# Patient Record
Sex: Female | Born: 1955
Health system: Southern US, Community
[De-identification: ages and names within clinical notes are randomized; demographics above are authoritative.]

## PROBLEM LIST (undated history)

## (undated) DIAGNOSIS — K760 Fatty (change of) liver, not elsewhere classified: Secondary | ICD-10-CM

## (undated) DIAGNOSIS — Z9889 Other specified postprocedural states: Secondary | ICD-10-CM

## (undated) DIAGNOSIS — E669 Obesity, unspecified: Secondary | ICD-10-CM

## (undated) DIAGNOSIS — K2970 Gastritis, unspecified, without bleeding: Secondary | ICD-10-CM

## (undated) DIAGNOSIS — E119 Type 2 diabetes mellitus without complications: Secondary | ICD-10-CM

## (undated) DIAGNOSIS — K859 Acute pancreatitis without necrosis or infection, unspecified: Secondary | ICD-10-CM

## (undated) DIAGNOSIS — F32A Depression, unspecified: Secondary | ICD-10-CM

## (undated) DIAGNOSIS — M069 Rheumatoid arthritis, unspecified: Secondary | ICD-10-CM

## (undated) DIAGNOSIS — I10 Essential (primary) hypertension: Secondary | ICD-10-CM

## (undated) DIAGNOSIS — G629 Polyneuropathy, unspecified: Secondary | ICD-10-CM

## (undated) DIAGNOSIS — K3184 Gastroparesis: Secondary | ICD-10-CM

## (undated) DIAGNOSIS — K219 Gastro-esophageal reflux disease without esophagitis: Secondary | ICD-10-CM

## (undated) DIAGNOSIS — F329 Major depressive disorder, single episode, unspecified: Secondary | ICD-10-CM

## (undated) DIAGNOSIS — I639 Cerebral infarction, unspecified: Secondary | ICD-10-CM

## (undated) DIAGNOSIS — Z8673 Personal history of transient ischemic attack (TIA), and cerebral infarction without residual deficits: Secondary | ICD-10-CM

## (undated) DIAGNOSIS — F419 Anxiety disorder, unspecified: Secondary | ICD-10-CM

## (undated) DIAGNOSIS — K449 Diaphragmatic hernia without obstruction or gangrene: Secondary | ICD-10-CM

## (undated) DIAGNOSIS — E785 Hyperlipidemia, unspecified: Secondary | ICD-10-CM

## (undated) DIAGNOSIS — M76829 Posterior tibial tendinitis, unspecified leg: Secondary | ICD-10-CM

## (undated) DIAGNOSIS — Z87442 Personal history of urinary calculi: Secondary | ICD-10-CM

## (undated) HISTORY — DX: Diaphragmatic hernia without obstruction or gangrene: K44.9

## (undated) HISTORY — DX: Obesity, unspecified: E66.9

## (undated) HISTORY — DX: Hyperlipidemia, unspecified: E78.5

## (undated) HISTORY — PX: LITHOTRIPSY: SUR834

## (undated) HISTORY — DX: Gastritis, unspecified, without bleeding: K29.70

## (undated) HISTORY — PX: CHOLECYSTECTOMY: SHX55

## (undated) HISTORY — DX: Type 2 diabetes mellitus without complications: E11.9

## (undated) HISTORY — DX: Cerebral infarction, unspecified: I63.9

## (undated) HISTORY — PX: TUBAL LIGATION: SHX77

## (undated) HISTORY — DX: Personal history of transient ischemic attack (TIA), and cerebral infarction without residual deficits: Z86.73

## (undated) HISTORY — DX: Essential (primary) hypertension: I10

## (undated) HISTORY — DX: Acute pancreatitis without necrosis or infection, unspecified: K85.90

## (undated) HISTORY — DX: Major depressive disorder, single episode, unspecified: F32.9

## (undated) HISTORY — DX: Posterior tibial tendinitis, unspecified leg: M76.829

## (undated) HISTORY — DX: Gastroparesis: K31.84

## (undated) HISTORY — DX: Depression, unspecified: F32.A

## (undated) HISTORY — DX: Gastro-esophageal reflux disease without esophagitis: K21.9

## (undated) HISTORY — PX: CARPAL TUNNEL RELEASE: SHX101

## (undated) HISTORY — DX: Fatty (change of) liver, not elsewhere classified: K76.0

## (undated) HISTORY — PX: ANKLE SURGERY: SHX546

## (undated) HISTORY — PX: DILATION AND CURETTAGE OF UTERUS: SHX78

## (undated) HISTORY — PX: LASER ABLATION OF THE CERVIX: SHX1949

## (undated) HISTORY — DX: Anxiety disorder, unspecified: F41.9

## (undated) HISTORY — DX: Rheumatoid arthritis, unspecified: M06.9

## (undated) HISTORY — PX: FINGER SURGERY: SHX640

## (undated) HISTORY — DX: Other specified postprocedural states: Z98.890

---

## 2001-02-18 ENCOUNTER — Ambulatory Visit (HOSPITAL_BASED_OUTPATIENT_CLINIC_OR_DEPARTMENT_OTHER): Admission: RE | Admit: 2001-02-18 | Discharge: 2001-02-18 | Payer: Self-pay | Admitting: Orthopedic Surgery

## 2001-07-15 ENCOUNTER — Encounter: Payer: Self-pay | Admitting: Family Medicine

## 2001-07-15 ENCOUNTER — Ambulatory Visit (HOSPITAL_COMMUNITY): Admission: RE | Admit: 2001-07-15 | Discharge: 2001-07-15 | Payer: Self-pay | Admitting: Family Medicine

## 2001-08-15 ENCOUNTER — Ambulatory Visit (HOSPITAL_COMMUNITY): Admission: RE | Admit: 2001-08-15 | Discharge: 2001-08-15 | Payer: Self-pay | Admitting: *Deleted

## 2002-02-26 ENCOUNTER — Other Ambulatory Visit: Admission: RE | Admit: 2002-02-26 | Discharge: 2002-02-26 | Payer: Self-pay | Admitting: *Deleted

## 2002-09-04 ENCOUNTER — Ambulatory Visit (HOSPITAL_COMMUNITY): Admission: RE | Admit: 2002-09-04 | Discharge: 2002-09-04 | Payer: Self-pay | Admitting: Family Medicine

## 2002-09-04 ENCOUNTER — Encounter: Payer: Self-pay | Admitting: Family Medicine

## 2002-10-05 ENCOUNTER — Observation Stay (HOSPITAL_COMMUNITY): Admission: EM | Admit: 2002-10-05 | Discharge: 2002-10-06 | Payer: Self-pay | Admitting: Cardiology

## 2002-10-05 ENCOUNTER — Encounter: Payer: Self-pay | Admitting: Emergency Medicine

## 2003-05-18 LAB — HM DIABETES EYE EXAM

## 2003-06-18 LAB — HM DIABETES EYE EXAM

## 2003-09-06 ENCOUNTER — Encounter: Payer: Self-pay | Admitting: Family Medicine

## 2003-09-06 ENCOUNTER — Ambulatory Visit (HOSPITAL_COMMUNITY): Admission: RE | Admit: 2003-09-06 | Discharge: 2003-09-06 | Payer: Self-pay | Admitting: Family Medicine

## 2003-09-16 ENCOUNTER — Ambulatory Visit (HOSPITAL_COMMUNITY): Admission: RE | Admit: 2003-09-16 | Discharge: 2003-09-16 | Payer: Self-pay | Admitting: Family Medicine

## 2004-07-24 ENCOUNTER — Encounter (INDEPENDENT_AMBULATORY_CARE_PROVIDER_SITE_OTHER): Payer: Self-pay | Admitting: *Deleted

## 2004-07-24 ENCOUNTER — Emergency Department (HOSPITAL_COMMUNITY): Admission: EM | Admit: 2004-07-24 | Discharge: 2004-07-25 | Payer: Self-pay | Admitting: Emergency Medicine

## 2005-08-27 ENCOUNTER — Ambulatory Visit (HOSPITAL_COMMUNITY): Admission: RE | Admit: 2005-08-27 | Discharge: 2005-08-27 | Payer: Self-pay | Admitting: Family Medicine

## 2005-12-20 ENCOUNTER — Ambulatory Visit (HOSPITAL_COMMUNITY): Admission: RE | Admit: 2005-12-20 | Discharge: 2005-12-20 | Payer: Self-pay | Admitting: Family Medicine

## 2005-12-20 ENCOUNTER — Encounter (INDEPENDENT_AMBULATORY_CARE_PROVIDER_SITE_OTHER): Payer: Self-pay | Admitting: *Deleted

## 2006-09-16 ENCOUNTER — Ambulatory Visit (HOSPITAL_COMMUNITY): Admission: RE | Admit: 2006-09-16 | Discharge: 2006-09-16 | Payer: Self-pay | Admitting: Family Medicine

## 2006-11-19 DIAGNOSIS — Z8673 Personal history of transient ischemic attack (TIA), and cerebral infarction without residual deficits: Secondary | ICD-10-CM

## 2006-11-19 DIAGNOSIS — I639 Cerebral infarction, unspecified: Secondary | ICD-10-CM

## 2006-11-19 HISTORY — DX: Personal history of transient ischemic attack (TIA), and cerebral infarction without residual deficits: Z86.73

## 2006-11-19 HISTORY — DX: Cerebral infarction, unspecified: I63.9

## 2006-12-27 ENCOUNTER — Ambulatory Visit (HOSPITAL_COMMUNITY): Admission: RE | Admit: 2006-12-27 | Discharge: 2006-12-27 | Payer: Self-pay | Admitting: Obstetrics & Gynecology

## 2007-04-15 ENCOUNTER — Ambulatory Visit (HOSPITAL_COMMUNITY): Admission: RE | Admit: 2007-04-15 | Discharge: 2007-04-15 | Payer: Self-pay | Admitting: Family Medicine

## 2007-04-21 ENCOUNTER — Ambulatory Visit (HOSPITAL_COMMUNITY): Admission: RE | Admit: 2007-04-21 | Discharge: 2007-04-21 | Payer: Self-pay | Admitting: Family Medicine

## 2008-06-17 ENCOUNTER — Ambulatory Visit (HOSPITAL_COMMUNITY): Admission: RE | Admit: 2008-06-17 | Discharge: 2008-06-17 | Payer: Self-pay | Admitting: Family Medicine

## 2008-06-17 ENCOUNTER — Encounter: Payer: Self-pay | Admitting: Orthopedic Surgery

## 2008-07-12 ENCOUNTER — Ambulatory Visit: Payer: Self-pay | Admitting: Orthopedic Surgery

## 2008-07-12 DIAGNOSIS — M76829 Posterior tibial tendinitis, unspecified leg: Secondary | ICD-10-CM | POA: Insufficient documentation

## 2008-07-15 ENCOUNTER — Encounter: Payer: Self-pay | Admitting: Orthopedic Surgery

## 2008-07-15 ENCOUNTER — Encounter (HOSPITAL_COMMUNITY): Admission: RE | Admit: 2008-07-15 | Discharge: 2008-08-14 | Payer: Self-pay | Admitting: Orthopedic Surgery

## 2008-08-20 ENCOUNTER — Ambulatory Visit (HOSPITAL_COMMUNITY): Admission: RE | Admit: 2008-08-20 | Discharge: 2008-08-20 | Payer: Self-pay | Admitting: Family Medicine

## 2008-12-01 ENCOUNTER — Encounter: Payer: Self-pay | Admitting: Orthopedic Surgery

## 2009-03-16 ENCOUNTER — Ambulatory Visit (HOSPITAL_COMMUNITY): Admission: RE | Admit: 2009-03-16 | Discharge: 2009-03-16 | Payer: Self-pay | Admitting: Family Medicine

## 2009-04-16 ENCOUNTER — Emergency Department (HOSPITAL_COMMUNITY): Admission: EM | Admit: 2009-04-16 | Discharge: 2009-04-16 | Payer: Self-pay | Admitting: Family Medicine

## 2009-04-27 ENCOUNTER — Encounter: Payer: Self-pay | Admitting: Internal Medicine

## 2009-04-27 ENCOUNTER — Ambulatory Visit (HOSPITAL_COMMUNITY): Admission: RE | Admit: 2009-04-27 | Discharge: 2009-04-27 | Payer: Self-pay | Admitting: Family Medicine

## 2009-06-10 ENCOUNTER — Encounter: Payer: Self-pay | Admitting: Internal Medicine

## 2009-07-15 DIAGNOSIS — F411 Generalized anxiety disorder: Secondary | ICD-10-CM | POA: Insufficient documentation

## 2009-07-15 DIAGNOSIS — K760 Fatty (change of) liver, not elsewhere classified: Secondary | ICD-10-CM | POA: Insufficient documentation

## 2009-07-15 DIAGNOSIS — J45909 Unspecified asthma, uncomplicated: Secondary | ICD-10-CM

## 2009-07-15 DIAGNOSIS — K449 Diaphragmatic hernia without obstruction or gangrene: Secondary | ICD-10-CM | POA: Insufficient documentation

## 2009-07-15 DIAGNOSIS — Z8719 Personal history of other diseases of the digestive system: Secondary | ICD-10-CM | POA: Insufficient documentation

## 2009-07-15 DIAGNOSIS — E1159 Type 2 diabetes mellitus with other circulatory complications: Secondary | ICD-10-CM | POA: Insufficient documentation

## 2009-07-15 DIAGNOSIS — F419 Anxiety disorder, unspecified: Secondary | ICD-10-CM | POA: Insufficient documentation

## 2009-07-15 DIAGNOSIS — E785 Hyperlipidemia, unspecified: Secondary | ICD-10-CM | POA: Insufficient documentation

## 2009-07-15 DIAGNOSIS — R1310 Dysphagia, unspecified: Secondary | ICD-10-CM | POA: Insufficient documentation

## 2009-07-15 DIAGNOSIS — I10 Essential (primary) hypertension: Secondary | ICD-10-CM | POA: Insufficient documentation

## 2009-07-15 DIAGNOSIS — F329 Major depressive disorder, single episode, unspecified: Secondary | ICD-10-CM

## 2009-07-19 ENCOUNTER — Ambulatory Visit: Payer: Self-pay | Admitting: Internal Medicine

## 2009-07-19 DIAGNOSIS — E1143 Type 2 diabetes mellitus with diabetic autonomic (poly)neuropathy: Secondary | ICD-10-CM | POA: Insufficient documentation

## 2009-07-19 DIAGNOSIS — K3184 Gastroparesis: Secondary | ICD-10-CM | POA: Insufficient documentation

## 2009-07-27 ENCOUNTER — Ambulatory Visit (HOSPITAL_COMMUNITY): Admission: RE | Admit: 2009-07-27 | Discharge: 2009-07-27 | Payer: Self-pay | Admitting: Internal Medicine

## 2009-07-27 HISTORY — PX: OTHER SURGICAL HISTORY: SHX169

## 2009-07-29 ENCOUNTER — Ambulatory Visit: Payer: Self-pay | Admitting: Internal Medicine

## 2009-07-29 ENCOUNTER — Encounter: Payer: Self-pay | Admitting: Internal Medicine

## 2009-07-29 HISTORY — PX: ESOPHAGOGASTRODUODENOSCOPY: SHX1529

## 2009-08-01 ENCOUNTER — Encounter: Payer: Self-pay | Admitting: Internal Medicine

## 2009-08-24 ENCOUNTER — Ambulatory Visit (HOSPITAL_COMMUNITY): Admission: RE | Admit: 2009-08-24 | Discharge: 2009-08-24 | Payer: Self-pay | Admitting: Family Medicine

## 2009-09-09 ENCOUNTER — Emergency Department (HOSPITAL_COMMUNITY): Admission: EM | Admit: 2009-09-09 | Discharge: 2009-09-09 | Payer: Self-pay | Admitting: Emergency Medicine

## 2009-09-28 ENCOUNTER — Ambulatory Visit: Payer: Self-pay | Admitting: Orthopedic Surgery

## 2009-09-28 DIAGNOSIS — M7150 Other bursitis, not elsewhere classified, unspecified site: Secondary | ICD-10-CM

## 2009-09-29 ENCOUNTER — Encounter: Payer: Self-pay | Admitting: Orthopedic Surgery

## 2009-12-20 ENCOUNTER — Ambulatory Visit: Admission: RE | Admit: 2009-12-20 | Discharge: 2009-12-20 | Payer: Self-pay | Admitting: Family Medicine

## 2010-01-18 ENCOUNTER — Encounter: Admission: RE | Admit: 2010-01-18 | Discharge: 2010-04-18 | Payer: Self-pay | Admitting: Family Medicine

## 2010-01-26 ENCOUNTER — Encounter: Admission: RE | Admit: 2010-01-26 | Discharge: 2010-01-26 | Payer: Self-pay | Admitting: Endocrinology

## 2010-07-19 ENCOUNTER — Ambulatory Visit: Payer: Self-pay | Admitting: Orthopedic Surgery

## 2010-07-19 DIAGNOSIS — M6530 Trigger finger, unspecified finger: Secondary | ICD-10-CM | POA: Insufficient documentation

## 2010-07-20 ENCOUNTER — Encounter: Payer: Self-pay | Admitting: Orthopedic Surgery

## 2010-08-17 ENCOUNTER — Emergency Department (HOSPITAL_COMMUNITY): Admission: EM | Admit: 2010-08-17 | Discharge: 2010-08-17 | Payer: Self-pay | Admitting: Family Medicine

## 2010-08-28 ENCOUNTER — Ambulatory Visit (HOSPITAL_COMMUNITY): Admission: RE | Admit: 2010-08-28 | Discharge: 2010-08-28 | Payer: Self-pay | Admitting: Family Medicine

## 2010-11-04 ENCOUNTER — Emergency Department (HOSPITAL_COMMUNITY)
Admission: EM | Admit: 2010-11-04 | Discharge: 2010-11-04 | Payer: Self-pay | Source: Home / Self Care | Admitting: Emergency Medicine

## 2010-12-09 ENCOUNTER — Encounter: Payer: Self-pay | Admitting: Family Medicine

## 2010-12-10 ENCOUNTER — Encounter: Payer: Self-pay | Admitting: Family Medicine

## 2010-12-18 ENCOUNTER — Encounter: Payer: Self-pay | Admitting: Cardiology

## 2010-12-18 ENCOUNTER — Ambulatory Visit (HOSPITAL_COMMUNITY)
Admission: RE | Admit: 2010-12-18 | Discharge: 2010-12-18 | Payer: Self-pay | Source: Home / Self Care | Attending: Family Medicine | Admitting: Family Medicine

## 2010-12-18 ENCOUNTER — Ambulatory Visit: Admit: 2010-12-18 | Payer: Self-pay | Admitting: Endocrinology

## 2010-12-19 ENCOUNTER — Encounter: Payer: Self-pay | Admitting: Cardiology

## 2010-12-19 ENCOUNTER — Ambulatory Visit
Admission: RE | Admit: 2010-12-19 | Discharge: 2010-12-19 | Payer: Self-pay | Source: Home / Self Care | Attending: Cardiology | Admitting: Cardiology

## 2010-12-21 NOTE — Letter (Signed)
Summary: Medication list  Medication list   Imported By: Jacklynn Ganong 07/20/2010 14:20:20  _____________________________________________________________________  External Attachment:    Type:   Image     Comment:   External Document

## 2010-12-21 NOTE — Assessment & Plan Note (Signed)
Summary: req injection left middle trigger finger/umr/bsf   Visit Type:  Follow-up Referring Provider:  [name], MD Primary Provider:  [name], MD  CC:  trigger finger left middle finger.  History of Present Illness: I saw Jamie Harding in the office today for a followup visit.  She is a 55 years old woman with the complaint of:  left middle finger, trigger.treated last year for triggering with injection.  Presents back with pain in triggering of the LEFT long finger.  Tenderness is noted over the A1 pulley with painful range of motion.  We injected the area of the A1 pulley.  You have received an injection of cortisone today. You may experience increased pain at the injection site. Apply ice pack to the area for 20 minutes every 2 hours and take 2 xtra strength tylenol every 8 hours. This increased pain will usually resolve in 24 hours. The injection will take effect in 3-10 days.    Allergies: 1)  ! Morphine 2)  ! Sulfa 3)  ! Zithromax 4)  ! Naprosyn   Impression & Recommendations:  Problem # 1:  TRIGGER FINGER (ICD-727.3) Assessment Deteriorated  Other Orders: Est. Patient Level III (60454) Injection, Tendon / Ligament (09811) Depo- Medrol 40mg  (J1030)  Patient Instructions: 1)  You have received an injection of cortisone today. You may experience increased pain at the injection site. Apply ice pack to the area for 20 minutes every 2 hours and take 2 xtra strength tylenol every 8 hours. This increased pain will usually resolve in 24 hours. The injection will take effect in 3-10 days. ' 2)  Please schedule a follow-up appointment as needed.

## 2010-12-26 ENCOUNTER — Telehealth (INDEPENDENT_AMBULATORY_CARE_PROVIDER_SITE_OTHER): Payer: Self-pay | Admitting: *Deleted

## 2010-12-27 ENCOUNTER — Ambulatory Visit (HOSPITAL_BASED_OUTPATIENT_CLINIC_OR_DEPARTMENT_OTHER): Payer: 59

## 2010-12-27 ENCOUNTER — Ambulatory Visit (HOSPITAL_COMMUNITY): Payer: 59 | Attending: Internal Medicine

## 2010-12-27 ENCOUNTER — Encounter: Payer: Self-pay | Admitting: Internal Medicine

## 2010-12-27 DIAGNOSIS — R072 Precordial pain: Secondary | ICD-10-CM

## 2010-12-27 DIAGNOSIS — R0602 Shortness of breath: Secondary | ICD-10-CM

## 2010-12-27 DIAGNOSIS — R079 Chest pain, unspecified: Secondary | ICD-10-CM | POA: Insufficient documentation

## 2010-12-27 NOTE — Assessment & Plan Note (Signed)
Summary: np6   Visit Type:  Initial Consult Referring Provider:  [name], MD Primary Provider:  [name], MD  CC:  chest pain.  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  When she is working she feels fine.  She is overweight.  She has been working at [employer] and increasing her exercise.  She has some shortness of breath with this, but no chest pain.  The patient has had some GI symptoms in the past.  Dr. Lina Sar for GI evaluation.  Her emptying study did not show significant gastroparesis.  Endoscopy only showed minimal gastritis.  The etiology of her symptoms in the past were not clear. This workup was done in September, 2010.  Over time she felt somewhat better.  More recently she has had more difficulties.  She now has symptoms that occur at nighttime.  She describes an episode of awakening with nausea and sweating.  She may then feel some increased heart rate and some chest pain.  She uses Phenergan.  She frequently has vomited.  She had a fairly significant episode in the past week.  She is now referred for further cardiac evaluation to be sure that there is not a cardiac component to these symptoms.  There is a history of hypertension and diabetes. She is significantly overweight.  She says that she had some type of stroke in 2008.  There is a family history with a sibling having a coronary stent at a young age.  Current Medications (verified): 1)  Nexium 40 Mg Cpdr (Esomeprazole Magnesium) .... Take 1 Tablet By Mouth Once A Day 2)  Alprazolam 0.5 Mg Tabs (Alprazolam) .... One Tablet By Mouth Two Times A Day As Needed 3)  Lisinopril 20 Mg Tabs (Lisinopril) .... One Tablet By Mouth Once Daily 4)  Pravastatin Sodium 80 Mg Tabs (Pravastatin Sodium) .... Take One Tablet By Mouth Daily At Bedtime 5)  Glyburide Micronized 6 Mg Tabs (Glyburide Micronized) .... One Tablet By Mouth Two Times A Day 6)   Metformin Hcl 1000 Mg Tabs (Metformin Hcl) .... One Tablet By Mouth Two Times A Day 7)  Furosemide 20 Mg Tabs (Furosemide) .... One Tablet By Mouth Once Daily 8)  Aspirin Ec 325 Mg Tbec (Aspirin) .... Take One Tablet By Mouth Daily 9)  Multivitamins   Tabs (Multiple Vitamin) .... One Tablet By Mouth Daily 10)  Promethazine Hcl 25 Mg Tabs (Promethazine Hcl) .... One Tablet By Mouth Two Times A Day As Needed 11)  Lantus 100 Unit/ml Soln (Insulin Glargine) .... 25 Units At At Bedtime 12)  Methocarbamol 500 Mg Tabs (Methocarbamol) .... Take 1 Tablet By Mouth Once A Day 13)  Ondansetron Hcl 8 Mg Tabs (Ondansetron Hcl) .... Take 1 Tablet By Mouth Once A Day 14)  Amlodipine Besylate 5 Mg Tabs (Amlodipine Besylate) .... Take One Tablet By Mouth Daily 15)  Novolog 100 Unit/ml Soln (Insulin Aspart) .... Ssi, Three Times A Day With Meals 16)  Venlafaxine Hcl 75 Mg Tabs (Venlafaxine Hcl) .... Take 1 Tablet By Mouth Once A Day  Allergies (verified): 1)  ! Morphine 2)  ! Sulfa 3)  ! Zithromax 4)  ! Naprosyn  Past History:  Past Medical History: diabetes stroke htn obese depression anxiety HYPERLIPIDEMIA HIATAL HERNIA  ASTHMA, UNSPECIFIED DYSPHAGIA UNSPECIFIED FATTY LIVER DISEASE GASTRITIS, HX OF PANCREATITIS, HX OF TIBIALIS TENDINITIS  HX, FAMILY, ASTHMA FAMILY HISTORY OF ARTHRITIS  FAMILY HISTORY CORONARY HEART  DISEASE FEMALE < 65  FAMILY HISTORY OF DIABETES Chest pain, nausea, vomiting, sweating,    January, 2012... episodes occur in the middle of the night  Family History: Reviewed history from 07/19/2009 and no changes required. FH of Cancer: Breast Cancer: Mother  Family History of Diabetes: Father Family History Coronary Heart Disease female < 10 Family History of Arthritis Hx, family, asthma No FH of Colon Cancer: Family History of Diabetes: Sister   Social History: Reviewed history from 07/19/2009 and no changes required. Patient is married.  nurse tech Illicit Drug  Use - no Patient is a former smoker.  Alcohol Use - no Daily Caffeine Use: 2 daily  Patient does not get regular exercise.   Review of Systems       Patient denies fever, chills, headache, change in vision, change in hearing, cough, urinary symptoms.  All other systems are reviewed and are negative.  Vital Signs:  Patient profile:   55 year old female Weight:      235 pounds BMI:     43.14 Pulse rate:   68 / minute BP sitting:   138 / 84  (left arm)  Vitals Entered By: Hardin Negus, RMA (December 19, 2010 2:06 PM)  Physical Exam  General:  patient is overweight. She is stable today and not having any symptoms at this time. Head:  head is atraumatic. Eyes:  no xanthelasma. Neck:  no jugular venous distention. Chest Wall:  no chest wall tenderness. Lungs:  lungs are clear.  Respiratory effort is nonlabored. Heart:  cardiac exam reveals S1-S2.  No clicks or significant murmurs. Abdomen:  abdomen is soft. Msk:  no musculoskeletal deformities. Extremities:  no peripheral edema. Skin:  no skin rash. Psych:  patient is oriented to person time and place.  Affect is normal.   Impression & Recommendations:  Problem # 1:  * CHEST PAIN, NAUSEA, VOMITING, SWEATING  The etiology of these episodes is not clear.  The symptoms certainly are concerning.  They occur in the middle of night.  She does not have exertional symptoms.  The possibility of rest ischemia will be kept in mind, but there is no definite proof.  She has many risk factors.  EKG is done today and reviewed by me.  It is normal.  The patient is on aspirin.  I decided not to change her other medications.  We will proceed with an echo to fully assess LV function and valvular function and a pharmacologic stress nuclear scan.  In the meantime she will continue full activities.  I will then see her back for followup.  We will give her sublingual nitroglycerin to try with one of these episodes this evening has any  effect.  Orders: Nuclear Stress Test (Nuc Stress Test) Echocardiogram (Echo)  Problem # 2:  HYPERLIPIDEMIA (ICD-272.4) Lipids are being treated.  Problem # 3:  HYPERTENSION (ICD-401.9) Patient's blood pressure is being treated.  No change in therapy.  Other Orders: EKG w/ Interpretation (93000)  Patient Instructions: 1)  Your physician has requested that you have an echocardiogram.  Echocardiography is a painless test that uses sound waves to create images of your heart. It provides your doctor with information about the size and shape of your heart and how well your heart's chambers and valves are working.  This procedure takes approximately one hour. There are no restrictions for this procedure. 2)  Your physician has requested that you have a Lexiscan myoview.  For further information please visit [email or web address]  Please  follow instruction sheet, as given. 3)  Follow up in 1 week

## 2011-01-01 ENCOUNTER — Encounter: Payer: Self-pay | Admitting: Cardiology

## 2011-01-01 ENCOUNTER — Ambulatory Visit (INDEPENDENT_AMBULATORY_CARE_PROVIDER_SITE_OTHER): Payer: 59 | Admitting: Cardiology

## 2011-01-01 ENCOUNTER — Ambulatory Visit (HOSPITAL_COMMUNITY): Payer: 59 | Attending: Cardiology

## 2011-01-01 DIAGNOSIS — R0989 Other specified symptoms and signs involving the circulatory and respiratory systems: Secondary | ICD-10-CM

## 2011-01-01 DIAGNOSIS — R072 Precordial pain: Secondary | ICD-10-CM

## 2011-01-02 ENCOUNTER — Ambulatory Visit: Payer: Self-pay | Admitting: Endocrinology

## 2011-01-04 NOTE — Progress Notes (Signed)
Summary: nuc pre procedure  Phone Note Outgoing Call Call back at cell [phone]   Call placed by: Cathlyn Parsons RN,  December 26, 2010 2:36 PM Call placed to: Patient Reason for Call: Confirm/change Appt Summary of Call: Left message with information on Myoview Information Sheet (see scanned document for details).      Nuclear Med Background Indications for Stress Test: Evaluation for Ischemia   History: Asthma  History Comments: ? Chemo  Symptoms: Chest Pain, Diaphoresis, DOE, Nausea, Rapid HR, Vomiting  Symptoms Comments: CP during the night.   Nuclear Pre-Procedure Cardiac Risk Factors: Carotid Disease, CVA, Family History - CAD, Hypertension, IDDM Type 2, Lipids, Obesity Height (in): 62

## 2011-01-10 NOTE — Assessment & Plan Note (Addendum)
Summary: Cardiology Nuclear Testing  Nuclear Med Background Indications for Stress Test: Evaluation for Ischemia   History: Asthma  History Comments: ? Chemo  Symptoms: Chest Pain, Diaphoresis, DOE, Nausea, Palpitations, Rapid HR, Vomiting  Symptoms Comments: CP during the night.   Nuclear Pre-Procedure Cardiac Risk Factors: Carotid Disease, CVA, Family History - CAD, Hypertension, IDDM Type 2, Lipids, Obesity Caffeine/Decaff Intake: none NPO After: 10:30 PM Lungs: clear IV 0.9% NS with Angio Cath: 22g     IV Site: L Wrist IV Started by: Milana Na, EMT-P Chest Size (in) 44     Cup Size DD     Height (in): 62 Weight (lb): 235 BMI: 43.14 Tech Comments: CBG 125mg /dl  Nuclear Med Study 1 or 2 day study:  2 day     Stress Test Type:  Eugenie Birks Reading MD:  Arvilla Meres, MD     Referring MD:  J.Katz Resting Radionuclide:  Technetium 30m Tetrofosmin     Resting Radionuclide Dose:  33.0 mCi  Stress Radionuclide:  Technetium 53m Tetrofosmin     Stress Radionuclide Dose:  33.0 mCi   Stress Protocol  Max Systolic BP: 151 mm Hg Lexiscan: 0.4 mg   Stress Test Technologist:  Milana Na, EMT-P     Nuclear Technologist:  Doyne Keel, CNMT  Rest Procedure  Myocardial perfusion imaging was performed at rest 45 minutes following the intravenous administration of Technetium 13m Tetrofosmin.  Stress Procedure  The patient received IV Lexiscan 0.4 mg over 15-seconds.  Technetium 19m Tetrofosmin injected at 30-seconds.  There were non specific changes and occ pvcs with infusion.  Quantitative spect images were obtained after a 45 minute delay.  QPS Raw Data Images:  There is a breast shadow that accounts for the anterior attenuation. Stress Images:  There is decreased uptake in the anterior wall. Rest Images:  There is decreased uptake in the anterior wall. Subtraction (SDS):  There is a primarily fixed anterior defect that is most consistent with breast  attenuation. Transient Ischemic Dilatation:  0.91  (Normal <1.22)  Lung/Heart Ratio:  0.34  (Normal <0.45)  Quantitative Gated Spect Images QGS EDV:  72 ml QGS ESV:  18 ml QGS EF:  74 % QGS cine images:  Normal  Findings Normal nuclear study      Overall Impression  Exercise Capacity: Lexiscan with no exercise. ECG Impression: No significant ST segment change with Lexiscan. Overall Impression: Normal stress nuclear study. Overall Impression Comments: There is a primarily fixed anterior defect that is most consistent with breast attenuation which is very prominent on raw images.  Appended Document: Cardiology Nuclear Testing pt aware at [facility] 2/13

## 2011-01-10 NOTE — Assessment & Plan Note (Signed)
Summary: f1wk ok per heather/ f/u stress/echo/saf   Visit Type:  Follow-up Referring Provider:  [name], MD Primary Provider:  [name], MD  CC:  chest pain.  History of Present Illness: The patient returns for followup of her symptoms that are complex.  He continues to have spells that occur during the night with nausea.  Sometimes there is sweating and vomiting.  She then has chest discomfort.  This can then persist for several hours and the day. Patient has had an attack of unclear study since her last visit.  The echo reveals normal left ventricular function.  There are no significant valvular abnormalities.  Her nuclear scan reveals significant breast attenuation but there is no definite scar or ischemia.  Current Medications (verified): 1)  Nexium 40 Mg Cpdr (Esomeprazole Magnesium) .... Take 1 Tablet By Mouth Once A Day 2)  Alprazolam 0.5 Mg Tabs (Alprazolam) .... One Tablet By Mouth Two Times A Day As Needed 3)  Lisinopril 20 Mg Tabs (Lisinopril) .... One Tablet By Mouth Once Daily 4)  Pravastatin Sodium 80 Mg Tabs (Pravastatin Sodium) .... Take One Tablet By Mouth Daily At Bedtime 5)  Glyburide Micronized 6 Mg Tabs (Glyburide Micronized) .... One Tablet By Mouth Two Times A Day 6)  Metformin Hcl 1000 Mg Tabs (Metformin Hcl) .... One Tablet By Mouth Two Times A Day 7)  Furosemide 20 Mg Tabs (Furosemide) .... One Tablet By Mouth Once Daily 8)  Aspirin Ec 325 Mg Tbec (Aspirin) .... Take One Tablet By Mouth Daily 9)  Multivitamins   Tabs (Multiple Vitamin) .... One Tablet By Mouth Daily 10)  Promethazine Hcl 25 Mg Tabs (Promethazine Hcl) .... One Tablet By Mouth Two Times A Day As Needed 11)  Lantus 100 Unit/ml Soln (Insulin Glargine) .... 25 Units At At Bedtime 12)  Methocarbamol 500 Mg Tabs (Methocarbamol) .... Take 1 Tablet By Mouth Once A Day 13)  Ondansetron Hcl 8 Mg Tabs (Ondansetron Hcl) .... Take 1 Tablet By Mouth Once A Day 14)  Amlodipine Besylate 5 Mg Tabs  (Amlodipine Besylate) .... Take One Tablet By Mouth Daily 15)  Novolog 100 Unit/ml Soln (Insulin Aspart) .... Ssi, Three Times A Day With Meals 16)  Venlafaxine Hcl 75 Mg Tabs (Venlafaxine Hcl) .... Take 1 Tablet By Mouth Once A Day  Allergies (verified): 1)  ! Morphine 2)  ! Sulfa 3)  ! Zithromax 4)  ! Naprosyn  Past History:  Past Medical History: diabetes stroke htn obese depression anxiety HYPERLIPIDEMIA HIATAL HERNIA  ASTHMA, UNSPECIFIED DYSPHAGIA UNSPECIFIED FATTY LIVER DISEASE GASTRITIS, HX OF PANCREATITIS, HX OF TIBIALIS TENDINITIS  HX, FAMILY, ASTHMA FAMILY HISTORY OF ARTHRITIS  FAMILY HISTORY CORONARY HEART DISEASE FEMALE < 65  FAMILY HISTORY OF DIABETES Chest pain, nausea, vomiting, sweating,    January, 2012... episodes occur in the middle of the night  /  nuclear February 13,2012...Marland KitchenMarland KitchenMarland Kitchenbreast attenuation.... no definite scar or ischemia. EF 60-65%.... echo... February, 2012 [facility]    mild.... echo... February, 2012 are  Review of Systems       Patient denies fever, chills, headache, sweats, rash, change in vision, change in hearing, urinary symptoms.  All other systems are reviewed and are negative.  Vital Signs:  Patient profile:   55 year old female Height:      62 inches Weight:      239 pounds BMI:     43.87 Pulse rate:   85 / minute BP sitting:   132 / 76  (left arm) Cuff size:  regular  Vitals Entered By: Hardin Negus, RMA (January 01, 2011 2:06 PM)  Physical Exam  General:  patient is stable today but has some mild residual chest discomfort. Eyes:  no xanthelasma. Neck:  no jugular venous distention. Chest Wall:  there is no chest wall tenderness. Lungs:  lungs are clear.  Respiratory effort is not labored. Heart:  cardiac exam reveals S1-S2.  No clicks or significant murmurs. Abdomen:  abdomen is soft. Extremities:  no peripheral edema. Psych:  patient is oriented to person time and place.  Affect is normal.   Impression &  Recommendations:  Problem # 1:  * CHEST PAIN, NAUSEA, VOMITING, SWEATING The patient continues to have episodes with nausea in the middle of the night.  At this point there is no proof of cardiac ischemia.  I am concerned about her symptoms.   I have set heronsidered  proceeding with cardiac catheterization. However I'm not convinced that this is the best first approach.  She has seen Dr. Juanda Chance for GI evaluation in 2010.  I will be arranging for fairly early followup in GI to see if Dr.                  Juanda Chance feels that any other GI study should be done at this time..  Problem # 2:  HYPERTENSION (ICD-401.9)  Her updated medication list for this problem includes:    Lisinopril 20 Mg Tabs (Lisinopril) ..... One tablet by mouth once daily    Furosemide 20 Mg Tabs (Furosemide) ..... One tablet by mouth once daily    Aspirin Ec 325 Mg Tbec (Aspirin) .Marland Kitchen... Take one tablet by mouth daily    Amlodipine Besylate 5 Mg Tabs (Amlodipine besylate) .Marland Kitchen... Take one tablet by mouth daily Blood pressure is controlled.  No change in therapy.  Other Orders: Gastroenterology Referral (GI)  Patient Instructions: 1)  You have been referred to follow up with Dr Lina Sar 2)  Follow up with Dr Myrtis Ser in 4 weeks

## 2011-01-27 ENCOUNTER — Encounter: Payer: Self-pay | Admitting: Cardiology

## 2011-01-29 LAB — URINE MICROSCOPIC-ADD ON

## 2011-01-29 LAB — URINALYSIS, ROUTINE W REFLEX MICROSCOPIC
Bilirubin Urine: NEGATIVE
Glucose, UA: >1000 mg/dL — AB
Ketones, ur: NEGATIVE mg/dL
Nitrite: NEGATIVE
Protein, ur: NEGATIVE mg/dL
pH: 5.5 (ref 5.0–8.0)

## 2011-01-29 LAB — URINE CULTURE

## 2011-02-01 LAB — POCT URINALYSIS DIPSTICK
Protein, ur: NEGATIVE mg/dL
Specific Gravity, Urine: 1.015 (ref 1.005–1.030)
pH: 5 (ref 5.0–8.0)

## 2011-02-05 ENCOUNTER — Ambulatory Visit: Payer: 59 | Admitting: Internal Medicine

## 2011-02-09 ENCOUNTER — Ambulatory Visit: Payer: 59 | Admitting: Cardiology

## 2011-02-23 LAB — GLUCOSE, CAPILLARY
Glucose-Capillary: 144 mg/dL — ABNORMAL HIGH (ref 70–99)
Glucose-Capillary: 150 mg/dL — ABNORMAL HIGH (ref 70–99)

## 2011-02-27 LAB — COMPREHENSIVE METABOLIC PANEL
Albumin: 3.6 g/dL (ref 3.5–5.2)
Alkaline Phosphatase: 83 U/L (ref 39–117)
BUN: 10 mg/dL (ref 6–23)
Potassium: 4 mEq/L (ref 3.5–5.1)
Sodium: 137 mEq/L (ref 135–145)
Total Protein: 7.4 g/dL (ref 6.0–8.3)

## 2011-02-27 LAB — POCT URINALYSIS DIP (DEVICE)
Bilirubin Urine: NEGATIVE
Glucose, UA: 500 mg/dL — AB
Hgb urine dipstick: NEGATIVE
Nitrite: NEGATIVE

## 2011-02-27 LAB — DIFFERENTIAL
Basophils Relative: 1 % (ref 0–1)
Lymphs Abs: 3 10*3/uL (ref 0.7–4.0)
Monocytes Absolute: 0.4 10*3/uL (ref 0.1–1.0)
Monocytes Relative: 5 % (ref 3–12)
Neutro Abs: 5.8 10*3/uL (ref 1.7–7.7)

## 2011-02-27 LAB — CBC
HCT: 39.2 % (ref 36.0–46.0)
Platelets: 284 10*3/uL (ref 150–400)
RDW: 12.6 % (ref 11.5–15.5)

## 2011-03-05 ENCOUNTER — Encounter: Payer: Self-pay | Admitting: Orthopedic Surgery

## 2011-03-09 ENCOUNTER — Ambulatory Visit: Payer: 59 | Admitting: Cardiology

## 2011-03-21 ENCOUNTER — Ambulatory Visit (HOSPITAL_COMMUNITY)
Admission: RE | Admit: 2011-03-21 | Discharge: 2011-03-21 | Disposition: A | Discharge status: Home / Self Care | Payer: 59 | Source: Ambulatory Visit | Attending: Orthopedic Surgery | Admitting: Orthopedic Surgery

## 2011-03-21 ENCOUNTER — Ambulatory Visit (INDEPENDENT_AMBULATORY_CARE_PROVIDER_SITE_OTHER): Payer: 59 | Admitting: Orthopedic Surgery

## 2011-03-21 ENCOUNTER — Other Ambulatory Visit: Payer: Self-pay | Admitting: Orthopedic Surgery

## 2011-03-21 DIAGNOSIS — M189 Osteoarthritis of first carpometacarpal joint, unspecified: Secondary | ICD-10-CM

## 2011-03-21 DIAGNOSIS — M79609 Pain in unspecified limb: Secondary | ICD-10-CM | POA: Insufficient documentation

## 2011-03-21 DIAGNOSIS — M6530 Trigger finger, unspecified finger: Secondary | ICD-10-CM

## 2011-03-21 DIAGNOSIS — M1810 Unilateral primary osteoarthritis of first carpometacarpal joint, unspecified hand: Secondary | ICD-10-CM

## 2011-03-21 DIAGNOSIS — M79642 Pain in left hand: Secondary | ICD-10-CM

## 2011-03-21 DIAGNOSIS — M19049 Primary osteoarthritis, unspecified hand: Secondary | ICD-10-CM

## 2011-03-21 MED ORDER — METHYLPREDNISOLONE ACETATE 40 MG/ML IJ SUSP: 40.0000 mg | Freq: Once | INTRAMUSCULAR | Status: DC

## 2011-03-21 MED ORDER — NABUMETONE 500 MG PO TABS: 500.0000 mg | ORAL_TABLET | Freq: Two times a day (BID) | ORAL | Status: AC

## 2011-03-21 NOTE — Progress Notes (Signed)
2 complaints. Established patient new problem pain LEFT thumb recurrent problem pain LEFT long finger. Previous trigger finger injection.  The patient has noted increasing pain in the LEFT thumb with associated trouble opening cans jars. Pain is over the base of the thumb. She also notices that the LEFT long finger is having difficulty closing. No triggering or locking.  Past history as recorded and reviewed.  Exam shows a well-developed, well-nourished, female, slightly overweight. She is oriented x3. Her mood and affect are normal. She has no angulatory problems.  She has tenderness over the A1 pulley of the LEFT long finger with decreased range of motion in flexion, as well as extension. No triggering or locking. Skin is intact.  There is tenderness over the base of the thumb with grinding with compression. There is decreased pinch strength. There is decreased range of motion skin is intact.  Pulse and capillary refill of the hand is normal. There is no lymphadenopathy or lymphangitis. Sensation remains intact and normal. Coordination is normal as well.  Impression tenosynovitis of the LEFT long finger and basal joint arthritis of the carpometacarpal joint of the LEFT thumb.  Recommend injection, LEFT long finger for triggering.  Brace and nabumetone for LEFT carpometacarpal joint arthritis.  Follow up 6 weeks.  X-ray will be done at [facility] and patient will be called with findings.

## 2011-03-21 NOTE — Discharge Summary (Signed)
Injection for triggering: LEFT long finger.  Verbal consent.  Time out.  Alcohol prep with ethyl chloride for anesthesia.  Depo-Medrol 40 mg and lidocaine 1% plain 3 cc injected over the A1 pulley.  No complications

## 2011-03-21 NOTE — Patient Instructions (Signed)
You have received a steroid shot. 15% of patients experience increased pain at the injection site with in the next 24 hours. This is best treated with ice and tylenol extra strength 2 tabs every 8 hours. If you are still having pain please call the office.    

## 2011-03-23 ENCOUNTER — Telehealth: Payer: Self-pay | Admitting: Radiology

## 2011-03-23 NOTE — Telephone Encounter (Signed)
Patient was inquiring about her Xray results of her hand. She can be reached Tuesday at work after 3:00 at [phone].

## 2011-04-06 NOTE — Op Note (Signed)
[facility]  Patient:    Jamie Harding, Jamie Harding                      MRN: [id number] Adm. Date:  [id number] Attending:  Ronne Binning                           Operative Report  PREOPERATIVE DIAGNOSIS:  Carpal tunnel syndrome, right hand.  POSTOPERATIVE DIAGNOSIS:  Carpal tunnel syndrome, right hand.  OPERATION:  Decompression of right median nerve.  SURGEON:  Nicki Reaper, M.D.  ASSISTANT:  Joaquin Courts, R.N.  ANESTHESIA:  Axillary block with supplementation.  ANESTHESIOLOGIST:  Maren Beach, M.D.  HISTORY:  The patient is a 55 year old female with a history of carpal tunnel syndrome -- EMG and nerve conductions positive -- which has not responded to conservative treatment.  PROCEDURE:  The patient was brought to the operating room where an axillary block was carried out without difficulty.  She was prepped and draped using Betadine scrubbing solution with the right arm free.  The limb was exsanguinated with an Esmarch bandage.  Tourniquet placed high on the arm was inflated to 250 mmHg.  A longitudinal incision was made in the palm and carried down through subcutaneous tissue.  Bleeders were electrocauterized. Palmar fascia was split, superficial palmar arch identified, flexor tendon to the ring and little finger identified.  To the ulnar side of the median nerve, carpal retinaculum was incised with sharp dissection.  A right-angle and Sewall retractor were placed between skin and forearm fascia.  The fascia was released for approximately 3 cm proximal to the wrist crease under direct vision.  Canal was explored.  No further lesions were identified. Tenosynovial tissue was moderately thickened.  The wound was irrigated.  Skin was closed with interrupted 5-0 nylon suture.  Sterile compressive dressing and split were applied.  The patient tolerated the procedure well and was taken to the recovery room for observation in satisfactory  condition.  She is discharged home to return to the [facility] in one week, on Vicodin and Keflex. DD:  02/18/01 TD:  02/18/01 Job: [id number]

## 2011-04-06 NOTE — H&P (Signed)
NAME:  Jamie Harding, Jamie Harding                         ACCOUNT NO.:  [id number]   MEDICAL RECORD NO.:  [id number]                   PATIENT TYPE:  INP   LOCATION:  NA                                   FACILITY:  [facility]   PHYSICIAN:  Olga Millers, M.D. [facility]            DATE OF BIRTH:  04-10-1956   DATE OF ADMISSION:  10/05/2002  DATE OF DISCHARGE:                                HISTORY & PHYSICAL   HISTORY OF PRESENT ILLNESS:  The patient is a 55 year old female with past  medical history of hypertension, diabetes mellitus, asthma, and  gastroesophageal reflux disease who presents with chest pain.  The patient  has no prior cardiac history.  This past Friday she developed substernal  chest pain that was described initially as a sharp sensation, then  subsequently has been described as a pressure.  It does not radiate.  There  is occasional nausea with the pain but there is no shortness of breath or  diaphoresis.  The pain is not pleuritic, not related to food.  It does  increase with certain movements including bending over and also certain arm  movements.  Because her symptoms persisted she went to the emergency room at  [facility] and was transferred for further evaluation.   ALLERGIES:  MORPHINE and SULFA.   MEDICATIONS:  1. Zantac 150 mg p.o. q.d.  2. Glucophage 500 mg p.o. q.d.  3. Allegra.  4. Hydrocodone.  5. Acetaminophen p.r.n.  6. Xanax 0.5 mg p.r.n.  7. Provera 10 days of the month.  8. Albuterol.  9. Advair.   SOCIAL HISTORY:  She does not smoke nor does she consume alcohol.   PAST MEDICAL HISTORY:  Significant for diabetes mellitus as well as  hypertension but there is no hyperlipidemia.  She does have a history of  asthma and gastroesophageal reflux disease.  She has had a prior tubal  ligation as well as C sections.  She has had a prior cholecystectomy.   FAMILY HISTORY:  Positive for coronary artery disease.   REVIEW OF SYSTEMS:  She denies any headaches or fevers  or chills.  There is  no productive cough or hemoptysis.  There is no dysphagia, odynophagia,  melena, or hematochezia.  There is dysuria or hematuria.  There is no rash  or seizure activity.  There is no orthopnea, PND, pedal edema.  The  remaining systems are negative.   PHYSICAL EXAMINATION:  VITAL SIGNS:  Blood pressure 138/88, pulse 77.  She  is afebrile at 99.4.  GENERAL:  She is well-developed and somewhat obese.  She is in no acute  distress.  SKIN:  Warm and dry.  HEENT:  Unremarkable with normal eyelids.  NECK:  Supple with a normal upstroke bilaterally and there are no bruits  noted.  There is no jugular venous distention and no thyromegaly noted.  CHEST:  Clear to auscultation with normal expansion.  CARDIOVASCULAR:  Reveals a respiratory rate with normal S1 and S2.  There  are no murmurs, rubs, or gallops noted.  She is tender in the chest area.  ABDOMEN:  Not tender or distended with positive bowel sounds.  No  hepatosplenomegaly and no masses appreciated.  There is no abdominal bruit.  She has 2+ femoral pulses bilaterally and no bruits.  EXTREMITIES:  Show no edema and I can palpate no cords.  She has 2+ dorsalis  pedis pulses bilaterally.  NEUROLOGIC:  Grossly intact.   LABORATORY DATA:  Her electrocardiogram shows a normal sinus rhythm and  there are no acute ST changes.  Her chest x-ray shows no acute disease.   Her white blood cell count is 9.2 with a hemoglobin of 13.4 and hematocrit  39.5, platelet count is 313.  Liver functions are normal.  Sodium 134 with  potassium 4.3.  BUN and creatinine are 11 and 0.8 respectively.  Enzymes:  CK 69, MB 1.3, troponin I normal at 0.03.   DIAGNOSES:  1. Atypical chest pain.  2. Diabetes mellitus.  3. Hypertension.  4. Strong family history of coronary disease.  5. Asthma.  6. History of gastroesophageal reflux disease.   PLAN:  The patient presents with chest pain that is atypical for coronary  disease.  It is most  consistent with musculoskeletal etiology.  Her  electrocardiogram is normal but she does have multiple risk factors.  We  will treat with aspirin and cycle enzymes.  If they are negative then we  will plan to proceed with Cardiolite in the morning for risk stratification.  If they are abnormal then she will require cardiac catheterization.  I will  add Motrin for probable musculoskeletal etiology.  We will also check a D-  dimer although I think pulmonary embolus is unlikely.  She will need to  continue with risk factor modification including weight loss, exercise, and  diet if her nuclear study is normal.                                               Olga Millers, M.D. [facility]    BC/MEDQ  D:  10/05/2002  T:  10/05/2002  Job:  [id number]

## 2011-04-06 NOTE — Discharge Summary (Signed)
NAME:  Jamie Harding, KURODA NO.:  [id number]   MEDICAL RECORD NO.:  [id number]                   PATIENT TYPE:  CINP   LOCATION:                                       FACILITY:  [facility]   PHYSICIAN:  Delton See, P.A. [facility]            DATE OF BIRTH:  1956-09-10   DATE OF ADMISSION:  10/05/2002  DATE OF DISCHARGE:  10/06/2002                           DISCHARGE SUMMARY - REFERRING   HISTORY OF PRESENT ILLNESS:  This is a 55 year old female with a history of  hypertension, diabetes mellitus, asthma and gastroesophageal reflux disease,  who presented for evaluation of chest pain.  The patient has no previous  cardiac history.  She was seen by Dr. Olga Millers after she was  transferred from [facility] Emergency Room for further evaluation.   PAST MEDICAL/SURGICAL HISTORY:  Please see the history as noted above.  As  noted, she does have diabetes mellitus, hypertension, obesity, asthma,  gastroesophageal reflux disease, and she is status post prior tubal ligation  as well as a C-section.  She is status post a cholecystectomy.   ALLERGIES:  MORPHINE AND SULFA.   SOCIAL HISTORY:  The patient does not smoke.  She does not use alcohol.   FAMILY HISTORY:  Positive for coronary artery disease.   HOSPITAL COURSE:  As noted, this patient was admitted to [facility] by Dr. Olga Millers for further evaluation of chest  pain.  Her cardiac enzymes were found to be negative.  A D-dimer was  negative as well.  The patient was scheduled for an adenosine Cardiolite  that was performed on October 06, 2002.  The Cardiolite was read as  negative for ischemia, with an ejection fraction of 68%.  Arrangements were  made to discharge the patient home later that evening in an improved and  stable condition.   LABORATORY DATA:  As noted, cardiac enzymes were negative.  D-dimer was  negative.  A CBC on October 05, 2002, revealed a hemoglobin of  13.4,  hematocrit 39.5, WBC 9200, platelets 313,000.  Chemistries on October 05, 2002, revealed BUN 11, creatinine 0.8, potassium 4.3, sodium 134, glucose  175.  A chest x-ray showed no active disease.  An electrocardiogram showed a normal sinus rhythm, rate 58 beats per minute,  and was essentially a normal electrocardiogram.   DISCHARGE MEDICATIONS:  1. The patient was told to continue the same medications he was on prior to     admission, including Zantac 150 mg q.d.  2. Glucophage 500 mg q.d.  3. Allegra as perviously taken.  4. Hydrocodone as previously taken.  5. Xanax 0.5 mg p.r.n.  6. Provera as previously taken.  7. Albuterol as previously taken.  8. Advair as previously taken.  9. It was also recommended that she take a coated aspirin 81 mg q.d.   DIET:  The patient was told to continue  on her low-salt, low-fat, diabetic  diet.   ACTIVITY:  She was to continue her activity as tolerated.   FOLLOW UP:  She was to call Dr. [name] for an appointment.  She is to  follow up with cardiology as needed.   DISCHARGE DIAGNOSES:  1. Atypical chest pain.  2. Negative adenosine Cardiolite performed on October 06, 2002.  3. Diabetes mellitus.  4. Hypertension.  5. Strong family history of coronary artery disease.  6. History of asthma.  7. History of gastroesophageal reflux disease.                                                 Delton See, P.A. [facility]    DR/MEDQ  D:  10/06/2002  T:  10/06/2002  Job:  [id number]   cc:   Lorin Picket A. Gerda Diss, M.D.  [address]  [place]  [place] [address]  Fax: [phone]

## 2011-04-17 ENCOUNTER — Other Ambulatory Visit: Payer: Self-pay | Admitting: Internal Medicine

## 2011-04-25 NOTE — Telephone Encounter (Signed)
Taken care of

## 2011-05-02 ENCOUNTER — Ambulatory Visit: Payer: 59 | Admitting: Orthopedic Surgery

## 2011-05-02 ENCOUNTER — Encounter: Payer: Self-pay | Admitting: Orthopedic Surgery

## 2011-06-27 ENCOUNTER — Ambulatory Visit: Payer: 59 | Admitting: Internal Medicine

## 2011-08-07 ENCOUNTER — Other Ambulatory Visit: Payer: Self-pay | Admitting: Family Medicine

## 2011-08-07 DIAGNOSIS — Z139 Encounter for screening, unspecified: Secondary | ICD-10-CM

## 2011-09-03 ENCOUNTER — Ambulatory Visit (HOSPITAL_COMMUNITY)
Admission: RE | Admit: 2011-09-03 | Discharge: 2011-09-03 | Disposition: A | Discharge status: Home / Self Care | Payer: 59 | Source: Ambulatory Visit | Attending: Family Medicine | Admitting: Family Medicine

## 2011-09-03 DIAGNOSIS — Z139 Encounter for screening, unspecified: Secondary | ICD-10-CM

## 2011-09-03 DIAGNOSIS — Z1231 Encounter for screening mammogram for malignant neoplasm of breast: Secondary | ICD-10-CM | POA: Insufficient documentation

## 2011-09-04 ENCOUNTER — Other Ambulatory Visit: Payer: Self-pay | Admitting: Internal Medicine

## 2011-09-07 ENCOUNTER — Other Ambulatory Visit: Payer: Self-pay | Admitting: Internal Medicine

## 2011-09-17 ENCOUNTER — Other Ambulatory Visit: Payer: Self-pay | Admitting: Internal Medicine

## 2011-10-26 ENCOUNTER — Ambulatory Visit: Payer: 59 | Admitting: Internal Medicine

## 2011-12-20 ENCOUNTER — Encounter: Payer: Self-pay | Admitting: Orthopedic Surgery

## 2011-12-20 ENCOUNTER — Ambulatory Visit (INDEPENDENT_AMBULATORY_CARE_PROVIDER_SITE_OTHER): Payer: 59 | Admitting: Orthopedic Surgery

## 2011-12-20 VITALS — Ht 62.0 in | Wt 240.0 lb

## 2011-12-20 DIAGNOSIS — M722 Plantar fascial fibromatosis: Secondary | ICD-10-CM

## 2011-12-20 DIAGNOSIS — M6530 Trigger finger, unspecified finger: Secondary | ICD-10-CM

## 2011-12-20 NOTE — Progress Notes (Signed)
Patient ID: Jamie Harding, female   DOB: 14-Sep-1956, 56 y.o.   MRN: [id number]  Chief Complaint  Patient presents with  . Hand Pain    Bilateral hand, trigger fingers.    2nd complaint is RIGHT foot, plantar pain with spur.  Previous history of trigger finger comes in today with bilateral long finger triggering. Pain over the A1 pulley, which is sharp, stabbing 9/10, constant, also has some numbness and tingling with a history of previous RIGHT carpal tunnel release. The locking catching centimeter most prominent symptoms.  There is also pain in the heel. Document x-ray shows a heel spur, although we don't have that today.  Pain is noted in the early morning and at the end of the day partially relieved by changing shoe wear.  Review of systems as follows. The teeth, snoring, heartburn, nausea, vomiting, urgency, skin, poor healing, numbness and tingling as stated. Anxiety, depression, and seasonal allergies, remaining systems were reviewed and were negative  Physical Exam(12) GENERAL: normal development, endomorphic    CDV: pulses are normal in right foot and both wrists, color and temperature normal   Skin: normal both hands and right foot   Lymph: nodes were not palpable/normal  Psychiatric: awake, alert and oriented  Neuro: normal sensation  In right foot  MSK gait is normal  1 There is tenderness over the A1 pulley of the long finger of both upper extremities. There is clicking and catching. Flexion power is normal. No instability. Range of motion otherwise, normal both upper extremities. 2 RIGHT foot is tender over the plantar and lateral aspect. No Achilles pain. Appears to be involved pump bump there. No swelling around the Achilles. Strength normal in the Achilles. Range of motion normal and ankle. Ankle is stable.   Assessment: bilateral long finger triggering Assessment: Plantar fasciitis, RIGHT foot    Plan: injected both trigger fingers. Plan: Plantar fascia  exercise program and patient education.  Procedure note trigger finger injection RLF AND LLF   Diagnosis trigger finger Postop diagnosis trigger finger Procedure injection of trigger finger Finger injected RIGHT long finger and LEFT long finger  Details of procedure: After verbal consent and timeout to confirm site the RIGHT long finger was injected with 1 cc of 40 mg of Depo-Medrol and 1 cc of 1% lidocaine this was then repeated on the LEFT finger  The procedure was tolerated well without complication

## 2011-12-20 NOTE — Patient Instructions (Signed)
You have received a steroid shot. 15% of patients experience increased pain at the injection site with in the next 24 hours. This is best treated with ice and tylenol extra strength 2 tabs every 8 hours. If you are still having pain please call the office.   Plantar Fasciitis (Heel Spur Syndrome) with Rehab The plantar fascia is a fibrous, ligament-like, soft-tissue structure that spans the bottom of the foot. Plantar fasciitis is a condition that causes pain in the foot due to inflammation of the tissue. SYMPTOMS    Pain and tenderness on the underneath side of the foot.     Pain that worsens with standing or walking.  CAUSES   Plantar fasciitis is caused by irritation and injury to the plantar fascia on the underneath side of the foot. Common mechanisms of injury include:  Direct trauma to bottom of the foot.     Damage to a small nerve that runs under the foot where the main fascia attaches to the heel bone.     Stress placed on the plantar fascia due to bone spurs.  RISK INCREASES WITH:    Activities that place stress on the plantar fascia (running, jumping, pivoting, or cutting).     Poor strength and flexibility.     Improperly fitted shoes.     Tight calf muscles.     Flat feet.     Failure to warm-up properly before activity.     Obesity.  PREVENTION  Warm up and stretch properly before activity.     Allow for adequate recovery between workouts.     Maintain physical fitness:     Strength, flexibility, and endurance.     Cardiovascular fitness.     Maintain a health body weight.     Avoid stress on the plantar fascia.     Wear properly fitted shoes, including arch supports for individuals who have flat feet.  PROGNOSIS   If treated properly, then the symptoms of plantar fasciitis usually resolve without surgery. However, occasionally surgery is necessary. RELATED COMPLICATIONS    Recurrent symptoms that may result in a chronic condition.     Problems of  the lower back that are caused by compensating for the injury, such as limping.     Pain or weakness of the foot during push-off following surgery.     Chronic inflammation, scarring, and partial or complete fascia tear, occurring more often from repeated injections.  TREATMENT   Treatment initially involves the use of ice and medication to help reduce pain and inflammation. The use of strengthening and stretching exercises may help reduce pain with activity, especially stretches of the Achilles tendon. These exercises may be performed at home or with a therapist. Your caregiver may recommend that you use heel cups of arch supports to help reduce stress on the plantar fascia. Occasionally, corticosteroid injections are given to reduce inflammation. If symptoms persist for greater than 6 months despite non-surgical (conservative), then surgery may be recommended.   MEDICATION    If pain medication is necessary, then nonsteroidal anti-inflammatory medications, such as aspirin and ibuprofen, or other minor pain relievers, such as acetaminophen, are often recommended.     Do not take pain medication within 7 days before surgery.     Prescription pain relievers may be given if deemed necessary by your caregiver. Use only as directed and only as much as you need.     Corticosteroid injections may be given by your caregiver. These injections should be reserved for  the most serious cases, because they may only be given a certain number of times.  HEAT AND COLD  Cold treatment (icing) relieves pain and reduces inflammation. Cold treatment should be applied for 10 to 15 minutes every 2 to 3 hours for inflammation and pain and immediately after any activity that aggravates your symptoms. Use ice packs or massage the area with a piece of ice (ice massage).     Heat treatment may be used prior to performing the stretching and strengthening activities prescribed by your caregiver, physical therapist, or  athletic trainer. Use a heat pack or soak the injury in warm water.   EXERCISES RANGE OF MOTION (ROM) AND STRETCHING EXERCISES - Plantar Fasciitis (Heel Spur Syndrome) These exercises may help you when beginning to rehabilitate your injury. Your symptoms may resolve with or without further involvement from your physician, physical therapist or athletic trainer. While completing these exercises, remember:    Restoring tissue flexibility helps normal motion to return to the joints. This allows healthier, less painful movement and activity.     An effective stretch should be held for at least 30 seconds.     A stretch should never be painful. You should only feel a gentle lengthening or release in the stretched tissue.  RANGE OF MOTION - Toe Extension, Flexion  Sit with your right / left leg crossed over your opposite knee.     Grasp your toes and gently pull them back toward the top of your foot. You should feel a stretch on the bottom of your toes and/or foot.     Hold this stretch for _30_________ seconds.     Now, gently pull your toes toward the bottom of your foot. You should feel a stretch on the top of your toes and or foot.     Hold this stretch for ____30______ seconds.  Repeat ____5______ times. Complete this stretch _____2_____ times per day.    STRETCH - Gastroc, Standing  Place hands on wall.     Extend right / left leg, keeping the front knee somewhat bent.     Slightly point your toes inward on your back foot.     Keeping your right / left heel on the floor and your knee straight, shift your weight toward the wall, not allowing your back to arch.     You should feel a gentle stretch in the right / left calf. Hold this position for __30________ seconds.  Repeat _____15_____ times. Complete this stretch ___2_______ times per day. STRETCH - Soleus, Standing  Place hands on wall.     Extend right / left leg, keeping the other knee somewhat bent.     Slightly point  your toes inward on your back foot.     Keep your right / left heel on the floor, bend your back knee, and slightly shift your weight over the back leg so that you feel a gentle stretch deep in your back calf.     Hold this position for ___30_______ seconds.  Repeat ____15______ times. Complete this stretch _____2_____ times per day.  STRENGTHENING EXERCISES - Plantar Fasciitis (Heel Spur Syndrome)  These exercises may help you when beginning to rehabilitate your injury. They may resolve your symptoms with or without further involvement from your physician, physical therapist or athletic trainer. While completing these exercises, remember:    Muscles can gain both the endurance and the strength needed for everyday activities through controlled exercises.     Complete these exercises as instructed  by your physician, physical therapist or athletic trainer. Progress the resistance and repetitions only as guided.  STRENGTH - Towel Curls  Sit in a chair positioned on a non-carpeted surface.     Place your foot on a towel, keeping your heel on the floor.     Pull the towel toward your heel by only curling your toes. Keep your heel on the floor.      Repeat ___15_______ times. Complete this exercise ___2_______ times per day.

## 2012-01-31 ENCOUNTER — Ambulatory Visit: Payer: 59 | Admitting: Orthopedic Surgery

## 2012-04-16 ENCOUNTER — Other Ambulatory Visit: Payer: Self-pay | Admitting: Family Medicine

## 2012-04-16 ENCOUNTER — Ambulatory Visit (HOSPITAL_COMMUNITY)
Admission: RE | Admit: 2012-04-16 | Discharge: 2012-04-16 | Disposition: A | Discharge status: Home / Self Care | Payer: 59 | Source: Ambulatory Visit | Attending: Family Medicine | Admitting: Family Medicine

## 2012-04-16 DIAGNOSIS — M545 Low back pain, unspecified: Secondary | ICD-10-CM | POA: Insufficient documentation

## 2012-04-16 DIAGNOSIS — W19XXXA Unspecified fall, initial encounter: Secondary | ICD-10-CM | POA: Insufficient documentation

## 2012-04-16 DIAGNOSIS — M549 Dorsalgia, unspecified: Secondary | ICD-10-CM

## 2012-04-16 DIAGNOSIS — M51379 Other intervertebral disc degeneration, lumbosacral region without mention of lumbar back pain or lower extremity pain: Secondary | ICD-10-CM | POA: Insufficient documentation

## 2012-04-16 DIAGNOSIS — M5137 Other intervertebral disc degeneration, lumbosacral region: Secondary | ICD-10-CM | POA: Insufficient documentation

## 2012-04-16 DIAGNOSIS — IMO0002 Reserved for concepts with insufficient information to code with codable children: Secondary | ICD-10-CM | POA: Insufficient documentation

## 2012-08-08 ENCOUNTER — Telehealth: Payer: Self-pay | Admitting: Urgent Care

## 2012-08-08 ENCOUNTER — Ambulatory Visit: Payer: 59 | Admitting: Urgent Care

## 2012-08-08 NOTE — Telephone Encounter (Signed)
noted 

## 2012-08-08 NOTE — Telephone Encounter (Signed)
Pt was a no show

## 2012-08-14 ENCOUNTER — Other Ambulatory Visit: Payer: Self-pay | Admitting: Family Medicine

## 2012-08-14 DIAGNOSIS — Z139 Encounter for screening, unspecified: Secondary | ICD-10-CM

## 2012-08-27 ENCOUNTER — Encounter: Payer: Self-pay | Admitting: Internal Medicine

## 2012-08-28 ENCOUNTER — Ambulatory Visit: Payer: 59 | Admitting: Gastroenterology

## 2012-08-28 ENCOUNTER — Encounter: Payer: Self-pay | Admitting: Gastroenterology

## 2012-08-28 ENCOUNTER — Ambulatory Visit (INDEPENDENT_AMBULATORY_CARE_PROVIDER_SITE_OTHER): Payer: 59 | Admitting: Gastroenterology

## 2012-08-28 VITALS — BP 127/81 | HR 63 | Temp 98.2°F | Ht 62.0 in | Wt 240.2 lb

## 2012-08-28 DIAGNOSIS — Z1211 Encounter for screening for malignant neoplasm of colon: Secondary | ICD-10-CM | POA: Insufficient documentation

## 2012-08-28 DIAGNOSIS — K3189 Other diseases of stomach and duodenum: Secondary | ICD-10-CM

## 2012-08-28 DIAGNOSIS — R1013 Epigastric pain: Secondary | ICD-10-CM | POA: Insufficient documentation

## 2012-08-28 MED ORDER — PEG-KCL-NACL-NASULF-NA ASC-C 100 G PO SOLR: 1.0000 | ORAL | Status: DC

## 2012-08-28 NOTE — Patient Instructions (Addendum)
Start taking supplemental fiber daily. We have provided samples (Metamucil).  Continue Nexium.  We have set you up for an upper endoscopy and colonoscopy with Dr. Darrick Penna.   INSTRUCTIONS FOR DIABETIC MEDS: Take 1/2 dose of Lantus evening prior Take 1/2 dose of oral meds the evening prior, none the day of. Bring your diabetic medications with you to the procedure

## 2012-08-28 NOTE — Assessment & Plan Note (Signed)
No prior TCS. No rectal bleeding noted. Add fiber to diet. Further recommendations once completed.

## 2012-08-28 NOTE — Assessment & Plan Note (Signed)
56 year old female with chronic abdominal pain, notes epigastric pain, nausea, occasional vomiting. Last EGD in 2010 with Dr. Juanda Chance, mild gastritis. PMH notes gastroparesis, but GES in 2010 was normal. No melena, NSAIDs, aspirin powders. +early satiety. During these episodes, notes diarrhea. On Nexium BID currently. Question underlying constipation aggravating symptoms, ?non-ulcer dyspepsia, unable to r/o PUD, underlying gastroparesis. Will pursue upper endoscopy in near future WITH PROPOFOL due to polypharmacy.   Proceed with upper endoscopy in the near future with Dr. Darrick Penna. Will be performed with PROPOFOL due to polypharmacy. The risks, benefits, and alternatives have been discussed in detail with patient. They have stated understanding and desire to proceed.  Continue Nexium BID

## 2012-08-28 NOTE — Progress Notes (Signed)
Faxed to PCP

## 2012-08-28 NOTE — Progress Notes (Signed)
Referring Provider: Babs Sciara, MD Primary Care Physician:  [name], MD Primary Gastroenterologist:  Dr. Darrick Penna   Chief Complaint  Patient presents with  . Abdominal Pain  . Gastrophageal Reflux    HPI:   Jamie Harding is a 56 year old female who presents today at the request of Dr. Gerda Diss secondary to abdominal pain. States she has had stomach problems her whole life. Remembers as a child she used to have her mom rub her stomach at night. Notes chronic GERD symptoms. Cholecystectomy in the past due to dyskinesia. Did fairly well for awhile. Notes as she gets older, worsening symptoms. Notes epigastric pain, "hard knot", +nausea, sometimes vomiting, sometimes diarrhea, sometimes both. Intermittent. Unable to link with po intake. Works 12 hours a day, leaves at 6 am, gets back at 8:30. Doesn't really eat a full meal at night because she goes straight to bed.  Always cook out on grill on Saturday nights. Notes that usually she gets sick after this, but sometimes she doesn't. No melena. No hematochezia. No prior colonoscopy. Last EGD by Dr. Juanda Chance in 2010. Symptoms feel worse than 2010. Negative GES in 2010.   She is now taking Nexium BID. No significant reflux. No NSAIDs or aspirin powders routinely. Takes hydrocodone 2-3 days a day for arthritis pain. Appetite waxes and wanes. Wt between 230-240. Feels full off of small amounts of food.   Will have loose stools after these episodes sometimes. Normal for her is BM every 2-3 days. Sometimes issues with constipation, sometimes not. Doesn't feel like she has to take anything. Doesn't feel bloated or uncomfortable.   Outside labs Sept 2013:  Negative H.pylori serologies Normal CBC Normal LFTs Lipase normal, 35  Past Medical History  Diagnosis Date  . DM (diabetes mellitus)   . Stroke 2008  . HTN (hypertension)   . Obese   . Depression   . Anxiety   . HLD (hyperlipidemia)   . Hiatal hernia   . Asthma   . GERD (gastroesophageal  reflux disease)   . Fatty liver   . Gastritis   . Pancreatitis   . Tibialis tendinitis   . FH: CAD (coronary artery disease)   . Chest pain   . Gastroparesis     NEGATIVE GES 2010  . Rheumatoid arthritis     Past Surgical History  Procedure Date  . Cesarean section   . Cholecystectomy   . Ankle surgery     remove extra bone  . Finger surgery     left little finger  . Lithotripsy   . Dilation and curettage of uterus     multiple  . Laser ablation of the cervix   . Carpal tunnel release     right side  . Tubal ligation   . Gastric emptying 07/27/2009    The amount of activity in the stomach at 120 minutes was 13% which is in the normal range  . Esophagogastroduodenoscopy 07/29/2009    Mild gastritis, benign path    Current Outpatient Prescriptions  Medication Sig Dispense Refill  . ALPRAZolam (XANAX) 0.5 MG tablet Take 0.5 mg by mouth 2 (two) times daily as needed.       . Alum & Mag Hydroxide-Simeth (GI COCKTAIL) SUSP suspension Take 30 mLs by mouth 2 (two) times daily. Shake well.      Marland Kitchen AMLODIPINE BESYLATE PO Take 5 mg by mouth daily.       Marland Kitchen aspirin 325 MG tablet Take 325 mg by mouth daily.        Marland Kitchen  Exenatide (BYETTA 10 MCG PEN [place]) Inject 10 mcg into the skin 2 (two) times daily.      . fluticasone (FLONASE) 50 MCG/ACT nasal spray Place 2 sprays into the nose daily.      . furosemide (LASIX) 20 MG tablet Take 20 mg by mouth daily.        . Gabapentin (NEURONTIN PO) Take 300 mg by mouth 2 (two) times daily.       Marland Kitchen HYDROcodone-acetaminophen (NORCO/VICODIN) 5-325 MG per tablet Take 1 tablet by mouth every 6 (six) hours as needed.      . insulin aspart (NOVOLOG) 100 UNIT/ML injection Inject as directed 3 (three) times daily before meals.        . insulin glargine (LANTUS) 100 UNIT/ML injection Inject 80 Units into the skin at bedtime.       Marland Kitchen lisinopril (PRINIVIL,ZESTRIL) 20 MG tablet Take 20 mg by mouth daily.        . metFORMIN (GLUCOPHAGE) 1000 MG tablet Take 1,000 mg  by mouth 2 (two) times daily with a meal.       . methocarbamol (ROBAXIN) 500 MG tablet Take 500 mg by mouth daily.       . Multiple Vitamin (MULTIVITAMIN) tablet Take 1 tablet by mouth daily.        Marland Kitchen NEXIUM 40 MG capsule TAKE 1 CAPSULE BY MOUTH ONCE DAILY 30 MINUTES BEFORE A MEAL  90 capsule  0  . ondansetron (ZOFRAN) 8 MG tablet Take by mouth daily.        . pravastatin (PRAVACHOL) 80 MG tablet Take 80 mg by mouth daily.       . promethazine (PHENERGAN) 25 MG tablet Take 25 mg by mouth 2 (two) times daily as needed.       . sucralfate (CARAFATE) 1 GM/10ML suspension Take 1 g by mouth 4 (four) times daily.      Marland Kitchen venlafaxine (EFFEXOR) 75 MG tablet Take 75 mg by mouth daily.       Marland Kitchen glyBURIDE micronized (GLYNASE) 6 MG tablet Take 6 mg by mouth 2 (two) times daily before a meal.        Current Facility-Administered Medications  Medication Dose Route Frequency Provider Last Rate Last Dose  . methylPREDNISolone acetate (DEPO-MEDROL) injection 40 mg  40 mg Intra-articular Once Vickki Hearing, MD        Allergies as of 08/28/2012 - Review Complete 08/28/2012  Allergen Reaction Noted  . Azithromycin  07/15/2009  . Morphine    . Naproxen  07/15/2009  . Sulfonamide derivatives      Family History  Problem Relation Age of Onset  . Breast cancer Mother   . Diabetes Father   . Coronary artery disease    . Arthritis    . Asthma    . Colon cancer Neg Hx   . Diabetes Sister     History   Social History  . Marital Status: Married    Spouse Name: N/A    Number of Children: N/A  . Years of Education: N/A   Occupational History  . 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     Social History Main Topics  . Smoking status: Former Games developer  . Smokeless tobacco: Not on file  . Alcohol Use: No  . Drug Use: No  . Sexually Active: Not on file   Other Topics Concern  . Not on file   Social History Narrative  . No narrative  on file    Review of Systems: Gen: SEE  HPI CV: Denies chest pain, heart palpitations, syncope, peripheral edema. Resp: Denies shortness of breath with rest, cough, wheezing +DOE GI: Denies dysphagia or odynophagia. Denies hematemesis, fecal incontinence, or jaundice.  GU : Denies urinary burning, urinary frequency, urinary incontinence.  MS: + joint pain Derm: Denies rash, itching, dry skin Psych: + depression Heme: Denies bruising, bleeding, and enlarged lymph nodes.  Physical Exam: BP 127/81  Pulse 63  Temp 98.2 F (36.8 C) (Temporal)  Ht 5\' 2"  (1.575 m)  Wt 240 lb 3.2 oz (108.954 kg)  BMI 43.93 kg/m2 General:   Alert and oriented. Well-developed, well-nourished, pleasant and cooperative. Head:  Normocephalic and atraumatic. Eyes:  Conjunctiva pink, sclera clear, no icterus.   Conjunctiva pink. Ears:  Normal auditory acuity. Nose:  No deformity, discharge,  or lesions. Mouth:  No deformity or lesions, mucosa pink and moist.  Neck:  Supple, without mass or thyromegaly. Lungs:  Clear to auscultation bilaterally, without wheezing, rales, or rhonchi.  Heart:  S1, S2 present without murmurs noted.  Abdomen:  +BS, soft, mild TTP epigastric region and non-distended. Without mass or HSM. No rebound or guarding. No hernias noted. Rectal:  Deferred  Msk:  Symmetrical without gross deformities. Normal posture. Pulses:  Normal pulses noted. Extremities:  Without clubbing or edema. Neurologic:  Alert and  oriented x4;  grossly normal neurologically. Skin:  Intact, warm and dry without significant lesions or rashes Cervical Nodes:  No significant cervical adenopathy. Psych:  Alert and cooperative. Normal mood and affect.

## 2012-09-01 ENCOUNTER — Ambulatory Visit (HOSPITAL_COMMUNITY): Payer: 59

## 2012-09-04 ENCOUNTER — Ambulatory Visit (HOSPITAL_COMMUNITY)
Admission: RE | Admit: 2012-09-04 | Discharge: 2012-09-04 | Disposition: A | Discharge status: Home / Self Care | Payer: 59 | Source: Ambulatory Visit | Attending: Family Medicine | Admitting: Family Medicine

## 2012-09-04 ENCOUNTER — Ambulatory Visit (INDEPENDENT_AMBULATORY_CARE_PROVIDER_SITE_OTHER): Payer: 59 | Admitting: Orthopedic Surgery

## 2012-09-04 ENCOUNTER — Encounter: Payer: Self-pay | Admitting: Orthopedic Surgery

## 2012-09-04 VITALS — BP 100/60 | Ht 62.0 in | Wt 235.0 lb

## 2012-09-04 DIAGNOSIS — M6530 Trigger finger, unspecified finger: Secondary | ICD-10-CM

## 2012-09-04 DIAGNOSIS — Z1231 Encounter for screening mammogram for malignant neoplasm of breast: Secondary | ICD-10-CM | POA: Insufficient documentation

## 2012-09-04 DIAGNOSIS — Z139 Encounter for screening, unspecified: Secondary | ICD-10-CM

## 2012-09-04 NOTE — Progress Notes (Signed)
Patient ID: Jamie Harding, female   DOB: 05/24/1956, 57 y.o.   MRN: [id number] Chief Complaint  Patient presents with  . Follow-up    Bilateral hand trigger finger injections.    Procedure note trigger finger injection  Diagnosis trigger finger Postop diagnosis trigger finger Procedure injection of trigger finger Finger injected left long finger and right  Details of procedure: After verbal consent and timeout to confirm site the leftlong finger was injected with 1 cc of 40 mg of Depo-Medrol and 1 cc of 1% lidocaine/repeated x 1 The procedure was tolerated well without complication

## 2012-09-04 NOTE — Patient Instructions (Signed)
You have received a steroid shot. 15% of patients experience increased pain at the injection site with in the next 24 hours. This is best treated with ice and tylenol extra strength 2 tabs every 8 hours. If you are still having pain please call the office.    

## 2012-09-05 ENCOUNTER — Encounter (HOSPITAL_COMMUNITY): Payer: Self-pay

## 2012-09-10 ENCOUNTER — Encounter (HOSPITAL_COMMUNITY)
Admission: RE | Admit: 2012-09-10 | Discharge: 2012-09-10 | Disposition: A | Discharge status: Home / Self Care | Payer: 59 | Source: Ambulatory Visit | Attending: Gastroenterology | Admitting: Gastroenterology

## 2012-09-10 ENCOUNTER — Encounter (HOSPITAL_COMMUNITY): Payer: Self-pay

## 2012-09-10 ENCOUNTER — Telehealth: Payer: Self-pay | Admitting: Gastroenterology

## 2012-09-10 HISTORY — DX: Polyneuropathy, unspecified: G62.9

## 2012-09-10 LAB — BASIC METABOLIC PANEL
BUN: 12 mg/dL (ref 6–23)
Chloride: 104 mEq/L (ref 96–112)
GFR calc Af Amer: >90 mL/min (ref >90)
Potassium: 4 mEq/L (ref 3.5–5.1)
Sodium: 140 mEq/L (ref 135–145)

## 2012-09-10 LAB — HEMOGLOBIN AND HEMATOCRIT, BLOOD
HCT: 40.1 % (ref 36.0–46.0)
Hemoglobin: 13.6 g/dL (ref 12.0–15.0)

## 2012-09-10 NOTE — Patient Instructions (Addendum)
20 Jamie Harding  09/10/2012   Your procedure is scheduled on:  09/16/2012  Report to [facility] at  615  AM.  Call this number if you have problems the morning of surgery: [phone]   Remember:   Do not eat food:After Midnight.  May have clear liquids:until Midnight .   Take these medicines the morning of surgery with A SIP OF WATER:  Xanax,norvasc,zyrtec,lisinopril,phenergan,nexium,neurontin,zofran,effexor. Take flonase before you come. Take lantus 40 units night before procedure and no insulin or diabetic meds morning of surgery.   Do not wear jewelry, make-up or nail polish.  Do not wear lotions, powders, or perfumes. You may wear deodorant.  Do not shave 48 hours prior to surgery. Men may shave face and neck.  Do not bring valuables to the hospital.  Contacts, dentures or bridgework may not be worn into surgery.  Leave suitcase in the car. After surgery it may be brought to your room.  For patients admitted to the hospital, checkout time is 11:00 AM the day of discharge.   Patients discharged the day of surgery will not be allowed to drive home.  Name and phone number of your driver: family  Special Instructions: N/A   Please read over the following fact sheets that you were given: Pain Booklet, MRSA Information, Surgical Site Infection Prevention, Anesthesia Post-op Instructions and Care and Recovery After Surgery Colonoscopy A colonoscopy is an exam to evaluate your entire colon. In this exam, your colon is cleansed. A long fiberoptic tube is inserted through your rectum and into your colon. The fiberoptic scope (endoscope) is a long bundle of enclosed and very flexible fibers. These fibers transmit light to the area examined and send images from that area to your caregiver. Discomfort is usually minimal. You may be given a drug to help you sleep (sedative) during or prior to the procedure. This exam helps to detect lumps (tumors), polyps, inflammation, and areas of  bleeding. Your caregiver may also take a small piece of tissue (biopsy) that will be examined under a microscope. LET YOUR CAREGIVER KNOW ABOUT:   Allergies to food or medicine.  Medicines taken, including vitamins, herbs, eyedrops, over-the-counter medicines, and creams.  Use of steroids (by mouth or creams).  Previous problems with anesthetics or numbing medicines.  History of bleeding problems or blood clots.  Previous surgery.  Other health problems, including diabetes and kidney problems.  Possibility of pregnancy, if this applies. BEFORE THE PROCEDURE   A clear liquid diet may be required for 2 days before the exam.  Ask your caregiver about changing or stopping your regular medications.  Liquid injections (enemas) or laxatives may be required.  A large amount of electrolyte solution may be given to you to drink over a short period of time. This solution is used to clean out your colon.  You should be present 60 minutes prior to your procedure or as directed by your caregiver. AFTER THE PROCEDURE   If you received a sedative or pain relieving medication, you will need to arrange for someone to drive you home.  Occasionally, there is a little blood passed with the first bowel movement. Do not be concerned. FINDING OUT THE RESULTS OF YOUR TEST Not all test results are available during your visit. If your test results are not back during the visit, make an appointment with your caregiver to find out the results. Do not assume everything is normal if you have not heard from your caregiver  or the medical facility. It is important for you to follow up on all of your test results. HOME CARE INSTRUCTIONS   It is not unusual to pass moderate amounts of gas and experience mild abdominal cramping following the procedure. This is due to air being used to inflate your colon during the exam. Walking or a warm pack on your belly (abdomen) may help.  You may resume all normal meals and  activities after sedatives and medicines have worn off.  Only take over-the-counter or prescription medicines for pain, discomfort, or fever as directed by your caregiver. Do not use aspirin or blood thinners if a biopsy was taken. Consult your caregiver for medicine usage if biopsies were taken. SEEK IMMEDIATE MEDICAL CARE IF:   You have a fever.  You pass large blood clots or fill a toilet with blood following the procedure. This may also occur 10 to 14 days following the procedure. This is more likely if a biopsy was taken.  You develop abdominal pain that keeps getting worse and cannot be relieved with medicine. Document Released: 11/02/2000 Document Revised: 01/28/2012 Document Reviewed: 06/17/2008 [facility] Patient Information 2013 [place], [place]. Esophagogastroduodenoscopy This is an endoscopic procedure (a procedure that uses a device like a flexible telescope) that allows your caregiver to view the upper stomach and small bowel. This test allows your caregiver to look at the esophagus. The esophagus carries food from your mouth to your stomach. They can also look at your duodenum. This is the first part of the small intestine that attaches to the stomach. This test is used to detect problems in the bowel such as ulcers and inflammation. PREPARATION FOR TEST Nothing to eat after midnight the day before the test. NORMAL FINDINGS Normal esophagus, stomach, and duodenum. Ranges for normal findings may vary among different laboratories and hospitals. You should always check with your doctor after having lab work or other tests done to discuss the meaning of your test results and whether your values are considered within normal limits. MEANING OF TEST  Your caregiver will go over the test results with you and discuss the importance and meaning of your results, as well as treatment options and the need for additional tests if necessary. OBTAINING THE TEST RESULTS It is your responsibility to  obtain your test results. Ask the lab or department performing the test when and how you will get your results. Document Released: 03/08/2005 Document Revised: 01/28/2012 Document Reviewed: 10/15/2008 [facility] Patient Information 2013 [place], [place]. PATIENT INSTRUCTIONS POST-ANESTHESIA  IMMEDIATELY FOLLOWING SURGERY:  Do not drive or operate machinery for the first twenty four hours after surgery.  Do not make any important decisions for twenty four hours after surgery or while taking narcotic pain medications or sedatives.  If you develop intractable nausea and vomiting or a severe headache please notify your doctor immediately.  FOLLOW-UP:  Please make an appointment with your surgeon as instructed. You do not need to follow up with anesthesia unless specifically instructed to do so.  WOUND CARE INSTRUCTIONS (if applicable):  Keep a dry clean dressing on the anesthesia/puncture wound site if there is drainage.  Once the wound has quit draining you may leave it open to air.  Generally you should leave the bandage intact for twenty four hours unless there is drainage.  If the epidural site drains for more than 36-48 hours please call the anesthesia department.  QUESTIONS?:  Please feel free to call your physician or the hospital operator if you have any questions, and  they will be happy to assist you.

## 2012-09-10 NOTE — Telephone Encounter (Signed)
PLEASE CALL PT.  HER BLOOD SUGAR IS ELEVATED. SHE NEEDS TO FOLLOW A DIABETIC DIET AND TAKE HER DIABETES MEDS.

## 2012-09-16 ENCOUNTER — Encounter (HOSPITAL_COMMUNITY): Payer: Self-pay | Admitting: *Deleted

## 2012-09-16 ENCOUNTER — Encounter (HOSPITAL_COMMUNITY): Payer: Self-pay | Admitting: Anesthesiology

## 2012-09-16 ENCOUNTER — Encounter (HOSPITAL_COMMUNITY)
Admission: RE | Disposition: A | Discharge status: Home / Self Care | Payer: Self-pay | Source: Ambulatory Visit | Attending: Gastroenterology

## 2012-09-16 ENCOUNTER — Ambulatory Visit (HOSPITAL_COMMUNITY): Payer: 59 | Admitting: Anesthesiology

## 2012-09-16 ENCOUNTER — Ambulatory Visit (HOSPITAL_COMMUNITY)
Admission: RE | Admit: 2012-09-16 | Discharge: 2012-09-16 | Disposition: A | Discharge status: Home / Self Care | Payer: 59 | Source: Ambulatory Visit | Attending: Gastroenterology | Admitting: Gastroenterology

## 2012-09-16 DIAGNOSIS — K2990 Gastroduodenitis, unspecified, without bleeding: Secondary | ICD-10-CM

## 2012-09-16 DIAGNOSIS — Z01810 Encounter for preprocedural cardiovascular examination: Secondary | ICD-10-CM | POA: Insufficient documentation

## 2012-09-16 DIAGNOSIS — Z1211 Encounter for screening for malignant neoplasm of colon: Secondary | ICD-10-CM | POA: Insufficient documentation

## 2012-09-16 DIAGNOSIS — Z01812 Encounter for preprocedural laboratory examination: Secondary | ICD-10-CM | POA: Insufficient documentation

## 2012-09-16 DIAGNOSIS — D129 Benign neoplasm of anus and anal canal: Secondary | ICD-10-CM | POA: Insufficient documentation

## 2012-09-16 DIAGNOSIS — R1310 Dysphagia, unspecified: Secondary | ICD-10-CM | POA: Insufficient documentation

## 2012-09-16 DIAGNOSIS — D126 Benign neoplasm of colon, unspecified: Secondary | ICD-10-CM

## 2012-09-16 DIAGNOSIS — K2970 Gastritis, unspecified, without bleeding: Secondary | ICD-10-CM

## 2012-09-16 DIAGNOSIS — E119 Type 2 diabetes mellitus without complications: Secondary | ICD-10-CM | POA: Insufficient documentation

## 2012-09-16 DIAGNOSIS — I10 Essential (primary) hypertension: Secondary | ICD-10-CM | POA: Insufficient documentation

## 2012-09-16 DIAGNOSIS — K648 Other hemorrhoids: Secondary | ICD-10-CM | POA: Insufficient documentation

## 2012-09-16 DIAGNOSIS — K219 Gastro-esophageal reflux disease without esophagitis: Secondary | ICD-10-CM

## 2012-09-16 DIAGNOSIS — D128 Benign neoplasm of rectum: Secondary | ICD-10-CM | POA: Insufficient documentation

## 2012-09-16 DIAGNOSIS — R1110 Vomiting, unspecified: Secondary | ICD-10-CM | POA: Insufficient documentation

## 2012-09-16 DIAGNOSIS — K5730 Diverticulosis of large intestine without perforation or abscess without bleeding: Secondary | ICD-10-CM | POA: Insufficient documentation

## 2012-09-16 DIAGNOSIS — K2940 Chronic atrophic gastritis without bleeding: Secondary | ICD-10-CM | POA: Insufficient documentation

## 2012-09-16 HISTORY — PX: POLYPECTOMY: SHX5525

## 2012-09-16 HISTORY — PX: COLONOSCOPY WITH PROPOFOL: SHX5780

## 2012-09-16 HISTORY — PX: SAVORY DILATION: SHX5439

## 2012-09-16 HISTORY — PX: ESOPHAGOGASTRODUODENOSCOPY (EGD) WITH PROPOFOL: SHX5813

## 2012-09-16 LAB — GLUCOSE, CAPILLARY
Glucose-Capillary: 87 mg/dL (ref 70–99)
Glucose-Capillary: 87 mg/dL (ref 70–99)

## 2012-09-16 SURGERY — COLONOSCOPY WITH PROPOFOL
Anesthesia: Monitor Anesthesia Care | Site: Esophagus

## 2012-09-16 MED ORDER — DEXTROSE 50 % IV SOLN: 12.5000 mL | Freq: Once | INTRAVENOUS | Status: DC

## 2012-09-16 MED ORDER — STERILE WATER FOR IRRIGATION IR SOLN
Status: DC | PRN
  Administered 2012-09-16: 07:00:00

## 2012-09-16 MED ORDER — PROPOFOL INFUSION 10 MG/ML OPTIME
INTRAVENOUS | Status: DC | PRN
  Administered 2012-09-16: 75 ug/kg/min via INTRAVENOUS

## 2012-09-16 MED ORDER — GLYCOPYRROLATE 0.2 MG/ML IJ SOLN
INTRAMUSCULAR | Status: AC
  Filled 2012-09-16: qty 1

## 2012-09-16 MED ORDER — MIDAZOLAM HCL 2 MG/2ML IJ SOLN
1.0000 mg | INTRAMUSCULAR | Status: DC | PRN
  Administered 2012-09-16: 2 mg via INTRAVENOUS

## 2012-09-16 MED ORDER — LACTATED RINGERS IV SOLN
INTRAVENOUS | Status: DC
  Administered 2012-09-16: 1000 mL via INTRAVENOUS

## 2012-09-16 MED ORDER — MIDAZOLAM HCL 5 MG/5ML IJ SOLN
INTRAMUSCULAR | Status: DC | PRN
  Administered 2012-09-16: 2 mg via INTRAVENOUS

## 2012-09-16 MED ORDER — FENTANYL CITRATE 0.05 MG/ML IJ SOLN
INTRAMUSCULAR | Status: DC | PRN
  Administered 2012-09-16 (×4): 25 ug via INTRAVENOUS

## 2012-09-16 MED ORDER — DEXTROSE 50 % IV SOLN
12.5000 g | Freq: Once | INTRAVENOUS | Status: AC
  Administered 2012-09-16: 12.5 g via INTRAVENOUS

## 2012-09-16 MED ORDER — ONDANSETRON HCL 4 MG/2ML IJ SOLN
4.0000 mg | Freq: Once | INTRAMUSCULAR | Status: AC
  Administered 2012-09-16: 4 mg via INTRAVENOUS

## 2012-09-16 MED ORDER — DEXTROSE 50 % IV SOLN
INTRAVENOUS | Status: AC
  Filled 2012-09-16: qty 50

## 2012-09-16 MED ORDER — FENTANYL CITRATE 0.05 MG/ML IJ SOLN
INTRAMUSCULAR | Status: AC
  Filled 2012-09-16: qty 2

## 2012-09-16 MED ORDER — PROPOFOL 10 MG/ML IV EMUL
INTRAVENOUS | Status: AC
  Filled 2012-09-16: qty 20

## 2012-09-16 MED ORDER — DEXTROSE 50 % IV SOLN
25.0000 mL | Freq: Once | INTRAVENOUS | Status: AC
  Administered 2012-09-16: 25 mL via INTRAVENOUS

## 2012-09-16 MED ORDER — MIDAZOLAM HCL 2 MG/2ML IJ SOLN
INTRAMUSCULAR | Status: AC
  Filled 2012-09-16: qty 2

## 2012-09-16 MED ORDER — MINERAL OIL PO OIL
TOPICAL_OIL | ORAL | Status: AC
  Filled 2012-09-16: qty 30

## 2012-09-16 MED ORDER — LIDOCAINE HCL (PF) 1 % IJ SOLN
INTRAMUSCULAR | Status: AC
  Filled 2012-09-16: qty 5

## 2012-09-16 MED ORDER — FENTANYL CITRATE 0.05 MG/ML IJ SOLN: 25.0000 ug | INTRAMUSCULAR | Status: DC | PRN

## 2012-09-16 MED ORDER — LACTATED RINGERS IV SOLN
INTRAVENOUS | Status: DC | PRN
  Administered 2012-09-16: 08:00:00 via INTRAVENOUS

## 2012-09-16 MED ORDER — ARTIFICIAL TEARS OP OINT
TOPICAL_OINTMENT | OPHTHALMIC | Status: AC
  Filled 2012-09-16: qty 3.5

## 2012-09-16 MED ORDER — BUTAMBEN-TETRACAINE-BENZOCAINE 2-2-14 % EX AERO
2.0000 | INHALATION_SPRAY | Freq: Once | CUTANEOUS | Status: AC
  Administered 2012-09-16: 2 via TOPICAL
  Filled 2012-09-16: qty 56

## 2012-09-16 MED ORDER — ONDANSETRON HCL 4 MG/2ML IJ SOLN: 4.0000 mg | Freq: Once | INTRAMUSCULAR | Status: DC | PRN

## 2012-09-16 MED ORDER — LIDOCAINE HCL (CARDIAC) 10 MG/ML IV SOLN
INTRAVENOUS | Status: DC | PRN
  Administered 2012-09-16: 50 mg via INTRAVENOUS

## 2012-09-16 MED ORDER — ONDANSETRON HCL 4 MG/2ML IJ SOLN
INTRAMUSCULAR | Status: AC
  Filled 2012-09-16: qty 2

## 2012-09-16 MED ORDER — WATER FOR IRRIGATION, STERILE IR SOLN
Status: DC | PRN
  Administered 2012-09-16: 1000 mL

## 2012-09-16 MED ORDER — GLYCOPYRROLATE 0.2 MG/ML IJ SOLN
0.2000 mg | Freq: Once | INTRAMUSCULAR | Status: AC
  Administered 2012-09-16: 0.2 mg via INTRAVENOUS

## 2012-09-16 SURGICAL SUPPLY — 23 items
BLOCK BITE 60FR ADLT L/F BLUE (MISCELLANEOUS) ×1 IMPLANT
ELECT REM PT RETURN 9FT ADLT (ELECTROSURGICAL)
ELECTRODE REM PT RTRN 9FT ADLT (ELECTROSURGICAL) IMPLANT
FCP BXJMBJMB 240X2.8X (CUTTING FORCEPS)
FLOOR PAD 36X40 (MISCELLANEOUS) ×3
FORCEPS BIOP RAD 4 LRG CAP 4 (CUTTING FORCEPS) ×2 IMPLANT
FORCEPS BIOP RJ4 240 W/NDL (CUTTING FORCEPS)
FORCEPS BXJMBJMB 240X2.8X (CUTTING FORCEPS) IMPLANT
INJECTOR/SNARE I SNARE (MISCELLANEOUS) IMPLANT
LUBRICANT JELLY 4.5OZ STERILE (MISCELLANEOUS) ×1 IMPLANT
MANIFOLD NEPTUNE II (INSTRUMENTS) ×3 IMPLANT
NDL SCLEROTHERAPY 25GX240 (NEEDLE) IMPLANT
NEEDLE SCLEROTHERAPY 25GX240 (NEEDLE) IMPLANT
PAD FLOOR 36X40 (MISCELLANEOUS) ×2 IMPLANT
PROBE APC STR FIRE (PROBE) IMPLANT
PROBE INJECTION GOLD (MISCELLANEOUS)
PROBE INJECTION GOLD 7FR (MISCELLANEOUS) IMPLANT
SNARE SHORT THROW 13M SML OVAL (MISCELLANEOUS) ×3 IMPLANT
SYR 50ML LL SCALE MARK (SYRINGE) ×1 IMPLANT
TRAP SPECIMEN MUCOUS 40CC (MISCELLANEOUS) IMPLANT
TUBING ENDO SMARTCAP PENTAX (MISCELLANEOUS) ×1 IMPLANT
TUBING IRRIGATION ENDOGATOR (MISCELLANEOUS) ×1 IMPLANT
WATER STERILE IRR 1000ML POUR (IV SOLUTION) ×2 IMPLANT

## 2012-09-16 NOTE — Op Note (Signed)
[facility] [address] [place], [address]   COLONOSCOPY PROCEDURE REPORT  PATIENT: Jamie Harding, Jamie Harding  MR#: [id number] BIRTHDATE: 1956-01-04 , 56  yrs. old GENDER: Female ENDOSCOPIST: Jonette Eva, MD REFERRED KG:[id number] Gerda Diss, M.D. PROCEDURE DATE:  09/16/2012 PROCEDURE:   Colonoscopy with biopsy INDICATIONS:average risk patient for colon cancer. MEDICATIONS: MAC sedation, administered by CRNA  DESCRIPTION OF PROCEDURE:    Physical exam was performed.  Informed consent was obtained from the patient after explaining the benefits, risks, and alternatives to procedure.  The patient was connected to monitor and placed in left lateral position. Continuous oxygen was provided by nasal cannula and IV medicine administered through an indwelling cannula.  After administration of sedation and rectal exam, the patients rectum was intubated and the     colonoscope was advanced under direct visualization to the cecum.  The scope was removed slowly by carefully examining the color, texture, anatomy, and integrity mucosa on the way out.  The patient was recovered in endoscopy and discharged home in satisfactory condition.       COLON FINDINGS: Five sessile polyps ranging between 3-37mm in size were found in the sigmoid colon and rectum.  A polypectomy was performed with cold forceps.  , Mild diverticulosis was noted throughout the entire examined colon.  , The colon mucosa was otherwise normal.  , and Small internal hemorrhoids were found.  PREP QUALITY: good. CECAL W/D TIME: 18 minutes  COMPLICATIONS: None  ENDOSCOPIC IMPRESSION: 1.   Five sessile polyps ranging between 3-63mm in size were found in the sigmoid colon and rectum; polypectomy was performed with cold forceps 2.   Mild diverticulosis was noted throughout the entire examined colon 3.   The colon mucosa was otherwise normal 4.   Small internal hemorrhoids   RECOMMENDATIONS: Await biopsy HIGH FIBER  DIET LOSE 20 LBS TCS IN 10 YEARS       _______________________________ eSignedJonette Eva, MD 09/16/2012 12:47 PM     PATIENT NAME:  Jamie Harding MR#: [id number]

## 2012-09-16 NOTE — H&P (Addendum)
Primary Care Physician:  [name], MD Primary Gastroenterologist:  Dr. Darrick Penna  Pre-Procedure History & Physical: HPI:  Jamie Harding is a 56 y.o. female here for dysphagia & screening.  Past Medical History  Diagnosis Date  . DM (diabetes mellitus)   . HTN (hypertension)   . Obese   . Depression   . Anxiety   . HLD (hyperlipidemia)   . Hiatal hernia   . Asthma   . GERD (gastroesophageal reflux disease)   . Fatty liver   . Gastritis   . Pancreatitis   . Tibialis tendinitis   . FH: CAD (coronary artery disease)   . Chest pain   . Gastroparesis     NEGATIVE GES 2010  . Rheumatoid arthritis   . Stroke 2008    no deficits  . Neuropathy     Past Surgical History  Procedure Date  . Cesarean section   . Cholecystectomy   . Ankle surgery     remove extra bone-left  . Finger surgery     left little finger  . Lithotripsy   . Dilation and curettage of uterus     multiple  . Laser ablation of the cervix   . Carpal tunnel release     right side  . Tubal ligation   . Gastric emptying 07/27/2009    The amount of activity in the stomach at 120 minutes was 13% which is in the normal range  . Esophagogastroduodenoscopy 07/29/2009    Mild gastritis, benign path    Prior to Admission medications   Medication Sig Start Date End Date Taking? Authorizing Provider  ALPRAZolam Prudy Feeler) 0.5 MG tablet Take 0.5 mg by mouth 2 (two) times daily as needed. For anxiety   Yes Historical Provider, MD  amLODipine (NORVASC) 5 MG tablet Take 5 mg by mouth daily.   Yes Historical Provider, MD  aspirin EC 81 MG tablet Take 81 mg by mouth daily.   Yes Historical Provider, MD  cetirizine (ZYRTEC) 10 MG tablet Take 10 mg by mouth daily.   Yes Historical Provider, MD  esomeprazole (NEXIUM) 40 MG capsule Take 40 mg by mouth 2 (two) times daily.   Yes Historical Provider, MD  Exenatide (BYETTA 10 MCG PEN [place]) Inject 10 mcg into the skin 2 (two) times daily.   Yes Historical Provider, MD    fluticasone (FLONASE) 50 MCG/ACT nasal spray Place 2 sprays into the nose daily.   Yes Historical Provider, MD  furosemide (LASIX) 20 MG tablet Take 20 mg by mouth daily.     Yes Historical Provider, MD  gabapentin (NEURONTIN) 300 MG capsule Take 300 mg by mouth 2 (two) times daily.   Yes Historical Provider, MD  HYDROcodone-acetaminophen (NORCO/VICODIN) 5-325 MG per tablet Take 1 tablet by mouth every 6 (six) hours as needed. For pain   Yes Historical Provider, MD  insulin aspart (NOVOLOG) 100 UNIT/ML injection Inject 5-15 Units into the skin 3 (three) times daily before meals.    Yes Historical Provider, MD  insulin glargine (LANTUS) 100 UNIT/ML injection Inject 80 Units into the skin at bedtime.    Yes Historical Provider, MD  lisinopril (PRINIVIL,ZESTRIL) 20 MG tablet Take 20 mg by mouth daily.     Yes Historical Provider, MD  metFORMIN (GLUCOPHAGE) 1000 MG tablet Take 1,000 mg by mouth 2 (two) times daily with a meal.    Yes Historical Provider, MD  methocarbamol (ROBAXIN) 500 MG tablet Take 1,000 mg by mouth 3 (three) times daily as needed. For muscle  spasms   Yes Historical Provider, MD  Multiple Vitamin (MULTIVITAMIN WITH MINERALS) TABS Take 1 tablet by mouth daily.   Yes Historical Provider, MD  ondansetron (ZOFRAN) 8 MG tablet Take 8 mg by mouth every 12 (twelve) hours as needed. For nausea   Yes Historical Provider, MD  pravastatin (PRAVACHOL) 80 MG tablet Take 80 mg by mouth daily.    Yes Historical Provider, MD  promethazine (PHENERGAN) 25 MG tablet Take 25 mg by mouth 2 (two) times daily as needed. For nausea   Yes Historical Provider, MD  sucralfate (CARAFATE) 1 G tablet Take 1 g by mouth 4 (four) times daily as needed. For upset stomach with meals   Yes Historical Provider, MD  venlafaxine (EFFEXOR) 75 MG tablet Take 75 mg by mouth 2 (two) times daily.    Yes Historical Provider, MD    Allergies as of 08/28/2012 - Review Complete 08/28/2012  Allergen Reaction Noted  .  Azithromycin  07/15/2009  . Morphine    . Naproxen  07/15/2009  . Sulfonamide derivatives      Family History  Problem Relation Age of Onset  . Breast cancer Mother   . Diabetes Father   . Coronary artery disease    . Arthritis    . Asthma    . Colon cancer Neg Hx   . Diabetes Sister     History   Social History  . Marital Status: Married    Spouse Name: N/A    Number of Children: N/A  . Years of Education: N/A   Occupational History  . 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     Social History Main Topics  . Smoking status: Former Smoker -- 0.5 packs/day for 3 years    Types: Cigarettes    Quit date: 09/11/1987  . Smokeless tobacco: Not on file  . Alcohol Use: No  . Drug Use: No  . Sexually Active: Yes    Birth Control/ Protection: Surgical   Other Topics Concern  . Not on file   Social History Narrative  . No narrative on file    Review of Systems: See HPI, otherwise negative ROS   Physical Exam: BP 156/88  Pulse 73  Temp 97.9 F (36.6 C) (Oral)  Resp 23  SpO2 98% General:   Alert,  pleasant and cooperative in NAD Head:  Normocephalic and atraumatic. Neck:  Supple; Lungs:  Clear throughout to auscultation.    Heart:  Regular rate and rhythm. Abdomen:  Soft, nontender and nondistended. Normal bowel sounds, without guarding, and without rebound.   Neurologic:  Alert and  oriented x4;  grossly normal neurologically.  Impression/Plan:    dysphagia & screening.  PLAN: 1. EGD/TCS TODAY

## 2012-09-16 NOTE — Transfer of Care (Signed)
  Anesthesia Post-op Note  Patient: Jamie Harding  Procedure(s) Performed: Procedure(s) (LRB) with comments: COLONOSCOPY WITH PROPOFOL (N/A) - in cecum at 0812, end at 0831 ; total withdrawal time 19 minutes ESOPHAGOGASTRODUODENOSCOPY (EGD) WITH PROPOFOL (N/A) - start at 0838 SAVORY DILATION (N/A) - 16 fr dilation POLYPECTOMY (N/A)  Patient Location: PACU  Anesthesia Type: MAC  Level of Consciousness: awake, alert , oriented and patient cooperative  Airway and Oxygen Therapy: Patient Spontanous Breathing on room air  Post-op Pain: mild  Post-op Assessment: Post-op Vital signs reviewed, Patient's Cardiovascular Status Stable, Respiratory Function Stable, Patent Airway and No signs of Nausea or vomiting  Post-op Vital Signs: Reviewed and stable  Complications: No apparent anesthesia complications

## 2012-09-16 NOTE — Preoperative (Signed)
Beta Blockers   Reason not to administer Beta Blockers:Not Applicable 

## 2012-09-16 NOTE — Op Note (Signed)
[facility] [address] [place], [address]   ENDOSCOPY PROCEDURE REPORT  PATIENT: Jamie Harding, Jamie Harding  MR#: [id number] BIRTHDATE: Aug 26, 1956 , 56  yrs. old GENDER: Female  ENDOSCOPIST: Jonette Eva, MD REFFERED XB:JYNWG Gerda Diss, M.D.  PROCEDURE DATE:  09/16/2012 PROCEDURE:   EGD with biopsy and EGD with dilatation over guidewire   INDICATIONS:1.  dysphagia.   2.  dyspepsia. 3. VOMITING-DM x > 10 YEARS, NOT IDEALLY CONTROLLED MEDICATIONS: MAC sedation, administered by CRNA TOPICAL ANESTHETIC: Cetacaine Spray  DESCRIPTION OF PROCEDURE:   After the risks benefits and alternatives of the procedure were thoroughly explained, informed consent was obtained.  The     endoscope was introduced through the mouth and advanced to the second portion of the duodenum.  The instrument was slowly withdrawn as the mucosa was carefully examined.  Prior to withdrawal of the scope, the guidwire was placed.  The esophagus was dilated successfully.  The patient was recovered in endoscopy and discharged home in satisfactory condition.      ESOPHAGUS: The mucosa of the esophagus appeared normal.  STOMACH: Moderate non-erosive gastritis (inflammation) was found. Multiple biopsies were performed using cold forceps.  DUODENUM: The duodenal mucosa showed no abnormalities in the ampulla and bulb and second portion of the duodenum.   Dilation was then performed at the gastroesphageal junction  Dilator: Savary over guidewire Size(s): 16 mm Resistance: minimal Heme: none  COMPLICATIONS: There were no complications.   ENDOSCOPIC IMPRESSION: 1.   The mucosa of the esophagus appeared normal 2.   Non-erosive gastritis (inflammation) was found; multiple biopsies 3.   The duodenal mucosa showed no abnormalities in the ampulla and bulb and second portion of the duodenum 4.  NAUSEA/VOMITING MOST LIKELY DUE TO GERD/GASTRITIS. DIFFERENTIAL DIAGNOSIS INCLUDES  GASTROPARESIS  RECOMMENDATIONS: Await biopsy.  IF NO H PYLORI, NEEDS GES. CONTINUE NEXIUM BID 30 MINUTES PRIOR TO MEALS LOW FAT/HIGH FIBER DIET OPV IN 4 MOS.  IF DYSPHAGIA PERSISTS, BPE.      _______________________________ Rosalie DoctorJonette Eva, MD 09/16/2012 12:51 PM      PATIENT NAME:  Aquarius, Stokley MR#: [id number]

## 2012-09-16 NOTE — Anesthesia Preprocedure Evaluation (Signed)
Anesthesia Evaluation  Patient identified by MRN, date of birth, ID band Patient awake    Reviewed: Allergy & Precautions, H&P , NPO status , Patient's Chart, lab work & pertinent test results  Airway Mallampati: II      Dental  (+) Partial Lower and Teeth Intact   Pulmonary asthma ,  breath sounds clear to auscultation        Cardiovascular hypertension, Pt. on medications Rhythm:Regular     Neuro/Psych PSYCHIATRIC DISORDERS Anxiety Depression  Neuromuscular disease CVA, No Residual Symptoms    GI/Hepatic hiatal hernia, GERD-  ,  Endo/Other  diabetes, Well Controlled, Type 2  Renal/GU      Musculoskeletal  (+) Arthritis -,   Abdominal   Peds  Hematology   Anesthesia Other Findings   Reproductive/Obstetrics                           Anesthesia Physical Anesthesia Plan  ASA: III  Anesthesia Plan: MAC   Post-op Pain Management:    Induction: Intravenous  Airway Management Planned: Simple Face Mask  Additional Equipment:   Intra-op Plan:   Post-operative Plan:   Informed Consent: I have reviewed the patients History and Physical, chart, labs and discussed the procedure including the risks, benefits and alternatives for the proposed anesthesia with the patient or authorized representative who has indicated his/her understanding and acceptance.     Plan Discussed with:   Anesthesia Plan Comments:         Anesthesia Quick Evaluation

## 2012-09-16 NOTE — Progress Notes (Signed)
Dr Jayme Cloud notified of 79 FSBS. Order given for 50% dextrose 25 ml IV now.

## 2012-09-16 NOTE — Anesthesia Procedure Notes (Signed)
Procedure Name: MAC Performed by: Carolyne Littles, Jalina Blowers L Pre-anesthesia Checklist: Patient identified, Patient being monitored, Emergency Drugs available, Timeout performed and Suction available Oxygen Delivery Method: Simple face mask

## 2012-09-16 NOTE — Anesthesia Postprocedure Evaluation (Signed)
  Anesthesia Post-op Note  Patient: Jamie Harding  Procedure(s) Performed: Procedure(s) (LRB) with comments: COLONOSCOPY WITH PROPOFOL (N/A) - in cecum at 0812, end at 0831 ; total withdrawal time 19 minutes ESOPHAGOGASTRODUODENOSCOPY (EGD) WITH PROPOFOL (N/A) - start at 0838 SAVORY DILATION (N/A) - 16 fr dilation POLYPECTOMY (N/A)  Patient Location: PACU  Anesthesia Type: MAC  Level of Consciousness: awake, alert , oriented and patient cooperative  Airway and Oxygen Therapy: Patient Spontanous Breathing on room air  Post-op Pain: mild  Post-op Assessment: Post-op Vital signs reviewed, Patient's Cardiovascular Status Stable, Respiratory Function Stable, Patent Airway and No signs of Nausea or vomiting  Post-op Vital Signs: Reviewed and stable  Complications: No apparent anesthesia complications  

## 2012-09-17 ENCOUNTER — Other Ambulatory Visit: Payer: Self-pay | Admitting: Gastroenterology

## 2012-09-17 ENCOUNTER — Telehealth: Payer: Self-pay | Admitting: Gastroenterology

## 2012-09-17 NOTE — Telephone Encounter (Signed)
Pt is aware of OV on 2/27 at 10 with SF in E30 and reminder in epic to have tcs in 10 years

## 2012-09-17 NOTE — Telephone Encounter (Addendum)
Please call pt. HER stomach Bx shows mild gastritis. She had HYPERPLASTIC POLYPS removed from her colon.    SHE NEEDS A GASTRIC EMPTYING STUDY TO COMPLETE THE EVALUATION FOR HER VOMITING.  CONTINUE NEXIUM 30  MINUTES PRIOR TO YOUR MEALS TWICE DAILY.   FOLLOW A HIGH FIBER/LOW FAT DIET. AVOID ITEMS THAT CAUSE BLOATING.   LOSE 20 LBS.   Follow up in 4 mos E30 VOMITING, DYSPHAGIA.  Next colonoscopy in 10 years.

## 2012-09-17 NOTE — Telephone Encounter (Signed)
Path faxed to PCP 

## 2012-09-18 ENCOUNTER — Other Ambulatory Visit: Payer: Self-pay | Admitting: Gastroenterology

## 2012-09-18 ENCOUNTER — Encounter (HOSPITAL_COMMUNITY): Payer: Self-pay | Admitting: Gastroenterology

## 2012-09-18 DIAGNOSIS — R1110 Vomiting, unspecified: Secondary | ICD-10-CM

## 2012-09-18 NOTE — Telephone Encounter (Signed)
Patient is scheduled for GES on Tuesday Nov 5th at 8:00 and I have called Mrs Fairclough and left her a message on her machine to return my call to confirm

## 2012-09-23 ENCOUNTER — Encounter: Payer: Self-pay | Admitting: Gastroenterology

## 2012-09-23 ENCOUNTER — Encounter (HOSPITAL_COMMUNITY): Payer: Self-pay

## 2012-09-23 ENCOUNTER — Ambulatory Visit (HOSPITAL_COMMUNITY)
Admission: RE | Admit: 2012-09-23 | Discharge: 2012-09-23 | Disposition: A | Discharge status: Home / Self Care | Payer: 59 | Source: Ambulatory Visit | Attending: Gastroenterology | Admitting: Gastroenterology

## 2012-09-23 DIAGNOSIS — E119 Type 2 diabetes mellitus without complications: Secondary | ICD-10-CM | POA: Insufficient documentation

## 2012-09-23 DIAGNOSIS — R112 Nausea with vomiting, unspecified: Secondary | ICD-10-CM | POA: Insufficient documentation

## 2012-09-23 DIAGNOSIS — R6881 Early satiety: Secondary | ICD-10-CM | POA: Insufficient documentation

## 2012-09-23 DIAGNOSIS — R1110 Vomiting, unspecified: Secondary | ICD-10-CM

## 2012-09-23 DIAGNOSIS — R109 Unspecified abdominal pain: Secondary | ICD-10-CM | POA: Insufficient documentation

## 2012-09-23 MED ORDER — TECHNETIUM TC 99M SULFUR COLLOID
2.0000 | Freq: Once | INTRAVENOUS | Status: AC | PRN
  Administered 2012-09-23: 2 via INTRAVENOUS

## 2012-09-23 NOTE — Telephone Encounter (Signed)
ERROR

## 2012-09-23 NOTE — Telephone Encounter (Signed)
LMOM to call.

## 2012-09-23 NOTE — Telephone Encounter (Signed)
PLEASE CALL PT.  HER GASTRIC EMPTYING TEST IS NORMAL. HER VOMITING IS MOST LIKELY DUE TO UNCONTROLLED REFLUX. SHE NEEDS TO LOSE WEIGHT. SHE SHOULD LOSE 10 LBS BY THE TIME SHE SEE ME NEXT FEB. SHE IS CURRENTLY 100 LBS OVER HER IDEAL BODY WEIGHT. SHE SHOULD FOLLOW A LOW FAT DIET.

## 2012-09-24 NOTE — Telephone Encounter (Signed)
LMOM to call. ( Dexilant samples and low fat diet at front for pt to pick up).

## 2012-09-24 NOTE — Telephone Encounter (Signed)
REVIEWED.  

## 2012-09-24 NOTE — Telephone Encounter (Signed)
Pt returned call and was informed of all results. She wants to know if she should try something other than Nexium. She has been taking that twice a day since September. She is still having nausea/vomiting and would like advise!

## 2012-09-24 NOTE — Telephone Encounter (Addendum)
PLEASE CALL PT. SHE MAY PICK UP SAMPLES OF DEXILANT #14 AND TRY THEM. CONTINUE A LOW FAT DIET.

## 2012-09-24 NOTE — Telephone Encounter (Signed)
Pt returned call and was informed.  

## 2012-09-24 NOTE — Telephone Encounter (Signed)
LMOM to call.

## 2012-09-25 ENCOUNTER — Other Ambulatory Visit: Payer: Self-pay | Admitting: Gastroenterology

## 2012-09-25 ENCOUNTER — Other Ambulatory Visit: Payer: Self-pay

## 2012-09-25 ENCOUNTER — Telehealth: Payer: Self-pay

## 2012-09-25 ENCOUNTER — Ambulatory Visit (HOSPITAL_COMMUNITY)
Admission: RE | Admit: 2012-09-25 | Discharge: 2012-09-25 | Disposition: A | Discharge status: Home / Self Care | Payer: 59 | Source: Ambulatory Visit | Attending: Gastroenterology | Admitting: Gastroenterology

## 2012-09-25 DIAGNOSIS — R112 Nausea with vomiting, unspecified: Secondary | ICD-10-CM

## 2012-09-25 DIAGNOSIS — E7889 Other lipoprotein metabolism disorders: Secondary | ICD-10-CM | POA: Insufficient documentation

## 2012-09-25 DIAGNOSIS — N200 Calculus of kidney: Secondary | ICD-10-CM | POA: Insufficient documentation

## 2012-09-25 MED ORDER — PROMETHAZINE HCL 25 MG PO TABS: ORAL_TABLET | ORAL | Status: DC

## 2012-09-25 MED ORDER — IOHEXOL 300 MG/ML  SOLN
100.0000 mL | Freq: Once | INTRAMUSCULAR | Status: AC | PRN
  Administered 2012-09-25: 100 mL via INTRAVENOUS

## 2012-09-25 NOTE — Telephone Encounter (Signed)
PLEASE CALL PT.  Her ct scan shows kidney stones, and mild diverticulosis. HER URINE RESULTS ARE NOT BACK YET.

## 2012-09-25 NOTE — Telephone Encounter (Signed)
I have called the patient and informed her that she needed to go ahead to [facility] now and they are expecting her

## 2012-09-25 NOTE — Telephone Encounter (Signed)
REVIEWED.  

## 2012-09-25 NOTE — Telephone Encounter (Signed)
Called and informed pt.  

## 2012-09-25 NOTE — Telephone Encounter (Signed)
Called and informed pt. Lab order for UA faxed to [facility]. Appt made for follow-up on Tues, per pt request, so she would not have to miss work. ( OV with AS on 09/30/2012 at 9:00 AM). Pt aware she needs CT and that Soledad Gerlach will be calling her in reference to that shortly.

## 2012-09-25 NOTE — Telephone Encounter (Signed)
PLEASE CALL PT.  SHE NEEDS TO FOLLOW A FULL LIQUID DIET FOR THE NEXT 3 DAYS.   SHE NEEDS TO TAKE PHENERGAN AS NEEDED FOR NAUSEA OR VOMITING. I SENT HER PRESCRIPTION TO [facility]. CONTINUE DEXILANT.   SHE SHOULD HAVE A UA/MICRO & CT ABD/PELVIS TODAY FOR VOMITING/DIARRHEA/DECREASED APPETITE. SCHEDULE OPV WITH AN EXTENDER NEXT WED, THUR, OR FRI.  IF SHE IS UNABLE TO KEEP ANYTHING DOWN, SHE NEEDS TO GO TO THE ED.

## 2012-09-25 NOTE — Telephone Encounter (Signed)
Pt called and said she had a very bad episode yesterday, worst she has ever had. Abdominal pain/cramping, diarrhea x 8-10 times, nausea and vomiting bile. She only had a glass of water and a serving of oatmeal yesterday. She is feeling some better this AM, but very weak and still nauseated and has not eaten anything yet. ( She picked up the Dexilant yesterday and is starting that today. Please advise!

## 2012-09-26 LAB — URINALYSIS, MICROSCOPIC ONLY
Casts: NONE SEEN
Crystals: NONE SEEN
Squamous Epithelial / LPF: NONE SEEN

## 2012-09-26 LAB — URINALYSIS, ROUTINE W REFLEX MICROSCOPIC
Glucose, UA: NEGATIVE mg/dL
Protein, ur: NEGATIVE mg/dL
Urobilinogen, UA: 0.2 mg/dL (ref 0.0–1.0)

## 2012-09-26 LAB — BASIC METABOLIC PANEL
CO2: 24 mEq/L (ref 19–32)
Calcium: 9.5 mg/dL (ref 8.4–10.5)
Sodium: 138 mEq/L (ref 135–145)

## 2012-09-26 NOTE — Telephone Encounter (Signed)
PLEASE CALL PT. HER URINE TEST IS NORMAL. 

## 2012-09-26 NOTE — Telephone Encounter (Signed)
Pt aware of results of her urine test

## 2012-09-27 NOTE — Progress Notes (Signed)
REVIEWED.  

## 2012-09-30 ENCOUNTER — Ambulatory Visit: Payer: 59 | Admitting: Gastroenterology

## 2012-10-01 ENCOUNTER — Other Ambulatory Visit: Payer: Self-pay | Admitting: Family Medicine

## 2012-10-01 ENCOUNTER — Ambulatory Visit (HOSPITAL_COMMUNITY)
Admission: RE | Admit: 2012-10-01 | Discharge: 2012-10-01 | Disposition: A | Discharge status: Home / Self Care | Payer: 59 | Source: Ambulatory Visit | Attending: Family Medicine | Admitting: Family Medicine

## 2012-10-01 DIAGNOSIS — M25559 Pain in unspecified hip: Secondary | ICD-10-CM | POA: Insufficient documentation

## 2012-10-01 DIAGNOSIS — M25551 Pain in right hip: Secondary | ICD-10-CM

## 2012-10-01 DIAGNOSIS — M25579 Pain in unspecified ankle and joints of unspecified foot: Secondary | ICD-10-CM | POA: Insufficient documentation

## 2012-10-01 DIAGNOSIS — M25572 Pain in left ankle and joints of left foot: Secondary | ICD-10-CM

## 2012-10-09 ENCOUNTER — Telehealth: Payer: Self-pay | Admitting: *Deleted

## 2012-10-09 NOTE — Telephone Encounter (Signed)
Ms Waren called today. She cancelled her appt with Dr Darrick Penna in February and is transferring her care to Dr Juanda Chance at [facility].

## 2012-10-10 NOTE — Telephone Encounter (Signed)
REVIEWED.  

## 2012-10-10 NOTE — Telephone Encounter (Signed)
I am sorry, I will not be able to see her. DB

## 2012-10-10 NOTE — Telephone Encounter (Signed)
Dr. Juanda Chance has declined patient.Do not schedule with [facility].

## 2012-12-15 ENCOUNTER — Encounter: Payer: Self-pay | Admitting: Cardiology

## 2012-12-16 ENCOUNTER — Encounter: Payer: Self-pay | Admitting: Cardiology

## 2012-12-16 DIAGNOSIS — R0789 Other chest pain: Secondary | ICD-10-CM | POA: Insufficient documentation

## 2012-12-16 DIAGNOSIS — R9430 Abnormal result of cardiovascular function study, unspecified: Secondary | ICD-10-CM | POA: Insufficient documentation

## 2012-12-17 ENCOUNTER — Ambulatory Visit (INDEPENDENT_AMBULATORY_CARE_PROVIDER_SITE_OTHER): Payer: 59 | Admitting: Cardiology

## 2012-12-17 ENCOUNTER — Encounter: Payer: Self-pay | Admitting: Cardiology

## 2012-12-17 VITALS — BP 118/82 | HR 71 | Ht 62.0 in | Wt 241.0 lb

## 2012-12-17 DIAGNOSIS — I10 Essential (primary) hypertension: Secondary | ICD-10-CM

## 2012-12-17 DIAGNOSIS — E785 Hyperlipidemia, unspecified: Secondary | ICD-10-CM

## 2012-12-17 DIAGNOSIS — R079 Chest pain, unspecified: Secondary | ICD-10-CM

## 2012-12-17 MED ORDER — AMLODIPINE BESYLATE 10 MG PO TABS: 10.0000 mg | ORAL_TABLET | Freq: Every day | ORAL | Status: DC

## 2012-12-17 NOTE — Assessment & Plan Note (Addendum)
I have had a long and careful discussion with the patient. I am not convinced that she is having any significant ischemic pain at this time. She had exercise studies in 2012 that showed no ischemia. She has a history of normal left jugular function. I've chosen not to do any further aggressive workup at this time. I will review the EKG as soon as we get it from her primary care office. I will also schedule her to come back to the office for further followup so that we can reassess the question of chest discomfort again. If over time it seems to be more consistent with ischemia, a more aggressive workup will be done.  As part of today's evaluation I spent greater than 25 minutes reviewing all the data in seeing the patient. More than half of the 25 minutes was spent with direct contact with the patient and her husband. She is in agreement with me at the approach that I have outlined.  3:19PM I outlined in the first lines at the top of this page and I have now received excellent information about the patient. The blood tests showed no evidence of ischemia. EKG was normal.

## 2012-12-17 NOTE — Assessment & Plan Note (Signed)
The patient had increasing blood pressure that has responded nicely to a higher dose of amlodipine. I am changing her dose to 10 mg daily as of today.

## 2012-12-17 NOTE — Assessment & Plan Note (Signed)
Patient is receiving a statin.

## 2012-12-17 NOTE — Progress Notes (Signed)
HPI  The patient is seen today for the reassessment of her cardiac status. I saw her last February, 2012. Over time there has been no proven coronary disease. Most recently the patient had increased blood pressure and had some headaches. She was seen her primary care office. She received extra amlodipine and she has had a good blood pressure response and her headaches have decreased. She mentioned when in the primary care office that she was having some chest tightness. She was not overly concerned about it. However she has significant risk factors. An EKG was done and the patient tells me it was normal. I have not yet seen it but we will look for it by fax and I will be sure to review it. When I saw her in 2012 the patient had an echo showing normal left ventricular function. She had a nuclear scan revealing some breast attenuation but no definite scar or ischemia.  The patient's chest discomfort is not typical of angina. She feels some discomfort across the front of her chest and around to the back of her chest. There is no radiation to the arm or the jaw. There is no exertional component. It comes and goes over the day.  3:16PM the patient is gone from the office. I have now received excellent information from Dr.Luking's office. It appears that they tried to send to [name] yesterday but her fax machines were busy. I appreciate the information. There is an EKG that is normal. Hemoglobin is normal. D-dimer is normal. Renal function is normal. CPK and troponin are normal. BNP is normal    Allergies  Allergen Reactions  . Naproxen Nausea And Vomiting  . Azithromycin Nausea And Vomiting and Rash  . Morphine Hives and Rash  . Sulfonamide Derivatives Nausea And Vomiting and Rash    Current Outpatient Prescriptions  Medication Sig Dispense Refill  . ALPRAZolam (XANAX) 0.5 MG tablet Take 0.5 mg by mouth 2 (two) times daily as needed. For anxiety      . amLODipine (NORVASC) 5 MG tablet Take 5 mg by  mouth daily.      Marland Kitchen aspirin EC 81 MG tablet Take 81 mg by mouth daily.      . cetirizine (ZYRTEC) 10 MG tablet Take 10 mg by mouth daily.      . Cholecalciferol (VITAMIN D PO) Take 1,000 Units by mouth 2 (two) times daily.      . cyanocobalamin 1000 MCG tablet Take 100 mcg by mouth daily.      Marland Kitchen esomeprazole (NEXIUM) 40 MG capsule Take 40 mg by mouth daily before breakfast.       . fluticasone (FLONASE) 50 MCG/ACT nasal spray Place 2 sprays into the nose daily.      . furosemide (LASIX) 20 MG tablet Take 20 mg by mouth daily.        Marland Kitchen gabapentin (NEURONTIN) 300 MG capsule Take 300 mg by mouth 3 (three) times daily.       Marland Kitchen HYDROcodone-acetaminophen (NORCO/VICODIN) 5-325 MG per tablet Take 1 tablet by mouth every 6 (six) hours as needed. For pain      . insulin aspart (NOVOLOG) 100 UNIT/ML injection Inject into the skin. SLIDING SCALE      . insulin glargine (LANTUS) 100 UNIT/ML injection Inject 85 Units into the skin at bedtime.       Marland Kitchen lisinopril (PRINIVIL,ZESTRIL) 20 MG tablet Take 20 mg by mouth daily.        . metFORMIN (GLUCOPHAGE) 1000 MG tablet  Take 1,000 mg by mouth 2 (two) times daily with a meal.       . methocarbamol (ROBAXIN) 500 MG tablet Take 1,000 mg by mouth 3 (three) times daily as needed. For muscle spasms      . Multiple Vitamin (MULTIVITAMIN WITH MINERALS) TABS Take 1 tablet by mouth daily.      . ondansetron (ZOFRAN) 8 MG tablet Take 8 mg by mouth every 12 (twelve) hours as needed. For nausea      . pravastatin (PRAVACHOL) 80 MG tablet Take 80 mg by mouth daily.       . promethazine (PHENERGAN) 25 MG tablet 1/2 TO 1 EVERY 6 HOURS AS NEEDED For nausea/VOMITING  30 tablet  1  . pyridOXINE (VITAMIN B-6) 100 MG tablet Take 100 mg by mouth daily.      Marland Kitchen venlafaxine (EFFEXOR) 75 MG tablet Take 75 mg by mouth 2 (two) times daily.        Current Facility-Administered Medications  Medication Dose Route Frequency Provider Last Rate Last Dose  . methylPREDNISolone acetate  (DEPO-MEDROL) injection 40 mg  40 mg Intra-articular Once Vickki Hearing, MD        History   Social History  . Marital Status: Married    Spouse Name: N/A    Number of Children: N/A  . Years of Education: N/A   Occupational History  . 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)  .     Social History Main Topics  . Smoking status: Former Smoker -- 0.5 packs/day for 3 years    Types: Cigarettes    Quit date: 09/11/1987  . Smokeless tobacco: Not on file  . Alcohol Use: No  . Drug Use: No  . Sexually Active: Yes    Birth Control/ Protection: Surgical   Other Topics Concern  . Not on file   Social History Narrative  . No narrative on file    Family History  Problem Relation Age of Onset  . Breast cancer Mother   . Diabetes Father   . Coronary artery disease    . Arthritis    . Asthma    . Colon cancer Neg Hx   . Diabetes Sister     Past Medical History  Diagnosis Date  . DM (diabetes mellitus)   . HTN (hypertension)   . Obese   . Depression   . Anxiety   . HLD (hyperlipidemia)   . Hiatal hernia   . Asthma   . GERD (gastroesophageal reflux disease)   . Fatty liver   . Gastritis   . Pancreatitis   . Tibialis tendinitis   . Chest pain     Nuclear  February, 2012,,  breast attenuation, no definite scar or ischemia  . Gastroparesis     NEGATIVE GES 2010  . Rheumatoid arthritis   . Stroke 2008    no deficits  . Neuropathy   . Ejection fraction     EF 60%, echo, February, 2012, mild LVH    Past Surgical History  Procedure Date  . Cesarean section   . Cholecystectomy   . Ankle surgery     remove extra bone-left  . Finger surgery     left little finger  . Lithotripsy   . Dilation and curettage of uterus     multiple  . Laser ablation of the cervix   . Carpal tunnel release     right side  . Tubal ligation   .  Gastric emptying 07/27/2009    The amount of activity in the stomach at 120 minutes was 13% which is in the normal range  .  Esophagogastroduodenoscopy 07/29/2009    Mild gastritis, benign path  . Colonoscopy with propofol 09/16/2012    Procedure: COLONOSCOPY WITH PROPOFOL;  Surgeon: West Bali, MD;  Location: [facility];  Service: Endoscopy;  Laterality: N/A;  in cecum at 0812, end at 0831 ; total withdrawal time 19 minutes  . Esophagogastroduodenoscopy (egd) with propofol 09/16/2012    Procedure: ESOPHAGOGASTRODUODENOSCOPY (EGD) WITH PROPOFOL;  Surgeon: West Bali, MD;  Location: [facility];  Service: Endoscopy;  Laterality: N/A;  start at [phone]  . Savory dilation 09/16/2012    Procedure: SAVORY DILATION;  Surgeon: West Bali, MD;  Location: [facility];  Service: Endoscopy;  Laterality: N/A;  16 fr dilation  . Polypectomy 09/16/2012    Procedure: POLYPECTOMY;  Surgeon: West Bali, MD;  Location: [facility];  Service: Endoscopy;  Laterality: N/A;    Patient Active Problem List  Diagnosis  . DM  . HYPERLIPIDEMIA  . ANXIETY  . DEPRESSION  . HYPERTENSION  . ASTHMA, UNSPECIFIED  . GASTROPARESIS  . HIATAL HERNIA  . FATTY LIVER DISEASE  . TIBIALIS TENDINITIS  . TRIGGER FINGER, LEFT MIDDLE  . TRIGGER FINGER  . DYSPHAGIA UNSPECIFIED  . PANCREATITIS, HX OF  . GASTRITIS, HX OF  . Acquired trigger finger  . Plantar fasciitis  . Dyspepsia  . Encounter for screening colonoscopy  . Chest pain  . Ejection fraction    ROS   Patient denies fever, chills, headache, sweats, rash, change in vision, change in hearing, cough, nausea vomiting, urinary symptoms. All other systems are reviewed and are negative.  PHYSICAL EXAM  Patient is oriented to person time and place. Affect is normal. She is here with her husband. She is overweight. She stable. There is no jugular venous distention. Lungs are clear. Respiratory effort is nonlabored. Cardiac exam reveals S1 and S2. There no clicks or significant murmurs. The abdomen is soft. There is no peripheral edema. There are no musculoskeletal deformities. There are no skin  rashes.  Filed Vitals:   12/17/12 1357  BP: 118/82  Pulse: 71  Height: 5\' 2"  (1.575 m)  Weight: 241 lb (109.317 kg)  SpO2: 97%   EKG was not done today because it was done in the primary care office and reported to me as normal by the patient. We are currently contacting her primary care office to obtain copies of her recent records. I will review these when they arrived.  ASSESSMENT & PLAN

## 2012-12-17 NOTE — Patient Instructions (Addendum)
Your physician recommends that you schedule a follow-up appointment in: 6 weeks  Your physician has recommended you make the following change in your medication: Increase your amlodipine to 10 mg daily (Take two tabs once daily until you run out and then take one tab daily of the new prescription).

## 2012-12-24 ENCOUNTER — Ambulatory Visit: Payer: 59 | Admitting: Orthopedic Surgery

## 2012-12-26 ENCOUNTER — Encounter: Payer: Self-pay | Admitting: Orthopedic Surgery

## 2012-12-26 ENCOUNTER — Encounter: Payer: Self-pay | Admitting: Cardiology

## 2013-01-03 ENCOUNTER — Other Ambulatory Visit: Payer: Self-pay

## 2013-01-15 ENCOUNTER — Ambulatory Visit: Payer: 59 | Admitting: Gastroenterology

## 2013-01-29 ENCOUNTER — Ambulatory Visit (INDEPENDENT_AMBULATORY_CARE_PROVIDER_SITE_OTHER): Payer: 59 | Admitting: Orthopedic Surgery

## 2013-01-29 ENCOUNTER — Encounter: Payer: Self-pay | Admitting: Orthopedic Surgery

## 2013-01-29 VITALS — HR 80 | Ht 62.0 in | Wt 235.0 lb

## 2013-01-29 DIAGNOSIS — G5602 Carpal tunnel syndrome, left upper limb: Secondary | ICD-10-CM

## 2013-01-29 DIAGNOSIS — M7061 Trochanteric bursitis, right hip: Secondary | ICD-10-CM

## 2013-01-29 DIAGNOSIS — M7060 Trochanteric bursitis, unspecified hip: Secondary | ICD-10-CM | POA: Insufficient documentation

## 2013-01-29 DIAGNOSIS — G5600 Carpal tunnel syndrome, unspecified upper limb: Secondary | ICD-10-CM

## 2013-01-29 DIAGNOSIS — M7750 Other enthesopathy of unspecified foot: Secondary | ICD-10-CM

## 2013-01-29 DIAGNOSIS — M25572 Pain in left ankle and joints of left foot: Secondary | ICD-10-CM

## 2013-01-29 DIAGNOSIS — M25579 Pain in unspecified ankle and joints of unspecified foot: Secondary | ICD-10-CM | POA: Insufficient documentation

## 2013-01-29 DIAGNOSIS — M6530 Trigger finger, unspecified finger: Secondary | ICD-10-CM

## 2013-01-29 NOTE — Patient Instructions (Signed)
1. Carpal tunnel release   2. Left long finger trigger finger release   Date to be determined

## 2013-01-29 NOTE — Progress Notes (Signed)
Patient ID: Jamie Harding, female   DOB: 1956/07/09, 57 y.o.   MRN: [id number] Chief Complaint  Patient presents with  . Hip Pain    Right hip pain, no injury.  . Ankle Pain    Left ankle pain, no injury.    History the patient is 57 years old she is currently under treatment for lower back pain which appears to be mechanical and she is to start therapy soon. She comes in today for consultation regarding her left hand right hip and left ankle. I've been seeing her and following her for trigger finger in the past. She continues to complain of that in the left long finger.  She also complains of pain in her left ankle after left ankle surgery by Dr. Pricilla Holm for which she says a bone was removed. She has lateral pain and swelling. The ankle seems to hurt more when she is ambulating and she reports swelling along the lateral inferior malleolar area laterally.  The right hip is also bothering her over the greater trochanter she has some back pain but it appears to be unrelated. The pain is sharp throbbing stabbing and burning she rates it at 10 just some numbness and tingling as well as locking and catching related to the area as well. She does not have groin or anterior thigh pain.  She also knows she can't put her socks on on the right leg she can on the left  Medications her Norco and gabapentin  She's worn a carpal tunnel splint in the past.  Her review of systems is positive for fatigue and blurred vision, chest pain and shortness of breath, heartburn and nausea, denies genitourinary complaints  Complains of joint pain and swelling stiffness and muscle pain poor healing numbness tingling in the left hand anxiety and depression and seasonal allergies denies easy bleeding or bruising or excessive thirst.  Her medical history is reviewed and recorded  Past Medical History  Diagnosis Date  . DM (diabetes mellitus)   . HTN (hypertension)   . Obese   . Depression   . Anxiety   . HLD  (hyperlipidemia)   . Hiatal hernia   . Asthma   . GERD (gastroesophageal reflux disease)   . Fatty liver   . Gastritis   . Pancreatitis   . Tibialis tendinitis   . Chest pain     Nuclear  February, 2012,,  breast attenuation, no definite scar or ischemia  . Gastroparesis     NEGATIVE GES 2010  . Rheumatoid arthritis   . Stroke 2008    no deficits  . Neuropathy   . Ejection fraction     EF 60%, echo, February, 2012, mild LVH   Pulse 80  Ht 5\' 2"  (1.575 m)  Wt 235 lb (106.595 kg)  BMI 42.97 kg/m2  She does have a large BMI is obesity as a factor. The diabetes is a factor as well.  She is oriented x3 her mood and affect are normal she seems to have a reasonable ambulation pattern.  The left hip no tenderness full range of motion no swelling joint is stable strength is normal skin is intact normal distal pulses and temperature no edema or swelling no lymphadenopathy in the groin sensations intact straight leg and pathologic reflexes are normal balance is normal as well  The right lower extremity is tender over the greater trochanter painful external rotation no pain on internal rotation. Stability normal. Muscle tone normal. Skin normal. Sensation normal. Straight leg  raise is negative. Sensation coordination and balance normal pulse and temperature normal  Right upper extremity no contracture subluxation atrophy tremor or tenderness  Left upper extremity tenderness over the A1 pulley of the left long finger with tenderness over the carpal tunnel decreased sensation in the small ring and long finger. Wrist range of motion and stability are normal grip strength is slightly diminished skin no rash. Tender to palpation over the carpal tunnel.  X-rays were done at the hospital they included a hip and ankle and they are essentially normal except for some really nonspecific changes which do not warrant any further intervention.  She does have sinus tarsi syndrome  Addendum left ankle  exam she has bilateral flat feet left worse than right with a scar posterior to the malleolus and in line with the Achilles tendon a curved and then comes distally. The scars nontender negative Tinel's  There is pain in the sinus Tarsi and swelling which may represent ganglion cyst and fat pad. Ankle range of motion is otherwise normal and stability is normal  Sinus tarsi syndrome, left  Greater trochanteric bursitis, right  Acquired trigger finger  Carpal tunnel syndrome of left wrist    She would like to proceed with trigger release left long finger and carpal tunnel release left wrist

## 2013-02-02 ENCOUNTER — Encounter (HOSPITAL_COMMUNITY): Payer: Self-pay | Admitting: Pharmacy Technician

## 2013-02-02 ENCOUNTER — Other Ambulatory Visit: Payer: Self-pay | Admitting: *Deleted

## 2013-02-03 ENCOUNTER — Encounter (HOSPITAL_COMMUNITY)
Admission: RE | Admit: 2013-02-03 | Discharge: 2013-02-03 | Disposition: A | Discharge status: Home / Self Care | Payer: 59 | Source: Ambulatory Visit | Attending: Orthopedic Surgery | Admitting: Orthopedic Surgery

## 2013-02-03 ENCOUNTER — Encounter (HOSPITAL_COMMUNITY): Payer: Self-pay

## 2013-02-03 LAB — BASIC METABOLIC PANEL
BUN: 12 mg/dL (ref 6–23)
Chloride: 101 mEq/L (ref 96–112)
Glucose, Bld: 258 mg/dL — ABNORMAL HIGH (ref 70–99)
Potassium: 4.6 mEq/L (ref 3.5–5.1)

## 2013-02-03 NOTE — Progress Notes (Signed)
02/03/13 1324  OBSTRUCTIVE SLEEP APNEA  Have you ever been diagnosed with sleep apnea through a sleep study? No  Do you snore loudly (loud enough to be heard through closed doors)?  1  Do you often feel tired, fatigued, or sleepy during the daytime? 1  Has anyone observed you stop breathing during your sleep? 0  Do you have, or are you being treated for high blood pressure? 1  Age over 55 years old? 1  Neck circumference greater than 40 cm/18 inches? 0  Gender: 0  Obstructive Sleep Apnea Score 4

## 2013-02-03 NOTE — Patient Instructions (Addendum)
Jamie Harding  02/03/2013   Your procedure is scheduled on:  February 06, 2013  Report to [facility] at 9:00 AM.  Call this number if you have problems the morning of surgery: [phone]   Remember:  TAKE ONLY 1/2 DOSE OF LANTUS ON NIGHT BEFORE SURGERY (March 20)   Do not eat food or drink liquids after midnight.   Take these medicines the morning of surgery with A SIP OF WATER: xanax, parafon, nexium, lisinopril, robaxin, zofran, effexor, oxycodone, norvasc, gabapentin   Do not wear jewelry, make-up or nail polish.  Do not wear lotions, powders, or perfumes.   Do not shave 48 hours prior to surgery.  Do not bring valuables to the hospital.  Contacts, dentures or bridgework may not be worn into surgery.  Leave suitcase in the car. After surgery it may be brought to your room.  For patients admitted to the hospital, checkout time is 11:00 AM the day of  discharge.   Patients discharged the day of surgery will not be allowed to drive  home.  Name and phone number of your driver: Family or Friend  Special Instructions: Shower using CHG 2 nights before surgery and the night before surgery.  If you shower the day of surgery use CHG.  Use special wash - you have one bottle of CHG for all showers.  You should use approximately 1/3 of the bottle for each shower.   Please read over the following fact sheets that you were given: Pain Booklet, Coughing and Deep Breathing, MRSA Information, Surgical Site Infection Prevention, Anesthesia Post-op Instructions and Care and Recovery After Surgery   Carpal Tunnel Release Carpal tunnel release is done to relieve the pressure on the nerves and tendons on the bottom side of your wrist.  LET YOUR CAREGIVER KNOW ABOUT:   Allergies to food or medicine.  Medicines taken, including vitamins, herbs, eyedrops, over-the-counter medicines, and creams.  Use of steroids (by mouth or creams).  Previous problems with anesthetics or numbing medicines.  History of  bleeding problems or blood clots.  Previous surgery.  Other health problems, including diabetes and kidney problems.  Possibility of pregnancy, if this applies. RISKS AND COMPLICATIONS  Some problems that may happen after this procedure include:  Infection.  Damage to the nerves, arteries or tendons could occur. This would be very uncommon.  Bleeding. BEFORE THE PROCEDURE   This surgery may be done while you are asleep (general anesthetic) or may be done under a block where only your forearm and the surgical area is numb.  If the surgery is done under a block, the numbness will gradually wear off within several hours after surgery. HOME CARE INSTRUCTIONS   Have a responsible person with you for 24 hours.  Do not drive a car or use public transportation for 24 hours.  Only take over-the-counter or prescription medicines for pain, discomfort, or fever as directed by your caregiver. Take them as directed.  You may put ice on the palm side of the affected wrist.  Put ice in a plastic bag.  Place a towel between your skin and the bag.  Leave the ice on for 20 to 30 minutes, 4 times per day.  If you were given a splint to keep your wrist from bending, use it as directed. It is important to wear the splint at night or as directed. Use the splint for as long as you have pain or numbness in your hand, arm, or wrist. This may take  1 to 2 months.  Keep your hand raised (elevated) above the level of your heart as much as possible. This keeps swelling down and helps with discomfort.  Change bandages (dressings) as directed.  Keep the wound clean and dry. SEEK MEDICAL CARE IF:   You develop pain not relieved with medications.  You develop numbness of your hand.  You develop bleeding from your surgical site.  You have an oral temperature above 102 F (38.9 C).  You develop redness or swelling of the surgical site.  You develop new, unexplained problems. SEEK IMMEDIATE  MEDICAL CARE IF:   You develop a rash.  You have difficulty breathing.  You develop any reaction or side effects to medications given.  Trigger Finger Trigger finger (digital tendinitis and stenosing tenosynovitis) is a common disorder that causes an often painful catching of the fingers or thumb. It occurs as a clicking, snapping or locking of a finger in the palm of the hand. The reason for this is that there is a problem with the tendons which flex the fingers sliding smoothly through their sheaths. The cause of this may be inflammation of the tendon and sheath, or from a thickening or nodule in the tendon. The condition may occur in any finger or a couple fingers at the same time. The cause may be overuse while doing the same activity over and over again with your hands.  Tendons are the tough cords that connect the muscles to bones. Muscles and tendons are part of the system which allows your body to move. When muscles contract in the forearm on the palm side, they pull the tendons toward the elbow and cause the fingers and thumb to bend (flex) toward the palm. These are the flexor tendons. The tendons slide through a slippery smooth membrane (synovium) which is called the tendon sheath. The sheaths have areas of tough fibrous tissues surrounding them which hold the tendons close to the bone. These are called pulleys because they work like a pulley. The first pulley is in the palm of the hand near the crease which runs across your palm. If the area of the tendon thickening is near the pulley, the tendon cannot slide smoothly through the pulley and this causes the trigger finger. The finger may lock with the finger curled or suddenly straighten out with a snap. This is more common in patients with rheumatoid arthritis and diabetes. Left untreated, the condition may get worse to the point where the finger becomes locked in flexion, like making a fist, or less commonly locked with the finger  straightened out. DIAGNOSIS  Your caregiver will easily make this diagnosis on examination. TREATMENT   Splinting for 6 to 8 weeks of time may be helpful. Use the splints as your caregiver suggests.  Heat used for twenty minutes at least four times a day followed by ice packs for twenty minutes unless directed otherwise by your caregiver may be helpful. If you find either heat or cold seems to be making the problem worse, quit using them and ask your caregiver for directions.  Cortisone injections along with splinting may speed up recovery. Several injections may be required. Cortisone may give relief after one injection.  Only take over-the-counter or prescription medicines for pain, discomfort, or fever as directed by your caregiver.  Surgery is another treatment that may be used if conservative treatments using injection and splinting does not work. Surgery can be minor without incisions (a cut does not have to be made) and  can be done with a needle through the skin. No stitches are needed and most patients may return to work the same day.  Other surgical choices involve an open procedure where the surgeon opens the hand through a small incision (cut) and cuts the pulley so the tendon can again slide smoothly. Your hand will still work fine. This small operation requires stitches and the recovery will be a little longer and the incisions will need to be protected until completely healed. You may have to limit your activities for up to 6 months.  Occupational or hand therapy may be required if there is stiffness remaining in the finger. RISKS AND COMPLICATIONS Complications are uncommon but some problems that may occur are:  Recurrence of the trigger finger. This does not mean that the surgery was not well done. It simply means that you may have formed scar tissue following surgery that causes the problem to reoccur.  Infection which could ruin the results of the surgery and can result in a  finger which is frozen and can not move normally.  Nerve injury is possible which could result in permanent numbness of one or more fingers. CARE AFTER SURGERY  Elevate your hand above your heart and use ice as instructed.  Follow instructions regarding finger motion/exercise.  Keep the surgical wound dry for at least 48 hrs or longer if instructed.  Keep your follow-up appointments.  Return to work and normal activities as instructed. SEEK IMMEDIATE MEDICAL CARE IF:  Your problems are getting worse or you do not obtain relief from the treatment.

## 2013-02-04 ENCOUNTER — Encounter: Payer: Self-pay | Admitting: Orthopedic Surgery

## 2013-02-04 ENCOUNTER — Telehealth: Payer: Self-pay | Admitting: Orthopedic Surgery

## 2013-02-04 ENCOUNTER — Ambulatory Visit (HOSPITAL_COMMUNITY)
Admission: RE | Admit: 2013-02-04 | Discharge: 2013-02-04 | Disposition: A | Discharge status: Home / Self Care | Payer: 59 | Source: Ambulatory Visit | Attending: Family Medicine | Admitting: Family Medicine

## 2013-02-04 DIAGNOSIS — M6281 Muscle weakness (generalized): Secondary | ICD-10-CM | POA: Insufficient documentation

## 2013-02-04 DIAGNOSIS — IMO0001 Reserved for inherently not codable concepts without codable children: Secondary | ICD-10-CM | POA: Insufficient documentation

## 2013-02-04 DIAGNOSIS — M545 Low back pain, unspecified: Secondary | ICD-10-CM | POA: Insufficient documentation

## 2013-02-04 DIAGNOSIS — I10 Essential (primary) hypertension: Secondary | ICD-10-CM | POA: Insufficient documentation

## 2013-02-04 NOTE — Telephone Encounter (Signed)
Corrected note entered 02/04/13,regarding Date of out-patient surgery:  Surgery is scheduled for 02/06/13, [facility] as otherwise noted.

## 2013-02-04 NOTE — Evaluation (Signed)
Physical Therapy Evaluation  Patient Details  Name: Jamie Harding MRN: [id number] Date of Birth: [birth year] eval and self care Today's Date: 02/04/2013 Time: 1515-1600 PT Time Calculation (min): 45 min              Visit#: 1 of 8  Re-eval: 03/06/13 Assessment Diagnosis: lumbar pain Prior Therapy: none  Authorization: [employer]    Past Medical History:  Past Medical History  Diagnosis Date  . DM (diabetes mellitus)   . HTN (hypertension)   . Obese   . Depression   . Anxiety   . HLD (hyperlipidemia)   . Hiatal hernia   . Asthma   . GERD (gastroesophageal reflux disease)   . Fatty liver   . Gastritis   . Pancreatitis   . Tibialis tendinitis   . Chest pain     Nuclear  February, 2012,,  breast attenuation, no definite scar or ischemia  . Gastroparesis     NEGATIVE GES 2010  . Rheumatoid arthritis   . Stroke 2008    no deficits  . Neuropathy   . Ejection fraction     EF 60%, echo, February, 2012, mild LVH  . Family history of anesthesia complication     post anesthesia N/V   Past Surgical History:  Past Surgical History  Procedure Laterality Date  . Cesarean section    . Cholecystectomy    . Ankle surgery      remove extra bone-left  . Finger surgery      left little finger  . Lithotripsy    . Dilation and curettage of uterus      multiple  . Laser ablation of the cervix    . Carpal tunnel release      right side  . Tubal ligation    . Gastric emptying  07/27/2009    The amount of activity in the stomach at 120 minutes was 13% which is in the normal range  . Esophagogastroduodenoscopy  07/29/2009    Mild gastritis, benign path  . Colonoscopy with propofol  09/16/2012    Procedure: COLONOSCOPY WITH PROPOFOL;  Surgeon: West Bali, MD;  Location: [facility];  Service: Endoscopy;  Laterality: N/A;  in cecum at 0812, end at 0831 ; total withdrawal time 19 minutes  . Esophagogastroduodenoscopy (egd) with propofol  09/16/2012    Procedure: ESOPHAGOGASTRODUODENOSCOPY  (EGD) WITH PROPOFOL;  Surgeon: West Bali, MD;  Location: [facility];  Service: Endoscopy;  Laterality: N/A;  start at [phone]  . Savory dilation  09/16/2012    Procedure: SAVORY DILATION;  Surgeon: West Bali, MD;  Location: [facility];  Service: Endoscopy;  Laterality: N/A;  16 fr dilation  . Polypectomy  09/16/2012    Procedure: POLYPECTOMY;  Surgeon: West Bali, MD;  Location: [facility];  Service: Endoscopy;  Laterality: N/A;    Subjective Symptoms/Limitations Symptoms: Ms. Gassen states that she has been having back pain since Feb 28th.  She states that there was no injury or trauma. the pain became progressive; she went to the MD who gave her pain meds and mm relaxors.  When she takes them she gets about 50% relief.  She states that the pain is more on her left side; she denies radicular sx.  She is currently doing back extension and pelvic tilts as well as knee to chest exercises which she has been doing three times a day.  She is now being refered to therapy to reduce her pain How long can you sit  comfortably?: Sitting increases pain after a while especially when she is driving in a car,.  Pain after 15-20 minutes. How long can you stand comfortably?: Able to stand for 5-10 minutes and then her pain begins to increases. How long can you walk comfortably?: Walking is better than standing.  Pt states she is walking for about 20 minutes. Special Tests: Pt states that mopping, sweeping and laundry increases her pain Patient Stated Goals: painfree. Pain Assessment Currently in Pain?: No/denies (pt has taken pain meds.  Worst pain has been a 10/10.) Pain Location: Back Pain Orientation: Left;Lower Pain Type: Acute pain Pain Onset: 1 to 4 weeks ago Pain Relieving Factors: meds.    Balance Screening Balance Screen Has the patient fallen in the past 6 months: No Has the patient had a decrease in activity level because of a fear of falling? : No Is the patient reluctant to leave their home  because of a fear of falling? : No   Assessment RLE Strength Right Hip Flexion: 5/5 Right Hip Extension: 5/5 Right Hip ABduction: 5/5 Right Hip ADduction: 5/5 Right Knee Flexion: 5/5 Right Knee Extension: 5/5 Right Ankle Dorsiflexion: 5/5 LLE Strength Left Hip Flexion: 4/5 Left Hip Extension: 3+/5 Left Hip ABduction: 4/5 Left Hip ADduction: 3+/5 Left Knee Flexion: 3+/5 Left Knee Extension: 4/5 Left Ankle Dorsiflexion: 3/5 Lumbar AROM Lumbar Flexion: wfl pain upon return. Lumbar Extension: wnl no change to pain Lumbar - Right Side Bend: wnl reps increased pain. Lumbar - Left Side Bend: wnl reps increase pain. Lumbar - Right Rotation: wnl Lumbar - Left Rotation: wnl  Exercise/Treatments     Stretches Active Hamstring Stretch: 3 reps;30 seconds Prone on Elbows Stretch: 1 rep;60 seconds Press Ups: 5 reps   Supine Ab Set: 5 reps  Physical Therapy Assessment and Plan PT Assessment and Plan Clinical Impression Statement: Pt with acute increased back pain and decreased leg strength who has signs and symptoms of posterior derangement who will benefit from skilled PT to decrease symptom. Pt will benefit from skilled therapeutic intervention in order to improve on the following deficits: Pain;Decreased strength;Decreased activity tolerance Rehab Potential: Good PT Frequency: Min 2X/week PT Duration: 4 weeks PT Treatment/Interventions: Modalities;Manual techniques;Therapeutic exercise PT Plan: begin traction next treatment as well as bridging, bent knee lift, isometric hip flextion and clam;  3rd treatment begin functional squat relating to functional activity such as getting in crisper, SLR, and Tband for posture; progress to prone and lunging activities    Goals Home Exercise Program Pt will Perform Home Exercise Program: Independently PT Short Term Goals Time to Complete Short Term Goals: 2 weeks PT Short Term Goal 1: Pt pain to be no greater than a 5/10 PT Short Term  Goal 2: Pt to be able to verbalize and demonstrate the importance of proper body mechanics while doing funcitonal tasks. PT Short Term Goal 3: Pt to be able to ride in a car for an hour without increase pain PT Short Term Goal 4: Pt to be able to stand for 15 min. to make a small meal without increased pain. PT Long Term Goals Time to Complete Long Term Goals: 4 weeks PT Long Term Goal 1: I in advance HEP PT Long Term Goal 2: Pain no greater than a 2/10  Long Term Goal 3: Pt to be able to sit for two hours without pain to be able to enjoy a movie Long Term Goal 4: Pt to be able to stand for 30 minutes to make a meal  without increased Pain. PT Long Term Goal 5: Pt strength to be wnl to allow the above to occur.  Problem List Patient Active Problem List  Diagnosis  . DM  . HYPERLIPIDEMIA  . ANXIETY  . DEPRESSION  . HYPERTENSION  . ASTHMA, UNSPECIFIED  . GASTROPARESIS  . HIATAL HERNIA  . FATTY LIVER DISEASE  . TIBIALIS TENDINITIS  . TRIGGER FINGER, LEFT MIDDLE  . TRIGGER FINGER  . DYSPHAGIA UNSPECIFIED  . PANCREATITIS, HX OF  . GASTRITIS, HX OF  . Acquired trigger finger  . Plantar fasciitis  . Dyspepsia  . Encounter for screening colonoscopy  . Chest pain  . Ejection fraction  . Greater trochanteric bursitis  . Sinus tarsi syndrome    General Behavior During Session: [facility] for tasks performed Cognition: [facility] for tasks performed PT Plan of Care PT Home Exercise Plan: given PT Patient Instructions: Pt instructed to watch body mechanics when doing actrivities such as brushing teeth, getting groceries out of the trunk, picking up trash cans...  GP    Geonna Lockyer,CINDY 02/04/2013, 4:21 PM  Physician Documentation Your signature is required to indicate approval of the treatment plan as stated above.  Please sign and either send electronically or make a copy of this report for your files and return this physician signed original.   Please mark one 1.__approve of plan  2.  ___approve of plan with the following conditions.   ______________________________                                                          _____________________ Physician Signature                                                                                                             Date

## 2013-02-04 NOTE — Telephone Encounter (Signed)
Contacted insurer, [employer] ph [phone], regarding out-patient surgery scheduled 02/20/13 at [facility]. CPT code and ICD-9 codes as follows:  1) 64721, 354.0                    2) 09811, 727.03  Per Levonne Lapping, no pre-authorzation is required for these procedures, Reference # [id number].

## 2013-02-05 NOTE — H&P (Signed)
Patient ID: Jamie Harding, female DOB: 21-Feb-1956, 57 y.o. MRN: [id number]    Chief Complaint    Patient presents with    .  Hip Pain      Right hip pain, no injury.    .  Ankle Pain      Left ankle pain, no injury.  Pain and paresthesias in the left hand with triggering of the long finger      History the patient is 57 years old she is currently under treatment for lower back pain which appears to be mechanical and she is to start therapy soon. She comes in today for consultation regarding her left hand right hip and left ankle. I've been seeing her and following her for trigger finger in the past. She continues to complain of that in the left long finger.   She also complains of pain in her left ankle after left ankle surgery by Dr. Pricilla Holm for which she says a bone was removed. She has lateral pain and swelling. The ankle seems to hurt more when she is ambulating and she reports swelling along the lateral inferior malleolar area laterally.   The right hip is also bothering her over the greater trochanter she has some back pain but it appears to be unrelated. The pain is sharp throbbing stabbing and burning she rates it at 10 just some numbness and tingling as well as locking and catching related to the area as well. She does not have groin or anterior thigh pain.   She also knows she can't put her socks on on the right leg she can on the left   Medications her Norco and gabapentin   She's worn a carpal tunnel splint in the past.   Her review of systems is positive for fatigue and blurred vision, chest pain and shortness of breath, heartburn and nausea, denies genitourinary complaints   Complains of joint pain and swelling stiffness and muscle pain poor healing numbness tingling in the left hand anxiety and depression and seasonal allergies denies easy bleeding or bruising or excessive thirst.   Past Surgical History  Procedure Laterality Date  . Cesarean section    . Cholecystectomy     . Ankle surgery      remove extra bone-left  . Finger surgery      left little finger  . Lithotripsy    . Dilation and curettage of uterus      multiple  . Laser ablation of the cervix    . Carpal tunnel release      right side  . Tubal ligation    . Gastric emptying  07/27/2009    The amount of activity in the stomach at 120 minutes was 13% which is in the normal range  . Esophagogastroduodenoscopy  07/29/2009    Mild gastritis, benign path  . Colonoscopy with propofol  09/16/2012    Procedure: COLONOSCOPY WITH PROPOFOL;  Surgeon: West Bali, MD;  Location: [facility];  Service: Endoscopy;  Laterality: N/A;  in cecum at 0812, end at 0831 ; total withdrawal time 19 minutes  . Esophagogastroduodenoscopy (egd) with propofol  09/16/2012    Procedure: ESOPHAGOGASTRODUODENOSCOPY (EGD) WITH PROPOFOL;  Surgeon: West Bali, MD;  Location: [facility];  Service: Endoscopy;  Laterality: N/A;  start at [id number]  . Savory dilation  09/16/2012    Procedure: SAVORY DILATION;  Surgeon: West Bali, MD;  Location: [facility];  Service: Endoscopy;  Laterality: N/A;  16 fr dilation  . Polypectomy  09/16/2012    Procedure: POLYPECTOMY;  Surgeon: West Bali, MD;  Location: [facility];  Service: Endoscopy;  Laterality: N/A;   Family History  Problem Relation Age of Onset  . Breast cancer Mother   . Diabetes Father   . Coronary artery disease    . Arthritis    . Asthma    . Colon cancer Neg Hx   . Diabetes Sister    History  Substance Use Topics  . Smoking status: Former Smoker -- 0.50 packs/day for 3 years    Types: Cigarettes    Quit date: 09/11/1987  . Smokeless tobacco: Not on file  . Alcohol Use: No      Past Medical History    Diagnosis  Date    .  DM (diabetes mellitus)     .  HTN (hypertension)     .  Obese     .  Depression     .  Anxiety     .  HLD (hyperlipidemia)     .  Hiatal hernia     .  Asthma     .  GERD (gastroesophageal reflux disease)     .  Fatty liver     .   Gastritis     .  Pancreatitis     .  Tibialis tendinitis     .  Chest pain       Nuclear February, 2012,, breast attenuation, no definite scar or ischemia    .  Gastroparesis       NEGATIVE GES 2010    .  Rheumatoid arthritis     .  Stroke  2008      no deficits    .  Neuropathy     .  Ejection fraction       EF 60%, echo, February, 2012, mild LVH     Pulse 80  Ht 5\' 2"  (1.575 m)  Wt 235 lb (106.595 kg)  BMI 42.97 kg/m2   She does have a large BMI is obesity as a factor. The diabetes is a factor as well.  She is oriented x3 her mood and affect are normal she seems to have a reasonable ambulation pattern.   The left hip no tenderness full range of motion no swelling joint is stable strength is normal skin is intact normal distal pulses and temperature no edema or swelling no lymphadenopathy in the groin sensations intact straight leg and pathologic reflexes are normal balance is normal as well   The right lower extremity is tender over the greater trochanter painful external rotation no pain on internal rotation. Stability normal. Muscle tone normal. Skin normal. Sensation normal. Straight leg raise is negative. Sensation coordination and balance normal pulse and temperature normal   Right upper extremity no contracture subluxation atrophy tremor or tenderness  Left upper extremity tenderness over the A1 pulley of the left long finger with tenderness over the carpal tunnel decreased sensation in the small ring and long finger. Wrist range of motion and stability are normal grip strength is slightly diminished skin no rash. Tender to palpation over the carpal tunnel.  X-rays were done at the hospital they included a hip and ankle and they are essentially normal except for some really nonspecific changes which do not warrant any further intervention.   She does have sinus tarsi syndrome   Addendum left ankle exam she has bilateral flat feet left worse than right with a scar posterior to  the malleolus and in  line with the Achilles tendon a curved and then comes distally. The scars nontender negative Tinel's   There is pain in the sinus Tarsi and swelling which may represent ganglion cyst and fat pad. Ankle range of motion is otherwise normal and stability is normal   Sinus tarsi syndrome, left   Greater trochanteric bursitis, right   Acquired trigger finger   Carpal tunnel syndrome of left wrist   She would like to proceed with trigger release left long finger and carpal tunnel release left wrist

## 2013-02-06 ENCOUNTER — Encounter (HOSPITAL_COMMUNITY)
Admission: RE | Disposition: A | Discharge status: Home / Self Care | Payer: Self-pay | Source: Ambulatory Visit | Attending: Orthopedic Surgery

## 2013-02-06 ENCOUNTER — Encounter (HOSPITAL_COMMUNITY): Payer: Self-pay | Admitting: Anesthesiology

## 2013-02-06 ENCOUNTER — Ambulatory Visit (HOSPITAL_COMMUNITY)
Admission: RE | Admit: 2013-02-06 | Discharge: 2013-02-06 | Disposition: A | Discharge status: Home / Self Care | Payer: 59 | Source: Ambulatory Visit | Attending: Orthopedic Surgery | Admitting: Orthopedic Surgery

## 2013-02-06 ENCOUNTER — Ambulatory Visit (HOSPITAL_COMMUNITY): Payer: 59 | Admitting: Anesthesiology

## 2013-02-06 DIAGNOSIS — G5600 Carpal tunnel syndrome, unspecified upper limb: Secondary | ICD-10-CM | POA: Insufficient documentation

## 2013-02-06 DIAGNOSIS — M6530 Trigger finger, unspecified finger: Secondary | ICD-10-CM

## 2013-02-06 DIAGNOSIS — E119 Type 2 diabetes mellitus without complications: Secondary | ICD-10-CM | POA: Insufficient documentation

## 2013-02-06 DIAGNOSIS — G5602 Carpal tunnel syndrome, left upper limb: Secondary | ICD-10-CM

## 2013-02-06 DIAGNOSIS — Z01812 Encounter for preprocedural laboratory examination: Secondary | ICD-10-CM | POA: Insufficient documentation

## 2013-02-06 HISTORY — PX: CARPAL TUNNEL RELEASE: SHX101

## 2013-02-06 HISTORY — PX: TRIGGER FINGER RELEASE: SHX641

## 2013-02-06 LAB — GLUCOSE, CAPILLARY: Glucose-Capillary: 90 mg/dL (ref 70–99)

## 2013-02-06 LAB — POCT I-STAT 4, (NA,K, GLUC, HGB,HCT)
HCT: 43 % (ref 36.0–46.0)
Hemoglobin: 14.6 g/dL (ref 12.0–15.0)
Potassium: 4.3 mEq/L (ref 3.5–5.1)
Sodium: 142 mEq/L (ref 135–145)

## 2013-02-06 SURGERY — CARPAL TUNNEL RELEASE
Anesthesia: Regional | Site: Wrist | Laterality: Left | Wound class: Clean

## 2013-02-06 MED ORDER — ONDANSETRON HCL 4 MG/2ML IJ SOLN
INTRAMUSCULAR | Status: AC
  Filled 2013-02-06: qty 2

## 2013-02-06 MED ORDER — FENTANYL CITRATE 0.05 MG/ML IJ SOLN
INTRAMUSCULAR | Status: AC
  Filled 2013-02-06: qty 2

## 2013-02-06 MED ORDER — CEFAZOLIN SODIUM-DEXTROSE 2-3 GM-% IV SOLR
2.0000 g | INTRAVENOUS | Status: AC
  Administered 2013-02-06: 2 g via INTRAVENOUS

## 2013-02-06 MED ORDER — MIDAZOLAM HCL 2 MG/2ML IJ SOLN
INTRAMUSCULAR | Status: AC
  Filled 2013-02-06: qty 2

## 2013-02-06 MED ORDER — BUPIVACAINE HCL (PF) 0.5 % IJ SOLN
INTRAMUSCULAR | Status: DC | PRN
  Administered 2013-02-06: 10 mL

## 2013-02-06 MED ORDER — LACTATED RINGERS IV SOLN
INTRAVENOUS | Status: DC
  Administered 2013-02-06: 11:00:00 via INTRAVENOUS

## 2013-02-06 MED ORDER — FENTANYL CITRATE 0.05 MG/ML IJ SOLN
25.0000 ug | INTRAMUSCULAR | Status: DC | PRN
  Administered 2013-02-06: 25 ug via INTRAVENOUS

## 2013-02-06 MED ORDER — ONDANSETRON HCL 4 MG/2ML IJ SOLN
4.0000 mg | Freq: Once | INTRAMUSCULAR | Status: AC
  Administered 2013-02-06: 4 mg via INTRAVENOUS

## 2013-02-06 MED ORDER — MIDAZOLAM HCL 2 MG/2ML IJ SOLN
1.0000 mg | INTRAMUSCULAR | Status: DC | PRN
  Administered 2013-02-06: 2 mg via INTRAVENOUS

## 2013-02-06 MED ORDER — PROPOFOL INFUSION 10 MG/ML OPTIME
INTRAVENOUS | Status: DC | PRN
  Administered 2013-02-06: 35 ug/kg/min via INTRAVENOUS

## 2013-02-06 MED ORDER — FENTANYL CITRATE 0.05 MG/ML IJ SOLN
INTRAMUSCULAR | Status: DC | PRN
  Administered 2013-02-06 (×2): 25 ug via INTRAVENOUS

## 2013-02-06 MED ORDER — SODIUM CHLORIDE 0.9 % IJ SOLN
INTRAMUSCULAR | Status: AC
  Filled 2013-02-06: qty 10

## 2013-02-06 MED ORDER — LIDOCAINE HCL (PF) 0.5 % IJ SOLN
INTRAMUSCULAR | Status: DC | PRN
  Administered 2013-02-06: 250 mg via INTRAVENOUS

## 2013-02-06 MED ORDER — HYDROCODONE-ACETAMINOPHEN 10-325 MG PO TABS: 1.0000 | ORAL_TABLET | ORAL | Status: DC | PRN

## 2013-02-06 MED ORDER — FENTANYL CITRATE 0.05 MG/ML IJ SOLN: 25.0000 ug | INTRAMUSCULAR | Status: DC | PRN

## 2013-02-06 MED ORDER — LIDOCAINE HCL (PF) 0.5 % IJ SOLN
INTRAMUSCULAR | Status: AC
  Filled 2013-02-06: qty 50

## 2013-02-06 MED ORDER — SODIUM CHLORIDE 0.9 % IR SOLN
Status: DC | PRN
  Administered 2013-02-06: 1000 mL

## 2013-02-06 MED ORDER — CEFAZOLIN SODIUM-DEXTROSE 2-3 GM-% IV SOLR
INTRAVENOUS | Status: AC
  Filled 2013-02-06: qty 50

## 2013-02-06 MED ORDER — BUPIVACAINE HCL (PF) 0.5 % IJ SOLN
INTRAMUSCULAR | Status: AC
  Filled 2013-02-06: qty 30

## 2013-02-06 MED ORDER — CHLORHEXIDINE GLUCONATE 4 % EX LIQD: 60.0000 mL | Freq: Once | CUTANEOUS | Status: DC

## 2013-02-06 MED ORDER — LIDOCAINE HCL (PF) 1 % IJ SOLN
INTRAMUSCULAR | Status: AC
  Filled 2013-02-06: qty 5

## 2013-02-06 MED ORDER — ONDANSETRON HCL 4 MG/2ML IJ SOLN: 4.0000 mg | Freq: Once | INTRAMUSCULAR | Status: DC | PRN

## 2013-02-06 MED ORDER — PROPOFOL 10 MG/ML IV EMUL
INTRAVENOUS | Status: AC
  Filled 2013-02-06: qty 20

## 2013-02-06 SURGICAL SUPPLY — 37 items
BAG HAMPER (MISCELLANEOUS) ×3 IMPLANT
BANDAGE ELASTIC 3 VELCRO NS (GAUZE/BANDAGES/DRESSINGS) ×3 IMPLANT
BANDAGE ESMARK 4X12 BL STRL LF (DISPOSABLE) ×2 IMPLANT
BANDAGE GAUZE ELAST BULKY 4 IN (GAUZE/BANDAGES/DRESSINGS) ×1 IMPLANT
BLADE MINI RND TIP GREEN BEAV (BLADE) ×3 IMPLANT
BNDG CMPR 12X4 ELC STRL LF (DISPOSABLE) ×2
BNDG COHESIVE 4X5 TAN NS LF (GAUZE/BANDAGES/DRESSINGS) ×3 IMPLANT
BNDG ESMARK 4X12 BLUE STRL LF (DISPOSABLE) ×3
CHLORAPREP W/TINT 26ML (MISCELLANEOUS) ×3 IMPLANT
CLOTH BEACON ORANGE TIMEOUT ST (SAFETY) ×3 IMPLANT
COVER LIGHT HANDLE STERIS (MISCELLANEOUS) ×6 IMPLANT
CUFF TOURNIQUET SINGLE 18IN (TOURNIQUET CUFF) ×3 IMPLANT
DECANTER SPIKE VIAL GLASS SM (MISCELLANEOUS) ×3 IMPLANT
DRSG XEROFORM 1X8 (GAUZE/BANDAGES/DRESSINGS) ×1 IMPLANT
ELECT NDL TIP 2.8 STRL (NEEDLE) ×2 IMPLANT
ELECT NEEDLE TIP 2.8 STRL (NEEDLE) ×3 IMPLANT
ELECT REM PT RETURN 9FT ADLT (ELECTROSURGICAL) ×3
ELECTRODE REM PT RTRN 9FT ADLT (ELECTROSURGICAL) ×2 IMPLANT
GLOVE ECLIPSE 7.0 STRL STRAW (GLOVE) ×2 IMPLANT
GLOVE INDICATOR 7.0 STRL GRN (GLOVE) ×2 IMPLANT
GLOVE INDICATOR 7.5 STRL GRN (GLOVE) ×1 IMPLANT
GLOVE SKINSENSE NS SZ8.0 LF (GLOVE) ×1
GLOVE SKINSENSE STRL SZ8.0 LF (GLOVE) ×2 IMPLANT
GLOVE SS N UNI LF 8.5 STRL (GLOVE) ×3 IMPLANT
GOWN STRL REIN XL XLG (GOWN DISPOSABLE) ×9 IMPLANT
HAND ALUMI XLG (SOFTGOODS) ×3 IMPLANT
KIT ROOM TURNOVER APOR (KITS) ×3 IMPLANT
MANIFOLD NEPTUNE II (INSTRUMENTS) ×3 IMPLANT
NDL HYPO 21X1.5 SAFETY (NEEDLE) ×2 IMPLANT
NEEDLE HYPO 21X1.5 SAFETY (NEEDLE) ×3 IMPLANT
NS IRRIG 1000ML POUR BTL (IV SOLUTION) ×3 IMPLANT
PACK BASIC LIMB (CUSTOM PROCEDURE TRAY) ×3 IMPLANT
PAD ARMBOARD 7.5X6 YLW CONV (MISCELLANEOUS) ×3 IMPLANT
SET BASIN LINEN APH (SET/KITS/TRAYS/PACK) ×3 IMPLANT
SPONGE GAUZE 4X4 12PLY (GAUZE/BANDAGES/DRESSINGS) ×1 IMPLANT
SUT ETHILON 3 0 FSL (SUTURE) ×3 IMPLANT
SYR CONTROL 10ML LL (SYRINGE) ×3 IMPLANT

## 2013-02-06 NOTE — Anesthesia Postprocedure Evaluation (Signed)
  Anesthesia Post-op Note  Patient: Jamie Harding  Procedure(s) Performed: Procedure(s) with comments: CARPAL TUNNEL RELEASE  (Left) - procedure end 1135 RELEASE TRIGGER FINGER LEFT LONG FINGER/A-1 PULLEY (Left) - procedure began 1136  Patient Location: PACU  Anesthesia Type:MAC and Bier block  Level of Consciousness: awake, alert  and oriented  Airway and Oxygen Therapy: Patient Spontanous Breathing  Post-op Pain: none  Post-op Assessment: Post-op Vital signs reviewed, Patient's Cardiovascular Status Stable, Respiratory Function Stable, Patent Airway and No signs of Nausea or vomiting  Post-op Vital Signs: Reviewed temp 97.8  Complications: No apparent anesthesia complications

## 2013-02-06 NOTE — Anesthesia Preprocedure Evaluation (Signed)
Anesthesia Evaluation  Patient identified by MRN, date of birth, ID band Patient awake    Reviewed: Allergy & Precautions, H&P , NPO status , Patient's Chart, lab work & pertinent test results  Airway Mallampati: II      Dental  (+) Partial Lower and Teeth Intact   Pulmonary asthma ,  breath sounds clear to auscultation        Cardiovascular hypertension, Pt. on medications Rhythm:Regular     Neuro/Psych PSYCHIATRIC DISORDERS Anxiety Depression  Neuromuscular disease CVA, No Residual Symptoms    GI/Hepatic hiatal hernia, GERD-  Medicated and Controlled,  Endo/Other  diabetes, Well Controlled, Type 2, Insulin Dependent  Renal/GU      Musculoskeletal  (+) Arthritis -,   Abdominal   Peds  Hematology   Anesthesia Other Findings   Reproductive/Obstetrics                           Anesthesia Physical Anesthesia Plan  ASA: III  Anesthesia Plan: Bier Block   Post-op Pain Management:    Induction: Intravenous  Airway Management Planned: Nasal Cannula  Additional Equipment:   Intra-op Plan:   Post-operative Plan:   Informed Consent: I have reviewed the patients History and Physical, chart, labs and discussed the procedure including the risks, benefits and alternatives for the proposed anesthesia with the patient or authorized representative who has indicated his/her understanding and acceptance.     Plan Discussed with:   Anesthesia Plan Comments:         Anesthesia Quick Evaluation

## 2013-02-06 NOTE — Anesthesia Procedure Notes (Signed)
Anesthesia Regional Block:  Bier block ([facility])  Pre-Anesthetic Checklist: ,, timeout performed, Correct Patient, Correct Site, Correct Laterality, Correct Procedure,, site marked, surgical consent,, at surgeon's request  Laterality: Left     Needles:  Injection technique: Single-shot  Needle Type: Other      Needle Gauge: 22 and 22 G    Additional Needles: Bier block ([facility])  Nerve Stimulator or Paresthesia:   Additional Responses:  Pulse checked post tourniquet inflation. IV NSL discontinued post injection. Narrative:  Start time: 02/06/2013 11:15 AM  Performed by: With CRNAs  Resident/CRNA: Mabeline Caras, CRNA  Bier block ([facility])

## 2013-02-06 NOTE — Op Note (Signed)
Procedure #1  Operative note RIGHT carpal tunnel release date of surgery  21st 2014 Preop diagnosis carpal tunnel syndrome RIGHT wrist Postop diagnosis carpal tunnel syndrome RIGHT wrist Procedure open RIGHT carpal tunnel release Surgeon Romeo Apple Anesthetic regional Bier block Operative findings stenotic carpal tunnel Details of procedure: The patient was identified in the preop holding area and the surgical site was marked encounter signed.  The history and physical update was completed.  Antibiotics were ordered and the patient was given antibiotics and taken to surgery.  In the surgical suite a Bier block was done and then the arm was prepped and draped in sterile technique.  At this point the timeout procedure was initiated and completed.  The incision was made over the carpal tunnel and extended to the palmar fascia.  The palmar fascia was divided bluntly and dissection was carried out to the distal aspect of the transverse carpal ligament.  Blunt dissection was carried out beneath the ligament and sharp dissection was used to release the ligament.  The contents of the carpal tunnel were inspected and found to be free of any other pathology.  Irrigation was performed.  Closure was with interrupted 3-0 nylon suture.  10 cc of plain Sensorcaine was injected around the incision edges.  A sterile bandage was applied.    Procedure #2   Preoperative diagnosis trigger finger left long finger postop diagnosis same procedure release A1 pulley left long finger  A transverse incision was made over the proximal aspect of the A1 pulley the subcutaneous tissue was divided blunt dissection was carried around around the tendon to protect the neurovascular structures ablation was passed beneath the A1 pulley the A1 pulley was released and visualized in its distal age. The finger was flexed and extended to look for any defects or abnormalities of the tendon other than some mild degeneration there were no  ganglions or tears in the tendon.  Hemostasis was obtained with electrocautery  Preoperative sutures in interrupted fashion were used to close the skin  The tourniquet was released the color of the fingers was normal and the capillary refill was normal.  The patient is transferred to Recovery area and will be discharged home.

## 2013-02-06 NOTE — Progress Notes (Signed)
Needs to void. Refuses bedpan. Wants to go home. To postop. To BR. Tolerated well.

## 2013-02-06 NOTE — Interval H&P Note (Signed)
History and Physical Interval Note:  02/06/2013 10:51 AM  Jamie Harding  has presented today for surgery, with the diagnosis of Left carpal tunnel syndrome and left long trigger finger  The various methods of treatment have been discussed with the patient and family. After consideration of risks, benefits and other options for treatment, the patient has consented to  Procedure(s): CARPAL TUNNEL RELEASE (Left) RELEASE TRIGGER FINGER/A-1 PULLEY (Left) LONG FINGER as a surgical intervention .  The patient's history has been reviewed, patient examined, no change in status, stable for surgery.  I have reviewed the patient's chart and labs.  Questions were answered to the patient's satisfaction.     Fuller Canada

## 2013-02-06 NOTE — Brief Op Note (Signed)
02/06/2013  11:59 AM  PATIENT:  Jamie Harding  57 y.o. female  PRE-OPERATIVE DIAGNOSIS:  Left carpal tunnel syndrome and left long trigger finger  POST-OPERATIVE DIAGNOSIS:  * No post-op diagnosis entered *  PROCEDURE:  Procedure(s) with comments: CARPAL TUNNEL RELEASE  (Left) - procedure end 1135 RELEASE TRIGGER FINGER LEFT LONG FINGER/A-1 PULLEY (Left) - procedure began 1136  SURGEON:  Surgeon(s) and Role:    * Vickki Hearing, MD - Primary  PHYSICIAN ASSISTANT:   ASSISTANTS: catherine page    ANESTHESIA:   regional  EBL:  Total I/O In: 600 [I.V.:600] Out: 0   BLOOD ADMINISTERED:none  DRAINS: none   LOCAL MEDICATIONS USED:  MARCAINE 0.5% plain    and Amount: 10 ml  SPECIMEN:  No Specimen  DISPOSITION OF SPECIMEN:  N/A  COUNTS:  YES  TOURNIQUET:   Total Tourniquet Time Documented: Upper Arm (Left) - 45 minutes Total: Upper Arm (Left) - 45 minutes   DICTATION: .Reubin Milan Dictation  PLAN OF CARE: Discharge to home after PACU  PATIENT DISPOSITION:  PACU - hemodynamically stable.   Delay start of Pharmacological VTE agent (>24hrs) due to surgical blood loss or risk of bleeding: not applicable

## 2013-02-06 NOTE — Transfer of Care (Signed)
Immediate Anesthesia Transfer of Care Note  Patient: Jamie Harding  Procedure(s) Performed: Procedure(s) with comments: CARPAL TUNNEL RELEASE  (Left) - procedure end 1135 RELEASE TRIGGER FINGER LEFT LONG FINGER/A-1 PULLEY (Left) - procedure began 1136  Patient Location: PACU  Anesthesia Type:MAC and Bier block  Level of Consciousness: awake, alert  and oriented  Airway & Oxygen Therapy: Patient Spontanous Breathing  Post-op Assessment: Report given to PACU RN  Post vital signs: Reviewed and stable  Complications: No apparent anesthesia complications

## 2013-02-09 ENCOUNTER — Ambulatory Visit (INDEPENDENT_AMBULATORY_CARE_PROVIDER_SITE_OTHER): Payer: 59 | Admitting: Orthopedic Surgery

## 2013-02-09 ENCOUNTER — Encounter (HOSPITAL_COMMUNITY): Payer: Self-pay | Admitting: Orthopedic Surgery

## 2013-02-09 ENCOUNTER — Encounter: Payer: Self-pay | Admitting: Orthopedic Surgery

## 2013-02-09 VITALS — BP 140/78 | Ht 62.0 in | Wt 235.0 lb

## 2013-02-09 DIAGNOSIS — M6530 Trigger finger, unspecified finger: Secondary | ICD-10-CM

## 2013-02-09 NOTE — Patient Instructions (Addendum)
Keep incision clean and dry.

## 2013-02-09 NOTE — Progress Notes (Signed)
Patient ID: Jamie Harding, female   DOB: Apr 11, 1956, 57 y.o.   MRN: [id number] Chief Complaint  Patient presents with  . Follow-up    Post op #1, left trigger finger and left CTR. DOS 02-06-13.    History: doing well  BP 140/78  Ht 5\' 2"  (1.575 m)  Wt 235 lb (106.595 kg)  BMI 42.97 kg/m2  Incision s are clean   No numbness of the finger

## 2013-02-10 ENCOUNTER — Ambulatory Visit (HOSPITAL_COMMUNITY)
Admission: RE | Admit: 2013-02-10 | Discharge: 2013-02-10 | Disposition: A | Discharge status: Home / Self Care | Payer: 59 | Source: Ambulatory Visit | Attending: Orthopedic Surgery | Admitting: Orthopedic Surgery

## 2013-02-10 NOTE — Progress Notes (Signed)
Physical Therapy Treatment Patient Details  Name: Jamie Harding MRN: [id number] Date of Birth: April 20, 1956  Today's Date: 02/10/2013 Time: 1432-1530 (Pt. set up on traction and removed by CC, PTA) PT Time Calculation (min): 58 min Visit#: 2 of 8  Re-eval: 03/06/13 Charges:  therex 30',traction X 1  Subjective: Symptoms/Limitations Symptoms: Pt. had carpal tunnel and trigger finger surgery on Friday.  Pt reports her lumbar pain is 5/10 centrally. Pain Assessment Currently in Pain?: Yes Pain Score:   5 Pain Location: Back Pain Orientation: Mid;Lower   Exercise/Treatments Stretches Prone on Elbows Stretch: Limitations Prone on Elbows Stretch Limitations: held due to pain into Lt hand Press Ups: Limitations Press Ups Limitations: held due to Lt hand surgery Supine Ab Set: 10 reps;5 seconds Clam: 10 reps Bridge: 10 reps Isometric Hip Flexion: 10 reps;5 seconds    Modalities Modalities: Traction Traction Type of Traction: Lumbar Min (lbs): n/a Max (lbs): 100 (pt. weighs 238#) Hold Time: Static Rest Time: n/a Time: 9'  Physical Therapy Assessment and Plan PT Assessment and Plan Clinical Impression Statement: No immediate relief after traction today; reported pain remains at 5/10.  Reviewed HEP in good form but unable to perform POE or press ups due to recent CTR surgery on L hand.  Pt. able to complete new stab exercises without difficulty, however unable to use L UE for assistance with any exercises.   Pt will benefit from skilled therapeutic intervention in order to improve on the following deficits: Pain;Decreased strength;Decreased activity tolerance Rehab Potential: Good PT Frequency: Min 2X/week PT Duration: 4 weeks PT Treatment/Interventions: Modalities;Manual techniques;Therapeutic exercise PT Plan: Next treatment begin functional squat relating to functional activity such as getting in crisper, SLR, and Tband for posture (may need to modify for L UE); progress to  prone and lunging activities.     Problem List Patient Active Problem List  Diagnosis  . DM  . HYPERLIPIDEMIA  . ANXIETY  . DEPRESSION  . HYPERTENSION  . ASTHMA, UNSPECIFIED  . GASTROPARESIS  . HIATAL HERNIA  . FATTY LIVER DISEASE  . TIBIALIS TENDINITIS  . TRIGGER FINGER, LEFT MIDDLE  . TRIGGER FINGER  . DYSPHAGIA UNSPECIFIED  . PANCREATITIS, HX OF  . GASTRITIS, HX OF  . Acquired trigger finger  . Plantar fasciitis  . Dyspepsia  . Encounter for screening colonoscopy  . Chest pain  . Ejection fraction  . Greater trochanteric bursitis  . Sinus tarsi syndrome    General Behavior During Session: [facility] for tasks performed Cognition: [facility] for tasks performed   Jamie Harding, PTA/CLT 02/10/2013, 4:30 PM

## 2013-02-12 ENCOUNTER — Ambulatory Visit (HOSPITAL_COMMUNITY)
Admission: RE | Admit: 2013-02-12 | Discharge: 2013-02-12 | Disposition: A | Discharge status: Home / Self Care | Payer: 59 | Source: Ambulatory Visit | Attending: Orthopedic Surgery | Admitting: Orthopedic Surgery

## 2013-02-12 NOTE — Progress Notes (Signed)
Physical Therapy Treatment Patient Details  Name: Jamie Harding MRN: [id number] Date of Birth: 04/12/1956  Today's Date: 02/12/2013 Time: 4098-1191 PT Time Calculation (min): 46 min Visit#: 3 of 8  Re-eval: 03/06/13 Charges:  therex 24', traction 15'  Subjective: Symptoms/Limitations Symptoms: Pt. states her back was feeling much better, under 4/10 and really had no pain the day after until the wind blew the car door into her back and increased her pain.  Currently 6/10 pain. Pain Assessment Currently in Pain?: Yes Pain Score:   6 Pain Location: Back Pain Orientation: Mid;Lower   Exercise/Treatments Supine Ab Set: 10 reps Clam: 10 reps Bent Knee Raise: 10 reps Bridge: 15 reps Straight Leg Raise: 10 reps Isometric Hip Flexion: 10 reps    Modalities Modalities: Traction Traction Type of Traction: Lumbar Min (lbs): n/a Max (lbs): 110 (pt. weighs 238#) Hold Time: Static Rest Time: n/a Time: 3'  Physical Therapy Assessment and Plan PT Assessment and Plan Clinical Impression Statement: Pt. reported immediate relief from traction upon standing, stating only minimal discomfort and mostly where the door had hit her in the back.  Pain of less than 2/10.  Added SLR and BNR to increase core stability.  Pt. encouraged to continue HEP. PT Treatment/Interventions: Modalities;Manual techniques;Therapeutic exercise PT Plan: Next treatment begin functional squat relating to functional activity such as getting in crisper and Tband for posture (may need to modify for L UE); progress to prone and lunging activities.     Problem List Patient Active Problem List  Diagnosis  . DM  . HYPERLIPIDEMIA  . ANXIETY  . DEPRESSION  . HYPERTENSION  . ASTHMA, UNSPECIFIED  . GASTROPARESIS  . HIATAL HERNIA  . FATTY LIVER DISEASE  . TIBIALIS TENDINITIS  . TRIGGER FINGER, LEFT MIDDLE  . TRIGGER FINGER  . DYSPHAGIA UNSPECIFIED  . PANCREATITIS, HX OF  . GASTRITIS, HX OF  . Acquired trigger  finger  . Plantar fasciitis  . Dyspepsia  . Encounter for screening colonoscopy  . Chest pain  . Ejection fraction  . Greater trochanteric bursitis  . Sinus tarsi syndrome    General Behavior During Session: [facility] for tasks performed Cognition: [facility] for tasks performed   Lurena Nida, PTA/CLT 02/12/2013, 3:30 PM

## 2013-02-16 ENCOUNTER — Encounter: Payer: Self-pay | Admitting: Family Medicine

## 2013-02-16 ENCOUNTER — Ambulatory Visit: Payer: 59 | Admitting: Cardiology

## 2013-02-16 ENCOUNTER — Ambulatory Visit (INDEPENDENT_AMBULATORY_CARE_PROVIDER_SITE_OTHER): Payer: 59 | Admitting: Family Medicine

## 2013-02-16 VITALS — BP 138/80 | HR 80 | Ht 63.0 in | Wt 237.2 lb

## 2013-02-16 DIAGNOSIS — M79605 Pain in left leg: Secondary | ICD-10-CM

## 2013-02-16 DIAGNOSIS — M545 Low back pain, unspecified: Secondary | ICD-10-CM

## 2013-02-16 NOTE — Patient Instructions (Signed)
Get mri, we should have results within 3 to 4 days

## 2013-02-16 NOTE — Progress Notes (Signed)
MRI lumbar spine without contrast scheduled on 02/20/2013 at 9 am. Patient notified.

## 2013-02-16 NOTE — Progress Notes (Signed)
  Subjective:    Patient ID: Jamie Harding, female    DOB: Jul 03, 1956, 57 y.o.   MRN: [id number]  Back Pain This is a new problem. The current episode started more than 1 month ago. The problem occurs intermittently. The problem has been gradually improving since onset. The pain is present in the lumbar spine. The quality of the pain is described as stabbing. The pain does not radiate. The pain is at a severity of 6/10. The pain is moderate. The pain is worse during the night. The symptoms are aggravated by lying down. Stiffness is present in the morning. Associated symptoms include weakness. She has tried home exercises for the symptoms. The treatment provided significant relief.   It should be noted that there was a slight tear in the above documentation that the system will not allow me to a race. Above it states the pain does not radiate but the patient does state that the pain is in her lower back but she does have intermittent pains on the left leg at first she did not think this was radiation but the distribution of the pain on the but I am behind the left thigh is consistent with radiation. She also relates she is experience weakness in the left leg. She denies any tingling. She states she tried anti-inflammatories are really did not seem to help the physical therapy although has helped some it has not helped a lot.  Past medical history social history family history all reviewed. Patient is not capable of doing her current job which requires bending twisting lifting pushing and pulling.   Review of Systems  Musculoskeletal: Positive for back pain.  Neurological: Positive for weakness.       Objective:   Physical Exam Vital signs noted Lungs are clear no crackles heart regular no murmurs flanks nontender to percussion Abdomen soft no guarding or rebound increased back discomfort with straight leg raise on the left she also has 5 over 5 strength on the right side 3+/4 strength in the  quadriceps dorsiflexion and hamstring muscles. Reflexes are diminished. Sensory grossly normal. No edema or tenderness on exam in the calf.       Assessment & Plan:  Lumbar pain with radiation down left leg - Plan: MR Lumbar Spine Wo Contrast this patient has failed conservative therapy including rest and physical therapy the next step is progress forward with MRI may need neurosurgical referral. Await the results. She was given a work statement through the and of April and she will followup with [name] again in approximately 3-4 weeks she will continue physical therapy. In addition to this recently had surgery on her hand hopefully her surgeon we'll release her to normal duty by later in April and regards to that problem.

## 2013-02-17 ENCOUNTER — Encounter: Payer: Self-pay | Admitting: Orthopedic Surgery

## 2013-02-17 ENCOUNTER — Ambulatory Visit (HOSPITAL_COMMUNITY)
Admission: RE | Admit: 2013-02-17 | Discharge: 2013-02-17 | Disposition: A | Discharge status: Home / Self Care | Payer: 59 | Source: Ambulatory Visit | Attending: Orthopedic Surgery | Admitting: Orthopedic Surgery

## 2013-02-17 ENCOUNTER — Ambulatory Visit (INDEPENDENT_AMBULATORY_CARE_PROVIDER_SITE_OTHER): Payer: 59 | Admitting: Orthopedic Surgery

## 2013-02-17 VITALS — BP 130/72 | Ht 62.0 in | Wt 235.0 lb

## 2013-02-17 DIAGNOSIS — M545 Low back pain, unspecified: Secondary | ICD-10-CM | POA: Insufficient documentation

## 2013-02-17 DIAGNOSIS — M6530 Trigger finger, unspecified finger: Secondary | ICD-10-CM | POA: Insufficient documentation

## 2013-02-17 DIAGNOSIS — I10 Essential (primary) hypertension: Secondary | ICD-10-CM | POA: Insufficient documentation

## 2013-02-17 DIAGNOSIS — Z9889 Other specified postprocedural states: Secondary | ICD-10-CM

## 2013-02-17 DIAGNOSIS — IMO0001 Reserved for inherently not codable concepts without codable children: Secondary | ICD-10-CM | POA: Insufficient documentation

## 2013-02-17 DIAGNOSIS — M6281 Muscle weakness (generalized): Secondary | ICD-10-CM | POA: Insufficient documentation

## 2013-02-17 NOTE — Progress Notes (Signed)
Physical Therapy Treatment Patient Details  Name: Jamie Harding MRN: [id number] Date of Birth: 05-13-1956  Today's Date: 02/17/2013 Time: 9147-8295 PT Time Calculation (min): 45 min Visit#: 4 of 8  Re-eval: 03/06/13 Authorization: [facility]  Charges:  therex 23', mechanical traction X 1 unit  Subjective: Symptoms/Limitations Symptoms: Pt. states she returned to ortho today re: hand surgery and a stitch busted.  Pt. also returned to Dr. Gerda Diss (back) who is sending her for an MRI.  Pt. states he wants her to continue her therapy. Pain Assessment Currently in Pain?: Yes Pain Score:   5 Pain Orientation: Mid;Lower   Exercise/Treatments Standing Functional Squats: 10 reps Scapular Retraction: 10 reps;Theraband Theraband Level (Scapular Retraction): Level 3 (Green) Row: 10 reps;Theraband Theraband Level (Row): Level 3 (Green) Shoulder Extension: 10 reps;Theraband Theraband Level (Shoulder Extension): Level 3 (Green) Other Standing Lumbar Exercises: Bilateral UE flexion against wall 10 reps Other Standing Lumbar Exercises: Corner stretch for chest 3X30"    Modalities Modalities: Traction Traction Type of Traction: Lumbar Min (lbs): n/a Max (lbs): 115 (pt. weighs 238#) Hold Time: Static Rest Time: n/a Time: 60'  Physical Therapy Assessment and Plan PT Assessment and Plan Clinical Impression Statement: Increased traction 5# today with noted relief at end of session.  Progressed stability exercises with theraband, squats and UE flexion.  Able to perform in good form.  Will give theraband next session,. PT Treatment/Interventions: Modalities;Manual techniques;Therapeutic exercise PT Plan: Next treatment give theraband and written instructions.  Progress to prone and lunging activities.     Problem List Patient Active Problem List  Diagnosis  . DM  . HYPERLIPIDEMIA  . ANXIETY  . DEPRESSION  . HYPERTENSION  . ASTHMA, UNSPECIFIED  . GASTROPARESIS  . HIATAL HERNIA  . FATTY  LIVER DISEASE  . TIBIALIS TENDINITIS  . TRIGGER FINGER, LEFT MIDDLE  . TRIGGER FINGER  . DYSPHAGIA UNSPECIFIED  . PANCREATITIS, HX OF  . GASTRITIS, HX OF  . Acquired trigger finger  . Plantar fasciitis  . Dyspepsia  . Encounter for screening colonoscopy  . Chest pain  . Ejection fraction  . Greater trochanteric bursitis  . Sinus tarsi syndrome  . Lumbar pain with radiation down left leg  . Trigger finger, acquired  . S/P carpal tunnel release    General Behavior During Session: [facility] for tasks performed Cognition: [facility] for tasks performed   Lurena Nida, PTA/CLT 02/17/2013, 3:27 PM

## 2013-02-17 NOTE — Patient Instructions (Signed)
Hand exercises    

## 2013-02-17 NOTE — Progress Notes (Signed)
Patient ID: Jamie Harding, female   DOB: [birth year], 57 y.o.   MRN: [id number] Chief Complaint  Patient presents with  . Follow-up    Post op 1 Carpal tunnel release DOS 02/06/13    BP 130/72  Ht 5\' 2"  (1.575 m)  Wt 235 lb (106.595 kg)  BMI 42.97 kg/m2   All sutures remove   Wounds are clean   Skin seal applied to trigger release site   steristrips applied to wounds   Start hand exercises   4 wk f/u

## 2013-02-19 ENCOUNTER — Ambulatory Visit (HOSPITAL_COMMUNITY)
Admission: RE | Admit: 2013-02-19 | Discharge: 2013-02-19 | Disposition: A | Discharge status: Home / Self Care | Payer: 59 | Source: Ambulatory Visit | Attending: Orthopedic Surgery | Admitting: Orthopedic Surgery

## 2013-02-19 NOTE — Progress Notes (Signed)
Physical Therapy Treatment Patient Details  Name: Jamie Harding MRN: [id number] Date of Birth: [birth year]  Today's Date: 02/19/2013 Time: 1333-1500 (Pt. had to wait on traction) PT Time Calculation (min): 87 min  Visit#: 5 of 8  Re-eval: 03/06/13 Authorization: [facility]MR  Charges:  therex 40', traction X 1, MHP (no charge)  Subjective: Symptoms/Limitations Symptoms: Pt. states she gets her MRI tomorrow.  States overall pain reduction to 4/10 today. Pain Assessment Currently in Pain?: Yes Pain Score:   4 Pain Location: Back Pain Orientation: Mid;Lower   Exercise/Treatments Standing Functional Squats: 10 reps Scapular Retraction: 15 reps;Theraband Theraband Level (Scapular Retraction): Level 3 (Green) Row: Theraband;15 reps Theraband Level (Row): Level 3 (Green) Shoulder Extension: 15 reps;Theraband Theraband Level (Shoulder Extension): Level 3 (Green) Other Standing Lumbar Exercises: Bilateral UE flexion against wall 10 reps Other Standing Lumbar Exercises: Corner stretch for chest 3X30" Supine Ab Set: 10 reps Clam: 10 reps Bent Knee Raise: 10 reps Bridge: 15 reps Straight Leg Raise: 10 reps Isometric Hip Flexion: 10 reps Prone  Single Arm Raise: 10 reps Straight Leg Raise: 10 reps Other Prone Lumbar Exercises: rows, shoulder extension 10 reps each   Modalities MHP in prone while waiting for traction to low back X 10'  Traction Type of Traction: Lumbar Min (lbs): n/a Max (lbs): 115 (pt. weighs 238#) Hold Time: Static Rest Time: n/a Time: 84'  Physical Therapy Assessment and Plan PT Assessment and Plan Clinical Impression Statement: Added prone stab and postural exercises today without difficulty. Gave pt. written HEP and theraband for HEP and able to demonstrate all exercises correctly.  Pt went fishing yesterday and able to do all activities without increased pain or difficulty.  States she sat on a cushion on the ground and had to adjust self occasionally.  Pt.  also reports complaince with 30 minute daily walk.  States walking increases her pain more than any other activity. PT Treatment/Interventions: Modalities;Manual techniques;Therapeutic exercise PT Plan: Progress stability exercises and add lunges next visit.     Problem List Patient Active Problem List  Diagnosis  . DM  . HYPERLIPIDEMIA  . ANXIETY  . DEPRESSION  . HYPERTENSION  . ASTHMA, UNSPECIFIED  . GASTROPARESIS  . HIATAL HERNIA  . FATTY LIVER DISEASE  . TIBIALIS TENDINITIS  . TRIGGER FINGER, LEFT MIDDLE  . TRIGGER FINGER  . DYSPHAGIA UNSPECIFIED  . PANCREATITIS, HX OF  . GASTRITIS, HX OF  . Acquired trigger finger  . Plantar fasciitis  . Dyspepsia  . Encounter for screening colonoscopy  . Chest pain  . Ejection fraction  . Greater trochanteric bursitis  . Sinus tarsi syndrome  . Lumbar pain with radiation down left leg  . Trigger finger, acquired  . S/P carpal tunnel release    General Behavior During Session: [facility] for tasks performed Cognition: [facility] for tasks performed   Lurena Nida, PTA/CLT 02/19/2013, 2:50 PM

## 2013-02-20 ENCOUNTER — Ambulatory Visit (HOSPITAL_COMMUNITY)
Admission: RE | Admit: 2013-02-20 | Discharge: 2013-02-20 | Disposition: A | Discharge status: Home / Self Care | Payer: 59 | Source: Ambulatory Visit | Attending: Family Medicine | Admitting: Family Medicine

## 2013-02-20 DIAGNOSIS — M51379 Other intervertebral disc degeneration, lumbosacral region without mention of lumbar back pain or lower extremity pain: Secondary | ICD-10-CM | POA: Insufficient documentation

## 2013-02-20 DIAGNOSIS — R29898 Other symptoms and signs involving the musculoskeletal system: Secondary | ICD-10-CM | POA: Insufficient documentation

## 2013-02-20 DIAGNOSIS — M5126 Other intervertebral disc displacement, lumbar region: Secondary | ICD-10-CM | POA: Insufficient documentation

## 2013-02-20 DIAGNOSIS — M5137 Other intervertebral disc degeneration, lumbosacral region: Secondary | ICD-10-CM | POA: Insufficient documentation

## 2013-02-20 DIAGNOSIS — M79609 Pain in unspecified limb: Secondary | ICD-10-CM | POA: Insufficient documentation

## 2013-02-23 ENCOUNTER — Other Ambulatory Visit: Payer: Self-pay | Admitting: Family Medicine

## 2013-02-23 DIAGNOSIS — M5126 Other intervertebral disc displacement, lumbar region: Secondary | ICD-10-CM

## 2013-02-24 ENCOUNTER — Ambulatory Visit (HOSPITAL_COMMUNITY)
Admission: RE | Admit: 2013-02-24 | Discharge: 2013-02-24 | Disposition: A | Discharge status: Home / Self Care | Payer: 59 | Source: Ambulatory Visit | Attending: Orthopedic Surgery | Admitting: Orthopedic Surgery

## 2013-02-24 NOTE — Progress Notes (Signed)
Physical Therapy Treatment Patient Details  Name: Jamie Harding MRN: [id number] Date of Birth: 1956/03/14  Today's Date: 02/24/2013 Time: 1520-1610 PT Time Calculation (min): 50 min  Visit#: 6 of 8  Re-eval: 03/06/13 Charges: Therex x 28' Mechanical traction x 1  Authorization: [employer]MR    Subjective: Symptoms/Limitations Symptoms: Pt states that MRI showed bulging disc. Pt sx of centralization of pain since beginning therapy. Pain Assessment Currently in Pain?: Yes Pain Score:   4 Pain Location: Back Pain Orientation: Mid;Lower   Exercise/Treatments Standing Scapular Retraction: 15 reps;Theraband Theraband Level (Scapular Retraction): Level 3 (Green) Row: Theraband;15 reps Theraband Level (Row): Level 3 (Green) Shoulder Extension: 15 reps;Theraband Theraband Level (Shoulder Extension): Level 3 (Green) Other Standing Lumbar Exercises: Bilateral UE flexion against wall 10 reps Other Standing Lumbar Exercises: Corner stretch for chest 3X30" Supine Ab Set: 10 reps Clam:  (HEP) Bent Knee Raise:  (time) Bridge: 15 reps Straight Leg Raise: 10 reps Isometric Hip Flexion:  (HEP)  Modalities Modalities: Traction Traction Type of Traction: Lumbar Min (lbs): n/a Max (lbs): 115 (pt. weighs 238#) Hold Time: Static Rest Time: n/a Time: 31'  Physical Therapy Assessment and Plan PT Assessment and Plan Clinical Impression Statement: Pt completes therex well with minimal need for cueing. Pt educated on the benefits of therex and mechanical traction for treating a buldging disc. Continue mechanical traction secondary to positive results with previous tx. Pt describes signs of centralization of pain since begining therapy. PT Plan: Progress stability exercises and add lunges next visit.     Problem List Patient Active Problem List  Diagnosis  . DM  . HYPERLIPIDEMIA  . ANXIETY  . DEPRESSION  . HYPERTENSION  . ASTHMA, UNSPECIFIED  . GASTROPARESIS  . HIATAL HERNIA  . FATTY  LIVER DISEASE  . TIBIALIS TENDINITIS  . TRIGGER FINGER, LEFT MIDDLE  . TRIGGER FINGER  . DYSPHAGIA UNSPECIFIED  . PANCREATITIS, HX OF  . GASTRITIS, HX OF  . Acquired trigger finger  . Plantar fasciitis  . Dyspepsia  . Encounter for screening colonoscopy  . Chest pain  . Ejection fraction  . Greater trochanteric bursitis  . Sinus tarsi syndrome  . Lumbar pain with radiation down left leg  . Trigger finger, acquired  . S/P carpal tunnel release    PT - End of Session Activity Tolerance: Patient tolerated treatment well General Behavior During Session: [facility] for tasks performed Cognition: [facility] for tasks performed  Seth Bake, PTA  02/24/2013, 4:41 PM

## 2013-02-26 ENCOUNTER — Ambulatory Visit (HOSPITAL_COMMUNITY)
Admission: RE | Admit: 2013-02-26 | Discharge: 2013-02-26 | Disposition: A | Discharge status: Home / Self Care | Payer: 59 | Source: Ambulatory Visit | Attending: Family Medicine | Admitting: Family Medicine

## 2013-02-26 NOTE — Progress Notes (Signed)
Physical Therapy Treatment Patient Details  Name: Jamie Harding MRN: [id number] Date of Birth: [birth year]  Today's Date: 02/26/2013 Time: 4098-1191 PT Time Calculation (min): 55 min  Visit#: 7 of 8  Re-eval: 03/06/13 Charges: Therex x 30' Mechanical traction x 15'  Authorization: UMR    Subjective: Symptoms/Limitations Symptoms: Pt states that her radicular sx are elevated today.  Pain Assessment Currently in Pain?: Yes Pain Score:   7 Pain Location: Back Pain Orientation: Mid;Right;Lower Pain Radiating Towards: R posterior   Exercise/Treatments Standing Scapular Retraction: 15 reps;Theraband Theraband Level (Scapular Retraction): Level 3 (Green) Row: Theraband;15 reps Theraband Level (Row): Level 3 (Green) Shoulder Extension: 15 reps;Theraband Theraband Level (Shoulder Extension): Level 3 (Green) Other Standing Lumbar Exercises: Bilateral UE flexion against wall 10 reps Other Standing Lumbar Exercises: Corner stretch for chest 3X30" Supine Bridge: 15 reps Straight Leg Raise: 10 reps Prone Opposite arm/leg raise x 10  Modalities Modalities: Traction Traction Type of Traction: Lumbar Min (lbs): n/a Max (lbs): 115 (pt. weighs 238#) Hold Time: Static Rest Time: n/a Time: 12'  Physical Therapy Assessment and Plan PT Assessment and Plan Clinical Impression Statement: Pt completes therex well with improved form and minimal need for cueing. Continued mechanical lumbar traction secondary to beneficial results with previous treatments. Pt reports pain decrease to 4/10 at end of session. PT Plan: Progress stability exercises and add lunges next visit.     Problem List Patient Active Problem List  Diagnosis  . DM  . HYPERLIPIDEMIA  . ANXIETY  . DEPRESSION  . HYPERTENSION  . ASTHMA, UNSPECIFIED  . GASTROPARESIS  . HIATAL HERNIA  . FATTY LIVER DISEASE  . TIBIALIS TENDINITIS  . TRIGGER FINGER, LEFT MIDDLE  . TRIGGER FINGER  . DYSPHAGIA UNSPECIFIED  .  PANCREATITIS, HX OF  . GASTRITIS, HX OF  . Acquired trigger finger  . Plantar fasciitis  . Dyspepsia  . Encounter for screening colonoscopy  . Chest pain  . Ejection fraction  . Greater trochanteric bursitis  . Sinus tarsi syndrome  . Lumbar pain with radiation down left leg  . Trigger finger, acquired  . S/P carpal tunnel release    PT - End of Session Activity Tolerance: Patient tolerated treatment well General Behavior During Session: [facility] for tasks performed Cognition: [facility] for tasks performed  Seth Bake, PTA 02/26/2013, 4:21 PM

## 2013-03-03 ENCOUNTER — Ambulatory Visit (HOSPITAL_COMMUNITY): Payer: 59 | Admitting: *Deleted

## 2013-03-05 ENCOUNTER — Ambulatory Visit (HOSPITAL_COMMUNITY)
Admission: RE | Admit: 2013-03-05 | Discharge: 2013-03-05 | Disposition: A | Discharge status: Home / Self Care | Payer: 59 | Source: Ambulatory Visit | Attending: Orthopedic Surgery | Admitting: Orthopedic Surgery

## 2013-03-05 NOTE — Evaluation (Signed)
Physical Therapy Re-Evaluation  Patient Details  Name: Jamie Harding MRN: [id number] Date of Birth: 01/29/1956  Today's Date: 03/05/2013 Time: 4098-1191 PT Time Calculation (min): 47 min             Visit#: 8 of 16  Re-eval: 04/02/13 Charges: MMT x 1 ROMM x1 Traction x 1  Subjective Symptoms/Limitations Symptoms: Pt states that pain has centralized. Pt has no pain yesterday but pain returned after completing house work.  Pain Assessment Currently in Pain?: Yes Pain Score:   5 Pain Location: Back Pain Orientation: Mid;Lower  RLE Strength Right Hip Flexion: 5/5 Right Hip Extension: 5/5 Right Hip ABduction: 5/5 Right Hip ADduction: 5/5 Right Knee Flexion: 5/5 Right Knee Extension: 5/5 Right Ankle Dorsiflexion: 5/5 LLE Strength Left Hip Flexion:  (4+/5 was 4/5) Left Hip Extension: 4/5 (was 3+/5) Left Hip ABduction: 5/5 Left Hip ADduction: 4/5 (was 3+/5) Left Knee Flexion: 4/5 (was 3+/5) Left Knee Extension:  (4+/5 was 4/5) Left Ankle Dorsiflexion: 4/5 (was 3/5) Lumbar AROM Lumbar Flexion: wnl Lumbar Extension: wnl Lumbar - Right Side Bend: wnl Lumbar - Left Side Bend: wnl Lumbar - Right Rotation: wnl with discomfort Lumbar - Left Rotation: wnl  Exercise/Treatments  Modalities Modalities: Traction Traction Type of Traction: Lumbar Min (lbs): n/a Max (lbs): 115 (pt. weighs 238#) Hold Time: Static Rest Time: n/a Time: 6'  Physical Therapy Assessment and Plan PT Assessment and Plan Clinical Impression Statement: Pt has progressed well therapy. Pt displays improved strength and decreased pain. Pt also describes signs of centralization of pain. Pt would benefit from continuing therapy to improve strength and decrease pain further to improve QOL. Frequency: 2x/week Duration: 4 weeks PT Plan: Recommend to continue to progress x 4 weeks. Progress stability exercises and lower extremity strength.    Goals Home Exercise Program Pt will Perform Home Exercise  Program: Independently PT Short Term Goals 2 weeks PT Short Term Goal 1: Pt pain to be no greater than a 5/10: Met PT Short Term Goal 2: Pt to be able to verbalize and demonstrate the importance of proper body mechanics while doing funcitonal tasks.: Met PT Short Term Goal 3: Pt to be able to ride in a car for an hour without increase pain: Progressing toward goal (Pt can ride in a car for 30 minutes.) PT Short Term Goal 4: Pt to be able to stand for 15 min. to make a small meal without increased pain.: Met PT Long Term Goals: 4 weeks PT Long Term Goal 1: I in advance HEP: Met PT Long Term Goal 2: Pain no greater than a 2/10 : Progressing toward goal Long Term Goal 3: Pt to be able to sit for two hours without pain to be able to enjoy a movie: Progressing toward goal Long Term Goal 4: Pt to be able to stand for 30 minutes to make a meal without increased Pain.: Progressing toward goal PT Long Term Goal 5: Pt strength to be wnl to allow the above to occur.: Progressing towards goal  Problem List Patient Active Problem List  Diagnosis  . DM  . HYPERLIPIDEMIA  . ANXIETY  . DEPRESSION  . HYPERTENSION  . ASTHMA, UNSPECIFIED  . GASTROPARESIS  . HIATAL HERNIA  . FATTY LIVER DISEASE  . TIBIALIS TENDINITIS  . TRIGGER FINGER, LEFT MIDDLE  . TRIGGER FINGER  . DYSPHAGIA UNSPECIFIED  . PANCREATITIS, HX OF  . GASTRITIS, HX OF  . Acquired trigger finger  . Plantar fasciitis  . Dyspepsia  . Encounter for  screening colonoscopy  . Chest pain  . Ejection fraction  . Greater trochanteric bursitis  . Sinus tarsi syndrome  . Lumbar pain with radiation down left leg  . Trigger finger, acquired  . S/P carpal tunnel release    PT - End of Session Activity Tolerance: Patient tolerated treatment well General Behavior During Therapy: WFL for tasks assessed/performed Cognition: WFL for tasks performed  Seth Bake, PTA/ Annett Fabian, MPT/ATC 03/05/2013, 3:59 PM  Physician  Documentation Your signature is required to indicate approval of the treatment plan as stated above.  Please sign and either send electronically or make a copy of this report for your files and return this physician signed original.   Please mark one 1.__approve of plan  2. ___approve of plan with the following conditions.   ______________________________                                                          _____________________ Physician Signature                                                                                                             Date

## 2013-03-09 ENCOUNTER — Ambulatory Visit: Payer: 59 | Admitting: Cardiology

## 2013-03-10 ENCOUNTER — Ambulatory Visit (HOSPITAL_COMMUNITY)
Admission: RE | Admit: 2013-03-10 | Discharge: 2013-03-10 | Disposition: A | Discharge status: Home / Self Care | Payer: 59 | Source: Ambulatory Visit | Attending: Orthopedic Surgery | Admitting: Orthopedic Surgery

## 2013-03-10 NOTE — Progress Notes (Signed)
Physical Therapy Treatment Patient Details  Name: Jamie Harding MRN: [id number] Date of Birth: 08-Oct-1956  Today's Date: 03/10/2013 Time: 7846-9629 PT Time Calculation (min): 50 min  Visit#: 9 of 16  Re-eval: 04/02/13 Charges: Therex x 22' Manual x 8' Traction x 1  Authorization: UMR    Subjective: Symptoms/Limitations Symptoms: Pt reports pain in both legs but no pain in her back. Pain Assessment Currently in Pain?: Yes Pain Score:   6 Pain Location: Hip Pain Orientation: Right;Left Pain Radiating Towards: To bilateral knees  Exercise/Treatments Standing Functional Squats: 10 reps Supine Straight Leg Raise: 10 reps Large Ball Abdominal Isometric: 10 reps Large Ball Oblique Isometric: 10 reps Prone  Opposite Arm/Leg Raise: 10 reps  Modalities Modalities: Traction Manual Therapy Manual Therapy: Other (comment) Other Manual Therapy: Sciatic nerve glide BLE Traction Type of Traction: Lumbar Min (lbs): n/a Max (lbs): 115 (pt. weighs 238#) Hold Time: Static Rest Time: n/a Time: 80'  Physical Therapy Assessment and Plan PT Assessment and Plan Clinical Impression Statement: Progressed stabilization exercises with good technique after initial cueing. Completed nerve glides to bilateral lower extremities to decrease radicular sx. Continued mechanical lumbar traction secondary to beneficial results with previous sessions. PT Plan: Continue to progress stability exercises and lower extremity strength per PT POC.     Problem List Patient Active Problem List  Diagnosis  . DM  . HYPERLIPIDEMIA  . ANXIETY  . DEPRESSION  . HYPERTENSION  . ASTHMA, UNSPECIFIED  . GASTROPARESIS  . HIATAL HERNIA  . FATTY LIVER DISEASE  . TIBIALIS TENDINITIS  . TRIGGER FINGER, LEFT MIDDLE  . TRIGGER FINGER  . DYSPHAGIA UNSPECIFIED  . PANCREATITIS, HX OF  . GASTRITIS, HX OF  . Acquired trigger finger  . Plantar fasciitis  . Dyspepsia  . Encounter for screening colonoscopy  .  Chest pain  . Ejection fraction  . Greater trochanteric bursitis  . Sinus tarsi syndrome  . Lumbar pain with radiation down left leg  . Trigger finger, acquired  . S/P carpal tunnel release    PT - End of Session Activity Tolerance: Patient tolerated treatment well General Behavior During Therapy: WFL for tasks assessed/performed Cognition: WFL for tasks performed  Seth Bake, PTA  03/10/2013, 4:23 PM

## 2013-03-12 ENCOUNTER — Inpatient Hospital Stay (HOSPITAL_COMMUNITY): Admission: RE | Admit: 2013-03-12 | Payer: 59 | Source: Ambulatory Visit | Admitting: Physical Therapy

## 2013-03-16 ENCOUNTER — Encounter: Payer: Self-pay | Admitting: Family Medicine

## 2013-03-16 ENCOUNTER — Ambulatory Visit (INDEPENDENT_AMBULATORY_CARE_PROVIDER_SITE_OTHER): Payer: 59 | Admitting: Family Medicine

## 2013-03-16 VITALS — BP 130/76 | HR 70 | Ht 62.0 in | Wt 237.5 lb

## 2013-03-16 DIAGNOSIS — M545 Low back pain, unspecified: Secondary | ICD-10-CM

## 2013-03-16 MED ORDER — FLUCONAZOLE 150 MG PO TABS: ORAL_TABLET | ORAL | Status: DC

## 2013-03-16 NOTE — Progress Notes (Signed)
  Subjective:    Patient ID: Jamie Harding, female    DOB: [birth year], 57 y.o.   MRN: [id number]  Back Pain This is a chronic problem. The problem occurs constantly. The problem has been gradually improving since onset. The pain is present in the lumbar spine. The quality of the pain is described as shooting. The pain is at a severity of 5/10. The pain is moderate. The pain is the same all the time. The treatment provided mild relief.  this patient is going through some physical therapy it seems to be helping a little bit. She still has severe pain in her lower back radiates down the legs. She is unable to do her job because of the pain and discomfort. Physical therapy hopefully will improve her and she has an appointment in May to see the neurosurgeon. She states that she either has severe pain in her lower back or radiation down the legs on a daily basis.  Abnormal MRI please see report    Review of Systems  Musculoskeletal: Positive for back pain.       Objective:   Physical Exam Low back moderate tenderness negative straight leg raise lungs clear heart regular patient has difficulty getting onto and off the exam table because of low back pain and discomfort.       Assessment & Plan:  Lumbar pain with abnormal MRI-see the neurosurgeon in may. She has a scheduled followup in approximately one month for her diabetes. Check hemoglobin A1c at that visit. She may use hydrocodone for severe pain. She was instructed to followup if progressive symptoms before then otherwise keep the next appointment. Patient is unable to work currently because of her back issues and is yet to be seen if she is going to be able to return to work. Patient also has significant problems with blood pressure and diabetes.

## 2013-03-17 ENCOUNTER — Encounter: Payer: Self-pay | Admitting: Orthopedic Surgery

## 2013-03-17 ENCOUNTER — Ambulatory Visit (INDEPENDENT_AMBULATORY_CARE_PROVIDER_SITE_OTHER): Payer: 59 | Admitting: Orthopedic Surgery

## 2013-03-17 ENCOUNTER — Ambulatory Visit (HOSPITAL_COMMUNITY)
Admission: RE | Admit: 2013-03-17 | Discharge: 2013-03-17 | Disposition: A | Discharge status: Home / Self Care | Payer: 59 | Source: Ambulatory Visit | Attending: Orthopedic Surgery | Admitting: Orthopedic Surgery

## 2013-03-17 VITALS — BP 150/80 | Ht 62.0 in | Wt 235.0 lb

## 2013-03-17 DIAGNOSIS — Z9889 Other specified postprocedural states: Secondary | ICD-10-CM

## 2013-03-17 DIAGNOSIS — M6530 Trigger finger, unspecified finger: Secondary | ICD-10-CM

## 2013-03-17 NOTE — Patient Instructions (Signed)
Activities as tolerated. 

## 2013-03-17 NOTE — Progress Notes (Signed)
Patient ID: Jamie Harding, female   DOB: 01/24/1956, 57 y.o.   MRN: [id number] Chief Complaint  Patient presents with  . Follow-up    4 week recheck on left CTR. DOS 02-06-13.    Postop visit 6 weeks left carpal tunnel release and trigger finger release same hand  Doing well she did have a little bit of drainage which she lanced herself and since that time other than some sensitivity around the incision (normal) no problems no clicking no locking with the trigger finger release  Followup as needed

## 2013-03-17 NOTE — Progress Notes (Signed)
Physical Therapy Treatment Patient Details  Name: Jamie Harding MRN: [id number] Date of Birth: 08-02-1956 Charge:  There ex x 24; traction Today's Date: 03/17/2013 Time: 4098-1191 PT Time Calculation (min): 48 min  Visit#: 10 of 16  Re-eval: 04/02/13    Authorization: UMR  Subjective: Symptoms/Limitations Symptoms: Pt states that she has been watching her body mechanics; she is able to do a little more around the house. Pain Assessment Pain Score:   5 Pain Location: Leg Pain Orientation: Right;Left (mid thigh levle)    Exercise/Treatments  Stretches Active Hamstring Stretch: 3 reps;30 seconds   Supine Bridge: 10 reps Straight Leg Raise: 10 reps Sidelying Hip Abduction: 10 reps Prone  Straight Leg Raise: 10 reps Opposite Arm/Leg Raise: 10 reps Other Prone Lumbar Exercises: shoulder extension x 10    Modalities Modalities: Traction Traction Type of Traction: Lumbar Max (lbs): 115 (pt. weighs 238#) Hold Time: Static Rest Time: n/a Time: 56'  Physical Therapy Assessment and Plan PT Assessment and Plan Clinical Impression Statement: Pt does well with stabilization program with only occasional verbal cuing needed  added LE abduction   Pt will benefit from skilled therapeutic intervention in order to improve on the following deficits: Pain;Decreased strength;Decreased activity tolerance Rehab Potential: Good PT Frequency: Min 2X/week PT Duration: 4 weeks PT Treatment/Interventions: Modalities;Manual techniques;Therapeutic exercise PT Plan: begin wall stabilization as well as all four SAR,SLR    Goals Home Exercise Program Pt will Perform Home Exercise Program: Independently PT Goal: Perform Home Exercise Program - Progress: Met PT Short Term Goals PT Short Term Goal 1: Pt pain to be no greater than a 5/10 PT Short Term Goal 1 - Progress: Progressing toward goal PT Short Term Goal 2: Pt to be able to verbalize and demonstrate the importance of proper body  mechanics while doing funcitonal tasks. PT Short Term Goal 2 - Progress: Progressing toward goal PT Short Term Goal 3: Pt to be able to ride in a car for an hour without increase pain PT Short Term Goal 3 - Progress: Progressing toward goal PT Short Term Goal 4: Pt to be able to stand for 15 min. to make a small meal without increased pain. PT Short Term Goal 4 - Progress: Progressing toward goal PT Long Term Goals PT Long Term Goal 1: I in advance HEP PT Long Term Goal 1 - Progress: Met PT Long Term Goal 2: Pain no greater than a 2/10  PT Long Term Goal 2 - Progress: Not met Long Term Goal 3: Pt to be able to sit for two hours without pain to be able to enjoy a movie Long Term Goal 3 Progress: Progressing toward goal Long Term Goal 4: Pt to be able to stand for 30 minutes to make a meal without increased Pain. Long Term Goal 4 Progress: Progressing toward goal PT Long Term Goal 5: Pt strength to be wnl to allow the above to occur. Long Term Goal 5 Progress: Progressing toward goal  Problem List Patient Active Problem List   Diagnosis Date Noted  . Trigger finger, acquired 02/17/2013  . S/P carpal tunnel release 02/17/2013  . Lumbar pain with radiation down left leg 02/16/2013  . Greater trochanteric bursitis 01/29/2013  . Sinus tarsi syndrome 01/29/2013  . Chest pain   . Ejection fraction   . Dyspepsia 08/28/2012  . Encounter for screening colonoscopy 08/28/2012  . Plantar fasciitis 12/20/2011  . TRIGGER FINGER, LEFT MIDDLE 07/19/2010  . GASTROPARESIS 07/19/2009  . DM 07/15/2009  . HYPERLIPIDEMIA  07/15/2009  . ANXIETY 07/15/2009  . DEPRESSION 07/15/2009  . HYPERTENSION 07/15/2009  . ASTHMA, UNSPECIFIED 07/15/2009  . HIATAL HERNIA 07/15/2009  . FATTY LIVER DISEASE 07/15/2009  . DYSPHAGIA UNSPECIFIED 07/15/2009  . PANCREATITIS, HX OF 07/15/2009  . GASTRITIS, HX OF 07/15/2009  . TIBIALIS TENDINITIS 07/12/2008    PT - End of Session Activity Tolerance: Patient tolerated  treatment well General Behavior During Therapy: WFL for tasks assessed/performed Cognition: WFL for tasks performed PT Plan of Care PT Home Exercise Plan: given  GP    RUSSELL,CINDY 03/17/2013, 3:12 PM

## 2013-03-18 ENCOUNTER — Encounter: Payer: Self-pay | Admitting: Family Medicine

## 2013-03-18 ENCOUNTER — Encounter: Payer: Self-pay | Admitting: Cardiology

## 2013-03-19 ENCOUNTER — Inpatient Hospital Stay (HOSPITAL_COMMUNITY): Admission: RE | Admit: 2013-03-19 | Payer: 59 | Source: Ambulatory Visit | Admitting: Physical Therapy

## 2013-03-24 ENCOUNTER — Ambulatory Visit (HOSPITAL_COMMUNITY)
Admission: RE | Admit: 2013-03-24 | Discharge: 2013-03-24 | Disposition: A | Discharge status: Home / Self Care | Payer: 59 | Source: Ambulatory Visit | Attending: Family Medicine | Admitting: Family Medicine

## 2013-03-24 DIAGNOSIS — M545 Low back pain, unspecified: Secondary | ICD-10-CM | POA: Insufficient documentation

## 2013-03-24 DIAGNOSIS — IMO0001 Reserved for inherently not codable concepts without codable children: Secondary | ICD-10-CM | POA: Insufficient documentation

## 2013-03-24 DIAGNOSIS — M6281 Muscle weakness (generalized): Secondary | ICD-10-CM | POA: Insufficient documentation

## 2013-03-24 DIAGNOSIS — I10 Essential (primary) hypertension: Secondary | ICD-10-CM | POA: Insufficient documentation

## 2013-03-24 NOTE — Progress Notes (Signed)
Physical Therapy Treatment Patient Details  Name: Jamie Harding MRN: [id number] Date of Birth: [birth year]  Today's Date: 03/24/2013 Time: 1522-1600 PT Time Calculation (min): 38 min Charge: therex 41'  Visit#: 11 of 16  Re-eval: 04/02/13 Assessment Diagnosis: lumbar pain Prior Therapy: none  Authorization: UMR   Subjective: Symptoms/Limitations Symptoms: Pt reported pain free today. Pain Assessment Currently in Pain?: No/denies  Objective:   Exercise/Treatments Stretches Active Hamstring Stretch: 3 reps;30 seconds Standing Other Standing Lumbar Exercises: wall push up with chin tucks Other Standing Lumbar Exercises: stability exercise against wall with shoulder flexion and cueing for ab set Seated Other Seated Lumbar Exercises: add sitting on theraball core strengthening exercises next session Supine Bridge: 10 reps;Limitations Bridge Limitations: on green ball, 5 hamstring curls with bridge Large Ball Abdominal Isometric: 10 reps Large Ball Oblique Isometric: 10 reps Prone  Other Prone Lumbar Exercises: wback, rows, shoulder extension 10 reps each Quadruped Single Arm Raise: 5 reps Straight Leg Raise: 5 reps Opposite Arm/Leg Raise: Left arm/Right leg;Limitations Opposite Arm/Leg Raise Limitations: 1 rep unable to bear weight on Lt arm     Physical Therapy Assessment and Plan PT Assessment and Plan Clinical Impression Statement: Progressed lumbar strengthening and core stabilty exercises with min cueing required for core activation.  Instructed theraball activities for pt to add to her HEP theraball exercises at home.  Progressed to quadruped exercises, pt did c/o slight irritation to Lt hand pain with increased pressure. PT Plan: Continue with current POC,  add seated theraball exercises to encourage more use of theraball HEP at home (hip anterior/posterior rotation, LAQ or dead bug with core activation, etc...    Goals    Problem List Patient Active Problem  List   Diagnosis Date Noted  . Trigger finger, acquired 02/17/2013  . S/P carpal tunnel release 02/17/2013  . Lumbar pain with radiation down left leg 02/16/2013  . Greater trochanteric bursitis 01/29/2013  . Sinus tarsi syndrome 01/29/2013  . Chest pain   . Ejection fraction   . Dyspepsia 08/28/2012  . Encounter for screening colonoscopy 08/28/2012  . Plantar fasciitis 12/20/2011  . TRIGGER FINGER, LEFT MIDDLE 07/19/2010  . GASTROPARESIS 07/19/2009  . DM 07/15/2009  . HYPERLIPIDEMIA 07/15/2009  . ANXIETY 07/15/2009  . DEPRESSION 07/15/2009  . HYPERTENSION 07/15/2009  . ASTHMA, UNSPECIFIED 07/15/2009  . HIATAL HERNIA 07/15/2009  . FATTY LIVER DISEASE 07/15/2009  . DYSPHAGIA UNSPECIFIED 07/15/2009  . PANCREATITIS, HX OF 07/15/2009  . GASTRITIS, HX OF 07/15/2009  . TIBIALIS TENDINITIS 07/12/2008    PT - End of Session Activity Tolerance: Patient tolerated treatment well General Behavior During Therapy: [facility] for tasks assessed/performed Cognition: WFL for tasks performed  GP    Juel Burrow 03/24/2013, 4:14 PM

## 2013-03-25 ENCOUNTER — Encounter: Payer: Self-pay | Admitting: Family Medicine

## 2013-03-25 ENCOUNTER — Telehealth: Payer: Self-pay | Admitting: Family Medicine

## 2013-03-25 NOTE — Telephone Encounter (Signed)
[employer], is requesting a formal letter signed by her Doctor stating she is released from work until May 20th. I sent one from our computer system for her but it only had my name attached to it, an they will not accept that particular letter. Thank You

## 2013-03-26 ENCOUNTER — Inpatient Hospital Stay (HOSPITAL_COMMUNITY): Admission: RE | Admit: 2013-03-26 | Payer: 59 | Source: Ambulatory Visit | Admitting: *Deleted

## 2013-03-31 ENCOUNTER — Inpatient Hospital Stay (HOSPITAL_COMMUNITY): Admission: RE | Admit: 2013-03-31 | Payer: 59 | Source: Ambulatory Visit

## 2013-04-02 ENCOUNTER — Inpatient Hospital Stay (HOSPITAL_COMMUNITY): Admission: RE | Admit: 2013-04-02 | Payer: 59 | Source: Ambulatory Visit | Admitting: Physical Therapy

## 2013-04-08 ENCOUNTER — Encounter: Payer: Self-pay | Admitting: Family Medicine

## 2013-05-01 ENCOUNTER — Other Ambulatory Visit: Payer: Self-pay | Admitting: Neurosurgery

## 2013-05-01 DIAGNOSIS — M549 Dorsalgia, unspecified: Secondary | ICD-10-CM

## 2013-05-04 ENCOUNTER — Ambulatory Visit
Admission: RE | Admit: 2013-05-04 | Discharge: 2013-05-04 | Disposition: A | Discharge status: Home / Self Care | Payer: 59 | Source: Ambulatory Visit | Attending: Neurosurgery | Admitting: Neurosurgery

## 2013-05-04 DIAGNOSIS — M549 Dorsalgia, unspecified: Secondary | ICD-10-CM

## 2013-05-04 MED ORDER — IOHEXOL 180 MG/ML  SOLN
1.0000 mL | Freq: Once | INTRAMUSCULAR | Status: AC | PRN
  Administered 2013-05-04: 1 mL via EPIDURAL

## 2013-05-04 MED ORDER — METHYLPREDNISOLONE ACETATE 40 MG/ML INJ SUSP (RADIOLOG
120.0000 mg | Freq: Once | INTRAMUSCULAR | Status: AC
  Administered 2013-05-04: 120 mg via EPIDURAL

## 2013-05-12 ENCOUNTER — Ambulatory Visit (INDEPENDENT_AMBULATORY_CARE_PROVIDER_SITE_OTHER): Payer: 59 | Admitting: Family Medicine

## 2013-05-12 ENCOUNTER — Encounter: Payer: Self-pay | Admitting: Family Medicine

## 2013-05-12 VITALS — BP 126/80 | HR 80 | Ht 62.25 in | Wt 227.2 lb

## 2013-05-12 DIAGNOSIS — E785 Hyperlipidemia, unspecified: Secondary | ICD-10-CM

## 2013-05-12 DIAGNOSIS — E119 Type 2 diabetes mellitus without complications: Secondary | ICD-10-CM

## 2013-05-12 DIAGNOSIS — Z79899 Other long term (current) drug therapy: Secondary | ICD-10-CM

## 2013-05-12 DIAGNOSIS — I10 Essential (primary) hypertension: Secondary | ICD-10-CM

## 2013-05-12 MED ORDER — CANAGLIFLOZIN 300 MG PO TABS: 300.0000 mg | ORAL_TABLET | Freq: Every day | ORAL | Status: DC

## 2013-05-12 MED ORDER — PANTOPRAZOLE SODIUM 40 MG PO TBEC: 40.0000 mg | DELAYED_RELEASE_TABLET | Freq: Every day | ORAL | Status: DC

## 2013-05-12 NOTE — Progress Notes (Signed)
  Subjective:    Patient ID: Jamie Harding, female    DOB: 09-19-1956, 57 y.o.   MRN: [id number]  Diabetes She presents for her follow-up diabetic visit. She has type 2 diabetes mellitus. Her disease course has been stable. There are no hypoglycemic associated symptoms. There are no diabetic associated symptoms. There are no hypoglycemic complications. Symptoms are stable. There are no diabetic complications. There are no known risk factors for coronary artery disease. Current diabetic treatment includes insulin injections. She is compliant with treatment all of the time. She is following a generally healthy diet.   this patient relates that she could be doing better with her diet. She does state she is compliant with her insulin she denies any particular problems. She has not been as active lately because of her back. She is now in work and she is more active than what she was area Patient states that she can no longer afford Nexium she would like to use protonic 6 she hopes that this will help keep her reflux under control she does not take her Nexium she does have significant troubles Patient states pain under fair control with medication not having he uses as much since have an injection in her neck Platelets patient relates cholesterol doing well with medication relates good compliance Patient states moods are doing well with taking her medication denies being depressed Past medical history family history social history all reviewed patient does not smoke    Review of Systems Patient denies excessive thirst no chest pressure tightness PND denies vomiting or diarrhea denies swelling in the legs.    Objective:   Physical Exam Foot exam normal pulses are normal calf nontender lungs are clear heart is regular abdomen soft obese skin warm dry neurologic grossly normal       Assessment & Plan:  #1 diabetes subpar control I encouraged her to adjust her long acting insulin and short acting  insulin avoid low sugar spells. Also Invokana 300 mg is the new dose. I would like to see her back in 3 months to check a hemoglobin A1c. She will report to [name] if any problems. She will work hard on diet and exercise #2 reflux try protonic to see if that does well stay off Nexium for now #3 neuropathy and back pain doing well currently continue current meds #4 patient was seen for 25-30 minutes the bulk of this was spent on her diabetes [id number].

## 2013-05-22 LAB — LIPID PANEL
Cholesterol: 189 mg/dL (ref 0–200)
LDL Cholesterol: 119 mg/dL — ABNORMAL HIGH (ref 0–99)
Triglycerides: 68 mg/dL (ref <150)

## 2013-05-22 LAB — HEPATIC FUNCTION PANEL
AST: 13 U/L (ref 0–37)
Albumin: 3.9 g/dL (ref 3.5–5.2)
Alkaline Phosphatase: 79 U/L (ref 39–117)
Total Bilirubin: 0.3 mg/dL (ref 0.3–1.2)
Total Protein: 6.7 g/dL (ref 6.0–8.3)

## 2013-05-22 LAB — BASIC METABOLIC PANEL
CO2: 26 mEq/L (ref 19–32)
Calcium: 9.6 mg/dL (ref 8.4–10.5)
Creat: 0.82 mg/dL (ref 0.50–1.10)
Sodium: 142 mEq/L (ref 135–145)

## 2013-05-24 ENCOUNTER — Encounter: Payer: Self-pay | Admitting: Family Medicine

## 2013-08-01 ENCOUNTER — Other Ambulatory Visit: Payer: Self-pay | Admitting: Gastroenterology

## 2013-08-01 ENCOUNTER — Other Ambulatory Visit: Payer: Self-pay | Admitting: Orthopedic Surgery

## 2013-08-03 ENCOUNTER — Other Ambulatory Visit: Payer: Self-pay | Admitting: *Deleted

## 2013-08-03 ENCOUNTER — Other Ambulatory Visit: Payer: Self-pay | Admitting: Family Medicine

## 2013-08-03 DIAGNOSIS — Z139 Encounter for screening, unspecified: Secondary | ICD-10-CM

## 2013-08-03 MED ORDER — PROMETHAZINE HCL 25 MG PO TABS: 25.0000 mg | ORAL_TABLET | Freq: Three times a day (TID) | ORAL | Status: DC | PRN

## 2013-08-03 MED ORDER — HYDROCODONE-ACETAMINOPHEN 5-325 MG PO TABS: 1.0000 | ORAL_TABLET | ORAL | Status: DC | PRN

## 2013-08-03 MED ORDER — ALPRAZOLAM 0.5 MG PO TABS: 0.5000 mg | ORAL_TABLET | Freq: Two times a day (BID) | ORAL | Status: DC | PRN

## 2013-08-04 ENCOUNTER — Other Ambulatory Visit: Payer: Self-pay

## 2013-08-04 MED ORDER — PROMETHAZINE HCL 25 MG PO TABS: 25.0000 mg | ORAL_TABLET | Freq: Three times a day (TID) | ORAL | Status: DC | PRN

## 2013-08-06 ENCOUNTER — Inpatient Hospital Stay (HOSPITAL_COMMUNITY): Admission: RE | Admit: 2013-08-06 | Payer: 59 | Source: Ambulatory Visit

## 2013-08-12 ENCOUNTER — Ambulatory Visit (INDEPENDENT_AMBULATORY_CARE_PROVIDER_SITE_OTHER): Payer: 59 | Admitting: Family Medicine

## 2013-08-12 ENCOUNTER — Encounter: Payer: Self-pay | Admitting: Family Medicine

## 2013-08-12 VITALS — BP 128/82 | Temp 98.2°F | Ht 62.0 in | Wt 229.6 lb

## 2013-08-12 DIAGNOSIS — E785 Hyperlipidemia, unspecified: Secondary | ICD-10-CM

## 2013-08-12 DIAGNOSIS — I10 Essential (primary) hypertension: Secondary | ICD-10-CM

## 2013-08-12 DIAGNOSIS — E119 Type 2 diabetes mellitus without complications: Secondary | ICD-10-CM

## 2013-08-12 DIAGNOSIS — K7689 Other specified diseases of liver: Secondary | ICD-10-CM

## 2013-08-12 DIAGNOSIS — R300 Dysuria: Secondary | ICD-10-CM

## 2013-08-12 DIAGNOSIS — M545 Low back pain: Secondary | ICD-10-CM

## 2013-08-12 DIAGNOSIS — R109 Unspecified abdominal pain: Secondary | ICD-10-CM

## 2013-08-12 LAB — POCT URINALYSIS DIPSTICK: pH, UA: 7

## 2013-08-12 LAB — POCT GLYCOSYLATED HEMOGLOBIN (HGB A1C): Hemoglobin A1C: 6.1

## 2013-08-12 MED ORDER — HYDROCODONE-ACETAMINOPHEN 5-325 MG PO TABS: 1.0000 | ORAL_TABLET | ORAL | Status: DC | PRN

## 2013-08-12 NOTE — Progress Notes (Signed)
  Subjective:    Patient ID: Jamie Harding, female    DOB: 11/01/1956, 57 y.o.   MRN: [id number]  HPI Patient is here today for 3 month diabetic check up. She states blood sugars have been WNL.  The patient was seen today as part of a comprehensive diabetic check up. The patient had the following elements completed: -Review of medication compliance -Review of glucose monitoring results -Review of any complications do to high or low sugars -Diabetic foot exam was completed as part of today's visit. The following was also discussed: -Importance of yearly eye exams -Importance of following diabetic/low sugar-starch diet -Importance of exercise and regular activity -Importance of regular followup visits. -Most recent hemoglobin A1c were reviewed with the patient along with goals regarding diabetes. Patient also relates moderate low back pain and knee pain she uses hydrocodone for her. She denies abusing it takes 3 or 4 per day does not cause her to feel drowsy. This patient was seen today for chronic pain  The medication list was reviewed and updated.  Discussion was held with the patient regarding compliance with pain medication. The patient was advised the importance of maintaining medication and not using illegal substances with these. The patient was educated that we can provide 3 monthly scripts for their medication, it is their responsibility to follow the instructions. Discussion was held with the patient to make sure they're not having significant side effects. Patient is aware that pain medications are meant to minimize the severity of the pain to allow their pain levels to improve to allow for better function. They are aware of that pain medications cannot totally remove their pain.   She has a history diabetes she is watching her diet she is taking her medicine as directed she also has history of fatty liver she is trying to do a good job taking her medicines in regards to that she  also has history of obesity she is trying to watch her weight and exercise She is also complaining of pain when voiding since Sunday.  She has a history of kidney stones. Family history reviewed She was encouraged to get yearly eye exam Review of Systems    see above. No chest pain shortness of breath she does relate some burning in the feet Objective:   Physical Exam Diabetic foot exam was completed Lungs are clear hearts regular pulse normal blood pressure good extremities no edema abdomen soft obese      Assessment & Plan:  #1 obesity-watch diet exercise try to bring down #2 hypertension good control Hyperlipidemia lab work ordered #3 diabetes decent control continue current measures. #4 low back and knee pain hydrocodone as directed Followup 3 months 25-30 minutes spent with patient

## 2013-08-14 ENCOUNTER — Telehealth: Payer: Self-pay | Admitting: Family Medicine

## 2013-08-14 ENCOUNTER — Other Ambulatory Visit: Payer: Self-pay | Admitting: Nurse Practitioner

## 2013-08-14 MED ORDER — ALBUTEROL SULFATE HFA 108 (90 BASE) MCG/ACT IN AERS: 2.0000 | INHALATION_SPRAY | RESPIRATORY_TRACT | Status: DC | PRN

## 2013-08-14 NOTE — Telephone Encounter (Signed)
Pt states she is on ventolin and would like a refill. Wants it sent to [facility].  Tried to locate in chart but only thing I could find was Proventil Inhaler hand written on the pt history list on left side of chart. No listed in Epic or On med list in chart.

## 2013-08-18 NOTE — Progress Notes (Signed)
Spoke with patient regarding: their test/xrays are normal, urine culture was negative. I do recommend for the patient to submit a urine specimen on her next followup visit so we can make sure there are no blood cells remaining in the urine. Pt verbalized understanding.

## 2013-08-19 ENCOUNTER — Other Ambulatory Visit: Payer: Self-pay | Admitting: Family Medicine

## 2013-08-19 ENCOUNTER — Encounter: Payer: Self-pay | Admitting: Family Medicine

## 2013-08-19 DIAGNOSIS — R300 Dysuria: Secondary | ICD-10-CM

## 2013-08-19 MED ORDER — DOXYCYCLINE HYCLATE 100 MG PO CAPS: 100.0000 mg | ORAL_CAPSULE | Freq: Two times a day (BID) | ORAL | Status: AC

## 2013-08-30 ENCOUNTER — Encounter: Payer: Self-pay | Admitting: Family Medicine

## 2013-09-01 DIAGNOSIS — Z0289 Encounter for other administrative examinations: Secondary | ICD-10-CM

## 2013-09-02 LAB — LIPID PANEL
Cholesterol: 195 mg/dL (ref 0–200)
LDL Cholesterol: 125 mg/dL — ABNORMAL HIGH (ref 0–99)
Triglycerides: 101 mg/dL (ref <150)

## 2013-09-10 ENCOUNTER — Encounter: Payer: Self-pay | Admitting: Family Medicine

## 2013-09-10 ENCOUNTER — Ambulatory Visit (INDEPENDENT_AMBULATORY_CARE_PROVIDER_SITE_OTHER): Payer: 59 | Admitting: Family Medicine

## 2013-09-10 ENCOUNTER — Ambulatory Visit (HOSPITAL_COMMUNITY)
Admission: RE | Admit: 2013-09-10 | Discharge: 2013-09-10 | Disposition: A | Discharge status: Home / Self Care | Payer: 59 | Source: Ambulatory Visit | Attending: Family Medicine | Admitting: Family Medicine

## 2013-09-10 DIAGNOSIS — Z1231 Encounter for screening mammogram for malignant neoplasm of breast: Secondary | ICD-10-CM | POA: Insufficient documentation

## 2013-09-10 DIAGNOSIS — Z139 Encounter for screening, unspecified: Secondary | ICD-10-CM

## 2013-09-10 DIAGNOSIS — K3184 Gastroparesis: Secondary | ICD-10-CM

## 2013-09-10 NOTE — Progress Notes (Signed)
Patient ID: Jamie Harding, female   DOB: 1956-03-09, 57 y.o.   MRN: [id number] 15-20 minutes was spent with patient discussing her back pain wrist pain sciatica kidney stones abdominal discomforts diabetes and gastroparesis. Time was spent discussing the medicine she is using that technique she is using and try to keep this under control and how it's impacting her at work often causing her to miss work. Greater than half the time was spent in discussion. [id number]

## 2013-09-12 ENCOUNTER — Other Ambulatory Visit: Payer: Self-pay | Admitting: Neurosurgery

## 2013-09-12 DIAGNOSIS — M5416 Radiculopathy, lumbar region: Secondary | ICD-10-CM

## 2013-09-14 ENCOUNTER — Encounter: Payer: Self-pay | Admitting: Nurse Practitioner

## 2013-09-14 ENCOUNTER — Ambulatory Visit (INDEPENDENT_AMBULATORY_CARE_PROVIDER_SITE_OTHER): Payer: 59 | Admitting: Nurse Practitioner

## 2013-09-14 VITALS — BP 124/80 | Ht 62.0 in | Wt 226.6 lb

## 2013-09-14 DIAGNOSIS — IMO0002 Reserved for concepts with insufficient information to code with codable children: Secondary | ICD-10-CM

## 2013-09-14 DIAGNOSIS — Z124 Encounter for screening for malignant neoplasm of cervix: Secondary | ICD-10-CM

## 2013-09-14 DIAGNOSIS — Z0000 Encounter for general adult medical examination without abnormal findings: Secondary | ICD-10-CM

## 2013-09-14 DIAGNOSIS — Z01419 Encounter for gynecological examination (general) (routine) without abnormal findings: Secondary | ICD-10-CM

## 2013-09-14 MED ORDER — GLUCOSE BLOOD VI STRP: ORAL_STRIP | Status: DC

## 2013-09-14 NOTE — Patient Instructions (Signed)
Progesterone cream Luvena 2-3 x per week as needed

## 2013-09-15 LAB — PAP IG W/ RFLX HPV ASCU

## 2013-09-16 ENCOUNTER — Encounter: Payer: Self-pay | Admitting: Nurse Practitioner

## 2013-09-16 DIAGNOSIS — IMO0002 Reserved for concepts with insufficient information to code with codable children: Secondary | ICD-10-CM | POA: Insufficient documentation

## 2013-09-16 NOTE — Progress Notes (Signed)
  Subjective:    Patient ID: Jamie Harding, female    DOB: Nov 07, 1956, 57 y.o.   MRN: [id number]  HPI presents for her wellness checkup. Has been losing weight. Regular walking program. No pelvic pain. No vaginal bleeding except after intercourse which causes significant pain in the vaginal area. Using lubrication. Otherwise no vaginal bleeding or menstrual cycles since age 66. Same sexual partner. No vaginal discharge. Regular eye exams. Regular dental exams.    Review of Systems  Constitutional: Positive for activity change. Negative for fever, appetite change and fatigue.  HENT: Negative for dental problem, ear pain, hearing loss and sore throat.   Eyes: Negative for visual disturbance.  Respiratory: Negative for cough, chest tightness, shortness of breath and wheezing.   Cardiovascular: Negative for chest pain and leg swelling.  Gastrointestinal: Negative for nausea, vomiting, abdominal pain, diarrhea, constipation and blood in stool.  Genitourinary: Positive for vaginal bleeding and dyspareunia. Negative for dysuria, urgency, frequency, vaginal discharge, enuresis, difficulty urinating and pelvic pain.  Psychiatric/Behavioral: Negative for sleep disturbance and dysphoric mood. The patient is not nervous/anxious.        Objective:   Physical Exam  Vitals reviewed. Constitutional: She is oriented to person, place, and time. She appears well-developed. No distress.  HENT:  Right Ear: External ear normal.  Left Ear: External ear normal.  Mouth/Throat: Oropharynx is clear and moist.  Neck: Normal range of motion. Neck supple. No tracheal deviation present. No thyromegaly present.  Cardiovascular: Normal rate, regular rhythm and normal heart sounds.  Exam reveals no gallop.   No murmur heard. Pulmonary/Chest: Effort normal and breath sounds normal.  Abdominal: Soft. She exhibits no distension. There is no tenderness.  Genitourinary: Vagina normal and uterus normal. No vaginal discharge  found.  Musculoskeletal: She exhibits no edema.  Lymphadenopathy:    She has no cervical adenopathy.  Neurological: She is alert and oriented to person, place, and time.  Skin: Skin is warm and dry. No rash noted.  Psychiatric: She has a normal mood and affect. Her behavior is normal.   Breast exam: No obvious masses, axilla no adenopathy. External GU normal. Vagina slightly pale but moist. No areas of irritation noted. Cervix normal limit in appearance, no CMT. Bimanual exam normal, no obvious masses but exam limited due to abdominal girth. Rectal exam: Normal, no stool for Hemoccult.        Assessment & Plan:  Well woman exam  Screening for cervical cancer - Plan: Pap IG w/ reflex to HPV when ASC-U, POC Hemoccult Bld/Stl (3-Cd Home Screen)  dypareunia most likely secondary to menopause  Meds ordered this encounter  Medications  . glucose blood test strip    Sig: Use as instructed    Dispense:  100 each    Refill:  12   encouraged to continue weight loss and activity. Daily vitamin D and calcium. Try Leatrice Jewels as directed, if no improvement call back. Consider using progesterone cream vaginally. Recheck in 3 months, next physical in one year.

## 2013-09-16 NOTE — Assessment & Plan Note (Signed)
Try Jamie Harding as directed, if no improvement call back. Consider using progesterone cream vaginally. Recheck in 3 months, next physical in one year.

## 2013-10-07 ENCOUNTER — Ambulatory Visit
Admission: RE | Admit: 2013-10-07 | Discharge: 2013-10-07 | Disposition: A | Discharge status: Home / Self Care | Payer: 59 | Source: Ambulatory Visit | Attending: Neurosurgery | Admitting: Neurosurgery

## 2013-10-07 VITALS — BP 120/77 | HR 68

## 2013-10-07 DIAGNOSIS — M5416 Radiculopathy, lumbar region: Secondary | ICD-10-CM

## 2013-10-07 MED ORDER — METHYLPREDNISOLONE ACETATE 40 MG/ML INJ SUSP (RADIOLOG
120.0000 mg | Freq: Once | INTRAMUSCULAR | Status: AC
  Administered 2013-10-07: 120 mg via EPIDURAL

## 2013-10-07 MED ORDER — IOHEXOL 180 MG/ML  SOLN
1.0000 mL | Freq: Once | INTRAMUSCULAR | Status: AC | PRN
  Administered 2013-10-07: 1 mL via EPIDURAL

## 2013-11-05 ENCOUNTER — Emergency Department (HOSPITAL_COMMUNITY)
Admission: EM | Admit: 2013-11-05 | Discharge: 2013-11-05 | Disposition: A | Discharge status: Home / Self Care | Payer: 59 | Attending: Emergency Medicine | Admitting: Emergency Medicine

## 2013-11-05 ENCOUNTER — Telehealth: Payer: Self-pay | Admitting: Family Medicine

## 2013-11-05 ENCOUNTER — Encounter (HOSPITAL_COMMUNITY): Payer: Self-pay | Admitting: Emergency Medicine

## 2013-11-05 DIAGNOSIS — Z79899 Other long term (current) drug therapy: Secondary | ICD-10-CM | POA: Insufficient documentation

## 2013-11-05 DIAGNOSIS — F3289 Other specified depressive episodes: Secondary | ICD-10-CM | POA: Insufficient documentation

## 2013-11-05 DIAGNOSIS — Z8669 Personal history of other diseases of the nervous system and sense organs: Secondary | ICD-10-CM | POA: Insufficient documentation

## 2013-11-05 DIAGNOSIS — Z794 Long term (current) use of insulin: Secondary | ICD-10-CM | POA: Insufficient documentation

## 2013-11-05 DIAGNOSIS — Z9089 Acquired absence of other organs: Secondary | ICD-10-CM | POA: Insufficient documentation

## 2013-11-05 DIAGNOSIS — K3184 Gastroparesis: Secondary | ICD-10-CM | POA: Insufficient documentation

## 2013-11-05 DIAGNOSIS — F411 Generalized anxiety disorder: Secondary | ICD-10-CM | POA: Insufficient documentation

## 2013-11-05 DIAGNOSIS — Z87891 Personal history of nicotine dependence: Secondary | ICD-10-CM | POA: Insufficient documentation

## 2013-11-05 DIAGNOSIS — K219 Gastro-esophageal reflux disease without esophagitis: Secondary | ICD-10-CM | POA: Insufficient documentation

## 2013-11-05 DIAGNOSIS — R197 Diarrhea, unspecified: Secondary | ICD-10-CM | POA: Insufficient documentation

## 2013-11-05 DIAGNOSIS — M069 Rheumatoid arthritis, unspecified: Secondary | ICD-10-CM | POA: Insufficient documentation

## 2013-11-05 DIAGNOSIS — E119 Type 2 diabetes mellitus without complications: Secondary | ICD-10-CM | POA: Insufficient documentation

## 2013-11-05 DIAGNOSIS — I10 Essential (primary) hypertension: Secondary | ICD-10-CM | POA: Insufficient documentation

## 2013-11-05 DIAGNOSIS — J45909 Unspecified asthma, uncomplicated: Secondary | ICD-10-CM | POA: Insufficient documentation

## 2013-11-05 DIAGNOSIS — E669 Obesity, unspecified: Secondary | ICD-10-CM | POA: Insufficient documentation

## 2013-11-05 DIAGNOSIS — Z8673 Personal history of transient ischemic attack (TIA), and cerebral infarction without residual deficits: Secondary | ICD-10-CM | POA: Insufficient documentation

## 2013-11-05 DIAGNOSIS — Z7982 Long term (current) use of aspirin: Secondary | ICD-10-CM | POA: Insufficient documentation

## 2013-11-05 DIAGNOSIS — Z9889 Other specified postprocedural states: Secondary | ICD-10-CM | POA: Insufficient documentation

## 2013-11-05 DIAGNOSIS — Z9851 Tubal ligation status: Secondary | ICD-10-CM | POA: Insufficient documentation

## 2013-11-05 DIAGNOSIS — IMO0002 Reserved for concepts with insufficient information to code with codable children: Secondary | ICD-10-CM | POA: Insufficient documentation

## 2013-11-05 DIAGNOSIS — F329 Major depressive disorder, single episode, unspecified: Secondary | ICD-10-CM | POA: Insufficient documentation

## 2013-11-05 DIAGNOSIS — E785 Hyperlipidemia, unspecified: Secondary | ICD-10-CM | POA: Insufficient documentation

## 2013-11-05 DIAGNOSIS — R109 Unspecified abdominal pain: Secondary | ICD-10-CM

## 2013-11-05 LAB — COMPREHENSIVE METABOLIC PANEL
ALT: 11 U/L (ref 0–35)
AST: 12 U/L (ref 0–37)
Alkaline Phosphatase: 104 U/L (ref 39–117)
BUN: 20 mg/dL (ref 6–23)
CO2: 23 mEq/L (ref 19–32)
GFR calc Af Amer: >90 mL/min (ref >90)
GFR calc non Af Amer: >90 mL/min (ref >90)
Glucose, Bld: 143 mg/dL — ABNORMAL HIGH (ref 70–99)
Potassium: 4 mEq/L (ref 3.5–5.1)
Sodium: 138 mEq/L (ref 135–145)

## 2013-11-05 LAB — CBC WITH DIFFERENTIAL/PLATELET
Basophils Absolute: 0 10*3/uL (ref 0.0–0.1)
Eosinophils Relative: 1 % (ref 0–5)
Lymphocytes Relative: 5 % — ABNORMAL LOW (ref 12–46)
Lymphs Abs: 0.9 10*3/uL (ref 0.7–4.0)
MCV: 86.4 fL (ref 78.0–100.0)
Monocytes Relative: 4 % (ref 3–12)
Neutro Abs: 15.9 10*3/uL — ABNORMAL HIGH (ref 1.7–7.7)
Neutrophils Relative %: 90 % — ABNORMAL HIGH (ref 43–77)
Platelets: 242 10*3/uL (ref 150–400)
RBC: 5.45 MIL/uL — ABNORMAL HIGH (ref 3.87–5.11)
WBC: 17.7 10*3/uL — ABNORMAL HIGH (ref 4.0–10.5)

## 2013-11-05 LAB — URINALYSIS, ROUTINE W REFLEX MICROSCOPIC
Bilirubin Urine: NEGATIVE
Glucose, UA: >1000 mg/dL — AB
Hgb urine dipstick: NEGATIVE
Protein, ur: NEGATIVE mg/dL
Specific Gravity, Urine: 1.02 (ref 1.005–1.030)
Urobilinogen, UA: 0.2 mg/dL (ref 0.0–1.0)

## 2013-11-05 LAB — URINE MICROSCOPIC-ADD ON

## 2013-11-05 MED ORDER — ONDANSETRON HCL 8 MG PO TABS: 8.0000 mg | ORAL_TABLET | Freq: Two times a day (BID) | ORAL | Status: DC | PRN

## 2013-11-05 MED ORDER — HYDROCODONE-ACETAMINOPHEN 5-325 MG PO TABS: 1.0000 | ORAL_TABLET | Freq: Four times a day (QID) | ORAL | Status: DC | PRN

## 2013-11-05 MED ORDER — ONDANSETRON HCL 4 MG/2ML IJ SOLN
4.0000 mg | Freq: Once | INTRAMUSCULAR | Status: AC
  Administered 2013-11-05: 4 mg via INTRAVENOUS
  Filled 2013-11-05: qty 2

## 2013-11-05 MED ORDER — HYDROMORPHONE HCL PF 1 MG/ML IJ SOLN
1.0000 mg | Freq: Once | INTRAMUSCULAR | Status: AC
  Administered 2013-11-05: 1 mg via INTRAVENOUS
  Filled 2013-11-05: qty 1

## 2013-11-05 MED ORDER — SODIUM CHLORIDE 0.9 % IV BOLUS (SEPSIS)
1000.0000 mL | Freq: Once | INTRAVENOUS | Status: AC
  Administered 2013-11-05: 1000 mL via INTRAVENOUS

## 2013-11-05 NOTE — ED Notes (Signed)
Patient arrive via EMS from home with c/o n/v/d that started at 0500 today. Last emesis at 0900 per patient. Alert/oriented x 4.

## 2013-11-05 NOTE — Telephone Encounter (Signed)
Jamie Harding in the am, Jamie Harding next Tues am or Weds am

## 2013-11-05 NOTE — Telephone Encounter (Signed)
Pt went to ED today via ambulance for abd pain, n/v/d... She states the doc said she needs to follow up either Friday  Or Monday,  No appts available. They also want to bring [facility] in for a visit for his back pain at the same time. We can just put them in the same room she says. If we have to wait on Remigio Eisenmenger then so be it, but please can she be seen. Please advise

## 2013-11-05 NOTE — ED Provider Notes (Signed)
CSN: [id number]     Arrival date & time 11/05/13  1004 History   First MD Initiated Contact with Patient 11/05/13 1011     Chief Complaint  Patient presents with  . Emesis   (Consider location/radiation/quality/duration/timing/severity/associated sxs/prior Treatment) Patient is a 57 y.o. female presenting with vomiting.  Emesis  Pt with history of multiple medical problems including DM and gastroparesis reports she woke up this AM with moderate aching epigastric pain, associated with multiple episodes of nonbloody emesis and then watery diarrhea. No fever. No particular provoking or relieving factors.  Past Medical History  Diagnosis Date  . DM (diabetes mellitus)   . HTN (hypertension)   . Obese   . Depression   . Anxiety   . HLD (hyperlipidemia)   . Hiatal hernia   . Asthma   . GERD (gastroesophageal reflux disease)   . Fatty liver   . Gastritis   . Pancreatitis   . Tibialis tendinitis   . Chest pain     Nuclear  February, 2012,,  breast attenuation, no definite scar or ischemia  . Gastroparesis     NEGATIVE GES 2010  . Rheumatoid arthritis(714.0)   . Stroke 2008    no deficits  . Neuropathy   . Ejection fraction     EF 60%, echo, February, 2012, mild LVH  . Family history of anesthesia complication     post anesthesia N/V   Past Surgical History  Procedure Laterality Date  . Cesarean section    . Cholecystectomy    . Ankle surgery      remove extra bone-left  . Finger surgery      left little finger  . Lithotripsy    . Dilation and curettage of uterus      multiple  . Laser ablation of the cervix    . Carpal tunnel release      right side  . Tubal ligation    . Gastric emptying  07/27/2009    The amount of activity in the stomach at 120 minutes was 13% which is in the normal range  . Esophagogastroduodenoscopy  07/29/2009    Mild gastritis, benign path  . Colonoscopy with propofol  09/16/2012    Procedure: COLONOSCOPY WITH PROPOFOL;  Surgeon: West Bali, MD;  Location: [facility];  Service: Endoscopy;  Laterality: N/A;  in cecum at 0812, end at 0831 ; total withdrawal time 19 minutes  . Esophagogastroduodenoscopy (egd) with propofol  09/16/2012    Procedure: ESOPHAGOGASTRODUODENOSCOPY (EGD) WITH PROPOFOL;  Surgeon: West Bali, MD;  Location: [facility];  Service: Endoscopy;  Laterality: N/A;  start at [phone]  . Savory dilation  09/16/2012    Procedure: SAVORY DILATION;  Surgeon: West Bali, MD;  Location: [facility];  Service: Endoscopy;  Laterality: N/A;  16 fr dilation  . Polypectomy  09/16/2012    Procedure: POLYPECTOMY;  Surgeon: West Bali, MD;  Location: [facility];  Service: Endoscopy;  Laterality: N/A;  . Carpal tunnel release Left 02/06/2013    Procedure: CARPAL TUNNEL RELEASE ;  Surgeon: Vickki Hearing, MD;  Location: [facility];  Service: Orthopedics;  Laterality: Left;  procedure end 1135  . Trigger finger release Left 02/06/2013    Procedure: RELEASE TRIGGER FINGER LEFT LONG FINGER/A-1 PULLEY;  Surgeon: Vickki Hearing, MD;  Location: [facility];  Service: Orthopedics;  Laterality: Left;  procedure began 1136   Family History  Problem Relation Age of Onset  . Breast cancer Mother   .  Diabetes Father   . Coronary artery disease    . Arthritis    . Asthma    . Colon cancer Neg Hx   . Diabetes Sister    History  Substance Use Topics  . Smoking status: Former Smoker -- 0.50 packs/day for 3 years    Types: Cigarettes    Quit date: 09/11/1987  . Smokeless tobacco: Not on file  . Alcohol Use: No   OB History   Grav Para Term Preterm Abortions TAB SAB Ect Mult Living                 Review of Systems  Gastrointestinal: Positive for vomiting.   All other systems reviewed and are negative except as noted in HPI.   Allergies  Byetta 10 mcg pen; Naproxen; Azithromycin; Erythromycin; Morphine; and Sulfonamide derivatives  Home Medications   Current Outpatient Rx  Name  Route  Sig  Dispense  Refill  . albuterol (PROVENTIL  HFA;VENTOLIN HFA) 108 (90 BASE) MCG/ACT inhaler   Inhalation   Inhale 2 puffs into the lungs every 4 (four) hours as needed for wheezing.   3 Inhaler   0   . ALPRAZolam (XANAX) 0.5 MG tablet   Oral   Take 1 tablet (0.5 mg total) by mouth 2 (two) times daily as needed. For anxiety   60 tablet   0     Needs office visit   . amLODipine (NORVASC) 10 MG tablet   Oral   Take 1 tablet (10 mg total) by mouth daily.   30 tablet   11   . aspirin EC 81 MG tablet   Oral   Take 81 mg by mouth daily.         . Canagliflozin (INVOKANA) 300 MG TABS   Oral   Take 300 mg by mouth daily.   90 tablet   3     New dose   . cetirizine (ZYRTEC) 10 MG tablet   Oral   Take 10 mg by mouth daily.         . cyanocobalamin 1000 MCG tablet   Oral   Take 1,000 mcg by mouth daily.          . fluticasone (FLONASE) 50 MCG/ACT nasal spray   Nasal   Place 2 sprays into the nose daily.         . furosemide (LASIX) 20 MG tablet   Oral   Take 20 mg by mouth daily.           Marland Kitchen gabapentin (NEURONTIN) 300 MG capsule   Oral   Take 300 mg by mouth 3 (three) times daily.          Marland Kitchen glucose blood test strip      Use as instructed   100 each   12   . HYDROcodone-acetaminophen (NORCO/VICODIN) 5-325 MG per tablet   Oral   Take 1 tablet by mouth every 4 (four) hours as needed for pain.   120 tablet   0   . insulin aspart (NOVOLOG) 100 UNIT/ML injection   Subcutaneous   Inject 0-15 Units into the skin. SLIDING SCALE         . insulin glargine (LANTUS) 100 UNIT/ML injection   Subcutaneous   Inject 90 Units into the skin at bedtime.          Marland Kitchen lisinopril (PRINIVIL,ZESTRIL) 20 MG tablet   Oral   Take 20 mg by mouth daily.           Marland Kitchen  metFORMIN (GLUCOPHAGE) 1000 MG tablet   Oral   Take 1,000 mg by mouth 2 (two) times daily with a meal.          . methocarbamol (ROBAXIN) 500 MG tablet   Oral   Take 500 mg by mouth 2 (two) times daily. For muscle spasms         .  Multiple Vitamin (MULTIVITAMIN WITH MINERALS) TABS   Oral   Take 1 tablet by mouth daily.         . ondansetron (ZOFRAN) 8 MG tablet   Oral   Take 8 mg by mouth every 12 (twelve) hours as needed. For nausea         . oxyCODONE (OXY IR/ROXICODONE) 5 MG immediate release tablet   Oral   Take 5 mg by mouth every 4 (four) hours as needed for pain.         . pantoprazole (PROTONIX) 40 MG tablet   Oral   Take 1 tablet (40 mg total) by mouth daily.   90 tablet   2     In place of nexium   . pravastatin (PRAVACHOL) 80 MG tablet   Oral   Take 80 mg by mouth daily.          . promethazine (PHENERGAN) 25 MG tablet   Oral   Take 1 tablet (25 mg total) by mouth every 8 (eight) hours as needed for nausea.   30 tablet   3   . pyridOXINE (VITAMIN B-6) 100 MG tablet   Oral   Take 100 mg by mouth daily.         Marland Kitchen venlafaxine (EFFEXOR) 75 MG tablet   Oral   Take 75 mg by mouth 2 (two) times daily.           BP 128/79  Pulse 88  Resp 19  Ht 5\' 2"  (1.575 m)  Wt 220 lb (99.791 kg)  BMI 40.23 kg/m2  SpO2 99% Physical Exam  Nursing note and vitals reviewed. Constitutional: She is oriented to person, place, and time. She appears well-developed and well-nourished.  HENT:  Head: Normocephalic and atraumatic.  Eyes: EOM are normal. Pupils are equal, round, and reactive to light.  Neck: Normal range of motion. Neck supple.  Cardiovascular: Normal rate, normal heart sounds and intact distal pulses.   Pulmonary/Chest: Effort normal and breath sounds normal.  Abdominal: Bowel sounds are normal. She exhibits no distension. There is tenderness (epigastric mild). There is no rebound and no guarding.  Musculoskeletal: Normal range of motion. She exhibits no edema and no tenderness.  Neurological: She is alert and oriented to person, place, and time. She has normal strength. No cranial nerve deficit or sensory deficit.  Skin: Skin is warm and dry. No rash noted.  Psychiatric: She  has a normal mood and affect.    ED Course  Procedures (including critical care time) Labs Review Labs Reviewed  CBC WITH DIFFERENTIAL - Abnormal; Notable for the following:    WBC 17.7 (*)    RBC 5.45 (*)    Hemoglobin 15.8 (*)    HCT 47.1 (*)    Neutrophils Relative % 90 (*)    Neutro Abs 15.9 (*)    Lymphocytes Relative 5 (*)    All other components within normal limits  COMPREHENSIVE METABOLIC PANEL - Abnormal; Notable for the following:    Glucose, Bld 143 (*)    All other components within normal limits  URINALYSIS, ROUTINE W REFLEX MICROSCOPIC - Abnormal;  Notable for the following:    Glucose, UA >1000 (*)    Ketones, ur 15 (*)    All other components within normal limits  LIPASE, BLOOD  URINE MICROSCOPIC-ADD ON   Imaging Review No results found.  EKG Interpretation   None       MDM   1. Gastroparesis   2. Abdominal pain      Labs reviewed, leukocytosis but otherwise normal. Pt feeling better, tolerating PO ready to go home.     Charles B. Bernette Mayers, MD 11/05/13 1407

## 2013-11-05 NOTE — ED Notes (Signed)
Attempted phlebotomy draw. Unsuccessful.

## 2013-11-05 NOTE — Telephone Encounter (Signed)
Discussed with Anacristina. She made an appt for Friday and appt for [facility] next week.

## 2013-11-06 ENCOUNTER — Ambulatory Visit (INDEPENDENT_AMBULATORY_CARE_PROVIDER_SITE_OTHER): Payer: 59 | Admitting: Family Medicine

## 2013-11-06 ENCOUNTER — Encounter: Payer: Self-pay | Admitting: Family Medicine

## 2013-11-06 VITALS — BP 142/80 | Ht 62.0 in | Wt 220.6 lb

## 2013-11-06 DIAGNOSIS — D72829 Elevated white blood cell count, unspecified: Secondary | ICD-10-CM

## 2013-11-06 DIAGNOSIS — K3184 Gastroparesis: Secondary | ICD-10-CM

## 2013-11-06 LAB — CBC
HCT: 41.9 % (ref 36.0–46.0)
Hemoglobin: 14.5 g/dL (ref 12.0–15.0)
MCV: 83 fL (ref 78.0–100.0)
Platelets: 303 10*3/uL (ref 150–400)
RBC: 5.05 MIL/uL (ref 3.87–5.11)
RDW: 13.6 % (ref 11.5–15.5)
WBC: 9.2 10*3/uL (ref 4.0–10.5)

## 2013-11-06 NOTE — Progress Notes (Signed)
   Subjective:    Patient ID: Jamie Harding, female    DOB: 1956-08-21, 57 y.o.   MRN: [id number]  HPI Patient is here today for a f/u visit from the ER yesterday due to gastroparesis. ER notes were reviewed in detail. Previous labs current labs plus also previous testing was reviewed in detail plus also previous notes from gastroenterology was reviewed in detail. This patient has chronic gastroparesis. It causes intermittent flareups. She describes no having a combination of vomiting and diarrhea over the past couple days that makes one think there is a possibility there could have been a viral illness on top of the gastroparesis. Unfortunately she missed work because of this.  She went to the ER due to N/V and abdominal pain. They gave her fluids b/c she was dehydrated.  She wants to know what else she can do. She is very frustrated about what's going on she would like to try other measures. We talked at length about gastroparesis and how this is a chronic condition often linked to diabetes and it's not going to be cured. We talked about small meals we also talked about watching portions plus also taking medications. She does use Zofran and Phenergan for this.  Past medical history family history social history review   Her white blood count was elevated in the ER we will need to repeat this  Review of Systems See above denies fevers denies joint pain sweats no weight loss    Objective:   Physical Exam Lungs are clear no crackles heart is regular pulse normal abdomen is soft with moderate epigastric tenderness no guarding or rebound extremities no edema skin warm dry       Assessment & Plan:  Elevated white blood count-we will repeat CBC and see what that shows  Gastroparesis the best approach would be trying Reglan but it interferes with her Effexor with increased risk of serotonin syndrome so therefore we will hold off on Reglan currently I recommend we refer her. I have full  confidence in Dr. Karilyn Cota, he has used domperidone in the past for this condition.  I do believe the patient will still face intermittent spells of this could just as a small meals frequently. Zofran Phenergan when necessary. 25 minutes spent with patient.

## 2013-11-16 ENCOUNTER — Encounter: Payer: Self-pay | Admitting: Gastroenterology

## 2013-11-20 ENCOUNTER — Other Ambulatory Visit: Payer: Self-pay | Admitting: Urology

## 2013-11-20 ENCOUNTER — Ambulatory Visit (HOSPITAL_COMMUNITY)
Admission: RE | Admit: 2013-11-20 | Discharge: 2013-11-20 | Disposition: A | Discharge status: Home / Self Care | Payer: 59 | Source: Ambulatory Visit | Attending: Urology | Admitting: Urology

## 2013-11-20 ENCOUNTER — Ambulatory Visit (INDEPENDENT_AMBULATORY_CARE_PROVIDER_SITE_OTHER): Payer: 59 | Admitting: Urology

## 2013-11-20 DIAGNOSIS — N200 Calculus of kidney: Secondary | ICD-10-CM

## 2013-11-20 DIAGNOSIS — R310 Gross hematuria: Secondary | ICD-10-CM

## 2013-11-24 ENCOUNTER — Encounter: Payer: Self-pay | Admitting: Family Medicine

## 2013-11-25 ENCOUNTER — Encounter: Payer: Self-pay | Admitting: Family Medicine

## 2013-11-25 ENCOUNTER — Ambulatory Visit (INDEPENDENT_AMBULATORY_CARE_PROVIDER_SITE_OTHER): Payer: 59 | Admitting: Family Medicine

## 2013-11-25 VITALS — BP 144/84 | Temp 98.5°F | Ht 62.0 in | Wt 226.0 lb

## 2013-11-25 DIAGNOSIS — L03119 Cellulitis of unspecified part of limb: Principal | ICD-10-CM

## 2013-11-25 DIAGNOSIS — L02419 Cutaneous abscess of limb, unspecified: Secondary | ICD-10-CM

## 2013-11-25 MED ORDER — CLINDAMYCIN HCL 300 MG PO CAPS: 300.0000 mg | ORAL_CAPSULE | Freq: Three times a day (TID) | ORAL | Status: DC

## 2013-11-25 MED ORDER — MUPIROCIN 2 % EX OINT: TOPICAL_OINTMENT | CUTANEOUS | Status: AC

## 2013-11-25 MED ORDER — DOXYCYCLINE HYCLATE 100 MG PO CAPS: 100.0000 mg | ORAL_CAPSULE | Freq: Two times a day (BID) | ORAL | Status: DC

## 2013-11-25 NOTE — Progress Notes (Signed)
   Subjective:    Patient ID: Jamie Harding, female    DOB: 07/10/1956, 58 y.o.   MRN: [id number]  HPI Martin Majestic to hospital 3 weeks ago and the straps on the stretcher cut right leg. Sore on leg, painful. Using iodine, triple antibiotic ointment, cream for staff infection, alcholol, and peroxide.  Patient is a diabetic which makes this more tricky. She states her sugars been doing good recently. Denies high fever chills sweats. No drainage from this spot.   Review of Systems See above.    Objective:   Physical Exam This area has a central region where the skin is gone it approximately 4 mm in diameter. Circular aspect. Has area of cellulitis around it no abscess. Very tender to the touch       Assessment & Plan:  Infection of the leg with some skin loss cellulitis-culture taken-antibiotics prescribed. Warnings discussed. Should gradually get better but being a diabetic she needs to keep close eye on it. She is on doxycycline already I have added clindamycin 3 times a day for the next 7-10 days along with warning signs which were discussed. In addition to this she was told to use a probiotic if she has any problems with her diabetes she is to let us know

## 2013-11-28 LAB — WOUND CULTURE
GRAM STAIN: NONE SEEN
Gram Stain: NONE SEEN
Gram Stain: NONE SEEN
ORGANISM ID, BACTERIA: NO GROWTH

## 2013-11-29 ENCOUNTER — Encounter: Payer: Self-pay | Admitting: Family Medicine

## 2013-11-30 ENCOUNTER — Telehealth: Payer: Self-pay | Admitting: *Deleted

## 2013-11-30 NOTE — Telephone Encounter (Signed)
Discussed with patient. appt made for tomorrow for recheck on leg

## 2013-11-30 NOTE — Telephone Encounter (Signed)
Recheck is reasonable, may need referal to wound center being  Diabetic,should be using bactroban on the wound also

## 2013-11-30 NOTE — Telephone Encounter (Signed)
Called pt with results of culture. She stated her leg is not worse but not any better either. She stated it was less red but it is swollen and it is draining. She is taking doxy  BID and bactroban TID. She took clindamycin for wed, thur, fri and sat but stopped because it was causing nausea. Does she need to come back in for a recheck or can something different be called into [facility].

## 2013-12-01 ENCOUNTER — Encounter: Payer: Self-pay | Admitting: Family Medicine

## 2013-12-01 ENCOUNTER — Ambulatory Visit (INDEPENDENT_AMBULATORY_CARE_PROVIDER_SITE_OTHER): Payer: 59 | Admitting: Family Medicine

## 2013-12-01 VITALS — BP 122/74 | Ht 62.0 in | Wt 226.0 lb

## 2013-12-01 DIAGNOSIS — S81809A Unspecified open wound, unspecified lower leg, initial encounter: Principal | ICD-10-CM

## 2013-12-01 DIAGNOSIS — S81009A Unspecified open wound, unspecified knee, initial encounter: Secondary | ICD-10-CM

## 2013-12-01 DIAGNOSIS — S91009A Unspecified open wound, unspecified ankle, initial encounter: Principal | ICD-10-CM

## 2013-12-01 NOTE — Progress Notes (Signed)
   Subjective:    Patient ID: Jamie Harding, female    DOB: [birth year], 58 y.o.   MRN: [id number]  HPI Patient is here today for a recheck on an abscess on her right leg. She was seen here on 1/7.   She states it is not getting worst. The swelling has gone down. She quit taking the clindamycin on Sat b/c it upset her stomach.  Patient has had dysuria for several weeks since he got injured when going to the hospital in EMS. She's tried antibiotics but it did not help it heal   Review of Systems Patient without fever she does relate some soreness around the area on the legs she denies any new injury. She also denies any pus drainage from it.    Objective:   Physical Exam Calf nontender she has a reddened area approximately an inch in diameter with an appearing area of central skin loss with scab approximately three-eighths of an inch in diameter. No pus drainage on pressing it       Assessment & Plan:  Has wound it that needs wound center treatment. Patient diabetic not healing well with this I do not feel further antibiotics necessary currently I doubt osteomyelitis.

## 2013-12-04 ENCOUNTER — Telehealth: Payer: Self-pay | Admitting: Family Medicine

## 2013-12-04 NOTE — Telephone Encounter (Signed)
Patient calling to find out if Dr Nicki Reaper ever made appointment for her for the wound care center.

## 2013-12-04 NOTE — Telephone Encounter (Signed)
This is in the process. The referral was turned over to [facility]. Is not instantaneous.

## 2013-12-07 ENCOUNTER — Encounter: Payer: Self-pay | Admitting: Family Medicine

## 2013-12-07 NOTE — Telephone Encounter (Signed)
Patient notified that we will call her with appt.

## 2013-12-10 ENCOUNTER — Ambulatory Visit: Payer: 59 | Admitting: Gastroenterology

## 2013-12-10 ENCOUNTER — Telehealth: Payer: Self-pay | Admitting: Gastroenterology

## 2013-12-10 ENCOUNTER — Encounter: Payer: Self-pay | Admitting: Gastroenterology

## 2013-12-10 NOTE — Telephone Encounter (Signed)
Pt was a no show

## 2013-12-10 NOTE — Telephone Encounter (Signed)
Mailed letter and PCP is aware °

## 2013-12-15 ENCOUNTER — Encounter: Payer: Self-pay | Admitting: Family Medicine

## 2013-12-15 ENCOUNTER — Ambulatory Visit (INDEPENDENT_AMBULATORY_CARE_PROVIDER_SITE_OTHER): Payer: 59 | Admitting: Family Medicine

## 2013-12-15 VITALS — BP 146/86 | Ht 62.0 in | Wt 228.4 lb

## 2013-12-15 DIAGNOSIS — F411 Generalized anxiety disorder: Secondary | ICD-10-CM

## 2013-12-15 DIAGNOSIS — M79605 Pain in left leg: Secondary | ICD-10-CM

## 2013-12-15 DIAGNOSIS — M545 Low back pain, unspecified: Secondary | ICD-10-CM

## 2013-12-15 DIAGNOSIS — E119 Type 2 diabetes mellitus without complications: Secondary | ICD-10-CM

## 2013-12-15 DIAGNOSIS — K3184 Gastroparesis: Secondary | ICD-10-CM

## 2013-12-15 DIAGNOSIS — E785 Hyperlipidemia, unspecified: Secondary | ICD-10-CM

## 2013-12-15 DIAGNOSIS — I10 Essential (primary) hypertension: Secondary | ICD-10-CM

## 2013-12-15 LAB — POCT GLYCOSYLATED HEMOGLOBIN (HGB A1C): Hemoglobin A1C: 6.5

## 2013-12-15 MED ORDER — METHOCARBAMOL 500 MG PO TABS: 500.0000 mg | ORAL_TABLET | Freq: Two times a day (BID) | ORAL | Status: DC

## 2013-12-15 MED ORDER — CETIRIZINE HCL 10 MG PO TABS: 10.0000 mg | ORAL_TABLET | Freq: Every day | ORAL | Status: DC

## 2013-12-15 MED ORDER — HYDROCODONE-ACETAMINOPHEN 5-325 MG PO TABS: 1.0000 | ORAL_TABLET | Freq: Four times a day (QID) | ORAL | Status: DC | PRN

## 2013-12-15 MED ORDER — PROMETHAZINE HCL 25 MG PO TABS: 25.0000 mg | ORAL_TABLET | Freq: Three times a day (TID) | ORAL | Status: DC | PRN

## 2013-12-15 MED ORDER — VENLAFAXINE HCL 75 MG PO TABS: 75.0000 mg | ORAL_TABLET | Freq: Two times a day (BID) | ORAL | Status: DC

## 2013-12-15 MED ORDER — PANTOPRAZOLE SODIUM 40 MG PO TBEC: 40.0000 mg | DELAYED_RELEASE_TABLET | Freq: Two times a day (BID) | ORAL | Status: DC

## 2013-12-15 MED ORDER — METFORMIN HCL 1000 MG PO TABS: 1000.0000 mg | ORAL_TABLET | Freq: Two times a day (BID) | ORAL | Status: DC

## 2013-12-15 MED ORDER — ONDANSETRON HCL 8 MG PO TABS: 8.0000 mg | ORAL_TABLET | Freq: Two times a day (BID) | ORAL | Status: DC | PRN

## 2013-12-15 MED ORDER — FUROSEMIDE 20 MG PO TABS: 20.0000 mg | ORAL_TABLET | Freq: Every day | ORAL | Status: DC

## 2013-12-15 MED ORDER — INSULIN GLARGINE 100 UNIT/ML ~~LOC~~ SOLN: 90.0000 [IU] | Freq: Every day | SUBCUTANEOUS | Status: DC

## 2013-12-15 MED ORDER — PRAVASTATIN SODIUM 80 MG PO TABS: 80.0000 mg | ORAL_TABLET | Freq: Every day | ORAL | Status: DC

## 2013-12-15 MED ORDER — ALPRAZOLAM 0.5 MG PO TABS: 0.5000 mg | ORAL_TABLET | Freq: Two times a day (BID) | ORAL | Status: DC | PRN

## 2013-12-15 MED ORDER — FLUTICASONE PROPIONATE 50 MCG/ACT NA SUSP: 2.0000 | Freq: Every day | NASAL | Status: DC

## 2013-12-15 MED ORDER — ALBUTEROL SULFATE HFA 108 (90 BASE) MCG/ACT IN AERS: 2.0000 | INHALATION_SPRAY | RESPIRATORY_TRACT | Status: DC | PRN

## 2013-12-15 MED ORDER — LISINOPRIL 20 MG PO TABS: 20.0000 mg | ORAL_TABLET | Freq: Every day | ORAL | Status: DC

## 2013-12-15 NOTE — Progress Notes (Signed)
Subjective:    Patient ID: Jamie Harding, female    DOB: 05/31/1956, 58 y.o.   MRN: [id number]  Diabetes She presents for her follow-up diabetic visit. She has type 2 diabetes mellitus. Her disease course has been improving. There are no hypoglycemic associated symptoms. Pertinent negatives for hypoglycemia include no confusion. Pertinent negatives for diabetes include no blurred vision, no chest pain, no fatigue, no polydipsia, no polyphagia and no weakness. Symptoms are stable. Risk factors for coronary artery disease include diabetes mellitus, dyslipidemia, family history, obesity and hypertension. Current diabetic treatment includes insulin injections and oral agent (dual therapy). She is compliant with treatment all of the time. Her weight is stable. She is following a diabetic diet. There is no change in her home blood glucose trend.   Patient arrives for a follow up on diabetes The patient was seen today as part of a comprehensive diabetic check up. The patient had the following elements completed: -Review of medication compliance -Review of glucose monitoring results -Review of any complications do to high or low sugars -Diabetic foot exam was completed as part of today's visit. The following was also discussed: -Importance of yearly eye exams -Importance of following diabetic/low sugar-starch diet -Importance of exercise and regular activity -Importance of regular followup visits. -Most recent hemoglobin A1c were reviewed with the patient along with goals regarding diabetes.  This patient was seen today for chronic pain  The medication list was reviewed and updated.  Discussion was held with the patient regarding compliance with pain medication. The patient was advised the importance of maintaining medication and not using illegal substances with these. The patient was educated that we can provide 3 monthly scripts for their medication, it is their responsibility to follow the  instructions. Discussion was held with the patient to make sure they're not having significant side effects. Patient is aware that pain medications are meant to minimize the severity of the pain to allow their pain levels to improve to allow for better function. They are aware of that pain medications cannot totally remove their pain.  She relates that she is no longer having the regurgitation vomiting issues that she was having a few weeks ago she now tends to eat small meals and cuts it up into small pieces before eating and that has helped greatly.     Review of Systems  Constitutional: Negative for activity change, appetite change and fatigue.  Eyes: Negative for blurred vision.  Cardiovascular: Negative for chest pain.  Endocrine: Negative for polydipsia and polyphagia.  Genitourinary: Negative for frequency.  Neurological: Negative for weakness.  Psychiatric/Behavioral: Negative for confusion.       Objective:   Physical Exam  Constitutional: She is oriented to person, place, and time. She appears well-developed and well-nourished.  HENT:  Head: Normocephalic.  Right Ear: External ear normal.  Left Ear: External ear normal.  Eyes: Pupils are equal, round, and reactive to light.  Neck: Normal range of motion. No thyromegaly present.  Cardiovascular: Normal rate, regular rhythm, normal heart sounds and intact distal pulses.   No murmur heard. Pulmonary/Chest: Effort normal and breath sounds normal. No respiratory distress. She has no wheezes.  Abdominal: Soft. Bowel sounds are normal. She exhibits no distension and no mass. There is no tenderness.  Musculoskeletal: Normal range of motion. She exhibits no edema and no tenderness.  Lymphadenopathy:    She has no cervical adenopathy.  Neurological: She is alert and oriented to person, place, and time. She exhibits normal muscle  tone.  Skin: Skin is warm and dry.  Psychiatric: She has a normal mood and affect. Her behavior is  normal.          Assessment & Plan:  Viral URI-this is a viral process given another few days should gradually get better and go away if not getting better then notify us and we will call in an antibiotic  DM-am very impressed with how she is doing she needs to continue her dietary measures refills were given. A1c looks very good. Continue current measures.  Hyperlip-fairly good control could be better but she did not tolerate other medications are therefore stick with current medication watch diet closely and stay physically active  Chrionic pain-she has chronic pain discomfort in her lower back and in her knees 3 refills given on her hydrocodone she needs followup in 3 months time she knows not to abuse his medication. It does help her function better she will continue as written.  R arm sore-she has a sore on the right arm but no apparent type of secondary infection she is in topical antibiotic ointment this should help get doing better if it doesn't get better over the next couple weeks notify us

## 2013-12-17 ENCOUNTER — Other Ambulatory Visit: Payer: Self-pay | Admitting: Family Medicine

## 2013-12-21 ENCOUNTER — Encounter (HOSPITAL_BASED_OUTPATIENT_CLINIC_OR_DEPARTMENT_OTHER): Payer: 59 | Attending: Plastic Surgery

## 2013-12-21 DIAGNOSIS — I10 Essential (primary) hypertension: Secondary | ICD-10-CM | POA: Insufficient documentation

## 2013-12-21 DIAGNOSIS — Z7982 Long term (current) use of aspirin: Secondary | ICD-10-CM | POA: Insufficient documentation

## 2013-12-21 DIAGNOSIS — E119 Type 2 diabetes mellitus without complications: Secondary | ICD-10-CM | POA: Insufficient documentation

## 2013-12-21 DIAGNOSIS — Z79899 Other long term (current) drug therapy: Secondary | ICD-10-CM | POA: Insufficient documentation

## 2013-12-21 DIAGNOSIS — I872 Venous insufficiency (chronic) (peripheral): Secondary | ICD-10-CM | POA: Insufficient documentation

## 2013-12-21 DIAGNOSIS — Z794 Long term (current) use of insulin: Secondary | ICD-10-CM | POA: Insufficient documentation

## 2013-12-21 DIAGNOSIS — E669 Obesity, unspecified: Secondary | ICD-10-CM | POA: Insufficient documentation

## 2013-12-21 DIAGNOSIS — L97809 Non-pressure chronic ulcer of other part of unspecified lower leg with unspecified severity: Secondary | ICD-10-CM | POA: Insufficient documentation

## 2013-12-21 DIAGNOSIS — Z87442 Personal history of urinary calculi: Secondary | ICD-10-CM | POA: Insufficient documentation

## 2013-12-21 LAB — GLUCOSE, CAPILLARY: Glucose-Capillary: 141 mg/dL — ABNORMAL HIGH (ref 70–99)

## 2013-12-22 NOTE — Progress Notes (Signed)
[facility]  NAME:  Jamie Harding, Jamie Harding NO.:  [id number]  MEDICAL RECORD NO.:  [id number]      DATE OF BIRTH:  Apr 28, 1956  PHYSICIAN:  Judene Companion, M.D.           VISIT DATE:                                  OFFICE VISIT   This is a 58 year old type 2 diabetic who comes to [name] with a nonhealing ulcer on the anterior aspect of her right leg.  It is about an inch in diameter, and it probably was brought on by a traumatic bruise to the anterior aspect of her leg.  She does have venous hypertension and I would say this is more in line with a venous ulcer with inflammation and I am going to treat it with Unna boot, and silver collagen.  She has got very good pulses.  Her diabetes is in excellent shape.  She had a blood pressure of 130/80, pulse 63, temperature 98.  She is obese at 221 pounds.  Her blood sugar was 140 today.  The patient takes many medicines including Norvasc, Proventil, aspirin, Zyrtec, vitamin B12, Flonase, Lasix, Neurontin, Norco, NovoLog insulin, Lantus insulin, Prinivil, Glucophage, and vitamins.  She has had many surgeries including C-sections, kidney stones, ankle surgery, and her gallbladder removed.  I examined this it was very clean wound and I am going to treat it with collagen and Unna boot, and we will see her back in a week and I trust it will be healed up.  So, her diagnosis is venous ulcer, right leg are related to trauma and other diagnoses are diabetes, hypertension, history of kidney stones, and obesity.  I will see her back here in a week.     Judene Companion, M.D.     PP/MEDQ  D:  12/21/2013  T:  12/22/2013  Job:  [id number]

## 2013-12-27 ENCOUNTER — Encounter: Payer: Self-pay | Admitting: Family Medicine

## 2014-01-23 ENCOUNTER — Ambulatory Visit: Payer: 59

## 2014-02-04 ENCOUNTER — Other Ambulatory Visit: Payer: Self-pay | Admitting: Nurse Practitioner

## 2014-02-09 ENCOUNTER — Encounter: Payer: Self-pay | Admitting: Family Medicine

## 2014-02-11 DIAGNOSIS — Z0289 Encounter for other administrative examinations: Secondary | ICD-10-CM

## 2014-02-16 ENCOUNTER — Ambulatory Visit: Payer: 59 | Admitting: Dietician

## 2014-03-09 ENCOUNTER — Encounter: Payer: Self-pay | Admitting: Family Medicine

## 2014-03-09 ENCOUNTER — Ambulatory Visit (INDEPENDENT_AMBULATORY_CARE_PROVIDER_SITE_OTHER): Payer: 59 | Admitting: Family Medicine

## 2014-03-09 VITALS — BP 142/82 | Temp 98.5°F | Ht 62.0 in | Wt 222.6 lb

## 2014-03-09 DIAGNOSIS — J019 Acute sinusitis, unspecified: Secondary | ICD-10-CM

## 2014-03-09 DIAGNOSIS — R509 Fever, unspecified: Secondary | ICD-10-CM

## 2014-03-09 MED ORDER — HYDROCODONE-HOMATROPINE 5-1.5 MG/5ML PO SYRP: 5.0000 mL | ORAL_SOLUTION | Freq: Three times a day (TID) | ORAL | Status: DC | PRN

## 2014-03-09 MED ORDER — CEFTRIAXONE SODIUM 1 G IJ SOLR
500.0000 mg | Freq: Once | INTRAMUSCULAR | Status: AC
  Administered 2014-03-09: 500 mg via INTRAMUSCULAR

## 2014-03-09 MED ORDER — AMOXICILLIN-POT CLAVULANATE 875-125 MG PO TABS: 1.0000 | ORAL_TABLET | Freq: Two times a day (BID) | ORAL | Status: DC

## 2014-03-09 NOTE — Progress Notes (Signed)
   Subjective:    Patient ID: Jamie Harding, female    DOB: 04-08-1956, 58 y.o.   MRN: [id number]  Cough This is a new problem. The current episode started in the past 7 days. Associated symptoms include a fever, headaches, myalgias and nasal congestion. Treatments tried: benadryl, flonase, zyrtec.   PMH diabetes   Review of Systems  Constitutional: Positive for fever.  Respiratory: Positive for cough.   Musculoskeletal: Positive for myalgias.  Neurological: Positive for headaches.       Objective:   Physical Exam Mild sinus tenderness neck supple eardrums normal lungs clear heart regular skin warm dry       Assessment & Plan:  Acute sinusitis antibiotics prescribed cough medication for nighttime don't take with hydrocodone also plenty of rest recommended

## 2014-03-16 ENCOUNTER — Ambulatory Visit (INDEPENDENT_AMBULATORY_CARE_PROVIDER_SITE_OTHER): Payer: 59 | Admitting: Family Medicine

## 2014-03-16 ENCOUNTER — Encounter: Payer: Self-pay | Admitting: Family Medicine

## 2014-03-16 VITALS — BP 120/72 | Ht 62.0 in | Wt 229.0 lb

## 2014-03-16 DIAGNOSIS — Z79899 Other long term (current) drug therapy: Secondary | ICD-10-CM

## 2014-03-16 DIAGNOSIS — M79605 Pain in left leg: Secondary | ICD-10-CM

## 2014-03-16 DIAGNOSIS — M545 Low back pain, unspecified: Secondary | ICD-10-CM

## 2014-03-16 DIAGNOSIS — E119 Type 2 diabetes mellitus without complications: Secondary | ICD-10-CM

## 2014-03-16 DIAGNOSIS — I10 Essential (primary) hypertension: Secondary | ICD-10-CM

## 2014-03-16 DIAGNOSIS — E785 Hyperlipidemia, unspecified: Secondary | ICD-10-CM

## 2014-03-16 LAB — POCT GLYCOSYLATED HEMOGLOBIN (HGB A1C): Hemoglobin A1C: 7.2

## 2014-03-16 MED ORDER — HYDROCODONE-ACETAMINOPHEN 5-325 MG PO TABS: 1.0000 | ORAL_TABLET | Freq: Four times a day (QID) | ORAL | Status: DC | PRN

## 2014-03-16 MED ORDER — PANTOPRAZOLE SODIUM 40 MG PO TBEC: 40.0000 mg | DELAYED_RELEASE_TABLET | Freq: Two times a day (BID) | ORAL | Status: DC

## 2014-03-16 MED ORDER — VENLAFAXINE HCL 75 MG PO TABS: 75.0000 mg | ORAL_TABLET | Freq: Two times a day (BID) | ORAL | Status: DC

## 2014-03-16 MED ORDER — FLUCONAZOLE 150 MG PO TABS: ORAL_TABLET | ORAL | Status: DC

## 2014-03-16 NOTE — Progress Notes (Signed)
   Subjective:    Patient ID: Jamie Harding, female    DOB: 1956-08-07, 58 y.o.   MRN: [id number]  Diabetes She presents for her follow-up diabetic visit. She has type 2 diabetes mellitus. Pertinent negatives for hypoglycemia include no confusion. Pertinent negatives for diabetes include no chest pain, no fatigue, no polydipsia, no polyphagia and no weakness. She is compliant with treatment all of the time. She never participates in exercise. Her lunch blood glucose range is generally 110-130 mg/dl. She does not see a podiatrist.Eye exam is current.   Patient does have chronic pain and discomfort. She does take medicine on regular basis. Hydrocodone is used for pain no greater than 4 per day  She states her allergies been under good control She states her moods overall been good as well.  Patient states no other concerns.   Review of Systems  Constitutional: Negative for activity change, appetite change and fatigue.  HENT: Negative for congestion.   Respiratory: Negative for cough and shortness of breath.   Cardiovascular: Negative for chest pain.  Gastrointestinal: Positive for nausea. Negative for abdominal pain.  Endocrine: Negative for polydipsia and polyphagia.  Genitourinary: Negative for frequency.  Neurological: Negative for weakness.  Psychiatric/Behavioral: Negative for confusion.       Objective:   Physical Exam Diabetic foot exam diminished pulses some calluses Lungs clear no crackles Heart is regular Patient obese Extremities trace edema Blood pressure good.  A1c is 7.2.       Assessment & Plan:  1. Diabetes Patient's diabetes under fair control she needs to do a better job with diet she is going to exercise more often. Watch diet as well. - POCT glycosylated hemoglobin (Hb A1C) - Microalbumin, urine  2. HYPERTENSION Blood pressure under decent control she is due for liver and been met. - Hepatic function panel - Basic metabolic panel  3. DM She will  try to lose weight. She will exercise on a regular basis  4. HYPERLIPIDEMIA Watching the fats in the diet we'll check cholesterol profile await results - Lipid panel  5. Lumbar pain with radiation down left leg She does use hydrocodone intermittently. We will see how this comes out see her back in 3 months No is some ice a month or in the summer and also for a discectomy date

## 2014-03-17 LAB — BASIC METABOLIC PANEL
BUN: 11 mg/dL (ref 6–23)
CHLORIDE: 104 meq/L (ref 96–112)
CO2: 28 mEq/L (ref 19–32)
Calcium: 9.8 mg/dL (ref 8.4–10.5)
Creat: 0.89 mg/dL (ref 0.50–1.10)
Glucose, Bld: 109 mg/dL — ABNORMAL HIGH (ref 70–99)
POTASSIUM: 4.2 meq/L (ref 3.5–5.3)
Sodium: 139 mEq/L (ref 135–145)

## 2014-03-17 LAB — LIPID PANEL
CHOL/HDL RATIO: 4 ratio
Cholesterol: 207 mg/dL — ABNORMAL HIGH (ref 0–200)
HDL: 52 mg/dL (ref >39)
LDL CALC: 133 mg/dL — AB (ref 0–99)
TRIGLYCERIDES: 109 mg/dL (ref <150)
VLDL: 22 mg/dL (ref 0–40)

## 2014-03-17 LAB — HEPATIC FUNCTION PANEL
ALT: 14 U/L (ref 0–35)
AST: 14 U/L (ref 0–37)
Albumin: 4.1 g/dL (ref 3.5–5.2)
Alkaline Phosphatase: 81 U/L (ref 39–117)
Bilirubin, Direct: 0.1 mg/dL (ref 0.0–0.3)
Indirect Bilirubin: 0.4 mg/dL (ref 0.2–1.2)
TOTAL PROTEIN: 6.9 g/dL (ref 6.0–8.3)
Total Bilirubin: 0.5 mg/dL (ref 0.2–1.2)

## 2014-03-17 LAB — MICROALBUMIN, URINE: Microalb, Ur: 3.67 mg/dL — ABNORMAL HIGH (ref 0.00–1.89)

## 2014-03-22 MED ORDER — ROSUVASTATIN CALCIUM 10 MG PO TABS: 10.0000 mg | ORAL_TABLET | Freq: Every day | ORAL | Status: DC

## 2014-03-22 NOTE — Addendum Note (Signed)
Addended by: Carmelina Noun on: 03/22/2014 02:19 PM   Modules accepted: Orders, Medications

## 2014-03-29 ENCOUNTER — Encounter: Payer: Self-pay | Admitting: Family Medicine

## 2014-03-29 NOTE — Telephone Encounter (Signed)
Dr Nicki Reaper said that he is sorry but he can not write the rx in your name for Jamie Harding because that is insurance fraud and he could lose his license. Dr. Kavin Leech recc finding out what is preffered or cheaper with his insurance (ex Sybicort, Qvar, Or albuterol/Atrovent combo nebs) and he could switch him to that med

## 2014-03-31 ENCOUNTER — Encounter: Payer: Self-pay | Admitting: Family Medicine

## 2014-03-31 NOTE — Telephone Encounter (Signed)
Dose for Advair please?

## 2014-04-01 ENCOUNTER — Encounter: Payer: Self-pay | Admitting: Family Medicine

## 2014-04-14 MED ORDER — FLUTICASONE-SALMETEROL 250-50 MCG/DOSE IN AEPB: 1.0000 | INHALATION_SPRAY | Freq: Two times a day (BID) | RESPIRATORY_TRACT | Status: DC

## 2014-05-14 ENCOUNTER — Encounter: Payer: Self-pay | Admitting: Cardiology

## 2014-05-26 ENCOUNTER — Ambulatory Visit (HOSPITAL_COMMUNITY)
Admission: RE | Admit: 2014-05-26 | Discharge: 2014-05-26 | Disposition: A | Discharge status: Home / Self Care | Payer: 59 | Source: Ambulatory Visit | Attending: Urology | Admitting: Urology

## 2014-05-26 ENCOUNTER — Other Ambulatory Visit: Payer: Self-pay | Admitting: Urology

## 2014-05-26 DIAGNOSIS — N200 Calculus of kidney: Secondary | ICD-10-CM

## 2014-05-26 LAB — LIPID PANEL
CHOL/HDL RATIO: 3 ratio
CHOLESTEROL: 157 mg/dL (ref 0–200)
HDL: 53 mg/dL (ref >39)
LDL Cholesterol: 87 mg/dL (ref 0–99)
Triglycerides: 84 mg/dL (ref <150)
VLDL: 17 mg/dL (ref 0–40)

## 2014-05-26 LAB — HEPATIC FUNCTION PANEL
ALT: 13 U/L (ref 0–35)
AST: 12 U/L (ref 0–37)
Albumin: 4.3 g/dL (ref 3.5–5.2)
Alkaline Phosphatase: 81 U/L (ref 39–117)
BILIRUBIN DIRECT: 0.1 mg/dL (ref 0.0–0.3)
Indirect Bilirubin: 0.3 mg/dL (ref 0.2–1.2)
Total Bilirubin: 0.4 mg/dL (ref 0.2–1.2)
Total Protein: 7.2 g/dL (ref 6.0–8.3)

## 2014-05-28 ENCOUNTER — Ambulatory Visit (INDEPENDENT_AMBULATORY_CARE_PROVIDER_SITE_OTHER): Payer: 59 | Admitting: Urology

## 2014-05-28 DIAGNOSIS — N3000 Acute cystitis without hematuria: Secondary | ICD-10-CM

## 2014-05-28 DIAGNOSIS — Z87442 Personal history of urinary calculi: Secondary | ICD-10-CM

## 2014-06-04 ENCOUNTER — Encounter: Payer: Self-pay | Admitting: Family Medicine

## 2014-06-11 ENCOUNTER — Ambulatory Visit (INDEPENDENT_AMBULATORY_CARE_PROVIDER_SITE_OTHER): Payer: 59 | Admitting: Family Medicine

## 2014-06-11 ENCOUNTER — Encounter: Payer: Self-pay | Admitting: Family Medicine

## 2014-06-11 VITALS — BP 130/80 | Resp 18 | Ht 62.0 in | Wt 230.0 lb

## 2014-06-11 DIAGNOSIS — K3184 Gastroparesis: Secondary | ICD-10-CM

## 2014-06-11 DIAGNOSIS — M545 Low back pain, unspecified: Secondary | ICD-10-CM

## 2014-06-11 DIAGNOSIS — E785 Hyperlipidemia, unspecified: Secondary | ICD-10-CM

## 2014-06-11 DIAGNOSIS — E119 Type 2 diabetes mellitus without complications: Secondary | ICD-10-CM

## 2014-06-11 DIAGNOSIS — M79605 Pain in left leg: Secondary | ICD-10-CM

## 2014-06-11 DIAGNOSIS — I10 Essential (primary) hypertension: Secondary | ICD-10-CM

## 2014-06-11 LAB — POCT GLYCOSYLATED HEMOGLOBIN (HGB A1C): Hemoglobin A1C: 6.7

## 2014-06-11 MED ORDER — HYDROCODONE-ACETAMINOPHEN 5-325 MG PO TABS: 1.0000 | ORAL_TABLET | Freq: Four times a day (QID) | ORAL | Status: DC | PRN

## 2014-06-11 MED ORDER — ALPRAZOLAM 0.5 MG PO TABS: 0.5000 mg | ORAL_TABLET | Freq: Two times a day (BID) | ORAL | Status: DC | PRN

## 2014-06-11 MED ORDER — GABAPENTIN 300 MG PO CAPS: ORAL_CAPSULE | ORAL | Status: DC

## 2014-06-11 NOTE — Progress Notes (Signed)
   Subjective:    Patient ID: Jamie Harding, female    DOB: 1955/12/18, 58 y.o.   MRN: [id number]  HPI  Patient is here for 3 month follow up. And patient states she hade real bad leg pain. Pain is like a ache & throbing like a tooth ache. Long discussion held with patient regarding sciatica currently right now seems like medication management would be the best option given her good exam. If she has ongoing troubles referral for possible injections may need another MRI  Long discussion held regarding diabetes diet regular activity. Review of Systems Denies chest tightness pressure pain shortness breath does have intermittent nausea. No vomiting no diarrhea. She does get some vomiting and nausea associated with gastroparesis. She denies any other specific troubles.    Objective:   Physical Exam Lungs are clear hearts regular pulse normal abdomen soft obese extremities no edema skin warm dry negative straight leg raise strength in the leg to       Assessment & Plan:  Leg pain sciatica left side increase Neurontin 1 in the morning 1 in the afternoon 2 in the evening  #2 chronic pain hydrocodone prescriptions given  Significant obesity patient to do her best to try to watch diet try to help bring this down  Diabetes good control continue current measures  Hyperlipidemia good control continue current measures  HTN good control continue current measures  Gastroparesis stable but does have frequent flareups

## 2014-07-02 ENCOUNTER — Ambulatory Visit (INDEPENDENT_AMBULATORY_CARE_PROVIDER_SITE_OTHER): Payer: 59 | Admitting: Urology

## 2014-07-02 DIAGNOSIS — N3946 Mixed incontinence: Secondary | ICD-10-CM

## 2014-07-02 DIAGNOSIS — N3000 Acute cystitis without hematuria: Secondary | ICD-10-CM

## 2014-07-08 ENCOUNTER — Other Ambulatory Visit: Payer: Self-pay | Admitting: Gastroenterology

## 2014-07-08 ENCOUNTER — Telehealth: Payer: Self-pay | Admitting: Family Medicine

## 2014-07-08 ENCOUNTER — Other Ambulatory Visit: Payer: Self-pay | Admitting: Family Medicine

## 2014-07-08 ENCOUNTER — Other Ambulatory Visit: Payer: Self-pay | Admitting: *Deleted

## 2014-07-08 MED ORDER — PROMETHAZINE HCL 25 MG PO TABS: 25.0000 mg | ORAL_TABLET | Freq: Three times a day (TID) | ORAL | Status: DC | PRN

## 2014-07-08 NOTE — Telephone Encounter (Signed)
promethazine (PHENERGAN) 25 MG tablet ondansetron (ZOFRAN) 8 MG tablet  Pt needs a refill on these meds today (she is currently out)   if possible, Please call her when they have been sent in so she  Can go pick them up at [facility] in [place] seen 06/11/14 Last filled 12/15/13

## 2014-07-08 NOTE — Telephone Encounter (Signed)
Last seen 06/11/14

## 2014-07-08 NOTE — Telephone Encounter (Signed)
Ok plus two ref 

## 2014-07-08 NOTE — Telephone Encounter (Signed)
meds sent to pharm. Pt notified.  

## 2014-07-09 ENCOUNTER — Other Ambulatory Visit: Payer: Self-pay

## 2014-07-09 MED ORDER — PROMETHAZINE HCL 25 MG PO TABS: 25.0000 mg | ORAL_TABLET | Freq: Three times a day (TID) | ORAL | Status: DC | PRN

## 2014-07-13 ENCOUNTER — Ambulatory Visit (INDEPENDENT_AMBULATORY_CARE_PROVIDER_SITE_OTHER): Payer: 59

## 2014-07-13 ENCOUNTER — Ambulatory Visit (INDEPENDENT_AMBULATORY_CARE_PROVIDER_SITE_OTHER): Payer: 59 | Admitting: Orthopedic Surgery

## 2014-07-13 DIAGNOSIS — M79609 Pain in unspecified limb: Secondary | ICD-10-CM

## 2014-07-13 DIAGNOSIS — M79641 Pain in right hand: Secondary | ICD-10-CM

## 2014-07-13 DIAGNOSIS — M79642 Pain in left hand: Secondary | ICD-10-CM

## 2014-07-14 NOTE — Progress Notes (Signed)
Chief Complaint  Patient presents with  . Follow-up    recheck Left CTR, DOS 02/06/13 AND recheck trigger finger left ring, right middle    S/p bilateral CTR also c/o trigger fingers   ROS stiffness and weakness but no numbness or tingling   Past Medical History  Diagnosis Date  . DM (diabetes mellitus)   . HTN (hypertension)   . Obese   . Depression   . Anxiety   . HLD (hyperlipidemia)   . Hiatal hernia   . Asthma   . GERD (gastroesophageal reflux disease)   . Fatty liver   . Gastritis   . Pancreatitis   . Tibialis tendinitis   . Chest pain     Nuclear  February, 2012,,  breast attenuation, no definite scar or ischemia  . Gastroparesis     NEGATIVE GES 2010  . Rheumatoid arthritis(714.0)   . Stroke 2008    no deficits  . Neuropathy   . Ejection fraction     EF 60%, echo, February, 2012, mild LVH  . Family history of anesthesia complication     post anesthesia N/V   Past Surgical History  Procedure Laterality Date  . Cesarean section    . Cholecystectomy    . Ankle surgery      remove extra bone-left  . Finger surgery      left little finger  . Lithotripsy    . Dilation and curettage of uterus      multiple  . Laser ablation of the cervix    . Carpal tunnel release      right side  . Tubal ligation    . Gastric emptying  07/27/2009    The amount of activity in the stomach at 120 minutes was 13% which is in the normal range  . Esophagogastroduodenoscopy  07/29/2009    Mild gastritis, benign path  . Colonoscopy with propofol  09/16/2012    WUJ:WJXB sessile polyps ranging between 3-95mm in size were found in the sigmoid colon and rectum; polypectomy was performed/Mild diverticulosis was noted throughout the entire examined colon/Small internal hemorrhoids  . Esophagogastroduodenoscopy (egd) with propofol  09/16/2012    SLF:Non-erosive gastritis (inflammation) was found; multiple bx/The duodenal mucosa showed no abnormalities in the ampulla and bulb and second  portion of the duodenum/NAUSEA/VOMITING MOST LIKELY DUE TO GERD/GASTRITIS  . Savory dilation  09/16/2012    Procedure: SAVORY DILATION;  Surgeon: Danie Binder, MD;  Location: [facility];  Service: Endoscopy;  Laterality: N/A;  16 fr dilation  . Polypectomy  09/16/2012    Procedure: POLYPECTOMY;  Surgeon: Danie Binder, MD;  Location: [facility];  Service: Endoscopy;  Laterality: N/A;  . Carpal tunnel release Left 02/06/2013    Procedure: CARPAL TUNNEL RELEASE ;  Surgeon: Carole Civil, MD;  Location: [facility];  Service: Orthopedics;  Laterality: Left;  procedure end 1135  . Trigger finger release Left 02/06/2013    Procedure: RELEASE TRIGGER FINGER LEFT LONG FINGER/A-1 PULLEY;  Surgeon: Carole Civil, MD;  Location: [facility];  Service: Orthopedics;  Laterality: Left;  procedure began 1136    General appearance is normal, the patient is alert and oriented x3 with normal mood and affect.  Gait is normal   Upper extremities:   2 incisions are noted over the carpal tunnel one on each side. She has triggering of the left ring finger and right middle finger with tenderness over the A1 pulley and normal flexion power. Neurovascular exam still intact. She has  no wrist pain or swelling no instability of the wrist joint no loss of motion. Her hands all digits have normal motion as well  X-rays of her hands show no arthritis  She does all the signs and symptoms of carpal tunnel syndrome except she has not noticed numbness or tingling. She does drop things frequently.  At this point we recommend injections for trigger fingers and reestablish wearing of the wrist splints and for 6 weeks we will try this for treatment and then reassess the situation. Bilateral trigger finger injections were performed   Procedure note trigger finger injection  Diagnosis trigger finger Postop diagnosis trigger finger Procedure injection of trigger finger Finger injected left left ring and right middle or right long  finger  Details of procedure: After verbal consent and timeout to confirm site the left ring finger and right long finger was injected with 1 cc of 40 mg of Depo-Medrol and 1 cc of 1% lidocaine  The procedure was tolerated well without complication

## 2014-07-15 ENCOUNTER — Ambulatory Visit: Payer: 59 | Admitting: Orthopedic Surgery

## 2014-07-28 LAB — HEMOGLOBIN A1C: HEMOGLOBIN A1C: 8.2 % — AB (ref 4.0–6.0)

## 2014-08-16 ENCOUNTER — Other Ambulatory Visit: Payer: Self-pay | Admitting: Family Medicine

## 2014-08-16 DIAGNOSIS — Z139 Encounter for screening, unspecified: Secondary | ICD-10-CM

## 2014-08-20 ENCOUNTER — Other Ambulatory Visit: Payer: Self-pay | Admitting: Nurse Practitioner

## 2014-08-20 ENCOUNTER — Other Ambulatory Visit: Payer: Self-pay | Admitting: Family Medicine

## 2014-08-23 ENCOUNTER — Other Ambulatory Visit: Payer: Self-pay | Admitting: Family Medicine

## 2014-08-24 ENCOUNTER — Encounter: Payer: Self-pay | Admitting: Orthopedic Surgery

## 2014-08-24 ENCOUNTER — Ambulatory Visit: Payer: 59 | Admitting: Orthopedic Surgery

## 2014-09-10 ENCOUNTER — Other Ambulatory Visit: Payer: Self-pay | Admitting: Gastroenterology

## 2014-09-10 ENCOUNTER — Other Ambulatory Visit: Payer: Self-pay | Admitting: Family Medicine

## 2014-09-10 MED ORDER — PROMETHAZINE HCL 25 MG PO TABS: 25.0000 mg | ORAL_TABLET | Freq: Three times a day (TID) | ORAL | Status: DC | PRN

## 2014-09-10 NOTE — Telephone Encounter (Signed)
Original authorizing provider: [name], MD Jamie Harding would like a refill of the following medications:    HYDROcodone-acetaminophen (NORCO/VICODIN) 5-325 MG per tablet [name], MD]  Preferred pharmacy: [facility], [place]: Medication renewals requested in this message routed to other providers: promethazine (PHENERGAN) 25 MG tablet Laban Emperor, NP]  Last seen: 7/24

## 2014-09-10 NOTE — Telephone Encounter (Signed)
Completed refill

## 2014-09-12 NOTE — Telephone Encounter (Signed)
Pt has apt 10/26 , will do then

## 2014-09-13 ENCOUNTER — Ambulatory Visit (INDEPENDENT_AMBULATORY_CARE_PROVIDER_SITE_OTHER): Payer: 59 | Admitting: Family Medicine

## 2014-09-13 ENCOUNTER — Encounter: Payer: Self-pay | Admitting: Family Medicine

## 2014-09-13 ENCOUNTER — Ambulatory Visit (HOSPITAL_COMMUNITY)
Admission: RE | Admit: 2014-09-13 | Discharge: 2014-09-13 | Disposition: A | Discharge status: Home / Self Care | Payer: 59 | Source: Ambulatory Visit | Attending: Family Medicine | Admitting: Family Medicine

## 2014-09-13 VITALS — BP 130/82 | Ht 62.0 in | Wt 232.4 lb

## 2014-09-13 DIAGNOSIS — R5382 Chronic fatigue, unspecified: Secondary | ICD-10-CM

## 2014-09-13 DIAGNOSIS — E538 Deficiency of other specified B group vitamins: Secondary | ICD-10-CM

## 2014-09-13 DIAGNOSIS — E559 Vitamin D deficiency, unspecified: Secondary | ICD-10-CM

## 2014-09-13 DIAGNOSIS — Z139 Encounter for screening, unspecified: Secondary | ICD-10-CM

## 2014-09-13 DIAGNOSIS — Z1231 Encounter for screening mammogram for malignant neoplasm of breast: Secondary | ICD-10-CM | POA: Diagnosis not present

## 2014-09-13 DIAGNOSIS — E119 Type 2 diabetes mellitus without complications: Secondary | ICD-10-CM

## 2014-09-13 DIAGNOSIS — M545 Low back pain: Secondary | ICD-10-CM

## 2014-09-13 DIAGNOSIS — M79605 Pain in left leg: Secondary | ICD-10-CM

## 2014-09-13 LAB — POCT GLYCOSYLATED HEMOGLOBIN (HGB A1C): HEMOGLOBIN A1C: 7.3

## 2014-09-13 MED ORDER — HYDROCODONE-ACETAMINOPHEN 5-325 MG PO TABS: 1.0000 | ORAL_TABLET | Freq: Four times a day (QID) | ORAL | Status: DC | PRN

## 2014-09-13 MED ORDER — FLUTICASONE-SALMETEROL 250-50 MCG/DOSE IN AEPB: 1.0000 | INHALATION_SPRAY | Freq: Two times a day (BID) | RESPIRATORY_TRACT | Status: DC

## 2014-09-13 NOTE — Progress Notes (Signed)
   Subjective:    Patient ID: Jamie Harding, female    DOB: 1956-06-08, 58 y.o.   MRN: [id number]  HPI This patient was seen today for chronic pain  The medication list was reviewed and updated.   -Compliance with pain medication: yes  The patient was advised the importance of maintaining medication and not using illegal substances with these.  Refills needed: yes  The patient was educated that we can provide 3 monthly scripts for their medication, it is their responsibility to follow the instructions.  Side effects or complications from medications: no   Patient is aware that pain medications are meant to minimize the severity of the pain to allow their pain levels to improve to allow for better function. They are aware of that pain medications cannot totally remove their pain.  Due for UDT ( at least once per year) : Next visit  Also follow up on diabetes.    Review of Systems  Constitutional: Negative for activity change, appetite change and fatigue.  Endocrine: Negative for polydipsia and polyphagia.  Genitourinary: Negative for frequency.  Neurological: Negative for weakness.  Psychiatric/Behavioral: Negative for confusion.       Objective:   Physical Exam  Vitals reviewed. Constitutional: She appears well-nourished. No distress.  Cardiovascular: Normal rate, regular rhythm and normal heart sounds.   No murmur heard. Pulmonary/Chest: Effort normal and breath sounds normal. No respiratory distress.  Musculoskeletal: She exhibits no edema.  Lymphadenopathy:    She has no cervical adenopathy.  Neurological: She is alert. She exhibits normal muscle tone.  Psychiatric: Her behavior is normal.          Assessment & Plan:  Chronic lumbar pain causes radiation into the legs this is causing great concern to the patient she also has neuropathy in her feet. It is hard to discern how much of this is diabetes and how much is nerve damage in her back. She is to continue  Neurontin. Continue pain medication as prescribed does help her function. We will set her up with a specialist for further evaluation. Patient maintained up needing nerve conduction studies as well.  #2 diabetes subpar control we discussed the importance of getting glucose under better control A1c was higher so we are moving in the right direction she had it checked through the wellness nurse with the hospital. It was above 8 now it's under 8.  #3 history vitamin D deficiency check vitamin D #4 history of B12 deficiency check B12 #5 anxiety issues may continue Xanax on a when necessary basis cautioned drowsiness #6 HTN good control continue current measures Comprehensive lab work early next year

## 2014-09-14 ENCOUNTER — Ambulatory Visit: Payer: 59 | Admitting: Family Medicine

## 2014-09-14 LAB — VITAMIN D 25 HYDROXY (VIT D DEFICIENCY, FRACTURES): Vit D, 25-Hydroxy: 20 ng/mL — ABNORMAL LOW (ref 30–89)

## 2014-09-14 LAB — VITAMIN B12: Vitamin B-12: 939 pg/mL — ABNORMAL HIGH (ref 211–911)

## 2014-09-17 ENCOUNTER — Encounter: Payer: 59 | Admitting: Nurse Practitioner

## 2014-10-01 ENCOUNTER — Other Ambulatory Visit: Payer: Self-pay | Admitting: Gastroenterology

## 2014-10-01 ENCOUNTER — Other Ambulatory Visit: Payer: Self-pay | Admitting: Family Medicine

## 2014-10-01 MED ORDER — INSULIN GLARGINE 100 UNIT/ML SOLOSTAR PEN: PEN_INJECTOR | SUBCUTANEOUS | Status: DC

## 2014-10-01 MED ORDER — ALBUTEROL SULFATE HFA 108 (90 BASE) MCG/ACT IN AERS: 2.0000 | INHALATION_SPRAY | RESPIRATORY_TRACT | Status: DC | PRN

## 2014-10-05 ENCOUNTER — Encounter: Payer: Self-pay | Admitting: Gastroenterology

## 2014-10-05 NOTE — Telephone Encounter (Signed)
It has been more than 2 years since this patient was seen. By policy, I cannot write her anymore phenergan without OV.

## 2014-10-05 NOTE — Telephone Encounter (Signed)
APPT MADE AND LETTER SENT  °

## 2014-10-05 NOTE — Telephone Encounter (Signed)
Jamie Harding please make her an appointment

## 2014-10-12 ENCOUNTER — Other Ambulatory Visit: Payer: Self-pay | Admitting: Family Medicine

## 2014-10-12 NOTE — Telephone Encounter (Signed)
She may have refills as requested numb these medications for six-month worth with of refills

## 2014-10-13 MED ORDER — GABAPENTIN 300 MG PO CAPS: ORAL_CAPSULE | ORAL | Status: DC

## 2014-10-13 MED ORDER — PROMETHAZINE HCL 25 MG PO TABS: 25.0000 mg | ORAL_TABLET | Freq: Three times a day (TID) | ORAL | Status: DC | PRN

## 2014-10-31 ENCOUNTER — Other Ambulatory Visit: Payer: Self-pay | Admitting: Family Medicine

## 2014-11-09 ENCOUNTER — Ambulatory Visit: Payer: 59 | Admitting: Gastroenterology

## 2014-11-09 ENCOUNTER — Encounter: Payer: Self-pay | Admitting: Gastroenterology

## 2014-11-17 ENCOUNTER — Ambulatory Visit (INDEPENDENT_AMBULATORY_CARE_PROVIDER_SITE_OTHER): Payer: Self-pay | Admitting: Family Medicine

## 2014-11-17 VITALS — Wt 225.0 lb

## 2014-11-17 DIAGNOSIS — E1169 Type 2 diabetes mellitus with other specified complication: Secondary | ICD-10-CM

## 2014-11-17 NOTE — Progress Notes (Signed)
Patient presents today for annual pharmacy consult as part of the employer-sponsored [employer] program. Current diabetes regimen includes Lantus, Novolog, and Metformin. Patient also continues on daily ASA, ACEi, and statin. Most recent MD follow-up was October 2015. Patient has a pending appt for routine 3 month follow-up in Jan 2015. We have reviewed and updated med list and patient demonstrates an understanding of medication regimen. Of note, patient discontinued Invokana approximately 3 months ago after repeated UTIs and yeast infections. These became more prominent after dose increase to 300 mg daily. Patient is undecided if she is willing to try the lower 100 mg dose again, as she reports great control of glucose on this med. Patient reports intolerance to Byetta which was discontinued approximately 3 years ago due to gastroparesis and pancreatitis. A1c in September at MD office was 8.2. Patient denies any major health changes recently.   Diabetes Assessment: Type of Diabetes: Type 2; Year of diagnosis 1993; MD managing Diabetes [name]; takes an aspirin a day; Sees Diabetes provider 4 or more times per year; checks blood glucose once daily; Other Diabetes History: Current med regimen includes Lantus 100 units daily (we have discussed dividing the dose), Novolog 15 units prior to meals, and Metformin 1000 twice daily. Patient does maintain good medication compliance, and misses doses only on occasion. Patient reports that sliding scale meal coverage insulin is not effective in controlling post prandial glucose, so I have asked her to discuss this with MD at January appt. Patient did not bring meter today but is currently testing once daily and more if symptomatic. Patient was using One Touch Verio, but has switched today to TrueResult meter. I have provided a new meter, supplies, and have provided instruction for use. Hypoglycemia frequency is rare but patient is familiar with how to recognize  symptoms and demonstrates appropriate treatment. Patient reports signs and symptoms of neuropathy including numbness in great toes, along with intermittent tingling, burning, and stabbing in toes and fingers. Patient does not have wounds on feet or lower legs, but has several slow healing ulcers on both arms and face. These ulcers range in size and last several months. At this time she has 5-6 of these sores. MD is aware and has cultured these for MRSA, they resulted as negative. Patient has tried several ointments and creams with no results. Patient is due for yearly eye exam and has one scheduled for early Feb 2016  Lifestyle Factors: Diet - Patient is not currently working on any dietary goals; however, she is aware of the need to make improvements in this area. In the past patient has done well on Weight Watchers and is considering this again. She has several dietary intolerances due to gastroparesis, including raw vegetables and any corn products. She can tolerate cooked and steamed vegetables. I have encouraged her to join Weight Watchers. Exercise - Patient is not currently working toward any exercise goals, but plans to make changes in this area starting January 1. She is aware of the need to lose weight and has enjoyed exercise in the past. She also reports that regular exercise helps improve her back, hip, and leg pain. Patient has set a personal goal to begin exercising 4 days per week starting with 15 minute intervals. She will exercise at home as she owns several exercise machines. She is also considering joining [employer] DM prevention program.  Plan: 1) Finish One Touch Verio strips and then switch to True Result meter 2) Speak with Dr. Wolfgang Phoenix about  adjusting meal time insulin and sliding scale 3) Start exercise regimen New Year's Day (goal: 15 minute intervals 4 days per week) 4) Consider joining Weight Watchers 5) Follow-up with Marcie Bal in January

## 2014-11-22 ENCOUNTER — Other Ambulatory Visit: Payer: Self-pay | Admitting: Family Medicine

## 2014-11-24 ENCOUNTER — Other Ambulatory Visit: Payer: Self-pay

## 2014-11-24 ENCOUNTER — Other Ambulatory Visit: Payer: Self-pay | Admitting: *Deleted

## 2014-11-24 MED ORDER — FUROSEMIDE 20 MG PO TABS: 20.0000 mg | ORAL_TABLET | Freq: Every day | ORAL | Status: DC

## 2014-11-24 MED ORDER — AMLODIPINE BESYLATE 10 MG PO TABS: 10.0000 mg | ORAL_TABLET | Freq: Every day | ORAL | Status: DC

## 2014-11-26 ENCOUNTER — Ambulatory Visit: Payer: 59 | Admitting: Family Medicine

## 2014-11-29 ENCOUNTER — Ambulatory Visit (INDEPENDENT_AMBULATORY_CARE_PROVIDER_SITE_OTHER): Payer: 59 | Admitting: Cardiology

## 2014-11-29 ENCOUNTER — Encounter: Payer: Self-pay | Admitting: Cardiology

## 2014-11-29 VITALS — BP 140/76 | HR 63 | Ht 62.0 in | Wt 236.2 lb

## 2014-11-29 DIAGNOSIS — R079 Chest pain, unspecified: Secondary | ICD-10-CM

## 2014-11-29 DIAGNOSIS — I10 Essential (primary) hypertension: Secondary | ICD-10-CM

## 2014-11-29 DIAGNOSIS — R0602 Shortness of breath: Secondary | ICD-10-CM

## 2014-11-29 DIAGNOSIS — R0609 Other forms of dyspnea: Secondary | ICD-10-CM | POA: Insufficient documentation

## 2014-11-29 NOTE — Assessment & Plan Note (Signed)
The patient had a nuclear scan in 2012 showing no ischemia. She has some chest discomfort under her left breast. I'm not convinced that this represents ischemia. However her short breath with exercise is more concerning. A stress nuclear scan will be done. I'm carefully arranging this so that we will have her walk fully on the treadmill. Exercise information be obtained including O2 sats.

## 2014-11-29 NOTE — Patient Instructions (Signed)
**Note De-Identified  Obfuscation** Your physician recommends that you continue on your current medications as directed. Please refer to the Current Medication list given to you today.  Your physician has requested that you have an exercise stress myoview. For further information please visit [email or web address]. Please follow instruction sheet, as given.  Your physician recommends that you schedule a follow-up appointment on December 23, 2014 per Dr Ron Parker

## 2014-11-29 NOTE — Assessment & Plan Note (Signed)
The patient has increasing shortness of breath after climbing a flight of stairs. She also feels increased heart rate with this. We need to be sure that this does not represent ischemia. She will have a stress test walking on the treadmill with nuclear imaging. We will assess her Foley on the treadmill including O2 sats.

## 2014-11-29 NOTE — Progress Notes (Signed)
Patient ID: Jamie Harding, female   DOB: 08-03-1956, 59 y.o.   MRN: [id number]    HPI Patient is seen today to help reassess her shortness of breath and some chest discomfort. I saw her last January, 2014. Prior to that I had seen her in February, 2012. At that time she had an echo showing good LV function. She had a nuclear scan showing some breast attenuation but no definite scar or ischemia. When I saw her in 2014 she was stable. I felt that no further workup was needed. The patient is now here giving me an update about her symptoms. She has been concerned about some discomfort in her chest below her left breast. This does not sound ischemic in origin. However she says that when she climbs stairs she gets significant shortness of breath at the top of one flight of stairs. She knows that her weight affects her overall exercise capacity. However she feels that it has decreased in that the shortness of breath with one flight of stairs is a change for her. Also at that time she feels a rapid heartbeat.  Allergies  Allergen Reactions  . Byetta 10 Mcg Pen [Exenatide] Diarrhea and Nausea And Vomiting      touch of pancreatis  . Naproxen Nausea And Vomiting  . Augmentin [Amoxicillin-Pot Clavulanate]     diarrhea  . Azithromycin Nausea And Vomiting and Rash  . Erythromycin Hives       . Invokana [Canagliflozin]     Urinary issues  . Morphine Hives and Rash  . Sulfonamide Derivatives Nausea And Vomiting and Rash    Current Outpatient Prescriptions  Medication Sig Dispense Refill  . ADVAIR DISKUS 250-50 MCG/DOSE AEPB INHALE 1 PUFF INTO THE LUNGS 2 (TWO) TIMES DAILY. 60 each 5  . albuterol (PROVENTIL HFA;VENTOLIN HFA) 108 (90 BASE) MCG/ACT inhaler Inhale 2 puffs into the lungs every 4 (four) hours as needed for wheezing. 3 Inhaler 0  . ALPRAZolam (XANAX) 0.5 MG tablet Take 1 tablet (0.5 mg total) by mouth 2 (two) times daily as needed. For anxiety 60 tablet 4  . amLODipine (NORVASC) 10 MG tablet  Take 1 tablet (10 mg total) by mouth daily. 30 tablet 0  . aspirin EC 81 MG tablet Take 81 mg by mouth daily.    . cetirizine (ZYRTEC) 10 MG tablet Take 1 tablet (10 mg total) by mouth daily. 30 tablet 6  . cyanocobalamin 1000 MCG tablet Take 1,000 mcg by mouth daily.     . fluconazole (DIFLUCAN) 150 MG tablet TAKE ONE WEEKLY 6 tablet 5  . fluticasone (FLONASE) 50 MCG/ACT nasal spray USE 2 SPRAYS IN EACH NOSTRIL DAILY 16 g 5  . furosemide (LASIX) 20 MG tablet Take 1 tablet (20 mg total) by mouth daily. 90 tablet 0  . gabapentin (NEURONTIN) 300 MG capsule one tablet q am, one tablet q afternoon and 2 tabs q evening 360 capsule 1  . glucose blood test strip Use as instructed 100 each 12  . HYDROcodone-acetaminophen (NORCO/VICODIN) 5-325 MG per tablet Take 1 tablet by mouth every 6 (six) hours as needed. 120 tablet 0  . Insulin Glargine (LANTUS SOLOSTAR) 100 UNIT/ML Solostar Pen INJECT 100 UNITS EVERY NIGHT AT BEDTIME 15 mL 0  . lisinopril (PRINIVIL,ZESTRIL) 20 MG tablet TAKE 1 TABLET BY MOUTH DAILY 90 tablet 0  . metFORMIN (GLUCOPHAGE) 1000 MG tablet TAKE 1 TABLET BY MOUTH TWICE DAILY 180 tablet 1  . methocarbamol (ROBAXIN) 500 MG tablet TAKE 1 TABLET BY MOUTH  TWICE DAILY 180 tablet 3  . Multiple Vitamin (MULTIVITAMIN WITH MINERALS) TABS Take 1 tablet by mouth daily.    Marland Kitchen NOVOLOG FLEXPEN 100 UNIT/ML FlexPen USE THREE TIMES A DAY UNDER THE SKIN PER SLIDING SCALE (UP TO 15 UNITS AT A TIME 15 mL 3  . ondansetron (ZOFRAN) 8 MG tablet TAKE 1 TABLET BY MOUTH EVERY 12 HOURS AS NEEDED FOR NAUSEA 20 tablet 2  . pantoprazole (PROTONIX) 40 MG tablet Take 1 tablet (40 mg total) by mouth 2 (two) times daily. 180 tablet 3  . promethazine (PHENERGAN) 25 MG tablet Take 1 tablet (25 mg total) by mouth every 8 (eight) hours as needed for nausea. 30 tablet 3  . pyridOXINE (VITAMIN B-6) 100 MG tablet Take 100 mg by mouth daily.    . rosuvastatin (CRESTOR) 10 MG tablet Take 1 tablet (10 mg total) by mouth daily. 90  tablet 1  . UNIFINE PENTIPS 31G X 8 MM MISC USE AS DIRECTED THREE TIMES A DAY AS NEEDED 300 each 5  . venlafaxine (EFFEXOR) 75 MG tablet Take 1 tablet (75 mg total) by mouth 2 (two) times daily with a meal. 180 tablet 3  . insulin glargine (LANTUS) 100 UNIT/ML injection Inject 100 Units into the skin at bedtime.     No current facility-administered medications for this visit.    History   Social History  . Marital Status: Married    Spouse Name: N/A    Number of Children: N/A  . Years of Education: N/A   Occupational History  . 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)  .     Social History Main Topics  . Smoking status: Former Smoker -- 0.50 packs/day for 3 years    Types: Cigarettes    Quit date: 09/11/1987  . Smokeless tobacco: Not on file  . Alcohol Use: No  . Drug Use: No  . Sexual Activity: Yes    Birth Control/ Protection: Surgical   Other Topics Concern  . Not on file   Social History Narrative    Family History  Problem Relation Age of Onset  . Breast cancer Mother   . Diabetes Father   . Coronary artery disease    . Arthritis    . Asthma    . Colon cancer Neg Hx   . Diabetes Sister     Past Medical History  Diagnosis Date  . DM (diabetes mellitus)   . HTN (hypertension)   . Obese   . Depression   . Anxiety   . HLD (hyperlipidemia)   . Hiatal hernia   . Asthma   . GERD (gastroesophageal reflux disease)   . Fatty liver   . Gastritis   . Pancreatitis   . Tibialis tendinitis   . Chest pain     Nuclear  February, 2012,,  breast attenuation, no definite scar or ischemia  . Gastroparesis     NEGATIVE GES 2010  . Rheumatoid arthritis(714.0)   . Stroke 2008    no deficits  . Neuropathy   . Ejection fraction     EF 60%, echo, February, 2012, mild LVH  . Family history of anesthesia complication     post anesthesia N/V    Past Surgical History  Procedure Laterality Date  . Cesarean section    . Cholecystectomy    . Ankle  surgery      remove extra bone-left  . Finger surgery      left little finger  .  Lithotripsy    . Dilation and curettage of uterus      multiple  . Laser ablation of the cervix    . Carpal tunnel release      right side  . Tubal ligation    . Gastric emptying  07/27/2009    The amount of activity in the stomach at 120 minutes was 13% which is in the normal range  . Esophagogastroduodenoscopy  07/29/2009    Mild gastritis, benign path  . Colonoscopy with propofol  09/16/2012    BDZ:HGDJ sessile polyps ranging between 3-75mm in size were found in the sigmoid colon and rectum; polypectomy was performed/Mild diverticulosis was noted throughout the entire examined colon/Small internal hemorrhoids  . Esophagogastroduodenoscopy (egd) with propofol  09/16/2012    SLF:Non-erosive gastritis (inflammation) was found; multiple bx/The duodenal mucosa showed no abnormalities in the ampulla and bulb and second portion of the duodenum/NAUSEA/VOMITING MOST LIKELY DUE TO GERD/GASTRITIS  . Savory dilation  09/16/2012    Procedure: SAVORY DILATION;  Surgeon: Danie Binder, MD;  Location: [facility];  Service: Endoscopy;  Laterality: N/A;  16 fr dilation  . Polypectomy  09/16/2012    Procedure: POLYPECTOMY;  Surgeon: Danie Binder, MD;  Location: [facility];  Service: Endoscopy;  Laterality: N/A;  . Carpal tunnel release Left 02/06/2013    Procedure: CARPAL TUNNEL RELEASE ;  Surgeon: Carole Civil, MD;  Location: [facility];  Service: Orthopedics;  Laterality: Left;  procedure end 1135  . Trigger finger release Left 02/06/2013    Procedure: RELEASE TRIGGER FINGER LEFT LONG FINGER/A-1 PULLEY;  Surgeon: Carole Civil, MD;  Location: [facility];  Service: Orthopedics;  Laterality: Left;  procedure began 1136    Patient Active Problem List   Diagnosis Date Noted  . Dyspareunia 09/16/2013  . Trigger finger, acquired 02/17/2013  . S/P carpal tunnel release 02/17/2013  . Lumbar pain with radiation down left leg  02/16/2013  . Greater trochanteric bursitis 01/29/2013  . Sinus tarsi syndrome 01/29/2013  . Chest pain   . Ejection fraction   . Dyspepsia 08/28/2012  . Encounter for screening colonoscopy 08/28/2012  . Plantar fasciitis 12/20/2011  . TRIGGER FINGER, LEFT MIDDLE 07/19/2010  . GASTROPARESIS 07/19/2009  . DM 07/15/2009  . HYPERLIPIDEMIA 07/15/2009  . ANXIETY 07/15/2009  . DEPRESSION 07/15/2009  . HYPERTENSION 07/15/2009  . ASTHMA, UNSPECIFIED 07/15/2009  . HIATAL HERNIA 07/15/2009  . FATTY LIVER DISEASE 07/15/2009  . DYSPHAGIA UNSPECIFIED 07/15/2009  . PANCREATITIS, HX OF 07/15/2009  . GASTRITIS, HX OF 07/15/2009  . TIBIALIS TENDINITIS 07/12/2008    ROS  Patient denies fever, chills, headache, sweats, rash, change in vision, change in hearing, chest pain, cough, nausea or vomiting, urinary symptoms. All other systems are reviewed and are negative.  PHYSICAL EXAM Patient is significantly overweight. She is oriented to person time and place. Affect is normal. Head is atraumatic. Sclera and conjunctiva are normal. There is no jugular venous distention. Lungs are clear respiratory effort is not labored. Cardiac exam reveals an S1 and S2. The abdomen is soft. There is no peripheral edema. There are no musculoskeletal deformities. There are no skin rashes. Neurologic is grossly intact.  Filed Vitals:   11/29/14 1619  BP: 140/76  Pulse: 63  Height: 5\' 2"  (1.575 m)  Weight: 236 lb 3.2 oz (107.14 kg)  SpO2: 98%   EKG is done today and reviewed by me. The EKG is normal.  ASSESSMENT & PLAN

## 2014-11-29 NOTE — Assessment & Plan Note (Addendum)
Blood pressures controlled. No change in therapy.  As part of today's evaluation I spent greater than 25 minutes with the patient. More than half of this time as been with direct counseling with her. Also I reviewed old records. I have not seen her since January, 2014.

## 2014-12-02 ENCOUNTER — Encounter: Payer: Self-pay | Admitting: Family Medicine

## 2014-12-02 ENCOUNTER — Ambulatory Visit (INDEPENDENT_AMBULATORY_CARE_PROVIDER_SITE_OTHER): Payer: 59 | Admitting: Family Medicine

## 2014-12-02 VITALS — BP 130/82 | Ht 62.0 in | Wt 234.0 lb

## 2014-12-02 DIAGNOSIS — E559 Vitamin D deficiency, unspecified: Secondary | ICD-10-CM

## 2014-12-02 DIAGNOSIS — I10 Essential (primary) hypertension: Secondary | ICD-10-CM

## 2014-12-02 DIAGNOSIS — E785 Hyperlipidemia, unspecified: Secondary | ICD-10-CM

## 2014-12-02 DIAGNOSIS — K760 Fatty (change of) liver, not elsewhere classified: Secondary | ICD-10-CM

## 2014-12-02 DIAGNOSIS — E1165 Type 2 diabetes mellitus with hyperglycemia: Secondary | ICD-10-CM

## 2014-12-02 MED ORDER — LINAGLIPTIN 5 MG PO TABS: 5.0000 mg | ORAL_TABLET | Freq: Every day | ORAL | Status: DC

## 2014-12-02 NOTE — Progress Notes (Signed)
   Subjective:    Patient ID: Jamie Harding, female    DOB: 1956/02/25, 59 y.o.   MRN: [id number]  Diabetes She presents for her follow-up diabetic visit. She has type 2 diabetes mellitus. There are no hypoglycemic associated symptoms. Associated symptoms include polydipsia. Current diabetic treatment includes insulin injections and oral agent (monotherapy). She is compliant with treatment all of the time. She is currently taking insulin pre-breakfast, pre-lunch, pre-dinner and at bedtime. She monitors blood glucose at home 3-4 x per day. Blood glucose monitoring compliance is good. Her highest blood glucose is >200 mg/dl. Her overall blood glucose range is 180-200 mg/dl. She does not see a podiatrist.Eye exam is not current (Feb 5th 2016).      Review of Systems  Endocrine: Positive for polydipsia.       Objective:   Physical Exam        Assessment & Plan:  1 diabetes subpar control we talked about increasing the insulin I wrote her out that she will useamount 15 units with each meal plus a sliding scale added on depending on what her readings were she will continue her long-acting insulin she is also to start Weight Watchers, she admits over the past couple months her diet has not been good she will send [name] regular readings and update. Add Tradjenta  #2 lab work is ordered.  #3 blood pressure under decent control taken her medicine as directed  #4 intermittent dizziness she feels unsteady intermittently only last a few minutes at a time only have a couple times per week she has not fallen because of it no unilateral numbness or weakness. I feel that this is more nonspecific currently I don't recommend any workup that she will keep with trying to get her sugar under better control keep blood pressure under good control cholesterol under good control  #5 we talked about her weight she is coming doing 8 watchers we also talked about the possibility of gastric sleeve  Follow-up in mid  April to late May

## 2014-12-02 NOTE — Progress Notes (Signed)
   Subjective:    Patient ID: Jamie Harding, female    DOB: [birth year], 59 y.o.   MRN: [id number]  HPI Reviewed: Agree with the documentation and management of our [place] Pharmacologist.    Review of Systems     Objective:   Physical Exam        Assessment & Plan:

## 2014-12-03 ENCOUNTER — Ambulatory Visit (HOSPITAL_COMMUNITY): Payer: 59 | Attending: Internal Medicine | Admitting: Radiology

## 2014-12-03 ENCOUNTER — Encounter (HOSPITAL_COMMUNITY): Payer: 59

## 2014-12-03 DIAGNOSIS — Z794 Long term (current) use of insulin: Secondary | ICD-10-CM | POA: Diagnosis not present

## 2014-12-03 DIAGNOSIS — R079 Chest pain, unspecified: Secondary | ICD-10-CM | POA: Insufficient documentation

## 2014-12-03 DIAGNOSIS — I10 Essential (primary) hypertension: Secondary | ICD-10-CM | POA: Insufficient documentation

## 2014-12-03 DIAGNOSIS — R0609 Other forms of dyspnea: Secondary | ICD-10-CM | POA: Insufficient documentation

## 2014-12-03 DIAGNOSIS — E119 Type 2 diabetes mellitus without complications: Secondary | ICD-10-CM | POA: Insufficient documentation

## 2014-12-03 DIAGNOSIS — R0602 Shortness of breath: Secondary | ICD-10-CM

## 2014-12-03 MED ORDER — TECHNETIUM TC 99M SESTAMIBI GENERIC - CARDIOLITE
33.0000 | Freq: Once | INTRAVENOUS | Status: AC | PRN
  Administered 2014-12-03: 33 via INTRAVENOUS

## 2014-12-03 NOTE — Progress Notes (Signed)
[facility] [address] [place], [place] [address] [phone]    Cardiology Nuclear Med Study  Jamie Harding is a 59 y.o. female     MRN : [id number]     DOB: [birth year]  Procedure Date: 12/03/2014  Nuclear Med Background Indication for Stress Test:  Evaluation for Ischemia History:  MPI 2012 (normal) Cardiac Risk Factors: Hypertension and IDDM Type 2  Symptoms:  Chest Pain (last date of chest discomfort was this morning) and DOE   Nuclear Pre-Procedure Caffeine/Decaff Intake:  None NPO After: 8:00am   Lungs:  clear O2 Sat: 96% on room air. IV 0.9% NS with Angio Cath:  22g  IV Site: R Antecubital  IV Started by:  Crissie Figures, RN  Chest Size (in):  46 Cup Size: DD  Height: 5\' 2"  (1.575 m)  Weight:  230 lb (104.327 kg)  BMI:  Body mass index is 42.06 kg/(m^2). Tech Comments:  N/A    Nuclear Med Study 1 or 2 day study: 2 day  Stress Test Type:  Stress  Reading MD: N/A  Order Authorizing Provider:  [name], MD  Resting Radionuclide: Technetium 59m Sestamibi  Resting Radionuclide Dose: 33.0 mCi on 12/09/2014   Stress Radionuclide:  Technetium 19m Sestamibi  Stress Radionuclide Dose: 33.0 mCi on 12/03/2014            Stress Protocol Rest HR: 64 Stress HR: 166  Rest BP: 131/64 Stress BP: 192/78  Exercise Time (min): 5:30 METS: 7.0   Predicted Max HR: 164 bpm % Max HR: 101.22 bpm Rate Pressure Product: 29528   Dose of Adenosine (mg):  n/a Dose of Lexiscan: n/a mg  Dose of Atropine (mg): n/a Dose of Dobutamine: n/a mcg/kg/min (at max HR)  Stress Test Technologist: Glade Lloyd, BS-ES  Nuclear Technologist:  Earl Many, CNMT     Rest Procedure:  Myocardial perfusion imaging was performed at rest 45 minutes following the intravenous administration of Technetium 53m Sestamibi. Rest ECG: NSR  64 bpm   Stress Procedure:  The patient exercised on the treadmill utilizing the Bruce Protocol for 5:30 minutes. The patient stopped due to  4/10 chest tightness and ST changes.  After 5-6 minutes in recovery the patient stated her tightness had resolved.  Technetium 84m Sestamibi was injected at peak exercise and myocardial perfusion imaging was performed after a brief delay.  Stress ECG: 1-2 mm ST depression in inferior and lateral leads in Stage 2, near normalized by 2 min recovery butwith biphasic T waves   Note extensive motion during exercise    QPS Raw Data Images: Extensive soft tissue (breast, diaphragm, bowel activity) surrounds heart.   Stress Images:  Small defect in the distal anteroseptal and apical walls  Otherwise normal perfusion.   Rest Images:  Normal perfusion Subtraction (SDS):  Small region of ischemia   Transient Ischemic Dilatation (Normal <1.22):  1.20 Lung/Heart Ratio (Normal <0.45):  0.48  Quantitative Gated Spect Images QGS EDV:  85 ml QGS ESV:  29 ml  Impression Exercise Capacity:  Fair exercise capacity. BP Response:  Question hypotensive response   Clinical Symptoms:  Mild chest pain/dyspnea. ECG Impression:  Significant ST abnormalities consistent with ischemia. Comparison with Prior Nuclear Study: Previous scan in 2012 normal    Overall Impression: Clinically and electrically positive for ischemia.  Myoview scan with small region of mild ischemia in the distal anteroseptal and apical walls   Cannot completely exclude some shifting soft tissue (breast) Overall ntermediate  risk test.      LV Ejection Fraction: 65%.  LV Wall Motion:  NL LV Function; NL Wall Motion  Dorris Carnes

## 2014-12-04 ENCOUNTER — Encounter: Payer: Self-pay | Admitting: Family Medicine

## 2014-12-06 ENCOUNTER — Other Ambulatory Visit: Payer: Self-pay | Admitting: *Deleted

## 2014-12-06 MED ORDER — HYDROCODONE-ACETAMINOPHEN 5-325 MG PO TABS: 1.0000 | ORAL_TABLET | Freq: Four times a day (QID) | ORAL | Status: DC | PRN

## 2014-12-06 MED ORDER — HYDROCODONE-ACETAMINOPHEN 5-325 MG PO TABS
1.0000 | ORAL_TABLET | Freq: Four times a day (QID) | ORAL | Status: DC | PRN
Start: 2014-12-06 — End: 2014-12-06

## 2014-12-09 ENCOUNTER — Ambulatory Visit (HOSPITAL_COMMUNITY): Payer: 59 | Attending: Cardiology

## 2014-12-09 ENCOUNTER — Other Ambulatory Visit: Payer: Self-pay | Admitting: Family Medicine

## 2014-12-09 DIAGNOSIS — R0989 Other specified symptoms and signs involving the circulatory and respiratory systems: Secondary | ICD-10-CM

## 2014-12-09 LAB — HEPATIC FUNCTION PANEL
ALK PHOS: 79 U/L (ref 39–117)
ALT: 18 U/L (ref 0–35)
AST: 16 U/L (ref 0–37)
Albumin: 4 g/dL (ref 3.5–5.2)
BILIRUBIN INDIRECT: 0.4 mg/dL (ref 0.2–1.2)
BILIRUBIN TOTAL: 0.5 mg/dL (ref 0.2–1.2)
Bilirubin, Direct: 0.1 mg/dL (ref 0.0–0.3)
TOTAL PROTEIN: 6.8 g/dL (ref 6.0–8.3)

## 2014-12-09 LAB — BASIC METABOLIC PANEL
BUN: 12 mg/dL (ref 6–23)
CALCIUM: 9.4 mg/dL (ref 8.4–10.5)
CHLORIDE: 104 meq/L (ref 96–112)
CO2: 27 meq/L (ref 19–32)
CREATININE: 0.88 mg/dL (ref 0.50–1.10)
GLUCOSE: 173 mg/dL — AB (ref 70–99)
Potassium: 4.4 mEq/L (ref 3.5–5.3)
SODIUM: 141 meq/L (ref 135–145)

## 2014-12-09 LAB — LIPID PANEL
CHOL/HDL RATIO: 3.6 ratio
CHOLESTEROL: 156 mg/dL (ref 0–200)
HDL: 43 mg/dL (ref >39)
LDL CALC: 94 mg/dL (ref 0–99)
Triglycerides: 97 mg/dL (ref <150)
VLDL: 19 mg/dL (ref 0–40)

## 2014-12-09 LAB — HEMOGLOBIN A1C
HEMOGLOBIN A1C: 9.3 % — AB (ref <5.7)
Mean Plasma Glucose: 220 mg/dL — ABNORMAL HIGH (ref <117)

## 2014-12-09 MED ORDER — TECHNETIUM TC 99M SESTAMIBI GENERIC - CARDIOLITE
30.0000 | Freq: Once | INTRAVENOUS | Status: AC | PRN
  Administered 2014-12-09: 30 via INTRAVENOUS

## 2014-12-10 LAB — VITAMIN D 25 HYDROXY (VIT D DEFICIENCY, FRACTURES): Vit D, 25-Hydroxy: 30 ng/mL (ref 30–100)

## 2014-12-13 ENCOUNTER — Encounter: Payer: Self-pay | Admitting: Cardiology

## 2014-12-13 ENCOUNTER — Encounter: Payer: Self-pay | Admitting: Family Medicine

## 2014-12-13 NOTE — Telephone Encounter (Signed)
#  1, please talk with Jamie Harding. We certainly want to be of assistance but Because of [name]'s age he needs to call us so that we can send in his prescription. Also he will need office visit in the near future but this would be best handled by [facility] [place] speaking directly to our nursing staff.

## 2014-12-13 NOTE — Telephone Encounter (Signed)
**Note De-Identified  Obfuscation** Jamie Harding, I attempted to reach you this morning with your results at your home number but was unable to reach you. Dr Ron Parker was out of town all last week but he did review your test results yesterday and his remarks are as follows:  Please notify the patient that I have reviewed carefully the result of her nuclear stress test. There may be a mild change from the past. She should continue her usual activities. I will discuss the results with her completely when she returns to the office.  You are scheduled to see Dr Ron Parker on 12/23/14 @ 8:30 am.  If you have anymore questions or concerns please call me at [phone] or through [employer].  Thanks, Jeani Hawking (Dr Kae Heller nurse)

## 2014-12-14 ENCOUNTER — Telehealth: Payer: Self-pay | Admitting: Family Medicine

## 2014-12-14 NOTE — Telephone Encounter (Signed)
It is not unusual for medications to be denied. Nurses, please send in Januvia 50 mg 1 twice daily. #60, 6 refills. Explained to the patient it works the same way as the other medicine that I prescribed. Please delete Trajenta from her list

## 2014-12-14 NOTE — Telephone Encounter (Signed)
Pt's linagliptin (TRADJENTA) 5 MG TABS tablet was DENIED, please see denial in red folder, please advise on appeal or change medication

## 2014-12-15 ENCOUNTER — Telehealth: Payer: Self-pay | Admitting: *Deleted

## 2014-12-15 ENCOUNTER — Other Ambulatory Visit: Payer: Self-pay | Admitting: *Deleted

## 2014-12-15 MED ORDER — SITAGLIPTIN PHOSPHATE 100 MG PO TABS: 100.0000 mg | ORAL_TABLET | Freq: Every day | ORAL | Status: DC

## 2014-12-15 MED ORDER — SITAGLIPTIN PHOSPHATE 50 MG PO TABS: 50.0000 mg | ORAL_TABLET | Freq: Two times a day (BID) | ORAL | Status: DC

## 2014-12-15 NOTE — Telephone Encounter (Signed)
Discussed with patient

## 2014-12-15 NOTE — Telephone Encounter (Signed)
Medication sent to pharmacy. Called patient on home # and it keeps saying enter mailbox #.

## 2014-12-15 NOTE — Telephone Encounter (Signed)
[facility] called and states insurance will not pay for januvia 50mg  one bid but will pay for 100mg  once daily. Pill cannot be split. Consult with dr Nicki Reaper and ok to change to 100mg  once daily. New script sent to pharm. [facility] to notify pt.

## 2014-12-23 ENCOUNTER — Encounter: Payer: Self-pay | Admitting: Cardiology

## 2014-12-23 ENCOUNTER — Ambulatory Visit (INDEPENDENT_AMBULATORY_CARE_PROVIDER_SITE_OTHER): Payer: 59 | Admitting: Cardiology

## 2014-12-23 VITALS — BP 136/72 | HR 81 | Ht 62.0 in | Wt 235.6 lb

## 2014-12-23 DIAGNOSIS — R0609 Other forms of dyspnea: Secondary | ICD-10-CM

## 2014-12-23 DIAGNOSIS — R0789 Other chest pain: Secondary | ICD-10-CM

## 2014-12-23 DIAGNOSIS — Z01812 Encounter for preprocedural laboratory examination: Secondary | ICD-10-CM

## 2014-12-23 DIAGNOSIS — E785 Hyperlipidemia, unspecified: Secondary | ICD-10-CM

## 2014-12-23 DIAGNOSIS — R0602 Shortness of breath: Secondary | ICD-10-CM

## 2014-12-23 DIAGNOSIS — I10 Essential (primary) hypertension: Secondary | ICD-10-CM

## 2014-12-23 DIAGNOSIS — R9439 Abnormal result of other cardiovascular function study: Secondary | ICD-10-CM

## 2014-12-23 DIAGNOSIS — Z8673 Personal history of transient ischemic attack (TIA), and cerebral infarction without residual deficits: Secondary | ICD-10-CM | POA: Insufficient documentation

## 2014-12-23 LAB — BASIC METABOLIC PANEL
BUN: 12 mg/dL (ref 6–23)
CHLORIDE: 104 meq/L (ref 96–112)
CO2: 28 mEq/L (ref 19–32)
CREATININE: 0.9 mg/dL (ref 0.40–1.20)
Calcium: 9.4 mg/dL (ref 8.4–10.5)
GFR: 68.73 mL/min (ref >60.00)
GLUCOSE: 302 mg/dL — AB (ref 70–99)
Potassium: 4.4 mEq/L (ref 3.5–5.1)
Sodium: 137 mEq/L (ref 135–145)

## 2014-12-23 LAB — PROTIME-INR
INR: 0.9 ratio (ref 0.8–1.0)
Prothrombin Time: 10.5 s (ref 9.6–13.1)

## 2014-12-23 LAB — CBC WITH DIFFERENTIAL/PLATELET
Basophils Absolute: 0 10*3/uL (ref 0.0–0.1)
Basophils Relative: 0.6 % (ref 0.0–3.0)
EOS ABS: 0.3 10*3/uL (ref 0.0–0.7)
Eosinophils Relative: 3.8 % (ref 0.0–5.0)
HEMATOCRIT: 40.8 % (ref 36.0–46.0)
Hemoglobin: 14.1 g/dL (ref 12.0–15.0)
LYMPHS ABS: 2.4 10*3/uL (ref 0.7–4.0)
LYMPHS PCT: 28.9 % (ref 12.0–46.0)
MCHC: 34.5 g/dL (ref 30.0–36.0)
MCV: 83.2 fl (ref 78.0–100.0)
Monocytes Absolute: 0.3 10*3/uL (ref 0.1–1.0)
Monocytes Relative: 4 % (ref 3.0–12.0)
NEUTROS PCT: 62.7 % (ref 43.0–77.0)
Neutro Abs: 5.1 10*3/uL (ref 1.4–7.7)
Platelets: 274 10*3/uL (ref 150.0–400.0)
RBC: 4.9 Mil/uL (ref 3.87–5.11)
RDW: 13 % (ref 11.5–15.5)
WBC: 8.1 10*3/uL (ref 4.0–10.5)

## 2014-12-23 NOTE — Progress Notes (Signed)
Cardiology Office Note   Date:  03/23/7321   ID:  Jamie Harding, DOB [birth year], MRN [id number]  PCP:  Jamie Lange, MD  Cardiologist:  [name], MD   Chief Complaint  Patient presents with  . Appointment    Follow-up shortness of breath      History of Present Illness: Jamie Harding is a 59 y.o. female who presents to follow-up shortness of breath and chest tightness. I saw her last November 29, 2014. She underwent nuclear stress test on December 03, 2014. There was a small area of apical ischemia. It is possible that this could be due to shifting soft tissue attenuation. However EKGs were abnormal also. Overall I feel that the nuclear study does raise the question that her exertional shortness of breath and tightness is related to ischemia. She continues to have this symptom. She has not had any rest symptoms.  The patient also mentioned to me today that she had a CVA in 2008. I have now researched the prior information. She had some limited right-sided weakness in 2008. Carotid Dopplers were normal. MRI of the head did show a small pontine infarct. The description in the report suggests that the exact etiology was not clear. It was nonhemorrhagic. It is mentioned that it could've been due to small vessel disease or vasculitis or migraines. She has been kept on aspirin since then. She has not had any recurrent symptoms since then.  As part of today's evaluation I reviewed extensively her older records.    Past Medical History  Diagnosis Date  . DM (diabetes mellitus)   . HTN (hypertension)   . Obese   . Depression   . Anxiety   . HLD (hyperlipidemia)   . Hiatal hernia   . Asthma   . GERD (gastroesophageal reflux disease)   . Fatty liver   . Gastritis   . Pancreatitis   . Tibialis tendinitis   . Chest pain     Nuclear  February, 2012,,  breast attenuation, no definite scar or ischemia  . Gastroparesis     NEGATIVE GES 2010  . Rheumatoid arthritis(714.0)   . Stroke  2008    no deficits  . Neuropathy   . Ejection fraction     EF 60%, echo, February, 2012, mild LVH  . Family history of anesthesia complication     post anesthesia N/V    Past Surgical History  Procedure Laterality Date  . Cesarean section    . Cholecystectomy    . Ankle surgery      remove extra bone-left  . Finger surgery      left little finger  . Lithotripsy    . Dilation and curettage of uterus      multiple  . Laser ablation of the cervix    . Carpal tunnel release      right side  . Tubal ligation    . Gastric emptying  07/27/2009    The amount of activity in the stomach at 120 minutes was 13% which is in the normal range  . Esophagogastroduodenoscopy  07/29/2009    Mild gastritis, benign path  . Colonoscopy with propofol  09/16/2012    TDV:VOHY sessile polyps ranging between 3-8mm in size were found in the sigmoid colon and rectum; polypectomy was performed/Mild diverticulosis was noted throughout the entire examined colon/Small internal hemorrhoids  . Esophagogastroduodenoscopy (egd) with propofol  09/16/2012    SLF:Non-erosive gastritis (inflammation) was found; multiple bx/The duodenal mucosa showed no abnormalities in  the ampulla and bulb and second portion of the duodenum/NAUSEA/VOMITING MOST LIKELY DUE TO GERD/GASTRITIS  . Savory dilation  09/16/2012    Procedure: SAVORY DILATION;  Surgeon: Danie Binder, MD;  Location: [facility];  Service: Endoscopy;  Laterality: N/A;  16 fr dilation  . Polypectomy  09/16/2012    Procedure: POLYPECTOMY;  Surgeon: Danie Binder, MD;  Location: [facility];  Service: Endoscopy;  Laterality: N/A;  . Carpal tunnel release Left 02/06/2013    Procedure: CARPAL TUNNEL RELEASE ;  Surgeon: Carole Civil, MD;  Location: [facility];  Service: Orthopedics;  Laterality: Left;  procedure end 1135  . Trigger finger release Left 02/06/2013    Procedure: RELEASE TRIGGER FINGER LEFT LONG FINGER/A-1 PULLEY;  Surgeon: Carole Civil, MD;  Location: [facility];  Service: Orthopedics;  Laterality: Left;  procedure began 1136    Patient Active Problem List   Diagnosis Date Noted  . History of CVA (cerebrovascular accident) without residual deficits 12/23/2014  . Dyspnea on exertion 11/29/2014  . Dyspareunia 09/16/2013  . Trigger finger, acquired 02/17/2013  . S/P carpal tunnel release 02/17/2013  . Lumbar pain with radiation down left leg 02/16/2013  . Greater trochanteric bursitis 01/29/2013  . Sinus tarsi syndrome 01/29/2013  . Chest pain   . Ejection fraction   . Dyspepsia 08/28/2012  . Encounter for screening colonoscopy 08/28/2012  . Plantar fasciitis 12/20/2011  . TRIGGER FINGER, LEFT MIDDLE 07/19/2010  . GASTROPARESIS 07/19/2009  . Type 2 diabetes mellitus 07/15/2009  . Hyperlipidemia 07/15/2009  . ANXIETY 07/15/2009  . DEPRESSION 07/15/2009  . Essential hypertension 07/15/2009  . ASTHMA, UNSPECIFIED 07/15/2009  . HIATAL HERNIA 07/15/2009  . Fatty liver 07/15/2009  . DYSPHAGIA UNSPECIFIED 07/15/2009  . PANCREATITIS, HX OF 07/15/2009  . GASTRITIS, HX OF 07/15/2009  . TIBIALIS TENDINITIS 07/12/2008      Current Outpatient Prescriptions  Medication Sig Dispense Refill  . ADVAIR DISKUS 250-50 MCG/DOSE AEPB INHALE 1 PUFF INTO THE LUNGS 2 (TWO) TIMES DAILY. 60 each 5  . albuterol (PROVENTIL HFA;VENTOLIN HFA) 108 (90 BASE) MCG/ACT inhaler Inhale 2 puffs into the lungs every 4 (four) hours as needed for wheezing. 3 Inhaler 0  . ALPRAZolam (XANAX) 0.5 MG tablet Take 1 tablet (0.5 mg total) by mouth 2 (two) times daily as needed. For anxiety 60 tablet 4  . amLODipine (NORVASC) 10 MG tablet Take 1 tablet (10 mg total) by mouth daily. 30 tablet 0  . aspirin EC 81 MG tablet Take 81 mg by mouth daily.    . cetirizine (ZYRTEC) 10 MG tablet Take 1 tablet (10 mg total) by mouth daily. 30 tablet 6  . cyanocobalamin 1000 MCG tablet Take 1,000 mcg by mouth daily.     . fluconazole (DIFLUCAN) 150 MG tablet TAKE ONE WEEKLY 6 tablet 5  .  fluticasone (FLONASE) 50 MCG/ACT nasal spray USE 2 SPRAYS IN EACH NOSTRIL DAILY 16 g 5  . furosemide (LASIX) 20 MG tablet Take 1 tablet (20 mg total) by mouth daily. 90 tablet 0  . gabapentin (NEURONTIN) 300 MG capsule one tablet q am, one tablet q afternoon and 2 tabs q evening 360 capsule 1  . glucose blood test strip Use as instructed 100 each 12  . HYDROcodone-acetaminophen (NORCO/VICODIN) 5-325 MG per tablet Take 1 tablet by mouth every 6 (six) hours as needed. 120 tablet 0  . Insulin Glargine (LANTUS SOLOSTAR) 100 UNIT/ML Solostar Pen INJECT 100 UNITS EVERY NIGHT AT BEDTIME 15 mL 0  . insulin glargine (  LANTUS) 100 UNIT/ML injection Inject 100 Units into the skin at bedtime.    Marland Kitchen lisinopril (PRINIVIL,ZESTRIL) 20 MG tablet TAKE 1 TABLET BY MOUTH DAILY 90 tablet 0  . metFORMIN (GLUCOPHAGE) 1000 MG tablet TAKE 1 TABLET BY MOUTH TWICE DAILY 180 tablet 1  . methocarbamol (ROBAXIN) 500 MG tablet TAKE 1 TABLET BY MOUTH TWICE DAILY (Patient taking differently: TAKE 1 TABLET BY MOUTH AS NEEDED) 180 tablet 3  . Multiple Vitamin (MULTIVITAMIN WITH MINERALS) TABS Take 1 tablet by mouth daily.    Marland Kitchen NOVOLOG FLEXPEN 100 UNIT/ML FlexPen USE THREE TIMES A DAY UNDER THE SKIN PER SLIDING SCALE (UP TO 15 UNITS AT A TIME 15 mL 3  . ondansetron (ZOFRAN) 8 MG tablet TAKE 1 TABLET BY MOUTH EVERY 12 HOURS AS NEEDED FOR NAUSEA 20 tablet 2  . pantoprazole (PROTONIX) 40 MG tablet Take 1 tablet (40 mg total) by mouth 2 (two) times daily. 180 tablet 3  . promethazine (PHENERGAN) 25 MG tablet Take 1 tablet (25 mg total) by mouth every 8 (eight) hours as needed for nausea. 30 tablet 3  . pyridOXINE (VITAMIN B-6) 100 MG tablet Take 100 mg by mouth daily.    . rosuvastatin (CRESTOR) 10 MG tablet Take 1 tablet (10 mg total) by mouth daily. 90 tablet 1  . sitaGLIPtin (JANUVIA) 100 MG tablet Take 1 tablet (100 mg total) by mouth daily. 30 tablet 5  . UNIFINE PENTIPS 31G X 8 MM MISC USE AS DIRECTED THREE TIMES A DAY AS NEEDED 300  each 5  . venlafaxine (EFFEXOR) 75 MG tablet Take 1 tablet (75 mg total) by mouth 2 (two) times daily with a meal. 180 tablet 3   No current facility-administered medications for this visit.    Allergies:   Byetta 10 mcg pen; Naproxen; Augmentin; Azithromycin; Erythromycin; Invokana; Morphine; and Sulfonamide derivatives    Social History:  The patient  reports that she quit smoking about 27 years ago. Her smoking use included Cigarettes. She has a 1.5 pack-year smoking history. She does not have any smokeless tobacco history on file. She reports that she does not drink alcohol or use illicit drugs.   Family History:  The patient's family history includes Arthritis in an other family member; Asthma in an other family member; Breast cancer in her mother; Coronary artery disease in an other family member; Diabetes in her father and sister. There is no history of Colon cancer.    ROS:  Please see the history of present illness.   Patient denies fever, chills, headache, sweats, rash, change in vision, change in hearing, cough, nausea or vomiting, urinary symptoms. All other systems are reviewed and are negative.    PHYSICAL EXAM: VS:  BP 136/72 mmHg  Pulse 81  Ht 5\' 2"  (1.575 m)  Wt 235 lb 9.6 oz (106.867 kg)  BMI 43.08 kg/m2 , Patient is stable. She is overweight. She is oriented to person time and place. Affect is normal. Head is atraumatic. Sclera and conjunctiva are normal. There is no jugular venous distention. Lungs are clear. Respiratory effort is nonlabored. Cardiac exam reveals S1 and S2. The abdomen is soft. There is no peripheral edema. There are no musculoskeletal deformities. There are no skin rashes. Neurologic is grossly intact.  EKG:   EKG is not done today.   Recent Labs: 12/09/2014: ALT 18; BUN 12; Creatinine 0.88; Potassium 4.4; Sodium 141    Lipid Panel    Component Value Date/Time   CHOL 156 12/09/2014 0940  TRIG 97 12/09/2014 0940   HDL 43 12/09/2014 0940    CHOLHDL 3.6 12/09/2014 0940   VLDL 19 12/09/2014 0940   LDLCALC 94 12/09/2014 0940      Wt Readings from Last 3 Encounters:  12/23/14 235 lb 9.6 oz (106.867 kg)  12/03/14 230 lb (104.327 kg)  12/02/14 234 lb (106.142 kg)      Current medicines are reviewed. Today we did not change her medicines. She has no questions about her medicines.     ASSESSMENT AND PLAN:

## 2014-12-23 NOTE — Assessment & Plan Note (Signed)
At this time I'm concerned that her exertional shortness of breath and chest tightness represents ischemia. EKGs are abnormal with her stress test. There is mild apical ischemia. I feel that it is appropriate to proceed now with cardiac catheterization. Left ventricular function is normal by nuclear study. Two-dimensional echo will be done to be sure that there is no occult valvular disease. In addition this will help assess right ventricular function. Catheterization is being arranged. I am setting up as right and left heart cath at this point. I had a long and careful discussion with the patient about the indication for catheterization. We reviewed the risks and benefits completely. She is very interested in proceeding. Arrangements are being made.  As part of today's evaluation I've spent greater than 45 minutes on her total care. More than half of this time has been with direct contact counseling her and talking with her about her prior stroke and about catheterization. I also made arrangements for her catheterization and have written cardiac cath orders.

## 2014-12-23 NOTE — H&P (Signed)
12/23/2014 8:30 AM  Office Visit  MRN:  [id number]   Description: Female DOB: 27-Jul-1956  Provider: Carlena Bjornstad, MD  Department: [facility]       Vital Signs  Most recent update: 12/23/2014 8:35 AM by Claude Manges, CMA    BP Pulse Ht Wt BMI    136/72 mmHg 81 5\' 2"  (1.575 m) 235 lb 9.6 oz (106.867 kg) 43.08 kg/m2    Vitals History     Progress Notes      Carlena Bjornstad, MD at 12/23/2014 8:54 AM     Status: Sign at close encounter       Expand All Collapse All      Cardiology Office Note   Date: 04/25/2093   ID: RHIANNA RAULERSON, DOB [birth year], MRN [id number]  PCP: [name], MD Cardiologist: [name], MD   Chief Complaint  Patient presents with  . Appointment    Follow-up shortness of breath     History of Present Illness: JESSELLE LAFLAMME is a 59 y.o. female who presents to follow-up shortness of breath and chest tightness. I saw her last November 29, 2014. She underwent nuclear stress test on December 03, 2014. There was a small area of apical ischemia. It is possible that this could be due to shifting soft tissue attenuation. However EKGs were abnormal also. Overall I feel that the nuclear study does raise the question that her exertional shortness of breath and tightness is related to ischemia. She continues to have this symptom. She has not had any rest symptoms.  The patient also mentioned to me today that she had a CVA in 2008. I have now researched the prior information. She had some limited right-sided weakness in 2008. Carotid Dopplers were normal. MRI of the head did show a small pontine infarct. The description in the report suggests that the exact etiology was not clear. It was nonhemorrhagic. It is mentioned that it could've been due to small vessel disease or vasculitis or migraines. She has been kept on aspirin since then. She has not had any recurrent symptoms since then.  As part of today's evaluation I reviewed  extensively her older records.    Past Medical History  Diagnosis Date  . DM (diabetes mellitus)   . HTN (hypertension)   . Obese   . Depression   . Anxiety   . HLD (hyperlipidemia)   . Hiatal hernia   . Asthma   . GERD (gastroesophageal reflux disease)   . Fatty liver   . Gastritis   . Pancreatitis   . Tibialis tendinitis   . Chest pain     Nuclear February, 2012,, breast attenuation, no definite scar or ischemia  . Gastroparesis     NEGATIVE GES 2010  . Rheumatoid arthritis(714.0)   . Stroke 2008    no deficits  . Neuropathy   . Ejection fraction     EF 60%, echo, February, 2012, mild LVH  . Family history of anesthesia complication     post anesthesia N/V    Past Surgical History  Procedure Laterality Date  . Cesarean section    . Cholecystectomy    . Ankle surgery      remove extra bone-left  . Finger surgery      left little finger  . Lithotripsy    . Dilation and curettage of uterus      multiple  . Laser ablation of the cervix    . Carpal tunnel release  right side  . Tubal ligation    . Gastric emptying  07/27/2009    The amount of activity in the stomach at 120 minutes was 13% which is in the normal range  . Esophagogastroduodenoscopy  07/29/2009    Mild gastritis, benign path  . Colonoscopy with propofol  09/16/2012    ZJI:RCVE sessile polyps ranging between 3-30mm in size were found in the sigmoid colon and rectum; polypectomy was performed/Mild diverticulosis was noted throughout the entire examined colon/Small internal hemorrhoids  . Esophagogastroduodenoscopy (egd) with propofol  09/16/2012    SLF:Non-erosive gastritis (inflammation) was found; multiple bx/The duodenal mucosa showed no abnormalities in the ampulla and bulb and second portion of the duodenum/NAUSEA/VOMITING MOST LIKELY  DUE TO GERD/GASTRITIS  . Savory dilation  09/16/2012    Procedure: SAVORY DILATION; Surgeon: Danie Binder, MD; Location: [facility]; Service: Endoscopy; Laterality: N/A; 16 fr dilation  . Polypectomy  09/16/2012    Procedure: POLYPECTOMY; Surgeon: Danie Binder, MD; Location: [facility]; Service: Endoscopy; Laterality: N/A;  . Carpal tunnel release Left 02/06/2013    Procedure: CARPAL TUNNEL RELEASE ; Surgeon: Carole Civil, MD; Location: [facility]; Service: Orthopedics; Laterality: Left; procedure end 1135  . Trigger finger release Left 02/06/2013    Procedure: RELEASE TRIGGER FINGER LEFT LONG FINGER/A-1 PULLEY; Surgeon: Carole Civil, MD; Location: [facility]; Service: Orthopedics; Laterality: Left; procedure began 1136    Patient Active Problem List   Diagnosis Date Noted  . History of CVA (cerebrovascular accident) without residual deficits 12/23/2014  . Dyspnea on exertion 11/29/2014  . Dyspareunia 09/16/2013  . Trigger finger, acquired 02/17/2013  . S/P carpal tunnel release 02/17/2013  . Lumbar pain with radiation down left leg 02/16/2013  . Greater trochanteric bursitis 01/29/2013  . Sinus tarsi syndrome 01/29/2013  . Chest pain   . Ejection fraction   . Dyspepsia 08/28/2012  . Encounter for screening colonoscopy 08/28/2012  . Plantar fasciitis 12/20/2011  . TRIGGER FINGER, LEFT MIDDLE 07/19/2010  . GASTROPARESIS 07/19/2009  . Type 2 diabetes mellitus 07/15/2009  . Hyperlipidemia 07/15/2009  . ANXIETY 07/15/2009  . DEPRESSION 07/15/2009  . Essential hypertension 07/15/2009  . ASTHMA, UNSPECIFIED 07/15/2009  . HIATAL HERNIA 07/15/2009  . Fatty liver 07/15/2009  . DYSPHAGIA UNSPECIFIED 07/15/2009  . PANCREATITIS, HX OF 07/15/2009  . GASTRITIS, HX OF 07/15/2009  . TIBIALIS TENDINITIS 07/12/2008      Current Outpatient Prescriptions    Medication Sig Dispense Refill  . ADVAIR DISKUS 250-50 MCG/DOSE AEPB INHALE 1 PUFF INTO THE LUNGS 2 (TWO) TIMES DAILY. 60 each 5  . albuterol (PROVENTIL HFA;VENTOLIN HFA) 108 (90 BASE) MCG/ACT inhaler Inhale 2 puffs into the lungs every 4 (four) hours as needed for wheezing. 3 Inhaler 0  . ALPRAZolam (XANAX) 0.5 MG tablet Take 1 tablet (0.5 mg total) by mouth 2 (two) times daily as needed. For anxiety 60 tablet 4  . amLODipine (NORVASC) 10 MG tablet Take 1 tablet (10 mg total) by mouth daily. 30 tablet 0  . aspirin EC 81 MG tablet Take 81 mg by mouth daily.    . cetirizine (ZYRTEC) 10 MG tablet Take 1 tablet (10 mg total) by mouth daily. 30 tablet 6  . cyanocobalamin 1000 MCG tablet Take 1,000 mcg by mouth daily.     . fluconazole (DIFLUCAN) 150 MG tablet TAKE ONE WEEKLY 6 tablet 5  . fluticasone (FLONASE) 50 MCG/ACT nasal spray USE 2 SPRAYS IN EACH NOSTRIL DAILY 16 g 5  . furosemide (LASIX) 20 MG tablet Take  1 tablet (20 mg total) by mouth daily. 90 tablet 0  . gabapentin (NEURONTIN) 300 MG capsule one tablet q am, one tablet q afternoon and 2 tabs q evening 360 capsule 1  . glucose blood test strip Use as instructed 100 each 12  . HYDROcodone-acetaminophen (NORCO/VICODIN) 5-325 MG per tablet Take 1 tablet by mouth every 6 (six) hours as needed. 120 tablet 0  . Insulin Glargine (LANTUS SOLOSTAR) 100 UNIT/ML Solostar Pen INJECT 100 UNITS EVERY NIGHT AT BEDTIME 15 mL 0  . insulin glargine (LANTUS) 100 UNIT/ML injection Inject 100 Units into the skin at bedtime.    Marland Kitchen lisinopril (PRINIVIL,ZESTRIL) 20 MG tablet TAKE 1 TABLET BY MOUTH DAILY 90 tablet 0  . metFORMIN (GLUCOPHAGE) 1000 MG tablet TAKE 1 TABLET BY MOUTH TWICE DAILY 180 tablet 1  . methocarbamol (ROBAXIN) 500 MG tablet TAKE 1 TABLET BY MOUTH TWICE DAILY (Patient taking differently: TAKE 1 TABLET BY MOUTH AS NEEDED) 180 tablet 3  . Multiple  Vitamin (MULTIVITAMIN WITH MINERALS) TABS Take 1 tablet by mouth daily.    Marland Kitchen NOVOLOG FLEXPEN 100 UNIT/ML FlexPen USE THREE TIMES A DAY UNDER THE SKIN PER SLIDING SCALE (UP TO 15 UNITS AT A TIME 15 mL 3  . ondansetron (ZOFRAN) 8 MG tablet TAKE 1 TABLET BY MOUTH EVERY 12 HOURS AS NEEDED FOR NAUSEA 20 tablet 2  . pantoprazole (PROTONIX) 40 MG tablet Take 1 tablet (40 mg total) by mouth 2 (two) times daily. 180 tablet 3  . promethazine (PHENERGAN) 25 MG tablet Take 1 tablet (25 mg total) by mouth every 8 (eight) hours as needed for nausea. 30 tablet 3  . pyridOXINE (VITAMIN B-6) 100 MG tablet Take 100 mg by mouth daily.    . rosuvastatin (CRESTOR) 10 MG tablet Take 1 tablet (10 mg total) by mouth daily. 90 tablet 1  . sitaGLIPtin (JANUVIA) 100 MG tablet Take 1 tablet (100 mg total) by mouth daily. 30 tablet 5  . UNIFINE PENTIPS 31G X 8 MM MISC USE AS DIRECTED THREE TIMES A DAY AS NEEDED 300 each 5  . venlafaxine (EFFEXOR) 75 MG tablet Take 1 tablet (75 mg total) by mouth 2 (two) times daily with a meal. 180 tablet 3   No current facility-administered medications for this visit.    Allergies: Byetta 10 mcg pen; Naproxen; Augmentin; Azithromycin; Erythromycin; Invokana; Morphine; and Sulfonamide derivatives    Social History: The patient  reports that she quit smoking about 27 years ago. Her smoking use included Cigarettes. She has a 1.5 pack-year smoking history. She does not have any smokeless tobacco history on file. She reports that she does not drink alcohol or use illicit drugs.   Family History: The patient's family history includes Arthritis in an other family member; Asthma in an other family member; Breast cancer in her mother; Coronary artery disease in an other family member; Diabetes in her father and sister. There is no history of Colon cancer.    ROS: Please see the history of present illness. Patient denies fever, chills,  headache, sweats, rash, change in vision, change in hearing, cough, nausea or vomiting, urinary symptoms. All other systems are reviewed and are negative.    PHYSICAL EXAM: VS: BP 136/72 mmHg  Pulse 81  Ht 5\' 2"  (1.575 m)  Wt 235 lb 9.6 oz (106.867 kg)  BMI 43.08 kg/m2 , Patient is stable. She is overweight. She is oriented to person time and place. Affect is normal. Head is atraumatic. Sclera and conjunctiva are normal.  There is no jugular venous distention. Lungs are clear. Respiratory effort is nonlabored. Cardiac exam reveals S1 and S2. The abdomen is soft. There is no peripheral edema. There are no musculoskeletal deformities. There are no skin rashes. Neurologic is grossly intact.  EKG: EKG is not done today.   Recent Labs: 12/09/2014: ALT 18; BUN 12; Creatinine 0.88; Potassium 4.4; Sodium 141    Lipid Panel  Labs (Brief)       Component Value Date/Time   CHOL 156 12/09/2014 0940   TRIG 97 12/09/2014 0940   HDL 43 12/09/2014 0940   CHOLHDL 3.6 12/09/2014 0940   VLDL 19 12/09/2014 0940   LDLCALC 94 12/09/2014 0940       Wt Readings from Last 3 Encounters:  12/23/14 235 lb 9.6 oz (106.867 kg)  12/03/14 230 lb (104.327 kg)  12/02/14 234 lb (106.142 kg)      Current medicines are reviewed. Today we did not change her medicines. She has no questions about her medicines.     ASSESSMENT AND PLAN:              Dyspnea on exertion - Carlena Bjornstad, MD at 12/23/2014 9:25 AM     Status: Written Related Problem: Dyspnea on exertion   Expand All Collapse All   At this time I'm concerned that her exertional shortness of breath and chest tightness represents ischemia. EKGs are abnormal with her stress test. There is mild apical ischemia. I feel that it is appropriate to proceed now with cardiac catheterization. Left ventricular function is normal by nuclear study. Two-dimensional echo will be done to be sure that there is  no occult valvular disease. In addition this will help assess right ventricular function. Catheterization is being arranged. I am setting up as right and left heart cath at this point. I had a long and careful discussion with the patient about the indication for catheterization. We reviewed the risks and benefits completely. She is very interested in proceeding. Arrangements are being made.  As part of today's evaluation I've spent greater than 45 minutes on her total care. More than half of this time has been with direct contact counseling her and talking with her about her prior stroke and about catheterization. I also made arrangements for her catheterization and have written cardiac cath orders.            Chest tightness - Carlena Bjornstad, MD at 12/23/2014 9:26 AM     Status: Written Related Problem: Chest tightness   Expand All Collapse All   She has chest tightness when she has shortness of breath with exertion. It is now time to proceed with cardiac catheterization for further assessment.            Hyperlipidemia - Carlena Bjornstad, MD at 12/23/2014 9:27 AM     Status: Written Related Problem: Hyperlipidemia   Expand All Collapse All   She is receiving a statin for her lipids.            History of CVA (cerebrovascular accident) without residual deficits - Carlena Bjornstad, MD at 12/23/2014 9:29 AM     Status: Written Related Problem: History of CVA (cerebrovascular accident) without residual deficits   Expand All Collapse All   I learned about the CVA that she had in 2008 when talking with her today. The exact etiology is not clear. She has not had documented atrial fibrillation over time. Carotid Dopplers at that time showed no marked abnormalities. The  MRI at that time showed an abnormality in the pontine area. The exact etiology was not clear. She's been treated with aspirin since then and has had no recurring symptoms.         Daryel November, MD

## 2014-12-23 NOTE — Patient Instructions (Addendum)
Your physician recommends that you continue on your current medications as directed. Please refer to the Current Medication list given to you today.  Your physician recommends that you return for lab work in: today (BMET, CBCD and INR)  Your physician has requested that you have an echocardiogram. Echocardiography is a painless test that uses sound waves to create images of your heart. It provides your doctor with information about the size and shape of your heart and how well your heart's chambers and valves are working. This procedure takes approximately one hour. There are no restrictions for this procedure.   Your physician has requested that you have a cardiac catheterization. Cardiac catheterization is used to diagnose and/or treat various heart conditions. Doctors may recommend this procedure for a number of different reasons. The most common reason is to evaluate chest pain. Chest pain can be a symptom of coronary artery disease (CAD), and cardiac catheterization can show whether plaque is narrowing or blocking your heart's arteries. This procedure is also used to evaluate the valves, as well as measure the blood flow and oxygen levels in different parts of your heart. For further information please visit [email or web address]. Please follow instruction sheet, as given.  Your physician recommends that you schedule a follow-up appointment in: 4 weeks

## 2014-12-23 NOTE — Assessment & Plan Note (Signed)
She has chest tightness when she has shortness of breath with exertion. It is now time to proceed with cardiac catheterization for further assessment.

## 2014-12-23 NOTE — Assessment & Plan Note (Signed)
I learned about the CVA that she had in 2008 when talking with her today. The exact etiology is not clear. She has not had documented atrial fibrillation over time. Carotid Dopplers at that time showed no marked abnormalities. The MRI at that time showed an abnormality in the pontine area. The exact etiology was not clear. She's been treated with aspirin since then and has had no recurring symptoms.

## 2014-12-23 NOTE — Assessment & Plan Note (Signed)
She is receiving a statin for her lipids.

## 2014-12-24 LAB — HM DIABETES EYE EXAM

## 2014-12-29 ENCOUNTER — Other Ambulatory Visit (HOSPITAL_COMMUNITY): Payer: 59

## 2014-12-31 ENCOUNTER — Ambulatory Visit (HOSPITAL_COMMUNITY): Payer: 59 | Attending: Cardiovascular Disease

## 2014-12-31 DIAGNOSIS — R9439 Abnormal result of other cardiovascular function study: Secondary | ICD-10-CM | POA: Insufficient documentation

## 2014-12-31 DIAGNOSIS — R0602 Shortness of breath: Secondary | ICD-10-CM | POA: Diagnosis present

## 2014-12-31 DIAGNOSIS — R0789 Other chest pain: Secondary | ICD-10-CM | POA: Diagnosis not present

## 2014-12-31 NOTE — Progress Notes (Signed)
2D Echo completed. 12/31/2014

## 2015-01-03 ENCOUNTER — Encounter (HOSPITAL_COMMUNITY): Payer: Self-pay | Admitting: Cardiovascular Disease

## 2015-01-03 ENCOUNTER — Ambulatory Visit (HOSPITAL_COMMUNITY)
Admission: RE | Admit: 2015-01-03 | Discharge: 2015-01-03 | Disposition: A | Discharge status: Home / Self Care | Payer: 59 | Source: Ambulatory Visit | Attending: Cardiovascular Disease | Admitting: Cardiovascular Disease

## 2015-01-03 ENCOUNTER — Encounter (HOSPITAL_COMMUNITY): Payer: Self-pay

## 2015-01-03 ENCOUNTER — Other Ambulatory Visit: Payer: Self-pay

## 2015-01-03 ENCOUNTER — Encounter (HOSPITAL_COMMUNITY)
Admission: RE | Disposition: A | Discharge status: Home / Self Care | Payer: Self-pay | Source: Ambulatory Visit | Attending: Cardiovascular Disease

## 2015-01-03 DIAGNOSIS — Z7982 Long term (current) use of aspirin: Secondary | ICD-10-CM | POA: Diagnosis not present

## 2015-01-03 DIAGNOSIS — Z8673 Personal history of transient ischemic attack (TIA), and cerebral infarction without residual deficits: Secondary | ICD-10-CM | POA: Insufficient documentation

## 2015-01-03 DIAGNOSIS — Z87891 Personal history of nicotine dependence: Secondary | ICD-10-CM | POA: Insufficient documentation

## 2015-01-03 DIAGNOSIS — E119 Type 2 diabetes mellitus without complications: Secondary | ICD-10-CM | POA: Diagnosis not present

## 2015-01-03 DIAGNOSIS — E785 Hyperlipidemia, unspecified: Secondary | ICD-10-CM | POA: Insufficient documentation

## 2015-01-03 DIAGNOSIS — M069 Rheumatoid arthritis, unspecified: Secondary | ICD-10-CM | POA: Diagnosis not present

## 2015-01-03 DIAGNOSIS — J45909 Unspecified asthma, uncomplicated: Secondary | ICD-10-CM | POA: Diagnosis not present

## 2015-01-03 DIAGNOSIS — F329 Major depressive disorder, single episode, unspecified: Secondary | ICD-10-CM | POA: Diagnosis not present

## 2015-01-03 DIAGNOSIS — F419 Anxiety disorder, unspecified: Secondary | ICD-10-CM | POA: Insufficient documentation

## 2015-01-03 DIAGNOSIS — K219 Gastro-esophageal reflux disease without esophagitis: Secondary | ICD-10-CM | POA: Insufficient documentation

## 2015-01-03 DIAGNOSIS — Z01812 Encounter for preprocedural laboratory examination: Secondary | ICD-10-CM

## 2015-01-03 DIAGNOSIS — I10 Essential (primary) hypertension: Secondary | ICD-10-CM | POA: Diagnosis not present

## 2015-01-03 DIAGNOSIS — R0602 Shortness of breath: Secondary | ICD-10-CM | POA: Diagnosis present

## 2015-01-03 DIAGNOSIS — I2510 Atherosclerotic heart disease of native coronary artery without angina pectoris: Secondary | ICD-10-CM | POA: Diagnosis not present

## 2015-01-03 HISTORY — PX: LEFT AND RIGHT HEART CATHETERIZATION WITH CORONARY ANGIOGRAM: SHX5449

## 2015-01-03 LAB — POCT I-STAT 3, VENOUS BLOOD GAS (G3P V)
ACID-BASE DEFICIT: 4 mmol/L — AB (ref 0.0–2.0)
ACID-BASE DEFICIT: 4 mmol/L — AB (ref 0.0–2.0)
BICARBONATE: 21.9 meq/L (ref 20.0–24.0)
BICARBONATE: 22 meq/L (ref 20.0–24.0)
O2 SAT: 77 %
O2 Saturation: 78 %
PH VEN: 7.316 — AB (ref 7.250–7.300)
PO2 VEN: 47 mmHg — AB (ref 30.0–45.0)
TCO2: 23 mmol/L (ref 0–100)
TCO2: 23 mmol/L (ref 0–100)
pCO2, Ven: 43.2 mmHg — ABNORMAL LOW (ref 45.0–50.0)
pCO2, Ven: 44.6 mmHg — ABNORMAL LOW (ref 45.0–50.0)
pH, Ven: 7.3 (ref 7.250–7.300)
pO2, Ven: 45 mmHg (ref 30.0–45.0)

## 2015-01-03 LAB — POCT I-STAT 3, ART BLOOD GAS (G3+)
Acid-base deficit: 1 mmol/L (ref 0.0–2.0)
Bicarbonate: 24.7 mEq/L — ABNORMAL HIGH (ref 20.0–24.0)
O2 Saturation: 99 %
PH ART: 7.352 (ref 7.350–7.450)
TCO2: 26 mmol/L (ref 0–100)
pCO2 arterial: 44.5 mmHg (ref 35.0–45.0)
pO2, Arterial: 145 mmHg — ABNORMAL HIGH (ref 80.0–100.0)

## 2015-01-03 LAB — GLUCOSE, CAPILLARY: Glucose-Capillary: 318 mg/dL — ABNORMAL HIGH (ref 70–99)

## 2015-01-03 SURGERY — LEFT AND RIGHT HEART CATHETERIZATION WITH CORONARY ANGIOGRAM

## 2015-01-03 MED ORDER — VERAPAMIL HCL 2.5 MG/ML IV SOLN
INTRAVENOUS | Status: AC
  Filled 2015-01-03: qty 2

## 2015-01-03 MED ORDER — SODIUM CHLORIDE 0.9 % IJ SOLN: 3.0000 mL | INTRAMUSCULAR | Status: DC | PRN

## 2015-01-03 MED ORDER — ONDANSETRON HCL 4 MG/2ML IJ SOLN: 4.0000 mg | Freq: Four times a day (QID) | INTRAMUSCULAR | Status: DC | PRN

## 2015-01-03 MED ORDER — HEPARIN SODIUM (PORCINE) 1000 UNIT/ML IJ SOLN
INTRAMUSCULAR | Status: AC
  Filled 2015-01-03: qty 1

## 2015-01-03 MED ORDER — FENTANYL CITRATE 0.05 MG/ML IJ SOLN
INTRAMUSCULAR | Status: AC
  Filled 2015-01-03: qty 2

## 2015-01-03 MED ORDER — MIDAZOLAM HCL 2 MG/2ML IJ SOLN
INTRAMUSCULAR | Status: AC
  Filled 2015-01-03: qty 2

## 2015-01-03 MED ORDER — INSULIN ASPART 100 UNIT/ML ~~LOC~~ SOLN
8.0000 [IU] | Freq: Once | SUBCUTANEOUS | Status: AC
  Administered 2015-01-03: 8 [IU] via SUBCUTANEOUS

## 2015-01-03 MED ORDER — SODIUM CHLORIDE 0.9 % IV SOLN: 250.0000 mL | INTRAVENOUS | Status: DC | PRN

## 2015-01-03 MED ORDER — SODIUM CHLORIDE 0.9 % IV SOLN
INTRAVENOUS | Status: DC
  Administered 2015-01-03: 07:00:00 via INTRAVENOUS

## 2015-01-03 MED ORDER — SODIUM CHLORIDE 0.9 % IV SOLN: 1.0000 mL/kg/h | INTRAVENOUS | Status: DC

## 2015-01-03 MED ORDER — SODIUM CHLORIDE 0.9 % IJ SOLN: 3.0000 mL | Freq: Two times a day (BID) | INTRAMUSCULAR | Status: DC

## 2015-01-03 MED ORDER — ACETAMINOPHEN 325 MG PO TABS: 650.0000 mg | ORAL_TABLET | ORAL | Status: DC | PRN

## 2015-01-03 MED ORDER — ASPIRIN 81 MG PO CHEW: 81.0000 mg | CHEWABLE_TABLET | ORAL | Status: DC

## 2015-01-03 MED ORDER — INSULIN ASPART 100 UNIT/ML ~~LOC~~ SOLN
SUBCUTANEOUS | Status: AC
  Filled 2015-01-03: qty 1

## 2015-01-03 MED ORDER — HEPARIN (PORCINE) IN NACL 2-0.9 UNIT/ML-% IJ SOLN
INTRAMUSCULAR | Status: AC
  Filled 2015-01-03: qty 1500

## 2015-01-03 MED ORDER — LIDOCAINE HCL (PF) 1 % IJ SOLN
INTRAMUSCULAR | Status: AC
  Filled 2015-01-03: qty 30

## 2015-01-03 NOTE — Interval H&P Note (Signed)
History and Physical Interval Note:  02/09/4009 2:72 AM  Jamie Harding  has presented today for surgery, with the diagnosis of shortness of breath/abnormal stress test  The various methods of treatment have been discussed with the patient and family. After consideration of risks, benefits and other options for treatment, the patient has consented to  Procedure(s): LEFT AND RIGHT HEART CATHETERIZATION WITH CORONARY ANGIOGRAM (N/A) as a surgical intervention .  The patient's history has been reviewed, patient examined, no change in status, stable for surgery.  I have reviewed the patient's chart and labs.  Questions were answered to the patient's satisfaction.    Cath Lab Visit (complete for each Cath Lab visit)  Clinical Evaluation Leading to the Procedure:   ACS: No.  Non-ACS:    Anginal Classification: CCS II  Anti-ischemic medical therapy: Minimal Therapy (1 class of medications)  Non-Invasive Test Results: Low-risk stress test findings: cardiac mortality <1%/year  Prior CABG: No previous CABG       Jamie Harding

## 2015-01-03 NOTE — Discharge Instructions (Signed)
Radial Site Care °Refer to this sheet in the next few weeks. These instructions provide you with information on caring for yourself after your procedure. Your caregiver may also give you more specific instructions. Your treatment has been planned according to current medical practices, but problems sometimes occur. Call your caregiver if you have any problems or questions after your procedure. °HOME CARE INSTRUCTIONS °· You may shower the day after the procedure. Remove the bandage (dressing) and gently wash the site with plain soap and water. Gently pat the site dry. °· Do not apply powder or lotion to the site. °· Do not submerge the affected site in water for 3 to 5 days. °· Inspect the site at least twice daily. °· Do not flex or bend the affected arm for 24 hours. °· No lifting over 5 pounds (2.3 kg) for 5 days after your procedure. °· Do not drive home if you are discharged the same day of the procedure. Have someone else drive you. °· You may drive 24 hours after the procedure unless otherwise instructed by your caregiver. °· Do not operate machinery or power tools for 24 hours. °· A responsible adult should be with you for the first 24 hours after you arrive home. °What to expect: °· Any bruising will usually fade within 1 to 2 weeks. °· Blood that collects in the tissue (hematoma) may be painful to the touch. It should usually decrease in size and tenderness within 1 to 2 weeks. °SEEK IMMEDIATE MEDICAL CARE IF: °· You have unusual pain at the radial site. °· You have redness, warmth, swelling, or pain at the radial site. °· You have drainage (other than a small amount of blood on the dressing). °· You have chills. °· You have a fever or persistent symptoms for more than 72 hours. °· You have a fever and your symptoms suddenly get worse. °· Your arm becomes pale, cool, tingly, or numb. °· You have heavy bleeding from the site. Hold pressure on the site and call 911. °Document Released: 12/08/2010 Document  Revised: 01/28/2012 Document Reviewed: 12/08/2010 °ExitCare® Patient Information ©2015 [employer]. This information is not intended to replace advice given to you by your health care provider. Make sure you discuss any questions you have with your health care provider. ° °

## 2015-01-03 NOTE — H&P (View-Only) (Signed)
12/23/2014 8:30 AM  Office Visit  MRN:  [id number]   Description: Female DOB: April 30, 1956  Provider: Carlena Bjornstad, MD  Department: [facility] [address] [facility]       Vital Signs  Most recent update: 12/23/2014 8:35 AM by Claude Manges, CMA    BP Pulse Ht Wt BMI    136/72 mmHg 81 5\' 2"  (1.575 m) 235 lb 9.6 oz (106.867 kg) 43.08 kg/m2    Vitals History     Progress Notes      Carlena Bjornstad, MD at 12/23/2014 8:54 AM     Status: Sign at close encounter       Expand All Collapse All      Cardiology Office Note   Date: 03/21/9766   ID: Jamie Harding, DOB 01/21/1934, MRN [id number]  PCP: [name], MD Cardiologist: [name], MD   Chief Complaint  Patient presents with  . Appointment    Follow-up shortness of breath     History of Present Illness: Jamie Harding is a 59 y.o. female who presents to follow-up shortness of breath and chest tightness. I saw her last November 29, 2014. She underwent nuclear stress test on December 03, 2014. There was a small area of apical ischemia. It is possible that this could be due to shifting soft tissue attenuation. However EKGs were abnormal also. Overall I feel that the nuclear study does raise the question that her exertional shortness of breath and tightness is related to ischemia. She continues to have this symptom. She has not had any rest symptoms.  The patient also mentioned to me today that she had a CVA in 2008. I have now researched the prior information. She had some limited right-sided weakness in 2008. Carotid Dopplers were normal. MRI of the head did show a small pontine infarct. The description in the report suggests that the exact etiology was not clear. It was nonhemorrhagic. It is mentioned that it could've been due to small vessel disease or vasculitis or migraines. She has been kept on aspirin since then. She has not had any recurrent symptoms since then.  As part of today's evaluation I reviewed  extensively her older records.    Past Medical History  Diagnosis Date  . DM (diabetes mellitus)   . HTN (hypertension)   . Obese   . Depression   . Anxiety   . HLD (hyperlipidemia)   . Hiatal hernia   . Asthma   . GERD (gastroesophageal reflux disease)   . Fatty liver   . Gastritis   . Pancreatitis   . Tibialis tendinitis   . Chest pain     Nuclear February, 2012,, breast attenuation, no definite scar or ischemia  . Gastroparesis     NEGATIVE GES 2010  . Rheumatoid arthritis(714.0)   . Stroke 2008    no deficits  . Neuropathy   . Ejection fraction     EF 60%, echo, February, 2012, mild LVH  . Family history of anesthesia complication     post anesthesia N/V    Past Surgical History  Procedure Laterality Date  . Cesarean section    . Cholecystectomy    . Ankle surgery      remove extra bone-left  . Finger surgery      left little finger  . Lithotripsy    . Dilation and curettage of uterus      multiple  . Laser ablation of the cervix    . Carpal tunnel release  right side  . Tubal ligation    . Gastric emptying  07/27/2009    The amount of activity in the stomach at 120 minutes was 13% which is in the normal range  . Esophagogastroduodenoscopy  07/29/2009    Mild gastritis, benign path  . Colonoscopy with propofol  09/16/2012    ZOX:WRUE sessile polyps ranging between 3-44mm in size were found in the sigmoid colon and rectum; polypectomy was performed/Mild diverticulosis was noted throughout the entire examined colon/Small internal hemorrhoids  . Esophagogastroduodenoscopy (egd) with propofol  09/16/2012    SLF:Non-erosive gastritis (inflammation) was found; multiple bx/The duodenal mucosa showed no abnormalities in the ampulla and bulb and second portion of the duodenum/NAUSEA/VOMITING MOST LIKELY  DUE TO GERD/GASTRITIS  . Savory dilation  09/16/2012    Procedure: SAVORY DILATION; Surgeon: Danie Binder, MD; Location: [facility]; Service: Endoscopy; Laterality: N/A; 16 fr dilation  . Polypectomy  09/16/2012    Procedure: POLYPECTOMY; Surgeon: Danie Binder, MD; Location: [facility]; Service: Endoscopy; Laterality: N/A;  . Carpal tunnel release Left 02/06/2013    Procedure: CARPAL TUNNEL RELEASE ; Surgeon: Carole Civil, MD; Location: [facility]; Service: Orthopedics; Laterality: Left; procedure end 1135  . Trigger finger release Left 02/06/2013    Procedure: RELEASE TRIGGER FINGER LEFT LONG FINGER/A-1 PULLEY; Surgeon: Carole Civil, MD; Location: [facility]; Service: Orthopedics; Laterality: Left; procedure began 1136    Patient Active Problem List   Diagnosis Date Noted  . History of CVA (cerebrovascular accident) without residual deficits 12/23/2014  . Dyspnea on exertion 11/29/2014  . Dyspareunia 09/16/2013  . Trigger finger, acquired 02/17/2013  . S/P carpal tunnel release 02/17/2013  . Lumbar pain with radiation down left leg 02/16/2013  . Greater trochanteric bursitis 01/29/2013  . Sinus tarsi syndrome 01/29/2013  . Chest pain   . Ejection fraction   . Dyspepsia 08/28/2012  . Encounter for screening colonoscopy 08/28/2012  . Plantar fasciitis 12/20/2011  . TRIGGER FINGER, LEFT MIDDLE 07/19/2010  . GASTROPARESIS 07/19/2009  . Type 2 diabetes mellitus 07/15/2009  . Hyperlipidemia 07/15/2009  . ANXIETY 07/15/2009  . DEPRESSION 07/15/2009  . Essential hypertension 07/15/2009  . ASTHMA, UNSPECIFIED 07/15/2009  . HIATAL HERNIA 07/15/2009  . Fatty liver 07/15/2009  . DYSPHAGIA UNSPECIFIED 07/15/2009  . PANCREATITIS, HX OF 07/15/2009  . GASTRITIS, HX OF 07/15/2009  . TIBIALIS TENDINITIS 07/12/2008      Current Outpatient Prescriptions    Medication Sig Dispense Refill  . ADVAIR DISKUS 250-50 MCG/DOSE AEPB INHALE 1 PUFF INTO THE LUNGS 2 (TWO) TIMES DAILY. 60 each 5  . albuterol (PROVENTIL HFA;VENTOLIN HFA) 108 (90 BASE) MCG/ACT inhaler Inhale 2 puffs into the lungs every 4 (four) hours as needed for wheezing. 3 Inhaler 0  . ALPRAZolam (XANAX) 0.5 MG tablet Take 1 tablet (0.5 mg total) by mouth 2 (two) times daily as needed. For anxiety 60 tablet 4  . amLODipine (NORVASC) 10 MG tablet Take 1 tablet (10 mg total) by mouth daily. 30 tablet 0  . aspirin EC 81 MG tablet Take 81 mg by mouth daily.    . cetirizine (ZYRTEC) 10 MG tablet Take 1 tablet (10 mg total) by mouth daily. 30 tablet 6  . cyanocobalamin 1000 MCG tablet Take 1,000 mcg by mouth daily.     . fluconazole (DIFLUCAN) 150 MG tablet TAKE ONE WEEKLY 6 tablet 5  . fluticasone (FLONASE) 50 MCG/ACT nasal spray USE 2 SPRAYS IN EACH NOSTRIL DAILY 16 g 5  . furosemide (LASIX) 20 MG tablet Take  1 tablet (20 mg total) by mouth daily. 90 tablet 0  . gabapentin (NEURONTIN) 300 MG capsule one tablet q am, one tablet q afternoon and 2 tabs q evening 360 capsule 1  . glucose blood test strip Use as instructed 100 each 12  . HYDROcodone-acetaminophen (NORCO/VICODIN) 5-325 MG per tablet Take 1 tablet by mouth every 6 (six) hours as needed. 120 tablet 0  . Insulin Glargine (LANTUS SOLOSTAR) 100 UNIT/ML Solostar Pen INJECT 100 UNITS EVERY NIGHT AT BEDTIME 15 mL 0  . insulin glargine (LANTUS) 100 UNIT/ML injection Inject 100 Units into the skin at bedtime.    Marland Kitchen lisinopril (PRINIVIL,ZESTRIL) 20 MG tablet TAKE 1 TABLET BY MOUTH DAILY 90 tablet 0  . metFORMIN (GLUCOPHAGE) 1000 MG tablet TAKE 1 TABLET BY MOUTH TWICE DAILY 180 tablet 1  . methocarbamol (ROBAXIN) 500 MG tablet TAKE 1 TABLET BY MOUTH TWICE DAILY (Patient taking differently: TAKE 1 TABLET BY MOUTH AS NEEDED) 180 tablet 3  . Multiple  Vitamin (MULTIVITAMIN WITH MINERALS) TABS Take 1 tablet by mouth daily.    Marland Kitchen NOVOLOG FLEXPEN 100 UNIT/ML FlexPen USE THREE TIMES A DAY UNDER THE SKIN PER SLIDING SCALE (UP TO 15 UNITS AT A TIME 15 mL 3  . ondansetron (ZOFRAN) 8 MG tablet TAKE 1 TABLET BY MOUTH EVERY 12 HOURS AS NEEDED FOR NAUSEA 20 tablet 2  . pantoprazole (PROTONIX) 40 MG tablet Take 1 tablet (40 mg total) by mouth 2 (two) times daily. 180 tablet 3  . promethazine (PHENERGAN) 25 MG tablet Take 1 tablet (25 mg total) by mouth every 8 (eight) hours as needed for nausea. 30 tablet 3  . pyridOXINE (VITAMIN B-6) 100 MG tablet Take 100 mg by mouth daily.    . rosuvastatin (CRESTOR) 10 MG tablet Take 1 tablet (10 mg total) by mouth daily. 90 tablet 1  . sitaGLIPtin (JANUVIA) 100 MG tablet Take 1 tablet (100 mg total) by mouth daily. 30 tablet 5  . UNIFINE PENTIPS 31G X 8 MM MISC USE AS DIRECTED THREE TIMES A DAY AS NEEDED 300 each 5  . venlafaxine (EFFEXOR) 75 MG tablet Take 1 tablet (75 mg total) by mouth 2 (two) times daily with a meal. 180 tablet 3   No current facility-administered medications for this visit.    Allergies: Byetta 10 mcg pen; Naproxen; Augmentin; Azithromycin; Erythromycin; Invokana; Morphine; and Sulfonamide derivatives    Social History: The patient  reports that she quit smoking about 27 years ago. Her smoking use included Cigarettes. She has a 1.5 pack-year smoking history. She does not have any smokeless tobacco history on file. She reports that she does not drink alcohol or use illicit drugs.   Family History: The patient's family history includes Arthritis in an other family member; Asthma in an other family member; Breast cancer in her mother; Coronary artery disease in an other family member; Diabetes in her father and sister. There is no history of Colon cancer.    ROS: Please see the history of present illness. Patient denies fever, chills,  headache, sweats, rash, change in vision, change in hearing, cough, nausea or vomiting, urinary symptoms. All other systems are reviewed and are negative.    PHYSICAL EXAM: VS: BP 136/72 mmHg  Pulse 81  Ht 5\' 2"  (1.575 m)  Wt 235 lb 9.6 oz (106.867 kg)  BMI 43.08 kg/m2 , Patient is stable. She is overweight. She is oriented to person time and place. Affect is normal. Head is atraumatic. Sclera and conjunctiva are normal.  There is no jugular venous distention. Lungs are clear. Respiratory effort is nonlabored. Cardiac exam reveals S1 and S2. The abdomen is soft. There is no peripheral edema. There are no musculoskeletal deformities. There are no skin rashes. Neurologic is grossly intact.  EKG: EKG is not done today.   Recent Labs: 12/09/2014: ALT 18; BUN 12; Creatinine 0.88; Potassium 4.4; Sodium 141    Lipid Panel  Labs (Brief)       Component Value Date/Time   CHOL 156 12/09/2014 0940   TRIG 97 12/09/2014 0940   HDL 43 12/09/2014 0940   CHOLHDL 3.6 12/09/2014 0940   VLDL 19 12/09/2014 0940   LDLCALC 94 12/09/2014 0940       Wt Readings from Last 3 Encounters:  12/23/14 235 lb 9.6 oz (106.867 kg)  12/03/14 230 lb (104.327 kg)  12/02/14 234 lb (106.142 kg)      Current medicines are reviewed. Today we did not change her medicines. She has no questions about her medicines.     ASSESSMENT AND PLAN:              Dyspnea on exertion - Carlena Bjornstad, MD at 12/23/2014 9:25 AM     Status: Written Related Problem: Dyspnea on exertion   Expand All Collapse All   At this time I'm concerned that her exertional shortness of breath and chest tightness represents ischemia. EKGs are abnormal with her stress test. There is mild apical ischemia. I feel that it is appropriate to proceed now with cardiac catheterization. Left ventricular function is normal by nuclear study. Two-dimensional echo will be done to be sure that there is  no occult valvular disease. In addition this will help assess right ventricular function. Catheterization is being arranged. I am setting up as right and left heart cath at this point. I had a long and careful discussion with the patient about the indication for catheterization. We reviewed the risks and benefits completely. She is very interested in proceeding. Arrangements are being made.  As part of today's evaluation I've spent greater than 45 minutes on her total care. More than half of this time has been with direct contact counseling her and talking with her about her prior stroke and about catheterization. I also made arrangements for her catheterization and have written cardiac cath orders.            Chest tightness - Carlena Bjornstad, MD at 12/23/2014 9:26 AM     Status: Written Related Problem: Chest tightness   Expand All Collapse All   She has chest tightness when she has shortness of breath with exertion. It is now time to proceed with cardiac catheterization for further assessment.            Hyperlipidemia - Carlena Bjornstad, MD at 12/23/2014 9:27 AM     Status: Written Related Problem: Hyperlipidemia   Expand All Collapse All   She is receiving a statin for her lipids.            History of CVA (cerebrovascular accident) without residual deficits - Carlena Bjornstad, MD at 12/23/2014 9:29 AM     Status: Written Related Problem: History of CVA (cerebrovascular accident) without residual deficits   Expand All Collapse All   I learned about the CVA that she had in 2008 when talking with her today. The exact etiology is not clear. She has not had documented atrial fibrillation over time. Carotid Dopplers at that time showed no marked abnormalities. The  MRI at that time showed an abnormality in the pontine area. The exact etiology was not clear. She's been treated with aspirin since then and has had no recurring symptoms.         Daryel November, MD

## 2015-01-03 NOTE — Progress Notes (Signed)
Site area: left venous brachial  Site Prior to Removal:  Level 0  Pressure Applied For 10 MINUTES    Minutes Beginning at 0900  Manual:   Yes.    Patient Status During Pull:  stable  Post Pull Brachial Site:  Level 0  Post Pull Instructions Given:  Yes.    Post Pull Pulses Present:  Yes.    Dressing Applied:  Yes.     Bedrest Begins: [id number]  Comments:  Pt tolerated venous sheath pull well. VSS

## 2015-01-03 NOTE — CV Procedure (Signed)
    Cardiac Catheterization Procedure Note  Name: Jamie Harding MRN: [id number] DOB: 1956/04/21  Procedure: Right Heart Cath, Left Heart Cath, Selective Coronary Angiography, LV angiography  Indication: Shortness of breath, abnormal Myoview  Procedural Details: There was an indwelling IV in a left antecubital vein. Using normal sterile technique, the IV was changed out for a 5 Fr brachial sheath over a 0.018 inch wire. The left wrist was then prepped, draped, and anesthetized with 1% lidocaine. Using the modified Seldinger technique a 5/6 French Slender sheath was placed in the left radial artery. Intra-arterial verapamil was administered through the radial artery sheath. IV heparin was administered after a JR4 catheter was advanced into the central aorta. A Swan-Ganz catheter was used for the right heart catheterization. Standard protocol was followed for recording of right heart pressures and sampling of oxygen saturations. Fick cardiac output was calculated. Standard Judkins catheters were used for selective coronary angiography and left ventriculography. There were no immediate procedural complications. The patient was transferred to the post catheterization recovery area for further monitoring.  Procedural Findings: Hemodynamics RA 8 RV 34/8 PA 25/8 mean 14 PCWP 10 (a-wave 14, v-wave 12) LV 146/11 AO 142/69 mean 99  Oxygen saturations: PA 77 AO 99 SVC 78  Cardiac Output (Fick) 6.7 L/M  Cardiac Index (Fick) 3.3 L/M/sqare meter   Coronary angiography: Coronary dominance: right  Left mainstem: The left main is mildly calcified. The distal left main has mild irregularity with 20% stenosis. The left main divides into the LAD and left circumflex.  Left anterior descending (LAD): The LAD is mildly calcified. The vessel is patent throughout. After the first diagonal branch, the LAD is small in caliber. There are mild irregularities but no significant stenoses identified.  Left  circumflex (LCx): The left circumflex covers a relatively small area of myocardium. The first obtuse marginal is medium in caliber without significant stenosis. There is some irregularity in that vessel. The AV circumflex beyond the OM has 2 very small subbranch is without significant stenoses.  Right coronary artery (RCA): The right coronary artery is dominant. The vessel is large in caliber. There are some mild irregularities in the mid vessel. The PDA and PLA branches are patent without significant stenoses.  Left ventriculography: Left ventricular systolic function is vigorous, LVEF is estimated at 65-70%, there is no significant mitral regurgitation   Estimated Blood Loss: Minimal  Final Conclusions:   1. Normal intracardiac hemodynamics with normal pulmonary artery pressure and normal cardiac output 2. Patent coronary arteries with evidence of nonobstructive atherosclerosis, including luminal irregularities and vessel calcification. There is no obstructive disease identified. 3. Vigorous left ventricular systolic function with normal LVEDP  Recommendations: Risk reduction measures. Suspect shortness of breath is unrelated to cardiac disease.  Sherren Mocha MD, [facility] 01/03/2015, 8:40 AM

## 2015-01-12 ENCOUNTER — Encounter: Payer: Self-pay | Admitting: *Deleted

## 2015-01-21 ENCOUNTER — Encounter: Payer: Self-pay | Admitting: Family Medicine

## 2015-02-02 ENCOUNTER — Encounter: Payer: Self-pay | Admitting: Family Medicine

## 2015-02-02 ENCOUNTER — Ambulatory Visit (INDEPENDENT_AMBULATORY_CARE_PROVIDER_SITE_OTHER): Payer: 59 | Admitting: Family Medicine

## 2015-02-02 VITALS — BP 122/82 | Temp 98.0°F | Ht 63.0 in | Wt 234.6 lb

## 2015-02-02 DIAGNOSIS — N3000 Acute cystitis without hematuria: Secondary | ICD-10-CM | POA: Diagnosis not present

## 2015-02-02 DIAGNOSIS — E1165 Type 2 diabetes mellitus with hyperglycemia: Secondary | ICD-10-CM | POA: Diagnosis not present

## 2015-02-02 MED ORDER — CEFPROZIL 500 MG PO TABS: 500.0000 mg | ORAL_TABLET | Freq: Two times a day (BID) | ORAL | Status: DC

## 2015-02-02 MED ORDER — HYDROCODONE-HOMATROPINE 5-1.5 MG/5ML PO SYRP: 5.0000 mL | ORAL_SOLUTION | Freq: Four times a day (QID) | ORAL | Status: DC | PRN

## 2015-02-02 NOTE — Progress Notes (Signed)
   Subjective:    Patient ID: Jamie Harding, female    DOB: 01/04/1956, 59 y.o.   MRN: [id number]  Dysuria  This is a new problem. The current episode started 1 to 4 weeks ago. Associated symptoms include frequency and urgency. Associated symptoms comments: burning. Treatments tried: AZO/cranberry.    Patient has history of diabetes sugars have been variable although elevated at times  Review of Systems  Constitutional: Negative for fever and fatigue.  Gastrointestinal: Negative for abdominal pain.  Genitourinary: Positive for dysuria, urgency and frequency.       Objective:   Physical Exam  Constitutional: She appears well-nourished. No distress.  Cardiovascular: Normal rate, regular rhythm and normal heart sounds.   No murmur heard. Pulmonary/Chest: Effort normal and breath sounds normal. No respiratory distress.  Musculoskeletal: She exhibits no edema.  Lymphadenopathy:    She has no cervical adenopathy.  Neurological: She is alert. She exhibits normal muscle tone.  Psychiatric: Her behavior is normal.  Vitals reviewed.         Assessment & Plan:  UTI antibiotics prescribed warning signs discuss Diabetes improvement of diabetes is present but we talked about better ways of adjusting insulin get this under better control Follow-up in May for A1c

## 2015-02-04 ENCOUNTER — Encounter: Payer: Self-pay | Admitting: Cardiology

## 2015-02-04 ENCOUNTER — Ambulatory Visit (INDEPENDENT_AMBULATORY_CARE_PROVIDER_SITE_OTHER): Payer: 59 | Admitting: Cardiology

## 2015-02-04 VITALS — BP 124/68 | HR 73 | Ht 63.0 in | Wt 233.4 lb

## 2015-02-04 DIAGNOSIS — R0609 Other forms of dyspnea: Secondary | ICD-10-CM

## 2015-02-04 DIAGNOSIS — R0789 Other chest pain: Secondary | ICD-10-CM

## 2015-02-04 LAB — URINE CULTURE

## 2015-02-04 NOTE — Assessment & Plan Note (Signed)
Catheterization reveals no significant obstructive abnormalities at this time. No further cardiac workup at this time.

## 2015-02-04 NOTE — Assessment & Plan Note (Signed)
At this point there is no definite cardiac etiology for her symptoms. Right heart pressures and LVEDP were normal. She is encouraged to increase her activity level.

## 2015-02-04 NOTE — Patient Instructions (Signed)
Your physician recommends that you continue on your current medications as directed. Please refer to the Current Medication list given to you today.  Your physician wants you to follow-up in: 1 year in our [place] office. You will receive a reminder letter in the mail two months in advance. If you don't receive a letter, please call our office to schedule the follow-up appointment.

## 2015-02-04 NOTE — Progress Notes (Signed)
Cardiology Office Note   Date:  02/25/8118   ID:  Jamie Harding, DOB [birth year], MRN [id number]  PCP:  [name], MD  Cardiologist:  [name], MD   Chief Complaint  Patient presents with  . Appointment    Follow-up shortness of breath      History of Present Illness: Jamie Harding is a 59 y.o. female who presents today to follow-up for evaluation for shortness of breath and chest tightness. I've been concerned because of known coronary disease. Her nuclear scan was abnormal and we decided to proceed with follow-up echo and heart catheterization. The echo showed normal ejection fraction of 60%. Right heart cath showed normal right heart pressures. Left heart cath revealed no significant change in her coronaries. Also LVEDP was normal. Therefore at this point there is no obvious cardiac basis for her exertional shortness of breath and chest tightness. She does not appear to be volume overloaded by right heart cath. I have reassured her. I have recommended no further cardiac workup.    Past Medical History  Diagnosis Date  . DM (diabetes mellitus)   . HTN (hypertension)   . Obese   . Depression   . Anxiety   . HLD (hyperlipidemia)   . Hiatal hernia   . Asthma   . GERD (gastroesophageal reflux disease)   . Fatty liver   . Gastritis   . Pancreatitis   . Tibialis tendinitis   . Chest pain     Nuclear  February, 2012,,  breast attenuation, no definite scar or ischemia  . Gastroparesis     NEGATIVE GES 2010  . Rheumatoid arthritis(714.0)   . Stroke 2008    no deficits  . Neuropathy   . Ejection fraction     EF 60%, echo, February, 2012, mild LVH  . Family history of anesthesia complication     post anesthesia N/V    Past Surgical History  Procedure Laterality Date  . Cesarean section    . Cholecystectomy    . Ankle surgery      remove extra bone-left  . Finger surgery      left little finger  . Lithotripsy    . Dilation and curettage of uterus     multiple  . Laser ablation of the cervix    . Carpal tunnel release      right side  . Tubal ligation    . Gastric emptying  07/27/2009    The amount of activity in the stomach at 120 minutes was 13% which is in the normal range  . Esophagogastroduodenoscopy  07/29/2009    Mild gastritis, benign path  . Colonoscopy with propofol  09/16/2012    HQI:ONGE sessile polyps ranging between 3-66mm in size were found in the sigmoid colon and rectum; polypectomy was performed/Mild diverticulosis was noted throughout the entire examined colon/Small internal hemorrhoids  . Esophagogastroduodenoscopy (egd) with propofol  09/16/2012    SLF:Non-erosive gastritis (inflammation) was found; multiple bx/The duodenal mucosa showed no abnormalities in the ampulla and bulb and second portion of the duodenum/NAUSEA/VOMITING MOST LIKELY DUE TO GERD/GASTRITIS  . Savory dilation  09/16/2012    Procedure: SAVORY DILATION;  Surgeon: Danie Binder, MD;  Location: [facility];  Service: Endoscopy;  Laterality: N/A;  16 fr dilation  . Polypectomy  09/16/2012    Procedure: POLYPECTOMY;  Surgeon: Danie Binder, MD;  Location: [facility];  Service: Endoscopy;  Laterality: N/A;  . Carpal tunnel release Left 02/06/2013    Procedure: CARPAL  TUNNEL RELEASE ;  Surgeon: Carole Civil, MD;  Location: [facility];  Service: Orthopedics;  Laterality: Left;  procedure end 1135  . Trigger finger release Left 02/06/2013    Procedure: RELEASE TRIGGER FINGER LEFT LONG FINGER/A-1 PULLEY;  Surgeon: Carole Civil, MD;  Location: [facility];  Service: Orthopedics;  Laterality: Left;  procedure began 1136  . Left and right heart catheterization with coronary angiogram N/A 01/03/2015    Procedure: LEFT AND RIGHT HEART CATHETERIZATION WITH CORONARY ANGIOGRAM;  Surgeon: Blane Ohara, MD;  Location: [facility] C[facility];  Service: Cardiovascular;  Laterality: N/A;    Patient Active Problem List   Diagnosis Date Noted  . Shortness of breath   . History of CVA  (cerebrovascular accident) without residual deficits 12/23/2014  . Dyspnea on exertion 11/29/2014  . Dyspareunia 09/16/2013  . Trigger finger, acquired 02/17/2013  . S/P carpal tunnel release 02/17/2013  . Lumbar pain with radiation down left leg 02/16/2013  . Greater trochanteric bursitis 01/29/2013  . Sinus tarsi syndrome 01/29/2013  . Chest tightness   . Ejection fraction   . Dyspepsia 08/28/2012  . Encounter for screening colonoscopy 08/28/2012  . Plantar fasciitis 12/20/2011  . TRIGGER FINGER, LEFT MIDDLE 07/19/2010  . GASTROPARESIS 07/19/2009  . Type 2 diabetes mellitus 07/15/2009  . Hyperlipidemia 07/15/2009  . ANXIETY 07/15/2009  . DEPRESSION 07/15/2009  . Essential hypertension 07/15/2009  . ASTHMA, UNSPECIFIED 07/15/2009  . HIATAL HERNIA 07/15/2009  . Fatty liver 07/15/2009  . DYSPHAGIA UNSPECIFIED 07/15/2009  . PANCREATITIS, HX OF 07/15/2009  . GASTRITIS, HX OF 07/15/2009  . TIBIALIS TENDINITIS 07/12/2008      Current Outpatient Prescriptions  Medication Sig Dispense Refill  . ADVAIR DISKUS 250-50 MCG/DOSE AEPB INHALE 1 PUFF INTO THE LUNGS 2 (TWO) TIMES DAILY. 60 each 5  . albuterol (PROVENTIL HFA;VENTOLIN HFA) 108 (90 BASE) MCG/ACT inhaler Inhale 2 puffs into the lungs every 4 (four) hours as needed for wheezing. 3 Inhaler 0  . ALPRAZolam (XANAX) 0.5 MG tablet Take 1 tablet (0.5 mg total) by mouth 2 (two) times daily as needed. For anxiety 60 tablet 4  . amLODipine (NORVASC) 10 MG tablet Take 1 tablet (10 mg total) by mouth daily. 30 tablet 0  . aspirin EC 81 MG tablet Take 81 mg by mouth daily.    . cefPROZIL (CEFZIL) 500 MG tablet Take 1 tablet (500 mg total) by mouth 2 (two) times daily. 14 tablet 0  . cetirizine (ZYRTEC) 10 MG tablet Take 1 tablet (10 mg total) by mouth daily. 30 tablet 6  . cyanocobalamin 1000 MCG tablet Take 1,000 mcg by mouth daily.     . fluconazole (DIFLUCAN) 150 MG tablet TAKE ONE WEEKLY 6 tablet 5  . fluticasone (FLONASE) 50 MCG/ACT  nasal spray USE 2 SPRAYS IN EACH NOSTRIL DAILY 16 g 5  . furosemide (LASIX) 20 MG tablet Take 1 tablet (20 mg total) by mouth daily. 90 tablet 0  . gabapentin (NEURONTIN) 300 MG capsule one tablet q am, one tablet q afternoon and 2 tabs q evening 360 capsule 1  . glucose blood test strip Use as instructed 100 each 12  . HYDROcodone-acetaminophen (NORCO/VICODIN) 5-325 MG per tablet Take 1 tablet by mouth every 6 (six) hours as needed. 120 tablet 0  . HYDROcodone-homatropine (HYCODAN) 5-1.5 MG/5ML syrup Take 5 mLs by mouth every 6 (six) hours as needed for cough. 120 mL 0  . Insulin Glargine (LANTUS SOLOSTAR) 100 UNIT/ML Solostar Pen INJECT 100 UNITS EVERY NIGHT AT BEDTIME  15 mL 0  . lisinopril (PRINIVIL,ZESTRIL) 20 MG tablet TAKE 1 TABLET BY MOUTH DAILY 90 tablet 0  . metFORMIN (GLUCOPHAGE) 1000 MG tablet TAKE 1 TABLET BY MOUTH TWICE DAILY 180 tablet 1  . methocarbamol (ROBAXIN) 500 MG tablet TAKE 1 TABLET BY MOUTH TWICE DAILY (Patient taking differently: TAKE 1 TABLET BY MOUTH AS NEEDED) 180 tablet 3  . Multiple Vitamin (MULTIVITAMIN WITH MINERALS) TABS Take 1 tablet by mouth daily.    Marland Kitchen NOVOLOG FLEXPEN 100 UNIT/ML FlexPen USE THREE TIMES A DAY UNDER THE SKIN PER SLIDING SCALE (UP TO 15 UNITS AT A TIME 15 mL 3  . ondansetron (ZOFRAN) 8 MG tablet TAKE 1 TABLET BY MOUTH EVERY 12 HOURS AS NEEDED FOR NAUSEA 20 tablet 2  . pantoprazole (PROTONIX) 40 MG tablet Take 1 tablet (40 mg total) by mouth 2 (two) times daily. 180 tablet 3  . promethazine (PHENERGAN) 25 MG tablet Take 1 tablet (25 mg total) by mouth every 8 (eight) hours as needed for nausea. 30 tablet 3  . pyridOXINE (VITAMIN B-6) 100 MG tablet Take 100 mg by mouth daily.    . rosuvastatin (CRESTOR) 10 MG tablet Take 1 tablet (10 mg total) by mouth daily. 90 tablet 1  . sitaGLIPtin (JANUVIA) 100 MG tablet Take 1 tablet (100 mg total) by mouth daily. 30 tablet 5  . UNIFINE PENTIPS 31G X 8 MM MISC USE AS DIRECTED THREE TIMES A DAY AS NEEDED 300  each 5  . venlafaxine (EFFEXOR) 75 MG tablet Take 1 tablet (75 mg total) by mouth 2 (two) times daily with a meal. 180 tablet 3   No current facility-administered medications for this visit.    Allergies:   Byetta 10 mcg pen; Naproxen; Augmentin; Azithromycin; Erythromycin; Invokana; Morphine; and Sulfonamide derivatives    Social History:  The patient  reports that she quit smoking about 27 years ago. Her smoking use included Cigarettes. She has a 1.5 pack-year smoking history. She does not have any smokeless tobacco history on file. She reports that she does not drink alcohol or use illicit drugs.   Family History:  The patient's family history includes Arthritis in an other family member; Asthma in an other family member; Breast cancer in her mother; Coronary artery disease in an other family member; Diabetes in her father and sister. There is no history of Colon cancer.    ROS:  Please see the history of present illness.     Patient denies fever, chills, headache, sweats, rash, change in vision, change in hearing, cough, nausea or vomiting, urinary symptoms. All other systems are reviewed and are negative.   PHYSICAL EXAM: VS:  BP 124/68 mmHg  Pulse 73  Ht 5\' 3"  (1.6 m)  Wt 233 lb 6.4 oz (105.87 kg)  BMI 41.36 kg/m2 , Patient is overweight. She is stable. Her left radial cath site is stable and the area and her left arm where the right heart cath was done is also stable. Head is atraumatic. Sclera and conjunctiva are normal. There is no jugulovenous distention. Lungs are clear. Respiratory effort is nonlabored. Cardiac exam reveals an S1 and S2. Abdomen is soft. There is no peripheral edema.  EKG:   EKG is not done today. Recent Labs: 12/09/2014: ALT 18 12/23/2014: BUN 12; Creatinine 0.90; Hemoglobin 14.1; Platelets 274.0; Potassium 4.4; Sodium 137    Lipid Panel    Component Value Date/Time   CHOL 156 12/09/2014 0940   TRIG 97 12/09/2014 0940   HDL 43 12/09/2014  0940   CHOLHDL  3.6 12/09/2014 0940   VLDL 19 12/09/2014 0940   LDLCALC 94 12/09/2014 0940      Wt Readings from Last 3 Encounters:  02/04/15 233 lb 6.4 oz (105.87 kg)  02/02/15 234 lb 9.6 oz (106.414 kg)  01/03/15 226 lb (102.513 kg)      Current medicines are reviewed       ASSESSMENT AND PLAN:

## 2015-02-15 ENCOUNTER — Encounter: Payer: Self-pay | Admitting: Family Medicine

## 2015-02-16 DIAGNOSIS — Z0289 Encounter for other administrative examinations: Secondary | ICD-10-CM

## 2015-02-21 ENCOUNTER — Telehealth: Payer: Self-pay | Admitting: Family Medicine

## 2015-02-21 MED ORDER — LEVOFLOXACIN 500 MG PO TABS: 500.0000 mg | ORAL_TABLET | Freq: Every day | ORAL | Status: DC

## 2015-02-21 NOTE — Telephone Encounter (Signed)
Patient,via son, requested additional antibiotics for UTI. Leavquin sent in patient to follow up if ongoing issues

## 2015-02-22 ENCOUNTER — Encounter: Payer: Self-pay | Admitting: Family Medicine

## 2015-02-28 ENCOUNTER — Encounter: Payer: Self-pay | Admitting: Family Medicine

## 2015-03-03 ENCOUNTER — Ambulatory Visit (INDEPENDENT_AMBULATORY_CARE_PROVIDER_SITE_OTHER): Payer: 59 | Admitting: Orthopedic Surgery

## 2015-03-03 ENCOUNTER — Encounter: Payer: Self-pay | Admitting: Orthopedic Surgery

## 2015-03-03 VITALS — BP 163/88 | Ht 63.0 in | Wt 233.4 lb

## 2015-03-03 DIAGNOSIS — M5137 Other intervertebral disc degeneration, lumbosacral region: Secondary | ICD-10-CM | POA: Diagnosis not present

## 2015-03-03 DIAGNOSIS — M5410 Radiculopathy, site unspecified: Secondary | ICD-10-CM | POA: Diagnosis not present

## 2015-03-03 DIAGNOSIS — M65342 Trigger finger, left ring finger: Secondary | ICD-10-CM | POA: Diagnosis not present

## 2015-03-03 NOTE — Patient Instructions (Signed)
ESI series for L5-S1 for Lumbar.

## 2015-03-03 NOTE — Progress Notes (Signed)
Patient ID: Jamie Harding, female   DOB: [birth year], 59 y.o.   MRN: [id number] 2 new problems today. Problem #1 left hip pain with pain radiating to left leg  Problem #2 triggering left ring finger  The patient has a history of L5-S1 disc degeneration at previous workup in 2014 including x-ray and MRI eventually having epidural steroid injections and saw Dr. Sherwood Gambler who thought she was not in need of surgery at that time presents back with pain which she describes in her hip but the pain is actually in her lower back and radiates down to her left lateral leg. No bowel bladder dysfunction at this time. No recent trauma. Pain is a dull aching sensation worse with standing at work. She denies any weakness bowel or bladder dysfunction.  As far as the left hand goes the ring finger is tender over the A1 pulley with catching locking and triggering and painful range of motion  Past Medical History  Diagnosis Date  . DM (diabetes mellitus)   . HTN (hypertension)   . Obese   . Depression   . Anxiety   . HLD (hyperlipidemia)   . Hiatal hernia   . Asthma   . GERD (gastroesophageal reflux disease)   . Fatty liver   . Gastritis   . Pancreatitis   . Tibialis tendinitis   . Chest pain     Nuclear  February, 2012,,  breast attenuation, no definite scar or ischemia  . Gastroparesis     NEGATIVE GES 2010  . Rheumatoid arthritis(714.0)   . Stroke 2008    no deficits  . Neuropathy   . Ejection fraction     EF 60%, echo, February, 2012, mild LVH  . Family history of anesthesia complication     post anesthesia N/V   She has stable vital signs as recorded appearance well-groomed she is oriented 3 mood is normal she ambulates with a slight favoring of the left side.  She has tenderness in the lower back tenderness over the left greater trochanter painless range of motion of the left hip no weakness in the left leg hip and knee are stable skin over the back does not show rash or pigmented lesion.  Distal pulses intact sensation normal no pathologic reflexes week sciatic nerve stretch test  Left hand tenderness over the A1 pulley catching locking demonstrated no swelling and normal strength the flexor tendon   Data reviewed includes the following MRI from 2014 an x-ray from 2013  Findings: Normal lumbar segmentation.  Stable vertebral height and alignment.  Relatively preserved disc spaces except at L5-S1 where there is chronic advanced disc space loss with endplate spurring. No pars fracture.  Mild L5-S1 facet hypertrophy.  SI joints within normal limits.  Right upper quadrant surgical clips.   IMPRESSION: No acute osseous abnormality in the lumbar spine.  Chronic advanced L5-S1 disc degeneration radiographically stable since 2011. IMPRESSION:   1.  Shallow foraminal/extraforaminal disc protrusion on the left at L3-4 L4-5 without direct neural compression. 2.  Moderate-to-large central disc protrusion at L5-S1. 3.  Diffuse bulging degenerated annulus at L5-S1 along with osteophytic spurring contributing to extraforaminal encroachment on both L5 nerve roots, left greater than right.   I set her up for repeat epidural injections as this is not a part of my practice any longer but she can always see Dr. Lucia Gaskins again if necessary  I injected her left ring finger trigger finger  The patient consented verbally to a left ring finger trigger  release this was confirmed by timeout  The skin was fair with alcohol and ethyl chloride and then injected with a cc of Depo-Medrol and a cc of lidocaine 1% no complications noted.

## 2015-03-08 ENCOUNTER — Other Ambulatory Visit: Payer: Self-pay | Admitting: *Deleted

## 2015-03-08 DIAGNOSIS — M48061 Spinal stenosis, lumbar region without neurogenic claudication: Secondary | ICD-10-CM

## 2015-03-21 ENCOUNTER — Encounter: Payer: Self-pay | Admitting: *Deleted

## 2015-03-22 ENCOUNTER — Other Ambulatory Visit: Payer: Self-pay | Admitting: Family Medicine

## 2015-03-22 NOTE — Telephone Encounter (Signed)
Dr. Nicki Reaper, my prescription has run out and I don't come into the office til next week on may 11. can you please write me one prescription for my pain meds and I will get teddy to pick it up tomorrow. Thank you, Keosha

## 2015-03-22 NOTE — Telephone Encounter (Signed)
May give patient prescription as requested. Keep appointment next week.

## 2015-03-24 ENCOUNTER — Other Ambulatory Visit: Payer: Self-pay | Admitting: *Deleted

## 2015-03-24 MED ORDER — HYDROCODONE-ACETAMINOPHEN 5-325 MG PO TABS: 1.0000 | ORAL_TABLET | Freq: Four times a day (QID) | ORAL | Status: DC | PRN

## 2015-03-24 NOTE — Telephone Encounter (Signed)
I sort of thought this was already handled? May have one refill but keep office visit

## 2015-03-30 ENCOUNTER — Encounter: Payer: Self-pay | Admitting: Family Medicine

## 2015-03-30 ENCOUNTER — Ambulatory Visit (INDEPENDENT_AMBULATORY_CARE_PROVIDER_SITE_OTHER): Payer: 59 | Admitting: Family Medicine

## 2015-03-30 VITALS — BP 128/70 | Ht 63.0 in | Wt 234.0 lb

## 2015-03-30 DIAGNOSIS — G894 Chronic pain syndrome: Secondary | ICD-10-CM | POA: Insufficient documentation

## 2015-03-30 DIAGNOSIS — Z79899 Other long term (current) drug therapy: Secondary | ICD-10-CM

## 2015-03-30 DIAGNOSIS — M79605 Pain in left leg: Secondary | ICD-10-CM

## 2015-03-30 DIAGNOSIS — M545 Low back pain: Secondary | ICD-10-CM | POA: Diagnosis not present

## 2015-03-30 DIAGNOSIS — E119 Type 2 diabetes mellitus without complications: Secondary | ICD-10-CM | POA: Diagnosis not present

## 2015-03-30 LAB — POCT GLYCOSYLATED HEMOGLOBIN (HGB A1C): HEMOGLOBIN A1C: 10.5

## 2015-03-30 MED ORDER — PANTOPRAZOLE SODIUM 40 MG PO TBEC: 40.0000 mg | DELAYED_RELEASE_TABLET | Freq: Two times a day (BID) | ORAL | Status: DC

## 2015-03-30 MED ORDER — FLUTICASONE PROPIONATE 50 MCG/ACT NA SUSP: 2.0000 | Freq: Every day | NASAL | Status: DC

## 2015-03-30 MED ORDER — SITAGLIPTIN PHOSPHATE 100 MG PO TABS: 100.0000 mg | ORAL_TABLET | Freq: Every day | ORAL | Status: DC

## 2015-03-30 MED ORDER — AMLODIPINE BESYLATE 10 MG PO TABS: 10.0000 mg | ORAL_TABLET | Freq: Every day | ORAL | Status: DC

## 2015-03-30 MED ORDER — HYDROCODONE-ACETAMINOPHEN 5-325 MG PO TABS: 1.0000 | ORAL_TABLET | Freq: Four times a day (QID) | ORAL | Status: DC | PRN

## 2015-03-30 MED ORDER — GABAPENTIN 300 MG PO CAPS: ORAL_CAPSULE | ORAL | Status: DC

## 2015-03-30 MED ORDER — METHOCARBAMOL 500 MG PO TABS: 500.0000 mg | ORAL_TABLET | Freq: Two times a day (BID) | ORAL | Status: DC | PRN

## 2015-03-30 MED ORDER — ROSUVASTATIN CALCIUM 10 MG PO TABS: 10.0000 mg | ORAL_TABLET | Freq: Every day | ORAL | Status: DC

## 2015-03-30 MED ORDER — FUROSEMIDE 20 MG PO TABS: 20.0000 mg | ORAL_TABLET | Freq: Every day | ORAL | Status: DC

## 2015-03-30 MED ORDER — METFORMIN HCL 1000 MG PO TABS: 1000.0000 mg | ORAL_TABLET | Freq: Two times a day (BID) | ORAL | Status: DC

## 2015-03-30 MED ORDER — LISINOPRIL 20 MG PO TABS: 20.0000 mg | ORAL_TABLET | Freq: Every day | ORAL | Status: DC

## 2015-03-30 NOTE — Progress Notes (Signed)
Subjective:    Patient ID: Jamie Harding, female    DOB: 04/10/1956, 59 y.o.   MRN: [id number]  Diabetes She presents for her follow-up diabetic visit. She has type 2 diabetes mellitus. Pertinent negatives for hypoglycemia include no confusion. Pertinent negatives for diabetes include no chest pain, no fatigue, no polydipsia, no polyphagia and no weakness. Her breakfast blood glucose range is generally >200 mg/dl. Her lunch blood glucose range is generally >200 mg/dl. Her dinner blood glucose range is generally >200 mg/dl. Her highest blood glucose is >200 mg/dl. (Blood sugars as high as 500) She does not see a podiatrist.Eye exam is current (jan 2016).   Bilateral leg pain. Started years ago but lately has been waking her at night. Feels like cramping pain in the back of leg.  Bilateral hand and finger pain. Ongoing for awhile. Has had injections in hands.  This patient was seen today for chronic pain  The medication list was reviewed and updated.   -Compliance with pain medication: takes hydrocodone 5/325 one q 6 hours prn. Takes as prescribed.   The patient was advised the importance of maintaining medication and not using illegal substances with these.  Refills needed: yes  The patient was educated that we can provide 3 monthly scripts for their medication, it is their responsibility to follow the instructions.  Side effects or complications from medications: some constipation. Taking fiber gummies.   Patient is aware that pain medications are meant to minimize the severity of the pain to allow their pain levels to improve to allow for better function. They are aware of that pain medications cannot totally remove their pain.  Due for UDT ( at least once per year) : urine drug screen today.  Needs refills on amlodipine and robaxin.     Review of Systems  Constitutional: Negative for activity change, appetite change and fatigue.  HENT: Negative for congestion.   Respiratory:  Negative for cough.   Cardiovascular: Negative for chest pain.  Gastrointestinal: Negative for abdominal pain.  Endocrine: Negative for polydipsia and polyphagia.  Neurological: Negative for weakness.  Psychiatric/Behavioral: Negative for confusion.       Objective:   Physical Exam  Constitutional: She appears well-nourished. No distress.  Cardiovascular: Normal rate, regular rhythm and normal heart sounds.   No murmur heard. Pulmonary/Chest: Effort normal and breath sounds normal. No respiratory distress.  Musculoskeletal: She exhibits no edema.  Lymphadenopathy:    She has no cervical adenopathy.  Neurological: She is alert. She exhibits normal muscle tone.  Psychiatric: Her behavior is normal.  Vitals reviewed.   This patient misses work several days per month because of sometimes gastroparesis sometimes diabetes sometimes chronic low back pain she takes her pain medicine responsibly and takes it on a regular basis. She is had numerous injections of steroids over the past several months she feels that this is the reason her A1c is off      Assessment & Plan:  Diabetes subpar control she needs to do much better job and try to increase the amount of insulin to try to get her morning sugars around 100 120 in during the day less than 150. She will do her best to try to get this she will also send [name] readings periodically  Chronic pain pain contract was discussed pros and cons of taking medication discussed patient agreed to the pain management contract. Her prescriptions were given today urine drug screen collected  Greater than half the time spent discussing all of  these various issues regarding pain management as well as her diabetes.

## 2015-03-31 ENCOUNTER — Ambulatory Visit
Admission: RE | Admit: 2015-03-31 | Discharge: 2015-03-31 | Disposition: A | Discharge status: Home / Self Care | Payer: 59 | Source: Ambulatory Visit | Attending: Orthopedic Surgery | Admitting: Orthopedic Surgery

## 2015-03-31 DIAGNOSIS — M48061 Spinal stenosis, lumbar region without neurogenic claudication: Secondary | ICD-10-CM

## 2015-03-31 LAB — MICROALBUMIN, URINE: Microalbumin, Urine: 58.2 ug/mL

## 2015-03-31 MED ORDER — METHYLPREDNISOLONE ACETATE 40 MG/ML INJ SUSP (RADIOLOG
120.0000 mg | Freq: Once | INTRAMUSCULAR | Status: AC
  Administered 2015-03-31: 120 mg via EPIDURAL

## 2015-03-31 MED ORDER — IOHEXOL 180 MG/ML  SOLN
1.0000 mL | Freq: Once | INTRAMUSCULAR | Status: AC | PRN
  Administered 2015-03-31: 1 mL via EPIDURAL

## 2015-03-31 NOTE — Discharge Instructions (Signed)

## 2015-04-01 ENCOUNTER — Other Ambulatory Visit: Payer: Self-pay | Admitting: *Deleted

## 2015-04-01 NOTE — Patient Outreach (Signed)
Jamie Harding called in response to the letter she received in the mail advising her she was overdue for [facility] office visit. States she saw Dr. Wolfgang Phoenix this week and her A1C was 10.5%. She attributes the significant increase to steroid injections she has been receiving for trigger finger, hip, leg and back pain. She says Dr. Wolfgang Phoenix advised her to increase her insulin to help with glycemic control until her blood sugars improve. Follow up [facility] visit scheduled for mid June as she will see Dr. Wolfgang Phoenix again in August. Janet S. Hauser RN,CCM,CDE [place] [facility] Coordinator [facility] Office Phone [phone] Office Fax [phone]

## 2015-04-05 ENCOUNTER — Encounter: Payer: Self-pay | Admitting: Family Medicine

## 2015-04-05 LAB — TOXASSURE SELECT 13 (MW), URINE: PDF: 0

## 2015-04-27 ENCOUNTER — Other Ambulatory Visit: Payer: Self-pay | Admitting: Family Medicine

## 2015-04-27 NOTE — Telephone Encounter (Signed)
Refill all for 6 months. 

## 2015-05-09 ENCOUNTER — Other Ambulatory Visit: Payer: Self-pay | Admitting: *Deleted

## 2015-05-10 ENCOUNTER — Encounter: Payer: Self-pay | Admitting: *Deleted

## 2015-05-10 NOTE — Patient Outreach (Signed)
[facility]) Care Management   [birth year]  Jamie Harding [birth year] [id number]  Jamie Harding is an 59 y.o. female who presents for routine Link To Wellness follow up for self management assistance of Type II DM.   Subjective:  Jamie Harding says she has experienced several changes in her health history since her  last Link To Wellness visit. She had a cardiac cath on 2/15 after a cardiac stress test showed abnormalities. She says the cath showed only mild nonobstructive disease. Says she is still grieving over the loss of her step granddaughter who was stillborn at full term and the death of two pets.  Says she went to [place] with her sister to see her niece's new baby and had a good time and that helped with her grief. She says her A1C was significantly elevated when she saw Dr Wolfgang Phoenix in May and she attributes this to the stress of her other health issues and steroid injections in her hands and back.  Objective:   Review of Systems  Constitutional: Negative.    Filed Vitals:   05/09/15 1330  BP: 110/82   Filed Weights   05/09/15 0930 05/09/15 1330  Weight: 235 lb (106.595 kg) 235 lb (106.595 kg)    Physical Exam  Constitutional: She is oriented to person, place, and time. She appears well-developed and well-nourished.  Neurological: She is alert and oriented to person, place, and time.  Skin: Skin is warm and dry.     Psychiatric: She has a normal mood and affect. Her behavior is normal. Thought content normal.    Current Medications:   Current Outpatient Prescriptions  Medication Sig Dispense Refill  . ADVAIR DISKUS 250-50 MCG/DOSE AEPB INHALE 1 PUFF INTO THE LUNGS 2 (TWO) TIMES DAILY. 60 each 5  . ALPRAZolam (XANAX) 0.5 MG tablet TAKE 1 TABLET BY MOUTH TWICE DAILY AS NEEDED FOR ANXIETY 60 tablet 5  . amLODipine (NORVASC) 10 MG tablet Take 1 tablet (10 mg total) by mouth daily. 90 tablet 3  . aspirin EC 81 MG tablet Take 81 mg by mouth daily.    .  cetirizine (ZYRTEC) 10 MG tablet Take 1 tablet (10 mg total) by mouth daily. 30 tablet 6  . cyanocobalamin 1000 MCG tablet Take 1,000 mcg by mouth daily.     . Ergocalciferol (VITAMIN D2) 2000 UNITS TABS Take by mouth daily.    . fluticasone (FLONASE) 50 MCG/ACT nasal spray Place 2 sprays into both nostrils daily. 48 g 3  . furosemide (LASIX) 20 MG tablet Take 1 tablet (20 mg total) by mouth daily. 90 tablet 3  . gabapentin (NEURONTIN) 300 MG capsule one tablet q am, one tablet q afternoon and 2 tabs q evening 360 capsule 3  . glucose blood test strip Use as instructed 100 each 12  . HYDROcodone-acetaminophen (NORCO/VICODIN) 5-325 MG per tablet Take 1 tablet by mouth every 6 (six) hours as needed. 120 tablet 0  . Insulin Glargine (LANTUS SOLOSTAR) 100 UNIT/ML Solostar Pen INJECT 100 UNITS EVERY NIGHT AT BEDTIME 15 mL 0  . lisinopril (PRINIVIL,ZESTRIL) 20 MG tablet Take 1 tablet (20 mg total) by mouth daily. 90 tablet 3  . metFORMIN (GLUCOPHAGE) 1000 MG tablet Take 1 tablet (1,000 mg total) by mouth 2 (two) times daily. 180 tablet 3  . Multiple Vitamin (MULTIVITAMIN WITH MINERALS) TABS Take 1 tablet by mouth daily.    Marland Kitchen NOVOLOG FLEXPEN 100 UNIT/ML FlexPen USE THREE TIMES A DAY UNDER THE SKIN PER SLIDING SCALE (UP  TO 15 UNITS AT A TIME 15 mL 5  . ondansetron (ZOFRAN) 8 MG tablet TAKE 1 TABLET BY MOUTH EVERY 12 HOURS AS NEEDED FOR NAUSEA 20 tablet 2  . pantoprazole (PROTONIX) 40 MG tablet Take 1 tablet (40 mg total) by mouth 2 (two) times daily. 180 tablet 3  . promethazine (PHENERGAN) 25 MG tablet Take 1 tablet (25 mg total) by mouth every 8 (eight) hours as needed for nausea. 30 tablet 3  . rosuvastatin (CRESTOR) 10 MG tablet Take 1 tablet (10 mg total) by mouth daily. 90 tablet 3  . sitaGLIPtin (JANUVIA) 100 MG tablet Take 1 tablet (100 mg total) by mouth daily. 90 tablet 3  . UNIFINE PENTIPS 31G X 8 MM MISC USE AS DIRECTED THREE TIMES A DAY AS NEEDED 300 each 5  . venlafaxine (EFFEXOR) 75 MG  tablet Take 1 tablet (75 mg total) by mouth 2 (two) times daily with a meal. 180 tablet 3  . fluconazole (DIFLUCAN) 150 MG tablet TAKE ONE WEEKLY (Patient not taking: Reported on 05/09/2015) 6 tablet 5  . methocarbamol (ROBAXIN) 500 MG tablet Take 1 tablet (500 mg total) by mouth 2 (two) times daily as needed for muscle spasms. 60 tablet 5  . pyridOXINE (VITAMIN B-6) 100 MG tablet Take 100 mg by mouth daily.    . VENTOLIN HFA 108 (90 BASE) MCG/ACT inhaler INHALE 2 PUFFS INTO THE LUNGS EVERY 4 (FOUR) HOURS AS NEEDED FOR WHEEZING. 54 g 5   No current facility-administered medications for this visit.    Functional Status:   In your present state of health, do you have any difficulty performing the following activities: 05/09/2015 01/03/2015  Hearing? N N  Vision? N N  Difficulty concentrating or making decisions? N N  Walking or climbing stairs? - N  Dressing or bathing? N N  Doing errands, shopping? N -    Fall/Depression Screening:    PHQ 2/9 Scores 05/09/2015 12/02/2014  PHQ - 2 Score 4 0  PHQ- 9 Score 7 -   THN CM Care Plan Problem One        Patient Outreach from 05/09/2015 in [place] Problem One  Type II DM not meeting target A1C of <7.0% as evidenced by A1C= 10.5% on 03/30/15 at MD office   Care Plan for Problem One  Active   THN Long Term Goal (31-90 days)  Improved glycemic control as evidenced by pre and/or post prandial blood sugars meeting target at least 50% of the time and improved A1C  (<10.5%) at next assessment   [facility] Long Term Goal Start Date  05/09/15   Interventions for Problem One Long Term Goal  reviewed the 8 core pathophysiologic deficits in Type II diabetes. discussed physiology of diabetes as a chronic progressive disease , discussed role of obesity, especially central obesity, on insulin resistance, reviewed patient medications, discussed DM medications of Metformin, Lantus and Novolog, including the mechanism of action, common side effects,  dosages and dosing schedule, reinforced the importance of taking all medications as prescribed and being especially mindful of following the sliding scale provided by MD to improve glycemic control post steroid therapy, reviewed patient's CBG history, discussed blood glucose monitoring and interpretation, discussed recommended target ranges for pre-meal and post-meal,  advised patient that the True Result meter will be replaced by the True Metrix meter and can be picked up at no charge at the OP pharmacy, reviewed the effect of stress and steroids on blood sugar control  discussed strategies to manage stress such as exercise. hobbies, relaxation techniques, scheduling time away from work at regular intervals, getting adequate sleep,  reviewed the potential long term complications of prolonged elevated glucose levels on body systems,reviewed upcoming appointment with patient's primary care MD and reinforced the importance of keeping the appointment, will arrange for Link To Wellness fllow up after Jamie Harding sees  Dr. Wolfgang Phoenix on 06/29/15     Assessment:   [employer] employee and Link To Wellness member with Type II DM, currently not meeting A1C target of <7.0%.  Plan:  RNCM to fax today's office visit note to Dr. [name]. RNCM will meet quarterly and as needed with patient per Link To Wellness program guidelines to assist with Type II DM self-management and assess patient's progress toward mutually set goals.   Barrington Ellison RN,CCM,CDE [place] Management Coordinator Link To Wellness Office Phone [phone] Office Fax [phone]

## 2015-05-22 ENCOUNTER — Telehealth: Payer: 59 | Admitting: Physician Assistant

## 2015-05-22 DIAGNOSIS — G8929 Other chronic pain: Secondary | ICD-10-CM

## 2015-05-22 DIAGNOSIS — M549 Dorsalgia, unspecified: Principal | ICD-10-CM

## 2015-05-22 NOTE — Progress Notes (Signed)
Based on what you shared with me it looks like you have a serious condition that should be evaluated in a face to face office visit.  Your symptoms have been present for greater than 4 weeks.  E-visits are not equipped to treat subacute or chronic back pain.  You need a physical examination by your primary medical provider and imaging or referral to specialists giving severity and chronicity of pain.  You have prescriptions on your medication list for hydrocodone and gabapentin. Please take these as directed until you can see your primary care doctor. Limit any heavy lifting. If pain is severe and not controlled by your current pain medication regimen, please go to an Urgent Care or ER facility.  If you are having a true medical emergency please call 911.  If you need an urgent face to face visit, [facility] has four urgent care centers for your convenience.  . [facility] a Provider at this Location  [address] [place], [place] [address] . 8 am to 8 pm Monday-Friday . 9 am to 7 pm Saturday-Sunday  . [facility] at [place] a Provider at this Location  [address], [place], [place] [address] . 8 am to 8 pm Monday-Friday . 9 am to 6 pm Saturday . 11 am to 6 pm Sunday   . [facility] at [place] Get Driving Directions  [address] [place], [place] [address] . 8 am to 8 pm Monday-Friday . 9 am to 4 pm Saturday-Sunday   . [facility] (a walk in primary care provider)  [place] a Provider at this Location  [place],  [address] . 8 am to 8:30 pm Monday-Thursday . 8 am to 6 pm Friday . 8 am to 4 pm Saturday-Sunday   Your e-visit answers were reviewed by a board certified advanced clinical practitioner to complete your  personal care plan.  Depending on the condition, your plan could have included both over the counter or prescription medications.  You will get an e-mail in the next two days asking about your experience.  I hope that your e-visit has been valuable and will speed your recovery . Thank you for choosing an e-visit.

## 2015-05-30 ENCOUNTER — Encounter: Payer: Self-pay | Admitting: Family Medicine

## 2015-05-31 NOTE — Telephone Encounter (Signed)
Dear Jamie Harding, I'm sorry that you're hurting so bad, I will let the front staff know that you will be calling. I would like to see you in the near future to further evaluate your back hip and ankle and I can have a open discussion with you regarding your options regarding specialist and potential for other medications. We are more than happy to help, please give [name] a call, Dr. Nicki Reaper

## 2015-06-03 ENCOUNTER — Ambulatory Visit (INDEPENDENT_AMBULATORY_CARE_PROVIDER_SITE_OTHER): Payer: 59 | Admitting: Nurse Practitioner

## 2015-06-03 ENCOUNTER — Ambulatory Visit (HOSPITAL_COMMUNITY)
Admission: RE | Admit: 2015-06-03 | Discharge: 2015-06-03 | Disposition: A | Discharge status: Home / Self Care | Payer: 59 | Source: Ambulatory Visit | Attending: Nurse Practitioner | Admitting: Nurse Practitioner

## 2015-06-03 ENCOUNTER — Encounter: Payer: Self-pay | Admitting: Nurse Practitioner

## 2015-06-03 VITALS — BP 124/76 | Ht 62.0 in | Wt 234.0 lb

## 2015-06-03 DIAGNOSIS — M158 Other polyosteoarthritis: Secondary | ICD-10-CM | POA: Diagnosis not present

## 2015-06-03 DIAGNOSIS — M25552 Pain in left hip: Secondary | ICD-10-CM | POA: Insufficient documentation

## 2015-06-03 MED ORDER — MELOXICAM 15 MG PO TABS: 15.0000 mg | ORAL_TABLET | Freq: Every day | ORAL | Status: DC

## 2015-06-03 NOTE — Patient Instructions (Signed)
[facility] 

## 2015-06-06 ENCOUNTER — Encounter: Payer: Self-pay | Admitting: Nurse Practitioner

## 2015-06-06 NOTE — Progress Notes (Signed)
Subjective:   Presents as an urgent care visit for complaints of left hip and  Thigh pain that began yesterday. Has chronic neuropathic symptoms with the bottom of both feet feeling numb and painful. Currently on gabapentin and Robaxin. No history of injury.  Pain in the left low back area radiating along the lateral thigh to above the knee. Some numbness in the left lateral thigh area. Also some stiffness and continued trigger finger symptoms in the hands , has had surgery for this before. Mild shaking at times which she relates to her blood sugar. Lumbar pain with radiation down left leg is on her problem list from 2 years ago. Has had injections in the back in the past.  Objective:   BP 124/76 mmHg  Ht 5\' 2"  (1.575 m)  Wt 234 lb (106.142 kg)  BMI 42.79 kg/m2  NAD. Alert, oriented. Lungs clear. Heart regular rate rhythm. Positive left lumbar tenderness radiating into the left buttock and along the left hip into the thigh area. SLR negative on the right, positive on the left. Reflexes normal limit lower extremities. Tenderness with palpation and rotation of the left hip. Gait normal limit. Nodularity noted at the joints of her fingers with some stiffness. No tremor noted. See MRI of the lumbar spine dated  02/20/13.  Assessment: Left hip pain - Plan: DG HIP UNILAT WITH PELVIS 2-3 VIEWS LEFT, CANCELED: DG HIP UNILAT W OR W/O PELVIS MIN 4 VIEWS LEFT  Other osteoarthritis involving multiple joints  Morbid obesity   Plan:  Meds ordered this encounter  Medications  . DISCONTD: meloxicam (MOBIC) 15 MG tablet    Sig: Take 1 tablet (15 mg total) by mouth daily.    Dispense:  30 tablet    Refill:  0    Order Specific Question:  Supervising Provider    Answer:  Mikey Kirschner [[id number]]  . meloxicam (MOBIC) 15 MG tablet    Sig: Take 1 tablet (15 mg total) by mouth daily.    Dispense:  30 tablet    Refill:  0    Order Specific Question:  Supervising Provider    Answer:  Mikey Kirschner [[id number]]    Start mobic as directed. Take with food. DC if any acid reflux or heartburn. X-ray of left hip pending. Explained that most of her symptoms are coming from the low back area. Continue gabapentin as directed. Lengthy discussion regarding importance of significant weight loss, encouraged patient to consider bariatric surgery due to significant health issues. Either way, strongly recommend weight loss of 23 pounds or 10% of her body weight. Call back next week if no improvement in symptoms, sooner if worse.

## 2015-06-17 ENCOUNTER — Encounter: Payer: Self-pay | Admitting: Nurse Practitioner

## 2015-06-17 ENCOUNTER — Ambulatory Visit (INDEPENDENT_AMBULATORY_CARE_PROVIDER_SITE_OTHER): Payer: 59 | Admitting: Nurse Practitioner

## 2015-06-17 VITALS — BP 138/76 | Temp 98.3°F | Ht 62.0 in | Wt 234.0 lb

## 2015-06-17 DIAGNOSIS — R300 Dysuria: Secondary | ICD-10-CM | POA: Diagnosis not present

## 2015-06-17 DIAGNOSIS — IMO0002 Reserved for concepts with insufficient information to code with codable children: Secondary | ICD-10-CM

## 2015-06-17 DIAGNOSIS — E1165 Type 2 diabetes mellitus with hyperglycemia: Secondary | ICD-10-CM

## 2015-06-17 LAB — POCT GLUCOSE (DEVICE FOR HOME USE): Glucose Fasting, POC: 337 mg/dL — AB (ref 70–99)

## 2015-06-17 LAB — POCT URINALYSIS DIPSTICK
GLUCOSE UA: 50
Spec Grav, UA: >=1.03
pH, UA: 5

## 2015-06-17 MED ORDER — CIPROFLOXACIN HCL 500 MG PO TABS: 500.0000 mg | ORAL_TABLET | Freq: Two times a day (BID) | ORAL | Status: DC

## 2015-06-17 NOTE — Patient Instructions (Signed)
Take 2 Effexor 75 mg in the morning And one at night

## 2015-06-19 LAB — URINE CULTURE

## 2015-06-20 ENCOUNTER — Encounter: Payer: Self-pay | Admitting: Nurse Practitioner

## 2015-06-20 ENCOUNTER — Other Ambulatory Visit: Payer: Self-pay | Admitting: Nurse Practitioner

## 2015-06-20 LAB — POCT UA - MICROSCOPIC ONLY
Bacteria, U Microscopic: NEGATIVE
Epithelial cells, urine per micros: NEGATIVE
RBC, urine, microscopic: NEGATIVE

## 2015-06-20 MED ORDER — DOXYCYCLINE HYCLATE 100 MG PO TABS: 100.0000 mg | ORAL_TABLET | Freq: Two times a day (BID) | ORAL | Status: DC

## 2015-06-20 NOTE — Progress Notes (Signed)
Subjective:  Presents for complaints of urinary symptoms including dysuria, urgency, frequency and some incontinence. No fever. Headache at times, fatigue. Has had symptoms for about 4 days. Nausea, no vomiting. Blood sugars running 200-300 at home. Blood sugar here 337, has eaten a pop tart today. Has not done well with her diet. Not doing her sliding scale on a regular basis. Last UTI 02/02/2015. Minimal back or flank pain.  Objective:   BP 138/76 mmHg  Temp(Src) 98.3 F (36.8 C) (Oral)  Ht 5\' 2"  (1.575 m)  Wt 234 lb (106.142 kg)  BMI 42.79 kg/m2 NAD. Alert, oriented. Lungs clear. Heart regular rate rhythm. No CVA or flank tenderness. Abdomen soft nondistended with minimal suprapubic area tenderness. Urine micro-occasional to DC otherwise clear.   Assessment: Type 2 diabetes mellitus, uncontrolled - Plan: POCT Glucose (Device for Home Use)  Dysuria - Plan: POCT UA - Microscopic Only, POCT urinalysis dipstick, Urine culture  Plan:  Meds ordered this encounter  Medications  . ciprofloxacin (CIPRO) 500 MG tablet    Sig: Take 1 tablet (500 mg total) by mouth 2 (two) times daily.    Dispense:  14 tablet    Refill:  0    Order Specific Question:  Supervising Provider    Answer:  Mikey Kirschner [[id number]]   Start on Cipro for the weekend. Urine culture pending. Lengthy discussion of about 40 minutes reviewing diagnosis of diabetes and the importance of getting her sugars under control. Also if she has a UTI, this will make her sugars worse. Warning signs reviewed. Call back in 72 hours if no improvement, sooner if worse. Request patient send our office her blood sugars to make sure that they are improving.

## 2015-06-21 ENCOUNTER — Encounter: Payer: Self-pay | Admitting: Nurse Practitioner

## 2015-06-27 ENCOUNTER — Encounter: Payer: Self-pay | Admitting: Nurse Practitioner

## 2015-06-29 ENCOUNTER — Encounter: Payer: Self-pay | Admitting: Family Medicine

## 2015-06-29 ENCOUNTER — Ambulatory Visit (INDEPENDENT_AMBULATORY_CARE_PROVIDER_SITE_OTHER): Payer: 59 | Admitting: Family Medicine

## 2015-06-29 VITALS — BP 122/84 | Ht 62.0 in | Wt 234.0 lb

## 2015-06-29 DIAGNOSIS — E1165 Type 2 diabetes mellitus with hyperglycemia: Secondary | ICD-10-CM | POA: Diagnosis not present

## 2015-06-29 DIAGNOSIS — E785 Hyperlipidemia, unspecified: Secondary | ICD-10-CM

## 2015-06-29 DIAGNOSIS — M25572 Pain in left ankle and joints of left foot: Secondary | ICD-10-CM | POA: Diagnosis not present

## 2015-06-29 DIAGNOSIS — M545 Low back pain: Secondary | ICD-10-CM | POA: Diagnosis not present

## 2015-06-29 DIAGNOSIS — IMO0002 Reserved for concepts with insufficient information to code with codable children: Secondary | ICD-10-CM

## 2015-06-29 DIAGNOSIS — M79604 Pain in right leg: Secondary | ICD-10-CM

## 2015-06-29 MED ORDER — HYDROCODONE-ACETAMINOPHEN 5-325 MG PO TABS: 1.0000 | ORAL_TABLET | Freq: Four times a day (QID) | ORAL | Status: DC | PRN

## 2015-06-29 MED ORDER — MELOXICAM 15 MG PO TABS: 15.0000 mg | ORAL_TABLET | Freq: Every day | ORAL | Status: DC

## 2015-06-29 NOTE — Progress Notes (Signed)
   Subjective:    Patient ID: Jamie Harding, female    DOB: January 17, 1956, 59 y.o.   MRN: [id number]  HPI This patient was seen today for chronic pain  The medication list was reviewed and updated.   -Compliance with pain medication: yes  The patient was advised the importance of maintaining medication and not using illegal substances with these.  Refills needed: yes  The patient was educated that we can provide 3 monthly scripts for their medication, it is their responsibility to follow the instructions.  Side effects or complications from medications: yes  Patient is aware that pain medications are meant to minimize the severity of the pain to allow their pain levels to improve to allow for better function. They are aware of that pain medications cannot totally remove their pain.  Due for UDT ( at least once per year) : 03/2015  the patient comes in today for pain management but she also comes in for so much more she also has ongoing troubles with her diabetes. She states her sugar readings overall been fairly good. At times in the low 100s other time in 1:30 region. She denies any chest tightness pressure pain or shortness of breath she relates her gastroparesis is doing good minimal regurgitation issues no vomiting issues recently she states her feet are doing well. In addition to this she states at times adding some spells or tremors in her hands Patient having shaking spells at times there is nothing that seems to trigger this. It just comes out of the blue last for a few minutes then goes away she states that her sugar readings during these times are often fairly good.      Review of Systems  Constitutional: Negative for activity change, appetite change and fatigue.  HENT: Negative for congestion.   Respiratory: Negative for cough.   Cardiovascular: Negative for chest pain.  Gastrointestinal: Negative for abdominal pain.  Endocrine: Negative for polydipsia and polyphagia.    Neurological: Negative for weakness.  Psychiatric/Behavioral: Negative for confusion.    please see above.     Objective:   Physical Exam  Constitutional: She appears well-nourished. No distress.  Cardiovascular: Normal rate, regular rhythm and normal heart sounds.   No murmur heard. Pulmonary/Chest: Effort normal and breath sounds normal. No respiratory distress.  Musculoskeletal: She exhibits no edema.  Lymphadenopathy:    She has no cervical adenopathy.  Neurological: She is alert. She exhibits normal muscle tone.  Psychiatric: Her behavior is normal.  Vitals reviewed.         Assessment & Plan:  1. Type 2 diabetes mellitus, uncontrolled  hopefully her A1c looks good it sounds like her readings are doing good she is trying to watch how she area she is taking her medicines. - Hepatic function panel - Hemoglobin S9F - Basic metabolic panel  2. Lumbar pain with radiation down both legs  she continues have lumbar pain pain medicine helps keep it under reasonable control she denies overusing it  3. Hyperlipidemia  patient will get her lab work ordered. Watch diet continue medications - Lipid panel - Hepatic function panel  4. Left ankle pain  frequent left ankle pain she had surgery several years ago due to broken bone that they removed she could be developing arthritis there await the results of the x-ray - DG Ankle Complete Left

## 2015-06-30 ENCOUNTER — Ambulatory Visit (HOSPITAL_COMMUNITY)
Admission: RE | Admit: 2015-06-30 | Discharge: 2015-06-30 | Disposition: A | Discharge status: Home / Self Care | Payer: 59 | Source: Ambulatory Visit | Attending: Family Medicine | Admitting: Family Medicine

## 2015-06-30 ENCOUNTER — Encounter: Payer: Self-pay | Admitting: Orthopedic Surgery

## 2015-06-30 DIAGNOSIS — M25572 Pain in left ankle and joints of left foot: Secondary | ICD-10-CM | POA: Insufficient documentation

## 2015-07-01 LAB — HEPATIC FUNCTION PANEL
ALT: 17 IU/L (ref 0–32)
AST: 18 IU/L (ref 0–40)
Albumin: 4.3 g/dL (ref 3.5–5.5)
Alkaline Phosphatase: 82 IU/L (ref 39–117)
Bilirubin Total: 0.3 mg/dL (ref 0.0–1.2)
Bilirubin, Direct: 0.1 mg/dL (ref 0.00–0.40)
TOTAL PROTEIN: 7.3 g/dL (ref 6.0–8.5)

## 2015-07-01 LAB — BASIC METABOLIC PANEL
BUN/Creatinine Ratio: 12 (ref 9–23)
BUN: 10 mg/dL (ref 6–24)
CALCIUM: 9.6 mg/dL (ref 8.7–10.2)
CHLORIDE: 103 mmol/L (ref 97–108)
CO2: 23 mmol/L (ref 18–29)
CREATININE: 0.85 mg/dL (ref 0.57–1.00)
GFR calc Af Amer: 89 mL/min/{1.73_m2} (ref >59)
GFR calc non Af Amer: 77 mL/min/{1.73_m2} (ref >59)
GLUCOSE: 168 mg/dL — AB (ref 65–99)
Potassium: 4.5 mmol/L (ref 3.5–5.2)
Sodium: 143 mmol/L (ref 134–144)

## 2015-07-01 LAB — LIPID PANEL
CHOLESTEROL TOTAL: 175 mg/dL (ref 100–199)
Chol/HDL Ratio: 3.3 ratio units (ref 0.0–4.4)
HDL: 53 mg/dL (ref >39)
LDL Calculated: 99 mg/dL (ref 0–99)
TRIGLYCERIDES: 115 mg/dL (ref 0–149)
VLDL Cholesterol Cal: 23 mg/dL (ref 5–40)

## 2015-07-01 LAB — HEMOGLOBIN A1C
ESTIMATED AVERAGE GLUCOSE: 223 mg/dL
Hgb A1c MFr Bld: 9.4 % — ABNORMAL HIGH (ref 4.8–5.6)

## 2015-07-01 NOTE — Addendum Note (Signed)
Addended by: Carmelina Noun on: 07/01/2015 11:02 AM   Modules accepted: Orders

## 2015-08-02 ENCOUNTER — Other Ambulatory Visit: Payer: Self-pay | Admitting: Family Medicine

## 2015-09-02 ENCOUNTER — Other Ambulatory Visit: Payer: Self-pay | Admitting: Family Medicine

## 2015-09-05 ENCOUNTER — Ambulatory Visit (INDEPENDENT_AMBULATORY_CARE_PROVIDER_SITE_OTHER): Payer: 59 | Admitting: "Endocrinology

## 2015-09-05 ENCOUNTER — Encounter: Payer: Self-pay | Admitting: "Endocrinology

## 2015-09-05 VITALS — BP 147/87 | HR 81 | Ht 62.0 in | Wt 235.0 lb

## 2015-09-05 DIAGNOSIS — E1159 Type 2 diabetes mellitus with other circulatory complications: Secondary | ICD-10-CM | POA: Diagnosis not present

## 2015-09-05 DIAGNOSIS — I10 Essential (primary) hypertension: Secondary | ICD-10-CM

## 2015-09-05 DIAGNOSIS — E785 Hyperlipidemia, unspecified: Secondary | ICD-10-CM | POA: Diagnosis not present

## 2015-09-05 NOTE — Progress Notes (Signed)
Subjective:    Patient ID: Jamie Harding, female    DOB: 10/13/1956,    Past Medical History  Diagnosis Date  . DM (diabetes mellitus) ([place])   . HTN (hypertension)   . Obese   . Depression   . Anxiety   . HLD (hyperlipidemia)   . Hiatal hernia   . Asthma   . GERD (gastroesophageal reflux disease)   . Fatty liver   . Gastritis   . Pancreatitis   . Tibialis tendinitis   . Chest pain     Nuclear  February, 2012,,  breast attenuation, no definite scar or ischemia  . Gastroparesis     NEGATIVE GES 2010  . Rheumatoid arthritis(714.0)   . Stroke [facility]) 2008    no deficits  . Neuropathy ([place])   . Ejection fraction     EF 60%, echo, February, 2012, mild LVH  . Family history of anesthesia complication     post anesthesia N/V   Past Surgical History  Procedure Laterality Date  . Cesarean section    . Cholecystectomy    . Ankle surgery      remove extra bone-left  . Finger surgery      left little finger  . Lithotripsy    . Dilation and curettage of uterus      multiple  . Laser ablation of the cervix    . Carpal tunnel release      right side  . Tubal ligation    . Gastric emptying  07/27/2009    The amount of activity in the stomach at 120 minutes was 13% which is in the normal range  . Esophagogastroduodenoscopy  07/29/2009    Mild gastritis, benign path  . Colonoscopy with propofol  09/16/2012    EUM:PNTI sessile polyps ranging between 3-37mm in size were found in the sigmoid colon and rectum; polypectomy was performed/Mild diverticulosis was noted throughout the entire examined colon/Small internal hemorrhoids  . Esophagogastroduodenoscopy (egd) with propofol  09/16/2012    SLF:Non-erosive gastritis (inflammation) was found; multiple bx/The duodenal mucosa showed no abnormalities in the ampulla and bulb and second portion of the duodenum/NAUSEA/VOMITING MOST LIKELY DUE TO GERD/GASTRITIS  . Savory dilation  09/16/2012    Procedure: SAVORY DILATION;  Surgeon: Danie Binder, MD;  Location: [facility];  Service: Endoscopy;  Laterality: N/A;  16 fr dilation  . Polypectomy  09/16/2012    Procedure: POLYPECTOMY;  Surgeon: Danie Binder, MD;  Location: [facility];  Service: Endoscopy;  Laterality: N/A;  . Carpal tunnel release Left 02/06/2013    Procedure: CARPAL TUNNEL RELEASE ;  Surgeon: Carole Civil, MD;  Location: [facility];  Service: Orthopedics;  Laterality: Left;  procedure end 1135  . Trigger finger release Left 02/06/2013    Procedure: RELEASE TRIGGER FINGER LEFT LONG FINGER/A-1 PULLEY;  Surgeon: Carole Civil, MD;  Location: [facility];  Service: Orthopedics;  Laterality: Left;  procedure began 1136  . Left and right heart catheterization with coronary angiogram N/A 01/03/2015    Procedure: LEFT AND RIGHT HEART CATHETERIZATION WITH CORONARY ANGIOGRAM;  Surgeon: Blane Ohara, MD;  Location: [facility] CATH LAB;  Service: Cardiovascular;  Laterality: N/A;   Social History   Social History  . Marital Status: Married    Spouse Name: N/A  . Number of Children: N/A  . Years of Education: N/A   Occupational History  . 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  Social History Main Topics  . Smoking status: Former Smoker -- 0.50 packs/day for 3 years    Types: Cigarettes    Quit date: 09/11/1987  . Smokeless tobacco: None  . Alcohol Use: No  . Drug Use: No  . Sexual Activity: Yes    Birth Control/ Protection: Surgical   Other Topics Concern  . None   Social History Narrative   Outpatient Encounter Prescriptions as of 09/05/2015  Medication Sig  . ADVAIR DISKUS 250-50 MCG/DOSE AEPB INHALE 1 PUFF BY MOUTH 2 TIMES DAILY.  Marland Kitchen ALPRAZolam (XANAX) 0.5 MG tablet TAKE 1 TABLET BY MOUTH TWICE DAILY AS NEEDED FOR ANXIETY  . amLODipine (NORVASC) 10 MG tablet Take 1 tablet (10 mg total) by mouth daily.  Marland Kitchen aspirin EC 81 MG tablet Take 81 mg by mouth daily.  . cetirizine (ZYRTEC) 10 MG tablet Take 1 tablet (10 mg total) by mouth daily.  .  cyanocobalamin 1000 MCG tablet Take 1,000 mcg by mouth daily.   Marland Kitchen doxycycline (VIBRA-TABS) 100 MG tablet Take 1 tablet (100 mg total) by mouth 2 (two) times daily.  . Ergocalciferol (VITAMIN D2) 2000 UNITS TABS Take by mouth daily.  . fluticasone (FLONASE) 50 MCG/ACT nasal spray Place 2 sprays into both nostrils daily.  . furosemide (LASIX) 20 MG tablet Take 1 tablet (20 mg total) by mouth daily.  Marland Kitchen gabapentin (NEURONTIN) 300 MG capsule one tablet q am, one tablet q afternoon and 2 tabs q evening  . glucose blood test strip Use as instructed  . HYDROcodone-acetaminophen (NORCO/VICODIN) 5-325 MG per tablet Take 1 tablet by mouth every 6 (six) hours as needed.  . Insulin Glargine (LANTUS SOLOSTAR) 100 UNIT/ML Solostar Pen INJECT 100 UNITS EVERY NIGHT AT BEDTIME  . lisinopril (PRINIVIL,ZESTRIL) 20 MG tablet Take 1 tablet (20 mg total) by mouth daily.  . meloxicam (MOBIC) 15 MG tablet Take 1 tablet (15 mg total) by mouth daily.  . metFORMIN (GLUCOPHAGE) 1000 MG tablet Take 1 tablet (1,000 mg total) by mouth 2 (two) times daily.  . methocarbamol (ROBAXIN) 500 MG tablet Take 1 tablet (500 mg total) by mouth 2 (two) times daily as needed for muscle spasms.  . Multiple Vitamin (MULTIVITAMIN WITH MINERALS) TABS Take 1 tablet by mouth daily.  Marland Kitchen NOVOLOG FLEXPEN 100 UNIT/ML FlexPen USE THREE TIMES A DAY UNDER THE SKIN PER SLIDING SCALE (UP TO 15 UNITS AT A TIME  . ondansetron (ZOFRAN) 8 MG tablet TAKE 1 TABLET BY MOUTH EVERY 12 HOURS AS NEEDED FOR NAUSEA  . pantoprazole (PROTONIX) 40 MG tablet Take 1 tablet (40 mg total) by mouth 2 (two) times daily.  . promethazine (PHENERGAN) 25 MG tablet Take 1 tablet (25 mg total) by mouth every 8 (eight) hours as needed for nausea.  . promethazine (PHENERGAN) 25 MG tablet TAKE 1 TABLET BY MOUTH EVERY 8 HOURS AS NEEDED FOR NAUSEA.  Marland Kitchen pyridOXINE (VITAMIN B-6) 100 MG tablet Take 100 mg by mouth daily.  . rosuvastatin (CRESTOR) 10 MG tablet Take 1 tablet (10 mg total) by  mouth daily.  . sitaGLIPtin (JANUVIA) 100 MG tablet Take 1 tablet (100 mg total) by mouth daily.  Marland Kitchen UNIFINE PENTIPS 31G X 8 MM MISC USE AS DIRECTED THREE TIMES A DAY AS NEEDED  . venlafaxine (EFFEXOR) 75 MG tablet Take 1 tablet (75 mg total) by mouth 2 (two) times daily with a meal.  . VENTOLIN HFA 108 (90 BASE) MCG/ACT inhaler INHALE 2 PUFFS INTO THE LUNGS EVERY 4 (FOUR) HOURS AS NEEDED FOR  WHEEZING.   No facility-administered encounter medications on file as of 09/05/2015.   ALLERGIES: Allergies  Allergen Reactions  . Byetta 10 Mcg Pen [Exenatide] Diarrhea and Nausea And Vomiting      touch of pancreatis  . Naproxen Nausea And Vomiting  . Augmentin [Amoxicillin-Pot Clavulanate]     diarrhea  . Azithromycin Nausea And Vomiting and Rash  . Erythromycin Hives       . Invokana [Canagliflozin]     Urinary issues  . Morphine Hives and Rash  . Sulfonamide Derivatives Nausea And Vomiting and Rash   VACCINATION STATUS: Immunization History  Administered Date(s) Administered  . Influenza-Unspecified 07/20/2014  . Pneumococcal-Unspecified 08/26/2007  . Td 03/31/2007    Diabetes She presents for her follow-up diabetic visit. She has type 2 diabetes mellitus. Onset time: She was diagnosed at approximate age of 54 years. Her disease course has been worsening. There are no hypoglycemic associated symptoms. Pertinent negatives for hypoglycemia include no confusion, headaches, pallor or seizures. Associated symptoms include polydipsia, polyuria and visual change. Pertinent negatives for diabetes include no chest pain, no fatigue and no polyphagia. There are no hypoglycemic complications. Symptoms are worsening. Diabetic complications include a CVA and retinopathy. Risk factors for coronary artery disease include family history, dyslipidemia, diabetes mellitus, obesity, tobacco exposure and sedentary lifestyle. Current diabetic treatment includes intensive insulin program and oral agent (dual  therapy). Her weight is fluctuating minimally. She has not had a previous visit with a dietitian. She rarely participates in exercise. Home blood sugar record trend: She did not bring any logs nor meter. An ACE inhibitor/angiotensin II receptor blocker is being taken. Eye exam is current (She has reported history of retinopathy.).  Hyperlipidemia This is a chronic problem. The current episode started more than 1 year ago. Exacerbating diseases include diabetes and obesity. Pertinent negatives include no chest pain, myalgias or shortness of breath. Current antihyperlipidemic treatment includes statins. Risk factors for coronary artery disease include dyslipidemia, diabetes mellitus, hypertension and obesity.  Hypertension This is a chronic problem. The current episode started more than 1 year ago. Pertinent negatives include no chest pain, headaches, palpitations or shortness of breath. Risk factors for coronary artery disease include dyslipidemia, diabetes mellitus, obesity, smoking/tobacco exposure and sedentary lifestyle. Past treatments include ACE inhibitors. Hypertensive end-organ damage includes CVA and retinopathy.    The patient presents with a medical history as above. The patient is being seen in consultation for currently uncontrolled type 2 DM requested by Dr. Wolfgang Phoenix.  Patient was diagnosed with type  2 DM approximately at age of 45 years years. Patient's recent A1c was found to be 9.4 % consistent with uncontrolled status.  Patient reports moderate degree of polydipsia, polyuria, and nocturia. Patient is currently on Lantus 60 units 3 times a day, NovoLog 15 units 4 times a day plus correction, metformin 1000 mg twice a day, and Januvia 100 mg by mouth daily  The patient did not bring their meter, log book, and is not monitoring BG regularly. The patient is also being treated for hypertension, hyperlipidemia and has been overweight to obese most of their adult life. Patient  has history of  CVA without residual weakness, neuropathy and retinopathy; denies history of CAD, CKD. patient has a family history of type 2 DM in her father, siblings, and grandparents. Patient is a former smoker, does not  participate in a regular exercise program. Patient is willing to engage in intensive monitoring and therapy along with change in life style.   Review of  Systems  Constitutional: Negative for fatigue and unexpected weight change.  HENT: Negative for trouble swallowing and voice change.   Eyes: Negative for visual disturbance.  Respiratory: Negative for cough, shortness of breath and wheezing.   Cardiovascular: Negative for chest pain, palpitations and leg swelling.  Gastrointestinal: Negative for nausea, vomiting and diarrhea.  Endocrine: Positive for polydipsia and polyuria. Negative for cold intolerance, heat intolerance and polyphagia.  Musculoskeletal: Negative for myalgias and arthralgias.  Skin: Negative for color change, pallor, rash and wound.  Neurological: Negative for seizures and headaches.  Psychiatric/Behavioral: Negative for suicidal ideas and confusion.    Objective:    BP 147/87 mmHg  Pulse 81  Ht 5\' 2"  (1.575 m)  Wt 235 lb (106.595 kg)  BMI 42.97 kg/m2  SpO2 97%  Wt Readings from Last 3 Encounters:  09/05/15 235 lb (106.595 kg)  06/29/15 234 lb (106.142 kg)  06/17/15 234 lb (106.142 kg)    Physical Exam  Constitutional: She is oriented to person, place, and time. She appears well-developed.  HENT:  Head: Normocephalic and atraumatic.  Eyes: EOM are normal.  Neck: Normal range of motion. Neck supple. No tracheal deviation present. No thyromegaly present.  Cardiovascular: Normal rate and regular rhythm.   Pulmonary/Chest: Effort normal and breath sounds normal.  Abdominal: Soft. Bowel sounds are normal. There is no tenderness. There is no guarding.  Musculoskeletal: Normal range of motion. She exhibits no edema.  Neurological: She is alert and oriented  to person, place, and time. She has normal reflexes. No cranial nerve deficit. Coordination normal.  Skin: Skin is warm and dry. No rash noted. No erythema. No pallor.  Psychiatric: She has a normal mood and affect. Judgment normal.    Results for orders placed or performed in visit on 06/29/15  Lipid panel  Result Value Ref Range   Cholesterol, Total 175 100 - 199 mg/dL   Triglycerides 115 0 - 149 mg/dL   HDL 53 >39 mg/dL   VLDL Cholesterol Cal 23 5 - 40 mg/dL   LDL Calculated 99 0 - 99 mg/dL   Chol/HDL Ratio 3.3 0.0 - 4.4 ratio units  Hepatic function panel  Result Value Ref Range   Total Protein 7.3 6.0 - 8.5 g/dL   Albumin 4.3 3.5 - 5.5 g/dL   Bilirubin Total 0.3 0.0 - 1.2 mg/dL   Bilirubin, Direct 0.10 0.00 - 0.40 mg/dL   Alkaline Phosphatase 82 39 - 117 IU/L   AST 18 0 - 40 IU/L   ALT 17 0 - 32 IU/L  Hemoglobin A1c  Result Value Ref Range   Hgb A1c MFr Bld 9.4 (H) 4.8 - 5.6 %   Est. average glucose Bld gHb Est-mCnc 223 mg/dL  Basic metabolic panel  Result Value Ref Range   Glucose 168 (H) 65 - 99 mg/dL   BUN 10 6 - 24 mg/dL   Creatinine, Ser 0.85 0.57 - 1.00 mg/dL   GFR calc non Af Amer 77 >59 mL/min/1.73   GFR calc Af Amer 89 >59 mL/min/1.73   BUN/Creatinine Ratio 12 9 - 23   Sodium 143 134 - 144 mmol/L   Potassium 4.5 3.5 - 5.2 mmol/L   Chloride 103 97 - 108 mmol/L   CO2 23 18 - 29 mmol/L   Calcium 9.6 8.7 - 10.2 mg/dL   Complete Blood Count (Most recent): Lab Results  Component Value Date   WBC 8.1 12/23/2014   HGB 14.1 12/23/2014   HCT 40.8 12/23/2014   MCV 83.2 12/23/2014  PLT 274.0 12/23/2014   Chemistry (most recent): Lab Results  Component Value Date   NA 143 06/30/2015   K 4.5 06/30/2015   CL 103 06/30/2015   CO2 23 06/30/2015   BUN 10 06/30/2015   CREATININE 0.85 06/30/2015   Diabetic Labs (most recent): Lab Results  Component Value Date   HGBA1C 9.4* 06/30/2015   HGBA1C 10.5 03/30/2015   HGBA1C 9.3* 12/09/2014   Lipid profile (most  recent): Lab Results  Component Value Date   TRIG 115 06/30/2015   CHOL 175 06/30/2015         Assessment & Plan:   1. Type 2 diabetes mellitus with vascular disease ([place]) Her diabetes is complicated by CVA. Marland Kitchen Recent labs are reviewed. -Patient remains at a high risk for more acute and chronic complications of diabetes which include CAD, CVA, CKD, retinopathy, and neuropathy. These are all discussed in detail with the patient.  - I have counseled the patient on diet management and weight loss, by adopting a carbohydrate restricted/protein rich diet.  - Patient is advised to stick to a routine mealtimes to eat 3 meals  a day and avoid unnecessary snacks ( to snack only to correct hypoglycemia).   - Suggestion is made for patient to avoid simple carbohydrates   from their diet including Cakes , Desserts, Ice Cream,  Soda (  diet and regular) , Sweet Tea , Candies,  Chips, Cookies, Artificial Sweeteners,   and "Sugar-free" Products . This will help patient to have stable blood glucose profile and potentially avoid unintended weight gain.  - I encouraged the patient to switch to  unprocessed or minimally processed complex starch and increased protein intake (animal or plant source), fruits, and vegetables.  - The patient will be scheduled with Jearld Fenton, RDN, CDE for individualized DM education.  - I have approached patient with the following individualized plan to manage diabetes and patient agrees:   -She may require the high-dose insulin she is currently on however since she did not bring any meter nor log book 80s unsafe to continue the high-dose insulin without her commitment for adequate monitoring.  - I  will proceed to readjust her basal insulin Lantus to 60 units  QHS, and prandial insulin NovoLog 15 units  TIDAC for pre-meal BG readings of 90-150mg /dl, plus patient specific correction dose for unexpected hyperglycemia above 150mg /dl, associated with strict monitoring of  glucose  AC and HS. - Patient is warned not to take insulin without proper monitoring per orders. -Adjustment parameters are given for hypo and hyperglycemia in writing. -Patient is encouraged to call clinic for blood glucose levels less than 70 or above 300 mg /dl. - I will continue metformin 1000 mg by mouth twice a day and Januvia 100 mg by mouth every a.m., therapeutically suitable for patient.. -He reports severe side effects from Invokana and Byetta.   - Patient specific target  A1c;  LDL, HDL, Triglycerides, and  Waist Circumference were discussed in detail.  2) BP/HTN: Controlled. Continue current medications including ACEI. 3) Lipids/HPL: continue statins. 4)  Weight/Diet: CDE consult in progress, exercise, and carbohydrates information provided.  5) Chronic Care/Health Maintenance:  -Patient is on ACEI and Statin medications and encouraged to continue to follow up with Ophthalmology, Podiatrist at least yearly or according to recommendations, and advised to   stay away from smoking. I have recommended yearly flu vaccine and pneumonia vaccination at least every 5 years; moderate intensity exercise for up to 150 minutes weekly; and  sleep for at least 7 hours a day.   Patient to bring meter and  blood glucose logs during their next visit.   I advised patient to maintain close follow up with their PCP for primary care needs. Follow up plan: Return in about 1 week (around 09/12/2015) for diabetes, high blood pressure, high cholesterol, follow up with pre-visit labs, meter, and logs.  Glade Lloyd, MD Phone: [phone]  Fax: [phone]   09/05/2015, 8:44 PM

## 2015-09-05 NOTE — Patient Instructions (Signed)

## 2015-09-06 ENCOUNTER — Ambulatory Visit (INDEPENDENT_AMBULATORY_CARE_PROVIDER_SITE_OTHER): Payer: 59 | Admitting: Orthopedic Surgery

## 2015-09-06 ENCOUNTER — Encounter: Payer: Self-pay | Admitting: Orthopedic Surgery

## 2015-09-06 VITALS — Ht 62.0 in | Wt 235.0 lb

## 2015-09-06 DIAGNOSIS — G5603 Carpal tunnel syndrome, bilateral upper limbs: Secondary | ICD-10-CM | POA: Diagnosis not present

## 2015-09-06 DIAGNOSIS — M6530 Trigger finger, unspecified finger: Secondary | ICD-10-CM | POA: Diagnosis not present

## 2015-09-06 NOTE — Patient Instructions (Signed)
SURGERY 09/14/15 BILATERAL CARPAL TUNNEL RELEASE RIGHT LONG TRIGGER FINGER RELEASE

## 2015-09-06 NOTE — Progress Notes (Signed)
Patient ID: Jamie Harding, female   DOB: Jan 13, 1956, 59 y.o.   MRN: [id number]  ESTABLISHED PATIENT NEW PROBLEM   Chief Complaint  Patient presents with  . Follow-up    follow up bilateral carpal tunnel syndrome RT>LT      Jamie Harding is a 59 y.o. female.   HPI 59 year old female presents with a history of bilateral carpal tunnel release ongoing problems with trigger finger status post trigger release on the left and current right long finger triggering. She is a [employer] certified accountant she complains of the following: Bilateral cramping numbness and tingling both hands radiating up towards the elbow but starting in the wrist and hand associated with nocturnal symptoms and dropping things. Catching locking right long finger as well.  Review of systems currently no fever chills nausea vomiting diarrhea. Other systems are quiescent   Review of Systems See hpi  Past Medical History  Diagnosis Date  . DM (diabetes mellitus) ([place])   . HTN (hypertension)   . Obese   . Depression   . Anxiety   . HLD (hyperlipidemia)   . Hiatal hernia   . Asthma   . GERD (gastroesophageal reflux disease)   . Fatty liver   . Gastritis   . Pancreatitis   . Tibialis tendinitis   . Chest pain     Nuclear  February, 2012,,  breast attenuation, no definite scar or ischemia  . Gastroparesis     NEGATIVE GES 2010  . Rheumatoid arthritis(714.0)   . Stroke [facility]) 2008    no deficits  . Neuropathy ([place])   . Ejection fraction     EF 60%, echo, February, 2012, mild LVH  . Family history of anesthesia complication     post anesthesia N/V    Past Surgical History  Procedure Laterality Date  . Cesarean section    . Cholecystectomy    . Ankle surgery      remove extra bone-left  . Finger surgery      left little finger  . Lithotripsy    . Dilation and curettage of uterus      multiple  . Laser ablation of the cervix    . Carpal tunnel release      right side  . Tubal ligation    . Gastric emptying   07/27/2009    The amount of activity in the stomach at 120 minutes was 13% which is in the normal range  . Esophagogastroduodenoscopy  07/29/2009    Mild gastritis, benign path  . Colonoscopy with propofol  09/16/2012    KCL:EXNT sessile polyps ranging between 3-80mm in size were found in the sigmoid colon and rectum; polypectomy was performed/Mild diverticulosis was noted throughout the entire examined colon/Small internal hemorrhoids  . Esophagogastroduodenoscopy (egd) with propofol  09/16/2012    SLF:Non-erosive gastritis (inflammation) was found; multiple bx/The duodenal mucosa showed no abnormalities in the ampulla and bulb and second portion of the duodenum/NAUSEA/VOMITING MOST LIKELY DUE TO GERD/GASTRITIS  . Savory dilation  09/16/2012    Procedure: SAVORY DILATION;  Surgeon: Danie Binder, MD;  Location: [facility];  Service: Endoscopy;  Laterality: N/A;  16 fr dilation  . Polypectomy  09/16/2012    Procedure: POLYPECTOMY;  Surgeon: Danie Binder, MD;  Location: [facility];  Service: Endoscopy;  Laterality: N/A;  . Carpal tunnel release Left 02/06/2013    Procedure: CARPAL TUNNEL RELEASE ;  Surgeon: Carole Civil, MD;  Location: [facility];  Service: Orthopedics;  Laterality: Left;  procedure  end 1135  . Trigger finger release Left 02/06/2013    Procedure: RELEASE TRIGGER FINGER LEFT LONG FINGER/A-1 PULLEY;  Surgeon: Carole Civil, MD;  Location: [facility];  Service: Orthopedics;  Laterality: Left;  procedure began 1136  . Left and right heart catheterization with coronary angiogram N/A 01/03/2015    Procedure: LEFT AND RIGHT HEART CATHETERIZATION WITH CORONARY ANGIOGRAM;  Surgeon: Blane Ohara, MD;  Location: [facility] CATH LAB;  Service: Cardiovascular;  Laterality: N/A;    Family History  Problem Relation Age of Onset  . Breast cancer Mother   . Diabetes Father   . Coronary artery disease    . Arthritis    . Asthma    . Colon cancer Neg Hx   . Diabetes Sister     Social History Social  History  Substance Use Topics  . Smoking status: Former Smoker -- 0.50 packs/day for 3 years    Types: Cigarettes    Quit date: 09/11/1987  . Smokeless tobacco: Not on file  . Alcohol Use: No    Allergies  Allergen Reactions  . Byetta 10 Mcg Pen [Exenatide] Diarrhea and Nausea And Vomiting      touch of pancreatis  . Naproxen Nausea And Vomiting  . Augmentin [Amoxicillin-Pot Clavulanate]     diarrhea  . Azithromycin Nausea And Vomiting and Rash  . Erythromycin Hives       . Invokana [Canagliflozin]     Urinary issues  . Morphine Hives and Rash  . Sulfonamide Derivatives Nausea And Vomiting and Rash    Current Outpatient Prescriptions  Medication Sig Dispense Refill  . ADVAIR DISKUS 250-50 MCG/DOSE AEPB INHALE 1 PUFF BY MOUTH 2 TIMES DAILY. 60 each 5  . ALPRAZolam (XANAX) 0.5 MG tablet TAKE 1 TABLET BY MOUTH TWICE DAILY AS NEEDED FOR ANXIETY 60 tablet 5  . amLODipine (NORVASC) 10 MG tablet Take 1 tablet (10 mg total) by mouth daily. 90 tablet 3  . aspirin EC 81 MG tablet Take 81 mg by mouth daily.    . cetirizine (ZYRTEC) 10 MG tablet Take 1 tablet (10 mg total) by mouth daily. 30 tablet 6  . cyanocobalamin 1000 MCG tablet Take 1,000 mcg by mouth daily.     . Ergocalciferol (VITAMIN D2) 2000 UNITS TABS Take by mouth daily.    . fluticasone (FLONASE) 50 MCG/ACT nasal spray Place 2 sprays into both nostrils daily. 48 g 3  . furosemide (LASIX) 20 MG tablet Take 1 tablet (20 mg total) by mouth daily. 90 tablet 3  . gabapentin (NEURONTIN) 300 MG capsule one tablet q am, one tablet q afternoon and 2 tabs q evening 360 capsule 3  . glucose blood test strip Use as instructed 100 each 12  . HYDROcodone-acetaminophen (NORCO/VICODIN) 5-325 MG per tablet Take 1 tablet by mouth every 6 (six) hours as needed. 120 tablet 0  . Insulin Glargine (LANTUS SOLOSTAR) 100 UNIT/ML Solostar Pen INJECT 100 UNITS EVERY NIGHT AT BEDTIME 15 mL 0  . lisinopril (PRINIVIL,ZESTRIL) 20 MG tablet Take 1 tablet  (20 mg total) by mouth daily. 90 tablet 3  . meloxicam (MOBIC) 15 MG tablet Take 1 tablet (15 mg total) by mouth daily. 90 tablet 0  . metFORMIN (GLUCOPHAGE) 1000 MG tablet Take 1 tablet (1,000 mg total) by mouth 2 (two) times daily. 180 tablet 3  . methocarbamol (ROBAXIN) 500 MG tablet Take 1 tablet (500 mg total) by mouth 2 (two) times daily as needed for muscle spasms. 60 tablet 5  .  Multiple Vitamin (MULTIVITAMIN WITH MINERALS) TABS Take 1 tablet by mouth daily.    Marland Kitchen NOVOLOG FLEXPEN 100 UNIT/ML FlexPen USE THREE TIMES A DAY UNDER THE SKIN PER SLIDING SCALE (UP TO 15 UNITS AT A TIME 15 mL 5  . ondansetron (ZOFRAN) 8 MG tablet TAKE 1 TABLET BY MOUTH EVERY 12 HOURS AS NEEDED FOR NAUSEA 20 tablet 2  . pantoprazole (PROTONIX) 40 MG tablet Take 1 tablet (40 mg total) by mouth 2 (two) times daily. 180 tablet 3  . promethazine (PHENERGAN) 25 MG tablet TAKE 1 TABLET BY MOUTH EVERY 8 HOURS AS NEEDED FOR NAUSEA. 30 tablet 2  . pyridOXINE (VITAMIN B-6) 100 MG tablet Take 100 mg by mouth daily.    . rosuvastatin (CRESTOR) 10 MG tablet Take 1 tablet (10 mg total) by mouth daily. 90 tablet 3  . sitaGLIPtin (JANUVIA) 100 MG tablet Take 1 tablet (100 mg total) by mouth daily. 90 tablet 3  . UNIFINE PENTIPS 31G X 8 MM MISC USE AS DIRECTED THREE TIMES A DAY AS NEEDED 300 each 5  . venlafaxine (EFFEXOR) 75 MG tablet Take 1 tablet (75 mg total) by mouth 2 (two) times daily with a meal. 180 tablet 3  . VENTOLIN HFA 108 (90 BASE) MCG/ACT inhaler INHALE 2 PUFFS INTO THE LUNGS EVERY 4 (FOUR) HOURS AS NEEDED FOR WHEEZING. 54 g 5   No current facility-administered medications for this visit.       Physical Exam Height 5\' 2"  (1.575 m), weight 235 lb (106.595 kg). Physical Exam The patient is well developed well nourished and well groomed. Orientation to person place and time is normal  Mood is pleasant. Ambulatory status is normal without a limp  Upper extremity examination Skin remains intact without  laceration ulceration or erythema. Skin incisions over the right and left carpal tunnel healed well no neuropathy or neuromas. No tenderness or tingling with Tinel's sign. Left hand trigger release long finger.   Gross motor exam is intact without atrophy. Muscle tone normal grade 5 motor strength Neurovascular exam remains intact Inspection mouth swelling both hands all digits. Tenderness over the carpal tunnel positive compression test bilaterally. Slight decreased range of motion small joints of the hand and inability make tight fist. Joints are stable.  Right long finger tenderness over the A1 pulley  Epitrochlear lymph nodes normal bilaterally.  Lower extremity is not involved.   Data Reviewed  I have independently reviewed the radiographs and my interpretation is:  Previous notes and carpal tunnel surgery notes reviewed. Nothing to add.   Assessment   Bilateral carpal tunnel syndrome and right long finger triggering.   Plan   Bilateral carpal tunnel release and right long finger trigger release

## 2015-09-07 ENCOUNTER — Telehealth: Payer: Self-pay | Admitting: Orthopedic Surgery

## 2015-09-07 DIAGNOSIS — Z01818 Encounter for other preprocedural examination: Secondary | ICD-10-CM | POA: Diagnosis present

## 2015-09-07 DIAGNOSIS — M65331 Trigger finger, right middle finger: Secondary | ICD-10-CM | POA: Diagnosis not present

## 2015-09-07 DIAGNOSIS — G5601 Carpal tunnel syndrome, right upper limb: Secondary | ICD-10-CM | POA: Diagnosis not present

## 2015-09-07 NOTE — Patient Instructions (Signed)
Jamie Harding  [id number]     @PREFPERIOPPHARMACY @   Your procedure is scheduled on  09/14/2015   Report to [facility] at  51  A.M.  Call this number if you have problems the morning of surgery:  [phone]   Remember:  Do not eat food or drink liquids after midnight.  Take these medicines the morning of surgery with A SIP OF WATER  Xanax, amlodipine, zyrtec, neurontin, hydrocodone, lisinopril, mobic, robaxin, zofran, protonix, phenergan, effexor. Take your inhalers before you come and bring them with you. Take 1/2 of your Lantus the night before your procedure. DO NOT take any medicines for your diabetes the morning of surgery.    Do not wear jewelry, make-up or nail polish.  Do not wear lotions, powders, or perfumes.  You may wear deodorant.  Do not shave 48 hours prior to surgery.  Men may shave face and neck.  Do not bring valuables to the hospital.  [facility] is not responsible for any belongings or valuables.  Contacts, dentures or bridgework may not be worn into surgery.  Leave your suitcase in the car.  After surgery it may be brought to your room.  For patients admitted to the hospital, discharge time will be determined by your treatment team.  Patients discharged the day of surgery will not be allowed to drive home.   Name and phone number of your driver:   family Special instructions:  None  Please read over the following fact sheets that you were given. Pain Booklet, Coughing and Deep Breathing, Surgical Site Infection Prevention, Anesthesia Post-op Instructions and Care and Recovery After Surgery      Trigger Finger Trigger finger (digital tendinitis and stenosing tenosynovitis) is a common disorder that causes an often painful catching of the fingers or thumb. It occurs as a clicking, snapping, or locking of a finger in the palm of the hand. This is caused by a problem with the tendons that flex or bend the fingers sliding smoothly through  their sheaths. The condition may occur in any finger or a couple fingers at the same time.  The finger may lock with the finger curled or suddenly straighten out with a snap. This is more common in patients with rheumatoid arthritis and diabetes. Left untreated, the condition may get worse to the point where the finger becomes locked in flexion, like making a fist, or less commonly locked with the finger straightened out. CAUSES   Inflammation and scarring that lead to swelling around the tendon sheath.  Repeated or forceful movements.  Rheumatoid arthritis, an autoimmune disease that affects joints.  Gout.  Diabetes mellitus. SIGNS AND SYMPTOMS  Soreness and swelling of your finger.  A painful clicking or snapping as you bend and straighten your finger. DIAGNOSIS  Your health care provider will do a physical exam of your finger to diagnose trigger finger. TREATMENT   Splinting for 6-8 weeks may be helpful.  Nonsteroidal anti-inflammatory medicines (NSAIDs) can help to relieve the pain and inflammation.  Cortisone injections, along with splinting, may speed up recovery. Several injections may be required. Cortisone may give relief after one injection.  Surgery is another treatment that may be used if conservative treatments do not work. Surgery can be minor, without incisions (a cut does not have to be made), and can be done with a needle through the skin.  Other surgical choices involve an open procedure in which the surgeon opens the  hand through a small incision and cuts the pulley so the tendon can again slide smoothly. Your hand will still work fine. HOME CARE INSTRUCTIONS  Apply ice to the injured area, twice per day:  Put ice in a plastic bag.  Place a towel between your skin and the bag.  Leave the ice on for 20 minutes, 3-4 times a day.  Rest your hand often. MAKE SURE YOU:   Understand these instructions.  Will watch your condition.  Will get help right away  if you are not doing well or get worse.   This information is not intended to replace advice given to you by your health care provider. Make sure you discuss any questions you have with your health care provider.   Document Released: 08/25/2004 Document Revised: 07/08/2013 Document Reviewed: 04/07/2013 Elsevier Interactive Patient Education 2016 [place] Tunnel Release Carpal tunnel release is a surgical procedure to relieve numbness and pain in your hand that are caused by carpal tunnel syndrome. Your carpal tunnel is a narrow, hollow space in your wrist. It passes between your wrist bones and a band of connective tissue (transverse carpal ligament). The nerve that supplies most of your hand (median nerve) passes through this space, and so do the connections between your fingers and the muscles of your arm (tendons). Carpal tunnel syndrome makes this space swell and become narrow, and this causes pain and numbness. In carpal tunnel release surgery, a surgeon cuts through the transverse carpal ligament to make more room in the carpal tunnel space. You may have this surgery if other types of treatment have not worked. LET [facility] CARE PROVIDER KNOW ABOUT:  Any allergies you have.  All medicines you are taking, including vitamins, herbs, eye drops, creams, and over-the-counter medicines.  Previous problems you or members of your family have had with the use of anesthetics.  Any blood disorders you have.  Previous surgeries you have had.  Medical conditions you have. RISKS AND COMPLICATIONS Generally, this is a safe procedure. However, problems may occur, including:  Bleeding.  Infection.  Injury to the median nerve.  Need for additional surgery. BEFORE THE PROCEDURE  Ask your health care provider about:  Changing or stopping your regular medicines. This is especially important if you are taking diabetes medicines or blood thinners.  Taking medicines such as  aspirin and ibuprofen. These medicines can thin your blood. Do not take these medicines before your procedure if your health care provider instructs you not to.  Do not eat or drink anything after midnight on the night before the procedure or as directed by your health care provider.  Plan to have someone take you home after the procedure. PROCEDURE  An IV tube may be inserted into a vein.  You will be given one of the following:  A medicine that numbs the wrist area (local anesthetic). You may also be given a medicine to make you relax (sedative).  A medicine that makes you go to sleep (general anesthetic).  Your arm, hand, and wrist will be cleaned with a germ-killing solution (antiseptic).  Your surgeon will make a surgical cut (incision) over the palm side of your wrist. The surgeon will pull aside the skin of your wrist to expose the carpal tunnel space.  The surgeon will cut the transverse carpal ligament.  The edges of the incision will be closed with stitches (sutures) or staples.  A bandage (dressing) will be placed over your wrist and wrapped around your hand  and wrist. AFTER THE PROCEDURE  You may spend some time in a recovery area.  Your blood pressure, heart rate, breathing rate, and blood oxygen level will be monitored often until the medicines you were given have worn off.  You will likely have some pain. You will be given pain medicine.  You may need to wear a splint or a wrist brace over your dressing.   This information is not intended to replace advice given to you by your health care provider. Make sure you discuss any questions you have with your health care provider.   Document Released: 01/26/2004 Document Revised: 11/26/2014 Document Reviewed: 06/23/2014 Elsevier Interactive Patient Education 2016 [employer] PATIENT INSTRUCTIONS POST-ANESTHESIA  IMMEDIATELY FOLLOWING SURGERY:  Do not drive or operate machinery for the first twenty four hours after  surgery.  Do not make any important decisions for twenty four hours after surgery or while taking narcotic pain medications or sedatives.  If you develop intractable nausea and vomiting or a severe headache please notify your doctor immediately.  FOLLOW-UP:  Please make an appointment with your surgeon as instructed. You do not need to follow up with anesthesia unless specifically instructed to do so.  WOUND CARE INSTRUCTIONS (if applicable):  Keep a dry clean dressing on the anesthesia/puncture wound site if there is drainage.  Once the wound has quit draining you may leave it open to air.  Generally you should leave the bandage intact for twenty four hours unless there is drainage.  If the epidural site drains for more than 36-48 hours please call the anesthesia department.  QUESTIONS?:  Please feel free to call your physician or the hospital operator if you have any questions, and they will be happy to assist you.

## 2015-09-07 NOTE — Telephone Encounter (Signed)
Regarding out-patient surgery scheduled at [facility] October 03, 2015, CPT codes [phone] and [id number], contacted insurer [employer]; per Chauncey Reading, no pre-authorization required.  Reference # [id number].

## 2015-09-08 ENCOUNTER — Encounter (HOSPITAL_COMMUNITY)
Admission: RE | Admit: 2015-09-08 | Discharge: 2015-09-08 | Disposition: A | Discharge status: Home / Self Care | Payer: 59 | Source: Ambulatory Visit | Attending: Orthopedic Surgery | Admitting: Orthopedic Surgery

## 2015-09-08 ENCOUNTER — Encounter (HOSPITAL_COMMUNITY): Payer: Self-pay

## 2015-09-08 ENCOUNTER — Telehealth: Payer: Self-pay | Admitting: Orthopedic Surgery

## 2015-09-08 DIAGNOSIS — M65331 Trigger finger, right middle finger: Secondary | ICD-10-CM | POA: Insufficient documentation

## 2015-09-08 DIAGNOSIS — G5601 Carpal tunnel syndrome, right upper limb: Secondary | ICD-10-CM | POA: Insufficient documentation

## 2015-09-08 DIAGNOSIS — Z01818 Encounter for other preprocedural examination: Secondary | ICD-10-CM | POA: Diagnosis not present

## 2015-09-08 HISTORY — DX: Personal history of urinary calculi: Z87.442

## 2015-09-08 LAB — CBC WITH DIFFERENTIAL/PLATELET
BASOS ABS: 0.1 10*3/uL (ref 0.0–0.1)
Basophils Relative: 1 %
Eosinophils Absolute: 0.3 10*3/uL (ref 0.0–0.7)
Eosinophils Relative: 3 %
HCT: 43.9 % (ref 36.0–46.0)
Hemoglobin: 14.7 g/dL (ref 12.0–15.0)
LYMPHS ABS: 3.5 10*3/uL (ref 0.7–4.0)
Lymphocytes Relative: 33 %
MCH: 29.2 pg (ref 26.0–34.0)
MCHC: 33.5 g/dL (ref 30.0–36.0)
MCV: 87.1 fL (ref 78.0–100.0)
Monocytes Absolute: 0.6 10*3/uL (ref 0.1–1.0)
Monocytes Relative: 6 %
NEUTROS ABS: 6.3 10*3/uL (ref 1.7–7.7)
Neutrophils Relative %: 57 %
Platelets: 262 10*3/uL (ref 150–400)
RBC: 5.04 MIL/uL (ref 3.87–5.11)
RDW: 12.9 % (ref 11.5–15.5)
WBC: 10.7 10*3/uL — ABNORMAL HIGH (ref 4.0–10.5)

## 2015-09-08 LAB — BASIC METABOLIC PANEL
ANION GAP: 9 (ref 5–15)
BUN: 19 mg/dL (ref 6–20)
CHLORIDE: 104 mmol/L (ref 101–111)
CO2: 23 mmol/L (ref 22–32)
Calcium: 9.6 mg/dL (ref 8.9–10.3)
Creatinine, Ser: 0.85 mg/dL (ref 0.44–1.00)
GFR calc Af Amer: >60 mL/min (ref >60)
GLUCOSE: 238 mg/dL — AB (ref 65–99)
Potassium: 4.9 mmol/L (ref 3.5–5.1)
Sodium: 136 mmol/L (ref 135–145)

## 2015-09-08 NOTE — Telephone Encounter (Signed)
Call received from Jamie Harding at [facility] day surgery, ph# [phone], indicating that pre-op was done as scheduled today, 09/08/15, and that patient relayed to her that she wants to have "the right carpal tunnel and the right long trigger finger release only" on 09/14/15, rather than having bilateral carpal tunnel. *  * Patient stopped by our office shortly after receiving this call, and confirmed that this is what she would like to do.  Please advise patient.  Ph# [phone]

## 2015-09-08 NOTE — Pre-Procedure Instructions (Signed)
Patient in for PAT. Was scheduled for bilateral carpal tunnel release with right long trigger finger release but patient has decided that she just wants to have the right carpal tunnel and the right long trigger finger release and do the left carpal tunnel at a later date. Called Dr Ruthe Mannan office to talk to [facility] or Dr Aline Brochure but both were unavailable and Arbie Cookey left them a message concerning the above. She will have one of them call us back.

## 2015-09-08 NOTE — Telephone Encounter (Signed)
Nothing to advise, ok to only do right side, short stay aware, Dr Aline Brochure aware

## 2015-09-09 NOTE — Telephone Encounter (Signed)
Spoke with patient; assured that Dr Aline Brochure and [facility] hospital are aware.

## 2015-09-12 ENCOUNTER — Encounter: Payer: Self-pay | Admitting: "Endocrinology

## 2015-09-12 ENCOUNTER — Telehealth: Payer: Self-pay

## 2015-09-12 ENCOUNTER — Ambulatory Visit (INDEPENDENT_AMBULATORY_CARE_PROVIDER_SITE_OTHER): Payer: 59 | Admitting: "Endocrinology

## 2015-09-12 VITALS — BP 124/83 | HR 70 | Ht 62.0 in | Wt 232.0 lb

## 2015-09-12 DIAGNOSIS — E1159 Type 2 diabetes mellitus with other circulatory complications: Secondary | ICD-10-CM | POA: Diagnosis not present

## 2015-09-12 DIAGNOSIS — E785 Hyperlipidemia, unspecified: Secondary | ICD-10-CM | POA: Diagnosis not present

## 2015-09-12 DIAGNOSIS — I10 Essential (primary) hypertension: Secondary | ICD-10-CM | POA: Diagnosis not present

## 2015-09-12 MED ORDER — INSULIN GLARGINE 100 UNIT/ML SOLOSTAR PEN: 50.0000 [IU] | PEN_INJECTOR | Freq: Every day | SUBCUTANEOUS | Status: DC

## 2015-09-12 MED ORDER — NOVOLOG FLEXPEN 100 UNIT/ML ~~LOC~~ SOPN: 8.0000 [IU] | PEN_INJECTOR | Freq: Three times a day (TID) | SUBCUTANEOUS | Status: DC

## 2015-09-12 NOTE — Progress Notes (Addendum)
Subjective:    Patient ID: Jamie Harding, female    DOB: [birth year],    Past Medical History  Diagnosis Date  . DM (diabetes mellitus) ([place])   . HTN (hypertension)   . Obese   . Depression   . Anxiety   . HLD (hyperlipidemia)   . Hiatal hernia   . Asthma   . GERD (gastroesophageal reflux disease)   . Fatty liver   . Gastritis   . Pancreatitis   . Tibialis tendinitis   . Chest pain     Nuclear  February, 2012,,  breast attenuation, no definite scar or ischemia  . Gastroparesis     NEGATIVE GES 2010  . Rheumatoid arthritis(714.0)   . Stroke [facility]) 2008    no deficits  . Neuropathy ([place])   . Ejection fraction     EF 60%, echo, February, 2012, mild LVH  . Family history of anesthesia complication     post anesthesia N/V  . History of kidney stones    Past Surgical History  Procedure Laterality Date  . Cesarean section    . Cholecystectomy    . Ankle surgery      remove extra bone-left  . Finger surgery Left     left little finger-otif of finger  . Lithotripsy    . Dilation and curettage of uterus      multiple  . Laser ablation of the cervix    . Carpal tunnel release      right side  . Tubal ligation    . Gastric emptying  07/27/2009    The amount of activity in the stomach at 120 minutes was 13% which is in the normal range  . Esophagogastroduodenoscopy  07/29/2009    Mild gastritis, benign path  . Colonoscopy with propofol  09/16/2012    TWS:FKCL sessile polyps ranging between 3-23mm in size were found in the sigmoid colon and rectum; polypectomy was performed/Mild diverticulosis was noted throughout the entire examined colon/Small internal hemorrhoids  . Esophagogastroduodenoscopy (egd) with propofol  09/16/2012    SLF:Non-erosive gastritis (inflammation) was found; multiple bx/The duodenal mucosa showed no abnormalities in the ampulla and bulb and second portion of the duodenum/NAUSEA/VOMITING MOST LIKELY DUE TO GERD/GASTRITIS  . Savory dilation   09/16/2012    Procedure: SAVORY DILATION;  Surgeon: Danie Binder, MD;  Location: [facility];  Service: Endoscopy;  Laterality: N/A;  16 fr dilation  . Polypectomy  09/16/2012    Procedure: POLYPECTOMY;  Surgeon: Danie Binder, MD;  Location: [facility];  Service: Endoscopy;  Laterality: N/A;  . Carpal tunnel release Left 02/06/2013    Procedure: CARPAL TUNNEL RELEASE ;  Surgeon: Carole Civil, MD;  Location: [facility];  Service: Orthopedics;  Laterality: Left;  procedure end 1135  . Trigger finger release Left 02/06/2013    Procedure: RELEASE TRIGGER FINGER LEFT LONG FINGER/A-1 PULLEY;  Surgeon: Carole Civil, MD;  Location: [facility];  Service: Orthopedics;  Laterality: Left;  procedure began 1136  . Left and right heart catheterization with coronary angiogram N/A 01/03/2015    Procedure: LEFT AND RIGHT HEART CATHETERIZATION WITH CORONARY ANGIOGRAM;  Surgeon: Blane Ohara, MD;  Location: [facility] CATH LAB;  Service: Cardiovascular;  Laterality: N/A;   Social History   Social History  . Marital Status: Married    Spouse Name: N/A  . Number of Children: N/A  . Years of Education: N/A   Occupational History  . 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)  .     Social History Main Topics  . Smoking status: Former Smoker -- 0.50 packs/day for 3 years    Types: Cigarettes    Quit date: 09/11/1987  . Smokeless tobacco: None  . Alcohol Use: No  . Drug Use: No  . Sexual Activity: Yes    Birth Control/ Protection: Surgical   Other Topics Concern  . None   Social History Narrative   Outpatient Encounter Prescriptions as of 09/12/2015  Medication Sig  . ADVAIR DISKUS 250-50 MCG/DOSE AEPB INHALE 1 PUFF BY MOUTH 2 TIMES DAILY.  Marland Kitchen ALPRAZolam (XANAX) 0.5 MG tablet TAKE 1 TABLET BY MOUTH TWICE DAILY AS NEEDED FOR ANXIETY  . amLODipine (NORVASC) 10 MG tablet Take 1 tablet (10 mg total) by mouth daily.  Marland Kitchen aspirin EC 81 MG tablet Take 81 mg by mouth daily.  . cetirizine (ZYRTEC) 10 MG  tablet Take 1 tablet (10 mg total) by mouth daily.  . cyanocobalamin 1000 MCG tablet Take 1,000 mcg by mouth daily.   . Ergocalciferol (VITAMIN D2) 2000 UNITS TABS Take by mouth daily.  . fluticasone (FLONASE) 50 MCG/ACT nasal spray Place 2 sprays into both nostrils daily.  . furosemide (LASIX) 20 MG tablet Take 1 tablet (20 mg total) by mouth daily.  Marland Kitchen gabapentin (NEURONTIN) 300 MG capsule one tablet q am, one tablet q afternoon and 2 tabs q evening  . glucose blood test strip Use as instructed  . HYDROcodone-acetaminophen (NORCO/VICODIN) 5-325 MG per tablet Take 1 tablet by mouth every 6 (six) hours as needed. (Patient taking differently: Take 1 tablet by mouth every 6 (six) hours as needed for moderate pain. )  . Insulin Glargine (LANTUS SOLOSTAR) 100 UNIT/ML Solostar Pen Inject 50 Units into the skin daily at 10 pm. INJECT 100 UNITS EVERY NIGHT AT BEDTIME  . lisinopril (PRINIVIL,ZESTRIL) 20 MG tablet Take 1 tablet (20 mg total) by mouth daily.  . meloxicam (MOBIC) 15 MG tablet Take 1 tablet (15 mg total) by mouth daily.  . metFORMIN (GLUCOPHAGE) 1000 MG tablet Take 1 tablet (1,000 mg total) by mouth 2 (two) times daily.  . methocarbamol (ROBAXIN) 500 MG tablet Take 1 tablet (500 mg total) by mouth 2 (two) times daily as needed for muscle spasms.  . Multiple Vitamin (MULTIVITAMIN WITH MINERALS) TABS Take 1 tablet by mouth daily.  Marland Kitchen NOVOLOG FLEXPEN 100 UNIT/ML FlexPen Inject 8 Units into the skin 3 (three) times daily with meals.  . ondansetron (ZOFRAN) 8 MG tablet TAKE 1 TABLET BY MOUTH EVERY 12 HOURS AS NEEDED FOR NAUSEA  . pantoprazole (PROTONIX) 40 MG tablet Take 1 tablet (40 mg total) by mouth 2 (two) times daily.  . promethazine (PHENERGAN) 25 MG tablet TAKE 1 TABLET BY MOUTH EVERY 8 HOURS AS NEEDED FOR NAUSEA.  Marland Kitchen pyridOXINE (VITAMIN B-6) 100 MG tablet Take 100 mg by mouth daily.  . rosuvastatin (CRESTOR) 10 MG tablet Take 1 tablet (10 mg total) by mouth daily.  Marland Kitchen UNIFINE PENTIPS 31G X 8  MM MISC USE AS DIRECTED THREE TIMES A DAY AS NEEDED  . venlafaxine (EFFEXOR) 75 MG tablet Take 1 tablet (75 mg total) by mouth 2 (two) times daily with a meal. (Patient taking differently: Take 75-150 mg by mouth 2 (two) times daily with a meal. 2 tablets in the Am and 1 tablet in the PM)  . VENTOLIN HFA 108 (90 BASE) MCG/ACT inhaler INHALE 2 PUFFS INTO THE LUNGS EVERY 4 (FOUR) HOURS AS NEEDED  FOR WHEEZING.  . [DISCONTINUED] Insulin Glargine (LANTUS SOLOSTAR) 100 UNIT/ML Solostar Pen INJECT 100 UNITS EVERY NIGHT AT BEDTIME (Patient taking differently: Inject 60 Units into the skin at bedtime. INJECT 100 UNITS EVERY NIGHT AT BEDTIME)  . [DISCONTINUED] NOVOLOG FLEXPEN 100 UNIT/ML FlexPen USE THREE TIMES A DAY UNDER THE SKIN PER SLIDING SCALE (UP TO 15 UNITS AT A TIME  . [DISCONTINUED] sitaGLIPtin (JANUVIA) 100 MG tablet Take 1 tablet (100 mg total) by mouth daily.   No facility-administered encounter medications on file as of 09/12/2015.   ALLERGIES: Allergies  Allergen Reactions  . Byetta 10 Mcg Pen [Exenatide] Diarrhea and Nausea And Vomiting      touch of pancreatis  . Naproxen Nausea And Vomiting  . Augmentin [Amoxicillin-Pot Clavulanate]     diarrhea  . Azithromycin Nausea And Vomiting and Rash  . Erythromycin Hives       . Invokana [Canagliflozin]     Urinary issues  . Morphine Hives and Rash  . Sulfonamide Derivatives Nausea And Vomiting and Rash   VACCINATION STATUS: Immunization History  Administered Date(s) Administered  . Influenza-Unspecified 07/20/2014  . Pneumococcal-Unspecified 08/26/2007  . Td 03/31/2007    Diabetes She presents for her follow-up diabetic visit. She has type 2 diabetes mellitus. Onset time: She was diagnosed at approximate age of 77 years. Her disease course has been improving. There are no hypoglycemic associated symptoms. Pertinent negatives for hypoglycemia include no confusion, headaches, pallor or seizures. Pertinent negatives for diabetes include  no chest pain, no fatigue, no polydipsia, no polyphagia, no polyuria and no visual change. There are no hypoglycemic complications. Symptoms are improving. Diabetic complications include a CVA and retinopathy. Risk factors for coronary artery disease include family history, dyslipidemia, diabetes mellitus, obesity, tobacco exposure and sedentary lifestyle. Current diabetic treatment includes intensive insulin program and oral agent (dual therapy). Her weight is fluctuating minimally (She has lost 4 pounds since last visit.). She has not had a previous visit with a dietitian. She rarely participates in exercise. Her home blood glucose trend is decreasing steadily (She did not bring any logs nor meter.). Her overall blood glucose range is 140-180 mg/dl. An ACE inhibitor/angiotensin II receptor blocker is being taken. Eye exam is current (She has reported history of retinopathy.).  Hyperlipidemia This is a chronic problem. The current episode started more than 1 year ago. Exacerbating diseases include diabetes and obesity. Pertinent negatives include no chest pain, myalgias or shortness of breath. Current antihyperlipidemic treatment includes statins. Risk factors for coronary artery disease include dyslipidemia, diabetes mellitus, hypertension and obesity.  Hypertension This is a chronic problem. The current episode started more than 1 year ago. Pertinent negatives include no chest pain, headaches, palpitations or shortness of breath. Risk factors for coronary artery disease include dyslipidemia, diabetes mellitus, obesity, smoking/tobacco exposure and sedentary lifestyle. Past treatments include ACE inhibitors. Hypertensive end-organ damage includes CVA and retinopathy.      Review of Systems  Constitutional: Negative for fatigue and unexpected weight change.  HENT: Negative for trouble swallowing and voice change.   Eyes: Negative for visual disturbance.  Respiratory: Negative for cough, shortness of  breath and wheezing.   Cardiovascular: Negative for chest pain, palpitations and leg swelling.  Gastrointestinal: Negative for nausea, vomiting and diarrhea.  Endocrine: Negative for cold intolerance, heat intolerance, polydipsia, polyphagia and polyuria.  Musculoskeletal: Negative for myalgias and arthralgias.  Skin: Negative for color change, pallor, rash and wound.  Neurological: Negative for seizures and headaches.  Psychiatric/Behavioral: Negative for suicidal ideas and  confusion.    Objective:    BP 124/83 mmHg  Pulse 70  Ht 5\' 2"  (1.575 m)  Wt 232 lb (105.235 kg)  BMI 42.42 kg/m2  SpO2 98%  Wt Readings from Last 3 Encounters:  09/12/15 232 lb (105.235 kg)  09/08/15 235 lb (106.595 kg)  09/06/15 235 lb (106.595 kg)    Physical Exam  Constitutional: She is oriented to person, place, and time. She appears well-developed.  HENT:  Head: Normocephalic and atraumatic.  Eyes: EOM are normal.  Neck: Normal range of motion. Neck supple. No tracheal deviation present. No thyromegaly present.  Cardiovascular: Normal rate and regular rhythm.   Pulmonary/Chest: Effort normal and breath sounds normal.  Abdominal: Soft. Bowel sounds are normal. There is no tenderness. There is no guarding.  Musculoskeletal: Normal range of motion. She exhibits no edema.  Neurological: She is alert and oriented to person, place, and time. She has normal reflexes. No cranial nerve deficit. Coordination normal.  Skin: Skin is warm and dry. No rash noted. No erythema. No pallor.  Psychiatric: She has a normal mood and affect. Judgment normal.    Results for orders placed or performed during the hospital encounter of 56/81/27  Basic metabolic panel  Result Value Ref Range   Sodium 136 135 - 145 mmol/L   Potassium 4.9 3.5 - 5.1 mmol/L   Chloride 104 101 - 111 mmol/L   CO2 23 22 - 32 mmol/L   Glucose, Bld 238 (H) 65 - 99 mg/dL   BUN 19 6 - 20 mg/dL   Creatinine, Ser 0.85 0.44 - 1.00 mg/dL   Calcium  9.6 8.9 - 10.3 mg/dL   GFR calc non Af Amer >60 >60 mL/min   GFR calc Af Amer >60 >60 mL/min   Anion gap 9 5 - 15  CBC with Differential/Platelet  Result Value Ref Range   WBC 10.7 (H) 4.0 - 10.5 K/uL   RBC 5.04 3.87 - 5.11 MIL/uL   Hemoglobin 14.7 12.0 - 15.0 g/dL   HCT 43.9 36.0 - 46.0 %   MCV 87.1 78.0 - 100.0 fL   MCH 29.2 26.0 - 34.0 pg   MCHC 33.5 30.0 - 36.0 g/dL   RDW 12.9 11.5 - 15.5 %   Platelets 262 150 - 400 K/uL   Neutrophils Relative % 57 %   Neutro Abs 6.3 1.7 - 7.7 K/uL   Lymphocytes Relative 33 %   Lymphs Abs 3.5 0.7 - 4.0 K/uL   Monocytes Relative 6 %   Monocytes Absolute 0.6 0.1 - 1.0 K/uL   Eosinophils Relative 3 %   Eosinophils Absolute 0.3 0.0 - 0.7 K/uL   Basophils Relative 1 %   Basophils Absolute 0.1 0.0 - 0.1 K/uL   Complete Blood Count (Most recent): Lab Results  Component Value Date   WBC 10.7* 09/08/2015   HGB 14.7 09/08/2015   HCT 43.9 09/08/2015   MCV 87.1 09/08/2015   PLT 262 09/08/2015   Chemistry (most recent): Lab Results  Component Value Date   NA 136 09/08/2015   K 4.9 09/08/2015   CL 104 09/08/2015   CO2 23 09/08/2015   BUN 19 09/08/2015   CREATININE 0.85 09/08/2015   Diabetic Labs (most recent): Lab Results  Component Value Date   HGBA1C 9.4* 06/30/2015   HGBA1C 10.5 03/30/2015   HGBA1C 9.3* 12/09/2014   Lipid profile (most recent): Lab Results  Component Value Date   TRIG 115 06/30/2015   CHOL 175 06/30/2015  Assessment & Plan:   1. Type 2 diabetes mellitus with vascular disease ([place]) Her diabetes is complicated by CVA. -She came with much better glucose profile. Her average blood glucose is between 140 and 180 mg/dL . Marland Kitchen Recent labs are reviewed. -Patient remains at a high risk for more acute and chronic complications of diabetes which include CAD, CVA, CKD, retinopathy, and neuropathy. These are all discussed in detail with the patient.  - I have counseled the patient on diet management and weight  loss, by adopting a carbohydrate restricted/protein rich diet.  - Patient is advised to stick to a routine mealtimes to eat 3 meals  a day and avoid unnecessary snacks ( to snack only to correct hypoglycemia).   - Suggestion is made for patient to avoid simple carbohydrates   from their diet including Cakes , Desserts, Ice Cream,  Soda (  diet and regular) , Sweet Tea , Candies,  Chips, Cookies, Artificial Sweeteners,   and "Sugar-free" Products . This will help patient to have stable blood glucose profile and potentially avoid unintended weight gain.  - I encouraged the patient to switch to  unprocessed or minimally processed complex starch and increased protein intake (animal or plant source), fruits, and vegetables.  -Her visit with Jearld Fenton, CDE is pending.   - I have approached patient with the following individualized plan to manage diabetes and patient agrees:   -She may require the high-dose insulin she is currently on however since she did not bring any meter nor log book 80s unsafe to continue the high-dose insulin without her commitment for adequate monitoring.  - I  will proceed to readjust her basal insulin Lantus to 50 units  QHS, and prandial insulin NovoLog 8 units  TIDAC for pre-meal BG readings of 90-150mg /dl, plus patient specific correction dose for unexpected hyperglycemia above 150mg /dl, associated with strict monitoring of glucose  AC and HS. - Patient is warned not to take insulin without proper monitoring per orders. -Adjustment parameters are given for hypo and hyperglycemia in writing. -Patient is encouraged to call clinic for blood glucose levels less than 70 or above 300 mg /dl. - I will continue metformin 1000 mg by mouth twice a day and Januvia 100 mg by mouth every a.m., therapeutically suitable for patient.. -He reports severe side effects from Invokana and Byetta.   - Patient specific target  A1c;  LDL, HDL, Triglycerides, and  Waist Circumference were  discussed in detail.  2) BP/HTN: Controlled. Continue current medications including ACEI. 3) Lipids/HPL: continue statins. 4)  Weight/Diet: CDE consult in progress, exercise, and carbohydrates information provided.  5) Chronic Care/Health Maintenance:  -Patient is on ACEI and Statin medications and encouraged to continue to follow up with Ophthalmology, Podiatrist at least yearly or according to recommendations, and advised to   stay away from smoking. I have recommended yearly flu vaccine and pneumonia vaccination at least every 5 years; moderate intensity exercise for up to 150 minutes weekly; and  sleep for at least 7 hours a day.   Patient to bring meter and  blood glucose logs during their next visit.    I advised patient to maintain close follow up with their PCP for primary care needs. Follow up plan: Return in about 10 weeks (around 11/21/2015) for diabetes, high blood pressure, high cholesterol, follow up with pre-visit labs, meter, and logs.  Glade Lloyd, MD Phone: [phone]  Fax: [phone]   09/12/2015, 10:28 AM   Addendum: I sent a new prescription for  Lantus 50 units daily at bedtime to [facility] outpatient pharmacy.

## 2015-09-12 NOTE — Telephone Encounter (Signed)
A new prescription needs to be sent to the [facility]. They voided the prescription sent today. It read both take 50 units qhs & 100 units qhs?

## 2015-09-12 NOTE — Addendum Note (Signed)
Addended by: Cassandria Anger on: 09/12/2015 04:24 PM   Modules accepted: Orders, Medications

## 2015-09-12 NOTE — Patient Instructions (Signed)

## 2015-09-13 NOTE — H&P (Signed)
Chief Complaint   Patient presents with   .  Follow-up        numbness and tingling of the right hand with triggering of the right long finger     Outpatient history and physical for surgery  Jamie Harding is a 59 y.o. female.   HPI 59 year old female presents with a history of bilateral carpal tunnel release ongoing problems with trigger finger status post trigger release on the left and current right long finger triggering. She is a [employer] certified accountant she complains of the following: Bilateral cramping numbness and tingling both hands radiating up towards the elbow but starting in the wrist and hand associated with nocturnal symptoms and dropping things. Catching locking right long finger as well.  Review of systems currently no fever chills nausea vomiting diarrhea. Other systems are quiescent   Review of Systems See hpi    Past Medical History   Diagnosis  Date   .  DM (diabetes mellitus) ([place])     .  HTN (hypertension)     .  Obese     .  Depression     .  Anxiety     .  HLD (hyperlipidemia)     .  Hiatal hernia     .  Asthma     .  GERD (gastroesophageal reflux disease)     .  Fatty liver     .  Gastritis     .  Pancreatitis     .  Tibialis tendinitis     .  Chest pain         Nuclear  February, 2012,,  breast attenuation, no definite scar or ischemia   .  Gastroparesis         NEGATIVE GES 2010   .  Rheumatoid arthritis(714.0)     .  Stroke [facility])  2008       no deficits   .  Neuropathy ([place])     .  Ejection fraction         EF 60%, echo, February, 2012, mild LVH   .  Family history of anesthesia complication         post anesthesia N/V       Past Surgical History   Procedure  Laterality  Date   .  Cesarean section       .  Cholecystectomy       .  Ankle surgery           remove extra bone-left   .  Finger surgery           left little finger   .  Lithotripsy       .  Dilation and curettage of uterus           multiple   .  Laser ablation of the cervix       .   Carpal tunnel release           right side   .  Tubal ligation       .  Gastric emptying    07/27/2009       The amount of activity in the stomach at 120 minutes was 13% which is in the normal range   .  Esophagogastroduodenoscopy    07/29/2009       Mild gastritis, benign path   .  Colonoscopy with propofol    09/16/2012       PPJ:KDTO sessile polyps ranging between 3-39mm in size  were found in the sigmoid colon and rectum; polypectomy was performed/Mild diverticulosis was noted throughout the entire examined colon/Small internal hemorrhoids   .  Esophagogastroduodenoscopy (egd) with propofol    09/16/2012       SLF:Non-erosive gastritis (inflammation) was found; multiple bx/The duodenal mucosa showed no abnormalities in the ampulla and bulb and second portion of the duodenum/NAUSEA/VOMITING MOST LIKELY DUE TO GERD/GASTRITIS   .  Savory dilation    09/16/2012       Procedure: SAVORY DILATION;  Surgeon: Danie Binder, MD;  Location: [facility];  Service: Endoscopy;  Laterality: N/A;  16 fr dilation   .  Polypectomy    09/16/2012       Procedure: POLYPECTOMY;  Surgeon: Danie Binder, MD;  Location: [facility];  Service: Endoscopy;  Laterality: N/A;   .  Carpal tunnel release  Left  02/06/2013       Procedure: CARPAL TUNNEL RELEASE ;  Surgeon: Carole Civil, MD;  Location: [facility];  Service: Orthopedics;  Laterality: Left;  procedure end 1135   .  Trigger finger release  Left  02/06/2013       Procedure: RELEASE TRIGGER FINGER LEFT LONG FINGER/A-1 PULLEY;  Surgeon: Carole Civil, MD;  Location: [facility];  Service: Orthopedics;  Laterality: Left;  procedure began 1136   .  Left and right heart catheterization with coronary angiogram  N/A  01/03/2015       Procedure: LEFT AND RIGHT HEART CATHETERIZATION WITH CORONARY ANGIOGRAM;  Surgeon: Blane Ohara, MD;  Location: [facility] CATH LAB;  Service: Cardiovascular;  Laterality: N/A;       Family History   Problem  Relation  Age of Onset   .  Breast cancer   Mother     .  Diabetes  Father     .  Coronary artery disease       .  Arthritis       .  Asthma       .  Colon cancer  Neg Hx     .  Diabetes  Sister       Social History Social History   Substance Use Topics   .  Smoking status:  Former Smoker -- 0.50 packs/day for 3 years       Types:  Cigarettes       Quit date:  09/11/1987   .  Smokeless tobacco:  Not on file   .  Alcohol Use:  No       Allergies   Allergen  Reactions   .  Byetta 10 Mcg Pen [Exenatide]  Diarrhea and Nausea And Vomiting         touch of pancreatis   .  Naproxen  Nausea And Vomiting   .  Augmentin [Amoxicillin-Pot Clavulanate]         diarrhea   .  Azithromycin  Nausea And Vomiting and Rash   .  Erythromycin  Hives           .  Invokana [Canagliflozin]         Urinary issues   .  Morphine  Hives and Rash   .  Sulfonamide Derivatives  Nausea And Vomiting and Rash       Current Outpatient Prescriptions   Medication  Sig  Dispense  Refill   .  ADVAIR DISKUS 250-50 MCG/DOSE AEPB  INHALE 1 PUFF BY MOUTH 2 TIMES DAILY.  60 each  5   .  ALPRAZolam (XANAX) 0.5 MG  tablet  TAKE 1 TABLET BY MOUTH TWICE DAILY AS NEEDED FOR ANXIETY  60 tablet  5   .  amLODipine (NORVASC) 10 MG tablet  Take 1 tablet (10 mg total) by mouth daily.  90 tablet  3   .  aspirin EC 81 MG tablet  Take 81 mg by mouth daily.       .  cetirizine (ZYRTEC) 10 MG tablet  Take 1 tablet (10 mg total) by mouth daily.  30 tablet  6   .  cyanocobalamin 1000 MCG tablet  Take 1,000 mcg by mouth daily.        .  Ergocalciferol (VITAMIN D2) 2000 UNITS TABS  Take by mouth daily.       .  fluticasone (FLONASE) 50 MCG/ACT nasal spray  Place 2 sprays into both nostrils daily.  48 g  3   .  furosemide (LASIX) 20 MG tablet  Take 1 tablet (20 mg total) by mouth daily.  90 tablet  3   .  gabapentin (NEURONTIN) 300 MG capsule  one tablet q am, one tablet q afternoon and 2 tabs q evening  360 capsule  3   .  glucose blood test strip  Use as instructed  100 each   12   .  HYDROcodone-acetaminophen (NORCO/VICODIN) 5-325 MG per tablet  Take 1 tablet by mouth every 6 (six) hours as needed.  120 tablet  0   .  Insulin Glargine (LANTUS SOLOSTAR) 100 UNIT/ML Solostar Pen  INJECT 100 UNITS EVERY NIGHT AT BEDTIME  15 mL  0   .  lisinopril (PRINIVIL,ZESTRIL) 20 MG tablet  Take 1 tablet (20 mg total) by mouth daily.  90 tablet  3   .  meloxicam (MOBIC) 15 MG tablet  Take 1 tablet (15 mg total) by mouth daily.  90 tablet  0   .  metFORMIN (GLUCOPHAGE) 1000 MG tablet  Take 1 tablet (1,000 mg total) by mouth 2 (two) times daily.  180 tablet  3   .  methocarbamol (ROBAXIN) 500 MG tablet  Take 1 tablet (500 mg total) by mouth 2 (two) times daily as needed for muscle spasms.  60 tablet  5   .  Multiple Vitamin (MULTIVITAMIN WITH MINERALS) TABS  Take 1 tablet by mouth daily.       Marland Kitchen  NOVOLOG FLEXPEN 100 UNIT/ML FlexPen  USE THREE TIMES A DAY UNDER THE SKIN PER SLIDING SCALE (UP TO 15 UNITS AT A TIME  15 mL  5   .  ondansetron (ZOFRAN) 8 MG tablet  TAKE 1 TABLET BY MOUTH EVERY 12 HOURS AS NEEDED FOR NAUSEA  20 tablet  2   .  pantoprazole (PROTONIX) 40 MG tablet  Take 1 tablet (40 mg total) by mouth 2 (two) times daily.  180 tablet  3   .  promethazine (PHENERGAN) 25 MG tablet  TAKE 1 TABLET BY MOUTH EVERY 8 HOURS AS NEEDED FOR NAUSEA.  30 tablet  2   .  pyridOXINE (VITAMIN B-6) 100 MG tablet  Take 100 mg by mouth daily.       .  rosuvastatin (CRESTOR) 10 MG tablet  Take 1 tablet (10 mg total) by mouth daily.  90 tablet  3   .  sitaGLIPtin (JANUVIA) 100 MG tablet  Take 1 tablet (100 mg total) by mouth daily.  90 tablet  3   .  UNIFINE PENTIPS 31G X 8 MM MISC  USE AS DIRECTED THREE TIMES A DAY  AS NEEDED  300 each  5   .  venlafaxine (EFFEXOR) 75 MG tablet  Take 1 tablet (75 mg total) by mouth 2 (two) times daily with a meal.  180 tablet  3   .  VENTOLIN HFA 108 (90 BASE) MCG/ACT inhaler  INHALE 2 PUFFS INTO THE LUNGS EVERY 4 (FOUR) HOURS AS NEEDED FOR WHEEZING.  54 g  5       No current facility-administered medications for this visit.        Physical Exam Height 5\' 2"  (1.575 m), weight 235 lb (106.595 kg). Physical Exam The patient is well developed well nourished and well groomed. Orientation to person place and time is normal   Mood is pleasant. Ambulatory status is normal without a limp  Upper extremity examination Skin remains intact without laceration ulceration or erythema. Skin incisions over the right and left carpal tunnel healed well no neuropathy or neuromas. No tenderness or tingling with Tinel's sign. Left hand trigger release long finger.   Gross motor exam is intact without atrophy. Muscle tone normal grade 5 motor strength Neurovascular exam remains intact Inspection mouth swelling both hands all digits. Tenderness over the carpal tunnel positive compression test bilaterally. Slight decreased range of motion small joints of the hand and inability make tight fist. Joints are stable.  Right long finger tenderness over the A1 pulley  Epitrochlear lymph nodes normal bilaterally.  Lower extremity is not involved.   Data Reviewed  I have independently reviewed the radiographs and my interpretation is:  Previous notes and carpal tunnel surgery notes reviewed. Nothing to add.   Assessment  Right carpal tunnel syndrome and triggering of the right long finger  Plan open right carpal tunnel release and release of right long finger

## 2015-09-14 ENCOUNTER — Encounter (HOSPITAL_COMMUNITY)
Admission: RE | Disposition: A | Discharge status: Home / Self Care | Payer: Self-pay | Source: Ambulatory Visit | Attending: Orthopedic Surgery

## 2015-09-14 ENCOUNTER — Encounter (HOSPITAL_COMMUNITY): Payer: Self-pay | Admitting: *Deleted

## 2015-09-14 ENCOUNTER — Ambulatory Visit (HOSPITAL_COMMUNITY)
Admission: RE | Admit: 2015-09-14 | Discharge: 2015-09-14 | Disposition: A | Discharge status: Home / Self Care | Payer: 59 | Source: Ambulatory Visit | Attending: Orthopedic Surgery | Admitting: Orthopedic Surgery

## 2015-09-14 ENCOUNTER — Ambulatory Visit (HOSPITAL_COMMUNITY): Payer: 59 | Admitting: Anesthesiology

## 2015-09-14 DIAGNOSIS — J449 Chronic obstructive pulmonary disease, unspecified: Secondary | ICD-10-CM | POA: Diagnosis not present

## 2015-09-14 DIAGNOSIS — K760 Fatty (change of) liver, not elsewhere classified: Secondary | ICD-10-CM | POA: Diagnosis not present

## 2015-09-14 DIAGNOSIS — Z794 Long term (current) use of insulin: Secondary | ICD-10-CM | POA: Diagnosis not present

## 2015-09-14 DIAGNOSIS — E1140 Type 2 diabetes mellitus with diabetic neuropathy, unspecified: Secondary | ICD-10-CM | POA: Diagnosis not present

## 2015-09-14 DIAGNOSIS — Z79899 Other long term (current) drug therapy: Secondary | ICD-10-CM | POA: Insufficient documentation

## 2015-09-14 DIAGNOSIS — E1143 Type 2 diabetes mellitus with diabetic autonomic (poly)neuropathy: Secondary | ICD-10-CM | POA: Diagnosis not present

## 2015-09-14 DIAGNOSIS — F329 Major depressive disorder, single episode, unspecified: Secondary | ICD-10-CM | POA: Insufficient documentation

## 2015-09-14 DIAGNOSIS — M65331 Trigger finger, right middle finger: Secondary | ICD-10-CM | POA: Insufficient documentation

## 2015-09-14 DIAGNOSIS — K449 Diaphragmatic hernia without obstruction or gangrene: Secondary | ICD-10-CM | POA: Diagnosis not present

## 2015-09-14 DIAGNOSIS — G5601 Carpal tunnel syndrome, right upper limb: Secondary | ICD-10-CM | POA: Diagnosis not present

## 2015-09-14 DIAGNOSIS — E785 Hyperlipidemia, unspecified: Secondary | ICD-10-CM | POA: Insufficient documentation

## 2015-09-14 DIAGNOSIS — G709 Myoneural disorder, unspecified: Secondary | ICD-10-CM | POA: Insufficient documentation

## 2015-09-14 DIAGNOSIS — Z79891 Long term (current) use of opiate analgesic: Secondary | ICD-10-CM | POA: Insufficient documentation

## 2015-09-14 DIAGNOSIS — K219 Gastro-esophageal reflux disease without esophagitis: Secondary | ICD-10-CM | POA: Diagnosis not present

## 2015-09-14 DIAGNOSIS — Z7982 Long term (current) use of aspirin: Secondary | ICD-10-CM | POA: Insufficient documentation

## 2015-09-14 DIAGNOSIS — Z8673 Personal history of transient ischemic attack (TIA), and cerebral infarction without residual deficits: Secondary | ICD-10-CM | POA: Insufficient documentation

## 2015-09-14 DIAGNOSIS — M069 Rheumatoid arthritis, unspecified: Secondary | ICD-10-CM | POA: Insufficient documentation

## 2015-09-14 DIAGNOSIS — J45909 Unspecified asthma, uncomplicated: Secondary | ICD-10-CM | POA: Diagnosis not present

## 2015-09-14 DIAGNOSIS — Z7951 Long term (current) use of inhaled steroids: Secondary | ICD-10-CM | POA: Insufficient documentation

## 2015-09-14 DIAGNOSIS — F419 Anxiety disorder, unspecified: Secondary | ICD-10-CM | POA: Diagnosis not present

## 2015-09-14 DIAGNOSIS — K3184 Gastroparesis: Secondary | ICD-10-CM | POA: Diagnosis not present

## 2015-09-14 DIAGNOSIS — E669 Obesity, unspecified: Secondary | ICD-10-CM | POA: Insufficient documentation

## 2015-09-14 DIAGNOSIS — I10 Essential (primary) hypertension: Secondary | ICD-10-CM | POA: Diagnosis not present

## 2015-09-14 DIAGNOSIS — M65339 Trigger finger, unspecified middle finger: Secondary | ICD-10-CM | POA: Insufficient documentation

## 2015-09-14 DIAGNOSIS — Z791 Long term (current) use of non-steroidal anti-inflammatories (NSAID): Secondary | ICD-10-CM | POA: Insufficient documentation

## 2015-09-14 DIAGNOSIS — M1990 Unspecified osteoarthritis, unspecified site: Secondary | ICD-10-CM | POA: Diagnosis not present

## 2015-09-14 DIAGNOSIS — Z87891 Personal history of nicotine dependence: Secondary | ICD-10-CM | POA: Insufficient documentation

## 2015-09-14 DIAGNOSIS — R200 Anesthesia of skin: Secondary | ICD-10-CM | POA: Diagnosis present

## 2015-09-14 HISTORY — PX: CARPAL TUNNEL RELEASE: SHX101

## 2015-09-14 HISTORY — PX: TRIGGER FINGER RELEASE: SHX641

## 2015-09-14 LAB — GLUCOSE, CAPILLARY
GLUCOSE-CAPILLARY: 190 mg/dL — AB (ref 65–99)
GLUCOSE-CAPILLARY: 144 mg/dL — AB (ref 65–99)

## 2015-09-14 SURGERY — CARPAL TUNNEL RELEASE
Anesthesia: Regional | Site: Wrist | Laterality: Right

## 2015-09-14 MED ORDER — PROPOFOL 10 MG/ML IV BOLUS
INTRAVENOUS | Status: AC
  Filled 2015-09-14: qty 20

## 2015-09-14 MED ORDER — VANCOMYCIN HCL 10 G IV SOLR
1500.0000 mg | INTRAVENOUS | Status: AC
  Administered 2015-09-14 (×2): 1500 mg via INTRAVENOUS
  Filled 2015-09-14: qty 1500

## 2015-09-14 MED ORDER — LIDOCAINE HCL (PF) 0.5 % IJ SOLN
INTRAMUSCULAR | Status: DC | PRN
  Administered 2015-09-14: 250 mg via INTRAVENOUS

## 2015-09-14 MED ORDER — MIDAZOLAM HCL 2 MG/2ML IJ SOLN
INTRAMUSCULAR | Status: AC
  Filled 2015-09-14: qty 2

## 2015-09-14 MED ORDER — BUPIVACAINE HCL (PF) 0.5 % IJ SOLN
INTRAMUSCULAR | Status: AC
  Filled 2015-09-14: qty 30

## 2015-09-14 MED ORDER — SODIUM CHLORIDE 0.9 % IV SOLN
INTRAVENOUS | Status: DC | PRN
  Administered 2015-09-14: 10:00:00 via INTRAVENOUS

## 2015-09-14 MED ORDER — HYDROCODONE-ACETAMINOPHEN 5-325 MG PO TABS: 1.0000 | ORAL_TABLET | Freq: Four times a day (QID) | ORAL | Status: DC | PRN

## 2015-09-14 MED ORDER — FENTANYL CITRATE (PF) 100 MCG/2ML IJ SOLN
INTRAMUSCULAR | Status: DC | PRN
  Administered 2015-09-14: 50 ug via INTRAVENOUS

## 2015-09-14 MED ORDER — LIDOCAINE HCL (PF) 0.5 % IJ SOLN
INTRAMUSCULAR | Status: AC
  Filled 2015-09-14: qty 50

## 2015-09-14 MED ORDER — FENTANYL CITRATE (PF) 100 MCG/2ML IJ SOLN: 25.0000 ug | INTRAMUSCULAR | Status: DC | PRN

## 2015-09-14 MED ORDER — BUPIVACAINE HCL (PF) 0.5 % IJ SOLN
INTRAMUSCULAR | Status: DC | PRN
  Administered 2015-09-14: 10 mL

## 2015-09-14 MED ORDER — LACTATED RINGERS IV SOLN
INTRAVENOUS | Status: DC
  Administered 2015-09-14: 10:00:00 via INTRAVENOUS

## 2015-09-14 MED ORDER — 0.9 % SODIUM CHLORIDE (POUR BTL) OPTIME
TOPICAL | Status: DC | PRN
  Administered 2015-09-14: 1000 mL

## 2015-09-14 MED ORDER — CHLORHEXIDINE GLUCONATE 4 % EX LIQD: 60.0000 mL | Freq: Once | CUTANEOUS | Status: DC

## 2015-09-14 MED ORDER — FENTANYL CITRATE (PF) 100 MCG/2ML IJ SOLN
INTRAMUSCULAR | Status: AC
  Filled 2015-09-14: qty 4

## 2015-09-14 MED ORDER — MIDAZOLAM HCL 2 MG/2ML IJ SOLN
1.0000 mg | INTRAMUSCULAR | Status: DC | PRN
  Administered 2015-09-14: 2 mg via INTRAVENOUS

## 2015-09-14 MED ORDER — PROPOFOL 500 MG/50ML IV EMUL
INTRAVENOUS | Status: DC | PRN
  Administered 2015-09-14: 35 ug/kg/min via INTRAVENOUS

## 2015-09-14 MED ORDER — GLYCOPYRROLATE 0.2 MG/ML IJ SOLN
INTRAMUSCULAR | Status: AC
  Filled 2015-09-14: qty 1

## 2015-09-14 MED ORDER — ONDANSETRON HCL 4 MG/2ML IJ SOLN: 4.0000 mg | Freq: Once | INTRAMUSCULAR | Status: DC | PRN

## 2015-09-14 MED ORDER — SODIUM CHLORIDE 0.9 % IJ SOLN
INTRAMUSCULAR | Status: AC
  Filled 2015-09-14: qty 3

## 2015-09-14 MED ORDER — MIDAZOLAM HCL 5 MG/5ML IJ SOLN
INTRAMUSCULAR | Status: DC | PRN
  Administered 2015-09-14: 1 mg via INTRAVENOUS

## 2015-09-14 SURGICAL SUPPLY — 50 items
BAG HAMPER (MISCELLANEOUS) ×4 IMPLANT
BANDAGE ELASTIC 3 VELCRO NS (GAUZE/BANDAGES/DRESSINGS) ×4 IMPLANT
BANDAGE ESMARK 4X12 BL STRL LF (DISPOSABLE) ×2 IMPLANT
BLADE SURG 15 STRL LF DISP TIS (BLADE) ×2 IMPLANT
BLADE SURG 15 STRL SS (BLADE) ×4
BNDG CMPR 12X4 ELC STRL LF (DISPOSABLE) ×2
BNDG CMPR MD 5X2 ELC HKLP STRL (GAUZE/BANDAGES/DRESSINGS) ×2
BNDG COHESIVE 4X5 TAN STRL (GAUZE/BANDAGES/DRESSINGS) ×4 IMPLANT
BNDG CONFORM 2 STRL LF (GAUZE/BANDAGES/DRESSINGS) ×2 IMPLANT
BNDG ELASTIC 2 VLCR STRL LF (GAUZE/BANDAGES/DRESSINGS) ×4 IMPLANT
BNDG ESMARK 4X12 BLUE STRL LF (DISPOSABLE) ×4
BNDG GAUZE ELAST 4 BULKY (GAUZE/BANDAGES/DRESSINGS) ×2 IMPLANT
CHLORAPREP W/TINT 26ML (MISCELLANEOUS) ×4 IMPLANT
CLOTH BEACON ORANGE TIMEOUT ST (SAFETY) ×4 IMPLANT
COVER LIGHT HANDLE STERIS (MISCELLANEOUS) ×12 IMPLANT
CUFF TOURNIQUET SINGLE 18IN (TOURNIQUET CUFF) ×4 IMPLANT
CUFF TOURNIQUET SINGLE 24IN (TOURNIQUET CUFF) IMPLANT
DECANTER SPIKE VIAL GLASS SM (MISCELLANEOUS) ×4 IMPLANT
DRAPE PROXIMA HALF (DRAPES) ×4 IMPLANT
DRSG XEROFORM 1X8 (GAUZE/BANDAGES/DRESSINGS) ×2 IMPLANT
ELECT NDL TIP 2.8 STRL (NEEDLE) ×2 IMPLANT
ELECT NEEDLE TIP 2.8 STRL (NEEDLE) ×4 IMPLANT
ELECT REM PT RETURN 9FT ADLT (ELECTROSURGICAL) ×4
ELECTRODE REM PT RTRN 9FT ADLT (ELECTROSURGICAL) ×2 IMPLANT
GAUZE SPONGE 4X4 12PLY STRL (GAUZE/BANDAGES/DRESSINGS) IMPLANT
GLOVE BIOGEL M 7.0 STRL (GLOVE) ×4 IMPLANT
GLOVE BIOGEL PI IND STRL 7.0 (GLOVE) IMPLANT
GLOVE BIOGEL PI INDICATOR 7.0 (GLOVE) ×4
GLOVE EXAM NITRILE MD LF STRL (GLOVE) ×2 IMPLANT
GLOVE SKINSENSE NS SZ8.0 LF (GLOVE) ×2
GLOVE SKINSENSE STRL SZ8.0 LF (GLOVE) ×2 IMPLANT
GLOVE SS N UNI LF 8.5 STRL (GLOVE) ×4 IMPLANT
GOWN STRL REUS W/ TWL LRG LVL3 (GOWN DISPOSABLE) ×2 IMPLANT
GOWN STRL REUS W/TWL LRG LVL3 (GOWN DISPOSABLE) ×16 IMPLANT
GOWN STRL REUS W/TWL XL LVL3 (GOWN DISPOSABLE) ×4 IMPLANT
HAND ALUMI XLG (SOFTGOODS) ×4 IMPLANT
KIT ROOM TURNOVER APOR (KITS) ×4 IMPLANT
MANIFOLD NEPTUNE II (INSTRUMENTS) ×4 IMPLANT
NDL HYPO 21X1.5 SAFETY (NEEDLE) ×2 IMPLANT
NEEDLE HYPO 21X1.5 SAFETY (NEEDLE) ×4 IMPLANT
NS IRRIG 1000ML POUR BTL (IV SOLUTION) ×4 IMPLANT
PACK BASIC LIMB (CUSTOM PROCEDURE TRAY) ×4 IMPLANT
PAD ARMBOARD 7.5X6 YLW CONV (MISCELLANEOUS) ×4 IMPLANT
SET BASIN LINEN APH (SET/KITS/TRAYS/PACK) ×4 IMPLANT
SPONGE GAUZE 2X2 8PLY STER LF (GAUZE/BANDAGES/DRESSINGS)
SPONGE GAUZE 2X2 8PLY STRL LF (GAUZE/BANDAGES/DRESSINGS) IMPLANT
SPONGE GAUZE 4X4 12PLY (GAUZE/BANDAGES/DRESSINGS) ×2 IMPLANT
SUT ETHILON 3 0 FSL (SUTURE) ×4 IMPLANT
SYR CONTROL 10ML LL (SYRINGE) ×4 IMPLANT
SYRINGE 10CC LL (SYRINGE) IMPLANT

## 2015-09-14 NOTE — Op Note (Signed)
09/14/2015  76:80 AM  PATIENT:  Jamie Harding  60 y.o. female  PRE-OPERATIVE DIAGNOSIS:  right carpal tunnel syndrome, right long trigger finger  POST-OPERATIVE DIAGNOSIS:  right carpal tunnel syndrome, right long trigger finger  PROCEDURE:  Procedure(s): RIGHT CARPAL TUNNEL RELEASE (Right) RIGHT LONG FINGER TRIGGER RELEASE (Right)   Surgical findings. The median nerve was encased in the undersurface of the transverse carpal ligament with severe scarring and as far as the right long finger goes there was a bulbous contour of the flexor tendon with stenosis at the A1 pulley  Assisted by Simonne Maffucci  Anesthesia technique was a Bier block  Tourniquet time was approximately 30 minutes  Details of the procedure  The patient was identified in the preop area. The site was confirmed and the chart was reviewed. The patient was taken to the operating room for general anesthesia and she was given preoperative Ancef for surgical prophylaxis  After sterile prep and drape timeout was executed.  The incision was made over the previous carpal tunnel incision and that was used to open the wrist. Blunt dissection was used due to the prior surgery. We extended the dissection to the distal aspect of the transverse carpal ligament and used blunt dissection underneath it and then carefully divided the transverse carpal ligament. We had to release the nerve from the undersurface of the transverse carpal ligament. After irrigation the wound was closed with 3-0 nylon suture in interrupted fashion  We then turned our attention to the right long finger. He made a longitudinal incision over the A1 pulley divided the skin. We protected the neurovascular bundles on each side and then divided the A1 pulley. After flexion-extension revealed no further stenosis we irrigated the wound and closed with 3-0 nylon suture  Sterile dressings were applied and the tourniquet was released all fingers have good  color.  SURGEON:  Surgeon(s) and Role:    * Carole Civil, MD - Primary  PHYSICIAN ASSISTANT:    EBL:  Total I/O In: 200 [I.V.:200] Out: -   BLOOD ADMINISTERED:none  DRAINS: none   LOCAL MEDICATIONS USED:  MARCAINE     SPECIMEN:  No Specimen  DISPOSITION OF SPECIMEN:  N/A  COUNTS:  YES  TOURNIQUET:   Total Tourniquet Time Documented: Upper Arm (Right) - 36 minutes Total: Upper Arm (Right) - 36 minutes   DICTATION: .Viviann Spare Dictation  PLAN OF CARE: Discharge to home after PACU  PATIENT DISPOSITION:  PACU - hemodynamically stable.   Delay start of Pharmacological VTE agent (>24hrs) due to surgical blood loss or risk of bleeding: yes

## 2015-09-14 NOTE — Anesthesia Preprocedure Evaluation (Addendum)
Anesthesia Evaluation  Patient identified by MRN, date of birth, ID band Patient awake    Reviewed: Allergy & Precautions, Patient's Chart, lab work & pertinent test results, Unable to perform ROS - Chart review only  History of Anesthesia Complications Negative for: history of anesthetic complications  Airway Mallampati: III  TM Distance: >3 FB Neck ROM: Full  Mouth opening: Limited Mouth Opening  Dental  (+) Missing,    Pulmonary shortness of breath and with exertion, asthma , COPD,  COPD inhaler, former smoker,    Pulmonary exam normal        Cardiovascular Exercise Tolerance: Poor hypertension, Pt. on medications Normal cardiovascular exam     Neuro/Psych Anxiety Depression  Neuromuscular disease    GI/Hepatic hiatal hernia, GERD  Medicated and Controlled,  Endo/Other  diabetes, Poorly Controlled, Type 2, Insulin Dependent, Oral Hypoglycemic Agents  Renal/GU      Musculoskeletal  (+) Arthritis , Osteoarthritis,    Abdominal (+) + obese,   Peds  Hematology   Anesthesia Other Findings   Reproductive/Obstetrics                            Anesthesia Physical Anesthesia Plan  ASA: III  Anesthesia Plan: Bier Block   Post-op Pain Management:    Induction:   Airway Management Planned: Nasal Cannula  Additional Equipment:   Intra-op Plan:   Post-operative Plan:   Informed Consent: I have reviewed the patients History and Physical, chart, labs and discussed the procedure including the risks, benefits and alternatives for the proposed anesthesia with the patient or authorized representative who has indicated his/her understanding and acceptance.   Dental advisory given  Plan Discussed with: CRNA  Anesthesia Plan Comments:         Anesthesia Quick Evaluation

## 2015-09-14 NOTE — Interval H&P Note (Signed)
History and Physical Interval Note:  23/95/3202 33:43 AM  Jamie Harding  has presented today for surgery, with the diagnosis of right carpal tunnel syndrome, right long trigger finger  The various methods of treatment have been discussed with the patient and family. After consideration of risks, benefits and other options for treatment, the patient has consented to  Procedure(s) with comments: CARPAL TUNNEL RELEASE (Right) RELEASE TRIGGER FINGER/A-1 PULLEY (Right) - right long finger as a surgical intervention .  The patient's history has been reviewed, patient examined, no change in status, stable for surgery.  I have reviewed the patient's chart and labs.  Questions were answered to the patient's satisfaction.     Arther Abbott

## 2015-09-14 NOTE — Discharge Instructions (Signed)
Move the fingers and it  is encouraged. Use ice as needed for swelling. Elevate hand as much as possible. Dr. Crissie Reese dressing on office visit.  PATIENT INSTRUCTIONS POST-ANESTHESIA  IMMEDIATELY FOLLOWING SURGERY:  Do not drive or operate machinery for the first twenty four hours after surgery.  Do not make any important decisions for twenty four hours after surgery or while taking narcotic pain medications or sedatives.  If you develop intractable nausea and vomiting or a severe headache please notify your doctor immediately.  FOLLOW-UP:  Please make an appointment with your surgeon as instructed. You do not need to follow up with anesthesia unless specifically instructed to do so.  WOUND CARE INSTRUCTIONS (if applicable):  Keep a dry clean dressing on the anesthesia/puncture wound site if there is drainage.  Once the wound has quit draining you may leave it open to air.  Generally you should leave the bandage intact for twenty four hours unless there is drainage.  If the epidural site drains for more than 36-48 hours please call the anesthesia department.  QUESTIONS?:  Please feel free to call your physician or the hospital operator if you have any questions, and they will be happy to assist you.

## 2015-09-14 NOTE — Brief Op Note (Signed)
09/14/2015  35:46 AM  PATIENT:  Jamie Harding  59 y.o. female  PRE-OPERATIVE DIAGNOSIS:  right carpal tunnel syndrome, right long trigger finger  POST-OPERATIVE DIAGNOSIS:  right carpal tunnel syndrome, right long trigger finger  PROCEDURE:  Procedure(s): RIGHT CARPAL TUNNEL RELEASE (Right) RIGHT LONG FINGER TRIGGER RELEASE (Right)   Surgical findings. The median nerve was encased in the undersurface of the transverse carpal ligament with severe scarring and as far as the right long finger goes there was a bulbous contour of the flexor tendon with stenosis at the A1 pulley  Assisted by Simonne Maffucci  Anesthesia technique was a Bier block  Tourniquet time was approximately 30 minutes  Details of the procedure  The patient was identified in the preop area. The site was confirmed and the chart was reviewed. The patient was taken to the operating room for general anesthesia and she was given preoperative Ancef for surgical prophylaxis  After sterile prep and drape timeout was executed.  The incision was made over the previous carpal tunnel incision and that was used to open the wrist. Blunt dissection was used due to the prior surgery. We extended the dissection to the distal aspect of the transverse carpal ligament and used blunt dissection underneath it and then carefully divided the transverse carpal ligament. We had to release the nerve from the undersurface of the transverse carpal ligament. After irrigation the wound was closed with 3-0 nylon suture in interrupted fashion  We then turned our attention to the right long finger. He made a longitudinal incision over the A1 pulley divided the skin. We protected the neurovascular bundles on each side and then divided the A1 pulley. After flexion-extension revealed no further stenosis we irrigated the wound and closed with 3-0 nylon suture  Sterile dressings were applied and the tourniquet was released all fingers have good  color.  SURGEON:  Surgeon(s) and Role:    * Carole Civil, MD - Primary  PHYSICIAN ASSISTANT:    EBL:  Total I/O In: 200 [I.V.:200] Out: -   BLOOD ADMINISTERED:none  DRAINS: none   LOCAL MEDICATIONS USED:  MARCAINE     SPECIMEN:  No Specimen  DISPOSITION OF SPECIMEN:  N/A  COUNTS:  YES  TOURNIQUET:   Total Tourniquet Time Documented: Upper Arm (Right) - 36 minutes Total: Upper Arm (Right) - 36 minutes   DICTATION: .Viviann Spare Dictation  PLAN OF CARE: Discharge to home after PACU  PATIENT DISPOSITION:  PACU - hemodynamically stable.   Delay start of Pharmacological VTE agent (>24hrs) due to surgical blood loss or risk of bleeding: yes

## 2015-09-14 NOTE — Transfer of Care (Signed)
Immediate Anesthesia Transfer of Care Note  Patient: Jamie Harding  Procedure(s) Performed: Procedure(s): RIGHT CARPAL TUNNEL RELEASE (Right) RIGHT LONG FINGER TRIGGER RELEASE (Right)  Patient Location: PACU  Anesthesia Type:Bier block  Level of Consciousness: awake, alert , oriented and patient cooperative  Airway & Oxygen Therapy: Patient Spontanous Breathing and Patient connected to face mask oxygen  Post-op Assessment: Report given to RN, Post -op Vital signs reviewed and stable and Patient moving all extremities  Post vital signs: Reviewed and stable  Last Vitals:  Filed Vitals:   09/14/15 1040  BP: 137/69  Temp:   Resp: 38    Complications: No apparent anesthesia complications

## 2015-09-14 NOTE — Anesthesia Procedure Notes (Signed)
Anesthesia Regional Block:  Bier block (IV Regional)  Pre-Anesthetic Checklist: ,,, Correct Patient, Correct Site, Correct Laterality, Correct Procedure, Correct Position, site marked, risks and benefits discussed, Surgical consent, Pre-op evaluation  Laterality: Right  Prep: Betadine       Needles:  Injection technique: Single-shot      Additional Needles: Bier block (IV Regional) Narrative:  Start time: 09/14/2015 10:52 AM

## 2015-09-14 NOTE — Anesthesia Postprocedure Evaluation (Signed)
  Anesthesia Post-op Note  Patient: Jamie Harding  Procedure(s) Performed: Procedure(s): RIGHT CARPAL TUNNEL RELEASE (Right) RIGHT LONG FINGER TRIGGER RELEASE (Right)  Patient Location: PACU  Anesthesia Type:Bier block  Level of Consciousness: awake, alert , oriented and patient cooperative  Airway and Oxygen Therapy: Patient Spontanous Breathing  Post-op Pain: 2 /10, mild  Post-op Assessment: Post-op Vital signs reviewed, Patient's Cardiovascular Status Stable, Respiratory Function Stable, Patent Airway, No signs of Nausea or vomiting and Pain level controlled              Post-op Vital Signs: Reviewed and stable  Last Vitals:  Filed Vitals:   09/14/15 1248  BP: 124/65  Pulse: 70  Temp: 36.5 C  Resp: 18    Complications: No apparent anesthesia complications

## 2015-09-15 ENCOUNTER — Encounter (HOSPITAL_COMMUNITY): Payer: Self-pay | Admitting: Orthopedic Surgery

## 2015-09-19 ENCOUNTER — Ambulatory Visit (INDEPENDENT_AMBULATORY_CARE_PROVIDER_SITE_OTHER): Payer: 59 | Admitting: Orthopedic Surgery

## 2015-09-19 ENCOUNTER — Encounter: Payer: Self-pay | Admitting: Orthopedic Surgery

## 2015-09-19 VITALS — BP 114/73 | Ht 62.0 in | Wt 232.0 lb

## 2015-09-19 DIAGNOSIS — M6530 Trigger finger, unspecified finger: Secondary | ICD-10-CM

## 2015-09-19 DIAGNOSIS — G5603 Carpal tunnel syndrome, bilateral upper limbs: Secondary | ICD-10-CM

## 2015-09-19 NOTE — Patient Instructions (Addendum)
Surgery November 9 Left Carpal tunnel release and Left ring finger trigger finger release + right CTR, rlf  sutures out

## 2015-09-19 NOTE — Progress Notes (Signed)
Chief Complaint  Patient presents with  . Follow-up    post op 1, RT CTR, RT TFR, DOS 09/14/15    The patient had a right carpal tunnel release revision and a right long finger trigger finger release. Doing well. She wants the left side done. When I do the left side up take the sutures out for the right central do that on November 9. If for any reason we can do November 9 and we need to set up a time to take out the right carpal tunnel sutures  On November 9 we will perform a left carpal tunnel release and a left ring finger trigger finger release

## 2015-09-20 ENCOUNTER — Other Ambulatory Visit: Payer: Self-pay

## 2015-09-20 ENCOUNTER — Encounter: Payer: Self-pay | Admitting: *Deleted

## 2015-09-20 ENCOUNTER — Encounter: Payer: 59 | Attending: "Endocrinology | Admitting: *Deleted

## 2015-09-20 DIAGNOSIS — Z794 Long term (current) use of insulin: Secondary | ICD-10-CM | POA: Diagnosis not present

## 2015-09-20 DIAGNOSIS — Z713 Dietary counseling and surveillance: Secondary | ICD-10-CM | POA: Diagnosis not present

## 2015-09-20 DIAGNOSIS — E1159 Type 2 diabetes mellitus with other circulatory complications: Secondary | ICD-10-CM | POA: Insufficient documentation

## 2015-09-20 MED ORDER — ONETOUCH ULTRA SYSTEM W/DEVICE KIT: 1.0000 | PACK | Freq: Four times a day (QID) | Status: DC

## 2015-09-20 MED ORDER — GLUCOSE BLOOD VI STRP: ORAL_STRIP | Status: DC

## 2015-09-20 NOTE — Patient Instructions (Signed)
Plan:  Aim for 2 Carb Choices per meal (30 grams) +/- 1 either way  Aim for 0-1 Carbs per snack if hungry. Try not to snack unless treating a low BG Include protein in moderation with your meals and snacks Consider reading food labels for Total Carbohydrate of foods Continue with your activity level daily as tolerated Continue checking BG at alternate times per day as directed by MD  Continue taking medication as directed by MD

## 2015-09-20 NOTE — Progress Notes (Signed)
Diabetes Self-Management Education  Visit Type: First/Initial  Appt. Start Time: 1400 Appt. End Time: 5631  09/20/2015  [name], identified by name and date of birth, is a 59 y.o. female with a diagnosis of Diabetes: Type 2.   ASSESSMENT  There were no vitals taken for this visit. There is no weight on file to calculate BMI.      Diabetes Self-Management Education - 09/20/15 1408    Visit Information   Visit Type First/Initial   Initial Visit   Diabetes Type Type 2   Are you currently following a meal plan? Yes   What type of meal plan do you follow? Diabetic   Are you taking your medications as prescribed? Yes   Date Diagnosed [place]   How would you rate your overall health? Poor   Psychosocial Assessment   Patient Belief/Attitude about Diabetes Motivated to manage diabetes  afraid and in denial   Self-care barriers None   Self-management support Doctor's office   Other persons present Patient   Patient Concerns Nutrition/Meal planning;Weight Control;Glycemic Control   Special Needs None   Preferred Learning Style Auditory;Visual;Hands on   How often do you need to have someone help you when you read instructions, pamphlets, or other written materials from your doctor or pharmacy? 1 - Never   What is the last grade level you completed in school? 12 th grade   Complications   Last HgB A1C per patient/outside source 9.5 %   How often do you check your blood sugar? 3-4 times/day   Fasting Blood glucose range (mg/dL) 70-129   Postprandial Blood glucose range (mg/dL) 180-200   Number of hypoglycemic episodes per month 3   Have you had a dilated eye exam in the past 12 months? Yes   Have you had a dental exam in the past 12 months? Yes   Are you checking your feet? Yes   How many days per week are you checking your feet? 1   Dietary Intake   Breakfast fresh fruit with yogurt, sometimes with granola,    Snack (morning) not now   [employer] meat  with cheese, no bread, vegetables, pickles   Dinner meat, sweet potatoes, beans (green or peas)   Snack (evening) not now, unless small bag of peanuts or rice cakes   Beverage(s) water, diet soda occasionally   Exercise   Exercise Type Light (walking / raking leaves)  walks, stair stepper, stationary bike at home   How many days per week to you exercise? 4   How many minutes per day do you exercise? 20   Total minutes per week of exercise 80   Patient Education   Previous Diabetes Education Yes (please comment)   Disease state  Definition of diabetes, type 1 and 2, and the diagnosis of diabetes   Nutrition management  Role of diet in the treatment of diabetes and the relationship between the three main macronutrients and blood glucose level;Food label reading, portion sizes and measuring food.;Carbohydrate counting   Physical activity and exercise  Role of exercise on diabetes management, blood pressure control and cardiac health.   Monitoring Identified appropriate SMBG and/or A1C goals.   Acute complications Taught treatment of hypoglycemia - the 15 rule.   Chronic complications Relationship between chronic complications and blood glucose control   Psychosocial adjustment Role of stress on diabetes   Individualized Goals (developed by patient)   Nutrition Follow meal plan discussed   Physical Activity Exercise  3-5 times per week   Medications take my medication as prescribed   Monitoring  test blood glucose pre and post meals as discussed   Outcomes   Expected Outcomes Demonstrated interest in learning. Expect positive outcomes   Future DMSE 4-6 wks   Program Status Not Completed      Individualized Plan for Diabetes Self-Management Training:   Learning Objective:  Patient will have a greater understanding of diabetes self-management. Patient education plan is to attend individual and/or group sessions per assessed needs and concerns.   Plan:   Patient Instructions  Plan:   Aim for 2 Carb Choices per meal (30 grams) +/- 1 either way  Aim for 0-1 Carbs per snack if hungry. Try not to snack unless treating a low BG Include protein in moderation with your meals and snacks Consider reading food labels for Total Carbohydrate of foods Continue with your activity level daily as tolerated Continue checking BG at alternate times per day as directed by MD  Continue taking medication as directed by MD      Expected Outcomes:  Demonstrated interest in learning. Expect positive outcomes  Education material provided: Living Well with Diabetes, A1C conversion sheet, Meal plan card and Carbohydrate counting sheet  If problems or questions, patient to contact team via:  Phone and Email  Future DSME appointment: 4-6 wks

## 2015-09-20 NOTE — Addendum Note (Signed)
Addended by: Baldomero Lamy B on: 09/20/2015 12:25 PM   Modules accepted: Orders

## 2015-09-22 ENCOUNTER — Encounter (HOSPITAL_COMMUNITY): Payer: Self-pay

## 2015-09-23 ENCOUNTER — Encounter (HOSPITAL_COMMUNITY)
Admission: RE | Admit: 2015-09-23 | Discharge: 2015-09-23 | Disposition: A | Discharge status: Home / Self Care | Payer: 59 | Source: Ambulatory Visit | Attending: Orthopedic Surgery | Admitting: Orthopedic Surgery

## 2015-09-26 NOTE — H&P (Signed)
Jamie Harding is an 59 y.o. female.   Chief Complaint: Numbness and tingling recurrent left hand with triggering of the left ring finger HPI: 59 year old female had previous left carpal tunnel release and has re-onset of symptoms. She is status post right carpal tunnel release and trigger finger release approximately 2 weeks ago. She presents now for left carpal tunnel release and left ring finger trigger release for pain over the A1 pulley of the left ring finger with catching locking and numbness and tingling in the median nerve distribution of the left hand associated with night pain and dropping objects and difficulties with fine motor tasks    Past Medical History  Diagnosis Date  . DM (diabetes mellitus) ([place])   . HTN (hypertension)   . Obese   . Depression   . Anxiety   . HLD (hyperlipidemia)   . Hiatal hernia   . Asthma   . GERD (gastroesophageal reflux disease)   . Fatty liver   . Gastritis   . Pancreatitis   . Tibialis tendinitis   . Chest pain     Nuclear  February, 2012,,  breast attenuation, no definite scar or ischemia  . Gastroparesis     NEGATIVE GES 2010  . Rheumatoid arthritis(714.0)   . Stroke [facility]) 2008    no deficits  . Neuropathy ([place])   . Ejection fraction     EF 60%, echo, February, 2012, mild LVH  . Family history of anesthesia complication     post anesthesia N/V  . History of kidney stones     Past Surgical History  Procedure Laterality Date  . Cesarean section    . Cholecystectomy    . Ankle surgery      remove extra bone-left  . Finger surgery Left     left little finger-otif of finger  . Lithotripsy    . Dilation and curettage of uterus      multiple  . Laser ablation of the cervix    . Carpal tunnel release      right side  . Tubal ligation    . Gastric emptying  07/27/2009    The amount of activity in the stomach at 120 minutes was 13% which is in the normal range  . Esophagogastroduodenoscopy  07/29/2009    Mild gastritis, benign  path  . Colonoscopy with propofol  09/16/2012    ZES:PQZR sessile polyps ranging between 3-35mm in size were found in the sigmoid colon and rectum; polypectomy was performed/Mild diverticulosis was noted throughout the entire examined colon/Small internal hemorrhoids  . Esophagogastroduodenoscopy (egd) with propofol  09/16/2012    SLF:Non-erosive gastritis (inflammation) was found; multiple bx/The duodenal mucosa showed no abnormalities in the ampulla and bulb and second portion of the duodenum/NAUSEA/VOMITING MOST LIKELY DUE TO GERD/GASTRITIS  . Savory dilation  09/16/2012    Procedure: SAVORY DILATION;  Surgeon: Danie Binder, MD;  Location: [facility];  Service: Endoscopy;  Laterality: N/A;  16 fr dilation  . Polypectomy  09/16/2012    Procedure: POLYPECTOMY;  Surgeon: Danie Binder, MD;  Location: [facility];  Service: Endoscopy;  Laterality: N/A;  . Carpal tunnel release Left 02/06/2013    Procedure: CARPAL TUNNEL RELEASE ;  Surgeon: Carole Civil, MD;  Location: [facility];  Service: Orthopedics;  Laterality: Left;  procedure end 1135  . Trigger finger release Left 02/06/2013    Procedure: RELEASE TRIGGER FINGER LEFT LONG FINGER/A-1 PULLEY;  Surgeon: Carole Civil, MD;  Location: [facility];  Service:  Orthopedics;  Laterality: Left;  procedure began 1136  . Left and right heart catheterization with coronary angiogram N/A 01/03/2015    Procedure: LEFT AND RIGHT HEART CATHETERIZATION WITH CORONARY ANGIOGRAM;  Surgeon: Blane Ohara, MD;  Location: [facility] CATH LAB;  Service: Cardiovascular;  Laterality: N/A;  . Carpal tunnel release Right 09/14/2015    Procedure: RIGHT CARPAL TUNNEL RELEASE;  Surgeon: Carole Civil, MD;  Location: [facility];  Service: Orthopedics;  Laterality: Right;  . Trigger finger release Right 09/14/2015    Procedure: RIGHT LONG FINGER TRIGGER RELEASE;  Surgeon: Carole Civil, MD;  Location: [facility];  Service: Orthopedics;  Laterality: Right;    Family History  Problem  Relation Age of Onset  . Breast cancer Mother   . Diabetes Father   . Coronary artery disease    . Arthritis    . Asthma    . Colon cancer Neg Hx   . Diabetes Sister    Social History:  reports that she quit smoking about 28 years ago. Her smoking use included Cigarettes. She has a 1.5 pack-year smoking history. She does not have any smokeless tobacco history on file. She reports that she does not drink alcohol or use illicit drugs.  Allergies:  Allergies  Allergen Reactions  . Byetta 10 Mcg Pen [Exenatide] Diarrhea and Nausea And Vomiting      touch of pancreatis  . Naproxen Nausea And Vomiting  . Augmentin [Amoxicillin-Pot Clavulanate]     diarrhea  . Azithromycin Nausea And Vomiting and Rash  . Erythromycin Hives       . Invokana [Canagliflozin]     Urinary issues  . Morphine Hives and Rash  . Sulfonamide Derivatives Nausea And Vomiting and Rash    No prescriptions prior to admission       Review of Systems  All other systems reviewed and are negative.   There were no vitals taken for this visit. Physical Exam  Nursing note and vitals reviewed. Constitutional: She is oriented to person, place, and time. She appears well-developed and well-nourished. No distress.  HENT:  Head: Normocephalic and atraumatic.  Eyes: Conjunctivae are normal.  Neck: Neck supple. No JVD present.  Cardiovascular: Normal rate.   Respiratory: Effort normal.  GI: She exhibits no distension.  Musculoskeletal: Normal range of motion.  See detailed exam   Lymphadenopathy:    She has no cervical adenopathy.  Neurological: She is alert and oriented to person, place, and time. She displays normal reflexes. She exhibits abnormal muscle tone.  Skin: Skin is warm and dry. No rash noted. She is not diaphoretic. No erythema. No pallor.  Psychiatric: She has a normal mood and affect. Her behavior is normal. Judgment and thought content normal.   focuses on the left upper extremity and primarily  the left hand wrist.  Previous incision noted from left carpal tunnel release. She has tenderness over the left carpal tunnel. There is no swelling of the hand. She has full range of motion. There is tenderness over the A1 pulley of the left ring finger. There is slight catching there. Motor exam shows no atrophy in the thenar hyperthenar musculature. Forearm musculature normal. Grip strength seems weak but there is no comparison secondary to patient having surgery on the right within the last 3 weeks. No wrist instability or elbow instability is detected. Sensation shows decreased sensation in the median distribution to soft touch and to pinprick. Vascularity shows normal capillary refill good distal radial and ulnar pulses.  Assessment/Plan Carpal tunnel syndrome left Left ring trigger phenomena so call trigger finger  Plan open left carpal tunnel release repeat.  Release A1 pulley left ring finger   Arther Abbott 09/26/2015, 8:40 AM

## 2015-09-28 ENCOUNTER — Encounter (HOSPITAL_COMMUNITY): Payer: Self-pay | Admitting: *Deleted

## 2015-09-28 ENCOUNTER — Telehealth: Payer: Self-pay | Admitting: Orthopedic Surgery

## 2015-09-28 ENCOUNTER — Ambulatory Visit (HOSPITAL_COMMUNITY): Payer: 59 | Admitting: Anesthesiology

## 2015-09-28 ENCOUNTER — Ambulatory Visit (HOSPITAL_COMMUNITY)
Admission: RE | Admit: 2015-09-28 | Discharge: 2015-09-28 | Disposition: A | Discharge status: Home / Self Care | Payer: 59 | Source: Ambulatory Visit | Attending: Orthopedic Surgery | Admitting: Orthopedic Surgery

## 2015-09-28 ENCOUNTER — Encounter (HOSPITAL_COMMUNITY)
Admission: RE | Disposition: A | Discharge status: Home / Self Care | Payer: Self-pay | Source: Ambulatory Visit | Attending: Orthopedic Surgery

## 2015-09-28 DIAGNOSIS — Z7982 Long term (current) use of aspirin: Secondary | ICD-10-CM | POA: Diagnosis not present

## 2015-09-28 DIAGNOSIS — K219 Gastro-esophageal reflux disease without esophagitis: Secondary | ICD-10-CM | POA: Diagnosis not present

## 2015-09-28 DIAGNOSIS — E669 Obesity, unspecified: Secondary | ICD-10-CM | POA: Diagnosis not present

## 2015-09-28 DIAGNOSIS — Z794 Long term (current) use of insulin: Secondary | ICD-10-CM | POA: Insufficient documentation

## 2015-09-28 DIAGNOSIS — G5602 Carpal tunnel syndrome, left upper limb: Secondary | ICD-10-CM

## 2015-09-28 DIAGNOSIS — F418 Other specified anxiety disorders: Secondary | ICD-10-CM | POA: Insufficient documentation

## 2015-09-28 DIAGNOSIS — J449 Chronic obstructive pulmonary disease, unspecified: Secondary | ICD-10-CM | POA: Insufficient documentation

## 2015-09-28 DIAGNOSIS — E119 Type 2 diabetes mellitus without complications: Secondary | ICD-10-CM | POA: Insufficient documentation

## 2015-09-28 DIAGNOSIS — M65342 Trigger finger, left ring finger: Secondary | ICD-10-CM | POA: Diagnosis not present

## 2015-09-28 DIAGNOSIS — Z79899 Other long term (current) drug therapy: Secondary | ICD-10-CM | POA: Diagnosis not present

## 2015-09-28 DIAGNOSIS — I10 Essential (primary) hypertension: Secondary | ICD-10-CM | POA: Diagnosis not present

## 2015-09-28 DIAGNOSIS — Z7984 Long term (current) use of oral hypoglycemic drugs: Secondary | ICD-10-CM | POA: Diagnosis not present

## 2015-09-28 DIAGNOSIS — M1990 Unspecified osteoarthritis, unspecified site: Secondary | ICD-10-CM | POA: Diagnosis not present

## 2015-09-28 HISTORY — PX: CARPAL TUNNEL RELEASE: SHX101

## 2015-09-28 HISTORY — PX: TRIGGER FINGER RELEASE: SHX641

## 2015-09-28 LAB — GLUCOSE, CAPILLARY
GLUCOSE-CAPILLARY: 278 mg/dL — AB (ref 65–99)
GLUCOSE-CAPILLARY: 168 mg/dL — AB (ref 65–99)

## 2015-09-28 SURGERY — CARPAL TUNNEL RELEASE
Anesthesia: Regional | Site: Wrist | Laterality: Left

## 2015-09-28 MED ORDER — BUPIVACAINE HCL (PF) 0.5 % IJ SOLN
INTRAMUSCULAR | Status: AC
  Filled 2015-09-28: qty 30

## 2015-09-28 MED ORDER — MIDAZOLAM HCL 2 MG/2ML IJ SOLN
INTRAMUSCULAR | Status: AC
  Filled 2015-09-28: qty 2

## 2015-09-28 MED ORDER — LIDOCAINE HCL (PF) 0.5 % IJ SOLN
INTRAMUSCULAR | Status: AC
Start: 2015-09-28 — End: 2015-09-28
  Filled 2015-09-28: qty 50

## 2015-09-28 MED ORDER — FENTANYL CITRATE (PF) 100 MCG/2ML IJ SOLN: 25.0000 ug | INTRAMUSCULAR | Status: DC | PRN

## 2015-09-28 MED ORDER — LIDOCAINE HCL (PF) 1 % IJ SOLN
INTRAMUSCULAR | Status: AC
Start: 2015-09-28 — End: 2015-09-28
  Filled 2015-09-28: qty 5

## 2015-09-28 MED ORDER — LACTATED RINGERS IV SOLN
INTRAVENOUS | Status: DC
  Administered 2015-09-28: 11:00:00 via INTRAVENOUS

## 2015-09-28 MED ORDER — VANCOMYCIN HCL 10 G IV SOLR
1500.0000 mg | INTRAVENOUS | Status: AC
  Administered 2015-09-28: 1500 mg via INTRAVENOUS
  Filled 2015-09-28: qty 1500

## 2015-09-28 MED ORDER — LIDOCAINE HCL (PF) 0.5 % IJ SOLN
INTRAMUSCULAR | Status: DC | PRN
  Administered 2015-09-28: 50 mL

## 2015-09-28 MED ORDER — ONDANSETRON HCL 4 MG/2ML IJ SOLN
INTRAMUSCULAR | Status: AC
  Filled 2015-09-28: qty 2

## 2015-09-28 MED ORDER — FENTANYL CITRATE (PF) 100 MCG/2ML IJ SOLN
INTRAMUSCULAR | Status: AC
  Filled 2015-09-28: qty 2

## 2015-09-28 MED ORDER — PROPOFOL 500 MG/50ML IV EMUL
INTRAVENOUS | Status: DC | PRN
  Administered 2015-09-28: 50 ug/kg/min via INTRAVENOUS

## 2015-09-28 MED ORDER — PROPOFOL 10 MG/ML IV BOLUS
INTRAVENOUS | Status: AC
  Filled 2015-09-28: qty 20

## 2015-09-28 MED ORDER — VANCOMYCIN HCL IN DEXTROSE 1-5 GM/200ML-% IV SOLN
INTRAVENOUS | Status: AC
  Filled 2015-09-28: qty 200

## 2015-09-28 MED ORDER — MIDAZOLAM HCL 5 MG/5ML IJ SOLN
INTRAMUSCULAR | Status: DC | PRN
  Administered 2015-09-28: 2 mg via INTRAVENOUS

## 2015-09-28 MED ORDER — LACTATED RINGERS IV SOLN
INTRAVENOUS | Status: DC | PRN
  Administered 2015-09-28: 10:00:00 via INTRAVENOUS

## 2015-09-28 MED ORDER — MIDAZOLAM HCL 2 MG/2ML IJ SOLN
1.0000 mg | INTRAMUSCULAR | Status: DC | PRN
  Administered 2015-09-28: 2 mg via INTRAVENOUS

## 2015-09-28 MED ORDER — MIDAZOLAM HCL 2 MG/2ML IJ SOLN
INTRAMUSCULAR | Status: AC
  Filled 2015-09-28: qty 4

## 2015-09-28 MED ORDER — BUPIVACAINE HCL (PF) 0.5 % IJ SOLN
INTRAMUSCULAR | Status: DC | PRN
  Administered 2015-09-28: 10 mL

## 2015-09-28 MED ORDER — FENTANYL CITRATE (PF) 100 MCG/2ML IJ SOLN
INTRAMUSCULAR | Status: DC | PRN
  Administered 2015-09-28 (×2): 25 ug via INTRAVENOUS
  Administered 2015-09-28: 50 ug via INTRAVENOUS

## 2015-09-28 MED ORDER — CHLORHEXIDINE GLUCONATE 4 % EX LIQD: 60.0000 mL | Freq: Once | CUTANEOUS | Status: DC

## 2015-09-28 MED ORDER — FENTANYL CITRATE (PF) 100 MCG/2ML IJ SOLN
INTRAMUSCULAR | Status: AC
  Filled 2015-09-28: qty 4

## 2015-09-28 MED ORDER — ONDANSETRON HCL 4 MG/2ML IJ SOLN
4.0000 mg | Freq: Once | INTRAMUSCULAR | Status: AC
  Administered 2015-09-28: 4 mg via INTRAVENOUS

## 2015-09-28 MED ORDER — FENTANYL CITRATE (PF) 100 MCG/2ML IJ SOLN
25.0000 ug | INTRAMUSCULAR | Status: AC
  Administered 2015-09-28 (×2): 25 ug via INTRAVENOUS

## 2015-09-28 MED ORDER — HYDROCODONE-ACETAMINOPHEN 5-325 MG PO TABS: 30.0000 | ORAL_TABLET | Freq: Four times a day (QID) | ORAL | Status: DC | PRN

## 2015-09-28 MED ORDER — 0.9 % SODIUM CHLORIDE (POUR BTL) OPTIME
TOPICAL | Status: DC | PRN
  Administered 2015-09-28: 1000 mL

## 2015-09-28 MED ORDER — ONDANSETRON HCL 4 MG/2ML IJ SOLN: 4.0000 mg | Freq: Once | INTRAMUSCULAR | Status: DC | PRN

## 2015-09-28 SURGICAL SUPPLY — 54 items
BAG HAMPER (MISCELLANEOUS) ×4 IMPLANT
BANDAGE ELASTIC 3 VELCRO NS (GAUZE/BANDAGES/DRESSINGS) ×4 IMPLANT
BANDAGE ESMARK 4X12 BL STRL LF (DISPOSABLE) ×2 IMPLANT
BLADE SURG 15 STRL LF DISP TIS (BLADE) ×2 IMPLANT
BLADE SURG 15 STRL SS (BLADE) ×4
BNDG CMPR 12X4 ELC STRL LF (DISPOSABLE) ×2
BNDG CMPR MD 5X2 ELC HKLP STRL (GAUZE/BANDAGES/DRESSINGS) ×2
BNDG COHESIVE 4X5 TAN STRL (GAUZE/BANDAGES/DRESSINGS) ×4 IMPLANT
BNDG CONFORM 2 STRL LF (GAUZE/BANDAGES/DRESSINGS) IMPLANT
BNDG ELASTIC 2 VLCR STRL LF (GAUZE/BANDAGES/DRESSINGS) ×4 IMPLANT
BNDG ESMARK 4X12 BLUE STRL LF (DISPOSABLE) ×4
BNDG GAUZE ELAST 4 BULKY (GAUZE/BANDAGES/DRESSINGS) ×2 IMPLANT
CHLORAPREP W/TINT 26ML (MISCELLANEOUS) ×4 IMPLANT
CLOTH BEACON ORANGE TIMEOUT ST (SAFETY) ×4 IMPLANT
COVER LIGHT HANDLE STERIS (MISCELLANEOUS) ×12 IMPLANT
CUFF TOURNIQUET SINGLE 18IN (TOURNIQUET CUFF) ×4 IMPLANT
CUFF TOURNIQUET SINGLE 24IN (TOURNIQUET CUFF) IMPLANT
DECANTER SPIKE VIAL GLASS SM (MISCELLANEOUS) ×4 IMPLANT
DRAPE PROXIMA HALF (DRAPES) ×4 IMPLANT
DRSG XEROFORM 1X8 (GAUZE/BANDAGES/DRESSINGS) IMPLANT
ELECT NDL TIP 2.8 STRL (NEEDLE) ×2 IMPLANT
ELECT NEEDLE TIP 2.8 STRL (NEEDLE) ×4 IMPLANT
ELECT REM PT RETURN 9FT ADLT (ELECTROSURGICAL) ×4
ELECTRODE REM PT RTRN 9FT ADLT (ELECTROSURGICAL) ×2 IMPLANT
GAUZE SPONGE 4X4 12PLY STRL (GAUZE/BANDAGES/DRESSINGS) IMPLANT
GAUZE XEROFORM 5X9 LF (GAUZE/BANDAGES/DRESSINGS) ×2 IMPLANT
GLOVE BIO SURGEON STRL SZ 6.5 (GLOVE) ×1 IMPLANT
GLOVE BIO SURGEONS STRL SZ 6.5 (GLOVE) ×1
GLOVE BIOGEL PI IND STRL 7.0 (GLOVE) IMPLANT
GLOVE BIOGEL PI IND STRL 7.5 (GLOVE) IMPLANT
GLOVE BIOGEL PI INDICATOR 7.0 (GLOVE) ×4
GLOVE BIOGEL PI INDICATOR 7.5 (GLOVE) ×2
GLOVE ECLIPSE 6.5 STRL STRAW (GLOVE) ×2 IMPLANT
GLOVE SKINSENSE NS SZ8.0 LF (GLOVE) ×2
GLOVE SKINSENSE STRL SZ8.0 LF (GLOVE) ×2 IMPLANT
GLOVE SS N UNI LF 8.5 STRL (GLOVE) ×4 IMPLANT
GOWN STRL REUS W/ TWL LRG LVL3 (GOWN DISPOSABLE) ×2 IMPLANT
GOWN STRL REUS W/TWL LRG LVL3 (GOWN DISPOSABLE) ×12 IMPLANT
GOWN STRL REUS W/TWL XL LVL3 (GOWN DISPOSABLE) ×4 IMPLANT
HAND ALUMI XLG (SOFTGOODS) ×4 IMPLANT
KIT ROOM TURNOVER APOR (KITS) ×4 IMPLANT
MANIFOLD NEPTUNE II (INSTRUMENTS) ×4 IMPLANT
NDL HYPO 21X1.5 SAFETY (NEEDLE) ×2 IMPLANT
NEEDLE HYPO 21X1.5 SAFETY (NEEDLE) ×4 IMPLANT
NS IRRIG 1000ML POUR BTL (IV SOLUTION) ×4 IMPLANT
PACK BASIC LIMB (CUSTOM PROCEDURE TRAY) ×4 IMPLANT
PAD ARMBOARD 7.5X6 YLW CONV (MISCELLANEOUS) ×4 IMPLANT
SET BASIN LINEN APH (SET/KITS/TRAYS/PACK) ×4 IMPLANT
SPONGE GAUZE 2X2 8PLY STER LF (GAUZE/BANDAGES/DRESSINGS)
SPONGE GAUZE 2X2 8PLY STRL LF (GAUZE/BANDAGES/DRESSINGS) IMPLANT
SPONGE GAUZE 4X4 12PLY (GAUZE/BANDAGES/DRESSINGS) ×2 IMPLANT
SUT ETHILON 3 0 FSL (SUTURE) ×4 IMPLANT
SYR CONTROL 10ML LL (SYRINGE) ×4 IMPLANT
SYRINGE 10CC LL (SYRINGE) IMPLANT

## 2015-09-28 NOTE — Discharge Instructions (Signed)
PATIENT INSTRUCTIONS POST-ANESTHESIA  IMMEDIATELY FOLLOWING SURGERY:  Do not drive or operate machinery for the first twenty four hours after surgery.  Do not make any important decisions for twenty four hours after surgery or while taking narcotic pain medications or sedatives.  If you develop intractable nausea and vomiting or a severe headache please notify your doctor immediately.  FOLLOW-UP:  Please make an appointment with your surgeon as instructed. You do not need to follow up with anesthesia unless specifically instructed to do so.  WOUND CARE INSTRUCTIONS (if applicable):  Keep a dry clean dressing on the anesthesia/puncture wound site if there is drainage.  Once the wound has quit draining you may leave it open to air.  Generally you should leave the bandage intact for twenty four hours unless there is drainage.  If the epidural site drains for more than 36-48 hours please call the anesthesia department.  QUESTIONS?:  Please feel free to call your physician or the hospital operator if you have any questions, and they will be happy to assist you.      Trigger Finger Trigger finger (digital tendinitis and stenosing tenosynovitis) is a common disorder that causes an often painful catching of the fingers or thumb. It occurs as a clicking, snapping, or locking of a finger in the palm of the hand. This is caused by a problem with the tendons that flex or bend the fingers sliding smoothly through their sheaths. The condition may occur in any finger or a couple fingers at the same time.  The finger may lock with the finger curled or suddenly straighten out with a snap. This is more common in patients with rheumatoid arthritis and diabetes. Left untreated, the condition may get worse to the point where the finger becomes locked in flexion, like making a fist, or less commonly locked with the finger straightened out. CAUSES   Inflammation and scarring that lead to swelling around the tendon  sheath.  Repeated or forceful movements.  Rheumatoid arthritis, an autoimmune disease that affects joints.  Gout.  Diabetes mellitus. SIGNS AND SYMPTOMS  Soreness and swelling of your finger.  A painful clicking or snapping as you bend and straighten your finger. DIAGNOSIS  Your health care provider will do a physical exam of your finger to diagnose trigger finger. TREATMENT   Splinting for 6-8 weeks may be helpful.  Nonsteroidal anti-inflammatory medicines (NSAIDs) can help to relieve the pain and inflammation.  Cortisone injections, along with splinting, may speed up recovery. Several injections may be required. Cortisone may give relief after one injection.  Surgery is another treatment that may be used if conservative treatments do not work. Surgery can be minor, without incisions (a cut does not have to be made), and can be done with a needle through the skin.  Other surgical choices involve an open procedure in which the surgeon opens the hand through a small incision and cuts the pulley so the tendon can again slide smoothly. Your hand will still work fine. HOME CARE INSTRUCTIONS  Apply ice to the injured area, twice per day:  Put ice in a plastic bag.  Place a towel between your skin and the bag.  Leave the ice on for 20 minutes, 3-4 times a day.  Rest your hand often. MAKE SURE YOU:   Understand these instructions.  Will watch your condition.  Will get help right away if you are not doing well or get worse.   This information is not intended to replace advice given  to you by your health care provider. Make sure you discuss any questions you have with your health care provider.   Document Released: 08/25/2004 Document Revised: 07/08/2013 Document Reviewed: 04/07/2013 Elsevier Interactive Patient Education 2016 [place] After Refer to this sheet in the next few weeks. These instructions provide you with information about  caring for yourself after your procedure. Your health care provider may also give you more specific instructions. Your treatment has been planned according to current medical practices, but problems sometimes occur. Call your health care provider if you have any problems or questions after your procedure. WHAT TO EXPECT AFTER THE PROCEDURE After your procedure, it is typical to have the following:  Pain.  Numbness.  Tingling.  Swelling.  Stiffness.  Bruising. HOME CARE INSTRUCTIONS  Take medicines only as directed by your health care provider.  There are many different ways to close and cover an incision, including stitches (sutures), skin glue, and adhesive strips. Follow your health care provider's instructions about:  Incision care.  Bandage (dressing) changes and removal.  Incision closure removal.  Wear a splint or a brace as directed by your surgeon. You may need to do this for 2-3 weeks.  Keep your hand raised (elevated) above the level of your heart while you are resting. Move your fingers often.  Avoid activities that cause hand pain.  Ask your surgeon when you can start to do all of your usual activities again, such as:  Driving.  Returning to work.  Bathing and swimming.  Keep all follow-up visits as directed by your health care provider. This is important. You may need physical therapy for several months to speed healing and regain movement. SEEK MEDICAL CARE IF:  You have drainage, redness, swelling, or pain at your incision site.  You have a fever.  You have chills.  Your pain medicine is not working.  Your symptoms do not go away after 2 months.  Your symptoms go away and then return. SEEK IMMEDIATE MEDICAL CARE IF:  You have pain or numbness that is getting worse.  Your fingers change color.  You are not able to move your fingers.   This information is not intended to replace advice given to you by your health care provider. Make sure you  discuss any questions you have with your health care provider.   Document Released: 05/25/2005 Document Revised: 11/26/2014 Document Reviewed: 06/23/2014 Elsevier Interactive Patient Education [employer].

## 2015-09-28 NOTE — Addendum Note (Signed)
Addendum  created 09/28/15 1318 by Mickel Baas, CRNA   Modules edited: Charges VN

## 2015-09-28 NOTE — Op Note (Signed)
09/28/2015  67:61 PM  PATIENT:  Jamie Harding  59 y.o. female  PRE-OPERATIVE DIAGNOSIS:  left carpal tunnel syndrome, left ring trigger finger  POST-OPERATIVE DIAGNOSIS:  left carpal tunnel syndrome, left ring trigger finger  PROCEDURE:  Procedure(s) with comments: LEFT CARPAL TUNNEL RELEASE (Left) - procedure 1 LEFT RING TRIGGER FINGER RELEASE (Left) - left ring finger  Findings: recurrent carpal tunnel stenosis and stenosing tenosynovitis left ring finger.  Details: The patient was identified in the preop holding area.  She was taken to surgery after site confirmation and site marking and chart review.  She had a Bier block  The previous incision the left carpal tunnel was opened with blunt dissection. We extended the incision slightly defined normal tissue planes and then used blunt dissection to open up the transverse carpal ligament the carpal tunnel was stenotic. The nerve was scarred. Irrigation was performed and the wound was closed with 3-0 nylon suture  We then made a longitudinal incision over the left ring finger used blunt dissection to find the A1 pulley and then released the A1 pulley  Interrupted nylon sutures were used to close the wound  We then injected Marcaine into each wound and placed a sterile bandage and released the tourniquet fingers were viable color was good capillary refill was excellent.  SURGEON:  Surgeon(s) and Role:    * Carole Civil, MD - Primary  PHYSICIAN ASSISTANT:   ASSISTANTS: none   ANESTHESIA:   regional  EBL:  Total I/O In: 550 [I.V.:550] Out: 0   BLOOD ADMINISTERED:none  DRAINS: none   LOCAL MEDICATIONS USED:  MARCAINE     SPECIMEN:  No Specimen  DISPOSITION OF SPECIMEN:  N/A  COUNTS:  YES  TOURNIQUET:   Total Tourniquet Time Documented: Upper Arm (Left) - 43 minutes Total: Upper Arm (Left) - 43 minutes   DICTATION: .Viviann Spare Dictation  PLAN OF CARE: Discharge to home after PACU  PATIENT DISPOSITION:   PACU - hemodynamically stable.   Delay start of Pharmacological VTE agent (>24hrs) due to surgical blood loss or risk of bleeding: not applicable

## 2015-09-28 NOTE — Interval H&P Note (Signed)
History and Physical Interval Note:  28/05/8675 72:09 AM  Jamie Harding  has presented today for surgery, with the diagnosis of left carpal tunnel syndrome, left ring trigger finger  The various methods of treatment have been discussed with the patient and family. After consideration of risks, benefits and other options for treatment, the patient has consented to  Procedure(s) with comments: CARPAL TUNNEL RELEASE (Left) RELEASE TRIGGER FINGER/A-1 PULLEY (Left) - left ring finger as a surgical intervention .  The patient's history has been reviewed, patient examined, no change in status, stable for surgery.  I have reviewed the patient's chart and labs.  Questions were answered to the patient's satisfaction.     Arther Abbott

## 2015-09-28 NOTE — Brief Op Note (Signed)
09/28/2015  14:38 PM  PATIENT:  Jamie Harding  59 y.o. female  PRE-OPERATIVE DIAGNOSIS:  left carpal tunnel syndrome, left ring trigger finger  POST-OPERATIVE DIAGNOSIS:  left carpal tunnel syndrome, left ring trigger finger  PROCEDURE:  Procedure(s) with comments: LEFT CARPAL TUNNEL RELEASE (Left) - procedure 1 LEFT RING TRIGGER FINGER RELEASE (Left) - left ring finger  Findings: recurrent carpal tunnel stenosis and stenosing tenosynovitis left ring finger  SURGEON:  Surgeon(s) and Role:    * Carole Civil, MD - Primary  PHYSICIAN ASSISTANT:   ASSISTANTS: none   ANESTHESIA:   regional  EBL:  Total I/O In: 550 [I.V.:550] Out: 0   BLOOD ADMINISTERED:none  DRAINS: none   LOCAL MEDICATIONS USED:  MARCAINE     SPECIMEN:  No Specimen  DISPOSITION OF SPECIMEN:  N/A  COUNTS:  YES  TOURNIQUET:   Total Tourniquet Time Documented: Upper Arm (Left) - 43 minutes Total: Upper Arm (Left) - 43 minutes   DICTATION: .Viviann Spare Dictation  PLAN OF CARE: Discharge to home after PACU  PATIENT DISPOSITION:  PACU - hemodynamically stable.   Delay start of Pharmacological VTE agent (>24hrs) due to surgical blood loss or risk of bleeding: not applicable

## 2015-09-28 NOTE — Anesthesia Preprocedure Evaluation (Signed)
Anesthesia Evaluation  Patient identified by MRN, date of birth, ID band Patient awake    Reviewed: Allergy & Precautions, Patient's Chart, lab work & pertinent test results, Unable to perform ROS - Chart review only  History of Anesthesia Complications (+) PONVNegative for: history of anesthetic complications  Airway Mallampati: III  TM Distance: >3 FB Neck ROM: Full  Mouth opening: Limited Mouth Opening  Dental  (+) Missing,    Pulmonary shortness of breath and with exertion, asthma , COPD,  COPD inhaler, former smoker,    breath sounds clear to auscultation       Cardiovascular Exercise Tolerance: Poor hypertension, Pt. on medications Normal cardiovascular exam     Neuro/Psych PSYCHIATRIC DISORDERS Anxiety Depression  Neuromuscular disease    GI/Hepatic hiatal hernia, GERD  Medicated and Controlled,  Endo/Other  diabetes, Poorly Controlled, Type 2, Insulin Dependent, Oral Hypoglycemic Agents  Renal/GU      Musculoskeletal  (+) Arthritis , Osteoarthritis,    Abdominal (+) + obese,   Peds  Hematology   Anesthesia Other Findings   Reproductive/Obstetrics                             Anesthesia Physical Anesthesia Plan  ASA: III  Anesthesia Plan: Bier Block   Post-op Pain Management:    Induction:   Airway Management Planned: Nasal Cannula  Additional Equipment:   Intra-op Plan:   Post-operative Plan:   Informed Consent: I have reviewed the patients History and Physical, chart, labs and discussed the procedure including the risks, benefits and alternatives for the proposed anesthesia with the patient or authorized representative who has indicated his/her understanding and acceptance.   Dental advisory given  Plan Discussed with: CRNA  Anesthesia Plan Comments:         Anesthesia Quick Evaluation

## 2015-09-28 NOTE — Anesthesia Postprocedure Evaluation (Signed)
  Anesthesia Post-op Note  Patient: Jamie Harding  Procedure(s) Performed: Procedure(s) with comments: LEFT CARPAL TUNNEL RELEASE (Left) - procedure 1 LEFT RING TRIGGER FINGER RELEASE (Left) - left ring finger  Patient Location: PACU  Anesthesia Type:Bier block  Level of Consciousness: awake, alert , oriented and patient cooperative  Airway and Oxygen Therapy: Patient Spontanous Breathing and Patient connected to nasal cannula oxygen  Post-op Pain: mild  Post-op Assessment: Post-op Vital signs reviewed, Patient's Cardiovascular Status Stable, Respiratory Function Stable, Patent Airway, No signs of Nausea or vomiting and Pain level controlled              Post-op Vital Signs: Reviewed and stable  Last Vitals:  Filed Vitals:   09/28/15 1130  BP:   Temp:   Resp: 18    Complications: No apparent anesthesia complications

## 2015-09-28 NOTE — Transfer of Care (Signed)
Immediate Anesthesia Transfer of Care Note  Patient: Jamie Harding  Procedure(s) Performed: Procedure(s) with comments: LEFT CARPAL TUNNEL RELEASE (Left) - procedure 1 LEFT RING TRIGGER FINGER RELEASE (Left) - left ring finger  Patient Location: PACU  Anesthesia Type:Bier block  Level of Consciousness: awake, alert , oriented and patient cooperative  Airway & Oxygen Therapy: Patient Spontanous Breathing and Patient connected to nasal cannula oxygen  Post-op Assessment: Report given to RN and Post -op Vital signs reviewed and stable  Post vital signs: Reviewed and stable  Last Vitals:  Filed Vitals:   09/28/15 1130  BP:   Temp:   Resp: 18    Complications: No apparent anesthesia complications

## 2015-09-28 NOTE — Anesthesia Procedure Notes (Signed)
Procedure Name: MAC Date/Time: 09/28/2015 11:33 AM Performed by: Andree Elk, AMY A Pre-anesthesia Checklist: Patient identified, Timeout performed, Emergency Drugs available, Suction available and Patient being monitored Oxygen Delivery Method: Simple face mask

## 2015-09-28 NOTE — Telephone Encounter (Signed)
Pharmacist states there was an error in entering medication dose, it says 30 tabs by mouth every six hours. i gave correct order 1 every 6 hours. Pharmacist states patient filled same med dose last month and not due yet, patient advised him that Dr Aline Brochure told her to take two at a time. Advised pharmacist would let Dr Aline Brochure know. Pharmacy will not fill medication unless there is a dose change

## 2015-09-28 NOTE — Op Note (Signed)
09/28/2015  38:46 PM  PATIENT:  Jamie Harding  59 y.o. female  PRE-OPERATIVE DIAGNOSIS:  left carpal tunnel syndrome, left ring trigger finger  POST-OPERATIVE DIAGNOSIS:  left carpal tunnel syndrome, left ring trigger finger  PROCEDURE:  Procedure(s) with comments: LEFT CARPAL TUNNEL RELEASE (Left) - procedure 1 LEFT RING TRIGGER FINGER RELEASE (Left) - left ring finger  Findings: recurrent carpal tunnel stenosis and stenosing tenosynovitis left ring finger.  Details: The patient was identified in the preop holding area.  She was taken to surgery after site confirmation and site marking and chart review.  She had a Bier block  The previous incision the left carpal tunnel was opened with blunt dissection. We extended the incision slightly defined normal tissue planes and then used blunt dissection to open up the transverse carpal ligament the carpal tunnel was stenotic. The nerve was scarred. Irrigation was performed and the wound was closed with 3-0 nylon suture  We then made a longitudinal incision over the left ring finger used blunt dissection to find the A1 pulley and then released the A1 pulley  Interrupted nylon sutures were used to close the wound  We then injected Marcaine into each wound and placed a sterile bandage and released the tourniquet fingers were viable color was good capillary refill was excellent.  SURGEON:  Surgeon(s) and Role:    * Carole Civil, MD - Primary  PHYSICIAN ASSISTANT:   ASSISTANTS: none   ANESTHESIA:   regional  EBL:  Total I/O In: 550 [I.V.:550] Out: 0   BLOOD ADMINISTERED:none  DRAINS: none   LOCAL MEDICATIONS USED:  MARCAINE     SPECIMEN:  No Specimen  DISPOSITION OF SPECIMEN:  N/A  COUNTS:  YES  TOURNIQUET:   Total Tourniquet Time Documented: Upper Arm (Left) - 43 minutes Total: Upper Arm (Left) - 43 minutes   DICTATION: .Viviann Spare Dictation  PLAN OF CARE: Discharge to home after PACU  PATIENT DISPOSITION:   PACU - hemodynamically stable.   Delay start of Pharmacological VTE agent (>24hrs) due to surgical blood loss or risk of bleeding: not applicable

## 2015-09-28 NOTE — Telephone Encounter (Signed)
Voice message received from pharmacist Jonni Sanger at [facility] with question regarding medication prescribed by Dr Aline Brochure - pharmacy ph# [phone]

## 2015-09-29 ENCOUNTER — Encounter (HOSPITAL_COMMUNITY): Payer: Self-pay | Admitting: Orthopedic Surgery

## 2015-10-03 ENCOUNTER — Encounter: Payer: Self-pay | Admitting: Orthopedic Surgery

## 2015-10-03 ENCOUNTER — Ambulatory Visit (INDEPENDENT_AMBULATORY_CARE_PROVIDER_SITE_OTHER): Payer: 59 | Admitting: Orthopedic Surgery

## 2015-10-03 VITALS — BP 130/78 | Ht 62.0 in | Wt 232.0 lb

## 2015-10-03 DIAGNOSIS — Z5189 Encounter for other specified aftercare: Secondary | ICD-10-CM

## 2015-10-03 NOTE — Progress Notes (Signed)
Chief Complaint  Patient presents with  . Follow-up    post op 1 LEFT CARPAL TUNNEL RELEASE and trigger finger release , DOS 09/28/15, post op 2 RIGHT CARPAL TUNNEL RELEASE, RIGHT RING TRIGGER FINGER, DOS 09/14/15   Postop check #1 she is doing well incision looks clean we wrapped it with gauze and an Ace bandage she'll come back for suture removal on the 23rd and then we'll see her about 2 weeks after that in the meantime she will continue with active hand range of motion exercises in both hands

## 2015-10-03 NOTE — Patient Instructions (Addendum)
Nurse visit 10/12/15 nurse visit suture removal  Follow up with Dr Aline Brochure 10/25/15

## 2015-10-11 ENCOUNTER — Encounter: Payer: Self-pay | Admitting: Family Medicine

## 2015-10-11 ENCOUNTER — Ambulatory Visit (INDEPENDENT_AMBULATORY_CARE_PROVIDER_SITE_OTHER): Payer: 59 | Admitting: Family Medicine

## 2015-10-11 VITALS — BP 114/70 | Ht 62.0 in | Wt 235.0 lb

## 2015-10-11 DIAGNOSIS — G894 Chronic pain syndrome: Secondary | ICD-10-CM

## 2015-10-11 DIAGNOSIS — R309 Painful micturition, unspecified: Secondary | ICD-10-CM

## 2015-10-11 DIAGNOSIS — N3000 Acute cystitis without hematuria: Secondary | ICD-10-CM | POA: Diagnosis not present

## 2015-10-11 DIAGNOSIS — G2581 Restless legs syndrome: Secondary | ICD-10-CM

## 2015-10-11 LAB — POCT URINALYSIS DIPSTICK
PH UA: 5
Spec Grav, UA: >=1.03

## 2015-10-11 MED ORDER — HYDROCODONE-ACETAMINOPHEN 5-325 MG PO TABS: 30.0000 | ORAL_TABLET | Freq: Four times a day (QID) | ORAL | Status: DC | PRN

## 2015-10-11 MED ORDER — CEFPROZIL 500 MG PO TABS: ORAL_TABLET | ORAL | Status: DC

## 2015-10-11 NOTE — Progress Notes (Signed)
   Subjective:    Patient ID: Jamie Harding, female    DOB: [birth year], 59 y.o.   MRN: [id number]  Urinary Tract Infection  This is a new problem. The current episode started in the past 7 days. The quality of the pain is described as burning. There has been no fever. Pertinent negatives include no vomiting. Associated symptoms comments: dysuria. Treatments tried: Azo.   This patient was seen today for chronic pain  The medication list was reviewed and updated.   -Compliance with medication: yes, patient states taking as prescribed.  - Number patient states they take daily: patient states, takes 3 a day usually and sometimes takes 4 if pain is more severe.  -when was the last dose patient took? This morning.  The patient was advised the importance of maintaining medication and not using illegal substances with these.  Refills needed: yes  The patient was educated that we can provide 3 monthly scripts for their medication, it is their responsibility to follow the instructions.  Side effects or complications from medications: None  Patient is aware that pain medications are meant to minimize the severity of the pain to allow their pain levels to improve to allow for better function. They are aware of that pain medications cannot totally remove their pain.  Due for UDT ( at least once per year) : 03/30/2015  Patient also has c/o of burning and painful urination.  Patient having restless legs syndrome with restless legs happens at least 2-3 times every couple weeks bad enough to where she takes Xanax and seems to help she filled out a questionnaire which showed moderate restless legs  she does see the specialist for diabetes and that seems to be doing better  She recently had carpal tunnel release surgery   she does have chronic low back pain with intermittent sciatica she is currently taking gabapentin helps she uses hydrocodone anywhere from 3-4 times a day takes the edge off pain she  denies any drowsiness with it.     Review of Systems  Constitutional: Negative for activity change and appetite change.  Gastrointestinal: Negative for vomiting and abdominal pain.  Neurological: Negative for weakness.  Psychiatric/Behavioral: Negative for confusion.       Objective:   Physical Exam  Constitutional: She appears well-nourished. No distress.  HENT:  Head: Normocephalic.  Cardiovascular: Normal rate, regular rhythm and normal heart sounds.   No murmur heard. Pulmonary/Chest: Effort normal and breath sounds normal.  Musculoskeletal: She exhibits no edema.  Lymphadenopathy:    She has no cervical adenopathy.  Neurological: She is alert.  Psychiatric: Her behavior is normal.  Vitals reviewed.         Assessment & Plan:   urinary tract infection-antibiotics prescribed  diabetes followed by specialist  Chronic pain syndrome due to chronic back pain hydrocodone no more than 3 or 4 times per day caution drowsiness not to take at bedtime  Restless legs intermittently patient would prefer to use Xanax at bedtime does not limit take regular medication such as Requip. Per questionnaire she has moderate restless legs  Patient states anxiety depression stable  25 minutes was spent with the patient. Greater than half the time was spent in discussion and answering questions and counseling regarding the issues that the patient came in for today.

## 2015-10-12 ENCOUNTER — Ambulatory Visit (INDEPENDENT_AMBULATORY_CARE_PROVIDER_SITE_OTHER): Payer: 59 | Admitting: Orthopedic Surgery

## 2015-10-12 DIAGNOSIS — Z5189 Encounter for other specified aftercare: Secondary | ICD-10-CM

## 2015-10-13 LAB — URINE CULTURE

## 2015-10-17 NOTE — Progress Notes (Signed)
Sutures removed from left hand and wrist, patient tolerated well

## 2015-10-25 ENCOUNTER — Encounter: Payer: Self-pay | Admitting: Orthopedic Surgery

## 2015-10-25 ENCOUNTER — Encounter: Payer: Self-pay | Admitting: Family Medicine

## 2015-10-25 ENCOUNTER — Ambulatory Visit (INDEPENDENT_AMBULATORY_CARE_PROVIDER_SITE_OTHER): Payer: 59 | Admitting: Orthopedic Surgery

## 2015-10-25 VITALS — Ht 62.0 in | Wt 235.0 lb

## 2015-10-25 DIAGNOSIS — G5603 Carpal tunnel syndrome, bilateral upper limbs: Secondary | ICD-10-CM

## 2015-10-25 DIAGNOSIS — Z5189 Encounter for other specified aftercare: Secondary | ICD-10-CM

## 2015-10-25 MED ORDER — GABAPENTIN 300 MG PO CAPS: ORAL_CAPSULE | ORAL | Status: DC

## 2015-10-25 NOTE — Progress Notes (Signed)
Post op   Chief Complaint  Patient presents with  . Follow-up    post op, Left CTR + Left ring Trigger finger release, DOI 09/28/15        09/14/2015 procedure #1 right carpal tunnel release right ring finger trigger finger release: POD # 41  09/28/2015 procedure #2 left carpal tunnel release left ring trigger finger release: POD # 28  Start date out of work 09/14/15 (Note: 2nd procedure, date of surgery 09/28/15)  Continue out of work through 11/04/15, or until further notice, due to medical/surgical reasons.   Patient complaining of recurrence of symptoms with bilateral hand numbness worse at night occasionally off and on during the day and her dexterity is getting worse  Recommend increase gabapentin to 600 mg 3 times a day she's currently on 300 mg morning, afternoon and 600 at night  Placed her in carpal tunnel splints.  I work another 4 weeks follow-up another 4 weeks

## 2015-10-25 NOTE — Patient Instructions (Signed)
Out of work 4 weeks

## 2015-11-01 NOTE — Telephone Encounter (Signed)
We will send then additional medication. If ongoing troubles please call the office. Unfortunately I do not always remember to respond to these messages on a frequent basis. Whereas for urgent issues like this if you call the nurse they will discuss it with me and we will respond on same day thanks-Dr. Nicki Reaper we will send in refills regarding Teddy's breathing medication and also Cipro 500 one twice a day for 5 days

## 2015-11-02 MED ORDER — CIPROFLOXACIN HCL 500 MG PO TABS: ORAL_TABLET | ORAL | Status: DC

## 2015-11-02 NOTE — Addendum Note (Signed)
Addended by: Ofilia Neas R on: 11/02/2015 11:45 AM   Modules accepted: Orders

## 2015-11-03 ENCOUNTER — Ambulatory Visit: Payer: 59 | Admitting: *Deleted

## 2015-11-15 ENCOUNTER — Other Ambulatory Visit: Payer: Self-pay | Admitting: "Endocrinology

## 2015-11-16 LAB — BASIC METABOLIC PANEL
BUN: 10 mg/dL (ref 7–25)
CALCIUM: 9.5 mg/dL (ref 8.6–10.4)
CHLORIDE: 103 mmol/L (ref 98–110)
CO2: 27 mmol/L (ref 20–31)
Creat: 0.92 mg/dL (ref 0.50–1.05)
GLUCOSE: 143 mg/dL — AB (ref 65–99)
POTASSIUM: 4.1 mmol/L (ref 3.5–5.3)
SODIUM: 141 mmol/L (ref 135–146)

## 2015-11-16 LAB — HEMOGLOBIN A1C
HEMOGLOBIN A1C: 8.3 % — AB (ref <5.7)
MEAN PLASMA GLUCOSE: 192 mg/dL — AB (ref <117)

## 2015-11-16 LAB — T4, FREE: Free T4: 0.93 ng/dL (ref 0.80–1.80)

## 2015-11-16 LAB — TSH: TSH: 1.265 u[IU]/mL (ref 0.350–4.500)

## 2015-11-22 ENCOUNTER — Encounter: Payer: Self-pay | Admitting: Orthopedic Surgery

## 2015-11-22 ENCOUNTER — Ambulatory Visit: Payer: 59 | Admitting: "Endocrinology

## 2015-11-22 ENCOUNTER — Ambulatory Visit (INDEPENDENT_AMBULATORY_CARE_PROVIDER_SITE_OTHER): Payer: 59 | Admitting: Orthopedic Surgery

## 2015-11-22 VITALS — BP 137/83 | Ht 62.0 in | Wt 235.0 lb

## 2015-11-22 DIAGNOSIS — M6530 Trigger finger, unspecified finger: Secondary | ICD-10-CM

## 2015-11-22 DIAGNOSIS — Z5189 Encounter for other specified aftercare: Secondary | ICD-10-CM

## 2015-11-22 DIAGNOSIS — G5603 Carpal tunnel syndrome, bilateral upper limbs: Secondary | ICD-10-CM

## 2015-11-22 DIAGNOSIS — M65342 Trigger finger, left ring finger: Secondary | ICD-10-CM

## 2015-11-22 NOTE — Progress Notes (Signed)
Chief Complaint  Patient presents with  . Follow-up    follow up bilateral CTR, Rt 10/26, Lt 11/9   left ring trigger release and right ring trigger release  Out of work started 09/14/2015  Follow-up visit status post bilateral carpal tunnel releases and bilateral trigger finger releases with continuation of current symptoms including intermittent numbness and tingling at night. Postop week kept her on gabapentin which she takes and we put her back in splints. Her A1c is 8.3 her glucose runs 150-250  She has continued weakness in grip  Exam shows intact well-healed nonsensitive incisions over each carpal tunnel and each trigger finger release. There is no catching or locking related to the trigger fingers but she has a week grip  Recommend continue splinting at night. She can return to work in 2 weeks  Follow-up with me in 4 weeks.

## 2015-12-05 MED FILL — ADVAIR 250/50 DISKUS: 250-50 | 30 days supply | Qty: 60 | Fill #2

## 2015-12-05 MED FILL — NOVOLOG FLEXPEN SYRINGE: 100 | 63 days supply | Qty: 15 | Fill #1

## 2015-12-05 MED FILL — LISINOPRIL 20 MG TABLET: 20 | 90 days supply | Qty: 90 | Fill #1

## 2015-12-05 MED FILL — LANTUS SOLOSTAR 100 UNITS/M: 100 | 30 days supply | Qty: 15 | Fill #2

## 2015-12-05 MED FILL — metFORMIN HCL 1000 MG TABS: 1000 | 90 days supply | Qty: 180 | Fill #1

## 2015-12-05 MED FILL — ROSUVASTATIN CALCIUM 10 MG: 10 | 90 days supply | Qty: 90 | Fill #1

## 2015-12-05 MED FILL — PROMETHAZINE 25 MG TABLET: 25 | 10 days supply | Qty: 30 | Fill #2

## 2015-12-05 MED FILL — JANUVIA 100 MG TABLET: 100 | 90 days supply | Qty: 90 | Fill #2

## 2015-12-05 MED FILL — AMLODIPINE BESYLATE 10 MG T: 10 | 90 days supply | Qty: 90 | Fill #1

## 2015-12-05 MED FILL — VENTOLIN HFA 90 MCG INHALER: 108 (90 BAS | 25 days supply | Qty: 18 | Fill #5

## 2015-12-14 ENCOUNTER — Telehealth: Payer: Self-pay

## 2015-12-23 ENCOUNTER — Ambulatory Visit (INDEPENDENT_AMBULATORY_CARE_PROVIDER_SITE_OTHER): Payer: 59 | Admitting: Orthopedic Surgery

## 2015-12-23 VITALS — BP 163/90 | Ht 62.0 in | Wt 235.0 lb

## 2015-12-23 DIAGNOSIS — G5603 Carpal tunnel syndrome, bilateral upper limbs: Secondary | ICD-10-CM | POA: Diagnosis not present

## 2015-12-23 DIAGNOSIS — Z5189 Encounter for other specified aftercare: Secondary | ICD-10-CM

## 2015-12-23 NOTE — Progress Notes (Signed)
POST OP VISIT   Patient ID: Jamie Harding, female   DOB: [birth year], 60 y.o.   MRN: [id number]  Chief Complaint  Patient presents with  . Follow-up    1 month follow up, post op CTR LT 09/28/15, RT [id number]    HPI Jamie Harding is a 60 y.o. female.    S/P carpal tunnel releases bilaterally  86 days  postop the fingertips of both hands are still numb patient still having pain at the incision  Examination shows well-healed but tender incisions left hand tender thenar eminence trigger fingers that were released are also slightly tender but no locking or catching  Patient requiring brace wear at night to control symptoms as well.  We decided to try bilateral carpal tunnel injections  Procedure right carpal tunnel injection  Medications Depo-Medrol 43 mL 1% lidocaine  Procedure was done as follows verbal consent was obtained site confirmation by timeout.  Skin prepped with alcohol and ethyl chloride injection performed at the distal palmar crease in line with the radial border of the ring finger  No complications sterile bandage applied  Procedure was repeated identically on the right  Patient will follow up in 2 weeks to assess results of injection.

## 2016-01-09 ENCOUNTER — Ambulatory Visit (INDEPENDENT_AMBULATORY_CARE_PROVIDER_SITE_OTHER): Payer: 59 | Admitting: Orthopedic Surgery

## 2016-01-09 ENCOUNTER — Encounter: Payer: Self-pay | Admitting: Orthopedic Surgery

## 2016-01-09 VITALS — BP 168/89 | Ht 62.0 in | Wt 231.0 lb

## 2016-01-09 DIAGNOSIS — G5603 Carpal tunnel syndrome, bilateral upper limbs: Secondary | ICD-10-CM

## 2016-01-09 NOTE — Progress Notes (Signed)
Patient ID: Jamie Harding, female   DOB: March 11, 1956, 60 y.o.   MRN: [id number]  Chief Complaint  Patient presents with  . Follow-up    follow up bilateral carpal tunnel s/p injections    BP 168/89 mmHg  Ht 5\' 2"  (1.575 m)  Wt 231 lb (104.781 kg)  BMI 42.24 kg/m2  Follow-up        post op CTR LT 09/28/15, RT 09/14/15   Carpal tunnel release on the left 10/08/2015 Carpal tunnel release on the right 09/14/2015  Both surgeries were the second carpal tunnel releases had prior carpal tunnel release several years ago  She is not getting any real relief on the second round of surgeries and had injections and gabapentin   Injections resulted in improvement in overall pain at the injection sites. She still has decreased sensation from the distal interphalangeal joint distally she still dropping things but her overall pain improved     ASSESSMENT AND PLAN   Status post carpal tunnel release and status post bilateral carpal tunnel injection  Follow-up in one month to check on progress

## 2016-01-11 ENCOUNTER — Telehealth: Payer: Self-pay

## 2016-01-11 ENCOUNTER — Encounter: Payer: Self-pay | Admitting: Family Medicine

## 2016-01-11 ENCOUNTER — Ambulatory Visit (INDEPENDENT_AMBULATORY_CARE_PROVIDER_SITE_OTHER): Payer: 59 | Admitting: Family Medicine

## 2016-01-11 VITALS — BP 130/74 | Ht 62.0 in | Wt 231.0 lb

## 2016-01-11 DIAGNOSIS — M545 Low back pain: Secondary | ICD-10-CM

## 2016-01-11 DIAGNOSIS — M79604 Pain in right leg: Secondary | ICD-10-CM

## 2016-01-11 DIAGNOSIS — G894 Chronic pain syndrome: Secondary | ICD-10-CM | POA: Diagnosis not present

## 2016-01-11 MED ORDER — ALPRAZOLAM 0.5 MG PO TABS: 0.5000 mg | ORAL_TABLET | Freq: Two times a day (BID) | ORAL | Status: DC | PRN

## 2016-01-11 MED ORDER — HYDROCODONE-ACETAMINOPHEN 5-325 MG PO TABS: 1.0000 | ORAL_TABLET | Freq: Four times a day (QID) | ORAL | Status: DC | PRN

## 2016-01-11 MED FILL — VENTOLIN HFA 90 MCG INHALER: 108 (90 BAS | 25 days supply | Qty: 18 | Fill #6

## 2016-01-11 MED FILL — ADVAIR 250/50 DISKUS: 250-50 | 30 days supply | Qty: 60 | Fill #3

## 2016-01-11 NOTE — Progress Notes (Signed)
   Subjective:    Patient ID: Jamie Harding, female    DOB: 06-25-1956, 60 y.o.   MRN: [id number]  HPI This patient was seen today for chronic pain  The medication list was reviewed and updated.   -Compliance with medication: yes  - Number patient states they take daily: 4  -when was the last dose patient took? today  The patient was advised the importance of maintaining medication and not using illegal substances with these.  Refills needed: yes  The patient was educated that we can provide 3 monthly scripts for their medication, it is their responsibility to follow the instructions.  Side effects or complications from medications: none  Patient is aware that pain medications are meant to minimize the severity of the pain to allow their pain levels to improve to allow for better function. They are aware of that pain medications cannot totally remove their pain.  Due for UDT ( at least once per year) : utd   the patient is followed by her endocrinologist for her diabetes.  anxiety issues stable uses Xanax infrequently  does have some burning discomfort into the hands with carpal tunnel gabapentin she does not take 2 tablets 3 times daily cause a cause drowsiness I discussed with her the importance of trying to start low and gradually build up to find a dose that is best for her without causing drowsiness      Review of Systems  describes burning in the feet in at times numbness in addition to this having some intermittent numbness into the hands as well    Objective:   Physical Exam  Lungs clear heart regular pulse normal extremities no edema skin warm dry       Assessment & Plan:   patient is followed by endocrinology for her diabetes   chronic pain prescriptions given paced states medication does good job keeping things under control  Neuropathy may gradually increase gabapentin to help with the neuropathy in her hands from the carpal tunnel if it causes drowsiness  go back to the dose that she can tolerate  The patient was seen today as part of a comprehensive visit regarding pain control. Patient's compliance with the medication as well as discussion regarding effectiveness was completed. Prescriptions were written. Patient was advised to follow-up in 3 months. The patient was assessed for any signs of severe side effects. The patient was advised to take the medicine as directed and to report to [name] if any side effect issues.

## 2016-01-19 ENCOUNTER — Ambulatory Visit (INDEPENDENT_AMBULATORY_CARE_PROVIDER_SITE_OTHER): Payer: 59 | Admitting: Family Medicine

## 2016-01-19 ENCOUNTER — Encounter: Payer: Self-pay | Admitting: Family Medicine

## 2016-01-19 VITALS — BP 136/82 | Temp 98.2°F | Ht 62.0 in | Wt 227.2 lb

## 2016-01-19 DIAGNOSIS — J0100 Acute maxillary sinusitis, unspecified: Secondary | ICD-10-CM

## 2016-01-19 MED ORDER — CEFDINIR 300 MG PO CAPS: 300.0000 mg | ORAL_CAPSULE | Freq: Two times a day (BID) | ORAL | Status: DC

## 2016-01-19 NOTE — Progress Notes (Signed)
   Subjective:    Patient ID: Jamie Harding, female    DOB: 15-Feb-1956, 60 y.o.   MRN: [id number]  Cough This is a new problem. The current episode started in the past 7 days. Associated symptoms include ear pain, a fever, headaches, rhinorrhea, a sore throat and wheezing. Treatments tried: Robitussin Multi symptom. The treatment provided mild relief.   Patient states no other concerns this visit. Started with tight ness in thre chest   Got robitussin helped some  Throat cong and headache and runny throat  Wheezing a bit, with movement   noguniness tmax of 101   Review of Systems  Constitutional: Positive for fever.  HENT: Positive for ear pain, rhinorrhea and sore throat.   Respiratory: Positive for cough and wheezing.   Neurological: Positive for headaches.       Objective:   Physical Exam  Alert, mild malaise. Hydration good Vitals stable. frontal/ maxillary tenderness evident positive nasal congestion. pharynx normal neck supple  Lungs  Very mild expiratory wheeze otherwise clear/no crackles or wheezes. heart regular in rhythm       Assessment & Plan:  right Impression post viral bronchitis/ rhinosinusitis likely post viral, discussed with patient. plan antibiotics prescribed. Questions answered. Symptomatic care discussed. warning signs discussed. WSL

## 2016-01-25 DIAGNOSIS — H524 Presbyopia: Secondary | ICD-10-CM | POA: Diagnosis not present

## 2016-01-25 DIAGNOSIS — H5203 Hypermetropia, bilateral: Secondary | ICD-10-CM | POA: Diagnosis not present

## 2016-01-25 DIAGNOSIS — H52223 Regular astigmatism, bilateral: Secondary | ICD-10-CM | POA: Diagnosis not present

## 2016-02-02 ENCOUNTER — Telehealth: Payer: Self-pay | Admitting: Family Medicine

## 2016-02-02 ENCOUNTER — Encounter: Payer: Self-pay | Admitting: Family Medicine

## 2016-02-02 ENCOUNTER — Ambulatory Visit (INDEPENDENT_AMBULATORY_CARE_PROVIDER_SITE_OTHER): Payer: 59 | Admitting: Family Medicine

## 2016-02-02 VITALS — BP 150/90 | Temp 97.9°F | Ht 62.0 in | Wt 232.2 lb

## 2016-02-02 DIAGNOSIS — J111 Influenza due to unidentified influenza virus with other respiratory manifestations: Secondary | ICD-10-CM

## 2016-02-02 DIAGNOSIS — J019 Acute sinusitis, unspecified: Secondary | ICD-10-CM | POA: Diagnosis not present

## 2016-02-02 MED ORDER — DOXYCYCLINE HYCLATE 100 MG PO CAPS: 100.0000 mg | ORAL_CAPSULE | Freq: Two times a day (BID) | ORAL | Status: DC

## 2016-02-02 MED ORDER — BENZONATATE 100 MG PO CAPS: 100.0000 mg | ORAL_CAPSULE | Freq: Three times a day (TID) | ORAL | Status: DC | PRN

## 2016-02-02 MED ORDER — OSELTAMIVIR PHOSPHATE 75 MG PO CAPS: 75.0000 mg | ORAL_CAPSULE | Freq: Two times a day (BID) | ORAL | Status: DC

## 2016-02-02 NOTE — Telephone Encounter (Signed)
Patient wants to know if she can have something for a cough called in.  She was seen today for the flu.   [place]

## 2016-02-02 NOTE — Telephone Encounter (Signed)
Left message on voicemail notifying patient med was sent to pharmacy.

## 2016-02-02 NOTE — Progress Notes (Signed)
   Subjective:    Patient ID: Jamie Harding, female    DOB: September 23, 1956, 60 y.o.   MRN: [id number]  Cough This is a recurrent problem. The current episode started 1 to 4 weeks ago. The cough is productive of sputum. Associated symptoms include a fever, rhinorrhea and a sore throat. Pertinent negatives include no chest pain, ear pain, shortness of breath or wheezing. Associated symptoms comments: Max fever 102. Treatments tried: Omincef.  Patient significant onset fever body aches not feeling good over the past day sinus symptoms over the past couple weeks Patient states no other concerns this visit.  Review of Systems  Constitutional: Positive for fever. Negative for activity change.  HENT: Positive for congestion, rhinorrhea and sore throat. Negative for ear pain.   Eyes: Negative for discharge.  Respiratory: Positive for cough. Negative for shortness of breath and wheezing.   Cardiovascular: Negative for chest pain.       Objective:   Physical Exam  Constitutional: She appears well-developed.  HENT:  Head: Normocephalic.  Nose: Nose normal.  Mouth/Throat: Oropharynx is clear and moist. No oropharyngeal exudate.  Neck: Neck supple.  Cardiovascular: Normal rate and normal heart sounds.   No murmur heard. Pulmonary/Chest: Effort normal and breath sounds normal. She has no wheezes.  Lymphadenopathy:    She has no cervical adenopathy.  Skin: Skin is warm and dry.  Nursing note and vitals reviewed.   I chose to put the patient on Tamiflu and doxycycline. I believe there is compelling case that she has secondary sinusitis but she also has contracted the flu from the hospital over the past 24-48 hours warning signs were discussed in detail      Assessment & Plan:  Influenza-the patient was diagnosed with influenza. Patient/family educated about the flu and warning signs to watch for. If difficulty breathing, severe neck pain and stiffness, cyanosis, disorientation, or progressive  worsening then immediately get rechecked at that ER. If progressive symptoms be certain to be rechecked. Supportive measures such as Tylenol/ibuprofen was discussed. No aspirin use in children. And influenza home care instruction sheet was given. Patient was seen today for upper respiratory illness. It is felt that the patient is dealing with sinusitis. Antibiotics were prescribed today. Importance of compliance with medication was discussed. Symptoms should gradually resolve over the course of the next several days. If high fevers, progressive illness, difficulty breathing, worsening condition or failure for symptoms to improve over the next several days then the patient is to follow-up. If any emergent conditions the patient is to follow-up in the emergency department otherwise to follow-up in the office.

## 2016-02-02 NOTE — Patient Instructions (Signed)

## 2016-02-02 NOTE — Telephone Encounter (Signed)
Tessalon 100 mg, 1 3 times a day when necessary cough, #30, one refill-patient already on hydrocodone pain medication therefore cannot do Hycodan

## 2016-02-06 ENCOUNTER — Encounter: Payer: Self-pay | Admitting: Internal Medicine

## 2016-02-06 ENCOUNTER — Encounter: Payer: Self-pay | Admitting: Family Medicine

## 2016-02-06 ENCOUNTER — Ambulatory Visit: Payer: 59 | Admitting: Orthopedic Surgery

## 2016-02-07 ENCOUNTER — Ambulatory Visit (INDEPENDENT_AMBULATORY_CARE_PROVIDER_SITE_OTHER): Payer: 59 | Admitting: Family Medicine

## 2016-02-07 ENCOUNTER — Encounter: Payer: Self-pay | Admitting: Family Medicine

## 2016-02-07 VITALS — BP 120/82 | Temp 99.0°F | Ht 62.0 in | Wt 230.8 lb

## 2016-02-07 DIAGNOSIS — J019 Acute sinusitis, unspecified: Secondary | ICD-10-CM

## 2016-02-07 DIAGNOSIS — J111 Influenza due to unidentified influenza virus with other respiratory manifestations: Secondary | ICD-10-CM

## 2016-02-07 DIAGNOSIS — B9689 Other specified bacterial agents as the cause of diseases classified elsewhere: Secondary | ICD-10-CM

## 2016-02-07 MED ORDER — CEFPROZIL 500 MG PO TABS: 500.0000 mg | ORAL_TABLET | Freq: Two times a day (BID) | ORAL | Status: DC

## 2016-02-07 NOTE — Progress Notes (Signed)
   Subjective:    Patient ID: Jamie Harding, female    DOB: 07-18-1956, 60 y.o.   MRN: [id number]  Cough This is a new problem. The current episode started 1 to 4 weeks ago. Associated symptoms include a fever, headaches and wheezing. Treatments tried: tamiflu and doxycycline.   Patient has underlying diabetes. She is had a recent case of the flu along with a sinus infection was on doxycycline sinus infection not getting better still having a lot of head congestion drainage and coughing. Therefore patient came in today for recheck. Unable to go to work because it is severe fatigue and ongoing fevers.   Review of Systems  Constitutional: Positive for fever.  Respiratory: Positive for cough and wheezing.   Neurological: Positive for headaches.       Objective:   Physical Exam Eardrums normal throat normal Moderate sinus tenderness lungs are clear hearts regular pulse normal      Assessment & Plan:  Recent influenza with severe fatigue and tiredness ongoing fevers patient unable to go back to work. It would not be wise for her or for her patients Secondary rhinosinusitis stop doxycycline switch to Cefzil twice a day 10 days If progressive troubles may need x-rays lab work patient to let us know. Hopefully able to go back to work next week

## 2016-02-08 ENCOUNTER — Other Ambulatory Visit: Payer: Self-pay | Admitting: *Deleted

## 2016-02-08 MED ORDER — VENLAFAXINE HCL 75 MG PO TABS: 75.0000 mg | ORAL_TABLET | Freq: Two times a day (BID) | ORAL | Status: DC

## 2016-02-08 MED FILL — VENLAFAXINE HCL 75 MG TAB: 75 | 90 days supply | Qty: 180 | Fill #0

## 2016-02-10 MED FILL — METHOCARBAMOL 500 MG TABLET: 500 | 30 days supply | Qty: 60 | Fill #1

## 2016-02-10 MED FILL — ONE TOUCH ULTRA TEST STRIPS: 50 days supply | Qty: 200 | Fill #2

## 2016-02-10 MED FILL — NOVOLOG FLEXPEN SYRINGE: 100 | 63 days supply | Qty: 15 | Fill #2

## 2016-02-10 MED FILL — VENTOLIN HFA 90 MCG INHALER: 108 (90 BAS | 25 days supply | Qty: 18 | Fill #7

## 2016-02-10 MED FILL — ADVAIR 250/50 DISKUS: 250-50 | 30 days supply | Qty: 60 | Fill #4

## 2016-02-17 ENCOUNTER — Other Ambulatory Visit: Payer: Self-pay | Admitting: Family Medicine

## 2016-02-17 MED FILL — PANTOPRAZOLE SOD DR 40 MG T: 40 | 90 days supply | Qty: 180 | Fill #0

## 2016-02-17 MED FILL — ONDANSETRON HCL 8 MG TABLET: 8 | 10 days supply | Qty: 20 | Fill #2

## 2016-02-17 MED FILL — MELOXICAM 15 MG TABLET: 15 | 90 days supply | Qty: 90 | Fill #0

## 2016-02-17 MED FILL — PROMETHAZINE 25 MG TABLET: 25 | 10 days supply | Qty: 30 | Fill #0

## 2016-03-01 ENCOUNTER — Encounter: Payer: Self-pay | Admitting: Family Medicine

## 2016-03-13 ENCOUNTER — Telehealth: Payer: Self-pay

## 2016-03-21 ENCOUNTER — Other Ambulatory Visit: Payer: Self-pay | Admitting: *Deleted

## 2016-03-21 MED ORDER — INSULIN PEN NEEDLE 31G X 8 MM MISC: Status: DC

## 2016-03-21 MED FILL — ADVAIR 250/50 DISKUS: 250-50 | 30 days supply | Qty: 60 | Fill #5

## 2016-03-21 MED FILL — VENTOLIN HFA 90 MCG INHALER: 108 (90 BAS | 25 days supply | Qty: 18 | Fill #8

## 2016-03-21 MED FILL — UNIFINE PENTIPS 8MM 31G: 31G X 8 MM | 90 days supply | Qty: 300 | Fill #0

## 2016-03-21 MED FILL — ROSUVASTATIN CALCIUM 10 MG: 10 | 90 days supply | Qty: 90 | Fill #2

## 2016-03-21 MED FILL — ONE TOUCH ULTRA TEST STRIPS: 50 days supply | Qty: 200 | Fill #3

## 2016-03-21 MED FILL — AMLODIPINE BESYLATE 10 MG T: 10 | 90 days supply | Qty: 90 | Fill #2

## 2016-03-21 MED FILL — metFORMIN HCL 1000 MG TABS: 1000 | 90 days supply | Qty: 180 | Fill #2

## 2016-03-21 MED FILL — JANUVIA 100 MG TABLET: 100 | 90 days supply | Qty: 90 | Fill #3

## 2016-03-28 ENCOUNTER — Ambulatory Visit (INDEPENDENT_AMBULATORY_CARE_PROVIDER_SITE_OTHER): Payer: 59 | Admitting: Cardiology

## 2016-03-28 ENCOUNTER — Encounter: Payer: Self-pay | Admitting: Cardiology

## 2016-03-28 VITALS — BP 174/92 | HR 72 | Wt 234.0 lb

## 2016-03-28 DIAGNOSIS — I10 Essential (primary) hypertension: Secondary | ICD-10-CM | POA: Diagnosis not present

## 2016-03-28 DIAGNOSIS — E785 Hyperlipidemia, unspecified: Secondary | ICD-10-CM | POA: Diagnosis not present

## 2016-03-28 DIAGNOSIS — R0609 Other forms of dyspnea: Secondary | ICD-10-CM

## 2016-03-28 MED ORDER — LISINOPRIL 20 MG PO TABS: 30.0000 mg | ORAL_TABLET | Freq: Every day | ORAL | Status: DC

## 2016-03-28 MED FILL — LISINOPRIL 20 MG TABLET: 20 | 90 days supply | Qty: 135 | Fill #0

## 2016-03-28 NOTE — Patient Instructions (Signed)
Medication Instructions:  INCREASE LISINOPRIL TO 30 MG DAILY ( 1 & 1/2 TABLETS DAILY)   Labwork: NONE  Testing/Procedures: NONE  Follow-Up: Your physician wants you to follow-up in: 1 YEAR .  You will receive a reminder letter in the mail two months in advance. If you don't receive a letter, please call our office to schedule the follow-up appointment.   Any Other Special Instructions Will Be Listed Below (If Applicable).     If you need a refill on your cardiac medications before your next appointment, please call your pharmacy.

## 2016-03-28 NOTE — Progress Notes (Signed)
Cardiology Office Note  Date: 0/93/2355   ID: Jamie Harding, DOB [birth year], MRN [id number]  PCP: [name], MD  Evaluating Cardiologist: Rozann Lesches, MD   Chief Complaint  Patient presents with  . Cardiac follow-up    History of Present Illness: Jamie Harding is a 60 y.o. female former patient of Dr. Ron Parker, now establishing follow-up with me. This is our first meeting in the office. I reviewed records and updated the chart. She last saw Dr. Ron Parker in March 2016. 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 She reports fairly chronic history on exertion, also has had some left upper chest and shoulder discomfort that sounds more musculoskeletal based on her description today.  Prior cardiac testing has been reassuring including echocardiogram and cardiac catheterization from last year, results detailed below. I reviewed these with her today as well as the preceding Cardiolite study.  We went over her medications. She reports compliance. Blood pressure was elevated today, and she states that it has been elevated recently. We discussed increasing her lisinopril further. She will be seeing Dr. Wolfgang Phoenix back later this month.  Past Medical History  Diagnosis Date  . Type 2 diabetes mellitus ([place])   . Essential hypertension   . Obesity   . Depression   . Anxiety   . HLD (hyperlipidemia)   . Hiatal hernia   . Asthma   . GERD (gastroesophageal reflux disease)   . Fatty liver   . Gastritis   . Pancreatitis   . Tibialis tendinitis   . History of cardiac catheterization     Minimal coronary atherosclerosis February 2016  . Gastroparesis   . Rheumatoid arthritis(714.0)   . History of stroke 2008  . Neuropathy ([place])   . History of kidney stones     Past Surgical History  Procedure Laterality Date  . Cesarean section    . Cholecystectomy    . Ankle surgery      remove extra bone-left  . Finger surgery Left     left little finger-otif of finger  . Lithotripsy     . Dilation and curettage of uterus      multiple  . Laser ablation of the cervix    . Carpal tunnel release      right side  . Tubal ligation    . Gastric emptying  07/27/2009    The amount of activity in the stomach at 120 minutes was 13% which is in the normal range  . Esophagogastroduodenoscopy  07/29/2009    Mild gastritis, benign path  . Colonoscopy with propofol  09/16/2012    SEG:BTDV sessile polyps ranging between 3-43m in size were found in the sigmoid colon and rectum; polypectomy was performed/Mild diverticulosis was noted throughout the entire examined colon/Small internal hemorrhoids  . Esophagogastroduodenoscopy (egd) with propofol  09/16/2012    SLF:Non-erosive gastritis (inflammation) was found; multiple bx/The duodenal mucosa showed no abnormalities in the ampulla and bulb and second portion of the duodenum/NAUSEA/VOMITING MOST LIKELY DUE TO GERD/GASTRITIS  . Savory dilation  09/16/2012    Procedure: SAVORY DILATION;  Surgeon: SDanie Binder MD;  Location: [facility];  Service: Endoscopy;  Laterality: N/A;  16 fr dilation  . Polypectomy  09/16/2012    Procedure: POLYPECTOMY;  Surgeon: SDanie Binder MD;  Location: [facility];  Service: Endoscopy;  Laterality: N/A;  . Carpal tunnel release Left 02/06/2013    Procedure: CARPAL TUNNEL RELEASE ;  Surgeon: SCarole Civil MD;  Location:  [facility];  Service: Orthopedics;  Laterality: Left;  procedure end 1135  . Trigger finger release Left 02/06/2013    Procedure: RELEASE TRIGGER FINGER LEFT LONG FINGER/A-1 PULLEY;  Surgeon: Carole Civil, MD;  Location: [facility];  Service: Orthopedics;  Laterality: Left;  procedure began 1136  . Left and right heart catheterization with coronary angiogram N/A 01/03/2015    Procedure: LEFT AND RIGHT HEART CATHETERIZATION WITH CORONARY ANGIOGRAM;  Surgeon: Blane Ohara, MD;  Location: [facility] CATH LAB;  Service: Cardiovascular;  Laterality: N/A;  . Carpal tunnel release Right 09/14/2015    Procedure:  RIGHT CARPAL TUNNEL RELEASE;  Surgeon: Carole Civil, MD;  Location: [facility];  Service: Orthopedics;  Laterality: Right;  . Trigger finger release Right 09/14/2015    Procedure: RIGHT LONG FINGER TRIGGER RELEASE;  Surgeon: Carole Civil, MD;  Location: [facility];  Service: Orthopedics;  Laterality: Right;  . Carpal tunnel release Left 09/28/2015    Procedure: LEFT CARPAL TUNNEL RELEASE;  Surgeon: Carole Civil, MD;  Location: [facility];  Service: Orthopedics;  Laterality: Left;  procedure 1  . Trigger finger release Left 09/28/2015    Procedure: LEFT RING TRIGGER FINGER RELEASE;  Surgeon: Carole Civil, MD;  Location: [facility];  Service: Orthopedics;  Laterality: Left;  left ring finger    Current Outpatient Prescriptions  Medication Sig Dispense Refill  . ADVAIR DISKUS 250-50 MCG/DOSE AEPB INHALE 1 PUFF BY MOUTH 2 TIMES DAILY. 60 each 5  . ALPRAZolam (XANAX) 0.5 MG tablet Take 1 tablet (0.5 mg total) by mouth 2 (two) times daily as needed. for anxiety 60 tablet 5  . amLODipine (NORVASC) 10 MG tablet Take 1 tablet (10 mg total) by mouth daily. 90 tablet 3  . aspirin EC 81 MG tablet Take 81 mg by mouth daily.    . benzonatate (TESSALON) 100 MG capsule Take 1 capsule (100 mg total) by mouth 3 (three) times daily as needed for cough. 30 capsule 1  . Blood Glucose Monitoring Suppl (ONE TOUCH ULTRA SYSTEM KIT) W/DEVICE KIT 1 kit by Does not apply route QID. Use as directed 4 x daily 1 each 0  . cefPROZIL (CEFZIL) 500 MG tablet Take 1 tablet (500 mg total) by mouth 2 (two) times daily. 20 tablet 0  . cetirizine (ZYRTEC) 10 MG tablet Take 1 tablet (10 mg total) by mouth daily. 30 tablet 6  . cyanocobalamin 1000 MCG tablet Take 1,000 mcg by mouth daily. Reported on 02/02/2016    . Ergocalciferol (VITAMIN D2) 2000 UNITS TABS Take by mouth daily.    . fluticasone (FLONASE) 50 MCG/ACT nasal spray Place 2 sprays into both nostrils daily. 48 g 3  . furosemide (LASIX) 20 MG tablet Take 1 tablet (20  mg total) by mouth daily. 90 tablet 3  . gabapentin (NEURONTIN) 300 MG capsule Two caps by mouth three times daily 180 capsule 0  . glucose blood test strip Use as instructed 4 x daily One touch Ultra test strips 150 each 11  . HYDROcodone-acetaminophen (NORCO/VICODIN) 5-325 MG tablet Take 1 tablet by mouth every 6 (six) hours as needed for moderate pain. 120 tablet 0  . Insulin Glargine (LANTUS SOLOSTAR) 100 UNIT/ML Solostar Pen Inject 50 Units into the skin daily at 10 pm. 5 pen 2  . Insulin Pen Needle (UNIFINE PENTIPS) 31G X 8 MM MISC USE AS DIRECTED THREE TIMES A DAY AS NEEDED 300 each 5  . lisinopril (PRINIVIL,ZESTRIL) 20 MG tablet Take 1.5 tablets (30 mg  total) by mouth daily. 135 tablet 3  . meloxicam (MOBIC) 15 MG tablet TAKE 1 TABLET BY MOUTH ONCE DAILY 90 tablet 0  . metFORMIN (GLUCOPHAGE) 1000 MG tablet Take 1 tablet (1,000 mg total) by mouth 2 (two) times daily. 180 tablet 3  . methocarbamol (ROBAXIN) 500 MG tablet Take 1 tablet (500 mg total) by mouth 2 (two) times daily as needed for muscle spasms. 60 tablet 5  . Multiple Vitamin (MULTIVITAMIN WITH MINERALS) TABS Take 1 tablet by mouth daily.    Marland Kitchen NOVOLOG FLEXPEN 100 UNIT/ML FlexPen Inject 8 Units into the skin 3 (three) times daily with meals. 15 mL 2  . ondansetron (ZOFRAN) 8 MG tablet TAKE 1 TABLET BY MOUTH EVERY 12 HOURS AS NEEDED FOR NAUSEA 20 tablet 2  . oseltamivir (TAMIFLU) 75 MG capsule Take 1 capsule (75 mg total) by mouth 2 (two) times daily. 10 capsule 0  . pantoprazole (PROTONIX) 40 MG tablet Take 1 tablet (40 mg total) by mouth 2 (two) times daily. 180 tablet 3  . promethazine (PHENERGAN) 25 MG tablet TAKE 1 TABLET BY MOUTH EVERY 8 HOURS AS NEEDED FOR NAUSEA. 30 tablet 2  . pyridOXINE (VITAMIN B-6) 100 MG tablet Take 100 mg by mouth daily. Reported on 02/02/2016    . rosuvastatin (CRESTOR) 10 MG tablet Take 1 tablet (10 mg total) by mouth daily. 90 tablet 3  . venlafaxine (EFFEXOR) 75 MG tablet Take 1 tablet (75 mg total)  by mouth 2 (two) times daily with a meal. 180 tablet 1  . VENTOLIN HFA 108 (90 BASE) MCG/ACT inhaler INHALE 2 PUFFS INTO THE LUNGS EVERY 4 (FOUR) HOURS AS NEEDED FOR WHEEZING. 54 g 5   No current facility-administered medications for this visit.   Allergies:  Byetta 10 mcg pen; Naproxen; Augmentin; Azithromycin; Erythromycin; Invokana; Morphine; and Sulfonamide derivatives   Social History: The patient  reports that she quit smoking about 28 years ago. Her smoking use included Cigarettes. She has a 1.5 pack-year smoking history. She does not have any smokeless tobacco history on file. She reports that she does not drink alcohol or use illicit drugs.   ROS:  Please see the history of present illness. Otherwise, complete review of systems is positive for intermittent left shoulder discomfort.  All other systems are reviewed and negative.   Physical Exam: VS:  BP 174/92 mmHg  Pulse 72  Wt 234 lb (106.142 kg)  SpO2 98%, BMI Body mass index is 42.79 kg/(m^2).  Wt Readings from Last 3 Encounters:  03/28/16 234 lb (106.142 kg)  02/07/16 230 lb 12.8 oz (104.69 kg)  02/02/16 232 lb 4 oz (105.348 kg)    General: Obese woman, appears comfortable at rest. HEENT: Conjunctiva and lids normal, oropharynx clear. Neck: Supple, no elevated JVP or carotid bruits, no thyromegaly. Lungs: Clear to auscultation, nonlabored breathing at rest. Cardiac: Regular rate and rhythm, S4, no significant systolic murmur, no pericardial rub. Abdomen: Soft, nontender, bowel sounds present. Extremities: No pitting edema, distal pulses 2+. Skin: Warm and dry. Musculoskeletal: No kyphosis. Neuropsychiatric: Alert and oriented x3, affect grossly appropriate.  ECG: I personally reviewed the prior tracing from 01/03/2015 which showed normal sinus rhythm.  Recent Labwork: 06/30/2015: ALT 17; AST 18 09/08/2015: Hemoglobin 14.7; Platelets 262 11/15/2015: BUN 10; Creat 0.92; Potassium 4.1; Sodium 141; TSH 1.265       Component Value Date/Time   CHOL 175 06/30/2015 1000   CHOL 156 12/09/2014 0940   TRIG 115 06/30/2015 1000   HDL 53 06/30/2015  1000   HDL 43 12/09/2014 0940   CHOLHDL 3.3 06/30/2015 1000   CHOLHDL 3.6 12/09/2014 0940   VLDL 19 12/09/2014 0940   LDLCALC 99 06/30/2015 1000   LDLCALC 94 12/09/2014 0940    Other Studies Reviewed Today:  Cardiac catheterization 01/03/2015: Procedural Findings: Hemodynamics RA 8 RV 34/8 PA 25/8 mean 14 PCWP 10 (a-wave 14, v-wave 12) LV 146/11 AO 142/69 mean 99  Oxygen saturations: PA 77 AO 99 SVC 78  Cardiac Output (Fick) 6.7 L/M  Cardiac Index (Fick) 3.3 L/M/sqare meter  Coronary angiography: Coronary dominance: right  Left mainstem: The left main is mildly calcified. The distal left main has mild irregularity with 20% stenosis. The left main divides into the LAD and left circumflex.  Left anterior descending (LAD): The LAD is mildly calcified. The vessel is patent throughout. After the first diagonal branch, the LAD is small in caliber. There are mild irregularities but no significant stenoses identified.  Left circumflex (LCx): The left circumflex covers a relatively small area of myocardium. The first obtuse marginal is medium in caliber without significant stenosis. There is some irregularity in that vessel. The AV circumflex beyond the OM has 2 very small subbranch is without significant stenoses.  Right coronary artery (RCA): The right coronary artery is dominant. The vessel is large in caliber. There are some mild irregularities in the mid vessel. The PDA and PLA branches are patent without significant stenoses.  Left ventriculography: Left ventricular systolic function is vigorous, LVEF is estimated at 65-70%, there is no significant mitral regurgitation   Estimated Blood Loss: Minimal  Final Conclusions:  1. Normal intracardiac hemodynamics with normal pulmonary artery pressure and normal cardiac output 2. Patent coronary  arteries with evidence of nonobstructive atherosclerosis, including luminal irregularities and vessel calcification. There is no obstructive disease identified. 3. Vigorous left ventricular systolic function with normal LVEDP  Echocardiogram 12/31/2014: Study Conclusions  - Left ventricle: The cavity size was normal. Wall thickness was normal. Systolic function was normal. The estimated ejection fraction was in the range of 55% to 65%. Wall motion was normal; there were no regional wall motion abnormalities. Features are consistent with a pseudonormal left ventricular filling pattern, with concomitant abnormal relaxation and increased filling pressure (grade 2 diastolic dysfunction). - Left atrium: The atrium was mildly dilated.  Assessment and Plan:  1. Chronic dyspnea on exertion and atypical chest pain. She had a reassuring cardiac workup last year including normal right heart hemodynamics and only mild coronary atherosclerosis/luminal irregularities. Obesity is likely contributor as is potentially diastolic dysfunction with hypertension. I recommended that she increase her lisinopril to 30 mg daily. Keep follow-up with Dr. Wolfgang Phoenix later this month.  2. Essential hypertension, blood pressure elevated today. Lisinopril is being increased. She will be seeing Dr. Wolfgang Phoenix later this month for lab work.  3. Hyperlipidemia, on Crestor. Last LDL was 99.  Current medicines were reviewed with the patient today.   Orders Placed This Encounter  Procedures  . EKG 12-Lead    Disposition: FU with me in 1 year.   Signed, Satira Sark, MD, [facility] 03/28/2016 11:34 AM    [place] [facility] [place]. [address] [place], [place] [address] Phone: [phone]; Fax: [phone]

## 2016-04-09 ENCOUNTER — Ambulatory Visit (INDEPENDENT_AMBULATORY_CARE_PROVIDER_SITE_OTHER): Payer: 59 | Admitting: Family Medicine

## 2016-04-09 ENCOUNTER — Encounter: Payer: Self-pay | Admitting: Family Medicine

## 2016-04-09 VITALS — BP 124/80 | Ht 62.0 in | Wt 236.0 lb

## 2016-04-09 DIAGNOSIS — M545 Low back pain: Secondary | ICD-10-CM

## 2016-04-09 DIAGNOSIS — I10 Essential (primary) hypertension: Secondary | ICD-10-CM

## 2016-04-09 DIAGNOSIS — E1159 Type 2 diabetes mellitus with other circulatory complications: Secondary | ICD-10-CM | POA: Diagnosis not present

## 2016-04-09 DIAGNOSIS — M79605 Pain in left leg: Secondary | ICD-10-CM

## 2016-04-09 MED ORDER — INSULIN PEN NEEDLE 31G X 8 MM MISC: Status: DC

## 2016-04-09 MED ORDER — ALBUTEROL SULFATE HFA 108 (90 BASE) MCG/ACT IN AERS: INHALATION_SPRAY | RESPIRATORY_TRACT | Status: DC

## 2016-04-09 MED ORDER — HYDROCODONE-ACETAMINOPHEN 5-325 MG PO TABS: 1.0000 | ORAL_TABLET | Freq: Four times a day (QID) | ORAL | Status: DC | PRN

## 2016-04-09 MED ORDER — PANTOPRAZOLE SODIUM 40 MG PO TBEC: 40.0000 mg | DELAYED_RELEASE_TABLET | Freq: Two times a day (BID) | ORAL | Status: DC

## 2016-04-09 MED ORDER — METFORMIN HCL 1000 MG PO TABS: 1000.0000 mg | ORAL_TABLET | Freq: Two times a day (BID) | ORAL | Status: DC

## 2016-04-09 MED ORDER — CETIRIZINE HCL 10 MG PO TABS: 10.0000 mg | ORAL_TABLET | Freq: Every day | ORAL | Status: DC

## 2016-04-09 MED ORDER — FLUTICASONE-SALMETEROL 250-50 MCG/DOSE IN AEPB: INHALATION_SPRAY | RESPIRATORY_TRACT | Status: DC

## 2016-04-09 MED ORDER — FLUTICASONE PROPIONATE 50 MCG/ACT NA SUSP: 2.0000 | Freq: Every day | NASAL | Status: DC

## 2016-04-09 MED ORDER — FUROSEMIDE 20 MG PO TABS: 20.0000 mg | ORAL_TABLET | Freq: Every day | ORAL | Status: DC

## 2016-04-09 MED ORDER — VENLAFAXINE HCL 75 MG PO TABS: 75.0000 mg | ORAL_TABLET | Freq: Two times a day (BID) | ORAL | Status: DC

## 2016-04-09 MED ORDER — ROSUVASTATIN CALCIUM 10 MG PO TABS: 10.0000 mg | ORAL_TABLET | Freq: Every day | ORAL | Status: DC

## 2016-04-09 MED ORDER — AMLODIPINE BESYLATE 10 MG PO TABS: 10.0000 mg | ORAL_TABLET | Freq: Every day | ORAL | Status: DC

## 2016-04-09 MED ORDER — MUPIROCIN 2 % EX OINT: 1.0000 "application " | TOPICAL_OINTMENT | Freq: Two times a day (BID) | CUTANEOUS | Status: DC

## 2016-04-09 MED FILL — MUPIROCIN 2% OINTMENT: 2 | 7 days supply | Qty: 22 | Fill #0

## 2016-04-09 MED FILL — FUROSEMIDE 20 MG TABLET: 20 | 90 days supply | Qty: 90 | Fill #0

## 2016-04-09 MED FILL — FLUTICASONE PROP 50 MCG SPR: 50 | 90 days supply | Qty: 48 | Fill #0

## 2016-04-09 NOTE — Progress Notes (Signed)
   Subjective:    Patient ID: Jamie Harding, female    DOB: 15-Mar-1956, 60 y.o.   MRN: [id number]  HPI This patient was seen today for chronic pain  The medication list was reviewed and updated.   -Compliance with medication: takes as prescribed   - Number patient states they take daily: 3-4  -when was the last dose patient took? This am  The patient was advised the importance of maintaining medication and not using illegal substances with these.  Refills needed: yes  The patient was educated that we can provide 3 monthly scripts for their medication, it is their responsibility to follow the instructions.  Side effects or complications from medications: none  Patient is aware that pain medications are meant to minimize the severity of the pain to allow their pain levels to improve to allow for better function. They are aware of that pain medications cannot totally remove their pain.  Due for UDT ( at least once per year) : last one May 2016  Sees Dr. Dorris Fetch for diabetes. She is not seen him in a while she know she needs to get lab work and follow-up with him but she states she may or may not  Needs refills on all meds.   Please see below greater and 25 minutes was spent with this patient discussing her obesity the importance of losing weight and diabetes been under poor control in the importance of getting this under better control plus also discussing her chronic pain      Review of Systems Patient denies excessive thirst urination nausea vomiting diarrhea she does relates chronic back pain and discomfort.    Objective:   Physical Exam Neck no masses lungs are clear no crackles heart is regular pulse normal abdomen soft obese extremities no edema       Assessment & Plan:  Blood pressure under good control continue current measures.  Severe diabetes issues she needs to get back in with her diabetic doctor she chooses not to get back in with them then she will need  comprehensive follow-up here I talk with her at length about the importance of losing weight watching diet try to bring her weight down hopefully getting her A1c at 7 or less  Chronic pain-prescriptions on her medicine were given 3 prescriptions she states it does not cause drowsiness she may continue to use this as directed. 25 minutes spent with patient discussing her morbid obesity poor diabetes control and chronic pain. The importance of losing weight also discussion of pain proper use was discussed plus also discussion of weight reduction surgery patient states she might consider gastric sleeve

## 2016-04-12 MED FILL — ADVAIR 250/50 DISKUS: 250-50 | 30 days supply | Qty: 60 | Fill #0

## 2016-04-12 MED FILL — VENTOLIN HFA 90 MCG INHALER: 108 (90 BAS | 25 days supply | Qty: 18 | Fill #9

## 2016-04-17 ENCOUNTER — Encounter: Payer: Self-pay | Admitting: Family Medicine

## 2016-04-17 DIAGNOSIS — M25579 Pain in unspecified ankle and joints of unspecified foot: Secondary | ICD-10-CM

## 2016-04-17 NOTE — Telephone Encounter (Signed)
Jamie Harding will be happy to make the referral. Our nursing staff will put in the referral, Izora Ribas will be working on this referral you should hear something from [name] over the course of the next 7-8 days

## 2016-04-18 NOTE — Addendum Note (Signed)
Addended by: Launa Grill on: 04/18/2016 08:12 AM   Modules accepted: Orders

## 2016-04-20 ENCOUNTER — Encounter: Payer: Self-pay | Admitting: Family Medicine

## 2016-05-23 ENCOUNTER — Other Ambulatory Visit: Payer: Self-pay | Admitting: Family Medicine

## 2016-05-23 MED FILL — ONDANSETRON HCL 8 MG TABLET: 8 | 10 days supply | Qty: 20 | Fill #0 | Status: TO

## 2016-05-23 MED FILL — VENLAFAXINE HCL 75 MG TAB: 75 | 90 days supply | Qty: 180 | Fill #0

## 2016-05-23 MED FILL — VENTOLIN HFA 90 MCG INHALER: 108 (90 BAS | 90 days supply | Qty: 54 | Fill #0

## 2016-05-23 MED FILL — PROMETHAZINE 25 MG TABLET: 25 | 10 days supply | Qty: 30 | Fill #1 | Status: TO

## 2016-05-28 ENCOUNTER — Encounter: Payer: Self-pay | Admitting: *Deleted

## 2016-05-28 NOTE — Patient Outreach (Signed)
Jamie Harding has not been seen since 05/09/15 and has not responded to calls. She will be mailed a letter today with request to make a Link To Wellness  appointment with in 10 days of  receiving the letter or risk termination from the program. Barrington Ellison RN,CCM,CDE [place] Management Coordinator Link To Wellness Office Phone [phone] Office Fax [phone]

## 2016-05-29 ENCOUNTER — Other Ambulatory Visit: Payer: Self-pay | Admitting: "Endocrinology

## 2016-05-29 DIAGNOSIS — E1165 Type 2 diabetes mellitus with hyperglycemia: Secondary | ICD-10-CM | POA: Diagnosis not present

## 2016-05-29 DIAGNOSIS — E039 Hypothyroidism, unspecified: Secondary | ICD-10-CM | POA: Diagnosis not present

## 2016-05-29 DIAGNOSIS — E1169 Type 2 diabetes mellitus with other specified complication: Secondary | ICD-10-CM | POA: Diagnosis not present

## 2016-05-29 DIAGNOSIS — IMO0002 Reserved for concepts with insufficient information to code with codable children: Secondary | ICD-10-CM

## 2016-05-29 LAB — HEMOGLOBIN A1C
HEMOGLOBIN A1C: 10.4 % — AB (ref <5.7)
Mean Plasma Glucose: 252 mg/dL

## 2016-05-30 LAB — BASIC METABOLIC PANEL
BUN: 12 mg/dL (ref 7–25)
CO2: 22 mmol/L (ref 20–31)
Calcium: 9 mg/dL (ref 8.6–10.4)
Chloride: 102 mmol/L (ref 98–110)
Creat: 0.77 mg/dL (ref 0.50–1.05)
Glucose, Bld: 229 mg/dL — ABNORMAL HIGH (ref 65–99)
Potassium: 4.3 mmol/L (ref 3.5–5.3)
SODIUM: 137 mmol/L (ref 135–146)

## 2016-05-30 LAB — T4, FREE: FREE T4: 1.2 ng/dL (ref 0.8–1.8)

## 2016-05-30 LAB — TSH: TSH: 0.59 mIU/L

## 2016-05-31 ENCOUNTER — Other Ambulatory Visit: Payer: Self-pay | Admitting: *Deleted

## 2016-05-31 NOTE — Patient Outreach (Addendum)
Returned call to [name] and left message on her cell phone advising her that Link To Wellness appointment can be scheduled on Monday August 31st anytime 10:00 am and after up until 3:30 pm. Await return call. Barrington Ellison RN,CCM,CDE [place] Management Coordinator Link To Wellness Office Phone [phone] Office Fax [phone]

## 2016-06-01 NOTE — Patient Outreach (Signed)
Returned call to [place] as she is responding to a letter that was sent to her regarding her need to arrange  Link To Wellness follow up or risk termination from the program. She returned call and an appointment was arranged for July 31st at the  [place] office. Barrington Ellison RN,CCM,CDE [place] Management Coordinator Link To Wellness Office Phone [phone] Office Fax [phone]

## 2016-06-07 ENCOUNTER — Encounter: Payer: Self-pay | Admitting: Family Medicine

## 2016-06-07 MED ORDER — NOVOLOG FLEXPEN 100 UNIT/ML ~~LOC~~ SOPN: 8.0000 [IU] | PEN_INJECTOR | Freq: Three times a day (TID) | SUBCUTANEOUS | Status: DC

## 2016-06-07 MED ORDER — INSULIN GLARGINE 100 UNIT/ML SOLOSTAR PEN: 50.0000 [IU] | PEN_INJECTOR | Freq: Every day | SUBCUTANEOUS | Status: DC

## 2016-06-07 MED FILL — LANTUS SOLOSTAR 100 UNITS/M: 100 | 30 days supply | Qty: 15 | Fill #0

## 2016-06-07 MED FILL — NOVOLOG FLEXPEN SYRINGE: 100 | 62 days supply | Qty: 15 | Fill #0

## 2016-06-07 NOTE — Telephone Encounter (Signed)
Nurses please send in 3 month refill as requested by the patient. Also please have the front call the patient to assist her with getting an appointment for this coming week thank you A Giannina-sorry for your circumstances, we will see you syndrome-Dr. Nicki Reaper

## 2016-06-07 NOTE — Addendum Note (Signed)
Addended by: Jesusita Oka on: 06/07/2016 02:49 PM   Modules accepted: Orders

## 2016-06-08 ENCOUNTER — Encounter: Payer: Self-pay | Admitting: Family Medicine

## 2016-06-08 ENCOUNTER — Ambulatory Visit (INDEPENDENT_AMBULATORY_CARE_PROVIDER_SITE_OTHER): Payer: 59 | Admitting: Family Medicine

## 2016-06-08 VITALS — BP 132/88 | Ht 62.0 in | Wt 233.0 lb

## 2016-06-08 DIAGNOSIS — E785 Hyperlipidemia, unspecified: Secondary | ICD-10-CM | POA: Diagnosis not present

## 2016-06-08 DIAGNOSIS — Z79899 Other long term (current) drug therapy: Secondary | ICD-10-CM

## 2016-06-08 DIAGNOSIS — Z79891 Long term (current) use of opiate analgesic: Secondary | ICD-10-CM | POA: Diagnosis not present

## 2016-06-08 MED ORDER — ALPRAZOLAM 0.5 MG PO TABS: 0.5000 mg | ORAL_TABLET | Freq: Two times a day (BID) | ORAL | Status: DC | PRN

## 2016-06-08 NOTE — Progress Notes (Signed)
   Subjective:    Patient ID: Jamie Harding, female    DOB: [birth year], 58 y.o.   MRN: [id number]  Hyperlipidemia This is a chronic problem. The current episode started more than 1 year ago. Compliance problems include adherence to exercise.    Pt sees dr Jamie Harding. Has a1c done this month through him. Wants to stop seeing him and come back here for diabetes. Wants refills on insulin.  More than likely patient will follow-up with [name] regarding diabetes because she has lost her job therefore she will be without insurance. She states that she will be following up here with her diabetes Patient also needs a refill on her pain medicine.  Review of Systems     Objective:   Physical Exam  Lungs clear hearts regular low back subjective tenderness extremities no edema BP good      Assessment & Plan:  Chronic pain-has chronic back pain and neuropathic pain renewal of hydrocodone given urine drug screen taken patient is well versed on the proper way how to take her pain medicines  Patient lost her job because she cannot go to the job frequent enough to keep it. She unfortunately has significant disability issues. We will help her apply for disability.  Diabetes subpar control encourage regular diet ongoing medications. Patient was told about a couple different resources to help with her medications. She may be trying for Obama care or possibly through went worth governmental building with Jamie Harding

## 2016-06-12 ENCOUNTER — Other Ambulatory Visit: Payer: Self-pay | Admitting: Family Medicine

## 2016-06-12 ENCOUNTER — Telehealth: Payer: Self-pay | Admitting: Family Medicine

## 2016-06-12 DIAGNOSIS — Z79899 Other long term (current) drug therapy: Secondary | ICD-10-CM | POA: Diagnosis not present

## 2016-06-12 DIAGNOSIS — E785 Hyperlipidemia, unspecified: Secondary | ICD-10-CM | POA: Diagnosis not present

## 2016-06-12 LAB — HEPATIC FUNCTION PANEL
ALBUMIN: 4.1 g/dL (ref 3.6–5.1)
ALK PHOS: 73 U/L (ref 33–130)
ALT: 23 U/L (ref 6–29)
AST: 19 U/L (ref 10–35)
BILIRUBIN INDIRECT: 0.5 mg/dL (ref 0.2–1.2)
BILIRUBIN TOTAL: 0.6 mg/dL (ref 0.2–1.2)
Bilirubin, Direct: 0.1 mg/dL (ref <=0.2)
Total Protein: 7.3 g/dL (ref 6.1–8.1)

## 2016-06-12 LAB — LIPID PANEL
Cholesterol: 173 mg/dL (ref 125–200)
HDL: 52 mg/dL (ref >=46)
LDL CALC: 97 mg/dL (ref <130)
Total CHOL/HDL Ratio: 3.3 Ratio (ref <=5.0)
Triglycerides: 122 mg/dL (ref <150)
VLDL: 24 mg/dL (ref <30)

## 2016-06-12 MED FILL — ONE TOUCH ULTRA TEST STRIPS: 50 days supply | Qty: 200 | Fill #4

## 2016-06-12 MED FILL — ALPRAZolam 0.5 MG TABS: 0.5 | 30 days supply | Qty: 60 | Fill #0

## 2016-06-12 MED FILL — UNIFINE PENTIPS 8MM 31G: 31G X 8 MM | 90 days supply | Qty: 300 | Fill #0

## 2016-06-12 MED FILL — ADVAIR 250/50 DISKUS: 250-50 | 30 days supply | Qty: 60 | Fill #1

## 2016-06-12 NOTE — Telephone Encounter (Signed)
Pt dropped of letter for you to review in your basket on the shelf

## 2016-06-13 NOTE — Telephone Encounter (Signed)
I reviewed over patient's narrative regarding her health issues and how they affect her ability to work. It is my clinical opinion that this patient would not be able to work her regular job on a regular basis. I do not feel she is capable of doing 40 hours a week. Also feel that she would miss multiple days of work monthly that would preclude her ability to hold a standard job. Therefore I believe this patient is disabled. The health information that she filled out will be scanned into the chart

## 2016-06-17 LAB — TOXASSURE SELECT 13 (MW), URINE: PDF: 0

## 2016-06-18 ENCOUNTER — Ambulatory Visit: Payer: Self-pay | Admitting: *Deleted

## 2016-06-24 ENCOUNTER — Encounter: Payer: Self-pay | Admitting: Family Medicine

## 2016-06-28 ENCOUNTER — Other Ambulatory Visit: Payer: Self-pay | Admitting: *Deleted

## 2016-06-28 NOTE — Patient Outreach (Signed)
Left message for Jamie Harding on her cell phone advising her if she does not call and make an appointment by 07/02/16 she will be terminated from the program for the rest of 2017 with loss of program benefits including her diabetes medicines. Barrington Ellison RN,CCM,CDE [place] Management Coordinator Link To Wellness Office Phone [phone] Office Fax [phone]

## 2016-06-29 ENCOUNTER — Encounter: Payer: Self-pay | Admitting: Family Medicine

## 2016-06-29 MED ORDER — FLUCONAZOLE 150 MG PO TABS: 150.0000 mg | ORAL_TABLET | Freq: Every day | ORAL | 0 refills | Status: DC

## 2016-07-05 ENCOUNTER — Telehealth: Payer: Self-pay | Admitting: Orthopedic Surgery

## 2016-07-05 NOTE — Telephone Encounter (Signed)
As noted in [place], patient has requested appointment for follow up of hands; I have responded both by phone and MyChart and have offered appointments.  Patient most recently states has lost job; therefore, no insurance at this time.  Responded that she may wish to contact [place] counselor, and provided direct ph# [phone].

## 2016-07-10 ENCOUNTER — Ambulatory Visit: Payer: 59 | Admitting: Family Medicine

## 2016-07-20 ENCOUNTER — Telehealth: Payer: Self-pay | Admitting: Family Medicine

## 2016-07-20 MED ORDER — HYDROCODONE-ACETAMINOPHEN 5-325 MG PO TABS
1.0000 | ORAL_TABLET | Freq: Four times a day (QID) | ORAL | 0 refills | Status: DC | PRN
Start: 2016-07-20 — End: 2016-09-13

## 2016-07-20 MED ORDER — HYDROCODONE-ACETAMINOPHEN 5-325 MG PO TABS: 1.0000 | ORAL_TABLET | Freq: Four times a day (QID) | ORAL | 0 refills | Status: DC | PRN

## 2016-07-20 NOTE — Telephone Encounter (Signed)
Write two mo worth since seen over a month ago

## 2016-07-20 NOTE — Telephone Encounter (Signed)
Spoke with patient and informed her per Dr.Steve Luking- Prescriptions are ready for pick up.Patient verbalized understanding.

## 2016-07-20 NOTE — Telephone Encounter (Signed)
Pt is needing her pain medication. Pt was told Tuesday that the prescriptions would be up at the desk, but they are not. Pt is out of her pain medication. Please advise.

## 2016-08-06 ENCOUNTER — Encounter: Payer: Self-pay | Admitting: *Deleted

## 2016-08-06 NOTE — Patient Outreach (Signed)
Jamie Harding is being terminated from  the [employer] for failure to keep the appointment policy. Her last Link To Wellness visit was 05/09/15. Barrington Ellison RN,CCM,CDE [place] Management Coordinator Link To Wellness Office Phone [phone] Office Fax [phone]

## 2016-08-06 NOTE — Patient Outreach (Signed)
Jamie Harding has been terminated form the [facility] Link To Wellness program for afilure to keep the program's appointment policy. Barrington Ellison RN,CCM,CDE [place] Management Coordinator Link To Wellness Office Phone [phone] Office Fax [phone]

## 2016-08-15 DIAGNOSIS — Z0289 Encounter for other administrative examinations: Secondary | ICD-10-CM

## 2016-08-28 ENCOUNTER — Encounter: Payer: Self-pay | Admitting: Family Medicine

## 2016-08-29 NOTE — Telephone Encounter (Signed)
-----   Message from Kathyrn Drown, MD sent at 08/29/2016 12:18 PM EDT -----  I read the patient's message. #1 please create telephone message under her husband's name to document that he did not take the medication cause of the cost and he is watching diet and he will repeat A1c in 3 months #2 is much as I 1 to help her son Einar Pheasant he needs to call to initiate this because of his age and we will be happy to help set him up #3 politely tell her in the future to try to send messages that correlate directly with the patient's chart because that greatly helps keeping a electronic trail of any message

## 2016-09-07 ENCOUNTER — Encounter: Payer: Self-pay | Admitting: Family Medicine

## 2016-09-07 MED ORDER — HYDROCODONE-ACETAMINOPHEN 5-325 MG PO TABS: 1.0000 | ORAL_TABLET | Freq: Four times a day (QID) | ORAL | 0 refills | Status: DC | PRN

## 2016-09-07 NOTE — Addendum Note (Signed)
Addended by: Ofilia Neas R on: 09/07/2016 01:43 PM   Modules accepted: Orders

## 2016-09-13 ENCOUNTER — Encounter: Payer: Self-pay | Admitting: Family Medicine

## 2016-09-13 ENCOUNTER — Ambulatory Visit (INDEPENDENT_AMBULATORY_CARE_PROVIDER_SITE_OTHER): Payer: Self-pay | Admitting: Family Medicine

## 2016-09-13 VITALS — BP 118/78 | Ht 62.0 in | Wt 236.2 lb

## 2016-09-13 DIAGNOSIS — E119 Type 2 diabetes mellitus without complications: Secondary | ICD-10-CM

## 2016-09-13 DIAGNOSIS — M545 Low back pain, unspecified: Secondary | ICD-10-CM

## 2016-09-13 DIAGNOSIS — I10 Essential (primary) hypertension: Secondary | ICD-10-CM

## 2016-09-13 DIAGNOSIS — M79605 Pain in left leg: Secondary | ICD-10-CM

## 2016-09-13 DIAGNOSIS — Z794 Long term (current) use of insulin: Secondary | ICD-10-CM

## 2016-09-13 LAB — POCT GLYCOSYLATED HEMOGLOBIN (HGB A1C): Hemoglobin A1C: 9.4

## 2016-09-13 MED ORDER — HYDROCODONE-ACETAMINOPHEN 5-325 MG PO TABS: 1.0000 | ORAL_TABLET | Freq: Four times a day (QID) | ORAL | 0 refills | Status: DC | PRN

## 2016-09-13 NOTE — Progress Notes (Signed)
   Subjective:    Patient ID: Jamie Harding, female    DOB: [birth year], 58 y.o.   MRN: [id number]  HPI This patient was seen today for chronic pain  The medication list was reviewed and updated.   -Compliance with medication: Takes daily  - Number patient states they take daily: States 2-4 tablets daily. Usually 3 tablets.   -when was the last dose patient took? This morning  The patient was advised the importance of maintaining medication and not using illegal substances with these.  Refills needed: Yes  The patient was educated that we can provide 3 monthly scripts for their medication, it is their responsibility to follow the instructions.  Side effects or complications from medications: None   Patient is aware that pain medications are meant to minimize the severity of the pain to allow their pain levels to improve to allow for better function. They are aware of that pain medications cannot totally remove their pain.  Due for UDT ( at least once per year) : 05/2017  States no other concerns this visit.    Patient is also diabetic. She takes her insulin. Her numbers been running in the 200s. She states she could be doing a better job with her diet unable to exercise because of her disability  Review of Systems  Constitutional: Negative for activity change, appetite change and fatigue.  HENT: Negative for congestion.   Respiratory: Negative for cough.   Cardiovascular: Negative for chest pain.  Gastrointestinal: Negative for abdominal pain.  Endocrine: Negative for polydipsia and polyphagia.  Neurological: Negative for weakness.  Psychiatric/Behavioral: Negative for confusion.       Objective:   Physical Exam  Constitutional: She appears well-nourished. No distress.  Cardiovascular: Normal rate, regular rhythm and normal heart sounds.   No murmur heard. Pulmonary/Chest: Effort normal and breath sounds normal. No respiratory distress.  Musculoskeletal: She exhibits no  edema.  Lymphadenopathy:    She has no cervical adenopathy.  Neurological: She is alert. She exhibits normal muscle tone.  Psychiatric: Her behavior is normal.  Vitals reviewed.         Assessment & Plan:  Diabetes subpar control patient needs increasing amount of insulin. She needs to do much better job watching her diet and stay more active she will increase her insulin at mealtimes the 20th breakfast and 24 later in the day she will increase her long-acting insulin from 120 to 130  She will look into the prescription assistance program with [place] she will let us know  Chronic pain prescription 3 prescriptions given she was instructed to follow-up if any problems otherwise follow-up in approximate 3 months she does not abuse medication

## 2016-10-09 ENCOUNTER — Other Ambulatory Visit: Payer: Self-pay | Admitting: Family Medicine

## 2016-10-29 ENCOUNTER — Encounter: Payer: Self-pay | Admitting: Family Medicine

## 2016-10-29 NOTE — Telephone Encounter (Signed)
Please talk with the patient. It would be fine to go ahead and print out prescriptions for her medicines I will sign them these can be picked up by the patient to take to [facility] -typically pain medications and nerve pills will not be assisted by the county. Therefore I do not recommend printing any prescription for pain or nerve pills

## 2016-10-31 ENCOUNTER — Other Ambulatory Visit: Payer: Self-pay | Admitting: *Deleted

## 2016-10-31 ENCOUNTER — Telehealth: Payer: Self-pay | Admitting: *Deleted

## 2016-10-31 MED ORDER — INSULIN GLARGINE 100 UNIT/ML SOLOSTAR PEN: 130.0000 [IU] | PEN_INJECTOR | Freq: Every day | SUBCUTANEOUS | 1 refills | Status: DC

## 2016-10-31 MED ORDER — FLUTICASONE-SALMETEROL 250-50 MCG/DOSE IN AEPB: INHALATION_SPRAY | RESPIRATORY_TRACT | 5 refills | Status: DC

## 2016-10-31 MED ORDER — ROSUVASTATIN CALCIUM 10 MG PO TABS: 10.0000 mg | ORAL_TABLET | Freq: Every day | ORAL | 1 refills | Status: DC

## 2016-10-31 MED ORDER — VENLAFAXINE HCL 75 MG PO TABS: 75.0000 mg | ORAL_TABLET | Freq: Three times a day (TID) | ORAL | 1 refills | Status: DC

## 2016-10-31 MED ORDER — CETIRIZINE HCL 10 MG PO TABS: 10.0000 mg | ORAL_TABLET | Freq: Every day | ORAL | 1 refills | Status: DC

## 2016-10-31 MED ORDER — ALBUTEROL SULFATE HFA 108 (90 BASE) MCG/ACT IN AERS: INHALATION_SPRAY | RESPIRATORY_TRACT | 5 refills | Status: DC

## 2016-10-31 MED ORDER — FLUTICASONE PROPIONATE 50 MCG/ACT NA SUSP: 2.0000 | Freq: Every day | NASAL | 5 refills | Status: DC

## 2016-10-31 MED ORDER — METFORMIN HCL 1000 MG PO TABS: 1000.0000 mg | ORAL_TABLET | Freq: Two times a day (BID) | ORAL | 1 refills | Status: DC

## 2016-10-31 MED ORDER — AMLODIPINE BESYLATE 10 MG PO TABS: 10.0000 mg | ORAL_TABLET | Freq: Every day | ORAL | 1 refills | Status: DC

## 2016-10-31 MED ORDER — FUROSEMIDE 20 MG PO TABS: 20.0000 mg | ORAL_TABLET | Freq: Every day | ORAL | 1 refills | Status: DC

## 2016-10-31 MED ORDER — PANTOPRAZOLE SODIUM 40 MG PO TBEC: 40.0000 mg | DELAYED_RELEASE_TABLET | Freq: Two times a day (BID) | ORAL | 1 refills | Status: DC

## 2016-10-31 MED ORDER — NOVOLOG FLEXPEN 100 UNIT/ML ~~LOC~~ SOPN: 22.0000 [IU] | PEN_INJECTOR | Freq: Three times a day (TID) | SUBCUTANEOUS | 0 refills | Status: DC

## 2016-10-31 NOTE — Telephone Encounter (Signed)
Pt wanted to print out prescriptions to be picked up. Pt states she uses 130 units of lantus but her med list states 50 units and novolog she states 22 units tid and med list states 8 units tid. Is it ok to change.

## 2016-10-31 NOTE — Telephone Encounter (Signed)
It is okay to change this in Epic to reflect what she is using currently

## 2016-10-31 NOTE — Telephone Encounter (Signed)
Scripts ready. Pt notified.  

## 2016-12-04 ENCOUNTER — Other Ambulatory Visit: Payer: Self-pay | Admitting: *Deleted

## 2016-12-04 MED ORDER — FLUTICASONE PROPIONATE 50 MCG/ACT NA SUSP: 2.0000 | Freq: Every day | NASAL | 3 refills | Status: DC

## 2016-12-04 MED ORDER — BENZONATATE 100 MG PO CAPS: 100.0000 mg | ORAL_CAPSULE | Freq: Three times a day (TID) | ORAL | 4 refills | Status: DC | PRN

## 2016-12-14 ENCOUNTER — Ambulatory Visit (INDEPENDENT_AMBULATORY_CARE_PROVIDER_SITE_OTHER): Payer: Self-pay | Admitting: Family Medicine

## 2016-12-14 VITALS — BP 120/78 | Wt 224.2 lb

## 2016-12-14 DIAGNOSIS — Z79891 Long term (current) use of opiate analgesic: Secondary | ICD-10-CM

## 2016-12-14 MED ORDER — HYDROCODONE-ACETAMINOPHEN 5-325 MG PO TABS: 1.0000 | ORAL_TABLET | Freq: Four times a day (QID) | ORAL | 0 refills | Status: DC | PRN

## 2016-12-14 NOTE — Progress Notes (Signed)
   Subjective:    Patient ID: Jamie Harding, female    DOB: [birth year], 61 y.o.   MRN: [id number]  HPI This patient was seen today for chronic pain  The medication list was reviewed and updated.   -Compliance with medication: Yes  - Number patient states they take daily: 3-4  -when was the last dose patient took? 0800 today  The patient was advised the importance of maintaining medication and not using illegal substances with these.  Refills needed: Yes  The patient was educated that we can provide 3 monthly scripts for their medication, it is their responsibility to follow the instructions.  Side effects or complications from medications: No  Patient is aware that pain medications are meant to minimize the severity of the pain to allow their pain levels to improve to allow for better function. They are aware of that pain medications cannot totally remove their pain.  Due for UDT ( at least once per year) : UTD 06/08/16    Review of Systems  Constitutional: Negative for activity change and appetite change.  Gastrointestinal: Negative for abdominal pain and vomiting.  Neurological: Negative for weakness.  Psychiatric/Behavioral: Negative for confusion.       Objective:   Physical Exam  Constitutional: She appears well-nourished. No distress.  HENT:  Head: Normocephalic.  Cardiovascular: Normal rate, regular rhythm and normal heart sounds.   No murmur heard. Pulmonary/Chest: Effort normal and breath sounds normal.  Musculoskeletal: She exhibits no edema.  Lymphadenopathy:    She has no cervical adenopathy.  Neurological: She is alert.  Psychiatric: Her behavior is normal.  Vitals reviewed.         Assessment & Plan:  The patient was seen today as part of a comprehensive visit regarding pain control. Patient's compliance with the medication as well as discussion regarding effectiveness was completed. Prescriptions were written. Patient was advised to follow-up  in 3 months. The patient was assessed for any signs of severe side effects. The patient was advised to take the medicine as directed and to report to [name] if any side effect issues.  Follow-up office visit for diabetes in 3 months and pain

## 2016-12-28 ENCOUNTER — Other Ambulatory Visit: Payer: Self-pay

## 2016-12-28 MED ORDER — PROMETHAZINE HCL 25 MG PO TABS: ORAL_TABLET | ORAL | 3 refills | Status: DC

## 2017-03-14 ENCOUNTER — Encounter: Payer: Self-pay | Admitting: Family Medicine

## 2017-03-14 ENCOUNTER — Ambulatory Visit (INDEPENDENT_AMBULATORY_CARE_PROVIDER_SITE_OTHER): Payer: Self-pay | Admitting: Family Medicine

## 2017-03-14 VITALS — BP 128/70 | Ht 62.0 in | Wt 214.1 lb

## 2017-03-14 DIAGNOSIS — E784 Other hyperlipidemia: Secondary | ICD-10-CM

## 2017-03-14 DIAGNOSIS — G894 Chronic pain syndrome: Secondary | ICD-10-CM

## 2017-03-14 DIAGNOSIS — Z79899 Other long term (current) drug therapy: Secondary | ICD-10-CM

## 2017-03-14 DIAGNOSIS — Z794 Long term (current) use of insulin: Secondary | ICD-10-CM

## 2017-03-14 DIAGNOSIS — E7849 Other hyperlipidemia: Secondary | ICD-10-CM

## 2017-03-14 DIAGNOSIS — E1140 Type 2 diabetes mellitus with diabetic neuropathy, unspecified: Secondary | ICD-10-CM

## 2017-03-14 DIAGNOSIS — I10 Essential (primary) hypertension: Secondary | ICD-10-CM

## 2017-03-14 LAB — POCT GLYCOSYLATED HEMOGLOBIN (HGB A1C): HEMOGLOBIN A1C: 7.1

## 2017-03-14 MED ORDER — FLUCONAZOLE 150 MG PO TABS: ORAL_TABLET | ORAL | 12 refills | Status: DC

## 2017-03-14 MED ORDER — HYDROCODONE-ACETAMINOPHEN 5-325 MG PO TABS: 1.0000 | ORAL_TABLET | Freq: Four times a day (QID) | ORAL | 0 refills | Status: DC | PRN

## 2017-03-14 MED ORDER — FUROSEMIDE 20 MG PO TABS: 20.0000 mg | ORAL_TABLET | Freq: Every day | ORAL | 1 refills | Status: DC

## 2017-03-14 MED ORDER — SITAGLIPTIN PHOSPHATE 100 MG PO TABS: 100.0000 mg | ORAL_TABLET | Freq: Every day | ORAL | 3 refills | Status: DC

## 2017-03-14 NOTE — Progress Notes (Signed)
   Subjective:    Patient ID: Jamie Harding, female    DOB: [birth year], 61 y.o.   MRN: [id number]  HPI This patient was seen today for chronic pain  The medication list was reviewed and updated.   -Compliance with medication: yes  - Number patient states they take daily:3-4 daily    -when was the last dose patient took? This am   The patient was advised the importance of maintaining medication and not using illegal substances with these.  Refills needed: yes  The patient was educated that we can provide 3 monthly scripts for their medication, it is their responsibility to follow the instructions.  Side effects or complications from medications: none  Patient is aware that pain medications are meant to minimize the severity of the pain to allow their pain levels to improve to allow for better function. They are aware of that pain medications cannot totally remove their pain.  Due for UDT ( at least once per year) : up to date Patient states she needs a prescription for diabetic medicine Takes her reflux medicine does well Takes her blood pressure medicine watches her diet Does take her allergy medicines And her antidepressant. Keep been under fairly good control.  Patient does have history gastroparesis with frequent reflux and vomiting Patient wants to discuss needing Januvia, Furosemide and Diflucan.    Review of Systems  Constitutional: Negative for activity change, appetite change and fatigue.  HENT: Negative for congestion.   Respiratory: Negative for cough.   Cardiovascular: Negative for chest pain.  Gastrointestinal: Negative for abdominal pain.  Endocrine: Negative for polydipsia and polyphagia.  Neurological: Negative for weakness.  Psychiatric/Behavioral: Negative for confusion.       Objective:   Physical Exam  Constitutional: She appears well-nourished. No distress.  Cardiovascular: Normal rate, regular rhythm and normal heart sounds.   No murmur  heard. Pulmonary/Chest: Effort normal and breath sounds normal. No respiratory distress.  Musculoskeletal: She exhibits no edema.  Lymphadenopathy:    She has no cervical adenopathy.  Neurological: She is alert. She exhibits normal muscle tone.  Psychiatric: Her behavior is normal.  Vitals reviewed.         Assessment & Plan:  Extremely complex visit  Blood pressure good control continue current measures  Chronic pain with low back and her feet 3 prescriptions on pain medicine was given cautioned drowsiness  Hyperlipidemia previous labs reviewed continue current measures  Diabetes A1c looks good continue medications foot exam was completed mild neuropathy  Moderate obesity patient is losing weight

## 2017-04-30 ENCOUNTER — Other Ambulatory Visit: Payer: Self-pay | Admitting: Family Medicine

## 2017-04-30 NOTE — Telephone Encounter (Signed)
Patient last seen April 2018

## 2017-05-14 ENCOUNTER — Encounter: Payer: Self-pay | Admitting: Family Medicine

## 2017-05-14 ENCOUNTER — Telehealth: Payer: 59 | Admitting: Family

## 2017-05-14 DIAGNOSIS — N390 Urinary tract infection, site not specified: Secondary | ICD-10-CM

## 2017-05-14 DIAGNOSIS — A499 Bacterial infection, unspecified: Secondary | ICD-10-CM | POA: Diagnosis not present

## 2017-05-14 MED ORDER — NITROFURANTOIN MONOHYD MACRO 100 MG PO CAPS: 100.0000 mg | ORAL_CAPSULE | Freq: Two times a day (BID) | ORAL | 0 refills | Status: DC

## 2017-05-14 MED ORDER — CEPHALEXIN 500 MG PO CAPS: 500.0000 mg | ORAL_CAPSULE | Freq: Four times a day (QID) | ORAL | 0 refills | Status: DC

## 2017-05-14 NOTE — Progress Notes (Signed)

## 2017-05-14 NOTE — Addendum Note (Signed)
Addended by: Ofilia Neas R on: 05/14/2017 04:00 PM   Modules accepted: Orders

## 2017-05-14 NOTE — Telephone Encounter (Signed)
Nurses please send then Keflex 500 mg one 4 times a day for 5 days follow-up if trouble

## 2017-05-30 ENCOUNTER — Encounter: Payer: Self-pay | Admitting: Family Medicine

## 2017-05-30 NOTE — Telephone Encounter (Signed)
Please have Izora Ribas or Estill Bamberg send a referral to Davy Pique gone at the health department regarding this patient. Include a list of her current medications. Inform the patient that we are doing this. Sonya then will be able to figure out which medications they can get in typically [facility] Pique will then send [name] prescriptions for [name] to sign

## 2017-06-26 ENCOUNTER — Ambulatory Visit: Payer: Self-pay | Admitting: Family Medicine

## 2017-07-16 ENCOUNTER — Ambulatory Visit: Payer: Self-pay | Admitting: Family Medicine

## 2017-07-24 ENCOUNTER — Emergency Department (HOSPITAL_COMMUNITY): Payer: Self-pay

## 2017-07-24 ENCOUNTER — Encounter (HOSPITAL_COMMUNITY): Payer: Self-pay

## 2017-07-24 ENCOUNTER — Emergency Department (HOSPITAL_COMMUNITY)
Admission: EM | Admit: 2017-07-24 | Discharge: 2017-07-24 | Disposition: A | Discharge status: Home / Self Care | Payer: Self-pay | Attending: Emergency Medicine | Admitting: Emergency Medicine

## 2017-07-24 DIAGNOSIS — I10 Essential (primary) hypertension: Secondary | ICD-10-CM | POA: Insufficient documentation

## 2017-07-24 DIAGNOSIS — E119 Type 2 diabetes mellitus without complications: Secondary | ICD-10-CM | POA: Insufficient documentation

## 2017-07-24 DIAGNOSIS — Y998 Other external cause status: Secondary | ICD-10-CM | POA: Insufficient documentation

## 2017-07-24 DIAGNOSIS — Z87891 Personal history of nicotine dependence: Secondary | ICD-10-CM | POA: Insufficient documentation

## 2017-07-24 DIAGNOSIS — Y92009 Unspecified place in unspecified non-institutional (private) residence as the place of occurrence of the external cause: Secondary | ICD-10-CM | POA: Insufficient documentation

## 2017-07-24 DIAGNOSIS — J45909 Unspecified asthma, uncomplicated: Secondary | ICD-10-CM | POA: Insufficient documentation

## 2017-07-24 DIAGNOSIS — Y9389 Activity, other specified: Secondary | ICD-10-CM | POA: Insufficient documentation

## 2017-07-24 DIAGNOSIS — Z7984 Long term (current) use of oral hypoglycemic drugs: Secondary | ICD-10-CM | POA: Insufficient documentation

## 2017-07-24 DIAGNOSIS — W010XXA Fall on same level from slipping, tripping and stumbling without subsequent striking against object, initial encounter: Secondary | ICD-10-CM | POA: Insufficient documentation

## 2017-07-24 DIAGNOSIS — Z79899 Other long term (current) drug therapy: Secondary | ICD-10-CM | POA: Insufficient documentation

## 2017-07-24 DIAGNOSIS — S92155A Nondisplaced avulsion fracture (chip fracture) of left talus, initial encounter for closed fracture: Secondary | ICD-10-CM | POA: Insufficient documentation

## 2017-07-24 MED ORDER — ACETAMINOPHEN 325 MG PO TABS
650.0000 mg | ORAL_TABLET | Freq: Once | ORAL | Status: AC
  Administered 2017-07-24: 650 mg via ORAL
  Filled 2017-07-24: qty 2

## 2017-07-24 NOTE — ED Triage Notes (Signed)
Pt slipped on wet floor today and hyperextended toes on left foot.  C/O pain since then.

## 2017-07-24 NOTE — ED Provider Notes (Signed)
[facility] DEPT Provider Note   CSN: [id number] Arrival date & time: 07/24/17  0859     History   Chief Complaint Chief Complaint  Patient presents with  . Foot Pain    HPI Jamie Harding is a 61 y.o. female presenting with left foot pain after slipping this morning on a wet floor in her home.  She describes mopping floors yesterday evening and left the puddle behind which she walked through this morning, her left toes and forefoot became hyperflexed during her fall and has had assistant pain in bruising in the distal foot and toes since.  She was unable to bear weight initially with his injury but was able to use her heel for weightbearing since.  She has had no treatments prior to arrival.  She denies any other injuries with her fall.    The history is provided by the patient.    Past Medical History:  Diagnosis Date  . Anxiety   . Asthma   . Depression   . Essential hypertension   . Fatty liver   . Gastritis   . Gastroparesis   . GERD (gastroesophageal reflux disease)   . Hiatal hernia   . History of cardiac catheterization    Minimal coronary atherosclerosis February 2016  . History of kidney stones   . History of stroke 2008  . HLD (hyperlipidemia)   . Neuropathy   . Obesity   . Pancreatitis   . Rheumatoid arthritis(714.0)   . Tibialis tendinitis   . Type 2 diabetes mellitus [facility])     Patient Active Problem List   Diagnosis Date Noted  . Carpal tunnel syndrome of left wrist   . Trigger ring finger of left hand   . Carpal tunnel syndrome on right   . Trigger middle finger   . Morbid obesity due to excess calories ([place]) 09/05/2015  . Chronic pain syndrome 03/30/2015  . Shortness of breath   . History of CVA (cerebrovascular accident) without residual deficits 12/23/2014  . Dyspnea on exertion 11/29/2014  . Dyspareunia 09/16/2013  . Trigger finger, acquired 02/17/2013  . S/P carpal tunnel release 02/17/2013  . Lumbar pain with radiation down left leg  02/16/2013  . Greater trochanteric bursitis 01/29/2013  . Sinus tarsi syndrome 01/29/2013  . Chest tightness   . Ejection fraction   . Dyspepsia 08/28/2012  . Encounter for screening colonoscopy 08/28/2012  . Plantar fasciitis 12/20/2011  . TRIGGER FINGER, LEFT MIDDLE 07/19/2010  . GASTROPARESIS 07/19/2009  . Type 2 diabetes mellitus with vascular disease ([place]) 07/15/2009  . Hyperlipidemia 07/15/2009  . ANXIETY 07/15/2009  . DEPRESSION 07/15/2009  . Essential hypertension 07/15/2009  . ASTHMA, UNSPECIFIED 07/15/2009  . HIATAL HERNIA 07/15/2009  . Fatty liver 07/15/2009  . DYSPHAGIA UNSPECIFIED 07/15/2009  . PANCREATITIS, HX OF 07/15/2009  . GASTRITIS, HX OF 07/15/2009  . TIBIALIS TENDINITIS 07/12/2008    Past Surgical History:  Procedure Laterality Date  . ANKLE SURGERY     remove extra bone-left  . CARPAL TUNNEL RELEASE     right side  . CARPAL TUNNEL RELEASE Left 02/06/2013   Procedure: CARPAL TUNNEL RELEASE ;  Surgeon: Carole Civil, MD;  Location: [facility];  Service: Orthopedics;  Laterality: Left;  procedure end 1135  . CARPAL TUNNEL RELEASE Right 09/14/2015   Procedure: RIGHT CARPAL TUNNEL RELEASE;  Surgeon: Carole Civil, MD;  Location: [facility];  Service: Orthopedics;  Laterality: Right;  . CARPAL TUNNEL RELEASE Left 09/28/2015   Procedure: LEFT CARPAL TUNNEL  RELEASE;  Surgeon: Carole Civil, MD;  Location: [facility];  Service: Orthopedics;  Laterality: Left;  procedure 1  . CESAREAN SECTION    . CHOLECYSTECTOMY    . COLONOSCOPY WITH PROPOFOL  09/16/2012   WGN:FAOZ sessile polyps ranging between 3-24m in size were found in the sigmoid colon and rectum; polypectomy was performed/Mild diverticulosis was noted throughout the entire examined colon/Small internal hemorrhoids  . DILATION AND CURETTAGE OF UTERUS     multiple  . ESOPHAGOGASTRODUODENOSCOPY  07/29/2009   Mild gastritis, benign path  . ESOPHAGOGASTRODUODENOSCOPY (EGD) WITH PROPOFOL  09/16/2012    SLF:Non-erosive gastritis (inflammation) was found; multiple bx/The duodenal mucosa showed no abnormalities in the ampulla and bulb and second portion of the duodenum/NAUSEA/VOMITING MOST LIKELY DUE TO GERD/GASTRITIS  . FINGER SURGERY Left    left little finger-otif of finger  . Gastric Emptying  07/27/2009   The amount of activity in the stomach at 120 minutes was 13% which is in the normal range  . LASER ABLATION OF THE CERVIX    . LEFT AND RIGHT HEART CATHETERIZATION WITH CORONARY ANGIOGRAM N/A 01/03/2015   Procedure: LEFT AND RIGHT HEART CATHETERIZATION WITH CORONARY ANGIOGRAM;  Surgeon: MBlane Ohara MD;  [facility];  Service: Cardiovascular;  Laterality: N/A;  . LITHOTRIPSY    . POLYPECTOMY  09/16/2012   Procedure: POLYPECTOMY;  Surgeon: SDanie Binder MD;  Location: [facility];  Service: Endoscopy;  Laterality: N/A;  . SAVORY DILATION  09/16/2012   Procedure: SAVORY DILATION;  Surgeon: SDanie Binder MD;  Location: [facility];  Service: Endoscopy;  Laterality: N/A;  16 fr dilation  . TRIGGER FINGER RELEASE Left 02/06/2013   Procedure: RELEASE TRIGGER FINGER LEFT LONG FINGER/A-1 PULLEY;  Surgeon: SCarole Civil MD;  Location: [facility];  Service: Orthopedics;  Laterality: Left;  procedure began 1136  . TRIGGER FINGER RELEASE Right 09/14/2015   Procedure: RIGHT LONG FINGER TRIGGER RELEASE;  Surgeon: SCarole Civil MD;  Location: [facility];  Service: Orthopedics;  Laterality: Right;  . TRIGGER FINGER RELEASE Left 09/28/2015   Procedure: LEFT RING TRIGGER FINGER RELEASE;  Surgeon: SCarole Civil MD;  Location: [facility];  Service: Orthopedics;  Laterality: Left;  left ring finger  . TUBAL LIGATION      OB History    No data available       Home Medications    Prior to Admission medications   Medication Sig Start Date End Date Taking? Authorizing Provider  albuterol (VENTOLIN HFA) 108 (90 Base) MCG/ACT inhaler INHALE 2 PUFFS INTO THE LUNGS EVERY 4 (FOUR) HOURS AS NEEDED  FOR WHEEZING. 10/31/16   LKathyrn Drown MD  amLODipine (NORVASC) 10 MG tablet Take 1 tablet (10 mg total) by mouth daily. 10/31/16   LKathyrn Drown MD  aspirin EC 81 MG tablet Take 81 mg by mouth daily.    [provider]  Blood Glucose Monitoring Suppl (ONE TOUCH ULTRA SYSTEM KIT) W/DEVICE KIT 1 kit by Does not apply route QID. Use as directed 4 x daily 09/20/15   NCassandria Anger MD  cephALEXin (KEFLEX) 500 MG capsule Take 1 capsule (500 mg total) by mouth 4 (four) times daily. 05/14/17   LKathyrn Drown MD  cetirizine (ZYRTEC) 10 MG tablet Take 1 tablet (10 mg total) by mouth daily. 10/31/16   LKathyrn Drown MD  cyanocobalamin 1000 MCG tablet Take 1,000 mcg by mouth daily. Reported on 02/02/2016    [provider]  Ergocalciferol (VITAMIN  D2) 2000 UNITS TABS Take by mouth daily.    [provider]  fluconazole (DIFLUCAN) 150 MG tablet Take one tablet now and one tablet in one week 03/14/17   Kathyrn Drown, MD  fluticasone (FLONASE) 50 MCG/ACT nasal spray Place 2 sprays into both nostrils daily. 12/04/16   Kathyrn Drown, MD  Fluticasone-Salmeterol (ADVAIR DISKUS) 250-50 MCG/DOSE AEPB INHALE 1 PUFF BY MOUTH 2 TIMES DAILY. 10/31/16   Kathyrn Drown, MD  furosemide (LASIX) 20 MG tablet Take 1 tablet (20 mg total) by mouth daily. 03/14/17   Kathyrn Drown, MD  gabapentin (NEURONTIN) 300 MG capsule Two caps by mouth three times daily Patient taking differently: Take 300 mg by mouth. 1 QAM and 2 QHS 10/25/15   Carole Civil, MD  glucose blood test strip Use as instructed 4 x daily One touch Ultra test strips 09/20/15   Nida, Marella Chimes, MD  HYDROcodone-acetaminophen (NORCO/VICODIN) 5-325 MG tablet Take 1 tablet by mouth every 6 (six) hours as needed for moderate pain. 03/14/17   Kathyrn Drown, MD  insulin detemir (LEVEMIR) 100 UNIT/ML injection 70 units in the am and 60 units qhs    [provider]  Insulin Pen Needle (UNIFINE PENTIPS) 31G X 8  MM MISC USE AS DIRECTED THREE TIMES A DAY AS NEEDED 04/09/16   Kathyrn Drown, MD  lisinopril (PRINIVIL,ZESTRIL) 20 MG tablet Take 1.5 tablets (30 mg total) by mouth daily. 03/28/16   Satira Sark, MD  meloxicam (MOBIC) 15 MG tablet TAKE 1 TABLET BY MOUTH ONCE DAILY 02/17/16   Mikey Kirschner, MD  metFORMIN (GLUCOPHAGE) 1000 MG tablet Take 1 tablet (1,000 mg total) by mouth 2 (two) times daily. 10/31/16   Kathyrn Drown, MD  methocarbamol (ROBAXIN) 500 MG tablet Take 1 tablet (500 mg total) by mouth 2 (two) times daily as needed for muscle spasms. 03/30/15   Kathyrn Drown, MD  Multiple Vitamin (MULTIVITAMIN WITH MINERALS) TABS Take 1 tablet by mouth daily.    [provider]  mupirocin ointment (BACTROBAN) 2 % Apply 1 application topically 2 (two) times daily. 04/09/16   Kathyrn Drown, MD  nitrofurantoin, macrocrystal-monohydrate, (MACROBID) 100 MG capsule Take 1 capsule (100 mg total) by mouth 2 (two) times daily. 05/14/17   Dutch Quint B, FNP  NOVOLOG FLEXPEN 100 UNIT/ML FlexPen Inject 22 Units into the skin 3 (three) times daily with meals. 10/31/16   Kathyrn Drown, MD  ondansetron (ZOFRAN) 8 MG tablet TAKE ONE TABLET BY MOUTH EVERY 12 HOURS AS NEEDED FOR NAUSEA 04/30/17   Luking, Elayne Snare, MD  pantoprazole (PROTONIX) 40 MG tablet Take 1 tablet (40 mg total) by mouth 2 (two) times daily. 10/31/16 11/14/17  Kathyrn Drown, MD  promethazine (PHENERGAN) 25 MG tablet TAKE 1 TABLET BY MOUTH EVERY 8 HOURS AS NEEDED FOR NAUSEA. 02/17/16   Mikey Kirschner, MD  promethazine (PHENERGAN) 25 MG tablet Take 1 tablet by mouth every 8 hours as needed for nausea. 12/28/16   Kathyrn Drown, MD  pyridOXINE (VITAMIN B-6) 100 MG tablet Take 100 mg by mouth daily. Reported on 02/02/2016    [provider]  rosuvastatin (CRESTOR) 10 MG tablet Take 1 tablet (10 mg total) by mouth daily. 10/31/16   Kathyrn Drown, MD  sitaGLIPtin (JANUVIA) 100 MG tablet Take 1 tablet (100 mg total) by mouth  daily. 03/14/17   Kathyrn Drown, MD  venlafaxine (EFFEXOR) 75 MG tablet Take 1 tablet (75  mg total) by mouth 3 (three) times daily. 10/31/16   Kathyrn Drown, MD    Family History Family History  Problem Relation Age of Onset  . Breast cancer Mother   . Diabetes Father   . Coronary artery disease Unknown   . Arthritis Unknown   . Asthma Unknown   . Diabetes Sister   . Colon cancer Neg Hx     Social History Social History  Substance Use Topics  . Smoking status: Former Smoker    Packs/day: 0.50    Years: 3.00    Types: Cigarettes    Quit date: 09/11/1987  . Smokeless tobacco: Never Used  . Alcohol use No     Allergies   Byetta 10 mcg pen [exenatide]; Naproxen; Augmentin [amoxicillin-pot clavulanate]; Azithromycin; Erythromycin; Invokana [canagliflozin]; Morphine; and Sulfonamide derivatives   Review of Systems Review of Systems  Constitutional: Negative for fever.  Musculoskeletal: Positive for arthralgias and joint swelling. Negative for myalgias.  Neurological: Negative for weakness and numbness.     Physical Exam Updated Vital Signs BP (!) 135/96   Pulse 96   Temp 97.7 F (36.5 C) (Oral)   Resp 18   Ht 5' 2"  (1.575 m)   Wt 96.2 kg (212 lb)   SpO2 99%   BMI 38.78 kg/m   Physical Exam  Constitutional: She appears well-developed and well-nourished.  HENT:  Head: Atraumatic.  Neck: Normal range of motion.  Cardiovascular:  Pulses:      Dorsalis pedis pulses are 2+ on the right side, and 2+ on the left side.  Pulses equal bilaterally  Musculoskeletal: She exhibits edema and tenderness.       Left foot: There is bony tenderness and swelling. There is normal capillary refill and no deformity.  Early bruising noted across left proximal second third and fourth toes.  Distal sensation is intact.  Tender also crossed distal dorsal left foot without palpable deformity.  Neurological: She is alert. She has normal strength. She displays normal reflexes. No  sensory deficit.  Skin: Skin is warm and dry.  Psychiatric: She has a normal mood and affect.     ED Treatments / Results  Labs (all labs ordered are listed, but only abnormal results are displayed) Labs Reviewed - No data to display  EKG  EKG Interpretation None       Radiology Dg Foot Complete Left  Result Date: 07/24/2017 CLINICAL DATA:  Pain across toes and top of foot. EXAM: LEFT FOOT - COMPLETE 3+ VIEW COMPARISON:  None. FINDINGS: Cortical avulsion injury identified dorsal surface of the distal talus. Otherwise no acute fracture evident. No subluxation dislocation. No worrisome lytic or sclerotic osseous abnormality. IMPRESSION: Cortical avulsion injury involving the dorsal aspect of the distal talus. Electronically Signed   By: Misty Stanley M.D.   On: 07/24/2017 10:55    Procedures Procedures (including critical care time)  Medications Ordered in ED Medications  acetaminophen (TYLENOL) tablet 650 mg (650 mg Oral Given 07/24/17 1032)     Initial Impression / Assessment and Plan / ED Course  I have reviewed the triage vital signs and the nursing notes.  Pertinent labs & imaging results that were available during my care of the patient were reviewed by me and considered in my medical decision making (see chart for details).     RICE, f/u Dr. Aline Brochure (has seen in the past for carpal tunnel issues).  Final Clinical Impressions(s) / ED Diagnoses   Final diagnoses:  Closed nondisplaced avulsion fracture of  left talus, initial encounter    New Prescriptions New Prescriptions   No medications on file     Landis Martins 07/24/17 [place]    Milton Ferguson, MD 07/25/17 [phone]

## 2017-07-24 NOTE — Discharge Instructions (Signed)
As discussed, you have an avulsion fracture of the foot which suggests a tendon injury (often associated with a sprain).  Use ice and elevation as discussed for the next several days for pain and swelling.  Wear the postop shoe to protect  your foot.  Call Dr. Aline Brochure for further management of his injury.

## 2017-07-25 ENCOUNTER — Encounter: Payer: Self-pay | Admitting: Orthopaedic Surgery

## 2017-07-25 ENCOUNTER — Ambulatory Visit (INDEPENDENT_AMBULATORY_CARE_PROVIDER_SITE_OTHER): Payer: Self-pay | Admitting: Orthopaedic Surgery

## 2017-07-25 VITALS — BP 158/88 | HR 90 | Temp 98.5°F | Ht 62.0 in | Wt 218.0 lb

## 2017-07-25 DIAGNOSIS — S92155A Nondisplaced avulsion fracture (chip fracture) of left talus, initial encounter for closed fracture: Secondary | ICD-10-CM

## 2017-07-25 NOTE — Progress Notes (Signed)
Patient Jamie Harding, female DOB:1956/01/10, 61 y.o. JJO:[id number]  Chief Complaint  Patient presents with  . New Problem    left foot fracture DOI 11/25/99    HPI  Jamie Harding is a 61 y.o. female who took a misstep several days ago and hurt her left ankle.  She was seen in the ER on 07-24-17.  X-rays showed: IMPRESSION: Cortical avulsion injury involving the dorsal aspect of the distal talus.  She was given post op shoe and told to elevate her foot.  I have reviewed the ER records, x-rays and xray reports.  I have explained the findings and use of contrast baths. HPI  Body mass index is 39.87 kg/m.  ROS  Review of Systems  HENT: Negative for congestion.   Respiratory: Negative for cough and shortness of breath.   Cardiovascular: Negative for chest pain and leg swelling.  Endocrine: Positive for cold intolerance.  Musculoskeletal: Positive for arthralgias, gait problem and joint swelling.  Allergic/Immunologic: Positive for environmental allergies.  Psychiatric/Behavioral: The patient is nervous/anxious.     Past Medical History:  Diagnosis Date  . Anxiety   . Asthma   . Depression   . Essential hypertension   . Fatty liver   . Gastritis   . Gastroparesis   . GERD (gastroesophageal reflux disease)   . Hiatal hernia   . History of cardiac catheterization    Minimal coronary atherosclerosis February 2016  . History of kidney stones   . History of stroke 2008  . HLD (hyperlipidemia)   . Neuropathy   . Obesity   . Pancreatitis   . Rheumatoid arthritis(714.0)   . Tibialis tendinitis   . Type 2 diabetes mellitus ([place])     Past Surgical History:  Procedure Laterality Date  . ANKLE SURGERY     remove extra bone-left  . CARPAL TUNNEL RELEASE     right side  . CARPAL TUNNEL RELEASE Left 02/06/2013   Procedure: CARPAL TUNNEL RELEASE ;  Surgeon: Carole Civil, MD;  Location: [facility];  Service: Orthopedics;  Laterality: Left;  procedure end 1135  .  CARPAL TUNNEL RELEASE Right 09/14/2015   Procedure: RIGHT CARPAL TUNNEL RELEASE;  Surgeon: Carole Civil, MD;  Location: [facility];  Service: Orthopedics;  Laterality: Right;  . CARPAL TUNNEL RELEASE Left 09/28/2015   Procedure: LEFT CARPAL TUNNEL RELEASE;  Surgeon: Carole Civil, MD;  Location: [facility];  Service: Orthopedics;  Laterality: Left;  procedure 1  . CESAREAN SECTION    . CHOLECYSTECTOMY    . COLONOSCOPY WITH PROPOFOL  09/16/2012   UXN:ATFT sessile polyps ranging between 3-35m in size were found in the sigmoid colon and rectum; polypectomy was performed/Mild diverticulosis was noted throughout the entire examined colon/Small internal hemorrhoids  . DILATION AND CURETTAGE OF UTERUS     multiple  . ESOPHAGOGASTRODUODENOSCOPY  07/29/2009   Mild gastritis, benign path  . ESOPHAGOGASTRODUODENOSCOPY (EGD) WITH PROPOFOL  09/16/2012   SLF:Non-erosive gastritis (inflammation) was found; multiple bx/The duodenal mucosa showed no abnormalities in the ampulla and bulb and second portion of the duodenum/NAUSEA/VOMITING MOST LIKELY DUE TO GERD/GASTRITIS  . FINGER SURGERY Left    left little finger-otif of finger  . Gastric Emptying  07/27/2009   The amount of activity in the stomach at 120 minutes was 13% which is in the normal range  . LASER ABLATION OF THE CERVIX    . LEFT AND RIGHT HEART CATHETERIZATION WITH CORONARY ANGIOGRAM N/A 01/03/2015   Procedure: LEFT AND RIGHT HEART  CATHETERIZATION WITH CORONARY ANGIOGRAM;  Surgeon: Blane Ohara, MD;  Location: [facility] CATH LAB;  Service: Cardiovascular;  Laterality: N/A;  . LITHOTRIPSY    . POLYPECTOMY  09/16/2012   Procedure: POLYPECTOMY;  Surgeon: Danie Binder, MD;  Location: [facility];  Service: Endoscopy;  Laterality: N/A;  . SAVORY DILATION  09/16/2012   Procedure: SAVORY DILATION;  Surgeon: Danie Binder, MD;  Location: [facility];  Service: Endoscopy;  Laterality: N/A;  16 fr dilation  . TRIGGER FINGER RELEASE Left 02/06/2013   Procedure:  RELEASE TRIGGER FINGER LEFT LONG FINGER/A-1 PULLEY;  Surgeon: Carole Civil, MD;  Location: [facility];  Service: Orthopedics;  Laterality: Left;  procedure began 1136  . TRIGGER FINGER RELEASE Right 09/14/2015   Procedure: RIGHT LONG FINGER TRIGGER RELEASE;  Surgeon: Carole Civil, MD;  Location: [facility];  Service: Orthopedics;  Laterality: Right;  . TRIGGER FINGER RELEASE Left 09/28/2015   Procedure: LEFT RING TRIGGER FINGER RELEASE;  Surgeon: Carole Civil, MD;  Location: [facility];  Service: Orthopedics;  Laterality: Left;  left ring finger  . TUBAL LIGATION      Family History  Problem Relation Age of Onset  . Breast cancer Mother   . Diabetes Father   . Coronary artery disease Unknown   . Arthritis Unknown   . Asthma Unknown   . Diabetes Sister   . Colon cancer Neg Hx     Social History Social History  Substance Use Topics  . Smoking status: Former Smoker    Packs/day: 0.50    Years: 3.00    Types: Cigarettes    Quit date: 09/11/1987  . Smokeless tobacco: Never Used  . Alcohol use No    Allergies  Allergen Reactions  . Byetta 10 Mcg Pen [Exenatide] Diarrhea and Nausea And Vomiting      touch of pancreatis  . Naproxen Nausea And Vomiting  . Augmentin [Amoxicillin-Pot Clavulanate]     diarrhea  . Azithromycin Nausea And Vomiting and Rash  . Erythromycin Hives       . Invokana [Canagliflozin]     Urinary issues,yeast infection  . Morphine Hives and Rash  . Sulfonamide Derivatives Nausea And Vomiting and Rash    Current Outpatient Prescriptions  Medication Sig Dispense Refill  . albuterol (VENTOLIN HFA) 108 (90 Base) MCG/ACT inhaler INHALE 2 PUFFS INTO THE LUNGS EVERY 4 (FOUR) HOURS AS NEEDED FOR WHEEZING. 54 g 5  . amLODipine (NORVASC) 10 MG tablet Take 1 tablet (10 mg total) by mouth daily. 90 tablet 1  . aspirin EC 81 MG tablet Take 81 mg by mouth daily.    . Blood Glucose Monitoring Suppl (ONE TOUCH ULTRA SYSTEM KIT) W/DEVICE KIT 1 kit by Does not apply  route QID. Use as directed 4 x daily 1 each 0  . cephALEXin (KEFLEX) 500 MG capsule Take 1 capsule (500 mg total) by mouth 4 (four) times daily. 20 capsule 0  . cetirizine (ZYRTEC) 10 MG tablet Take 1 tablet (10 mg total) by mouth daily. 90 tablet 1  . cyanocobalamin 1000 MCG tablet Take 1,000 mcg by mouth daily. Reported on 02/02/2016    . Ergocalciferol (VITAMIN D2) 2000 UNITS TABS Take by mouth daily.    . fluconazole (DIFLUCAN) 150 MG tablet Take one tablet now and one tablet in one week 2 tablet 12  . fluticasone (FLONASE) 50 MCG/ACT nasal spray Place 2 sprays into both nostrils daily. 48 g 3  . Fluticasone-Salmeterol (ADVAIR DISKUS) 250-50 MCG/DOSE AEPB INHALE  1 PUFF BY MOUTH 2 TIMES DAILY. 60 each 5  . furosemide (LASIX) 20 MG tablet Take 1 tablet (20 mg total) by mouth daily. 90 tablet 1  . gabapentin (NEURONTIN) 300 MG capsule Two caps by mouth three times daily (Patient taking differently: Take 300 mg by mouth. 1 QAM and 2 QHS) 180 capsule 0  . glucose blood test strip Use as instructed 4 x daily One touch Ultra test strips 150 each 11  . HYDROcodone-acetaminophen (NORCO/VICODIN) 5-325 MG tablet Take 1 tablet by mouth every 6 (six) hours as needed for moderate pain. 120 tablet 0  . insulin detemir (LEVEMIR) 100 UNIT/ML injection 70 units in the am and 60 units qhs    . Insulin Pen Needle (UNIFINE PENTIPS) 31G X 8 MM MISC USE AS DIRECTED THREE TIMES A DAY AS NEEDED 300 each 5  . lisinopril (PRINIVIL,ZESTRIL) 20 MG tablet Take 1.5 tablets (30 mg total) by mouth daily. 135 tablet 3  . meloxicam (MOBIC) 15 MG tablet TAKE 1 TABLET BY MOUTH ONCE DAILY 90 tablet 0  . metFORMIN (GLUCOPHAGE) 1000 MG tablet Take 1 tablet (1,000 mg total) by mouth 2 (two) times daily. 180 tablet 1  . methocarbamol (ROBAXIN) 500 MG tablet Take 1 tablet (500 mg total) by mouth 2 (two) times daily as needed for muscle spasms. 60 tablet 5  . Multiple Vitamin (MULTIVITAMIN WITH MINERALS) TABS Take 1 tablet by mouth daily.     . mupirocin ointment (BACTROBAN) 2 % Apply 1 application topically 2 (two) times daily. 30 g 0  . nitrofurantoin, macrocrystal-monohydrate, (MACROBID) 100 MG capsule Take 1 capsule (100 mg total) by mouth 2 (two) times daily. 10 capsule 0  . NOVOLOG FLEXPEN 100 UNIT/ML FlexPen Inject 22 Units into the skin 3 (three) times daily with meals. 15 mL 0  . ondansetron (ZOFRAN) 8 MG tablet TAKE ONE TABLET BY MOUTH EVERY 12 HOURS AS NEEDED FOR NAUSEA 20 tablet 6  . pantoprazole (PROTONIX) 40 MG tablet Take 1 tablet (40 mg total) by mouth 2 (two) times daily. 180 tablet 1  . promethazine (PHENERGAN) 25 MG tablet TAKE 1 TABLET BY MOUTH EVERY 8 HOURS AS NEEDED FOR NAUSEA. 30 tablet 2  . promethazine (PHENERGAN) 25 MG tablet Take 1 tablet by mouth every 8 hours as needed for nausea. 30 tablet 3  . pyridOXINE (VITAMIN B-6) 100 MG tablet Take 100 mg by mouth daily. Reported on 02/02/2016    . rosuvastatin (CRESTOR) 10 MG tablet Take 1 tablet (10 mg total) by mouth daily. 90 tablet 1  . sitaGLIPtin (JANUVIA) 100 MG tablet Take 1 tablet (100 mg total) by mouth daily. 90 tablet 3  . venlafaxine (EFFEXOR) 75 MG tablet Take 1 tablet (75 mg total) by mouth 3 (three) times daily. 270 tablet 1   No current facility-administered medications for this visit.      Physical Exam  Blood pressure (!) 158/88, pulse 90, temperature 98.5 F (36.9 C), height 5' 2"  (1.575 m), weight 218 lb (98.9 kg).  Constitutional: overall normal hygiene, normal nutrition, well developed, normal grooming, normal body habitus. Assistive device:post op shoe  Musculoskeletal: gait and station Limp left, muscle tone and strength are normal, no tremors or atrophy is present.  .  Neurological: coordination overall normal.  Deep tendon reflex/nerve stretch intact.  Sensation normal.  Cranial nerves II-XII intact.   Skin:   Normal overall no scars, lesions, ulcers or rashes. No psoriasis.  Psychiatric: Alert and oriented x 3.  Recent  memory  intact, remote memory unclear.  Normal mood and affect. Well groomed.  Good eye contact.  Cardiovascular: overall no swelling, no varicosities, no edema bilaterally, normal temperatures of the legs and arms, no clubbing, cyanosis and good capillary refill.  Lymphatic: palpation is normal.  Left foot and ankle with swelling and dorsal ecchymosis.  NV intact.  ROM of ankle tender but full.  More pain dorsal ankle.  The patient has been educated about the nature of the problem(s) and counseled on treatment options.  The patient appeared to understand what I have discussed and is in agreement with it.  Encounter Diagnosis  Name Primary?  . Closed nondisplaced avulsion fracture of left talus, initial encounter Yes    PLAN Call if any problems.  Precautions discussed.  Continue current medications.   Return to clinic 2 weeks   CAM walker given.  Contrast bath sheet instructions given.  X-rays on return.  Electronically Signed Sanjuana Kava, MD 9/6/201811:08 AM

## 2017-07-27 ENCOUNTER — Other Ambulatory Visit: Payer: Self-pay | Admitting: Family Medicine

## 2017-07-29 NOTE — Telephone Encounter (Signed)
Last seen 03/14/17

## 2017-07-31 ENCOUNTER — Ambulatory Visit (INDEPENDENT_AMBULATORY_CARE_PROVIDER_SITE_OTHER): Payer: Self-pay | Admitting: Family Medicine

## 2017-07-31 ENCOUNTER — Encounter: Payer: Self-pay | Admitting: Family Medicine

## 2017-07-31 VITALS — BP 124/70 | Ht 62.0 in | Wt 221.0 lb

## 2017-07-31 DIAGNOSIS — I10 Essential (primary) hypertension: Secondary | ICD-10-CM

## 2017-07-31 DIAGNOSIS — E7849 Other hyperlipidemia: Secondary | ICD-10-CM

## 2017-07-31 DIAGNOSIS — E119 Type 2 diabetes mellitus without complications: Secondary | ICD-10-CM

## 2017-07-31 DIAGNOSIS — E784 Other hyperlipidemia: Secondary | ICD-10-CM

## 2017-07-31 DIAGNOSIS — G894 Chronic pain syndrome: Secondary | ICD-10-CM

## 2017-07-31 LAB — POCT GLYCOSYLATED HEMOGLOBIN (HGB A1C): Hemoglobin A1C: 7.7

## 2017-07-31 MED ORDER — VENLAFAXINE HCL 75 MG PO TABS: ORAL_TABLET | ORAL | 1 refills | Status: DC

## 2017-07-31 MED ORDER — PANTOPRAZOLE SODIUM 40 MG PO TBEC: 40.0000 mg | DELAYED_RELEASE_TABLET | Freq: Two times a day (BID) | ORAL | 3 refills | Status: DC

## 2017-07-31 MED ORDER — PANTOPRAZOLE SODIUM 40 MG PO TBEC: 40.0000 mg | DELAYED_RELEASE_TABLET | Freq: Every day | ORAL | 3 refills | Status: DC

## 2017-07-31 MED ORDER — GABAPENTIN 300 MG PO CAPS: ORAL_CAPSULE | ORAL | 3 refills | Status: DC

## 2017-07-31 MED ORDER — VENLAFAXINE HCL 75 MG PO TABS: ORAL_TABLET | ORAL | 5 refills | Status: DC

## 2017-07-31 MED ORDER — MELOXICAM 15 MG PO TABS: 15.0000 mg | ORAL_TABLET | Freq: Every day | ORAL | 3 refills | Status: DC

## 2017-07-31 MED ORDER — HYDROCODONE-ACETAMINOPHEN 5-325 MG PO TABS: 1.0000 | ORAL_TABLET | Freq: Four times a day (QID) | ORAL | 0 refills | Status: DC | PRN

## 2017-07-31 MED ORDER — METHOCARBAMOL 500 MG PO TABS: 500.0000 mg | ORAL_TABLET | Freq: Two times a day (BID) | ORAL | 1 refills | Status: DC | PRN

## 2017-07-31 NOTE — Patient Instructions (Signed)
Diabetes Mellitus and Food It is important for you to manage your blood sugar (glucose) level. Your blood glucose level can be greatly affected by what you eat. Eating healthier foods in the appropriate amounts throughout the day at about the same time each day will help you control your blood glucose level. It can also help slow or prevent worsening of your diabetes mellitus. Healthy eating may even help you improve the level of your blood pressure and reach or maintain a healthy weight. General recommendations for healthful eating and cooking habits include:  Eating meals and snacks regularly. Avoid going long periods of time without eating to lose weight.  Eating a diet that consists mainly of plant-based foods, such as fruits, vegetables, nuts, legumes, and whole grains.  Using low-heat cooking methods, such as baking, instead of high-heat cooking methods, such as deep frying.  Work with your dietitian to make sure you understand how to use the Nutrition Facts information on food labels. How can food affect me? Carbohydrates Carbohydrates affect your blood glucose level more than any other type of food. Your dietitian will help you determine how many carbohydrates to eat at each meal and teach you how to count carbohydrates. Counting carbohydrates is important to keep your blood glucose at a healthy level, especially if you are using insulin or taking certain medicines for diabetes mellitus. Alcohol Alcohol can cause sudden decreases in blood glucose (hypoglycemia), especially if you use insulin or take certain medicines for diabetes mellitus. Hypoglycemia can be a life-threatening condition. Symptoms of hypoglycemia (sleepiness, dizziness, and disorientation) are similar to symptoms of having too much alcohol. If your health care provider has given you approval to drink alcohol, do so in moderation and use the following guidelines:  Women should not have more than one drink per day, and men  should not have more than two drinks per day. One drink is equal to: ? 12 oz of beer. ? 5 oz of wine. ? 1 oz of hard liquor.  Do not drink on an empty stomach.  Keep yourself hydrated. Have water, diet soda, or unsweetened iced tea.  Regular soda, juice, and other mixers might contain a lot of carbohydrates and should be counted.  What foods are not recommended? As you make food choices, it is important to remember that all foods are not the same. Some foods have fewer nutrients per serving than other foods, even though they might have the same number of calories or carbohydrates. It is difficult to get your body what it needs when you eat foods with fewer nutrients. Examples of foods that you should avoid that are high in calories and carbohydrates but low in nutrients include:  Trans fats (most processed foods list trans fats on the Nutrition Facts label).  Regular soda.  Juice.  Candy.  Sweets, such as cake, pie, doughnuts, and cookies.  Fried foods.  What foods can I eat? Eat nutrient-rich foods, which will nourish your body and keep you healthy. The food you should eat also will depend on several factors, including:  The calories you need.  The medicines you take.  Your weight.  Your blood glucose level.  Your blood pressure level.  Your cholesterol level.  You should eat a variety of foods, including:  Protein. ? Lean cuts of meat. ? Proteins low in saturated fats, such as fish, egg whites, and beans. Avoid processed meats.  Fruits and vegetables. ? Fruits and vegetables that may help control blood glucose levels, such as apples,   mangoes, and yams.  Dairy products. ? Choose fat-free or low-fat dairy products, such as milk, yogurt, and cheese.  Grains, bread, pasta, and rice. ? Choose whole grain products, such as multigrain bread, whole oats, and brown rice. These foods may help control blood pressure.  Fats. ? Foods containing healthful fats, such as  nuts, avocado, olive oil, canola oil, and fish.  Does everyone with diabetes mellitus have the same meal plan? Because every person with diabetes mellitus is different, there is not one meal plan that works for everyone. It is very important that you meet with a dietitian who will help you create a meal plan that is just right for you. This information is not intended to replace advice given to you by your health care provider. Make sure you discuss any questions you have with your health care provider. Document Released: 08/02/2005 Document Revised: 04/12/2016 Document Reviewed: 10/02/2013 Elsevier Interactive Patient Education  2017 [employer]  

## 2017-07-31 NOTE — Progress Notes (Signed)
   Subjective:    Patient ID: Jamie Harding, female    DOB: October 12, 1956, 61 y.o.   MRN: [id number]  Diabetes  She presents for her follow-up diabetic visit. She has type 2 diabetes mellitus. Pertinent negatives for hypoglycemia include no confusion. Pertinent negatives for diabetes include no chest pain, no fatigue, no polydipsia, no polyphagia and no weakness. She is following a generally unhealthy diet. She rarely participates in exercise. Home blood sugar record trend: blood sugars over 200. She does not see a podiatrist.Eye exam is not current.   Results for orders placed or performed in visit on 07/31/17  POCT glycosylated hemoglobin (Hb A1C)  Result Value Ref Range   Hemoglobin A1C 7.7    Patient states that Protonix she uses once a day to help her with reflux keeps it under good control She uses a gabapentin help with the neuropathy in her feet Uses anti-inflammatories for back pain and knee pain Pain medication help with back and knee pain does decent job controlling it Does use her Effexor helps her with depression. He states things under good control. Needs to get rx to take to prescription rx assistance. Needs them printed out. Protonix, robaxin, gabapentin, mobic, effexor.     Review of Systems  Constitutional: Negative for activity change, appetite change and fatigue.  HENT: Negative for congestion.   Respiratory: Negative for cough.   Cardiovascular: Negative for chest pain.  Gastrointestinal: Negative for abdominal pain.  Endocrine: Negative for polydipsia and polyphagia.  Skin: Negative for color change.  Neurological: Negative for weakness.  Psychiatric/Behavioral: Negative for confusion.       Objective:   Physical Exam  Constitutional: She appears well-developed and well-nourished. No distress.  HENT:  Head: Normocephalic and atraumatic.  Eyes: Right eye exhibits no discharge. Left eye exhibits no discharge.  Neck: No tracheal deviation present.    Cardiovascular: Normal rate, regular rhythm and normal heart sounds.   No murmur heard. Pulmonary/Chest: Effort normal and breath sounds normal. No respiratory distress. She has no wheezes. She has no rales.  Musculoskeletal: She exhibits no edema.  Lymphadenopathy:    She has no cervical adenopathy.  Neurological: She is alert. She exhibits normal muscle tone.  Skin: Skin is warm and dry. No erythema.  Psychiatric: Her behavior is normal.  Vitals reviewed.    15 and 20 minutes spent with patient     Assessment & Plan:  Diabetes subpar control she will write down her sugar readings sent back to [name] may need to adjust her insulin do not add any new medicines currently  Blood pressure good control watch diet stay physically active  Chronic pain in the back without sciatica uses pain medicine does not abuse it drug registry was checked pain prescriptions given  Hyperlipidemia continue medication previous labs check  New labs ordered  Flu vaccine recommended patient will get this at the health department  Morbid obesity patient will try to lose weight watch diet

## 2017-08-04 ENCOUNTER — Encounter: Payer: Self-pay | Admitting: Family Medicine

## 2017-08-05 MED ORDER — DIPHENOXYLATE-ATROPINE 2.5-0.025 MG PO TABS: 1.0000 | ORAL_TABLET | Freq: Four times a day (QID) | ORAL | 0 refills | Status: DC | PRN

## 2017-08-05 NOTE — Addendum Note (Signed)
Addended by: Ofilia Neas R on: 08/05/2017 02:10 PM   Modules accepted: Orders

## 2017-08-05 NOTE — Telephone Encounter (Signed)
I would recommend that the patient try Lomotil, one 4 times a day when necessary diarrhea, #20 no refills, if diarrhea is not doing dramatically better over the next 48 hours she needs to be seen. Hold metformin until feeling well

## 2017-08-08 ENCOUNTER — Ambulatory Visit (INDEPENDENT_AMBULATORY_CARE_PROVIDER_SITE_OTHER): Payer: Self-pay

## 2017-08-08 ENCOUNTER — Encounter: Payer: Self-pay | Admitting: Orthopaedic Surgery

## 2017-08-08 ENCOUNTER — Ambulatory Visit (INDEPENDENT_AMBULATORY_CARE_PROVIDER_SITE_OTHER): Payer: Self-pay | Admitting: Orthopaedic Surgery

## 2017-08-08 DIAGNOSIS — S92155D Nondisplaced avulsion fracture (chip fracture) of left talus, subsequent encounter for fracture with routine healing: Secondary | ICD-10-CM

## 2017-08-08 NOTE — Progress Notes (Signed)
CC:  My ankle is better.  She has less pain of the left ankle.  She is using the CAM walker.  NV intact. ROM is good of the left ankle.  X-rays were done of the left ankle, reported separately.  Encounter Diagnosis  Name Primary?  . Closed nondisplaced avulsion fracture of left talus with routine healing, subsequent encounter Yes   Gradually come out of the CAM walker.  Return in three weeks.  X-rays on return.  Call if any problem.  Precautions discussed.    Electronically Signed Sanjuana Kava, MD 9/20/20182:35 PM

## 2017-08-22 ENCOUNTER — Other Ambulatory Visit: Payer: Self-pay

## 2017-08-22 ENCOUNTER — Encounter: Payer: Self-pay | Admitting: Orthopaedic Surgery

## 2017-08-28 ENCOUNTER — Ambulatory Visit (INDEPENDENT_AMBULATORY_CARE_PROVIDER_SITE_OTHER): Payer: Self-pay

## 2017-08-28 ENCOUNTER — Ambulatory Visit (INDEPENDENT_AMBULATORY_CARE_PROVIDER_SITE_OTHER): Payer: Self-pay | Admitting: Orthopaedic Surgery

## 2017-08-28 DIAGNOSIS — S92155D Nondisplaced avulsion fracture (chip fracture) of left talus, subsequent encounter for fracture with routine healing: Secondary | ICD-10-CM

## 2017-08-28 NOTE — Progress Notes (Signed)
CC:  My foot only hurts now and then.  I am much better  She has little pain of the left foot.  NV intact.  Gait is normal. ROM of the left ankle is full.  X-rays were done of the left foot, reported separately.  Encounter Diagnosis  Name Primary?  . Closed nondisplaced avulsion fracture of left talus with routine healing, subsequent encounter Yes   Discharge.  Call if any problem.  Precautions discussed.   Electronically Signed Sanjuana Kava, MD 10/10/201810:55 AM

## 2017-08-28 NOTE — Progress Notes (Signed)
This encounter was created in error - please disregard.

## 2017-09-19 ENCOUNTER — Encounter: Payer: Self-pay | Admitting: Family Medicine

## 2017-09-19 MED ORDER — ROSUVASTATIN CALCIUM 10 MG PO TABS: 10.0000 mg | ORAL_TABLET | Freq: Every day | ORAL | 1 refills | Status: DC

## 2017-09-19 MED ORDER — LISINOPRIL 20 MG PO TABS: 30.0000 mg | ORAL_TABLET | Freq: Every day | ORAL | 3 refills | Status: DC

## 2017-10-22 ENCOUNTER — Other Ambulatory Visit: Payer: Self-pay | Admitting: *Deleted

## 2017-10-22 MED ORDER — HYDROCODONE-ACETAMINOPHEN 5-325 MG PO TABS: 1.0000 | ORAL_TABLET | Freq: Four times a day (QID) | ORAL | 0 refills | Status: DC | PRN

## 2017-10-31 ENCOUNTER — Ambulatory Visit: Payer: Self-pay | Admitting: Family Medicine

## 2017-11-07 ENCOUNTER — Ambulatory Visit: Payer: Self-pay | Admitting: Orthopaedic Surgery

## 2017-11-22 ENCOUNTER — Encounter: Payer: Self-pay | Admitting: Family Medicine

## 2017-11-22 ENCOUNTER — Ambulatory Visit (INDEPENDENT_AMBULATORY_CARE_PROVIDER_SITE_OTHER): Payer: Self-pay | Admitting: Family Medicine

## 2017-11-22 VITALS — BP 118/72 | Ht 62.0 in | Wt 221.0 lb

## 2017-11-22 DIAGNOSIS — Z79899 Other long term (current) drug therapy: Secondary | ICD-10-CM

## 2017-11-22 DIAGNOSIS — G894 Chronic pain syndrome: Secondary | ICD-10-CM

## 2017-11-22 DIAGNOSIS — E119 Type 2 diabetes mellitus without complications: Secondary | ICD-10-CM

## 2017-11-22 DIAGNOSIS — I10 Essential (primary) hypertension: Secondary | ICD-10-CM

## 2017-11-22 DIAGNOSIS — Z79891 Long term (current) use of opiate analgesic: Secondary | ICD-10-CM

## 2017-11-22 DIAGNOSIS — E7849 Other hyperlipidemia: Secondary | ICD-10-CM

## 2017-11-22 LAB — POCT GLYCOSYLATED HEMOGLOBIN (HGB A1C): Hemoglobin A1C: 8.6

## 2017-11-22 MED ORDER — HYDROCODONE-ACETAMINOPHEN 5-325 MG PO TABS: 1.0000 | ORAL_TABLET | Freq: Four times a day (QID) | ORAL | 0 refills | Status: DC | PRN

## 2017-11-22 NOTE — Progress Notes (Signed)
Subjective:    Patient ID: Jamie Harding, female    DOB: 1956/07/03, 62 y.o.   MRN: [id number]  HPI This patient was seen today for chronic pain. Has pain in back and legs  The medication list was reviewed and updated.   -Compliance with medication: yes  - Number patient states they take daily: 3 -4   -when was the last dose patient took? today  The patient was advised the importance of maintaining medication and not using illegal substances with these.  Refills needed: yes  The patient was educated that we can provide 3 monthly scripts for their medication, it is their responsibility to follow the instructions.  Side effects or complications from medications: none  Patient is aware that pain medications are meant to minimize the severity of the pain to allow their pain levels to improve to allow for better function. They are aware of that pain medications cannot totally remove their pain.  Due for UDT ( at least once per year) : due today  Results for orders placed or performed in visit on 11/22/17  POCT glycosylated hemoglobin (Hb A1C)  Result Value Ref Range   Hemoglobin A1C 8.6     bilat sciatica daily, the patient is tried various things including physical therapy anti-inflammatories without help is necessary to use the pain medication in order to allow her to have better function  Blood pressure she watches her diet she tries to eat healthy takes her medicine as directed  Diabetes she states her numbers have been higher than normal because she is eating more starches and sugars she will get this under control and do more exercise  She tries to watch fats in her diet to keep her cholesterol under control she understands the importance of losing weight and keeping cholesterol under control to lessen the risk of heart attacks and strokes    Review of Systems  Constitutional: Negative for activity change, appetite change and fatigue.  HENT: Negative for congestion.     Respiratory: Negative for cough.   Cardiovascular: Negative for chest pain.  Gastrointestinal: Negative for abdominal pain.  Endocrine: Negative for polydipsia and polyphagia.  Skin: Negative for color change.  Neurological: Negative for weakness.  Psychiatric/Behavioral: Negative for confusion.       Objective:   Physical Exam  Constitutional: She appears well-developed and well-nourished. No distress.  HENT:  Head: Normocephalic and atraumatic.  Eyes: Right eye exhibits no discharge. Left eye exhibits no discharge.  Neck: No tracheal deviation present.  Cardiovascular: Normal rate, regular rhythm and normal heart sounds.  No murmur heard. Pulmonary/Chest: Effort normal and breath sounds normal. No respiratory distress. She has no wheezes. She has no rales.  Musculoskeletal: She exhibits no edema.  Lymphadenopathy:    She has no cervical adenopathy.  Neurological: She is alert. She exhibits normal muscle tone.  Skin: Skin is warm and dry. No erythema.  Psychiatric: Her behavior is normal.  Vitals reviewed.         Assessment & Plan:  Diabetes subpar control importance of getting this under better control she was send [name] some glucose readings and continue her medicines and follow-up in a few months  Blood pressure good control continue current measures watch diet closely try to lose weight  Chronic pain The patient was seen today as part of a comprehensive visit regarding pain control. Patient's compliance with the medication as well as discussion regarding effectiveness was completed. Prescriptions were written. Patient was advised to follow-up in  3 months. The patient was assessed for any signs of severe side effects. The patient was advised to take the medicine as directed and to report to [name] if any side effect issues.  Hyperlipidemia The patient was seen today as part of an evaluation regarding hyperlipidemia. Recent lab work has been reviewed with the patient as well  as the goals for good cholesterol care. In addition to this medications have been discussed the importance of compliance with diet and medications discussed as well. Patient has been informed of potential side effects of medications in the importance to notify us should any problems occur. Finally the patient is aware that poor control of cholesterol, noncompliance can dramatically increase her risk of heart attack strokes and premature death. The patient will keep regular office visits and the patient does agreed to periodic lab work.  Pain contract reviewed in detail patient voices understanding

## 2017-11-23 LAB — MICROALBUMIN / CREATININE URINE RATIO
CREATININE, UR: 119.3 mg/dL
MICROALB/CREAT RATIO: 21.1 mg/g{creat} (ref 0.0–30.0)
Microalbumin, Urine: 25.2 ug/mL

## 2017-11-28 LAB — TOXASSURE SELECT 13 (MW), URINE

## 2017-12-13 ENCOUNTER — Encounter: Payer: Self-pay | Admitting: Family Medicine

## 2018-01-21 ENCOUNTER — Telehealth: Payer: Self-pay | Admitting: Family Medicine

## 2018-01-21 ENCOUNTER — Encounter: Payer: Self-pay | Admitting: Family Medicine

## 2018-01-21 NOTE — Telephone Encounter (Signed)
Patient through a my chart message requested a letter, this letter was dictated.  Please mail to the patient.  Thank you

## 2018-01-24 ENCOUNTER — Other Ambulatory Visit: Payer: Self-pay | Admitting: Family Medicine

## 2018-01-28 ENCOUNTER — Encounter: Payer: Self-pay | Admitting: Family Medicine

## 2018-01-28 ENCOUNTER — Telehealth: Payer: Self-pay

## 2018-01-28 MED ORDER — CEPHALEXIN 500 MG PO CAPS: 500.0000 mg | ORAL_CAPSULE | Freq: Four times a day (QID) | ORAL | 0 refills | Status: AC

## 2018-01-28 NOTE — Telephone Encounter (Signed)
I spoke with the pt and she is aware we have sent in the medication to the pharmacy

## 2018-01-28 NOTE — Addendum Note (Signed)
Addended by: Karle Barr on: 01/28/2018 12:56 PM   Modules accepted: Orders

## 2018-01-28 NOTE — Telephone Encounter (Signed)
Please send in Keflex 500 mg 1 4 times daily for 5 days-follow-up if ongoing troubles

## 2018-01-28 NOTE — Telephone Encounter (Signed)
This was sent through my chart :Dr. Nicki Reaper. I have another UTI. I've been taking Azo but doesn't really help. Can you please call me in an antibiotic at [facility]. Thank you so much. I have an appointment with you on April 4. I called the pt and told her we normally do not just call in antibx sight unseen asked if she could come in she states she does not have the money to come in at this time.Burning and stinging with urination.Please advise.

## 2018-02-23 ENCOUNTER — Encounter: Payer: Self-pay | Admitting: Family Medicine

## 2018-02-24 ENCOUNTER — Encounter: Payer: Self-pay | Admitting: Family Medicine

## 2018-02-25 ENCOUNTER — Other Ambulatory Visit: Payer: Self-pay | Admitting: Family Medicine

## 2018-02-25 ENCOUNTER — Ambulatory Visit: Payer: Self-pay | Admitting: Family Medicine

## 2018-02-25 MED ORDER — HYDROCODONE-ACETAMINOPHEN 5-325 MG PO TABS: 1.0000 | ORAL_TABLET | Freq: Four times a day (QID) | ORAL | 0 refills | Status: DC | PRN

## 2018-03-04 ENCOUNTER — Encounter: Payer: Self-pay | Admitting: Family Medicine

## 2018-03-04 ENCOUNTER — Ambulatory Visit (INDEPENDENT_AMBULATORY_CARE_PROVIDER_SITE_OTHER): Payer: Self-pay | Admitting: Family Medicine

## 2018-03-04 VITALS — BP 132/86 | Ht 62.0 in | Wt 215.0 lb

## 2018-03-04 DIAGNOSIS — G894 Chronic pain syndrome: Secondary | ICD-10-CM

## 2018-03-04 DIAGNOSIS — I10 Essential (primary) hypertension: Secondary | ICD-10-CM

## 2018-03-04 DIAGNOSIS — K760 Fatty (change of) liver, not elsewhere classified: Secondary | ICD-10-CM

## 2018-03-04 DIAGNOSIS — R350 Frequency of micturition: Secondary | ICD-10-CM

## 2018-03-04 DIAGNOSIS — E1159 Type 2 diabetes mellitus with other circulatory complications: Secondary | ICD-10-CM

## 2018-03-04 DIAGNOSIS — E7849 Other hyperlipidemia: Secondary | ICD-10-CM

## 2018-03-04 LAB — POCT URINALYSIS DIPSTICK
GLUCOSE UA: 150
SPEC GRAV UA: 1.025 (ref 1.010–1.025)
pH, UA: 5 (ref 5.0–8.0)

## 2018-03-04 LAB — POCT GLYCOSYLATED HEMOGLOBIN (HGB A1C): HEMOGLOBIN A1C: 7.1

## 2018-03-04 MED ORDER — HYDROCODONE-ACETAMINOPHEN 5-325 MG PO TABS: 1.0000 | ORAL_TABLET | Freq: Four times a day (QID) | ORAL | 0 refills | Status: DC | PRN

## 2018-03-04 MED ORDER — CEPHALEXIN 500 MG PO CAPS: 500.0000 mg | ORAL_CAPSULE | Freq: Four times a day (QID) | ORAL | 0 refills | Status: AC

## 2018-03-04 NOTE — Progress Notes (Signed)
Subjective:    Patient ID: Jamie Harding, female    DOB: May 13, 1956, 62 y.o.   MRN: [id number]  Diabetes  She presents for her follow-up diabetic visit. She has type 2 diabetes mellitus. Hypoglycemia symptoms include hunger. Pertinent negatives for hypoglycemia include no confusion, headaches or mood changes. (Stated yesterday her sugar dropped within 3 hours of eating breakfast and it took a while to come up to a range where she felt better) Pertinent negatives for diabetes include no chest pain, no fatigue, no polydipsia, no polyphagia, no polyuria, no visual change and no weakness. Pertinent negatives for diabetic complications include no CVA or retinopathy. She has not had a previous visit with a dietitian. She does not see a podiatrist.Eye exam is not current.   This patient was seen today for chronic pain  The medication list was reviewed and updated.   -Compliance with medication:yes  - Number patient states they take daily: 3  -when was the last dose patient took? This morning around 9:30am  The patient was advised the importance of maintaining medication and not using illegal substances with these.  Here for refills and follow up  The patient was educated that we can provide 3 monthly scripts for their medication, it is their responsibility to follow the instructions.  Side effects or complications from medications: none  Patient is aware that pain medications are meant to minimize the severity of the pain to allow their pain levels to improve to allow for better function. They are aware of that pain medications cannot totally remove their pain.  Due for UDT ( at least once per year) : Nov 22 2017 Patient did not do her lab work on last visit mainly because she cannot afford to do so we talked about ways of trying to get this done less expensive and ordering only minimal necessary labs Patient for blood pressure check up. Patient relates compliance with meds. Todays BP  reviewed with the patient. Patient denies issues with medication. Patient relates reasonable diet. Patient tries to minimize salt. Patient aware of BP goals.  Patient does have ongoing trouble with reflux.  Takes medication on a regular basis.  Tries to minimize foods as best they can.  They understand the importance of dietary compliance.  May also try to avoid eating a large meal close to bedtime.  Patient denies any dysphagia denies hematochezia.  States medicine does a good job keeping the problem under good control.  Without the medication may certainly have issues.They desire to continue taking their medication.  Patient here for follow-up regarding cholesterol.  Patient does try to maintain a reasonable diet.  Patient does take the medication on a regular basis.  Denies missing a dose.  The patient denies any obvious side effects.  Prior blood work results reviewed with the patient.  The patient is aware of his cholesterol goals and the need to keep it under good control to lessen the risk of disease.   Results for orders placed or performed in visit on 03/04/18  POCT Urinalysis Dipstick  Result Value Ref Range   Color, UA     Clarity, UA     Glucose, UA 150    Bilirubin, UA     Ketones, UA     Spec Grav, UA 1.025 1.010 - 1.025   Blood, UA     pH, UA 5.0 5.0 - 8.0   Protein, UA     Urobilinogen, UA  0.2 or 1.0 E.U./dL   Nitrite,  UA     Leukocytes, UA  Negative   Appearance     Odor    POCT HgB A1C  Result Value Ref Range   Hemoglobin A1C 7.1       Review of Systems  Constitutional: Negative for activity change, appetite change and fatigue.  HENT: Negative for congestion.   Respiratory: Negative for cough.   Cardiovascular: Negative for chest pain.  Gastrointestinal: Negative for abdominal pain.  Endocrine: Negative for polydipsia, polyphagia and polyuria.  Skin: Negative for color change.  Neurological: Negative for weakness and headaches.  Psychiatric/Behavioral:  Negative for confusion.       Objective:   Physical Exam  Constitutional: She appears well-developed and well-nourished. No distress.  HENT:  Head: Normocephalic and atraumatic.  Eyes: Right eye exhibits no discharge. Left eye exhibits no discharge.  Neck: No tracheal deviation present.  Cardiovascular: Normal rate, regular rhythm and normal heart sounds.  No murmur heard. Pulmonary/Chest: Effort normal and breath sounds normal. No respiratory distress. She has no wheezes. She has no rales.  Musculoskeletal: She exhibits no edema.  Lymphadenopathy:    She has no cervical adenopathy.  Neurological: She is alert. She exhibits normal muscle tone.  Skin: Skin is warm and dry. No erythema.  Psychiatric: Her behavior is normal.  Vitals reviewed.         Assessment & Plan:  The patient was seen in followup for chronic pain. A review over at their current pain status was discussed. Discussion was held regarding the importance of compliance with medication as well as pain medication contract. Discussion with the patient regarding the medication as it pertains to allowing for blunting of pain levels as well as improvement of function was completed. Questions regarding the pain management were answered. Importance of regular followup visits was discussed. Patient was informed that medication may cause drowsiness and should not be combined  with other medications/alcohol or street drugs. Patient was cautioned that drowsiness may impair ability to operate heavy machinery/vehicles/dangerous activities and should take this into consideration.  Pain registry was checked  HTN- Patient was seen today as part of a visit regarding hypertension. The importance of healthy diet and regular physical activity was discussed. The importance of compliance with medications discussed.  Ideal goal is to keep blood pressure low elevated levels certainly below 694/85 when possible.  The patient was counseled  that keeping blood pressure under control lessen his risk of heart attack, stroke, kidney failure, and early death.  The importance of regular follow-ups was discussed with the patient.  Low-salt diet such as DASH recommended. Regular physical activity was recommended as well.  Patient was advised to keep regular follow-ups.  The patient was seen today as part of a comprehensive visit for diabetes. The importance of keeping her A1c at or below 7 was discussed.  Importance of regular physical activity was discussed.   The importance of adherence to medication as well as a controlled low starch/sugar diet was also discussed.  Standard follow-up visit recommended.  Finally failure to follow good diabetic measures including self effort and compliance with recommendations can certainly increase the risk of heart disease strokes kidney failure blindness loss of limb and early death was discussed with the patient.  The patient was seen today as part of an evaluation regarding hyperlipidemia.  Recent lab work has been reviewed with the patient as well as the goals for good cholesterol care.  In addition to this medications have been discussed the importance of compliance with  diet and medications discussed as well.  Finally the patient is aware that poor control of cholesterol, noncompliance can dramatically increase her risk of heart attack strokes and premature death.  The patient will keep regular office visits and the patient does agreed to periodic lab work.  Follow-up in approximately 3 months please do lab work  Patient with history of dysuria we checked urine urinary tract infection treat with Keflex 5 days

## 2018-03-11 ENCOUNTER — Telehealth: Payer: Self-pay | Admitting: Family Medicine

## 2018-03-11 NOTE — Telephone Encounter (Signed)
Please sign the Rx for pt's Pantoprazole & forward to me so that I may [facility]  In red folder in yellow box

## 2018-03-13 NOTE — Telephone Encounter (Signed)
Done. thanks

## 2018-03-17 NOTE — Telephone Encounter (Signed)
Faxed signed order for Pantoprazole [facility]  Sent order to be scanned & filed   Fx# [phone] Ph# [phone]

## 2018-04-30 ENCOUNTER — Other Ambulatory Visit: Payer: Self-pay | Admitting: Family Medicine

## 2018-06-09 ENCOUNTER — Other Ambulatory Visit: Payer: Self-pay | Admitting: Family Medicine

## 2018-06-09 ENCOUNTER — Encounter: Payer: Self-pay | Admitting: Family Medicine

## 2018-06-09 MED ORDER — FLUCONAZOLE 150 MG PO TABS: 150.0000 mg | ORAL_TABLET | Freq: Once | ORAL | 0 refills | Status: AC

## 2018-06-09 MED ORDER — NITROFURANTOIN MONOHYD MACRO 100 MG PO CAPS: ORAL_CAPSULE | ORAL | 0 refills | Status: DC

## 2018-06-09 NOTE — Telephone Encounter (Signed)
Please see my previous message

## 2018-06-09 NOTE — Telephone Encounter (Signed)
Medication sent in and patient notified. 

## 2018-06-09 NOTE — Telephone Encounter (Signed)
Called pt. She states she gets uti all the time. Last seen April 16th for uti. Pt states Saturday night she started having burning with urination, frequent urination, low abdominal pain/pressure. Pain on right side of low back but not bad, no fever, no hematuria. Taking azo.  Explained to pt usually dont call in antibiotics. No appt available today.   Also wanted something for yeast infection. White vaginal discharge and itching.   [place] [facility].

## 2018-06-09 NOTE — Telephone Encounter (Signed)
Given the current situation I would recommend Macrobid 100 mg 1 twice daily for 7 days, Diflucan 150 mg 1 p.o. x1, if ongoing troubles follow-up

## 2018-06-19 ENCOUNTER — Telehealth: Payer: Self-pay | Admitting: Family Medicine

## 2018-06-19 NOTE — Telephone Encounter (Signed)
Patient needing refill on pain medication .

## 2018-06-20 NOTE — Telephone Encounter (Signed)
I reviewed over this patient's information She had requested a prescription for pain medicine She got her last prescription on July 10 She should have enough to get her through to next Friday She is due for office visit for pain management This is standard and customary and required by the medical board I recommend we set her up for a pain management visit next week

## 2018-06-20 NOTE — Telephone Encounter (Signed)
Contacted patient and pt is setting up pain management visit with provider.

## 2018-06-23 ENCOUNTER — Encounter: Payer: Self-pay | Admitting: Family Medicine

## 2018-06-23 ENCOUNTER — Ambulatory Visit (INDEPENDENT_AMBULATORY_CARE_PROVIDER_SITE_OTHER): Payer: Self-pay | Admitting: Family Medicine

## 2018-06-23 VITALS — Ht 62.0 in

## 2018-06-23 DIAGNOSIS — M79605 Pain in left leg: Secondary | ICD-10-CM

## 2018-06-23 DIAGNOSIS — K3184 Gastroparesis: Secondary | ICD-10-CM

## 2018-06-23 DIAGNOSIS — I10 Essential (primary) hypertension: Secondary | ICD-10-CM

## 2018-06-23 DIAGNOSIS — E7849 Other hyperlipidemia: Secondary | ICD-10-CM

## 2018-06-23 DIAGNOSIS — K760 Fatty (change of) liver, not elsewhere classified: Secondary | ICD-10-CM

## 2018-06-23 DIAGNOSIS — R6883 Chills (without fever): Secondary | ICD-10-CM

## 2018-06-23 DIAGNOSIS — M545 Low back pain: Secondary | ICD-10-CM

## 2018-06-23 LAB — POCT URINALYSIS DIPSTICK
GLUCOSE UA: POSITIVE — AB
Protein, UA: POSITIVE — AB
pH, UA: 5 (ref 5.0–8.0)

## 2018-06-23 MED ORDER — HYDROCODONE-ACETAMINOPHEN 5-325 MG PO TABS: 1.0000 | ORAL_TABLET | Freq: Four times a day (QID) | ORAL | 0 refills | Status: DC | PRN

## 2018-06-23 MED ORDER — CEPHALEXIN 500 MG PO CAPS: 500.0000 mg | ORAL_CAPSULE | Freq: Four times a day (QID) | ORAL | 0 refills | Status: DC

## 2018-06-23 NOTE — Progress Notes (Signed)
Subjective:    Patient ID: Jamie Harding, female    DOB: 12/17/1955, 62 y.o.   MRN: [id number]  HPI This patient was seen today for chronic pain  The medication list was reviewed and updated.   -Compliance with medication: yes  - Number patient states they take daily: 4  -when was the last dose patient took? today  The patient was advised the importance of maintaining medication and not using illegal substances with these.  Here for refills and follow up  The patient was educated that we can provide 3 monthly scripts for their medication, it is their responsibility to follow the instructions.  Side effects or complications from medications: none  Patient is aware that pain medications are meant to minimize the severity of the pain to allow their pain levels to improve to allow for better function. They are aware of that pain medications cannot totally remove their pain.  Due for UDT ( at least once per year) : 11/2017  Patient has history of gastroparesis has had some intermittent vomiting recently  Past couple days has had some chills but no abdominal pain she has history of UTIs history of diabetes hyperlipidemia moderate obesity patient denies tick bites denies rashes denies muscle aches  Patient has chronic back pain with sciatica worse down the left leg pain medication does help helps her function better drug registry was checked      Review of Systems  Constitutional: Negative for activity change, appetite change and fatigue.  HENT: Negative for congestion and rhinorrhea.   Respiratory: Negative for cough and shortness of breath.   Cardiovascular: Negative for chest pain and leg swelling.  Gastrointestinal: Negative for abdominal pain and diarrhea.  Endocrine: Negative for polydipsia and polyphagia.  Skin: Negative for color change.  Neurological: Negative for dizziness and weakness.  Psychiatric/Behavioral: Negative for behavioral problems and confusion.      Objective:   Physical Exam  Constitutional: She appears well-nourished. No distress.  HENT:  Head: Normocephalic and atraumatic.  Eyes: Right eye exhibits no discharge. Left eye exhibits no discharge.  Neck: No tracheal deviation present.  Cardiovascular: Normal rate, regular rhythm and normal heart sounds.  No murmur heard. Pulmonary/Chest: Effort normal and breath sounds normal. No respiratory distress.  Musculoskeletal: She exhibits no edema.  Lymphadenopathy:    She has no cervical adenopathy.  Neurological: She is alert. Coordination normal.  Skin: Skin is warm and dry.  Psychiatric: She has a normal mood and affect. Her behavior is normal.  Vitals reviewed.         Assessment & Plan:  The patient was seen in followup for chronic pain. A review over at their current pain status was discussed. Drug registry was checked. Prescriptions were given. Discussion was held regarding the importance of compliance with medication as well as pain medication contract.  Time for questions regarding pain management plan occurred. Importance of regular followup visits was discussed. Patient was informed that medication may cause drowsiness and should not be combined  with other medications/alcohol or street drugs. Patient was cautioned that medication could cause drowsiness. If the patient feels medication is causing altered alertness then do not drive or operate dangerous equipment.  Patient with fatty liver obesity and gastroparesis patient was encouraged to eat healthy maintain activity as possible at  Recent chills urinalysis does show some WBCs we will send for culture go ahead and start antibiotics I doubt tick related illness but if patient gets worse we will need to do  lab work  Comprehensive lab work later this year

## 2018-06-25 ENCOUNTER — Telehealth: Payer: Self-pay | Admitting: *Deleted

## 2018-06-25 ENCOUNTER — Encounter: Payer: Self-pay | Admitting: Family Medicine

## 2018-06-25 ENCOUNTER — Other Ambulatory Visit: Payer: Self-pay | Admitting: *Deleted

## 2018-06-25 DIAGNOSIS — R109 Unspecified abdominal pain: Secondary | ICD-10-CM

## 2018-06-25 LAB — URINE CULTURE

## 2018-06-25 MED ORDER — DOXYCYCLINE HYCLATE 100 MG PO TABS: 100.0000 mg | ORAL_TABLET | Freq: Two times a day (BID) | ORAL | 0 refills | Status: DC

## 2018-06-25 NOTE — Telephone Encounter (Signed)
Please see result note on pt.

## 2018-06-25 NOTE — Telephone Encounter (Signed)
Please see result note for changes in antibiotics, explanation to give to the patient, lab work to be ordered

## 2018-06-25 NOTE — Telephone Encounter (Signed)
I called pt and got more info. Added her message to her result note and sent to dr scott. Please see result note for info.

## 2018-06-25 NOTE — Telephone Encounter (Signed)
Discussed with pt

## 2018-06-30 ENCOUNTER — Other Ambulatory Visit: Payer: Self-pay | Admitting: Family Medicine

## 2018-07-01 LAB — HEPATIC FUNCTION PANEL
AG Ratio: 1.1 (calc) (ref 1.0–2.5)
ALT: 25 U/L (ref 6–29)
AST: 23 U/L (ref 10–35)
Albumin: 3.7 g/dL (ref 3.6–5.1)
Alkaline phosphatase (APISO): 73 U/L (ref 33–130)
Bilirubin, Direct: 0.1 mg/dL (ref 0.0–0.2)
Globulin: 3.5 g/dL (ref 1.9–3.7)
Indirect Bilirubin: 0.2 mg/dL (ref 0.2–1.2)
Total Bilirubin: 0.3 mg/dL (ref 0.2–1.2)
Total Protein: 7.2 g/dL (ref 6.1–8.1)

## 2018-07-01 LAB — CBC WITH DIFFERENTIAL/PLATELET
BASOS ABS: 117 {cells}/uL (ref 0–200)
Basophils Relative: 1.3 %
EOS ABS: 288 {cells}/uL (ref 15–500)
Eosinophils Relative: 3.2 %
HEMATOCRIT: 43.1 % (ref 35.0–45.0)
HEMOGLOBIN: 14.1 g/dL (ref 11.7–15.5)
LYMPHS ABS: 3915 {cells}/uL — AB (ref 850–3900)
MCH: 28.7 pg (ref 27.0–33.0)
MCHC: 32.7 g/dL (ref 32.0–36.0)
MCV: 87.6 fL (ref 80.0–100.0)
MONOS PCT: 4.7 %
MPV: 9.8 fL (ref 7.5–12.5)
NEUTROS ABS: 4257 {cells}/uL (ref 1500–7800)
NEUTROS PCT: 47.3 %
Platelets: 351 10*3/uL (ref 140–400)
RBC: 4.92 10*6/uL (ref 3.80–5.10)
RDW: 11.9 % (ref 11.0–15.0)
Total Lymphocyte: 43.5 %
WBC mixed population: 423 cells/uL (ref 200–950)
WBC: 9 10*3/uL (ref 3.8–10.8)

## 2018-07-01 LAB — BASIC METABOLIC PANEL WITH GFR
BUN: 14 mg/dL (ref 7–25)
CO2: 26 mmol/L (ref 20–32)
CREATININE: 0.9 mg/dL (ref 0.50–1.05)
Calcium: 10.2 mg/dL (ref 8.6–10.4)
Chloride: 103 mmol/L (ref 98–110)
GFR, EST AFRICAN AMERICAN: 81 mL/min/{1.73_m2} (ref >=60)
GFR, EST NON AFRICAN AMERICAN: 70 mL/min/{1.73_m2} (ref >=60)
Glucose, Bld: 253 mg/dL — ABNORMAL HIGH (ref 65–139)
POTASSIUM: 4.2 mmol/L (ref 3.5–5.3)
SODIUM: 138 mmol/L (ref 135–146)

## 2018-07-04 ENCOUNTER — Ambulatory Visit: Payer: Self-pay | Admitting: Family Medicine

## 2018-07-28 ENCOUNTER — Other Ambulatory Visit: Payer: Self-pay | Admitting: Family Medicine

## 2018-09-22 ENCOUNTER — Encounter: Payer: Self-pay | Admitting: Family Medicine

## 2018-09-22 ENCOUNTER — Ambulatory Visit: Payer: Self-pay | Admitting: Family Medicine

## 2018-09-22 VITALS — BP 118/72 | Ht 62.0 in | Wt 214.8 lb

## 2018-09-22 DIAGNOSIS — M79605 Pain in left leg: Secondary | ICD-10-CM

## 2018-09-22 DIAGNOSIS — Z79891 Long term (current) use of opiate analgesic: Secondary | ICD-10-CM

## 2018-09-22 DIAGNOSIS — M545 Low back pain, unspecified: Secondary | ICD-10-CM

## 2018-09-22 DIAGNOSIS — E119 Type 2 diabetes mellitus without complications: Secondary | ICD-10-CM

## 2018-09-22 LAB — POCT GLYCOSYLATED HEMOGLOBIN (HGB A1C): Hemoglobin A1C: 9.2 % — AB (ref 4.0–5.6)

## 2018-09-22 MED ORDER — VENLAFAXINE HCL 75 MG PO TABS: ORAL_TABLET | ORAL | 5 refills | Status: DC

## 2018-09-22 MED ORDER — HYDROCODONE-ACETAMINOPHEN 5-325 MG PO TABS: 1.0000 | ORAL_TABLET | Freq: Four times a day (QID) | ORAL | 0 refills | Status: DC | PRN

## 2018-09-22 MED ORDER — HYDROCODONE-ACETAMINOPHEN 5-325 MG PO TABS: ORAL_TABLET | ORAL | 0 refills | Status: DC

## 2018-09-22 NOTE — Patient Instructions (Signed)
Diabetes Mellitus and Nutrition When you have diabetes (diabetes mellitus), it is very important to have healthy eating habits because your blood sugar (glucose) levels are greatly affected by what you eat and drink. Eating healthy foods in the appropriate amounts, at about the same times every day, can help you:  Control your blood glucose.  Lower your risk of heart disease.  Improve your blood pressure.  Reach or maintain a healthy weight.  Every person with diabetes is different, and each person has different needs for a meal plan. Your health care provider may recommend that you work with a diet and nutrition specialist (dietitian) to make a meal plan that is best for you. Your meal plan may vary depending on factors such as:  The calories you need.  The medicines you take.  Your weight.  Your blood glucose, blood pressure, and cholesterol levels.  Your activity level.  Other health conditions you have, such as heart or kidney disease.  How do carbohydrates affect me? Carbohydrates affect your blood glucose level more than any other type of food. Eating carbohydrates naturally increases the amount of glucose in your blood. Carbohydrate counting is a method for keeping track of how many carbohydrates you eat. Counting carbohydrates is important to keep your blood glucose at a healthy level, especially if you use insulin or take certain oral diabetes medicines. It is important to know how many carbohydrates you can safely have in each meal. This is different for every person. Your dietitian can help you calculate how many carbohydrates you should have at each meal and for snack. Foods that contain carbohydrates include:  Bread, cereal, rice, pasta, and crackers.  Potatoes and corn.  Peas, beans, and lentils.  Milk and yogurt.  Fruit and juice.  Desserts, such as cakes, cookies, ice cream, and candy.  How does alcohol affect me? Alcohol can cause a sudden decrease in blood  glucose (hypoglycemia), especially if you use insulin or take certain oral diabetes medicines. Hypoglycemia can be a life-threatening condition. Symptoms of hypoglycemia (sleepiness, dizziness, and confusion) are similar to symptoms of having too much alcohol. If your health care provider says that alcohol is safe for you, follow these guidelines:  Limit alcohol intake to no more than 1 drink per day for nonpregnant women and 2 drinks per day for men. One drink equals 12 oz of beer, 5 oz of wine, or 1 oz of hard liquor.  Do not drink on an empty stomach.  Keep yourself hydrated with water, diet soda, or unsweetened iced tea.  Keep in mind that regular soda, juice, and other mixers may contain a lot of sugar and must be counted as carbohydrates.  What are tips for following this plan? Reading food labels  Start by checking the serving size on the label. The amount of calories, carbohydrates, fats, and other nutrients listed on the label are based on one serving of the food. Many foods contain more than one serving per package.  Check the total grams (g) of carbohydrates in one serving. You can calculate the number of servings of carbohydrates in one serving by dividing the total carbohydrates by 15. For example, if a food has 30 g of total carbohydrates, it would be equal to 2 servings of carbohydrates.  Check the number of grams (g) of saturated and trans fats in one serving. Choose foods that have low or no amount of these fats.  Check the number of milligrams (mg) of sodium in one serving. Most people   should limit total sodium intake to less than 2,300 mg per day.  Always check the nutrition information of foods labeled as "low-fat" or "nonfat". These foods may be higher in added sugar or refined carbohydrates and should be avoided.  Talk to your dietitian to identify your daily goals for nutrients listed on the label. Shopping  Avoid buying canned, premade, or processed foods. These  foods tend to be high in fat, sodium, and added sugar.  Shop around the outside edge of the grocery store. This includes fresh fruits and vegetables, bulk grains, fresh meats, and fresh dairy. Cooking  Use low-heat cooking methods, such as baking, instead of high-heat cooking methods like deep frying.  Cook using healthy oils, such as olive, canola, or sunflower oil.  Avoid cooking with butter, cream, or high-fat meats. Meal planning  Eat meals and snacks regularly, preferably at the same times every day. Avoid going long periods of time without eating.  Eat foods high in fiber, such as fresh fruits, vegetables, beans, and whole grains. Talk to your dietitian about how many servings of carbohydrates you can eat at each meal.  Eat 4-6 ounces of lean protein each day, such as lean meat, chicken, fish, eggs, or tofu. 1 ounce is equal to 1 ounce of meat, chicken, or fish, 1 egg, or 1/4 cup of tofu.  Eat some foods each day that contain healthy fats, such as avocado, nuts, seeds, and fish. Lifestyle   Check your blood glucose regularly.  Exercise at least 30 minutes 5 or more days each week, or as told by your health care provider.  Take medicines as told by your health care provider.  Do not use any products that contain nicotine or tobacco, such as cigarettes and e-cigarettes. If you need help quitting, ask your health care provider.  Work with a counselor or diabetes educator to identify strategies to manage stress and any emotional and social challenges. What are some questions to ask my health care provider?  Do I need to meet with a diabetes educator?  Do I need to meet with a dietitian?  What number can I call if I have questions?  When are the best times to check my blood glucose? Where to find more information:  American Diabetes Association: diabetes.org/food-and-fitness/food  Academy of Nutrition and Dietetics:  www.eatright.org/resources/health/diseases-and-conditions/diabetes  National Institute of Diabetes and Digestive and Kidney Diseases (NIH): www.niddk.nih.gov/health-information/diabetes/overview/diet-eating-physical-activity Summary  A healthy meal plan will help you control your blood glucose and maintain a healthy lifestyle.  Working with a diet and nutrition specialist (dietitian) can help you make a meal plan that is best for you.  Keep in mind that carbohydrates and alcohol have immediate effects on your blood glucose levels. It is important to count carbohydrates and to use alcohol carefully. This information is not intended to replace advice given to you by your health care provider. Make sure you discuss any questions you have with your health care provider. Document Released: 08/02/2005 Document Revised: 12/10/2016 Document Reviewed: 12/10/2016 Elsevier Interactive Patient Education  2018 [employer] Inc[employer]  

## 2018-09-22 NOTE — Progress Notes (Signed)
Subjective:    Patient ID: Jamie Harding, female    DOB: Feb 08, 1956, 62 y.o.   MRN: [id number]  HPI This patient was seen today for chronic pain  The medication list was reviewed and updated.   -Compliance with medication: takes 4 a day   - Number patient states they take daily: 4 a day  -when was the last dose patient took? Today   The patient was advised the importance of maintaining medication and not using illegal substances with these.  Here for refills and follow up  The patient was educated that we can provide 3 monthly scripts for their medication, it is their responsibility to follow the instructions.  Side effects or complications from medications: none  Patient is aware that pain medications are meant to minimize the severity of the pain to allow their pain levels to improve to allow for better function. They are aware of that pain medications cannot totally remove their pain.  Due for UDT ( at least once per year) : last one jan 2019  Needs pain meds to go to [place] apoth and needs refill on effexor to go to [facility] [employer].   Declines flu vaccine.   Results for orders placed or performed in visit on 09/22/18  POCT glycosylated hemoglobin (Hb A1C)  Result Value Ref Range   Hemoglobin A1C 9.2 (A) 4.0 - 5.6 %   HbA1c POC (<> result, manual entry)     HbA1c, POC (prediabetic range)     HbA1c, POC (controlled diabetic range)          Review of Systems  Constitutional: Negative for activity change, appetite change and fatigue.  HENT: Negative for congestion and rhinorrhea.   Respiratory: Negative for cough and shortness of breath.   Cardiovascular: Negative for chest pain and leg swelling.  Gastrointestinal: Negative for abdominal pain and diarrhea.  Endocrine: Negative for polydipsia and polyphagia.  Skin: Negative for color change.  Neurological: Negative for dizziness and weakness.  Psychiatric/Behavioral: Negative for behavioral problems and  confusion.       Objective:   Physical Exam  Constitutional: She appears well-nourished. No distress.  HENT:  Head: Normocephalic and atraumatic.  Eyes: Right eye exhibits no discharge. Left eye exhibits no discharge.  Neck: No tracheal deviation present.  Cardiovascular: Normal rate, regular rhythm and normal heart sounds.  No murmur heard. Pulmonary/Chest: Effort normal and breath sounds normal. No respiratory distress.  Musculoskeletal: She exhibits no edema.  Lymphadenopathy:    She has no cervical adenopathy.  Neurological: She is alert. Coordination normal.  Skin: Skin is warm and dry.  Psychiatric: She has a normal mood and affect. Her behavior is normal.  Vitals reviewed.         Assessment & Plan:  Very nice patient under difficult circumstances Patient does not have insurance so it makes it difficult for her to do everything she needs to do She goes on Medicare come January and will be able to get her female health checkup and other health aspects done  The patient was seen in followup for chronic pain. A review over at their current pain status was discussed. Drug registry was checked. Prescriptions were given. Discussion was held regarding the importance of compliance with medication as well as pain medication contract.  Time for questions regarding pain management plan occurred. Importance of regular followup visits was discussed. Patient was informed that medication may cause drowsiness and should not be combined  with other medications/alcohol or street drugs. Patient  was cautioned that medication could cause drowsiness. If the patient feels medication is causing altered alertness then do not drive or operate dangerous equipment.  Drug registry was checked  Diabetes subpar control we talked about diet activity patient will check her sugars she will send [name] readings  Depression-patient was encouraged to stick with her medicines to try as best as possible to  get out more often patient not suicidal she will look into volunteering at nursing homes  Follow-up 3 months

## 2018-10-14 ENCOUNTER — Encounter: Payer: Self-pay | Admitting: Family Medicine

## 2018-10-29 ENCOUNTER — Ambulatory Visit: Payer: Self-pay | Admitting: *Deleted

## 2018-10-29 DIAGNOSIS — Z23 Encounter for immunization: Secondary | ICD-10-CM

## 2018-11-25 ENCOUNTER — Encounter: Payer: Self-pay | Admitting: Family Medicine

## 2018-11-26 MED ORDER — AMLODIPINE BESYLATE 10 MG PO TABS: 10.0000 mg | ORAL_TABLET | Freq: Every day | ORAL | 0 refills | Status: DC

## 2018-11-26 MED ORDER — VENLAFAXINE HCL 75 MG PO TABS: ORAL_TABLET | ORAL | 0 refills | Status: DC

## 2018-11-26 MED ORDER — PANTOPRAZOLE SODIUM 40 MG PO TBEC: 40.0000 mg | DELAYED_RELEASE_TABLET | Freq: Every day | ORAL | 0 refills | Status: DC

## 2018-11-26 MED ORDER — ONDANSETRON HCL 8 MG PO TABS: ORAL_TABLET | ORAL | 0 refills | Status: DC

## 2018-11-26 MED ORDER — METFORMIN HCL 1000 MG PO TABS: 1000.0000 mg | ORAL_TABLET | Freq: Two times a day (BID) | ORAL | 0 refills | Status: DC

## 2018-11-26 MED ORDER — LISINOPRIL 20 MG PO TABS: 30.0000 mg | ORAL_TABLET | Freq: Every day | ORAL | 0 refills | Status: DC

## 2018-11-26 MED ORDER — ROSUVASTATIN CALCIUM 10 MG PO TABS: 10.0000 mg | ORAL_TABLET | Freq: Every day | ORAL | 0 refills | Status: DC

## 2018-11-26 MED ORDER — PROMETHAZINE HCL 25 MG PO TABS: ORAL_TABLET | ORAL | 0 refills | Status: DC

## 2018-11-26 NOTE — Addendum Note (Signed)
Addended by: Dairl Ponder on: 11/26/2018 10:53 AM   Modules accepted: Orders

## 2018-11-26 NOTE — Telephone Encounter (Signed)
Patient may have 1 month prescription on her request of medicines She will need to keep her follow-up office visit Please send her notification that this was completed via my chart thank you

## 2018-12-19 ENCOUNTER — Ambulatory Visit (INDEPENDENT_AMBULATORY_CARE_PROVIDER_SITE_OTHER): Payer: PPO | Admitting: Family Medicine

## 2018-12-19 ENCOUNTER — Encounter: Payer: Self-pay | Admitting: Family Medicine

## 2018-12-19 VITALS — BP 122/84 | Ht 62.0 in | Wt 210.0 lb

## 2018-12-19 DIAGNOSIS — E7849 Other hyperlipidemia: Secondary | ICD-10-CM

## 2018-12-19 DIAGNOSIS — E1159 Type 2 diabetes mellitus with other circulatory complications: Secondary | ICD-10-CM | POA: Diagnosis not present

## 2018-12-19 DIAGNOSIS — Z79891 Long term (current) use of opiate analgesic: Secondary | ICD-10-CM

## 2018-12-19 DIAGNOSIS — I10 Essential (primary) hypertension: Secondary | ICD-10-CM

## 2018-12-19 MED ORDER — VENLAFAXINE HCL 75 MG PO TABS: ORAL_TABLET | ORAL | 5 refills | Status: DC

## 2018-12-19 MED ORDER — AMLODIPINE BESYLATE 10 MG PO TABS: 10.0000 mg | ORAL_TABLET | Freq: Every day | ORAL | 5 refills | Status: DC

## 2018-12-19 MED ORDER — HYDROCODONE-ACETAMINOPHEN 5-325 MG PO TABS: ORAL_TABLET | ORAL | 0 refills | Status: DC

## 2018-12-19 MED ORDER — HYDROCODONE-ACETAMINOPHEN 5-325 MG PO TABS: 1.0000 | ORAL_TABLET | Freq: Four times a day (QID) | ORAL | 0 refills | Status: DC | PRN

## 2018-12-19 MED ORDER — SITAGLIPTIN PHOSPHATE 100 MG PO TABS: 100.0000 mg | ORAL_TABLET | Freq: Every day | ORAL | 1 refills | Status: DC

## 2018-12-19 MED ORDER — ZOSTER VAC RECOMB ADJUVANTED 50 MCG/0.5ML IM SUSR: 0.5000 mL | Freq: Once | INTRAMUSCULAR | 1 refills | Status: AC

## 2018-12-19 MED ORDER — ALBUTEROL SULFATE HFA 108 (90 BASE) MCG/ACT IN AERS: INHALATION_SPRAY | RESPIRATORY_TRACT | 5 refills | Status: DC

## 2018-12-19 MED ORDER — ZOSTER VAC RECOMB ADJUVANTED 50 MCG/0.5ML IM SUSR: 0.5000 mL | Freq: Once | INTRAMUSCULAR | 1 refills | Status: DC

## 2018-12-19 MED ORDER — ROSUVASTATIN CALCIUM 10 MG PO TABS: 10.0000 mg | ORAL_TABLET | Freq: Every day | ORAL | 5 refills | Status: DC

## 2018-12-19 NOTE — Progress Notes (Signed)
Subjective:    Patient ID: Jamie Harding, female    DOB: 06-13-1956, 63 y.o.   MRN: [id number]  HPI  This patient was seen today for chronic pain  The medication list was reviewed and updated.   -Compliance with medication: yes  - Number patient states they take daily: 3-4  -when was the last dose patient took? this am  The patient was advised the importance of maintaining medication and not using illegal substances with these.  Here for refills and follow up  The patient was educated that we can provide 3 monthly scripts for their medication, it is their responsibility to follow the instructions.  Side effects or complications from medications:   Patient is aware that pain medications are meant to minimize the severity of the pain to allow their pain levels to improve to allow for better function. They are aware of that pain medications cannot totally remove their pain.  Due for UDT ( at least once per year) : today  The patient was seen today as part of a comprehensive diabetic check up.the patient does have diabetes.  The patient follows here on a regular basis.  The patient relates medication compliance. No significant side effects to the medications. Denies any low glucose spells. Relates compliance with diet to a reasonable level. Patient does do labwork intermittently and understands the dangers of diabetes.   Patient here for follow-up regarding cholesterol.  The patient does have hyperlipidemia.  Patient does try to maintain a reasonable diet.  Patient does take the medication on a regular basis.  Denies missing a dose.  The patient denies any obvious side effects.  Prior blood work results reviewed with the patient.  The patient is aware of his cholesterol goals and the need to keep it under good control to lessen the risk of disease.  Patient for blood pressure check up.  The patient does have hypertension.  The patient is on medication.  Patient relates compliance with  meds. Todays BP reviewed with the patient. Patient denies issues with medication. Patient relates reasonable diet. Patient tries to minimize salt. Patient aware of BP goals.       Review of Systems  Constitutional: Negative for activity change, appetite change and fatigue.  HENT: Negative for congestion and rhinorrhea.   Respiratory: Negative for cough and shortness of breath.   Cardiovascular: Negative for chest pain and leg swelling.  Gastrointestinal: Negative for abdominal pain and diarrhea.  Endocrine: Negative for polydipsia and polyphagia.  Skin: Negative for color change.  Neurological: Negative for dizziness and weakness.  Psychiatric/Behavioral: Negative for behavioral problems and confusion.       Objective:   Physical Exam Vitals signs reviewed.  Constitutional:      General: She is not in acute distress. HENT:     Head: Normocephalic and atraumatic.  Eyes:     General:        Right eye: No discharge.        Left eye: No discharge.  Neck:     Trachea: No tracheal deviation.  Cardiovascular:     Rate and Rhythm: Normal rate and regular rhythm.     Heart sounds: Normal heart sounds. No murmur.  Pulmonary:     Effort: Pulmonary effort is normal. No respiratory distress.     Breath sounds: Normal breath sounds.  Lymphadenopathy:     Cervical: No cervical adenopathy.  Skin:    General: Skin is warm and dry.  Neurological:  Mental Status: She is alert.     Coordination: Coordination normal.  Psychiatric:        Behavior: Behavior normal.           Assessment & Plan:  HTN- Patient was seen today as part of a visit regarding hypertension. The importance of healthy diet and regular physical activity was discussed. The importance of compliance with medications discussed.  Ideal goal is to keep blood pressure low elevated levels certainly below 258/52 when possible.  The patient was counseled that keeping blood pressure under control lessen his risk of  complications.  The importance of regular follow-ups was discussed with the patient.  Low-salt diet such as DASH recommended.  Regular physical activity was recommended as well.  Patient was advised to keep regular follow-ups.  The patient was seen today as part of an evaluation regarding hyperlipidemia.  Recent lab work has been reviewed with the patient as well as the goals for good cholesterol care.  In addition to this medications have been discussed the importance of compliance with diet and medications discussed as well.  Finally the patient is aware that poor control of cholesterol, noncompliance can dramatically increase the risk of complications. The patient will keep regular office visits and the patient does agreed to periodic lab work.  The patient was seen today as part of a comprehensive visit for diabetes. The importance of keeping her A1c at or below 7 was discussed.  Importance of regular physical activity was discussed.   The importance of adherence to medication as well as a controlled low starch/sugar diet was also discussed.  Standard follow-up visit recommended.  Also patient aware failure to keep diabetes under control increases the risk of complications.  The patient was seen in followup for chronic pain. A review over at their current pain status was discussed. Drug registry was checked. Prescriptions were given. Discussion was held regarding the importance of compliance with medication as well as pain medication contract.  Time for questions regarding pain management plan occurred. Importance of regular followup visits was discussed. Patient was informed that medication may cause drowsiness and should not be combined  with other medications/alcohol or street drugs. Patient was cautioned that medication could cause drowsiness. If the patient feels medication is causing altered alertness then do not drive or operate dangerous equipment.  9 drug registry was checked  3 prescriptions were written Patient will do her lab work in 2 months when it is more indicative of how she is really doing We had a long discussion held regarding dietary measures medications renewing her medicines Greater than half the time spent in counseling 25 minutes was spent with the patient.  This statement verifies that 25 minutes was indeed spent with the patient.  More than 50% of this visit-total duration of the visit-was spent in counseling and coordination of care. The issues that the patient came in for today as reflected in the diagnosis (s) please refer to documentation for further details.

## 2018-12-22 ENCOUNTER — Other Ambulatory Visit (HOSPITAL_COMMUNITY): Payer: Self-pay | Admitting: Family Medicine

## 2018-12-22 DIAGNOSIS — Z1231 Encounter for screening mammogram for malignant neoplasm of breast: Secondary | ICD-10-CM

## 2018-12-24 LAB — TOXASSURE SELECT 13 (MW), URINE

## 2018-12-24 LAB — SPECIMEN STATUS REPORT

## 2018-12-30 ENCOUNTER — Encounter: Payer: Self-pay | Admitting: Family Medicine

## 2018-12-30 ENCOUNTER — Ambulatory Visit (INDEPENDENT_AMBULATORY_CARE_PROVIDER_SITE_OTHER): Payer: PPO | Admitting: Family Medicine

## 2018-12-30 VITALS — BP 130/80 | Ht 62.0 in | Wt 208.0 lb

## 2018-12-30 DIAGNOSIS — F321 Major depressive disorder, single episode, moderate: Secondary | ICD-10-CM | POA: Diagnosis not present

## 2018-12-30 DIAGNOSIS — N644 Mastodynia: Secondary | ICD-10-CM | POA: Diagnosis not present

## 2018-12-30 DIAGNOSIS — Z124 Encounter for screening for malignant neoplasm of cervix: Secondary | ICD-10-CM | POA: Diagnosis not present

## 2018-12-30 DIAGNOSIS — N950 Postmenopausal bleeding: Secondary | ICD-10-CM

## 2018-12-30 DIAGNOSIS — Z0000 Encounter for general adult medical examination without abnormal findings: Secondary | ICD-10-CM

## 2018-12-30 DIAGNOSIS — N6459 Other signs and symptoms in breast: Secondary | ICD-10-CM | POA: Diagnosis not present

## 2018-12-30 NOTE — Progress Notes (Signed)
Subjective:    Patient ID: Jamie Harding, female    DOB: Apr 13, 1956, 63 y.o.   MRN: [id number]  HPI The patient comes in today for a wellness visit.  A review of their health history was completed. A review of medications was also completed.  Any needed refills; no  Eating habits: Good  Falls/  MVA accidents in past few months: No  Regular exercise: No; going to start silver sneakers plan at [place] pt sees on regular basis: No  Preventative health issues were discussed. Regular vision and dental exams. Mammogram scheduled for next week, last one was done in 2015. Colonoscopy UTD. Has eye exam scheduled in April. Pap smear overdue, will be done today - hx of abnormal pap smear was treated in 2003 or 2004, pt unsure what the diagnosis was.   Additional concerns: Pain and bleeding with sexual intercourse. States has been going on for several years. Sometimes has bleeding not related to intercourse. States usually just spotting but will last 1-2 days. Menopause LMP at age 78. Sexually active with husband, only partner. No vaginal discharge or itching/burning.   Also reports left breast pain that has been ongoing for several months per patient. She states she thought she felt a lump at one point but is not feeling it currently. Denies any discharge from nipples. Positive family history of breast cancer in her mom, states was diagnosed around age 18-61.   Feels like she is having more depression, doesn't feel like doing anything, compliant with effexor. States it's hard for her to feel different emotions. Denies SI/HI.   She states she has an upcoming appt for Dm check and pain management on April 27 with Dr.Scott    Review of Systems  Constitutional: Negative for fever and unexpected weight change.  HENT: Negative for congestion, ear pain and sore throat.   Eyes: Negative for discharge and visual disturbance.  Respiratory: Negative for shortness of breath.     Cardiovascular: Negative for chest pain and leg swelling.  Gastrointestinal: Negative for abdominal pain and blood in stool.  Genitourinary: Positive for vaginal bleeding and vaginal pain. Negative for difficulty urinating, hematuria, pelvic pain and vaginal discharge.  Skin: Negative for color change.  Neurological: Negative for weakness and headaches.  Psychiatric/Behavioral: Positive for dysphoric mood. Negative for suicidal ideas.       Objective:   Physical Exam Vitals signs and nursing note reviewed. Exam conducted with a chaperone present.  Constitutional:      General: She is not in acute distress.    Appearance: She is well-developed.  HENT:     Head: Normocephalic and atraumatic.     Right Ear: Tympanic membrane normal.     Left Ear: Tympanic membrane normal.     Nose: Nose normal.     Mouth/Throat:     Mouth: Mucous membranes are moist.     Pharynx: Oropharynx is clear. Uvula midline.  Eyes:     General:        Right eye: No discharge.        Left eye: No discharge.     Conjunctiva/sclera: Conjunctivae normal.     Pupils: Pupils are equal, round, and reactive to light.  Neck:     Musculoskeletal: Neck supple.     Thyroid: No thyromegaly.  Cardiovascular:     Rate and Rhythm: Normal rate and regular rhythm.     Heart sounds: Normal heart sounds. No murmur.  Pulmonary:     Effort: Pulmonary  effort is normal. No respiratory distress.     Breath sounds: Normal breath sounds. No wheezing.  Chest:     Breasts:        Right: No inverted nipple, mass, nipple discharge, skin change or tenderness.        Left: Inverted nipple and tenderness present. No mass, nipple discharge or skin change.     Comments: Left breast with inverted nipple, pt unaware of this change or when it first occurred. Tenderness to LLQ of left breast around the 4-5 o'clock position. No obvious masses palpated on exam.  Abdominal:     General: Bowel sounds are normal. There is no distension.      Palpations: Abdomen is soft. There is no mass.     Tenderness: There is no abdominal tenderness.  Genitourinary:    General: Normal vulva.     Vagina: Tenderness present. No vaginal discharge or bleeding.     Cervix: Discharge present. No cervical motion tenderness, friability, erythema or cervical bleeding.     Uterus: Normal.      Adnexa: Right adnexa normal and left adnexa normal.     Comments: Atrophic changes to vaginal tissue noted. Cervix with small amount of green/yellow discharge. No obvious masses or tenderness on bimanual exam, exam limited d/t abdominal girth.  Musculoskeletal:        General: No deformity.  Lymphadenopathy:     Cervical: No cervical adenopathy.     Upper Body:     Right upper body: No supraclavicular or axillary adenopathy.     Left upper body: No supraclavicular or axillary adenopathy.  Skin:    General: Skin is warm and dry.  Neurological:     Mental Status: She is alert and oriented to person, place, and time.     Coordination: Coordination normal.  Psychiatric:        Behavior: Behavior normal.    Depression screen [facility] 2/9 12/30/2018 09/22/2018 06/23/2018 03/04/2018 09/20/2015  Decreased Interest 2 3 3 2  0  Down, Depressed, Hopeless 2 2 3 1 1   PHQ - 2 Score 4 5 6 3 1   Altered sleeping 3 3 3 3  -  Tired, decreased energy 3 3 3 3  -  Change in appetite 2 2 1 3  -  Feeling bad or failure about yourself  2 0 0 0 -  Trouble concentrating 0 0 0 1 -  Moving slowly or fidgety/restless 0 0 0 0 -  Suicidal thoughts 0 0 0 0 -  PHQ-9 Score 14 13 13 13  -  Difficult doing work/chores Not difficult at all Very difficult Somewhat difficult - -  Some recent data might be hidden          Assessment & Plan:  1. Annual physical exam - Plan: Pap IG, CT/NG NAA, and HPV (high risk) Adult wellness-complete.wellness physical was conducted today. Importance of diet and exercise were discussed in detail.  In addition to this a discussion regarding safety was also  covered. We also reviewed over immunizations and gave recommendations regarding current immunization needed for age.  In addition to this additional areas were also touched on including: Preventative health exams needed:  Colonoscopy UTD Mammogram due; see plan for #3 and 4 Pap Smear done today   Patient was advised yearly wellness exam  2. Postmenopausal bleeding - Plan: US Pelvis Complete Pt with several year hx of postmenopausal bleeding, sometimes r/t intercourse sometimes not. Recommend we evaluate this further with a pelvic ultrasound. STD testing added on to  pap smear, however feel this is probably unlikely cause of her bleeding. Will notify patient of her results.   3. Breast pain, left - Plan: MM DIAG BREAST TOMO BILATERAL, US BREAST LTD UNI LEFT INC AXILLA, US BREAST LTD UNI RIGHT INC AXILLA Pt with localized tenderness to LLQ of left breast with inverted nipple to that breast of unknown duration. Recommend referral to General Surgery for their specialized opinion on this matter and will obtain diagnostic mammogram with ultrasound prior to her referral. Pt prefers Dr. Curlene Labrum for referral. Will notify of results.   4. Inverted nipple - Plan: Ambulatory referral to General Surgery See #3 plan  5. Screening for cervical cancer - Plan: Pap IG, CT/NG NAA, and HPV (high risk)  6. Depression, major, single episode, moderate ([place]) Pt with increased depression today, she is not suicidal. Recommend scheduling f/u to discuss this further in the next 2-4 weeks with Dr. Nicki Reaper. She understands that if she develops SI/HI she should seek care emergently.   Dr. [name] was consulted on this case and is in agreement with the above treatment plan.

## 2019-01-02 LAB — PAP IG, CT-NG NAA, HPV HIGH-RISK
Chlamydia, Nuc. Acid Amp: NEGATIVE
Gonococcus by Nucleic Acid Amp: NEGATIVE
HPV, high-risk: NEGATIVE

## 2019-01-08 ENCOUNTER — Ambulatory Visit (HOSPITAL_COMMUNITY): Payer: Self-pay

## 2019-01-09 ENCOUNTER — Ambulatory Visit (HOSPITAL_COMMUNITY): Payer: Self-pay

## 2019-01-13 ENCOUNTER — Other Ambulatory Visit: Payer: Self-pay | Admitting: Family Medicine

## 2019-01-13 ENCOUNTER — Ambulatory Visit (HOSPITAL_COMMUNITY)
Admission: RE | Admit: 2019-01-13 | Discharge: 2019-01-13 | Disposition: A | Discharge status: Home / Self Care | Payer: PPO | Source: Ambulatory Visit | Attending: Family Medicine | Admitting: Family Medicine

## 2019-01-13 ENCOUNTER — Ambulatory Visit (HOSPITAL_COMMUNITY): Payer: PPO

## 2019-01-13 DIAGNOSIS — N644 Mastodynia: Secondary | ICD-10-CM | POA: Diagnosis not present

## 2019-01-13 DIAGNOSIS — N650 Deformity of reconstructed breast: Secondary | ICD-10-CM | POA: Diagnosis not present

## 2019-01-13 DIAGNOSIS — N950 Postmenopausal bleeding: Secondary | ICD-10-CM

## 2019-01-13 DIAGNOSIS — N6489 Other specified disorders of breast: Secondary | ICD-10-CM | POA: Diagnosis not present

## 2019-01-13 DIAGNOSIS — R928 Other abnormal and inconclusive findings on diagnostic imaging of breast: Secondary | ICD-10-CM | POA: Diagnosis not present

## 2019-01-14 ENCOUNTER — Other Ambulatory Visit: Payer: Self-pay | Admitting: Family Medicine

## 2019-01-14 DIAGNOSIS — N9489 Other specified conditions associated with female genital organs and menstrual cycle: Secondary | ICD-10-CM

## 2019-01-16 ENCOUNTER — Encounter: Payer: Self-pay | Admitting: Family Medicine

## 2019-01-19 ENCOUNTER — Telehealth: Payer: Self-pay | Admitting: *Deleted

## 2019-01-19 ENCOUNTER — Encounter: Payer: Self-pay | Admitting: Family Medicine

## 2019-01-19 NOTE — Telephone Encounter (Signed)
Patient called concerning status of appt with GYN for ovarian mass. Patient aware you will contact her when scheduled

## 2019-01-20 ENCOUNTER — Ambulatory Visit: Payer: PPO | Admitting: Family Medicine

## 2019-01-21 ENCOUNTER — Encounter: Payer: Self-pay | Admitting: Family Medicine

## 2019-01-21 NOTE — Telephone Encounter (Signed)
Patient notified

## 2019-01-21 NOTE — Telephone Encounter (Signed)
Left message to return call 

## 2019-01-21 NOTE — Telephone Encounter (Signed)
1st referral to [facility] sent but they're not taking new pt's with Medicare or a Medicare replacement plan  [facility] to [facility] this morning

## 2019-01-23 ENCOUNTER — Other Ambulatory Visit: Payer: Self-pay | Admitting: Family Medicine

## 2019-01-26 NOTE — Telephone Encounter (Signed)
6 refills each

## 2019-01-29 ENCOUNTER — Encounter: Payer: Self-pay | Admitting: Family Medicine

## 2019-01-29 ENCOUNTER — Other Ambulatory Visit: Payer: Self-pay

## 2019-01-29 MED ORDER — CETIRIZINE HCL 10 MG PO TABS: 10.0000 mg | ORAL_TABLET | Freq: Every day | ORAL | 1 refills | Status: DC

## 2019-02-23 ENCOUNTER — Ambulatory Visit: Payer: PPO | Admitting: Obstetrics and Gynecology

## 2019-02-23 ENCOUNTER — Encounter: Payer: Self-pay | Admitting: Obstetrics and Gynecology

## 2019-02-23 ENCOUNTER — Other Ambulatory Visit (HOSPITAL_COMMUNITY)
Admission: RE | Admit: 2019-02-23 | Discharge: 2019-02-23 | Disposition: A | Discharge status: Home / Self Care | Payer: PPO | Source: Ambulatory Visit | Attending: Obstetrics and Gynecology | Admitting: Obstetrics and Gynecology

## 2019-02-23 ENCOUNTER — Other Ambulatory Visit: Payer: Self-pay

## 2019-02-23 VITALS — BP 149/83 | HR 94 | Temp 98.2°F | Ht 62.0 in | Wt 208.3 lb

## 2019-02-23 DIAGNOSIS — R079 Chest pain, unspecified: Secondary | ICD-10-CM | POA: Diagnosis not present

## 2019-02-23 DIAGNOSIS — H52223 Regular astigmatism, bilateral: Secondary | ICD-10-CM | POA: Diagnosis not present

## 2019-02-23 DIAGNOSIS — E119 Type 2 diabetes mellitus without complications: Secondary | ICD-10-CM | POA: Diagnosis not present

## 2019-02-23 DIAGNOSIS — N898 Other specified noninflammatory disorders of vagina: Secondary | ICD-10-CM

## 2019-02-23 DIAGNOSIS — N941 Unspecified dyspareunia: Secondary | ICD-10-CM

## 2019-02-23 DIAGNOSIS — E782 Mixed hyperlipidemia: Secondary | ICD-10-CM | POA: Diagnosis not present

## 2019-02-23 DIAGNOSIS — R2689 Other abnormalities of gait and mobility: Secondary | ICD-10-CM | POA: Diagnosis not present

## 2019-02-23 DIAGNOSIS — G20 Parkinson's disease: Secondary | ICD-10-CM | POA: Diagnosis not present

## 2019-02-23 DIAGNOSIS — N950 Postmenopausal bleeding: Secondary | ICD-10-CM

## 2019-02-23 DIAGNOSIS — Z3202 Encounter for pregnancy test, result negative: Secondary | ICD-10-CM

## 2019-02-23 DIAGNOSIS — I10 Essential (primary) hypertension: Secondary | ICD-10-CM | POA: Diagnosis not present

## 2019-02-23 DIAGNOSIS — E113293 Type 2 diabetes mellitus with mild nonproliferative diabetic retinopathy without macular edema, bilateral: Secondary | ICD-10-CM | POA: Diagnosis not present

## 2019-02-23 DIAGNOSIS — D510 Vitamin B12 deficiency anemia due to intrinsic factor deficiency: Secondary | ICD-10-CM | POA: Diagnosis not present

## 2019-02-23 DIAGNOSIS — I110 Hypertensive heart disease with heart failure: Secondary | ICD-10-CM | POA: Diagnosis not present

## 2019-02-23 DIAGNOSIS — Z9842 Cataract extraction status, left eye: Secondary | ICD-10-CM | POA: Diagnosis not present

## 2019-02-23 DIAGNOSIS — R1900 Intra-abdominal and pelvic swelling, mass and lump, unspecified site: Secondary | ICD-10-CM

## 2019-02-23 DIAGNOSIS — Z9841 Cataract extraction status, right eye: Secondary | ICD-10-CM | POA: Diagnosis not present

## 2019-02-23 DIAGNOSIS — R278 Other lack of coordination: Secondary | ICD-10-CM | POA: Diagnosis not present

## 2019-02-23 DIAGNOSIS — E559 Vitamin D deficiency, unspecified: Secondary | ICD-10-CM | POA: Diagnosis not present

## 2019-02-23 LAB — POCT URINE PREGNANCY: Preg Test, Ur: NEGATIVE

## 2019-02-23 NOTE — Progress Notes (Signed)
ENDOMETRIAL BIOPSY      Jamie Harding is a 63 y.o. [id number] here for endometrial biopsy.  The indications for endometrial biopsy were reviewed.  Risks of the biopsy including cramping, bleeding, infection, uterine perforation, inadequate specimen and need for additional procedures were discussed. The patient states she understands and agrees to undergo procedure today. Consent was signed. Time out was performed.   Indications: post menopausal bleeding Urine HCG: negative  A bivalve speculum was placed into the vagina and the cervix was easily visualized and was prepped with Betadine x2. A single-toothed tenaculum was placed on the anterior lip of the cervix to stabilize it. The 3 mm pipelle was attempted to be introduced into cavity, however cervix was significantly stenotic. Cervix was able to be penetrated with the small cervical dilator. The 3 mm pipelle was then easily introduced into the endometrial cavity without difficulty to a depth of 6 cm, and a scant amount of tissue was obtained and sent to pathology. This was repeated for a total of 4 passes. The instruments were removed from the patient's vagina. Minimal bleeding from the cervix at the tenaculum was noted.   The patient tolerated the procedure well. Routine post-procedure instructions were given to the patient.     Feliz Beam, M.D. Attending Center for [employer] farm manager)
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
## 2019-02-23 NOTE — Progress Notes (Signed)
NEW GYN.  Presents for Endometrial Mass.  Last PAP 12/30/2018.  Last Mammo 01/13/2019  UPT done today is NEGATIVE.

## 2019-02-24 LAB — CERVICOVAGINAL ANCILLARY ONLY
Bacterial vaginitis: NEGATIVE
Candida vaginitis: NEGATIVE
Trichomonas: NEGATIVE

## 2019-03-10 ENCOUNTER — Telehealth: Payer: Self-pay | Admitting: *Deleted

## 2019-03-10 NOTE — Telephone Encounter (Signed)
Pt called to office for results and to make Dr Rosana Hoes aware that she would like to proceed with D&C.  Placed call to pt, LM on VM. Making pt aware that non urgent procedures have been put on hold (per office mgr) due to Covid-19. Made aware that message would be sent to Dr Rosana Hoes for any other recommendations.  Pt advised to call office if she would like to discuss further.    Please advise on other tx recommendations.

## 2019-03-16 ENCOUNTER — Ambulatory Visit (INDEPENDENT_AMBULATORY_CARE_PROVIDER_SITE_OTHER): Payer: PPO | Admitting: Family Medicine

## 2019-03-16 ENCOUNTER — Other Ambulatory Visit: Payer: Self-pay

## 2019-03-16 ENCOUNTER — Ambulatory Visit: Payer: PPO | Admitting: Family Medicine

## 2019-03-16 DIAGNOSIS — G894 Chronic pain syndrome: Secondary | ICD-10-CM

## 2019-03-16 DIAGNOSIS — E1159 Type 2 diabetes mellitus with other circulatory complications: Secondary | ICD-10-CM

## 2019-03-16 DIAGNOSIS — I10 Essential (primary) hypertension: Secondary | ICD-10-CM

## 2019-03-16 DIAGNOSIS — E7849 Other hyperlipidemia: Secondary | ICD-10-CM | POA: Diagnosis not present

## 2019-03-16 MED ORDER — HYDROCODONE-ACETAMINOPHEN 7.5-325 MG PO TABS: 1.0000 | ORAL_TABLET | Freq: Four times a day (QID) | ORAL | 0 refills | Status: DC | PRN

## 2019-03-16 MED ORDER — NOVOLOG FLEXPEN 100 UNIT/ML ~~LOC~~ SOPN: 22.0000 [IU] | PEN_INJECTOR | Freq: Three times a day (TID) | SUBCUTANEOUS | 1 refills | Status: DC

## 2019-03-16 MED ORDER — LISINOPRIL 20 MG PO TABS: 30.0000 mg | ORAL_TABLET | Freq: Every day | ORAL | 5 refills | Status: DC

## 2019-03-16 MED ORDER — INSULIN DETEMIR 100 UNIT/ML ~~LOC~~ SOLN: SUBCUTANEOUS | 1 refills | Status: DC

## 2019-03-16 MED ORDER — SITAGLIPTIN PHOSPHATE 100 MG PO TABS: 100.0000 mg | ORAL_TABLET | Freq: Every day | ORAL | 1 refills | Status: DC

## 2019-03-16 MED ORDER — AMLODIPINE BESYLATE 10 MG PO TABS: 10.0000 mg | ORAL_TABLET | Freq: Every day | ORAL | 5 refills | Status: DC

## 2019-03-16 MED ORDER — ROSUVASTATIN CALCIUM 10 MG PO TABS: 10.0000 mg | ORAL_TABLET | Freq: Every day | ORAL | 5 refills | Status: DC

## 2019-03-16 MED ORDER — METFORMIN HCL 1000 MG PO TABS: ORAL_TABLET | ORAL | 5 refills | Status: DC

## 2019-03-16 NOTE — Progress Notes (Signed)
Subjective:    Patient ID: Jamie Harding, female    DOB: 11/04/1956, 63 y.o.   MRN: [id number]  HPI  Format-video  Patient present at home Provider present at office Consent for interaction obtained Coronavirus outbreak made virtual visit necessary   This patient was seen today for chronic pain  The medication list was reviewed and updated.   -Compliance with medication: yes  - Number patient states they take daily: 4 a day  -when was the last dose patient took? today  The patient was advised the importance of maintaining medication and not using illegal substances with these.  Here for refills and follow up  The patient was educated that we can provide 3 monthly scripts for their medication, it is their responsibility to follow the instructions.  Side effects or complications from medications: no  Patient is aware that pain medications are meant to minimize the severity of the pain to allow their pain levels to improve to allow for better function. They are aware of that pain medications cannot totally remove their pain. The patient states although the pain medicine does help but does not help enough she finds her self having increased pain throughout the day and evening which interferes with her ability to function she is requesting that the strength of the medicine be increased I told her that we would increase the pain medicine strength but if it causes drowsiness dopiness or other problems she needs to let us know and go back to the previous strength Virtual Visit via Video Note  I connected with [place] on 61/26/33 at 10:30 AM EDT by a video enabled telemedicine application and verified that I am speaking with the correct person using two identifiers.   I discussed the limitations of evaluation and management by telemedicine and the availability of in person appointments. The patient expressed understanding and agreed to proceed.  History of Present Illness:    Observations/Objective:   Assessment and Plan:   Follow Up Instructions:    I discussed the assessment and treatment plan with the patient. The patient was provided an opportunity to ask questions and all were answered. The patient agreed with the plan and demonstrated an understanding of the instructions.   The patient was advised to call back or seek an in-person evaluation if the symptoms worsen or if the condition fails to improve as anticipated.  I provided 25 minutes of non-face-to-face time during this encounter.     Patient for blood pressure check up.  The patient does have hypertension.  The patient is on medication.  Patient relates compliance with meds. Todays BP reviewed with the patient. Patient denies issues with medication. Patient relates reasonable diet. Patient tries to minimize salt. Patient aware of BP goals.  Patient does suffer with significant obesity she will work hard at trying to lose weight  The patient's BMI is calculated.  The patient does have obesity.  The patient does try to some degree staying active and watching diet.  It is in the vital signs and acknowledged.  It is above the recommended BMI for the patient's height and weight.  The patient has been counseled regarding healthy diet, restricted portions, avoiding excessive carbohydrates/sugary foods, and increase physical activity as health permits.  It is in the patient's best interest to lower the risk of secondary illness including heart disease strokes and cancer by losing weight.  The patient acknowledges this information.  Patient here for follow-up regarding cholesterol.  The patient does  have hyperlipidemia.  Patient does try to maintain a reasonable diet.  Patient does take the medication on a regular basis.  Denies missing a dose.  The patient denies any obvious side effects.  Prior blood work results reviewed with the patient.  The patient is aware of his cholesterol goals and the need to  keep it under good control to lessen the risk of disease.  The patient was seen today as part of a comprehensive diabetic check up.the patient does have diabetes.  The patient follows here on a regular basis.  The patient relates medication compliance. No significant side effects to the medications. Denies any low glucose spells. Relates compliance with diet to a reasonable level. Patient does do labwork intermittently and understands the dangers of diabetes.        Review of Systems     Objective:   Physical Exam        Assessment & Plan:  HTN- Patient was seen today as part of a visit regarding hypertension. The importance of healthy diet and regular physical activity was discussed. The importance of compliance with medications discussed.  Ideal goal is to keep blood pressure low elevated levels certainly below 932/35 when possible.  The patient was counseled that keeping blood pressure under control lessen his risk of complications.  The importance of regular follow-ups was discussed with the patient.  Low-salt diet such as DASH recommended.  Regular physical activity was recommended as well.  Patient was advised to keep regular follow-ups.  The patient was seen in followup for chronic pain. A review over at their current pain status was discussed. Drug registry was checked. Prescriptions were given. Discussion was held regarding the importance of compliance with medication as well as pain medication contract.  Time for questions regarding pain management plan occurred. Importance of regular followup visits was discussed. Patient was informed that medication may cause drowsiness and should not be combined  with other medications/alcohol or street drugs. Patient was cautioned that medication could cause drowsiness. If the patient feels medication is causing altered alertness then do not drive or operate dangerous equipment.  Her pain medicine was increased on strength please  see discussion above she is to destroy her remaining 5 mg tablets  The patient was seen today as part of a comprehensive visit for diabetes. The importance of keeping her A1c at or below 7 was discussed.  Importance of regular physical activity was discussed.   The importance of adherence to medication as well as a controlled low starch/sugar diet was also discussed.  Standard follow-up visit recommended.  Also patient aware failure to keep diabetes under control increases the risk of complications.  The patient was seen today as part of an evaluation regarding hyperlipidemia.  Recent lab work has been reviewed with the patient as well as the goals for good cholesterol care.  In addition to this medications have been discussed the importance of compliance with diet and medications discussed as well.  Finally the patient is aware that poor control of cholesterol, noncompliance can dramatically increase the risk of complications. The patient will keep regular office visits and the patient does agreed to periodic lab work.  Morbid obesity she will work hard at losing weight

## 2019-03-19 ENCOUNTER — Telehealth: Payer: Self-pay | Admitting: Obstetrics and Gynecology

## 2019-03-19 NOTE — Telephone Encounter (Signed)
Called patient to discuss bleeding and management options, no answer, left VM, will attempt again next week.   Feliz Beam, M.D. Attending [facility])

## 2019-03-30 ENCOUNTER — Telehealth: Payer: Self-pay | Admitting: Obstetrics and Gynecology

## 2019-03-30 NOTE — Telephone Encounter (Signed)
Called patient to discuss next steps for post menopausal bleeding, no answer, left VM.    Feliz Beam, M.D. [facility]ending [facility])

## 2019-04-10 ENCOUNTER — Encounter: Payer: Self-pay | Admitting: Family Medicine

## 2019-04-10 MED ORDER — NITROFURANTOIN MONOHYD MACRO 100 MG PO CAPS: ORAL_CAPSULE | ORAL | 0 refills | Status: DC

## 2019-04-17 ENCOUNTER — Ambulatory Visit (INDEPENDENT_AMBULATORY_CARE_PROVIDER_SITE_OTHER): Payer: PPO | Admitting: Obstetrics and Gynecology

## 2019-04-17 ENCOUNTER — Encounter: Payer: Self-pay | Admitting: Obstetrics and Gynecology

## 2019-04-17 VITALS — Wt 215.0 lb

## 2019-04-17 DIAGNOSIS — R102 Pelvic and perineal pain: Secondary | ICD-10-CM | POA: Diagnosis not present

## 2019-04-17 DIAGNOSIS — N950 Postmenopausal bleeding: Secondary | ICD-10-CM | POA: Diagnosis not present

## 2019-04-17 NOTE — Progress Notes (Signed)
Pt is on the phone preparing for virtual visit with provider.  

## 2019-04-17 NOTE — Progress Notes (Signed)
TELEHEALTH GYNECOLOGY VIRTUAL VIDEO VISIT ENCOUNTER NOTE  Provider location: [facility] [place]   I connected with Jamie Harding on 45/62/56 at  8:30 AM EDT by WebEx Video Encounter at home and verified that I am speaking with the correct person using two identifiers.   I discussed the limitations, risks, security and privacy concerns of performing an evaluation and management service by telephone and the availability of in person appointments. I also discussed with the patient that there may be a patient responsible charge related to this service. The patient expressed understanding and agreed to proceed.   History:  Jamie Harding is a 63 y.o. [phone] female being evaluated today for post menopausal bleeding. Has had some bleeding since last convo but not as much, still mainly after intercourse. Had had some bleeding unrelated to intercourse but not recently. Still having pain with intercourse despite using lube with intercourse.      Past Medical History:  Diagnosis Date  . Anxiety   . Asthma   . Depression   . Essential hypertension   . Fatty liver   . Gastritis   . Gastroparesis   . GERD (gastroesophageal reflux disease)   . Hiatal hernia   . History of cardiac catheterization    Minimal coronary atherosclerosis February 2016  . History of kidney stones   . History of stroke 2008  . HLD (hyperlipidemia)   . Neuropathy   . Obesity   . Pancreatitis   . Rheumatoid arthritis(714.0)   . Tibialis tendinitis   . Type 2 diabetes mellitus ([place])    Past Surgical History:  Procedure Laterality Date  . ANKLE SURGERY     remove extra bone-left  . CARPAL TUNNEL RELEASE     right side  . CARPAL TUNNEL RELEASE Left 02/06/2013   Procedure: CARPAL TUNNEL RELEASE ;  Surgeon: Carole Civil, MD;  Location: [facility];  Service: Orthopedics;  Laterality: Left;  procedure end 1135  . CARPAL TUNNEL RELEASE Right 09/14/2015   Procedure: RIGHT CARPAL TUNNEL  RELEASE;  Surgeon: Carole Civil, MD;  Location: [facility];  Service: Orthopedics;  Laterality: Right;  . CARPAL TUNNEL RELEASE Left 09/28/2015   Procedure: LEFT CARPAL TUNNEL RELEASE;  Surgeon: Carole Civil, MD;  Location: [facility];  Service: Orthopedics;  Laterality: Left;  procedure 1  . CESAREAN SECTION    . CHOLECYSTECTOMY    . COLONOSCOPY WITH PROPOFOL  09/16/2012   KAJ:GOTL sessile polyps ranging between 3-39mm in size were found in the sigmoid colon and rectum; polypectomy was performed/Mild diverticulosis was noted throughout the entire examined colon/Small internal hemorrhoids  . DILATION AND CURETTAGE OF UTERUS     multiple  . ESOPHAGOGASTRODUODENOSCOPY  07/29/2009   Mild gastritis, benign path  . ESOPHAGOGASTRODUODENOSCOPY (EGD) WITH PROPOFOL  09/16/2012   SLF:Non-erosive gastritis (inflammation) was found; multiple bx/The duodenal mucosa showed no abnormalities in the ampulla and bulb and second portion of the duodenum/NAUSEA/VOMITING MOST LIKELY DUE TO GERD/GASTRITIS  . FINGER SURGERY Left    left little finger-otif of finger  . Gastric Emptying  07/27/2009   The amount of activity in the stomach at 120 minutes was 13% which is in the normal range  . LASER ABLATION OF THE CERVIX    . LEFT AND RIGHT HEART CATHETERIZATION WITH CORONARY ANGIOGRAM N/A 01/03/2015   Procedure: LEFT AND RIGHT HEART CATHETERIZATION WITH CORONARY ANGIOGRAM;  Surgeon: Blane Ohara, MD;  Location: [facility];  Service: Cardiovascular;  Laterality: N/A;  . LITHOTRIPSY    . POLYPECTOMY  09/16/2012   Procedure: POLYPECTOMY;  Surgeon: Danie Binder, MD;  Location: [facility];  Service: Endoscopy;  Laterality: N/A;  . SAVORY DILATION  09/16/2012   Procedure: SAVORY DILATION;  Surgeon: Danie Binder, MD;  Location: [facility];  Service: Endoscopy;  Laterality: N/A;  16 fr dilation  . TRIGGER FINGER RELEASE Left 02/06/2013   Procedure: RELEASE TRIGGER FINGER LEFT LONG FINGER/A-1 PULLEY;  Surgeon: Carole Civil, MD;  Location: [facility];  Service: Orthopedics;  Laterality: Left;  procedure began 1136  . TRIGGER FINGER RELEASE Right 09/14/2015   Procedure: RIGHT LONG FINGER TRIGGER RELEASE;  Surgeon: Carole Civil, MD;  Location: [facility];  Service: Orthopedics;  Laterality: Right;  . TRIGGER FINGER RELEASE Left 09/28/2015   Procedure: LEFT RING TRIGGER FINGER RELEASE;  Surgeon: Carole Civil, MD;  Location: [facility];  Service: Orthopedics;  Laterality: Left;  left ring finger  . TUBAL LIGATION     The following portions of the patient's history were reviewed and updated as appropriate: allergies, current medications, past family history, past medical history, past social history, past surgical history and problem list.   Health Maintenance:  Normal pap and negative HRHPV on 12/30/18.  Normal mammogram on 01/13/19.   Review of Systems:  Pertinent items noted in HPI and remainder of comprehensive ROS otherwise negative.  Physical Exam:   General:  Alert, oriented and cooperative. Patient appears to be in no acute distress.  Mental Status: Normal mood and affect. Normal behavior. Normal judgment and thought content.   Respiratory: Normal respiratory effort, no problems with respiration noted  Rest of physical exam deferred due to type of encounter  Labs and Imaging IMPRESSION: 1. Somewhat ill-defined masslike area measuring 1.5 x 1.9 x 1.8 cm at the level of the uterine fundus. Unclear whether this reflects a myometrial lesion such is a uterine fibroid or possibly endometrial mass. In the setting of post-menopausal bleeding, endometrial sampling is indicated to exclude carcinoma. If results are benign, sonohysterogram should be considered for focal lesion work-up prior to hysteroscopy. (Ref: Radiological Reasoning: Algorithmic Workup of Abnormal Vaginal Bleeding with Endovaginal Sonography and Sonohysterography. AJR 2008; 536:U44-03). 2. Nonvisualization of the ovaries. No other  adnexal mass or free fluid within the pelvis.   Electronically Signed   By: Jeannine Boga M.D.   On: 01/13/2019 12:51  Assessment and Plan:    1. Post-menopausal bleeding - Reviewed benign EMB and pap. Reviewed her bleeding is likely atrophic vaginitis. Reviewed I would be hesitant to prescribe estrogen cream given her significant medical history, particularly for CVA events and she is agreeable to this. Reviewed that the small area described as a mass in her [name] is very unlikely to be malignancy or causing her bleeding, however it could be further characterized by MRI and if it is malignancy, she would need further management. Reviewed I would hesitate to take her to surgery for D&C for further sampling given her significant co-morbidities. Offered the option of expectant management vs MRI, will run case by [place] for their input as well. Patient verbalizes understanding that mass is unlikely to be malignancy but cannot be completely ruled out, will proceed with what Gyn Onc recommends.  2. Vaginal pain -see above, likely atrophic vaginitis  I discussed the assessment and treatment plan with the patient. The patient was provided an opportunity to ask questions and all were answered. The patient agreed with the plan and  demonstrated an understanding of the instructions.   The patient was advised to call back or seek an in-person evaluation/go to the ED if the symptoms worsen or if the condition fails to improve as anticipated.  I provided 16 minutes of face-to-face time during this encounter.   Sloan Leiter, MD [facility] [place], [place]

## 2019-04-23 ENCOUNTER — Encounter: Payer: Self-pay | Admitting: Obstetrics and Gynecology

## 2019-05-04 ENCOUNTER — Other Ambulatory Visit: Payer: Self-pay | Admitting: Family Medicine

## 2019-05-04 ENCOUNTER — Telehealth: Payer: Self-pay | Admitting: *Deleted

## 2019-05-04 ENCOUNTER — Other Ambulatory Visit: Payer: Self-pay | Admitting: *Deleted

## 2019-05-04 MED ORDER — VENLAFAXINE HCL 75 MG PO TABS: ORAL_TABLET | ORAL | 1 refills | Status: DC

## 2019-05-04 MED ORDER — ROSUVASTATIN CALCIUM 10 MG PO TABS: 10.0000 mg | ORAL_TABLET | Freq: Every day | ORAL | 0 refills | Status: DC

## 2019-05-04 MED ORDER — AMLODIPINE BESYLATE 10 MG PO TABS: 10.0000 mg | ORAL_TABLET | Freq: Every day | ORAL | 0 refills | Status: DC

## 2019-05-04 MED ORDER — PANTOPRAZOLE SODIUM 40 MG PO TBEC: DELAYED_RELEASE_TABLET | ORAL | 0 refills | Status: DC

## 2019-05-04 MED ORDER — METFORMIN HCL 1000 MG PO TABS: ORAL_TABLET | ORAL | 0 refills | Status: DC

## 2019-05-04 MED ORDER — LISINOPRIL 20 MG PO TABS: ORAL_TABLET | ORAL | 0 refills | Status: DC

## 2019-05-04 NOTE — Telephone Encounter (Signed)
She may have 90-day on the Effexor with 1 refill

## 2019-05-04 NOTE — Telephone Encounter (Signed)
Prescription sent electronically to pharmacy for 90 day supply of medication

## 2019-05-04 NOTE — Telephone Encounter (Signed)
 pharm calling wanting all of her meds changed to 90 day. Last med check up 4/27. I did all meds with the protocol except the effexor. Can she have 90 day on this med.

## 2019-05-13 ENCOUNTER — Encounter: Payer: Self-pay | Admitting: Family Medicine

## 2019-05-18 ENCOUNTER — Other Ambulatory Visit: Payer: Self-pay

## 2019-05-18 ENCOUNTER — Encounter: Payer: Self-pay | Admitting: Family Medicine

## 2019-05-18 ENCOUNTER — Ambulatory Visit (INDEPENDENT_AMBULATORY_CARE_PROVIDER_SITE_OTHER): Payer: PPO | Admitting: Family Medicine

## 2019-05-18 DIAGNOSIS — A499 Bacterial infection, unspecified: Secondary | ICD-10-CM | POA: Diagnosis not present

## 2019-05-18 DIAGNOSIS — B373 Candidiasis of vulva and vagina: Secondary | ICD-10-CM | POA: Diagnosis not present

## 2019-05-18 DIAGNOSIS — N390 Urinary tract infection, site not specified: Secondary | ICD-10-CM

## 2019-05-18 DIAGNOSIS — B3731 Acute candidiasis of vulva and vagina: Secondary | ICD-10-CM

## 2019-05-18 MED ORDER — FLUCONAZOLE 150 MG PO TABS: ORAL_TABLET | ORAL | 1 refills | Status: DC

## 2019-05-18 MED ORDER — CEPHALEXIN 500 MG PO CAPS: 500.0000 mg | ORAL_CAPSULE | Freq: Four times a day (QID) | ORAL | 0 refills | Status: DC

## 2019-05-18 NOTE — Progress Notes (Signed)
   Subjective:    Patient ID: Jamie Harding, female    DOB: 1956-02-10, 63 y.o.   MRN: [id number]  HPI Patient calls with dysuria for week or so. Patient has a history of UTIs. Patient states it started last week and got a little better but now having frequency and feels she needs an antibiotic.  Virtual Visit via Video Note  I connected with Tanaia A Bohlman on 61/79/48 at  2:00 PM EDT by a video enabled telemedicine application and verified that I am speaking with the correct person using two identifiers.  Location: Patient: home Provider: office   I discussed the limitations of evaluation and management by telemedicine and the availability of in person appointments. The patient expressed understanding and agreed to proceed.  History of Present Illness:    Observations/Objective:   Assessment and Plan:   Follow Up Instructions:    I discussed the assessment and treatment plan with the patient. The patient was provided an opportunity to ask questions and all were answered. The patient agreed with the plan and demonstrated an understanding of the instructions.   The patient was advised to call back or seek an in-person evaluation if the symptoms worsen or if the condition fails to improve as anticipated.  I provided 15 minutes of non-face-to-face time during this encounter.  Patient with dysuria urinary frequency and lower abdominal burning denies any nausea vomiting high fever chills wheezing difficulty breathing    Review of Systems  Constitutional: Negative for activity change and fever.  HENT: Negative for congestion, ear pain and rhinorrhea.   Eyes: Negative for discharge.  Respiratory: Negative for cough, shortness of breath and wheezing.   Cardiovascular: Negative for chest pain.  Genitourinary: Positive for dysuria and frequency.       Objective:   Physical Exam  Patient had virtual visit Appears to be in no distress Atraumatic Neuro able to relate and  oriented No apparent resp distress Color normal       Assessment & Plan:  UTI along with possibility of a mild yeast infection Diflucan as well as antibiotics over the next 7 days follow-up if progressive troubles or worse  Comprehensive lab work along with office visit later in July in person

## 2019-06-05 ENCOUNTER — Other Ambulatory Visit: Payer: Self-pay

## 2019-06-08 ENCOUNTER — Encounter: Payer: Self-pay | Admitting: Family Medicine

## 2019-06-08 ENCOUNTER — Other Ambulatory Visit: Payer: Self-pay

## 2019-06-08 ENCOUNTER — Ambulatory Visit (INDEPENDENT_AMBULATORY_CARE_PROVIDER_SITE_OTHER): Payer: PPO | Admitting: Family Medicine

## 2019-06-08 VITALS — BP 112/68 | Ht 62.0 in | Wt 210.0 lb

## 2019-06-08 DIAGNOSIS — Z113 Encounter for screening for infections with a predominantly sexual mode of transmission: Secondary | ICD-10-CM

## 2019-06-08 DIAGNOSIS — R251 Tremor, unspecified: Secondary | ICD-10-CM | POA: Diagnosis not present

## 2019-06-08 DIAGNOSIS — Z79899 Other long term (current) drug therapy: Secondary | ICD-10-CM | POA: Diagnosis not present

## 2019-06-08 DIAGNOSIS — E1159 Type 2 diabetes mellitus with other circulatory complications: Secondary | ICD-10-CM | POA: Diagnosis not present

## 2019-06-08 DIAGNOSIS — I10 Essential (primary) hypertension: Secondary | ICD-10-CM | POA: Diagnosis not present

## 2019-06-08 DIAGNOSIS — E7849 Other hyperlipidemia: Secondary | ICD-10-CM

## 2019-06-08 LAB — POCT GLYCOSYLATED HEMOGLOBIN (HGB A1C): Hemoglobin A1C: 8.7 % — AB (ref 4.0–5.6)

## 2019-06-08 MED ORDER — TETANUS-DIPHTH-ACELL PERTUSSIS 5-2.5-18.5 LF-MCG/0.5 IM SUSP: 0.5000 mL | Freq: Once | INTRAMUSCULAR | 0 refills | Status: AC

## 2019-06-08 MED ORDER — HYDROCODONE-ACETAMINOPHEN 7.5-325 MG PO TABS: 1.0000 | ORAL_TABLET | Freq: Four times a day (QID) | ORAL | 0 refills | Status: DC | PRN

## 2019-06-08 NOTE — Progress Notes (Signed)
Subjective:    Patient ID: Jamie Harding, female    DOB: Aug 22, 1956, 63 y.o.   MRN: [id number]  HPI  This patient was seen today for chronic pain  The medication list was reviewed and updated.   -Compliance with medication: yes  - Number patient states they take daily: 4   -when was the last dose patient took? today  The patient was advised the importance of maintaining medication and not using illegal substances with these.  Here for refills and follow up  The patient was educated that we can provide 3 monthly scripts for their medication, it is their responsibility to follow the instructions.  Side effects or complications from medications: none  Patient is aware that pain medications are meant to minimize the severity of the pain to allow their pain levels to improve to allow for better function. They are aware of that pain medications cannot totally remove their pain.  Due for UDT ( at least once per year) : last done 11/2018  The patient was seen today as part of a comprehensive diabetic check up.the patient does have diabetes.  The patient follows here on a regular basis.  The patient relates medication compliance. No significant side effects to the medications. Denies any low glucose spells. Relates compliance with diet to a reasonable level. Patient does do labwork intermittently and understands the dangers of diabetes.   Patient here for follow-up regarding cholesterol.  The patient does have hyperlipidemia.  Patient does try to maintain a reasonable diet.  Patient does take the medication on a regular basis.  Denies missing a dose.  The patient denies any obvious side effects.  Prior blood work results reviewed with the patient.  The patient is aware of his cholesterol goals and the need to keep it under good control to lessen the risk of disease.   Patient for blood pressure check up.  The patient does have hypertension.  The patient is on medication.  Patient relates  compliance with meds. Todays BP reviewed with the patient. Patient denies issues with medication. Patient relates reasonable diet. Patient tries to minimize salt. Patient aware of BP goals.     Office Visit from 06/08/2019 in [place]  PHQ-9 Total Score  13         Review of Systems  Constitutional: Negative for activity change, appetite change and fatigue.  HENT: Negative for congestion and rhinorrhea.   Respiratory: Negative for cough and shortness of breath.   Cardiovascular: Negative for chest pain and leg swelling.  Gastrointestinal: Negative for abdominal pain and diarrhea.  Endocrine: Negative for polydipsia and polyphagia.  Skin: Negative for color change.  Neurological: Negative for dizziness and weakness.  Psychiatric/Behavioral: Negative for behavioral problems and confusion.       Objective:   Physical Exam Vitals signs reviewed.  Constitutional:      General: She is not in acute distress. HENT:     Head: Normocephalic and atraumatic.  Eyes:     General:        Right eye: No discharge.        Left eye: No discharge.  Neck:     Trachea: No tracheal deviation.  Cardiovascular:     Rate and Rhythm: Normal rate and regular rhythm.     Heart sounds: Normal heart sounds. No murmur.  Pulmonary:     Effort: Pulmonary effort is normal. No respiratory distress.     Breath sounds: Normal breath sounds.  Lymphadenopathy:  Cervical: No cervical adenopathy.  Skin:    General: Skin is warm and dry.  Neurological:     Mental Status: She is alert.     Coordination: Coordination normal.  Psychiatric:        Behavior: Behavior normal.     Patient states she will get eye exam later this year  Patient will do lab work  25 minutes was spent with the patient.  This statement verifies that 25 minutes was indeed spent with the patient.  More than 50% of this visit-total duration of the visit-was spent in counseling and coordination of care. The issues  that the patient came in for today as reflected in the diagnosis (s) please refer to documentation for further details.     Assessment & Plan:  The patient was seen in followup for chronic pain. A review over at their current pain status was discussed. Drug registry was checked. Prescriptions were given. Discussion was held regarding the importance of compliance with medication as well as pain medication contract.  Time for questions regarding pain management plan occurred. Importance of regular followup visits was discussed. Patient was informed that medication may cause drowsiness and should not be combined  with other medications/alcohol or street drugs. Patient was cautioned that medication could cause drowsiness. If the patient feels medication is causing altered alertness then do not drive or operate dangerous equipment.  The patient was seen today as part of a comprehensive visit for diabetes. The importance of keeping her A1c at or below 7 was discussed.  Importance of regular physical activity was discussed.   The importance of adherence to medication as well as a controlled low starch/sugar diet was also discussed.  Standard follow-up visit recommended.  Also patient aware failure to keep diabetes under control increases the risk of complications.  Diabetes subpar control she will check her sugars more frequently write these down and bring them back to [name] we will need to do some adjustment of her medicines for now continue long-acting insulin 60 units in the morning, 7 units in the evening, short acting insulin 15 to 23 units with meals, continue Januvia, continue metformin  Blood pressure under good control continue current measures watch diet closely continue medications  The patient was seen today as part of an evaluation regarding hyperlipidemia.  Recent lab work has been reviewed with the patient as well as the goals for good cholesterol care.  In addition to this medications  have been discussed the importance of compliance with diet and medications discussed as well.  Finally the patient is aware that poor control of cholesterol, noncompliance can dramatically increase the risk of complications. The patient will keep regular office visits and the patient does agreed to periodic lab work.  Patient with tremor occasionally intermittently on today's exam I do not find evidence of this but I told her she needs to watch this closely if it becomes more persistent we will need to do neurologic work-up it could be muscle fatigue  Patient also has depression under fair control with medications unfortunately the pandemic is not helping  Patient also has an issue of a growth within the uterus she is concerned about this she saw gynecology in [place] she wonders if she needs to have follow-up or additional opinion  A MyChart message regarding this situation was sent to the patient patient will notify us if nothing from gynecology within the next 2 weeks

## 2019-06-19 ENCOUNTER — Encounter: Payer: Self-pay | Admitting: Family Medicine

## 2019-06-19 ENCOUNTER — Other Ambulatory Visit: Payer: Self-pay | Admitting: Family Medicine

## 2019-06-19 ENCOUNTER — Ambulatory Visit: Payer: PPO | Admitting: Family Medicine

## 2019-06-19 MED ORDER — CEFPROZIL 500 MG PO TABS: 500.0000 mg | ORAL_TABLET | Freq: Two times a day (BID) | ORAL | 0 refills | Status: DC

## 2019-06-19 NOTE — Telephone Encounter (Signed)
Symptoms started last night but worse today. Sinus headache, runny nose, no fever, no sob. Not tried any treatments.  [place] [facility].

## 2019-06-22 ENCOUNTER — Other Ambulatory Visit: Payer: Self-pay

## 2019-06-29 ENCOUNTER — Telehealth (INDEPENDENT_AMBULATORY_CARE_PROVIDER_SITE_OTHER): Payer: PPO | Admitting: Obstetrics and Gynecology

## 2019-06-29 DIAGNOSIS — R102 Pelvic and perineal pain: Secondary | ICD-10-CM | POA: Diagnosis not present

## 2019-06-29 DIAGNOSIS — N858 Other specified noninflammatory disorders of uterus: Secondary | ICD-10-CM

## 2019-06-29 DIAGNOSIS — N950 Postmenopausal bleeding: Secondary | ICD-10-CM

## 2019-06-29 NOTE — Progress Notes (Signed)
TELEHEALTH GYNECOLOGY VIRTUAL VIDEO VISIT ENCOUNTER NOTE  Provider location: [facility] at [place]   I connected with [name] on 88/41/66 at  9:15 AM EDT by WebEx Video Encounter at home and verified that I am speaking with the correct person using two identifiers.   I discussed the limitations, risks, security and privacy concerns of performing an evaluation and management service virtually and the availability of in person appointments. I also discussed with the patient that there may be a patient responsible charge related to this service. The patient expressed understanding and agreed to proceed.   History:  Jamie Harding is a 63 y.o. [phone] female being evaluated today for ongoing vaginal bleeding. Started in February, has had negative EMB. Still having bleeding once every week or two, bright red bleeding. Enough to fill a panty liner but not a pad's worth. Still having bleeding every time she has intercourse, fills a panty liner and bright red. Still having pain with intercourse, has used KY, multiple lubes with no improvement. Pain is within the vagina as well as with deep thrusting, does think some of the pain is due to dryness. No issues with bowel movements or urination but does have frequent UTIs.    Past Medical History:  Diagnosis Date  . Anxiety   . Asthma   . Depression   . Essential hypertension   . Fatty liver   . Gastritis   . Gastroparesis   . GERD (gastroesophageal reflux disease)   . Hiatal hernia   . History of cardiac catheterization    Minimal coronary atherosclerosis February 2016  . History of kidney stones   . History of stroke 2008  . HLD (hyperlipidemia)   . Neuropathy   . Obesity   . Pancreatitis   . Rheumatoid arthritis(714.0)   . Tibialis tendinitis   . Type 2 diabetes mellitus ([place])    Past Surgical History:  Procedure Laterality Date  . ANKLE SURGERY     remove extra bone-left  . CARPAL TUNNEL RELEASE     right side  . CARPAL TUNNEL RELEASE Left 02/06/2013   Procedure: CARPAL TUNNEL RELEASE ;  Surgeon: Carole Civil, MD;  Location: [facility];  Service: Orthopedics;  Laterality: Left;  procedure end 1135  . CARPAL TUNNEL RELEASE Right 09/14/2015   Procedure: RIGHT CARPAL TUNNEL RELEASE;  Surgeon: Carole Civil, MD;  Location: [facility];  Service: Orthopedics;  Laterality: Right;  . CARPAL TUNNEL RELEASE Left 09/28/2015   Procedure: LEFT CARPAL TUNNEL RELEASE;  Surgeon: Carole Civil, MD;  Location: [facility];  Service: Orthopedics;  Laterality: Left;  procedure 1  . CESAREAN SECTION    . CHOLECYSTECTOMY    . COLONOSCOPY WITH PROPOFOL  09/16/2012   FUX:NATF sessile polyps ranging between 3-4mm in size were found in the sigmoid colon and rectum; polypectomy was performed/Mild diverticulosis was noted throughout the entire examined colon/Small internal hemorrhoids  . DILATION AND CURETTAGE OF UTERUS     multiple  . ESOPHAGOGASTRODUODENOSCOPY  07/29/2009   Mild gastritis, benign path  . ESOPHAGOGASTRODUODENOSCOPY (EGD) WITH PROPOFOL  09/16/2012   SLF:Non-erosive gastritis (inflammation) was found; multiple bx/The duodenal mucosa showed no abnormalities in the ampulla and bulb and second portion of the duodenum/NAUSEA/VOMITING MOST LIKELY DUE TO GERD/GASTRITIS  . FINGER SURGERY Left    left little finger-otif of finger  . Gastric Emptying  07/27/2009   The amount of activity in the stomach at 120 minutes was 13% which is  in the normal range  . LASER ABLATION OF THE CERVIX    . LEFT AND RIGHT HEART CATHETERIZATION WITH CORONARY ANGIOGRAM N/A 01/03/2015   Procedure: LEFT AND RIGHT HEART CATHETERIZATION WITH CORONARY ANGIOGRAM;  Surgeon: Blane Ohara, MD;  Location: [facility] CATH LAB;  Service: Cardiovascular;  Laterality: N/A;  . LITHOTRIPSY    . POLYPECTOMY  09/16/2012   Procedure: POLYPECTOMY;  Surgeon: Danie Binder, MD;  Location: [facility];  Service: Endoscopy;  Laterality: N/A;  . SAVORY  DILATION  09/16/2012   Procedure: SAVORY DILATION;  Surgeon: Danie Binder, MD;  Location: [facility];  Service: Endoscopy;  Laterality: N/A;  16 fr dilation  . TRIGGER FINGER RELEASE Left 02/06/2013   Procedure: RELEASE TRIGGER FINGER LEFT LONG FINGER/A-1 PULLEY;  Surgeon: Carole Civil, MD;  Location: [facility];  Service: Orthopedics;  Laterality: Left;  procedure began 1136  . TRIGGER FINGER RELEASE Right 09/14/2015   Procedure: RIGHT LONG FINGER TRIGGER RELEASE;  Surgeon: Carole Civil, MD;  Location: [facility];  Service: Orthopedics;  Laterality: Right;  . TRIGGER FINGER RELEASE Left 09/28/2015   Procedure: LEFT RING TRIGGER FINGER RELEASE;  Surgeon: Carole Civil, MD;  Location: [facility];  Service: Orthopedics;  Laterality: Left;  left ring finger  . TUBAL LIGATION     The following portions of the patient's history were reviewed and updated as appropriate: allergies, current medications, past family history, past medical history, past social history, past surgical history and problem list.   Health Maintenance:  Normal pap and negative HRHPV on 12/2018.  Normal mammogram on 12/2018.   Review of Systems:  Pertinent items noted in HPI and remainder of comprehensive ROS otherwise negative.  Physical Exam:   General:  Alert, oriented and cooperative. Patient appears to be in no acute distress.  Mental Status: Normal mood and affect. Normal behavior. Normal judgment and thought content.   Respiratory: Normal respiratory effort, no problems with respiration noted  Rest of physical exam deferred due to type of encounter  Labs and Imaging No results found for this or any previous visit (from the past 336 hour(s)). No results found.     Assessment and Plan:   1. Vaginal pain No improvement in pain with intercourse with lubrication, will discuss alternative therapy post surgery  2. Post-menopausal bleeding Patient with persistent bleeding both with and without intercourse. [name] with  finding of uterine mass approx 1.5 x 1.9 x 1.8 cm in upper fundus. Neg EMB.  reviewed with Gyn Onc, who recommended hysteroscopic sampling of lesion to rule out malignancy, given that patient has had ongoing bleeding. Reviewed recommendation with patient who is agreeable to plan. Reviewed hysteroscopic D&C, risks/benefits and she would like to proceed. She has several significant co-morbidities and will need clearance from anesthesia.  - return for preop visit 2 weeks prior to surgery  3. Uterine mass See above   I discussed the assessment and treatment plan with the patient. The patient was provided an opportunity to ask questions and all were answered. The patient agreed with the plan and demonstrated an understanding of the instructions.   The patient was advised to call back or seek an in-person evaluation/go to the ED if the symptoms worsen or if the condition fails to improve as anticipated.  I provided 20 minutes of face-to-face time during this encounter.   Sloan Leiter, MD [facility] for [place], [place]

## 2019-06-29 NOTE — Progress Notes (Signed)
Pt states occasional bleeding and cramping. Pt states still having pain with intercourse.

## 2019-07-01 ENCOUNTER — Encounter: Payer: Self-pay | Admitting: Obstetrics and Gynecology

## 2019-07-13 ENCOUNTER — Encounter: Payer: Self-pay | Admitting: Family Medicine

## 2019-07-14 ENCOUNTER — Telehealth: Payer: Self-pay | Admitting: Family Medicine

## 2019-07-14 ENCOUNTER — Encounter: Payer: Self-pay | Admitting: Family Medicine

## 2019-07-14 NOTE — Telephone Encounter (Signed)
Letter up front for pick up. 

## 2019-07-14 NOTE — Telephone Encounter (Signed)
The patient requested a letter I dictated a letter Please print it off I will be happy to sign it Please give it to the patient this afternoon she needs it for a upcoming custody case

## 2019-07-30 ENCOUNTER — Other Ambulatory Visit: Payer: Self-pay

## 2019-07-30 ENCOUNTER — Encounter (HOSPITAL_BASED_OUTPATIENT_CLINIC_OR_DEPARTMENT_OTHER): Payer: Self-pay | Admitting: *Deleted

## 2019-07-30 NOTE — Progress Notes (Signed)
Pt's history reviewed with Dr Kalman Shan, no further work up or clearance needed prior to [employer]. She will come for PAT appt, labs and EKG on 9/15

## 2019-08-01 ENCOUNTER — Other Ambulatory Visit (HOSPITAL_COMMUNITY)
Admission: RE | Admit: 2019-08-01 | Discharge: 2019-08-01 | Disposition: A | Discharge status: Home / Self Care | Payer: PPO | Source: Ambulatory Visit | Attending: Obstetrics and Gynecology | Admitting: Obstetrics and Gynecology

## 2019-08-01 DIAGNOSIS — Z01812 Encounter for preprocedural laboratory examination: Secondary | ICD-10-CM | POA: Diagnosis not present

## 2019-08-01 DIAGNOSIS — Z20828 Contact with and (suspected) exposure to other viral communicable diseases: Secondary | ICD-10-CM | POA: Diagnosis not present

## 2019-08-02 LAB — NOVEL CORONAVIRUS, NAA (HOSP ORDER, SEND-OUT TO REF LAB; TAT 18-24 HRS): SARS-CoV-2, NAA: NOT DETECTED

## 2019-08-03 ENCOUNTER — Encounter: Payer: Self-pay | Admitting: Family Medicine

## 2019-08-04 ENCOUNTER — Encounter (HOSPITAL_BASED_OUTPATIENT_CLINIC_OR_DEPARTMENT_OTHER)
Admission: RE | Admit: 2019-08-04 | Discharge: 2019-08-04 | Disposition: A | Discharge status: Home / Self Care | Payer: PPO | Source: Ambulatory Visit | Attending: Obstetrics and Gynecology | Admitting: Obstetrics and Gynecology

## 2019-08-04 ENCOUNTER — Other Ambulatory Visit: Payer: Self-pay

## 2019-08-04 DIAGNOSIS — Z79899 Other long term (current) drug therapy: Secondary | ICD-10-CM | POA: Diagnosis not present

## 2019-08-04 DIAGNOSIS — Z8673 Personal history of transient ischemic attack (TIA), and cerebral infarction without residual deficits: Secondary | ICD-10-CM | POA: Diagnosis not present

## 2019-08-04 DIAGNOSIS — Z01812 Encounter for preprocedural laboratory examination: Secondary | ICD-10-CM | POA: Insufficient documentation

## 2019-08-04 DIAGNOSIS — N840 Polyp of corpus uteri: Secondary | ICD-10-CM | POA: Diagnosis not present

## 2019-08-04 DIAGNOSIS — K3184 Gastroparesis: Secondary | ICD-10-CM | POA: Diagnosis not present

## 2019-08-04 DIAGNOSIS — E669 Obesity, unspecified: Secondary | ICD-10-CM | POA: Diagnosis not present

## 2019-08-04 DIAGNOSIS — Z794 Long term (current) use of insulin: Secondary | ICD-10-CM | POA: Diagnosis not present

## 2019-08-04 DIAGNOSIS — Z7951 Long term (current) use of inhaled steroids: Secondary | ICD-10-CM | POA: Diagnosis not present

## 2019-08-04 DIAGNOSIS — Z86010 Personal history of colonic polyps: Secondary | ICD-10-CM | POA: Diagnosis not present

## 2019-08-04 DIAGNOSIS — F419 Anxiety disorder, unspecified: Secondary | ICD-10-CM | POA: Diagnosis not present

## 2019-08-04 DIAGNOSIS — K760 Fatty (change of) liver, not elsewhere classified: Secondary | ICD-10-CM | POA: Diagnosis not present

## 2019-08-04 DIAGNOSIS — Z7982 Long term (current) use of aspirin: Secondary | ICD-10-CM | POA: Diagnosis not present

## 2019-08-04 DIAGNOSIS — I10 Essential (primary) hypertension: Secondary | ICD-10-CM | POA: Diagnosis not present

## 2019-08-04 DIAGNOSIS — I2510 Atherosclerotic heart disease of native coronary artery without angina pectoris: Secondary | ICD-10-CM | POA: Diagnosis not present

## 2019-08-04 DIAGNOSIS — J45909 Unspecified asthma, uncomplicated: Secondary | ICD-10-CM | POA: Diagnosis not present

## 2019-08-04 DIAGNOSIS — N938 Other specified abnormal uterine and vaginal bleeding: Secondary | ICD-10-CM | POA: Diagnosis present

## 2019-08-04 DIAGNOSIS — E785 Hyperlipidemia, unspecified: Secondary | ICD-10-CM | POA: Diagnosis not present

## 2019-08-04 DIAGNOSIS — E1140 Type 2 diabetes mellitus with diabetic neuropathy, unspecified: Secondary | ICD-10-CM | POA: Diagnosis not present

## 2019-08-04 DIAGNOSIS — Z87442 Personal history of urinary calculi: Secondary | ICD-10-CM | POA: Diagnosis not present

## 2019-08-04 DIAGNOSIS — M069 Rheumatoid arthritis, unspecified: Secondary | ICD-10-CM | POA: Diagnosis not present

## 2019-08-04 DIAGNOSIS — N939 Abnormal uterine and vaginal bleeding, unspecified: Secondary | ICD-10-CM | POA: Diagnosis not present

## 2019-08-04 DIAGNOSIS — K219 Gastro-esophageal reflux disease without esophagitis: Secondary | ICD-10-CM | POA: Diagnosis not present

## 2019-08-04 DIAGNOSIS — Z6837 Body mass index (BMI) 37.0-37.9, adult: Secondary | ICD-10-CM | POA: Diagnosis not present

## 2019-08-04 DIAGNOSIS — Z7984 Long term (current) use of oral hypoglycemic drugs: Secondary | ICD-10-CM | POA: Diagnosis not present

## 2019-08-04 DIAGNOSIS — F329 Major depressive disorder, single episode, unspecified: Secondary | ICD-10-CM | POA: Diagnosis not present

## 2019-08-04 DIAGNOSIS — Z9049 Acquired absence of other specified parts of digestive tract: Secondary | ICD-10-CM | POA: Diagnosis not present

## 2019-08-04 LAB — COMPREHENSIVE METABOLIC PANEL
ALT: 19 U/L (ref 0–44)
AST: 25 U/L (ref 15–41)
Albumin: 3.5 g/dL (ref 3.5–5.0)
Alkaline Phosphatase: 61 U/L (ref 38–126)
Anion gap: 12 (ref 5–15)
BUN: 8 mg/dL (ref 8–23)
CO2: 24 mmol/L (ref 22–32)
Calcium: 9 mg/dL (ref 8.9–10.3)
Chloride: 102 mmol/L (ref 98–111)
Creatinine, Ser: 0.96 mg/dL (ref 0.44–1.00)
GFR calc Af Amer: >60 mL/min (ref >60)
GFR calc non Af Amer: >60 mL/min (ref >60)
Glucose, Bld: 268 mg/dL — ABNORMAL HIGH (ref 70–99)
Potassium: 4.7 mmol/L (ref 3.5–5.1)
Sodium: 138 mmol/L (ref 135–145)
Total Bilirubin: 0.6 mg/dL (ref 0.3–1.2)
Total Protein: 7.6 g/dL (ref 6.5–8.1)

## 2019-08-04 LAB — CBC
HCT: 39 % (ref 36.0–46.0)
Hemoglobin: 13.8 g/dL (ref 12.0–15.0)
MCH: 30.3 pg (ref 26.0–34.0)
MCHC: 35.4 g/dL (ref 30.0–36.0)
MCV: 85.5 fL (ref 80.0–100.0)
Platelets: 310 10*3/uL (ref 150–400)
RBC: 4.56 MIL/uL (ref 3.87–5.11)
RDW: 12.6 % (ref 11.5–15.5)
WBC: 10.5 10*3/uL (ref 4.0–10.5)
nRBC: 0 % (ref 0.0–0.2)

## 2019-08-04 LAB — TYPE AND SCREEN
ABO/RH(D): B POS
Antibody Screen: NEGATIVE

## 2019-08-04 LAB — POCT PREGNANCY, URINE: Preg Test, Ur: NEGATIVE

## 2019-08-04 LAB — PROTIME-INR
INR: 1 (ref 0.8–1.2)
Prothrombin Time: 13.2 seconds (ref 11.4–15.2)

## 2019-08-04 LAB — ABO/RH: ABO/RH(D): B POS

## 2019-08-04 LAB — APTT: aPTT: 31 seconds (ref 24–36)

## 2019-08-04 NOTE — Anesthesia Preprocedure Evaluation (Addendum)
Anesthesia Evaluation  Patient identified by MRN, date of birth, ID band Patient awake    Reviewed: Allergy & Precautions, NPO status , Patient's Chart, lab work & pertinent test results  Airway Mallampati: II  TM Distance: >3 FB Neck ROM: Full    Dental no notable dental hx. (+) Teeth Intact   Pulmonary asthma , former smoker,    Pulmonary exam normal breath sounds clear to auscultation       Cardiovascular hypertension, Pt. on medications Normal cardiovascular exam Rhythm:Regular Rate:Normal     Neuro/Psych Depression    GI/Hepatic Neg liver ROS, GERD  ,  Endo/Other  diabetes, Well Controlled, Type 2  Renal/GU negative Renal ROS  negative genitourinary   Musculoskeletal negative musculoskeletal ROS (+)   Abdominal (+) + obese,   Peds  Hematology   Anesthesia Other Findings   Reproductive/Obstetrics                            Anesthesia Physical Anesthesia Plan  ASA: III  Anesthesia Plan: General   Post-op Pain Management:    Induction: Intravenous  PONV Risk Score and Plan: 4 or greater and Treatment may vary due to age or medical condition, Ondansetron, Dexamethasone and Midazolam  Airway Management Planned: LMA  Additional Equipment: None  Intra-op Plan:   Post-operative Plan:   Informed Consent: I have reviewed the patients History and Physical, chart, labs and discussed the procedure including the risks, benefits and alternatives for the proposed anesthesia with the patient or authorized representative who has indicated his/her understanding and acceptance.     Dental advisory given  Plan Discussed with:   Anesthesia Plan Comments:        Anesthesia Quick Evaluation

## 2019-08-04 NOTE — Progress Notes (Signed)
      Enhanced Recovery after Surgery  Enhanced Recovery after Surgery is a protocol used to improve the stress on your body and your recovery after surgery.  Patient Instructions  . The night before surgery:  o No food after midnight. ONLY clear liquids after midnight  . The day of surgery (if you do NOT have diabetes):  o Drink ONE (1) Pre-Surgery Clear Ensure as directed.   o This drink was given to you during your hospital  pre-op appointment visit. o The pre-op nurse will instruct you on the time to drink the  Pre-Surgery Ensure depending on your surgery time. o Finish the drink at the designated time by the pre-op nurse.  o Nothing else to drink after completing the  Pre-Surgery Clear Ensure.  . The day of surgery (if you have diabetes): o Drink ONE (1) Gatorade 2 (G2) as directed. o This drink was given to you during your hospital  pre-op appointment visit.  o The pre-op nurse will instruct you on the time to drink the   Gatorade 2 (G2) depending on your surgery time. o Color of the Gatorade may vary. Red is not allowed. o Nothing else to drink after completing the  Gatorade 2 (G2).         If you have questions, please contact your surgeon's office. 

## 2019-08-05 ENCOUNTER — Other Ambulatory Visit: Payer: Self-pay

## 2019-08-05 ENCOUNTER — Ambulatory Visit (HOSPITAL_BASED_OUTPATIENT_CLINIC_OR_DEPARTMENT_OTHER): Payer: PPO | Admitting: Anesthesiology

## 2019-08-05 ENCOUNTER — Encounter (HOSPITAL_BASED_OUTPATIENT_CLINIC_OR_DEPARTMENT_OTHER): Payer: Self-pay | Admitting: Certified Registered"

## 2019-08-05 ENCOUNTER — Encounter (HOSPITAL_BASED_OUTPATIENT_CLINIC_OR_DEPARTMENT_OTHER)
Admission: RE | Disposition: A | Discharge status: Home / Self Care | Payer: Self-pay | Source: Home / Self Care | Attending: Obstetrics and Gynecology

## 2019-08-05 ENCOUNTER — Ambulatory Visit (HOSPITAL_BASED_OUTPATIENT_CLINIC_OR_DEPARTMENT_OTHER)
Admission: RE | Admit: 2019-08-05 | Discharge: 2019-08-05 | Disposition: A | Discharge status: Home / Self Care | Payer: PPO | Attending: Obstetrics and Gynecology | Admitting: Obstetrics and Gynecology

## 2019-08-05 DIAGNOSIS — E1140 Type 2 diabetes mellitus with diabetic neuropathy, unspecified: Secondary | ICD-10-CM | POA: Diagnosis not present

## 2019-08-05 DIAGNOSIS — Z888 Allergy status to other drugs, medicaments and biological substances status: Secondary | ICD-10-CM | POA: Insufficient documentation

## 2019-08-05 DIAGNOSIS — J45909 Unspecified asthma, uncomplicated: Secondary | ICD-10-CM | POA: Diagnosis not present

## 2019-08-05 DIAGNOSIS — N939 Abnormal uterine and vaginal bleeding, unspecified: Secondary | ICD-10-CM | POA: Insufficient documentation

## 2019-08-05 DIAGNOSIS — Z885 Allergy status to narcotic agent status: Secondary | ICD-10-CM | POA: Insufficient documentation

## 2019-08-05 DIAGNOSIS — K760 Fatty (change of) liver, not elsewhere classified: Secondary | ICD-10-CM | POA: Insufficient documentation

## 2019-08-05 DIAGNOSIS — Z803 Family history of malignant neoplasm of breast: Secondary | ICD-10-CM | POA: Insufficient documentation

## 2019-08-05 DIAGNOSIS — F329 Major depressive disorder, single episode, unspecified: Secondary | ICD-10-CM | POA: Diagnosis not present

## 2019-08-05 DIAGNOSIS — Z9049 Acquired absence of other specified parts of digestive tract: Secondary | ICD-10-CM | POA: Insufficient documentation

## 2019-08-05 DIAGNOSIS — Z7984 Long term (current) use of oral hypoglycemic drugs: Secondary | ICD-10-CM | POA: Insufficient documentation

## 2019-08-05 DIAGNOSIS — I2510 Atherosclerotic heart disease of native coronary artery without angina pectoris: Secondary | ICD-10-CM | POA: Insufficient documentation

## 2019-08-05 DIAGNOSIS — Z880 Allergy status to penicillin: Secondary | ICD-10-CM | POA: Insufficient documentation

## 2019-08-05 DIAGNOSIS — I10 Essential (primary) hypertension: Secondary | ICD-10-CM | POA: Insufficient documentation

## 2019-08-05 DIAGNOSIS — Z86010 Personal history of colonic polyps: Secondary | ICD-10-CM | POA: Insufficient documentation

## 2019-08-05 DIAGNOSIS — Z8261 Family history of arthritis: Secondary | ICD-10-CM | POA: Insufficient documentation

## 2019-08-05 DIAGNOSIS — N938 Other specified abnormal uterine and vaginal bleeding: Secondary | ICD-10-CM

## 2019-08-05 DIAGNOSIS — Z8673 Personal history of transient ischemic attack (TIA), and cerebral infarction without residual deficits: Secondary | ICD-10-CM | POA: Insufficient documentation

## 2019-08-05 DIAGNOSIS — Z87442 Personal history of urinary calculi: Secondary | ICD-10-CM | POA: Insufficient documentation

## 2019-08-05 DIAGNOSIS — E785 Hyperlipidemia, unspecified: Secondary | ICD-10-CM | POA: Insufficient documentation

## 2019-08-05 DIAGNOSIS — N840 Polyp of corpus uteri: Secondary | ICD-10-CM | POA: Diagnosis not present

## 2019-08-05 DIAGNOSIS — K3184 Gastroparesis: Secondary | ICD-10-CM | POA: Diagnosis not present

## 2019-08-05 DIAGNOSIS — Z7982 Long term (current) use of aspirin: Secondary | ICD-10-CM | POA: Insufficient documentation

## 2019-08-05 DIAGNOSIS — M069 Rheumatoid arthritis, unspecified: Secondary | ICD-10-CM | POA: Insufficient documentation

## 2019-08-05 DIAGNOSIS — Z87891 Personal history of nicotine dependence: Secondary | ICD-10-CM | POA: Insufficient documentation

## 2019-08-05 DIAGNOSIS — Z79899 Other long term (current) drug therapy: Secondary | ICD-10-CM | POA: Insufficient documentation

## 2019-08-05 DIAGNOSIS — Z8249 Family history of ischemic heart disease and other diseases of the circulatory system: Secondary | ICD-10-CM | POA: Insufficient documentation

## 2019-08-05 DIAGNOSIS — Z825 Family history of asthma and other chronic lower respiratory diseases: Secondary | ICD-10-CM | POA: Insufficient documentation

## 2019-08-05 DIAGNOSIS — K219 Gastro-esophageal reflux disease without esophagitis: Secondary | ICD-10-CM | POA: Insufficient documentation

## 2019-08-05 DIAGNOSIS — Z7951 Long term (current) use of inhaled steroids: Secondary | ICD-10-CM | POA: Insufficient documentation

## 2019-08-05 DIAGNOSIS — Z6837 Body mass index (BMI) 37.0-37.9, adult: Secondary | ICD-10-CM | POA: Insufficient documentation

## 2019-08-05 DIAGNOSIS — F419 Anxiety disorder, unspecified: Secondary | ICD-10-CM | POA: Insufficient documentation

## 2019-08-05 DIAGNOSIS — Z881 Allergy status to other antibiotic agents status: Secondary | ICD-10-CM | POA: Insufficient documentation

## 2019-08-05 DIAGNOSIS — Z882 Allergy status to sulfonamides status: Secondary | ICD-10-CM | POA: Insufficient documentation

## 2019-08-05 DIAGNOSIS — Z794 Long term (current) use of insulin: Secondary | ICD-10-CM | POA: Insufficient documentation

## 2019-08-05 DIAGNOSIS — Z833 Family history of diabetes mellitus: Secondary | ICD-10-CM | POA: Insufficient documentation

## 2019-08-05 DIAGNOSIS — E669 Obesity, unspecified: Secondary | ICD-10-CM | POA: Insufficient documentation

## 2019-08-05 HISTORY — PX: DILATATION & CURETTAGE/HYSTEROSCOPY WITH MYOSURE: SHX6511

## 2019-08-05 LAB — GLUCOSE, CAPILLARY
Glucose-Capillary: 168 mg/dL — ABNORMAL HIGH (ref 70–99)
Glucose-Capillary: 130 mg/dL — ABNORMAL HIGH (ref 70–99)

## 2019-08-05 SURGERY — DILATATION & CURETTAGE/HYSTEROSCOPY WITH MYOSURE
Anesthesia: General

## 2019-08-05 MED ORDER — SODIUM CHLORIDE 0.9 % IR SOLN
Status: DC | PRN
  Administered 2019-08-05: 3000 mL

## 2019-08-05 MED ORDER — LACTATED RINGERS IV SOLN
INTRAVENOUS | Status: DC
  Administered 2019-08-05: 13:00:00 via INTRAVENOUS

## 2019-08-05 MED ORDER — LACTATED RINGERS IV SOLN: INTRAVENOUS | Status: DC

## 2019-08-05 MED ORDER — OXYCODONE HCL 5 MG/5ML PO SOLN: 5.0000 mg | Freq: Once | ORAL | Status: DC | PRN

## 2019-08-05 MED ORDER — ONDANSETRON HCL 4 MG/2ML IJ SOLN
INTRAMUSCULAR | Status: AC
  Filled 2019-08-05: qty 2

## 2019-08-05 MED ORDER — HYDROMORPHONE HCL 1 MG/ML IJ SOLN
INTRAMUSCULAR | Status: AC
  Filled 2019-08-05: qty 0.5

## 2019-08-05 MED ORDER — MIDAZOLAM HCL 5 MG/5ML IJ SOLN
INTRAMUSCULAR | Status: DC | PRN
  Administered 2019-08-05: 2 mg via INTRAVENOUS

## 2019-08-05 MED ORDER — MIDAZOLAM HCL 2 MG/2ML IJ SOLN: 1.0000 mg | INTRAMUSCULAR | Status: DC | PRN

## 2019-08-05 MED ORDER — SOD CITRATE-CITRIC ACID 500-334 MG/5ML PO SOLN
ORAL | Status: AC
  Filled 2019-08-05: qty 30

## 2019-08-05 MED ORDER — GABAPENTIN 300 MG PO CAPS
ORAL_CAPSULE | ORAL | Status: AC
  Filled 2019-08-05: qty 1

## 2019-08-05 MED ORDER — DEXAMETHASONE SODIUM PHOSPHATE 10 MG/ML IJ SOLN
INTRAMUSCULAR | Status: DC | PRN
  Administered 2019-08-05: 5 mg via INTRAVENOUS

## 2019-08-05 MED ORDER — ACETAMINOPHEN 500 MG PO TABS
ORAL_TABLET | ORAL | Status: AC
  Filled 2019-08-05: qty 2

## 2019-08-05 MED ORDER — PHENYLEPHRINE 40 MCG/ML (10ML) SYRINGE FOR IV PUSH (FOR BLOOD PRESSURE SUPPORT)
PREFILLED_SYRINGE | INTRAVENOUS | Status: DC | PRN
  Administered 2019-08-05: 80 ug via INTRAVENOUS

## 2019-08-05 MED ORDER — GABAPENTIN 300 MG PO CAPS
300.0000 mg | ORAL_CAPSULE | Freq: Once | ORAL | Status: AC
  Administered 2019-08-05: 300 mg via ORAL

## 2019-08-05 MED ORDER — MEPERIDINE HCL 25 MG/ML IJ SOLN: 6.2500 mg | INTRAMUSCULAR | Status: DC | PRN

## 2019-08-05 MED ORDER — ACETAMINOPHEN 500 MG PO TABS
1000.0000 mg | ORAL_TABLET | Freq: Once | ORAL | Status: AC
  Administered 2019-08-05: 12:00:00 1000 mg via ORAL

## 2019-08-05 MED ORDER — ONDANSETRON HCL 4 MG/2ML IJ SOLN: 4.0000 mg | Freq: Once | INTRAMUSCULAR | Status: DC | PRN

## 2019-08-05 MED ORDER — LIDOCAINE 2% (20 MG/ML) 5 ML SYRINGE
INTRAMUSCULAR | Status: AC
  Filled 2019-08-05: qty 5

## 2019-08-05 MED ORDER — ONDANSETRON HCL 4 MG/2ML IJ SOLN
INTRAMUSCULAR | Status: DC | PRN
  Administered 2019-08-05: 4 mg via INTRAVENOUS

## 2019-08-05 MED ORDER — OXYCODONE HCL 5 MG PO TABS: 5.0000 mg | ORAL_TABLET | Freq: Once | ORAL | Status: DC | PRN

## 2019-08-05 MED ORDER — SOD CITRATE-CITRIC ACID 500-334 MG/5ML PO SOLN
30.0000 mL | ORAL | Status: AC
  Administered 2019-08-05: 12:00:00 30 mL via ORAL

## 2019-08-05 MED ORDER — FENTANYL CITRATE (PF) 100 MCG/2ML IJ SOLN: 50.0000 ug | INTRAMUSCULAR | Status: DC | PRN

## 2019-08-05 MED ORDER — MIDAZOLAM HCL 2 MG/2ML IJ SOLN
INTRAMUSCULAR | Status: AC
  Filled 2019-08-05: qty 2

## 2019-08-05 MED ORDER — DEXAMETHASONE SODIUM PHOSPHATE 10 MG/ML IJ SOLN
INTRAMUSCULAR | Status: AC
  Filled 2019-08-05: qty 2

## 2019-08-05 MED ORDER — PROPOFOL 10 MG/ML IV BOLUS
INTRAVENOUS | Status: DC | PRN
  Administered 2019-08-05: 50 mg via INTRAVENOUS
  Administered 2019-08-05: 150 mg via INTRAVENOUS

## 2019-08-05 MED ORDER — HYDROCODONE-ACETAMINOPHEN 5-325 MG PO TABS: 1.0000 | ORAL_TABLET | Freq: Four times a day (QID) | ORAL | 0 refills | Status: DC | PRN

## 2019-08-05 MED ORDER — HYDROMORPHONE HCL 1 MG/ML IJ SOLN
0.2500 mg | INTRAMUSCULAR | Status: DC | PRN
  Administered 2019-08-05: 0.5 mg via INTRAVENOUS

## 2019-08-05 MED ORDER — ACETAMINOPHEN 10 MG/ML IV SOLN: 1000.0000 mg | Freq: Once | INTRAVENOUS | Status: DC | PRN

## 2019-08-05 MED ORDER — LIDOCAINE 2% (20 MG/ML) 5 ML SYRINGE
INTRAMUSCULAR | Status: DC | PRN
  Administered 2019-08-05: 100 mg via INTRAVENOUS

## 2019-08-05 MED ORDER — PROPOFOL 10 MG/ML IV BOLUS
INTRAVENOUS | Status: AC
  Filled 2019-08-05: qty 20

## 2019-08-05 MED ORDER — FENTANYL CITRATE (PF) 100 MCG/2ML IJ SOLN
INTRAMUSCULAR | Status: AC
  Filled 2019-08-05: qty 2

## 2019-08-05 MED ORDER — FENTANYL CITRATE (PF) 250 MCG/5ML IJ SOLN
INTRAMUSCULAR | Status: DC | PRN
  Administered 2019-08-05 (×2): 50 ug via INTRAVENOUS

## 2019-08-05 SURGICAL SUPPLY — 18 items
BRIEF STRETCH FOR OB PAD XXL (UNDERPADS AND DIAPERS) ×3 IMPLANT
CATH ROBINSON RED A/P 16FR (CATHETERS) ×3 IMPLANT
DEVICE MYOSURE REACH (MISCELLANEOUS) ×2 IMPLANT
GAUZE 4X4 16PLY RFD (DISPOSABLE) ×3 IMPLANT
GLOVE BIO SURGEON STRL SZ 6.5 (GLOVE) ×2 IMPLANT
GLOVE BIO SURGEONS STRL SZ 6.5 (GLOVE) ×1
GLOVE BIOGEL PI IND STRL 6.5 (GLOVE) ×1 IMPLANT
GLOVE BIOGEL PI IND STRL 7.0 (GLOVE) ×1 IMPLANT
GLOVE BIOGEL PI INDICATOR 6.5 (GLOVE) ×2
GLOVE BIOGEL PI INDICATOR 7.0 (GLOVE) ×2
GOWN STRL REUS W/TWL LRG LVL3 (GOWN DISPOSABLE) ×6 IMPLANT
KIT PROCEDURE FLUENT (KITS) ×3 IMPLANT
PACK VAGINAL MINOR WOMEN LF (CUSTOM PROCEDURE TRAY) ×3 IMPLANT
PAD OB MATERNITY 4.3X12.25 (PERSONAL CARE ITEMS) ×3 IMPLANT
PAD PREP 24X48 CUFFED NSTRL (MISCELLANEOUS) ×3 IMPLANT
SEAL ROD LENS SCOPE MYOSURE (ABLATOR) ×3 IMPLANT
SLEEVE SCD COMPRESS KNEE MED (MISCELLANEOUS) ×4 IMPLANT
TOWEL GREEN STERILE FF (TOWEL DISPOSABLE) ×4 IMPLANT

## 2019-08-05 NOTE — H&P (Signed)
OB/GYN Pre-Op History and Physical  Jamie Harding is a 63 y.o. [id number] presenting for D&C, hysteroscopy, polypectomy for post menopausal bleeding. Was having bleeding daily, states, now it is less, not happening daily.       Past Medical History:  Diagnosis Date  . Anxiety   . Asthma   . Depression   . Essential hypertension   . Fatty liver   . Gastritis   . Gastroparesis   . GERD (gastroesophageal reflux disease)   . Hiatal hernia   . History of cardiac catheterization    Minimal coronary atherosclerosis February 2016  . History of kidney stones   . History of stroke 2008  . HLD (hyperlipidemia)   . Neuropathy   . Obesity   . Pancreatitis   . Rheumatoid arthritis(714.0)   . Tibialis tendinitis   . Type 2 diabetes mellitus ([place])     Past Surgical History:  Procedure Laterality Date  . ANKLE SURGERY     remove extra bone-left  . CARPAL TUNNEL RELEASE     right side  . CARPAL TUNNEL RELEASE Left 02/06/2013   Procedure: CARPAL TUNNEL RELEASE ;  Surgeon: Carole Civil, MD;  Location: [facility];  Service: Orthopedics;  Laterality: Left;  procedure end 1135  . CARPAL TUNNEL RELEASE Right 09/14/2015   Procedure: RIGHT CARPAL TUNNEL RELEASE;  Surgeon: Carole Civil, MD;  Location: [facility];  Service: Orthopedics;  Laterality: Right;  . CARPAL TUNNEL RELEASE Left 09/28/2015   Procedure: LEFT CARPAL TUNNEL RELEASE;  Surgeon: Carole Civil, MD;  Location: [facility];  Service: Orthopedics;  Laterality: Left;  procedure 1  . CESAREAN SECTION    . CHOLECYSTECTOMY    . COLONOSCOPY WITH PROPOFOL  09/16/2012   [id number] sessile polyps ranging between 3-32mm in size were found in the sigmoid colon and rectum; polypectomy was performed/Mild diverticulosis was noted throughout the entire examined colon/Small internal hemorrhoids  . DILATION AND CURETTAGE OF UTERUS     multiple  . ESOPHAGOGASTRODUODENOSCOPY  07/29/2009   Mild gastritis, benign path  . ESOPHAGOGASTRODUODENOSCOPY  (EGD) WITH PROPOFOL  09/16/2012   SLF:Non-erosive gastritis (inflammation) was found; multiple bx/The duodenal mucosa showed no abnormalities in the ampulla and bulb and second portion of the duodenum/NAUSEA/VOMITING MOST LIKELY DUE TO GERD/GASTRITIS  . FINGER SURGERY Left    left little finger-otif of finger  . Gastric Emptying  07/27/2009   The amount of activity in the stomach at 120 minutes was 13% which is in the normal range  . LASER ABLATION OF THE CERVIX    . LEFT AND RIGHT HEART CATHETERIZATION WITH CORONARY ANGIOGRAM N/A 01/03/2015   Procedure: LEFT AND RIGHT HEART CATHETERIZATION WITH CORONARY ANGIOGRAM;  Surgeon: Blane Ohara, MD;  Location: [facility] CATH LAB;  Service: Cardiovascular;  Laterality: N/A;  . LITHOTRIPSY    . POLYPECTOMY  09/16/2012   Procedure: POLYPECTOMY;  Surgeon: Danie Binder, MD;  Location: [facility];  Service: Endoscopy;  Laterality: N/A;  . SAVORY DILATION  09/16/2012   Procedure: SAVORY DILATION;  Surgeon: Danie Binder, MD;  Location: [facility];  Service: Endoscopy;  Laterality: N/A;  16 fr dilation  . TRIGGER FINGER RELEASE Left 02/06/2013   Procedure: RELEASE TRIGGER FINGER LEFT LONG FINGER/A-1 PULLEY;  Surgeon: Carole Civil, MD;  Location: [facility];  Service: Orthopedics;  Laterality: Left;  procedure began 1136  . TRIGGER FINGER RELEASE Right 09/14/2015   Procedure: RIGHT LONG FINGER TRIGGER RELEASE;  Surgeon: Carole Civil, MD;  Location:  [facility];  Service: Orthopedics;  Laterality: Right;  . TRIGGER FINGER RELEASE Left 09/28/2015   Procedure: LEFT RING TRIGGER FINGER RELEASE;  Surgeon: Carole Civil, MD;  Location: [facility];  Service: Orthopedics;  Laterality: Left;  left ring finger  . TUBAL LIGATION      OB History  Gravida Para Term Preterm AB Living  7 1 1   6 1   SAB TAB Ectopic Multiple Live Births  6       1    # Outcome Date GA Lbr Len/2nd Weight Sex Delivery Anes PTL Lv  7 SAB           6 SAB           5 SAB           4 SAB            3 SAB           2 SAB           1 Term      CS-Unspec   LIV    Social History   Socioeconomic History  . Marital status: Married    Spouse name: Not on file  . Number of children: Not on file  . Years of education: Not on file  . Highest education level: Not on file  Occupational History  . Occupation: Chartered certified accountant    Comment: [employer], 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)    Employer: [employer]  Social Needs  . Financial resource strain: Not on file  . Food insecurity    Worry: Not on file    Inability: Not on file  . Transportation needs    Medical: Not on file    Non-medical: Not on file  Tobacco Use  . Smoking status: Former Smoker    Packs/day: 0.50    Years: 3.00    Pack years: 1.50    Types: Cigarettes    Quit date: 09/11/1987    Years since quitting: 31.9  . Smokeless tobacco: Never Used  Substance and Sexual Activity  . Alcohol use: No  . Drug use: No  . Sexual activity: Not Currently    Birth control/protection: Surgical  Lifestyle  . Physical activity    Days per week: Not on file    Minutes per session: Not on file  . Stress: Not on file  Relationships  . Social Herbalist on phone: Not on file    Gets together: Not on file    Attends religious service: Not on file    Active member of club or organization: Not on file    Attends meetings of clubs or organizations: Not on file    Relationship status: Not on file  Other Topics Concern  . Not on file  Social History Narrative  . Not on file    Family History  Problem Relation Age of Onset  . Breast cancer Mother   . Diabetes Father   . Coronary artery disease Other   . Arthritis Other   . Asthma Other   . Diabetes Sister   . Colon cancer Neg Hx     Medications Prior to Admission  Medication Sig Dispense Refill Last Dose  . albuterol (VENTOLIN HFA) 108 (90 Base) MCG/ACT inhaler INHALE 2 PUFFS INTO THE LUNGS EVERY 4 (FOUR) HOURS AS NEEDED FOR WHEEZING. 54 g 5 08/04/2019 at  Unknown time  . amLODipine (NORVASC) 10 MG tablet Take 1 tablet (10 mg  total) by mouth daily. 90 tablet 0 08/04/2019 at Unknown time  . aspirin EC 81 MG tablet Take 81 mg by mouth daily.   08/05/2019 at 0730  . cetirizine (ZYRTEC) 10 MG tablet Take 1 tablet (10 mg total) by mouth daily. 90 tablet 1 08/04/2019 at Unknown time  . cyanocobalamin 1000 MCG tablet Take 1,000 mcg by mouth daily. Reported on 02/02/2016   08/04/2019 at Unknown time  . Ergocalciferol (VITAMIN D2) 2000 UNITS TABS Take by mouth daily.   08/04/2019 at Unknown time  . fluticasone (FLONASE) 50 MCG/ACT nasal spray Place 2 sprays into both nostrils daily. 48 g 3 08/04/2019 at Unknown time  . Fluticasone-Salmeterol (ADVAIR DISKUS) 250-50 MCG/DOSE AEPB INHALE 1 PUFF BY MOUTH 2 TIMES DAILY. 60 each 5 08/05/2019 at 0730  . HYDROcodone-acetaminophen (NORCO) 7.5-325 MG tablet Take 1 tablet by mouth every 6 (six) hours as needed for moderate pain. 120 tablet 0 08/05/2019 at 0730  . insulin detemir (LEVEMIR) 100 UNIT/ML injection 70 units in the am and 60 units qhs 70 units in the am and 60 units qhs 15 mL 1 08/04/2019 at Unknown time  . lisinopril (ZESTRIL) 20 MG tablet TAKE ONE AND ONE-HALF TABLET (30MG  TOTAL) BY MOUTH DAILY 135 tablet 0 08/04/2019 at Unknown time  . metFORMIN (GLUCOPHAGE) 1000 MG tablet TAKE ONE TABLET (1000MG  TOTAL) BY MOUTH TWO TIMES DAILY 180 tablet 0 08/04/2019 at Unknown time  . Multiple Vitamin (MULTIVITAMIN WITH MINERALS) TABS Take 1 tablet by mouth daily.   08/04/2019 at Unknown time  . mupirocin ointment (BACTROBAN) 2 % Apply 1 application topically 2 (two) times daily. 30 g 0 Past Month at Unknown time  . NOVOLOG FLEXPEN 100 UNIT/ML FlexPen Inject 22 Units into the skin 3 (three) times daily with meals. 15 mL 1 08/04/2019 at Unknown time  . ondansetron (ZOFRAN) 8 MG tablet TAKE ONE TABLET BY MOUTH EVERY 12 HOURS AS NEEDED FOR NAUSEA 20 tablet 5 08/04/2019 at Unknown time  . pantoprazole (PROTONIX) 40 MG tablet TAKE ONE TABLET  (40MG  TOTAL) BY MOUTH DAILY 90 tablet 0 08/05/2019 at 0730  . promethazine (PHENERGAN) 25 MG tablet TAKE ONE TABLET BY MOUTH EVERY 8 HOURS AS NEEDED FOR NAUSEA 30 tablet 5 08/04/2019 at Unknown time  . pyridOXINE (VITAMIN B-6) 100 MG tablet Take 100 mg by mouth daily. Reported on 02/02/2016   08/04/2019 at Unknown time  . rosuvastatin (CRESTOR) 10 MG tablet Take 1 tablet (10 mg total) by mouth daily. 90 tablet 0 08/04/2019 at Unknown time  . sitaGLIPtin (JANUVIA) 100 MG tablet Take 1 tablet (100 mg total) by mouth daily. 90 tablet 1 08/04/2019 at Unknown time  . venlafaxine (EFFEXOR) 75 MG tablet Take three tablets daily 270 tablet 1 08/05/2019 at 0730    Allergies  Allergen Reactions  . Byetta 10 Mcg Pen [Exenatide] Diarrhea and Nausea And Vomiting      touch of pancreatis  . Naproxen Nausea And Vomiting  . Augmentin [Amoxicillin-Pot Clavulanate]     diarrhea  . Azithromycin Nausea And Vomiting and Rash  . Erythromycin Hives       . Invokana [Canagliflozin]     Urinary issues,yeast infection  . Morphine Hives and Rash  . Sulfonamide Derivatives Nausea And Vomiting and Rash    Review of Systems: Negative except for what is mentioned in HPI.     Physical Exam: BP 121/65   Pulse 90   Temp 98.7 F (37.1 C) (Oral)   Resp 16   Ht 5'  2" (1.575 m)   Wt 94 kg   SpO2 99%   BMI 37.90 kg/m  CONSTITUTIONAL: Well-developed, well-nourished female in no acute distress.  HENT:  Normocephalic, atraumatic, External right and left ear normal. Oropharynx is clear and moist EYES: Conjunctivae and EOM are normal. Pupils are equal, round, and reactive to light. No scleral icterus.  NECK: Normal range of motion, supple, no masses SKIN: Skin is warm and dry. No rash noted. Not diaphoretic. No erythema. No pallor. [place]: Alert and oriented to person, place, and time. Normal reflexes, muscle tone coordination. No cranial nerve deficit noted. PSYCHIATRIC: Normal mood and affect. Normal behavior. Normal  judgment and thought content. CARDIOVASCULAR: Normal heart rate noted RESPIRATORY: Effort normal, no problems with respiration noted ABDOMEN: Soft, nontender, nondistended, gravid.  PELVIC: Deferred MUSCULOSKELETAL: Normal range of motion. No edema and no tenderness. 2+ distal pulses.   Pertinent Labs/Studies:   Results for orders placed or performed during the hospital encounter of 08/05/19 (from the past 72 hour(s))  Type and screen [facility]     Status: None   Collection Time: 08/04/19 12:00 PM  Result Value Ref Range   ABO/RH(D) B POS    Antibody Screen NEG    Sample Expiration      08/07/2019,2359 Performed at [facility], [place] [address], [place], [place] [address]   CBC     Status: None   Collection Time: 08/04/19  3:00 PM  Result Value Ref Range   WBC 10.5 4.0 - 10.5 K/uL   RBC 4.56 3.87 - 5.11 MIL/uL   Hemoglobin 13.8 12.0 - 15.0 g/dL   HCT 39.0 36.0 - 46.0 %   MCV 85.5 80.0 - 100.0 fL   MCH 30.3 26.0 - 34.0 pg   MCHC 35.4 30.0 - 36.0 g/dL   RDW 12.6 11.5 - 15.5 %   Platelets 310 150 - 400 K/uL   nRBC 0.0 0.0 - 0.2 %    Comment: Performed at [facility], [place] [address], [place], [place] [address]  Comprehensive metabolic panel     Status: Abnormal   Collection Time: 08/04/19  3:00 PM  Result Value Ref Range   Sodium 138 135 - 145 mmol/L   Potassium 4.7 3.5 - 5.1 mmol/L   Chloride 102 98 - 111 mmol/L   CO2 24 22 - 32 mmol/L   Glucose, Bld 268 (H) 70 - 99 mg/dL   BUN 8 8 - 23 mg/dL   Creatinine, Ser 0.96 0.44 - 1.00 mg/dL   Calcium 9.0 8.9 - 10.3 mg/dL   Total Protein 7.6 6.5 - 8.1 g/dL   Albumin 3.5 3.5 - 5.0 g/dL   AST 25 15 - 41 U/L   ALT 19 0 - 44 U/L   Alkaline Phosphatase 61 38 - 126 U/L   Total Bilirubin 0.6 0.3 - 1.2 mg/dL   GFR calc non Af Amer >60 >60 mL/min   GFR calc Af Amer >60 >60 mL/min   Anion gap 12 5 - 15    Comment: Performed at [facility] [address], [place], [place] [address]  Protime-INR      Status: None   Collection Time: 08/04/19  3:00 PM  Result Value Ref Range   Prothrombin Time 13.2 11.4 - 15.2 seconds   INR 1.0 0.8 - 1.2    Comment: (NOTE) INR goal varies based on device and disease states. Performed at [facility], [place],  [place] [address]   APTT     Status: None   Collection Time: 08/04/19  3:00 PM  Result Value Ref Range   aPTT 31 24 - 36 seconds    Comment: Performed at [facility] [address] [place], [place] [address]  Glucose, capillary     Status: Abnormal   Collection Time: 08/05/19 11:53 AM  Result Value Ref Range   Glucose-Capillary 168 (H) 70 - 99 mg/dL       Assessment and Plan :NAKSHATRA KETTINGER is a 63 y.o. [id number] here for hysteroscopy, dilation and curettage and polypectomy. The risks of hysteroscopic polypectomy with D&C were reviewed with the patient; including but not limited to: infection which may require antibiotics; bleeding which may require transfusion or re-operation; injury to bowel, bladder, ureters or other surrounding organs; need for additional procedures; thromboembolic phenomenon and other postoperative/anesthesia complications. Reviewed that all sampling will be sent to pathology and further management based on findings. The patient concurred with the proposed plan, giving informed consent for the procedure. She is agreeable to a blood transfusion in the event of emergency.  Patient is NPO Anesthesia aware SCDs  Admission labs To OR when ready   K. Arvilla Meres, M.D. Attending [place], [facility] for [employer], [place]

## 2019-08-05 NOTE — Anesthesia Postprocedure Evaluation (Signed)
Anesthesia Post Note  Patient: Jamie Harding  Procedure(s) Performed: DILATATION & CURETTAGE/HYSTEROSCOPY WITH MYOSURE (N/A )     Patient location during evaluation: PACU Anesthesia Type: General Level of consciousness: awake and alert Pain management: pain level controlled Vital Signs Assessment: post-procedure vital signs reviewed and stable Respiratory status: spontaneous breathing, nonlabored ventilation, respiratory function stable and patient connected to nasal cannula oxygen Cardiovascular status: blood pressure returned to baseline and stable Postop Assessment: no apparent nausea or vomiting Anesthetic complications: no    Last Vitals:  Vitals:   08/05/19 1345 08/05/19 1433  BP: 112/69 116/66  Pulse:  95  Resp: 19 18  Temp: 36.4 C 36.4 C  SpO2: 97% 98%    Last Pain:  Vitals:   08/05/19 1433  TempSrc: Oral  PainSc: 0-No pain                 Barnet Glasgow

## 2019-08-05 NOTE — Op Note (Signed)
Jamie Harding PROCEDURE DATE: 08/05/2019   PREOPERATIVE DIAGNOSIS:  abnormal uterine bleeding, suspected uterine polyp  POSTOPERATIVE DIAGNOSIS: same  PROCEDURE: Operative Hysteroscopic polypectomy, dilation and curettage  SURGEON:  Dr. Feliz Beam  ASSISTANT:  none  ANESTHESIOLOGY TEAM: Anesthesiologist: Barnet Glasgow, MD CRNA: Myna Bright, CRNA  INDICATIONS: 63 y.o. [phone]  here for scheduled surgery for the aforementioned diagnoses.   Risks of surgery were discussed with the patient including but not limited to: bleeding which may require transfusion; infection which may require antibiotics; injury to uterus or surrounding organs; intrauterine scarring which may impair future fertility; need for additional procedures including laparotomy or laparoscopy; and other postoperative/anesthesia complications. Written informed consent was obtained.    FINDINGS: Normal appearing female genitalia with nulliparous, post-menopausal appearing cervix.  Small, mobile uterus. Atrophic endometrium with thin stalked polyp on anterior uterine wall near fundus. Right ostia normal appearing, left ostia not visible. Otherwise normal appearing uterus.  ANESTHESIA:   General INTRAVENOUS FLUIDS:  700 ml of LR FLUID DEFICITS:  250 ml of normal saline ESTIMATED BLOOD LOSS:  Less than 20 ml URINE OUTPUT: unmeasured amount clear yellow urine SPECIMENS: endometrial polyp, endometrial curettings sent to pathology COMPLICATIONS:  None immediate.  PROCEDURE DETAILS:  The patient was seen in the pre-op area where the plan was reviewed and she again verbalized understanding and consent.  She was then taken to the operating room where general anesthesia was administered.  After an adequate timeout was performed, she was placed in the dorsal lithotomy position and examined; then prepped and draped in the sterile manner. A straight catheter was inserted into the bladder sterilely and the bladder was  drained.  A weighted speculum was then placed in the patient's vagina and a Sims retractor used to visualize the cervix. A single toothed tenaculum was applied to the anterior lip of the cervix.  The cervix then dilated manually with metal dilators to accommodate the 6 mm operative hysteroscope.  Once the cervix was dilated, the hysteroscope was inserted under direct visualization using normal saline as a suspension medium.  The uterine cavity was carefully examined with the findings as noted above. The operative channel was introduced into the cavity and the polyp was removed using the myosure reach device under direct visualization. Once the polyp was removed, the cavity was again viewed and noted to be free of other pathology. The hysteroscope was then removed under direct visualization.  A sharp curettage was then performed to obtain a minimal amount of endometrial curettings. The tenaculum was removed from the anterior lip of the cervix with good hemostasis noted at the tenaculum site. All instruments were remvoed from the vagina.  The patient tolerated the procedure well and was taken to the recovery area awake, extubated and in stable condition.  The patient will be discharged to home as per PACU criteria.  Routine postoperative instructions given.  She was prescribed Norco.  She will follow up in the clinic in 2 weeks for postoperative evaluation.    Feliz Beam, M.D. Attending [place], [facility] for [employer], [place]

## 2019-08-05 NOTE — Anesthesia Procedure Notes (Signed)
Procedure Name: LMA Insertion Date/Time: 08/05/2019 12:45 PM Performed by: Myna Bright, CRNA Pre-anesthesia Checklist: Patient identified, Emergency Drugs available, Suction available and Patient being monitored Patient Re-evaluated:Patient Re-evaluated prior to induction Oxygen Delivery Method: Circle system utilized Preoxygenation: Pre-oxygenation with 100% oxygen Induction Type: IV induction Ventilation: Mask ventilation without difficulty LMA: LMA inserted LMA Size: 4.0 Tube type: Oral Number of attempts: 1 Placement Confirmation: positive ETCO2 and breath sounds checked- equal and bilateral Tube secured with: Tape Dental Injury: Teeth and Oropharynx as per pre-operative assessment

## 2019-08-05 NOTE — Discharge Instructions (Signed)
Dilation and Curettage, Care After These instructions give you information about caring for yourself after your procedure. Your doctor may also give you more specific instructions. Call your doctor if you have any problems or questions after your procedure. Follow these instructions at home: Activity  Do not drive or use heavy machinery while taking prescription pain medicine.  For 24 hours after your procedure, avoid driving.  Take short walks often, followed by rest periods. Ask your doctor what activities are safe for you. After one or two days, you may be able to return to your normal activities.  Do not lift anything that is heavier than 10 lb (4.5 kg) until your doctor approves.  For at least 2 weeks, or as long as told by your doctor: ? Do not douche. ? Do not use tampons. ? Do not have sex. General instructions   Take over-the-counter and prescription medicines only as told by your doctor. This is very important if you take blood thinning medicine.  Do not take baths, swim, or use a hot tub until your doctor approves. Take showers instead of baths.  Wear compression stockings as told by your doctor.  It is up to you to get the results of your procedure. Ask your doctor when your results will be ready.  Keep all follow-up visits as told by your doctor. This is important. Contact a doctor if:  You have very bad cramps that get worse or do not get better with medicine.  You have very bad pain in your belly (abdomen).  You cannot drink fluids without throwing up (vomiting).  You get pain in a different part of the area between your belly and thighs (pelvis).  You have bad-smelling discharge from your vagina.  You have a rash. Get help right away if:  You are bleeding a lot from your vagina. A lot of bleeding means soaking more than one sanitary pad in an hour, for 2 hours in a row.  You have clumps of blood (blood clots) coming from your vagina.  You have a fever  or chills.  Your belly feels very tender or hard.  You have chest pain.  You have trouble breathing.  You cough up blood.  You feel dizzy.  You feel light-headed.  You pass out (faint).  You have pain in your neck or shoulder area. Summary  Take short walks often, followed by rest periods. Ask your doctor what activities are safe for you. After one or two days, you may be able to return to your normal activities.  Do not lift anything that is heavier than 10 lb (4.5 kg) until your doctor approves.  Do not take baths, swim, or use a hot tub until your doctor approves. Take showers instead of baths.  Contact your doctor if you have any symptoms of infection, like bad-smelling discharge from your vagina. This information is not intended to replace advice given to you by your health care provider. Make sure you discuss any questions you have with your health care provider. Document Released: 08/14/2008 Document Revised: 10/18/2017 Document Reviewed: 07/23/2016 Elsevier Patient Education  [place] Instructions  Activity: Get plenty of rest for the remainder of the day. A responsible individual must stay with you for 24 hours following the procedure.  For the next 24 hours, DO NOT: -Drive a car -Paediatric nurse -Drink alcoholic beverages -Take any medication unless instructed by your physician -Make any legal decisions or sign important  papers.  Meals: Start with liquid foods such as gelatin or soup. Progress to regular foods as tolerated. Avoid greasy, spicy, heavy foods. If nausea and/or vomiting occur, drink only clear liquids until the nausea and/or vomiting subsides. Call your physician if vomiting continues.  Special Instructions/Symptoms: Your throat may feel dry or sore from the anesthesia or the breathing tube placed in your throat during surgery. If this causes discomfort, gargle with warm salt water. The discomfort should  disappear within 24 hours.  If you had a scopolamine patch placed behind your ear for the management of post- operative nausea and/or vomiting:  1. The medication in the patch is effective for 72 hours, after which it should be removed.  Wrap patch in a tissue and discard in the trash. Wash hands thoroughly with soap and water. 2. You may remove the patch earlier than 72 hours if you experience unpleasant side effects which may include dry mouth, dizziness or visual disturbances. 3. Avoid touching the patch. Wash your hands with soap and water after contact with the patch.

## 2019-08-05 NOTE — Transfer of Care (Signed)
Immediate Anesthesia Transfer of Care Note  Patient: Edwardine A Ruffner  Procedure(s) Performed: DILATATION & CURETTAGE/HYSTEROSCOPY WITH MYOSURE (N/A )  Patient Location: PACU  Anesthesia Type:General  Level of Consciousness: sedated and responds to stimulation  Airway & Oxygen Therapy: Patient Spontanous Breathing and Patient connected to face mask oxygen  Post-op Assessment: Report given to RN, Post -op Vital signs reviewed and stable and Patient moving all extremities  Post vital signs: Reviewed and stable  Last Vitals:  Vitals Value Taken Time  BP 119/62 08/05/19 1318  Temp    Pulse 93 08/05/19 1319  Resp 20 08/05/19 1319  SpO2 100 % 08/05/19 1319  Vitals shown include unvalidated device data.  Last Pain:  Vitals:   08/05/19 1155  TempSrc: Oral  PainSc: 0-No pain      Patients Stated Pain Goal: 4 ([id number])  Complications: No apparent anesthesia complications

## 2019-08-06 ENCOUNTER — Encounter (HOSPITAL_BASED_OUTPATIENT_CLINIC_OR_DEPARTMENT_OTHER): Payer: Self-pay | Admitting: Obstetrics and Gynecology

## 2019-08-06 LAB — SURGICAL PATHOLOGY

## 2019-08-14 ENCOUNTER — Encounter: Payer: Self-pay | Admitting: Family Medicine

## 2019-08-14 ENCOUNTER — Other Ambulatory Visit: Payer: Self-pay | Admitting: Family Medicine

## 2019-08-14 MED ORDER — HYDROCODONE-ACETAMINOPHEN 7.5-325 MG PO TABS: 1.0000 | ORAL_TABLET | Freq: Four times a day (QID) | ORAL | 0 refills | Status: DC | PRN

## 2019-08-19 ENCOUNTER — Ambulatory Visit: Payer: PPO | Admitting: Obstetrics and Gynecology

## 2019-08-19 ENCOUNTER — Other Ambulatory Visit: Payer: Self-pay

## 2019-08-19 ENCOUNTER — Encounter: Payer: Self-pay | Admitting: Obstetrics and Gynecology

## 2019-08-19 VITALS — BP 122/80 | HR 80 | Wt 206.0 lb

## 2019-08-19 DIAGNOSIS — Z9889 Other specified postprocedural states: Secondary | ICD-10-CM

## 2019-08-19 DIAGNOSIS — R102 Pelvic and perineal pain: Secondary | ICD-10-CM

## 2019-08-19 DIAGNOSIS — N840 Polyp of corpus uteri: Secondary | ICD-10-CM

## 2019-08-19 NOTE — Progress Notes (Signed)
RGYN patient presents for Post Op   Surgery on 08/05/19  CC: NONE

## 2019-08-19 NOTE — Progress Notes (Signed)
GYNECOLOGY OFFICE FOLLOW UP NOTE  History:  63 y.o. [id number] here today for follow up for D&C hysteroscopy, polypectomy done on 08/05/19 for post menopausal bleeding. She is doing well. Minimal pain after procedure that has now resolved. No bleeding. Appetite back to normal, no issues with bathroom use. Overall, feeling well.    Past Medical History:  Diagnosis Date  . Anxiety   . Asthma   . Depression   . Essential hypertension   . Fatty liver   . Gastritis   . Gastroparesis   . GERD (gastroesophageal reflux disease)   . Hiatal hernia   . History of cardiac catheterization    Minimal coronary atherosclerosis February 2016  . History of kidney stones   . History of stroke 2008  . HLD (hyperlipidemia)   . Neuropathy   . Obesity   . Pancreatitis   . Rheumatoid arthritis(714.0)   . Tibialis tendinitis   . Type 2 diabetes mellitus ([place])     Past Surgical History:  Procedure Laterality Date  . ANKLE SURGERY     remove extra bone-left  . CARPAL TUNNEL RELEASE     right side  . CARPAL TUNNEL RELEASE Left 02/06/2013   Procedure: CARPAL TUNNEL RELEASE ;  Surgeon: Carole Civil, MD;  Location: [facility];  Service: Orthopedics;  Laterality: Left;  procedure end 1135  . CARPAL TUNNEL RELEASE Right 09/14/2015   Procedure: RIGHT CARPAL TUNNEL RELEASE;  Surgeon: Carole Civil, MD;  Location: [facility];  Service: Orthopedics;  Laterality: Right;  . CARPAL TUNNEL RELEASE Left 09/28/2015   Procedure: LEFT CARPAL TUNNEL RELEASE;  Surgeon: Carole Civil, MD;  Location: [facility];  Service: Orthopedics;  Laterality: Left;  procedure 1  . CESAREAN SECTION    . CHOLECYSTECTOMY    . COLONOSCOPY WITH PROPOFOL  09/16/2012   [id number] sessile polyps ranging between 3-66mm in size were found in the sigmoid colon and rectum; polypectomy was performed/Mild diverticulosis was noted throughout the entire examined colon/Small internal hemorrhoids  . DILATATION & CURETTAGE/HYSTEROSCOPY WITH MYOSURE  N/A 08/05/2019   Procedure: DILATATION & CURETTAGE/HYSTEROSCOPY WITH MYOSURE;  Surgeon: Sloan Leiter, MD;  Location: [facility];  Service: Gynecology;  Laterality: N/A;  myosure rep will be here.  Confirmed on 07/30/19 CS  . DILATION AND CURETTAGE OF UTERUS     multiple  . ESOPHAGOGASTRODUODENOSCOPY  07/29/2009   Mild gastritis, benign path  . ESOPHAGOGASTRODUODENOSCOPY (EGD) WITH PROPOFOL  09/16/2012   SLF:Non-erosive gastritis (inflammation) was found; multiple bx/The duodenal mucosa showed no abnormalities in the ampulla and bulb and second portion of the duodenum/NAUSEA/VOMITING MOST LIKELY DUE TO GERD/GASTRITIS  . FINGER SURGERY Left    left little finger-otif of finger  . Gastric Emptying  07/27/2009   The amount of activity in the stomach at 120 minutes was 13% which is in the normal range  . LASER ABLATION OF THE CERVIX    . LEFT AND RIGHT HEART CATHETERIZATION WITH CORONARY ANGIOGRAM N/A 01/03/2015   Procedure: LEFT AND RIGHT HEART CATHETERIZATION WITH CORONARY ANGIOGRAM;  Surgeon: Blane Ohara, MD;  Location: [facility] CATH [facility];  Service: Cardiovascular;  Laterality: N/A;  . LITHOTRIPSY    . POLYPECTOMY  09/16/2012   Procedure: POLYPECTOMY;  Surgeon: Danie Binder, MD;  Location: [facility];  Service: Endoscopy;  Laterality: N/A;  . SAVORY DILATION  09/16/2012   Procedure: SAVORY DILATION;  Surgeon: Danie Binder, MD;  Location: [facility];  Service: Endoscopy;  Laterality: N/A;  16  fr dilation  . TRIGGER FINGER RELEASE Left 02/06/2013   Procedure: RELEASE TRIGGER FINGER LEFT LONG FINGER/A-1 PULLEY;  Surgeon: Carole Civil, MD;  Location: [facility];  Service: Orthopedics;  Laterality: Left;  procedure began 1136  . TRIGGER FINGER RELEASE Right 09/14/2015   Procedure: RIGHT LONG FINGER TRIGGER RELEASE;  Surgeon: Carole Civil, MD;  [facility]: [facility];  Service: Orthopedics;  Laterality: Right;  . TRIGGER FINGER RELEASE Left 09/28/2015   Procedure: LEFT RING TRIGGER FINGER  RELEASE;  Surgeon: Carole Civil, MD;  Location: [facility];  Service: Orthopedics;  Laterality: Left;  left ring finger  . TUBAL LIGATION       Current Outpatient Medications:  .  albuterol (VENTOLIN HFA) 108 (90 Base) MCG/ACT inhaler, INHALE 2 PUFFS INTO THE LUNGS EVERY 4 (FOUR) HOURS AS NEEDED FOR WHEEZING., Disp: 54 g, Rfl: 5 .  amLODipine (NORVASC) 10 MG tablet, Take 1 tablet (10 mg total) by mouth daily., Disp: 90 tablet, Rfl: 0 .  aspirin EC 81 MG tablet, Take 81 mg by mouth daily., Disp: , Rfl:  .  cetirizine (ZYRTEC) 10 MG tablet, Take 1 tablet (10 mg total) by mouth daily., Disp: 90 tablet, Rfl: 1 .  cyanocobalamin 1000 MCG tablet, Take 1,000 mcg by mouth daily. Reported on 02/02/2016, Disp: , Rfl:  .  Ergocalciferol (VITAMIN D2) 2000 UNITS TABS, Take by mouth daily., Disp: , Rfl:  .  fluticasone (FLONASE) 50 MCG/ACT nasal spray, Place 2 sprays into both nostrils daily., Disp: 48 g, Rfl: 3 .  Fluticasone-Salmeterol (ADVAIR DISKUS) 250-50 MCG/DOSE AEPB, INHALE 1 PUFF BY MOUTH 2 TIMES DAILY., Disp: 60 each, Rfl: 5 .  HYDROcodone-acetaminophen (NORCO) 7.5-325 MG tablet, Take 1 tablet by mouth every 6 (six) hours as needed for moderate pain., Disp: 120 tablet, Rfl: 0 .  HYDROcodone-acetaminophen (NORCO/VICODIN) 5-325 MG tablet, Take 1 tablet by mouth every 6 (six) hours as needed., Disp: 15 tablet, Rfl: 0 .  insulin detemir (LEVEMIR) 100 UNIT/ML injection, 70 units in the am and 60 units qhs 70 units in the am and 60 units qhs, Disp: 15 mL, Rfl: 1 .  lisinopril (ZESTRIL) 20 MG tablet, TAKE ONE AND ONE-HALF TABLET (30MG  TOTAL) BY MOUTH DAILY, Disp: 135 tablet, Rfl: 0 .  metFORMIN (GLUCOPHAGE) 1000 MG tablet, TAKE ONE TABLET (1000MG  TOTAL) BY MOUTH TWO TIMES DAILY, Disp: 180 tablet, Rfl: 0 .  Multiple Vitamin (MULTIVITAMIN WITH MINERALS) TABS, Take 1 tablet by mouth daily., Disp: , Rfl:  .  mupirocin ointment (BACTROBAN) 2 %, Apply 1 application topically 2 (two) times daily., Disp: 30 g,  Rfl: 0 .  NOVOLOG FLEXPEN 100 UNIT/ML FlexPen, Inject 22 Units into the skin 3 (three) times daily with meals., Disp: 15 mL, Rfl: 1 .  ondansetron (ZOFRAN) 8 MG tablet, TAKE ONE TABLET BY MOUTH EVERY 12 HOURS AS NEEDED FOR NAUSEA, Disp: 20 tablet, Rfl: 5 .  pantoprazole (PROTONIX) 40 MG tablet, TAKE ONE TABLET (40MG  TOTAL) BY MOUTH DAILY, Disp: 90 tablet, Rfl: 0 .  promethazine (PHENERGAN) 25 MG tablet, TAKE ONE TABLET BY MOUTH EVERY 8 HOURS AS NEEDED FOR NAUSEA, Disp: 30 tablet, Rfl: 5 .  pyridOXINE (VITAMIN B-6) 100 MG tablet, Take 100 mg by mouth daily. Reported on 02/02/2016, Disp: , Rfl:  .  rosuvastatin (CRESTOR) 10 MG tablet, Take 1 tablet (10 mg total) by mouth daily., Disp: 90 tablet, Rfl: 0 .  sitaGLIPtin (JANUVIA) 100 MG tablet, Take 1 tablet (100 mg total) by mouth daily., Disp: 90 tablet,  Rfl: 1 .  venlafaxine (EFFEXOR) 75 MG tablet, Take three tablets daily, Disp: 270 tablet, Rfl: 1  The following portions of the patient's history were reviewed and updated as appropriate: allergies, current medications, past family history, past medical history, past social history, past surgical history and problem list.   Review of Systems:  Pertinent items noted in HPI and remainder of comprehensive ROS otherwise negative.   Objective:  Physical Exam BP 122/80   Pulse 80   Wt 206 lb (93.4 kg)   BMI 37.68 kg/m  CONSTITUTIONAL: Well-developed, well-nourished female in no acute distress.  HENT:  Normocephalic, atraumatic. External right and left ear normal. Oropharynx is clear and moist EYES: Conjunctivae and EOM are normal. Pupils are equal, round, and reactive to light. No scleral icterus.  NECK: Normal range of motion, supple, no masses SKIN: Skin is warm and dry. No rash noted. Not diaphoretic. No erythema. No pallor. NEUROLOGIC: Alert and oriented to person, place, and time. Normal reflexes, muscle tone coordination. No cranial nerve deficit noted. PSYCHIATRIC: Normal mood and affect.  Normal behavior. Normal judgment and thought content. CARDIOVASCULAR: Normal heart rate noted RESPIRATORY: Effort normal, no problems with respiration noted ABDOMEN: Soft, no distention noted.  PELVIC: deferred MUSCULOSKELETAL: Normal range of motion. No edema noted.   Labs and Imaging No results found.  SURGICAL PATHOLOGY  CASE: [id number]  PATIENT: [facility]  Surgical Pathology Report      Clinical History: PMB, uterine mass (cm)      DIAGNOSIS:   A. ENDOMETRIUM AND POLYP , CURETTAGE:  - Endometrioid type polyp (s).  - There is no evidence of hyperplasia or malignancy.   Assessment & Plan:   1. Postoperative state Doing well, no issues  2. Endometrial polyp Benign No need for further follow up  3. Vaginal pain Okay for intercourse Cont to use lube F/u 3 months  Routine preventative health maintenance measures emphasized. Please refer to After Visit Summary for other counseling recommendations.   Return in about 3 months (around 11/18/2019) for Followup.  Total face-to-face time with patient: 15 minutes. Over 50% of encounter was spent on counseling and coordination of care.  Feliz Beam, M.D. Attending Center for [employer])

## 2019-08-22 ENCOUNTER — Encounter: Payer: Self-pay | Admitting: Family Medicine

## 2019-08-24 NOTE — Telephone Encounter (Signed)
It would be unusual for shingles to last for a month.  This raises into question the possibility that it could be something different causing this.  At the very least I would recommend a virtual visit for the patient or in person

## 2019-09-03 DIAGNOSIS — Z79899 Other long term (current) drug therapy: Secondary | ICD-10-CM | POA: Diagnosis not present

## 2019-09-03 DIAGNOSIS — E7849 Other hyperlipidemia: Secondary | ICD-10-CM | POA: Diagnosis not present

## 2019-09-03 DIAGNOSIS — E1159 Type 2 diabetes mellitus with other circulatory complications: Secondary | ICD-10-CM | POA: Diagnosis not present

## 2019-09-03 DIAGNOSIS — Z113 Encounter for screening for infections with a predominantly sexual mode of transmission: Secondary | ICD-10-CM | POA: Diagnosis not present

## 2019-09-04 LAB — BASIC METABOLIC PANEL
BUN/Creatinine Ratio: 15 (calc) (ref 6–22)
BUN: 18 mg/dL (ref 7–25)
CO2: 26 mmol/L (ref 20–32)
Calcium: 10 mg/dL (ref 8.6–10.4)
Chloride: 103 mmol/L (ref 98–110)
Creat: 1.19 mg/dL — ABNORMAL HIGH (ref 0.50–0.99)
Glucose, Bld: 179 mg/dL — ABNORMAL HIGH (ref 65–99)
Potassium: 4.3 mmol/L (ref 3.5–5.3)
Sodium: 140 mmol/L (ref 135–146)

## 2019-09-04 LAB — HEPATIC FUNCTION PANEL
AG Ratio: 1.5 (calc) (ref 1.0–2.5)
ALT: 20 U/L (ref 6–29)
AST: 28 U/L (ref 10–35)
Albumin: 4.5 g/dL (ref 3.6–5.1)
Alkaline phosphatase (APISO): 55 U/L (ref 37–153)
Bilirubin, Direct: 0.1 mg/dL (ref 0.0–0.2)
Globulin: 3.1 g/dL (calc) (ref 1.9–3.7)
Indirect Bilirubin: 0.4 mg/dL (calc) (ref 0.2–1.2)
Total Bilirubin: 0.5 mg/dL (ref 0.2–1.2)
Total Protein: 7.6 g/dL (ref 6.1–8.1)

## 2019-09-04 LAB — HEPATITIS C ANTIBODY
Hepatitis C Ab: NONREACTIVE
SIGNAL TO CUT-OFF: 0.04 (ref <1.00)

## 2019-09-04 LAB — LIPID PANEL
Cholesterol: 161 mg/dL (ref <200)
HDL: 50 mg/dL (ref >=50)
LDL Cholesterol (Calc): 88 mg/dL (calc)
Non-HDL Cholesterol (Calc): 111 mg/dL (calc) (ref <130)
Total CHOL/HDL Ratio: 3.2 (calc) (ref <5.0)
Triglycerides: 130 mg/dL (ref <150)

## 2019-09-04 LAB — MICROALBUMIN / CREATININE URINE RATIO
Creatinine, Urine: 394 mg/dL — ABNORMAL HIGH (ref 20–275)
Microalb Creat Ratio: 36 mcg/mg creat — ABNORMAL HIGH (ref <30)
Microalb, Ur: 14 mg/dL

## 2019-09-04 LAB — HIV ANTIBODY (ROUTINE TESTING W REFLEX): HIV 1&2 Ab, 4th Generation: NONREACTIVE

## 2019-09-07 ENCOUNTER — Other Ambulatory Visit: Payer: Self-pay

## 2019-09-07 ENCOUNTER — Encounter: Payer: Self-pay | Admitting: Family Medicine

## 2019-09-07 ENCOUNTER — Ambulatory Visit (INDEPENDENT_AMBULATORY_CARE_PROVIDER_SITE_OTHER): Payer: PPO | Admitting: Family Medicine

## 2019-09-07 VITALS — BP 112/68 | Temp 96.8°F | Wt 203.6 lb

## 2019-09-07 DIAGNOSIS — R21 Rash and other nonspecific skin eruption: Secondary | ICD-10-CM | POA: Diagnosis not present

## 2019-09-07 DIAGNOSIS — E1159 Type 2 diabetes mellitus with other circulatory complications: Secondary | ICD-10-CM | POA: Diagnosis not present

## 2019-09-07 DIAGNOSIS — E7849 Other hyperlipidemia: Secondary | ICD-10-CM | POA: Diagnosis not present

## 2019-09-07 DIAGNOSIS — R300 Dysuria: Secondary | ICD-10-CM

## 2019-09-07 DIAGNOSIS — Z23 Encounter for immunization: Secondary | ICD-10-CM

## 2019-09-07 DIAGNOSIS — I10 Essential (primary) hypertension: Secondary | ICD-10-CM | POA: Diagnosis not present

## 2019-09-07 LAB — POCT GLYCOSYLATED HEMOGLOBIN (HGB A1C): Hemoglobin A1C: 6.9 % — AB (ref 4.0–5.6)

## 2019-09-07 MED ORDER — HYDROCODONE-ACETAMINOPHEN 7.5-325 MG PO TABS: 1.0000 | ORAL_TABLET | Freq: Four times a day (QID) | ORAL | 0 refills | Status: DC | PRN

## 2019-09-07 MED ORDER — FLUCONAZOLE 150 MG PO TABS: 150.0000 mg | ORAL_TABLET | Freq: Once | ORAL | 0 refills | Status: AC

## 2019-09-07 MED ORDER — HYDROCODONE-ACETAMINOPHEN 7.5-325 MG PO TABS: ORAL_TABLET | ORAL | 0 refills | Status: DC

## 2019-09-07 MED ORDER — CEPHALEXIN 500 MG PO CAPS: ORAL_CAPSULE | ORAL | 0 refills | Status: DC

## 2019-09-07 MED ORDER — TRIAMCINOLONE ACETONIDE 0.1 % EX CREA: TOPICAL_CREAM | CUTANEOUS | 3 refills | Status: DC

## 2019-09-07 MED ORDER — ROSUVASTATIN CALCIUM 20 MG PO TABS: ORAL_TABLET | ORAL | 1 refills | Status: DC

## 2019-09-07 NOTE — Progress Notes (Signed)
Subjective:    Patient ID: Jamie Harding, female    DOB: October 07, 1956, 63 y.o.   MRN: [id number]  Diabetes She presents for her follow-up diabetic visit. She has type 2 diabetes mellitus. There are no hypoglycemic associated symptoms. Pertinent negatives for hypoglycemia include no confusion or dizziness. There are no diabetic associated symptoms. Pertinent negatives for diabetes include no chest pain, no fatigue, no polydipsia, no polyphagia and no weakness. There are no hypoglycemic complications. There are no diabetic complications. Compliance with diabetes treatment: Novolog 22 units TID; Metformin 1000 mg BID; Januvia 100 mg daily  She does not see a podiatrist.Eye exam is current.   This patient was seen today for chronic pain  The medication list was reviewed and updated.   -Compliance with medication: Hydrocodone 7.5-325  - Number patient states they take daily: 3-4  -when was the last dose patient took? This morning  The patient was advised the importance of maintaining medication and not using illegal substances with these.  Here for refills and follow up  The patient was educated that we can provide 3 monthly scripts for their medication, it is their responsibility to follow the instructions.  Side effects or complications from medications: none  Patient is aware that pain medications are meant to minimize the severity of the pain to allow their pain levels to improve to allow for better function. They are aware of that pain medications cannot totally remove their pain.  Due for UDT ( at least once per year) : UDT up to date  Pt also having burning with urination; flank pain on left side; frequency but then when she goes, she does not go a lot. Pt states that she gets UTIs frequently. Has been using Azo and that has helped.    I did review the labs with the patient today we talked about slight amount of protein in the urine the importance of a healthy diet  Patient saw  eye specialist they told her that she had some retinal changes we talked about the importance to get this under control  Patient for blood pressure check up.  The patient does have hypertension.  The patient is on medication.  Patient relates compliance with meds. Todays BP reviewed with the patient. Patient denies issues with medication. Patient relates reasonable diet. Patient tries to minimize salt. Patient aware of BP goals.  Patient here for follow-up regarding cholesterol.  The patient does have hyperlipidemia.  Patient does try to maintain a reasonable diet.  Patient does take the medication on a regular basis.  Denies missing a dose.  The patient denies any obvious side effects.  Prior blood work results reviewed with the patient.  The patient is aware of his cholesterol goals and the need to keep it under good control to lessen the risk of disease.    Review of Systems  Constitutional: Negative for activity change, appetite change and fatigue.  HENT: Negative for congestion and rhinorrhea.   Respiratory: Negative for cough and shortness of breath.   Cardiovascular: Negative for chest pain and leg swelling.  Gastrointestinal: Negative for abdominal pain and diarrhea.  Endocrine: Negative for polydipsia and polyphagia.  Skin: Negative for color change.  Neurological: Negative for dizziness and weakness.  Psychiatric/Behavioral: Negative for behavioral problems and confusion.       Objective:   Physical Exam Vitals signs reviewed.  Constitutional:      General: She is not in acute distress. HENT:     Head: Normocephalic and  atraumatic.  Eyes:     General:        Right eye: No discharge.        Left eye: No discharge.  Neck:     Trachea: No tracheal deviation.  Cardiovascular:     Rate and Rhythm: Normal rate and regular rhythm.     Heart sounds: Normal heart sounds. No murmur.  Pulmonary:     Effort: Pulmonary effort is normal. No respiratory distress.     Breath sounds:  Normal breath sounds.  Lymphadenopathy:     Cervical: No cervical adenopathy.  Skin:    General: Skin is warm and dry.  Neurological:     Mental Status: She is alert.     Coordination: Coordination normal.  Psychiatric:        Behavior: Behavior normal.     Results for orders placed or performed in visit on 09/07/19  POCT HgB A1C  Result Value Ref Range   Hemoglobin A1C 6.9 (A) 4.0 - 5.6 %   HbA1c POC (<> result, manual entry)     HbA1c, POC (prediabetic range)     HbA1c, POC (controlled diabetic range)       25 minutes was spent with the patient.  This statement verifies that 25 minutes was indeed spent with the patient.  More than 50% of this visit-total duration of the visit-was spent in counseling and coordination of care. The issues that the patient came in for today as reflected in the diagnosis (s) please refer to documentation for further details.     Assessment & Plan:  1. Type 2 diabetes mellitus with vascular disease ([place]) Overall diabetes is doing better very important for the patient to get this under good control to keep it under control - POCT HgB A1C  2. Dysuria UTI go ahead with antibiotic Keflex for the next 7 days culture urine - POCT Urinalysis Dipstick - Urine Culture  3. Essential hypertension Blood pressure good control continue current measures  4. Other hyperlipidemia Hyperlipidemia continue current measures check labs on a regular basis  5. Rash Rash on the chest looks like folliculitis go ahead with antibiotics.  6. Need for vaccination Flu shot today - Flu Vaccine QUAD 6+ mos PF IM (Fluarix Quad PF)  The patient was seen in followup for chronic pain. A review over at their current pain status was discussed. Drug registry was checked. Prescriptions were given. Discussion was held regarding the importance of compliance with medication as well as pain medication contract.  Time for questions regarding pain management plan occurred.  Importance of regular followup visits was discussed. Patient was informed that medication may cause drowsiness and should not be combined  with other medications/alcohol or street drugs. Patient was cautioned that medication could cause drowsiness. If the patient feels medication is causing altered alertness then do not drive or operate dangerous equipment.

## 2019-09-10 LAB — POCT URINALYSIS DIPSTICK: Spec Grav, UA: 1.01 (ref 1.010–1.025)

## 2019-09-10 LAB — URINE CULTURE

## 2019-09-10 NOTE — Progress Notes (Signed)
I forgot to result her dipstick for her visit. I have looked the shred boxes but unable to find the sticky note or paper that had results on it. Please advise. Thank you

## 2019-09-10 NOTE — Progress Notes (Signed)
Dipstick has been resulted. Please advise. Thank you

## 2019-09-14 ENCOUNTER — Other Ambulatory Visit: Payer: Self-pay | Admitting: Family Medicine

## 2019-09-14 ENCOUNTER — Encounter: Payer: Self-pay | Admitting: Family Medicine

## 2019-09-14 MED ORDER — HYDROCODONE-ACETAMINOPHEN 7.5-325 MG PO TABS: ORAL_TABLET | ORAL | 0 refills | Status: DC

## 2019-09-14 NOTE — Telephone Encounter (Signed)
Pharmacist at [facility] stated they are not taking any new pain med prescriptions because they have a limited supply they are using to fill their current customers. Patient advised to check with other pharmacies in the area to see who can fill the prescription and will let us know

## 2019-09-28 ENCOUNTER — Encounter: Payer: Self-pay | Admitting: Family Medicine

## 2019-09-29 ENCOUNTER — Other Ambulatory Visit: Payer: Self-pay | Admitting: *Deleted

## 2019-09-29 MED ORDER — CEPHALEXIN 500 MG PO CAPS: ORAL_CAPSULE | ORAL | 0 refills | Status: DC

## 2019-09-29 NOTE — Telephone Encounter (Signed)
Nurses Repeat Keflex 500 mg, take 1 tablet 4 times daily for the next 7 days Touch base with patient if she is interested in doing consultation with urology please do so Patient can do follow-up with [name] if she prefers

## 2019-09-30 ENCOUNTER — Other Ambulatory Visit: Payer: Self-pay | Admitting: *Deleted

## 2019-09-30 DIAGNOSIS — N390 Urinary tract infection, site not specified: Secondary | ICD-10-CM

## 2019-10-06 ENCOUNTER — Encounter: Payer: Self-pay | Admitting: Family Medicine

## 2019-10-19 ENCOUNTER — Other Ambulatory Visit: Payer: Self-pay | Admitting: Family Medicine

## 2019-10-30 ENCOUNTER — Encounter: Payer: Self-pay | Admitting: Family Medicine

## 2019-10-30 ENCOUNTER — Telehealth: Payer: Self-pay | Admitting: *Deleted

## 2019-10-30 ENCOUNTER — Other Ambulatory Visit: Payer: Self-pay | Admitting: *Deleted

## 2019-10-30 MED ORDER — NITROFURANTOIN MONOHYD MACRO 100 MG PO CAPS: 100.0000 mg | ORAL_CAPSULE | Freq: Two times a day (BID) | ORAL | 0 refills | Status: DC

## 2019-10-30 NOTE — Telephone Encounter (Signed)
Med sent to pharm. Pt notified.  

## 2019-10-30 NOTE — Telephone Encounter (Signed)
Macrobid 100 bid 7 d 

## 2019-10-30 NOTE — Telephone Encounter (Signed)
Pt sent mychart message that she needed another antibiotic for uti. Started having burning with urination and urgency. No fever. Sees urology on jan 8th.  [facility].

## 2019-11-26 ENCOUNTER — Encounter: Payer: Self-pay | Admitting: Family Medicine

## 2019-11-27 ENCOUNTER — Ambulatory Visit: Payer: PPO | Admitting: Urology

## 2019-12-04 ENCOUNTER — Other Ambulatory Visit: Payer: Self-pay | Admitting: Family Medicine

## 2019-12-07 ENCOUNTER — Encounter: Payer: Self-pay | Admitting: Family Medicine

## 2019-12-08 ENCOUNTER — Other Ambulatory Visit: Payer: Self-pay

## 2019-12-08 ENCOUNTER — Ambulatory Visit (INDEPENDENT_AMBULATORY_CARE_PROVIDER_SITE_OTHER): Payer: PPO | Admitting: Family Medicine

## 2019-12-08 DIAGNOSIS — E1159 Type 2 diabetes mellitus with other circulatory complications: Secondary | ICD-10-CM

## 2019-12-08 DIAGNOSIS — I10 Essential (primary) hypertension: Secondary | ICD-10-CM

## 2019-12-08 DIAGNOSIS — E1169 Type 2 diabetes mellitus with other specified complication: Secondary | ICD-10-CM | POA: Diagnosis not present

## 2019-12-08 DIAGNOSIS — K760 Fatty (change of) liver, not elsewhere classified: Secondary | ICD-10-CM | POA: Diagnosis not present

## 2019-12-08 DIAGNOSIS — G894 Chronic pain syndrome: Secondary | ICD-10-CM | POA: Diagnosis not present

## 2019-12-08 DIAGNOSIS — E785 Hyperlipidemia, unspecified: Secondary | ICD-10-CM | POA: Diagnosis not present

## 2019-12-08 MED ORDER — METFORMIN HCL 1000 MG PO TABS: ORAL_TABLET | ORAL | 1 refills | Status: DC

## 2019-12-08 MED ORDER — HYDROCODONE-ACETAMINOPHEN 7.5-325 MG PO TABS: 1.0000 | ORAL_TABLET | Freq: Four times a day (QID) | ORAL | 0 refills | Status: DC | PRN

## 2019-12-08 MED ORDER — HYDROCODONE-ACETAMINOPHEN 7.5-325 MG PO TABS: ORAL_TABLET | ORAL | 0 refills | Status: DC

## 2019-12-08 MED ORDER — PANTOPRAZOLE SODIUM 40 MG PO TBEC: DELAYED_RELEASE_TABLET | ORAL | 1 refills | Status: DC

## 2019-12-08 MED ORDER — SITAGLIPTIN PHOSPHATE 100 MG PO TABS: 100.0000 mg | ORAL_TABLET | Freq: Every day | ORAL | 1 refills | Status: DC

## 2019-12-08 MED ORDER — NOVOLOG FLEXPEN 100 UNIT/ML ~~LOC~~ SOPN: 22.0000 [IU] | PEN_INJECTOR | Freq: Three times a day (TID) | SUBCUTANEOUS | 1 refills | Status: DC

## 2019-12-08 MED ORDER — DOXYCYCLINE HYCLATE 100 MG PO TABS: 100.0000 mg | ORAL_TABLET | Freq: Two times a day (BID) | ORAL | 0 refills | Status: DC

## 2019-12-08 MED ORDER — ROSUVASTATIN CALCIUM 20 MG PO TABS: ORAL_TABLET | ORAL | 1 refills | Status: DC

## 2019-12-08 MED ORDER — INSULIN DETEMIR 100 UNIT/ML ~~LOC~~ SOLN: SUBCUTANEOUS | 5 refills | Status: DC

## 2019-12-08 MED ORDER — LISINOPRIL 20 MG PO TABS: ORAL_TABLET | ORAL | 1 refills | Status: DC

## 2019-12-08 MED ORDER — AMLODIPINE BESYLATE 10 MG PO TABS: 10.0000 mg | ORAL_TABLET | Freq: Every day | ORAL | 1 refills | Status: DC

## 2019-12-08 MED ORDER — VENLAFAXINE HCL 75 MG PO TABS: ORAL_TABLET | ORAL | 1 refills | Status: DC

## 2019-12-08 NOTE — Progress Notes (Signed)
Subjective:    Patient ID: Jamie Harding, female    DOB: July 24, 1956, 64 y.o.   MRN: [id number] Audiovisual visit HPI This patient was seen today for chronic pain. Takes for back pain, leg pain and foot pain.  States it does help her with her back pain or leg pain denies you abusing it.  Allows her to function better without it she would have a hard time functioning denies being drugged by it. The medication list was reviewed and updated.   -Compliance with medication: yes  - Number patient states they take daily: takes 4 a day  -when was the last dose patient took? 4 a day  The patient was advised the importance of maintaining medication and not using illegal substances with these.  Here for refills and follow up  The patient was educated that we can provide 3 monthly scripts for their medication, it is their responsibility to follow the instructions.  Side effects or complications from medications: none  Patient is aware that pain medications are meant to minimize the severity of the pain to allow their pain levels to improve to allow for better function. They are aware of that pain medications cannot totally remove their pain.  Due for UDT ( at least once per year) : last one 12/19/18.  Type 2 diabetes mellitus with vascular disease ([place]) - Plan: Hemoglobin A1c  Essential hypertension - Plan: Basic metabolic panel  Fatty liver - Plan: Hepatic function panel  Hyperlipidemia associated with type 2 diabetes mellitus ([place]) - Plan: Lipid panel  Chronic pain syndrome She relates her blood pressure doing well takes her medicine regular basis.  Tries to minimize salt in the diet She does have fatty liver along with morbid obesity she does try to eat best she can try to watch her diet trying to stay active and lose weight States her diabetes been doing good she has not had to take as much insulin states her she thinks her sugars are doing well when she checks some She does have  hyperlipidemia with associated with diabetes and does well with take her medicine.  Virtual Visit via Telephone Note  I connected with Jamie Harding on [id number] at  2:00 PM EST by telephone and verified that I am speaking with the correct person using two identifiers.  Location: Patient: home Provider: office   I discussed the limitations, risks, security and privacy concerns of performing an evaluation and management service by telephone and the availability of in person appointments. I also discussed with the patient that there may be a patient responsible charge related to this service. The patient expressed understanding and agreed to proceed.   History of Present Illness:    Observations/Objective:   Assessment and Plan:   Follow Up Instructions:    I discussed the assessment and treatment plan with the patient. The patient was provided an opportunity to ask questions and all were answered. The patient agreed with the plan and demonstrated an understanding of the instructions.   The patient was advised to call back or seek an in-person evaluation if the symptoms worsen or if the condition fails to improve as anticipated.  I provided 30 minutes of non-face-to-face time during this encounter.        Review of Systems  Constitutional: Negative for activity change, appetite change and fatigue.  HENT: Negative for congestion and rhinorrhea.   Respiratory: Negative for cough and shortness of breath.   Cardiovascular: Negative for chest pain and leg  swelling.  Gastrointestinal: Negative for abdominal pain and diarrhea.  Endocrine: Negative for polydipsia and polyphagia.  Musculoskeletal: Positive for arthralgias and back pain.  Skin: Negative for color change.  Neurological: Negative for dizziness and weakness.  Psychiatric/Behavioral: Negative for behavioral problems and confusion.       Objective:   Physical Exam   Virtual visit unable to do physical exam       Assessment & Plan:  1. Type 2 diabetes mellitus with vascular disease ([place]) Diabetes reportedly good control we will check A1c to see how its doing continue current medications. - Hemoglobin A1c  2. Essential hypertension Blood pressure reportedly good continue current medicine minimize salt in the diet stay physically active - Basic metabolic panel  3. Fatty liver Fatty liver along with morbid obesity very important for the patient tried exercise lose weight try to bring weight down to help her liver stay healthy - Hepatic function panel  4. Hyperlipidemia associated with type 2 diabetes mellitus ([place]) Hyperlipidemia continue medication check lab work await results continue to watch diet closely - Lipid panel  5. Chronic pain syndrome Chronic pain overall does well with the medicine keeps the pain under control denies abusing the medicine.The patient was seen in followup for chronic pain. A review over at their current pain status was discussed. Drug registry was checked. Prescriptions were given. Discussion was held regarding the importance of compliance with medication as well as pain medication contract.  Time for questions regarding pain management plan occurred. Importance of regular followup visits was discussed. Patient was informed that medication may cause drowsiness and should not be combined  with other medications/alcohol or street drugs. Patient was cautioned that medication could cause drowsiness. If the patient feels medication is causing altered alertness then do not drive or operate dangerous equipment.  Follow-up in 3 months sooner problems

## 2019-12-08 NOTE — Telephone Encounter (Signed)
Nurses  I recommend doxycycline 100 mg 1 twice daily for the next 10 days Keep follow-up visit Tuesday afternoon we can discuss further at that time Take medication with a snack and a glass of water

## 2019-12-08 NOTE — Telephone Encounter (Signed)
Discussed with pt and med sent to pharm.  

## 2019-12-11 ENCOUNTER — Encounter: Payer: Self-pay | Admitting: Urology

## 2019-12-11 ENCOUNTER — Other Ambulatory Visit: Payer: Self-pay

## 2019-12-11 ENCOUNTER — Ambulatory Visit (INDEPENDENT_AMBULATORY_CARE_PROVIDER_SITE_OTHER): Payer: PPO | Admitting: Urology

## 2019-12-11 VITALS — BP 133/74 | HR 94 | Temp 97.7°F | Ht 63.0 in | Wt 196.0 lb

## 2019-12-11 DIAGNOSIS — N200 Calculus of kidney: Secondary | ICD-10-CM

## 2019-12-11 DIAGNOSIS — N952 Postmenopausal atrophic vaginitis: Secondary | ICD-10-CM

## 2019-12-11 DIAGNOSIS — N390 Urinary tract infection, site not specified: Secondary | ICD-10-CM

## 2019-12-11 DIAGNOSIS — N3946 Mixed incontinence: Secondary | ICD-10-CM | POA: Diagnosis not present

## 2019-12-11 LAB — POCT URINALYSIS DIPSTICK
Blood, UA: NEGATIVE
Glucose, UA: NEGATIVE
Ketones, UA: NEGATIVE
Leukocytes, UA: NEGATIVE
Nitrite, UA: NEGATIVE
Protein, UA: NEGATIVE
Spec Grav, UA: >=1.03 — AB (ref 1.010–1.025)
Urobilinogen, UA: NEGATIVE E.U./dL — AB
pH, UA: 5 (ref 5.0–8.0)

## 2019-12-11 LAB — BLADDER SCAN AMB NON-IMAGING: Scan Result: 31.5

## 2019-12-11 MED ORDER — ESTRADIOL 0.1 MG/GM VA CREA: 0.2500 | TOPICAL_CREAM | Freq: Every day | VAGINAL | 12 refills | Status: DC

## 2019-12-11 NOTE — Progress Notes (Signed)
Subjective: 1. Chronic UTI   2. Nephrolithiasis   3. Vaginal atrophy   4. Mixed incontinence urge and stress      Jamie Harding is a former patient who I last saw in 2015.  She is sent back in consultation by Dr. [name] for recurrent UTI's.  She has had a 6-12 month history of recurrent UTI's which are happening about 1 x monthly.  The symptoms are dysuria, frequency with small dribbling voids and occasional hematuria.  She has some intermittency and doesn't always feel she empties.   She has no flank pain but some back pain.   She has lower abdominal pain.  She has had no nausea or vomiting and has had no fever.  She has some hesitancy but can also have urgency with UUI.  She has some SUI.  She wears pads when she goes out.  She has had severe kidney stones but no recent imaging.   She has had endoscopic management in the past.   She has no other associated signs or symptoms.  The last culture and the only one from the last year was positive for Klebsiella on 09/07/19.  Her last Cr was 1.19 on 09/03/19.   She is a diabetic and her last A1c was 6.9 on 09/03/19. She has diabetic neuropathy.  I reviewed the report from her last CT in 2013 and renal stones were noted.  She is on doxycycline current for a diabetic skin ulcer on her abdomen.   She is not currently symptomatic for a UTI.  ROS:  Review of Systems  Constitutional: Positive for malaise/fatigue.       Night sweats.  HENT:       Sinus congestion  Eyes: Positive for blurred vision.  Gastrointestinal: Positive for heartburn, nausea and vomiting.  Genitourinary: Positive for dysuria, frequency and urgency.  Musculoskeletal: Positive for back pain and joint pain.  Skin: Positive for rash.  Neurological: Positive for dizziness, tingling (in feet) and headaches.  Psychiatric/Behavioral: Positive for depression. The patient is nervous/anxious.     Allergies  Allergen Reactions  . Byetta 10 Mcg Pen [Exenatide] Diarrhea and Nausea And Vomiting       touch of pancreatis  . Naproxen Nausea And Vomiting  . Augmentin [Amoxicillin-Pot Clavulanate]     diarrhea  . Azithromycin Nausea And Vomiting and Rash  . Erythromycin Hives       . Invokana [Canagliflozin]     Urinary issues,yeast infection  . Morphine Hives and Rash  . Sulfonamide Derivatives Nausea And Vomiting and Rash    Past Medical History:  Diagnosis Date  . Anxiety   . Asthma   . Depression   . Essential hypertension   . Fatty liver   . Gastritis   . Gastroparesis   . GERD (gastroesophageal reflux disease)   . Hiatal hernia   . History of cardiac catheterization    Minimal coronary atherosclerosis February 2016  . History of kidney stones   . History of stroke 2008  . HLD (hyperlipidemia)   . Neuropathy   . Obesity   . Pancreatitis   . Rheumatoid arthritis(714.0)   . Tibialis tendinitis   . Type 2 diabetes mellitus ([place])     Past Surgical History:  Procedure Laterality Date  . ANKLE SURGERY     remove extra bone-left  . CARPAL TUNNEL RELEASE     right side  . CARPAL TUNNEL RELEASE Left 02/06/2013   Procedure: CARPAL TUNNEL RELEASE ;  Surgeon: Carole Civil,  MD;  Location: [facility];  Service: Orthopedics;  Laterality: Left;  procedure end 1135  . CARPAL TUNNEL RELEASE Right 09/14/2015   Procedure: RIGHT CARPAL TUNNEL RELEASE;  Surgeon: Carole Civil, MD;  Location: [facility];  Service: Orthopedics;  Laterality: Right;  . CARPAL TUNNEL RELEASE Left 09/28/2015   Procedure: LEFT CARPAL TUNNEL RELEASE;  Surgeon: Carole Civil, MD;  Location: [facility];  Service: Orthopedics;  Laterality: Left;  procedure 1  . CESAREAN SECTION    . CHOLECYSTECTOMY    . COLONOSCOPY WITH PROPOFOL  09/16/2012   [id number] sessile polyps ranging between 3-100mm in size were found in the sigmoid colon and rectum; polypectomy was performed/Mild diverticulosis was noted throughout the entire examined colon/Small internal hemorrhoids  . DILATATION & CURETTAGE/HYSTEROSCOPY WITH  MYOSURE N/A 08/05/2019   Procedure: DILATATION & CURETTAGE/HYSTEROSCOPY WITH MYOSURE;  Surgeon: Sloan Leiter, MD;  Location: [facility];  Service: Gynecology;  Laterality: N/A;  myosure rep will be here.  Confirmed on 07/30/19 CS  . DILATION AND CURETTAGE OF UTERUS     multiple  . ESOPHAGOGASTRODUODENOSCOPY  07/29/2009   Mild gastritis, benign path  . ESOPHAGOGASTRODUODENOSCOPY (EGD) WITH PROPOFOL  09/16/2012   SLF:Non-erosive gastritis (inflammation) was found; multiple bx/The duodenal mucosa showed no abnormalities in the ampulla and bulb and second portion of the duodenum/NAUSEA/VOMITING MOST LIKELY DUE TO GERD/GASTRITIS  . FINGER SURGERY Left    left little finger-otif of finger  . Gastric Emptying  07/27/2009   The amount of activity in the stomach at 120 minutes was 13% which is in the normal range  . LASER ABLATION OF THE CERVIX    . LEFT AND RIGHT HEART CATHETERIZATION WITH CORONARY ANGIOGRAM N/A 01/03/2015   Procedure: LEFT AND RIGHT HEART CATHETERIZATION WITH CORONARY ANGIOGRAM;  Surgeon: Blane Ohara, MD;  Location[facility];  Service: Cardiovascular;  Laterality: N/A;  . LITHOTRIPSY    . POLYPECTOMY  09/16/2012   Procedure: POLYPECTOMY;  Surgeon: Danie Binder, MD;  Location: [facility];  Service: Endoscopy;  Laterality: N/A;  . SAVORY DILATION  09/16/2012   Procedure: SAVORY DILATION;  Surgeon: Danie Binder, MD;  Location: [facility];  Service: Endoscopy;  Laterality: N/A;  16 fr dilation  . TRIGGER FINGER RELEASE Left 02/06/2013   Procedure: RELEASE TRIGGER FINGER LEFT LONG FINGER/A-1 PULLEY;  Surgeon: Carole Civil, MD;  Location: [facility];  Service: Orthopedics;  Laterality: Left;  procedure began 1136  . TRIGGER FINGER RELEASE Right 09/14/2015   Procedure: RIGHT LONG FINGER TRIGGER RELEASE;  Surgeon: Carole Civil, MD;  Location: [facility];  Service: Orthopedics;  Laterality: Right;  . TRIGGER FINGER RELEASE Left 09/28/2015   Procedure: LEFT RING TRIGGER  FINGER RELEASE;  Surgeon: Carole Civil, MD;  Location: [facility];  Service: Orthopedics;  Laterality: Left;  left ring finger  . TUBAL LIGATION      Social History   Socioeconomic History  . Marital status: Married    Spouse name: Not on file  . Number of children: Not on file  . Years of education: Not on file  . Highest education level: Not on file  Occupational History  . Occupation: Chartered certified accountant    Comment: [employer], 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)    Employer: [employer]  Tobacco Use  . Smoking status: Former Smoker    Packs/day: 0.50    Years: 3.00    Pack years: 1.50    Types: Cigarettes    Quit date:  09/11/1987    Years since quitting: 32.2  . Smokeless tobacco: Never Used  Substance and Sexual Activity  . Alcohol use: No  . Drug use: No  . Sexual activity: Not Currently    Birth control/protection: Surgical  Other Topics Concern  . Not on file  Social History Narrative  . Not on file   Social Determinants of Health   Financial Resource Strain:   . Difficulty of Paying Living Expenses: Not on file  Food Insecurity:   . Worried About Charity fundraiser in the Last Year: Not on file  . Ran Out of Food in the Last Year: Not on file  Transportation Needs:   . Lack of Transportation (Medical): Not on file  . Lack of Transportation (Non-Medical): Not on file  Physical Activity:   . Days of Exercise per Week: Not on file  . Minutes of Exercise per Session: Not on file  Stress:   . Feeling of Stress : Not on file  Social Connections:   . Frequency of Communication with Friends and Family: Not on file  . Frequency of Social Gatherings with Friends and Family: Not on file  . Attends Religious Services: Not on file  . Active Member of Clubs or Organizations: Not on file  . Attends Archivist Meetings: Not on file  . Marital Status: Not on file  Intimate Partner Violence:   . Fear of Current or Ex-Partner: Not on file  . Emotionally Abused: Not  on file  . Physically Abused: Not on file  . Sexually Abused: Not on file    Family History  Problem Relation Age of Onset  . Breast cancer Mother   . Diabetes Father   . Coronary artery disease Other   . Arthritis Other   . Asthma Other   . Diabetes Sister   . Colon cancer Neg Hx     Anti-infectives: Anti-infectives (From admission, onward)   None      Current Outpatient Medications  Medication Sig Dispense Refill  . albuterol (VENTOLIN HFA) 108 (90 Base) MCG/ACT inhaler INHALE 2 PUFFS INTO THE LUNGS EVERY 4 (FOUR) HOURS AS NEEDED FOR WHEEZING. 54 g 5  . amLODipine (NORVASC) 10 MG tablet Take 1 tablet (10 mg total) by mouth daily. 90 tablet 1  . aspirin EC 81 MG tablet Take 81 mg by mouth daily.    . cetirizine (ZYRTEC) 10 MG tablet Take 1 tablet (10 mg total) by mouth daily. 90 tablet 1  . cyanocobalamin 1000 MCG tablet Take 1,000 mcg by mouth daily. Reported on 02/02/2016    . doxycycline (VIBRA-TABS) 100 MG tablet Take 1 tablet (100 mg total) by mouth 2 (two) times daily. 20 tablet 0  . Ergocalciferol (VITAMIN D2) 2000 UNITS TABS Take by mouth daily.    . fluticasone (FLONASE) 50 MCG/ACT nasal spray Place 2 sprays into both nostrils daily. 48 g 3  . HYDROcodone-acetaminophen (NORCO) 7.5-325 MG tablet Take 1 tablet by mouth every 6 (six) hours as needed for moderate pain. 120 tablet 0  . insulin detemir (LEVEMIR) 100 UNIT/ML injection 70 units in the am and 60 units qhs 70 units in the am and 60 units qhs 15 mL 5  . lisinopril (ZESTRIL) 20 MG tablet TAKE ONE AND ONE-HALF TABLET (30MG  TOTAL) BY MOUTH DAILY 135 tablet 1  . metFORMIN (GLUCOPHAGE) 1000 MG tablet TAKE 1 TABLET (1000 MG TOTAL) BY MOUTH 2TIMES A DAY. 180 tablet 1  . Multiple Vitamin (MULTIVITAMIN WITH MINERALS)  TABS Take 1 tablet by mouth daily.    . mupirocin ointment (BACTROBAN) 2 % Apply 1 application topically 2 (two) times daily. 30 g 0  . NOVOLOG FLEXPEN 100 UNIT/ML FlexPen Inject 22 Units into the skin 3  (three) times daily with meals. 15 mL 1  . ondansetron (ZOFRAN) 8 MG tablet TAKE ONE TABLET BY MOUTH EVERY 12 HOURS AS NEEDED FOR NAUSEA 20 tablet 5  . pantoprazole (PROTONIX) 40 MG tablet TAKE 1 TABLET (40 MG TOTAL) BY MOUTH DAILY. 90 tablet 1  . promethazine (PHENERGAN) 25 MG tablet TAKE ONE TABLET BY MOUTH EVERY EIGHT HOURS AS NEEDED FOR NAUSEA 30 tablet 5  . pyridOXINE (VITAMIN B-6) 100 MG tablet Take 100 mg by mouth daily. Reported on 02/02/2016    . sitaGLIPtin (JANUVIA) 100 MG tablet Take 1 tablet (100 mg total) by mouth daily. 90 tablet 1  . triamcinolone cream (KENALOG) 0.1 % Apply BID prn 45 g 3  . venlafaxine (EFFEXOR) 75 MG tablet Take three tablets daily 270 tablet 1  . estradiol (ESTRACE) 0.1 MG/GM vaginal cream Place [id number] Applicatorfuls vaginally at bedtime. Start with a nightly application and you can use a pea sized amount on the tip of the finger instead of the applicator.  After 2 weeks you can reduce to 2-3 x weekly. 42.5 g 12  . HYDROcodone-acetaminophen (NORCO) 7.5-325 MG tablet Take one tablet po every 6 hrs prn moderate pain 120 tablet 0  . HYDROcodone-acetaminophen (NORCO) 7.5-325 MG tablet Take one tablet po every 6 hrs prn moderate pain 120 tablet 0  . rosuvastatin (CRESTOR) 20 MG tablet Take one tablet po daily 90 tablet 1   No current facility-administered medications for this visit.     Objective: BP 133/74   Pulse 94   Temp 97.7 F (36.5 C)   Ht 5\' 3"  (1.6 m)   Wt 196 lb (88.9 kg)   BMI 34.72 kg/m     Physical Exam Vitals reviewed.  Constitutional:      Appearance: She is obese.  Cardiovascular:     Rate and Rhythm: Normal rate and regular rhythm.     Heart sounds: Normal heart sounds.  Pulmonary:     Effort: Pulmonary effort is normal. No respiratory distress.     Breath sounds: Normal breath sounds.  Abdominal:     Palpations: Abdomen is soft.     Tenderness: There is abdominal tenderness (mild diffuse). There is no left CVA tenderness.      Hernia: No hernia is present.  Genitourinary:    Comments: Nl external genitalia. Moderate introital stenosis. Severe atrophy. No significant prolapse or pelvic mass. Bladder non-tender.  Musculoskeletal:        General: No swelling or tenderness. Normal range of motion.  Skin:    General: Skin is warm and dry.  Neurological:     General: No focal deficit present.     Mental Status: She is alert and oriented to person, place, and time.  Psychiatric:        Mood and Affect: Mood normal.        Behavior: Behavior normal.     Lab Results:  Results for orders placed or performed in visit on 12/11/19 (from the past 24 hour(s))  POCT urinalysis dipstick     Status: Abnormal   Collection Time: 12/11/19  2:03 PM  Result Value Ref Range   Color, UA yellow    Clarity, UA clear    Glucose, UA Negative Negative   Bilirubin,  UA ++    Ketones, UA neg    Spec Grav, UA >=1.030 (A) 1.010 - 1.025   Blood, UA neg    pH, UA 5.0 5.0 - 8.0   Protein, UA Negative Negative   Urobilinogen, UA negative (A) 0.2 or 1.0 E.U./dL   Nitrite, UA neg    Leukocytes, UA Negative Negative   Appearance clear    Odor      Pertinent labs, imaging and notes reviewed with comments above.    PVR is 55ml.   Assessment/Plan: Recurrent UTI's.  Her urine is currently clear but she is on doxycycline.  She will need f/u when off of the antibiotics.  Atrophic vaginitis.   I will initiate topical estrogen therapy.  Nephrolithiasis.   She needs upper tract imaging with CT and may need cystoscopy.  Mixed incontinence.   No evaluation or treatment at this time.   Meds ordered this encounter  Medications  . estradiol (ESTRACE) 0.1 MG/GM vaginal cream    Sig: Place [id number] Applicatorfuls vaginally at bedtime. Start with a nightly application and you can use a pea sized amount on the tip of the finger instead of the applicator.  After 2 weeks you can reduce to 2-3 x weekly.    Dispense:  42.5 g    Refill:  12      Orders Placed This Encounter  Procedures  . CT RENAL STONE STUDY    Prior history of stones seen on CT in 2013.  Recurrent UTI's.    Standing Status:   Future    Standing Expiration Date:   01/11/2020    Order Specific Question:   Preferred imaging location?    Answer:   [facility]    Order Specific Question:   Radiology Contrast Protocol - do NOT remove file path    Answer:   [email or web address]  . POCT urinalysis dipstick  . Bladder Scan (Post Void Residual) in office     Return in about 6 weeks (around 01/22/2020) for 4-5 weeks for repeat UA and to review CT. .    CC: Dr. [name].      Irine Seal 12/11/2019 [id number]

## 2019-12-15 ENCOUNTER — Other Ambulatory Visit: Payer: Self-pay | Admitting: Family Medicine

## 2019-12-24 ENCOUNTER — Encounter: Payer: Self-pay | Admitting: Urology

## 2020-01-05 ENCOUNTER — Other Ambulatory Visit: Payer: Self-pay | Admitting: Urology

## 2020-01-05 ENCOUNTER — Other Ambulatory Visit: Payer: Self-pay | Admitting: Family Medicine

## 2020-01-05 DIAGNOSIS — N952 Postmenopausal atrophic vaginitis: Secondary | ICD-10-CM

## 2020-01-12 ENCOUNTER — Other Ambulatory Visit: Payer: Self-pay | Admitting: Family Medicine

## 2020-01-12 ENCOUNTER — Encounter: Payer: Self-pay | Admitting: Family Medicine

## 2020-01-14 ENCOUNTER — Other Ambulatory Visit: Payer: Self-pay

## 2020-01-14 ENCOUNTER — Ambulatory Visit (HOSPITAL_COMMUNITY)
Admission: RE | Admit: 2020-01-14 | Discharge: 2020-01-14 | Disposition: A | Discharge status: Home / Self Care | Payer: PPO | Source: Ambulatory Visit | Attending: Urology | Admitting: Urology

## 2020-01-14 DIAGNOSIS — N952 Postmenopausal atrophic vaginitis: Secondary | ICD-10-CM | POA: Diagnosis not present

## 2020-01-14 DIAGNOSIS — N200 Calculus of kidney: Secondary | ICD-10-CM | POA: Insufficient documentation

## 2020-01-14 DIAGNOSIS — I700 Atherosclerosis of aorta: Secondary | ICD-10-CM | POA: Insufficient documentation

## 2020-01-18 NOTE — Progress Notes (Signed)
Results sent per Dr. Alyson Ingles

## 2020-01-22 ENCOUNTER — Encounter: Payer: Self-pay | Admitting: Urology

## 2020-01-22 ENCOUNTER — Other Ambulatory Visit (HOSPITAL_COMMUNITY)
Admission: AD | Admit: 2020-01-22 | Discharge: 2020-01-22 | Disposition: A | Discharge status: Home / Self Care | Payer: PPO | Source: Other Acute Inpatient Hospital | Attending: Urology | Admitting: Urology

## 2020-01-22 ENCOUNTER — Other Ambulatory Visit: Payer: Self-pay

## 2020-01-22 ENCOUNTER — Ambulatory Visit (INDEPENDENT_AMBULATORY_CARE_PROVIDER_SITE_OTHER): Payer: PPO | Admitting: Urology

## 2020-01-22 VITALS — BP 115/76 | HR 118 | Temp 96.1°F | Ht 62.0 in | Wt 196.0 lb

## 2020-01-22 DIAGNOSIS — N3021 Other chronic cystitis with hematuria: Secondary | ICD-10-CM | POA: Insufficient documentation

## 2020-01-22 DIAGNOSIS — N200 Calculus of kidney: Secondary | ICD-10-CM | POA: Diagnosis not present

## 2020-01-22 LAB — POCT URINALYSIS DIPSTICK
Bilirubin, UA: NEGATIVE
Blood, UA: NEGATIVE
Glucose, UA: POSITIVE — AB
Ketones, UA: NEGATIVE
Nitrite, UA: NEGATIVE
Protein, UA: POSITIVE — AB
Spec Grav, UA: 1.025 (ref 1.010–1.025)
Urobilinogen, UA: 0.2 E.U./dL
pH, UA: 5 (ref 5.0–8.0)

## 2020-01-22 MED ORDER — CEFUROXIME AXETIL 500 MG PO TABS: 500.0000 mg | ORAL_TABLET | Freq: Two times a day (BID) | ORAL | 0 refills | Status: DC

## 2020-01-22 MED ORDER — TRIMETHOPRIM 100 MG PO TABS: 100.0000 mg | ORAL_TABLET | Freq: Every evening | ORAL | 11 refills | Status: DC

## 2020-01-22 NOTE — Progress Notes (Signed)
[id number] PM   JERNELL MCGLYNN [id number]  Referring provider: Kathyrn Drown, MD [address] [place],  [place] [address]  Dysuria  HPI: Ms Bidgood is a 64yo here for followup for nephrolithiasis and recurrent UTIs. UA today is concerning for infection. Last visit she was treated with keflex for a UTI. She was doing better and then 4 days ago she developed dysuria, worsening frequency and urgency.    PMH: Past Medical History:  Diagnosis Date  . Anxiety   . Asthma   . Depression   . Essential hypertension   . Fatty liver   . Gastritis   . Gastroparesis   . GERD (gastroesophageal reflux disease)   . Hiatal hernia   . History of cardiac catheterization    Minimal coronary atherosclerosis February 2016  . History of kidney stones   . History of stroke 2008  . HLD (hyperlipidemia)   . Neuropathy   . Obesity   . Pancreatitis   . Rheumatoid arthritis(714.0)   . Tibialis tendinitis   . Type 2 diabetes mellitus [facility])     Surgical History: Past Surgical History:  Procedure Laterality Date  . ANKLE SURGERY     remove extra bone-left  . CARPAL TUNNEL RELEASE     right side  . CARPAL TUNNEL RELEASE Left 02/06/2013   Procedure: CARPAL TUNNEL RELEASE ;  Surgeon: Carole Civil, MD;  Location: [facility];  Service: Orthopedics;  Laterality: Left;  procedure end 1135  . CARPAL TUNNEL RELEASE Right 09/14/2015   Procedure: RIGHT CARPAL TUNNEL RELEASE;  Surgeon: Carole Civil, MD;  Location: [facility];  Service: Orthopedics;  Laterality: Right;  . CARPAL TUNNEL RELEASE Left 09/28/2015   Procedure: LEFT CARPAL TUNNEL RELEASE;  Surgeon: Carole Civil, MD;  Location: [facility];  Service: Orthopedics;  Laterality: Left;  procedure 1  . CESAREAN SECTION    . CHOLECYSTECTOMY    . COLONOSCOPY WITH PROPOFOL  09/16/2012   [id number] sessile polyps ranging between 3-75mm in size were found in the sigmoid colon and rectum; polypectomy was performed/Mild  diverticulosis was noted throughout the entire examined colon/Small internal hemorrhoids  . DILATATION & CURETTAGE/HYSTEROSCOPY WITH MYOSURE N/A 08/05/2019   Procedure: DILATATION & CURETTAGE/HYSTEROSCOPY WITH MYOSURE;  Surgeon: Sloan Leiter, MD;  Location: [facility];  Service: Gynecology;  Laterality: N/A;  myosure rep will be here.  Confirmed on 07/30/19 CS  . DILATION AND CURETTAGE OF UTERUS     multiple  . ESOPHAGOGASTRODUODENOSCOPY  07/29/2009   Mild gastritis, benign path  . ESOPHAGOGASTRODUODENOSCOPY (EGD) WITH PROPOFOL  09/16/2012   SLF:Non-erosive gastritis (inflammation) was found; multiple bx/The duodenal mucosa showed no abnormalities in the ampulla and bulb and second portion of the duodenum/NAUSEA/VOMITING MOST LIKELY DUE TO GERD/GASTRITIS  . FINGER SURGERY Left    left little finger-otif of finger  . Gastric Emptying  07/27/2009   The amount of activity in the stomach at 120 minutes was 13% which is in the normal range  . LASER ABLATION OF THE CERVIX    . LEFT AND RIGHT HEART CATHETERIZATION WITH CORONARY ANGIOGRAM N/A 01/03/2015   Procedure: LEFT AND RIGHT HEART CATHETERIZATION WITH CORONARY ANGIOGRAM;  Surgeon: Blane Ohara, MD;  Location: [facility] CATH LAB;  Service: Cardiovascular;  Laterality: N/A;  . LITHOTRIPSY    . POLYPECTOMY  09/16/2012   Procedure: POLYPECTOMY;  Surgeon: Danie Binder, MD;  Location: [facility];  Service: Endoscopy;  Laterality: N/A;  . SAVORY DILATION  09/16/2012   Procedure: SAVORY DILATION;  Surgeon: Danie Binder, MD;  Location: [facility];  Service: Endoscopy;  Laterality: N/A;  16 fr dilation  . TRIGGER FINGER RELEASE Left 02/06/2013   Procedure: RELEASE TRIGGER FINGER LEFT LONG FINGER/A-1 PULLEY;  Surgeon: Carole Civil, MD;  Location: [facility];  Service: Orthopedics;  Laterality: Left;  procedure began 1136  . TRIGGER FINGER RELEASE Right 09/14/2015   Procedure: RIGHT LONG FINGER TRIGGER RELEASE;  Surgeon: Carole Civil, MD;   Location[facility];  Service: Orthopedics;  Laterality: Right;  . TRIGGER FINGER RELEASE Left 09/28/2015   Procedure: LEFT RING TRIGGER FINGER RELEASE;  Surgeon: Carole Civil, MD;  Location: [facility];  Service: Orthopedics;  Laterality: Left;  left ring finger  . TUBAL LIGATION      Home Medications:  Allergies as of 01/22/2020      Reactions   Byetta 10 Mcg Pen [exenatide] Diarrhea, Nausea And Vomiting     touch of pancreatis   Naproxen Nausea And Vomiting   Augmentin [amoxicillin-pot Clavulanate]    diarrhea   Azithromycin Nausea And Vomiting, Rash   Erythromycin Hives       Invokana [canagliflozin]    Urinary issues,yeast infection   Morphine Hives, Rash   Sulfonamide Derivatives Nausea And Vomiting, Rash      Medication List       Accurate as of January 22, 2020  2:25 PM. If you have any questions, ask your nurse or doctor.        albuterol 108 (90 Base) MCG/ACT inhaler Commonly known as: Ventolin HFA INHALE 2 PUFFS INTO THE LUNGS EVERY 4 (FOUR) HOURS AS NEEDED FOR WHEEZING.   amLODipine 10 MG tablet Commonly known as: NORVASC Take 1 tablet (10 mg total) by mouth daily.   aspirin EC 81 MG tablet Take 81 mg by mouth daily.   cetirizine 10 MG tablet Commonly known as: ZYRTEC TAKE ONE TABLET (10MG  TOTAL) BY MOUTH DAILY   cyanocobalamin 1000 MCG tablet Take 1,000 mcg by mouth daily. Reported on 02/02/2016   doxycycline 100 MG tablet Commonly known as: VIBRA-TABS Take 1 tablet (100 mg total) by mouth 2 (two) times daily.   estradiol 0.1 MG/GM vaginal cream Commonly known as: ESTRACE Place [id number] Applicatorfuls vaginally at bedtime. Start with a nightly application and you can use a pea sized amount on the tip of the finger instead of the applicator.  After 2 weeks you can reduce to 2-3 x weekly.   fluticasone 50 MCG/ACT nasal spray Commonly known as: FLONASE Place 2 sprays into both nostrils daily.   HYDROcodone-acetaminophen 7.5-325 MG tablet Commonly known as:  NORCO Take 1 tablet by mouth every 6 (six) hours as needed for moderate pain.   HYDROcodone-acetaminophen 7.5-325 MG tablet Commonly known as: NORCO Take one tablet po every 6 hrs prn moderate pain   HYDROcodone-acetaminophen 7.5-325 MG tablet Commonly known as: NORCO Take one tablet po every 6 hrs prn moderate pain   insulin detemir 100 UNIT/ML injection Commonly known as: LEVEMIR 70 units in the am and 60 units qhs 70 units in the am and 60 units qhs   lisinopril 20 MG tablet Commonly known as: ZESTRIL TAKE ONE AND ONE-HALF TABLET (30MG  TOTAL) BY MOUTH DAILY   metFORMIN 1000 MG tablet Commonly known as: GLUCOPHAGE TAKE 1 TABLET (1000 MG TOTAL) BY MOUTH 2TIMES A DAY.   multivitamin with minerals Tabs tablet Take 1 tablet by mouth daily.   mupirocin ointment 2 % Commonly known as:  Bactroban Apply 1 application topically 2 (two) times daily.   NovoLOG FlexPen 100 UNIT/ML FlexPen Generic drug: insulin aspart Inject 22 Units into the skin 3 (three) times daily with meals.   ondansetron 8 MG tablet Commonly known as: ZOFRAN TAKE ONE TABLET BY MOUTH EVERY 12 HOURS AS NEEDED FOR NAUSEA   pantoprazole 40 MG tablet Commonly known as: PROTONIX TAKE 1 TABLET (40 MG TOTAL) BY MOUTH DAILY.   promethazine 25 MG tablet Commonly known as: PHENERGAN TAKE ONE TABLET BY MOUTH EVERY EIGHT HOURS AS NEEDED FOR NAUSEA   pyridOXINE 100 MG tablet Commonly known as: VITAMIN B-6 Take 100 mg by mouth daily. Reported on 02/02/2016   rosuvastatin 20 MG tablet Commonly known as: CRESTOR TAKE ONE TABLET BY MOUTH ONCE DAILY.   Shingrix injection Generic drug: Zoster Vaccine Adjuvanted   sitaGLIPtin 100 MG tablet Commonly known as: Januvia Take 1 tablet (100 mg total) by mouth daily.   triamcinolone cream 0.1 % Commonly known as: KENALOG Apply BID prn   venlafaxine 75 MG tablet Commonly known as: EFFEXOR Take three tablets daily   Vitamin D2 50 MCG (2000 UT) Tabs Take by mouth  daily.       Allergies:  Allergies  Allergen Reactions  . Byetta 10 Mcg Pen [Exenatide] Diarrhea and Nausea And Vomiting      touch of pancreatis  . Naproxen Nausea And Vomiting  . Augmentin [Amoxicillin-Pot Clavulanate]     diarrhea  . Azithromycin Nausea And Vomiting and Rash  . Erythromycin Hives       . Invokana [Canagliflozin]     Urinary issues,yeast infection  . Morphine Hives and Rash  . Sulfonamide Derivatives Nausea And Vomiting and Rash    Family History: Family History  Problem Relation Age of Onset  . Breast cancer Mother   . Diabetes Father   . Coronary artery disease Other   . Arthritis Other   . Asthma Other   . Diabetes Sister   . Colon cancer Neg Hx     Social History:  reports that she quit smoking about 32 years ago. Her smoking use included cigarettes. She has a 1.50 pack-year smoking history. She has never used smokeless tobacco. She reports that she does not drink alcohol or use drugs.  ROS: All other review of systems were reviewed and are negative except what is noted above in HPI  Physical Exam: BP 115/76   Pulse (!) 118   Temp (!) 96.1 F (35.6 C)   Ht 5\' 2"  (1.575 m)   Wt 196 lb (88.9 kg)   BMI 35.85 kg/m   Constitutional:  Alert and oriented, No acute distress. HEENT: [place] AT, moist mucus membranes.  Trachea midline, no masses. Cardiovascular: No clubbing, cyanosis, or edema. Respiratory: Normal respiratory effort, no increased work of breathing. GI: Abdomen is soft, nontender, nondistended, no abdominal masses GU: No CVA tenderness Lymph: No cervical or inguinal lymphadenopathy. Skin: No rashes, bruises or suspicious lesions. Neurologic: Grossly intact, no focal deficits, moving all 4 extremities. Psychiatric: Normal mood and affect.  Laboratory Data: Lab Results  Component Value Date   WBC 10.5 08/04/2019   HGB 13.8 08/04/2019   HCT 39.0 08/04/2019   MCV 85.5 08/04/2019   PLT 310 08/04/2019    Lab Results  Component  Value Date   CREATININE 1.19 (H) 09/03/2019    No results found for: PSA  No results found for: TESTOSTERONE  Lab Results  Component Value Date   HGBA1C 6.9 (A) 09/07/2019  Urinalysis    Component Value Date/Time   COLORURINE YELLOW 11/05/2013 [place] 11/05/2013 1205   LABSPEC 1.020 11/05/2013 1205   PHURINE 5.5 11/05/2013 1205   GLUCOSEU >1000 (A) 11/05/2013 1205   HGBUR NEGATIVE 11/05/2013 1205   BILIRUBINUR ++ 12/11/2019 1403   KETONESUR 15 (A) 11/05/2013 1205   PROTEINUR Negative 12/11/2019 1403   PROTEINUR NEGATIVE 11/05/2013 1205   UROBILINOGEN negative (A) 12/11/2019 1403   UROBILINOGEN 0.2 11/05/2013 1205   NITRITE neg 12/11/2019 1403   NITRITE NEGATIVE 11/05/2013 1205   LEUKOCYTESUR Negative 12/11/2019 1403    Lab Results  Component Value Date   LABMICR 25.2 11/22/2017   BACTERIA neg 06/17/2015    Pertinent Imaging: CT stone study 01/14/2020: Images reviewed and discussed with patient Results for orders placed during the hospital encounter of 05/26/14  DG Abd 1 View   Narrative CLINICAL DATA:  Renal calculus  EXAM: ABDOMEN - 1 VIEW  COMPARISON:  November 20, 2013.  FINDINGS: There is fairly extensive stool throughout the colon. The overall bowel gas pattern is unremarkable without obstruction or free air. There is a 3 mm calcification in the mid right pelvis which is an on the previous study and probably represents a phlebolith. No other abnormal calcifications are identified. There are surgical clips in the gallbladder fossa region.  IMPRESSION: There is a 3 mm calcification in the lateral right mid pelvis which is stable compared to the prior study and may well represent a levo. The no other abnormal calcifications are identified. Note that stool in the colon could obscure small renal or ureteral calculi. There is fairly diffuse stool throughout the colon.   Electronically Signed   By: Lowella Grip M.D.   On:  05/26/2014 14:25    No results found for this or any previous visit. No results found for this or any previous visit. No results found for this or any previous visit. No results found for this or any previous visit. No results found for this or any previous visit. No results found for this or any previous visit. Results for orders placed during the hospital encounter of 01/14/20  CT RENAL STONE STUDY   Narrative CLINICAL DATA:  Evaluate for urinary tract calculi. Right-sided abdominal pain for 6 months  EXAM: CT ABDOMEN AND PELVIS WITHOUT CONTRAST  TECHNIQUE: Multidetector CT imaging of the abdomen and pelvis was performed following the standard protocol without IV contrast.  COMPARISON:  09/25/2012.  FINDINGS: Lower chest: No acute abnormality.  Hepatobiliary: No focal liver abnormality is seen. Status post cholecystectomy. No biliary dilatation.  Pancreas: Unremarkable. No pancreatic ductal dilatation or surrounding inflammatory changes.  Spleen: Normal in size without focal abnormality.  Adrenals/Urinary Tract: Normal appearance of the adrenal glands.  Right upper pole renal calculus measures 3 mm, image 67/5. No hydronephrosis identified bilaterally. Urinary bladder is unremarkable. No bladder calculi identified.  Stomach/Bowel: Stomach is normal. The small bowel loops have a normal course and caliber without obstruction. The appendix is visualized and appears normal  Vascular/Lymphatic: Aortic atherosclerosis. No aneurysm. No abdominopelvic adenopathy.  Reproductive: Uterus and bilateral adnexa are unremarkable.  Other: No free fluid or fluid collections identified.  Musculoskeletal: Lumbar degenerative disc disease identified. Chronic calcified posterior disc herniation.  IMPRESSION: 1. No acute findings within the abdomen or pelvis. 2. Nonobstructing right upper pole renal calculus.  Aortic Atherosclerosis (ICD10-I70.0).   Electronically Signed    By: Kerby Moors M.D.   On: 01/14/2020 13:53     Assessment &  Plan:    1. Chronic cystitis with hematuria -Urine for culture, will call with results -We will trial trimethoprim 100mg  qhs -RTC 3 months with UA  2. Nephrolithiasis -observation -RTC 1 year with renal US   No follow-ups on file.  Nicolette Bang, MD  [facility] [place]

## 2020-01-22 NOTE — Patient Instructions (Signed)
Kidney Stones  Kidney stones are solid, rock-like deposits that form inside of the kidneys. The kidneys are a pair of organs that make urine. A kidney stone may form in a kidney and move into other parts of the urinary tract, including the tubes that connect the kidneys to the bladder (ureters), the bladder, and the tube that carries urine out of the body (urethra). As the stone moves through these areas, it can cause intense pain and block the flow of urine. Kidney stones are created when high levels of certain minerals are found in the urine. The stones are usually passed out of the body through urination, but in some cases, medical treatment may be needed to remove them. What are the causes? Kidney stones may be caused by:  A condition in which certain glands produce too much parathyroid hormone (primary hyperparathyroidism), which causes too much calcium buildup in the blood.  A buildup of uric acid crystals in the bladder (hyperuricosuria). Uric acid is a chemical that the body produces when you eat certain foods. It usually exits the body in the urine.  Narrowing (stricture) of one or both of the ureters.  A kidney blockage that is present at birth (congenital obstruction).  Past surgery on the kidney or the ureters, such as gastric bypass surgery. What increases the risk? The following factors may make you more likely to develop this condition:  Having had a kidney stone in the past.  Having a family history of kidney stones.  Not drinking enough water.  Eating a diet that is high in protein, salt (sodium), or sugar.  Being overweight or obese. What are the signs or symptoms? Symptoms of a kidney stone may include:  Pain in the side of the abdomen, right below the ribs (flank pain). Pain usually spreads (radiates) to the groin.  Needing to urinate frequently or urgently.  Painful urination.  Blood in the urine (hematuria).  Nausea.  Vomiting.  Fever and chills. How  is this diagnosed? This condition may be diagnosed based on:  Your symptoms and medical history.  A physical exam.  Blood tests.  Urine tests. These may be done before and after the stone passes out of your body through urination.  Imaging tests, such as a CT scan, abdominal X-ray, or ultrasound.  A procedure to examine the inside of the bladder (cystoscopy). How is this treated? Treatment for kidney stones depends on the size, location, and makeup of the stones. Kidney stones will often pass out of the body through urination. You may need to:  Increase your fluid intake to help pass the stone. In some cases, you may be given fluids through an IV and may need to be monitored at the hospital.  Take medicine for pain.  Make changes in your diet to help prevent kidney stones from coming back. Sometimes, medical procedures are needed to remove a kidney stone. This may involve:  A procedure to break up kidney stones using: ? A focused beam of light (laser therapy). ? Shock waves (extracorporeal shock wave lithotripsy).  Surgery to remove kidney stones. This may be needed if you have severe pain or have stones that block your urinary tract. Follow these instructions at home: Medicines  Take over-the-counter and prescription medicines only as told by your health care provider.  Ask your health care provider if the medicine prescribed to you requires you to avoid driving or using heavy machinery. Eating and drinking  Drink enough fluid to keep your urine pale yellow.   You may be instructed to drink at least 8-10 glasses of water each day. This will help you pass the kidney stone.  If directed, change your diet. This may include: ? Limiting how much sodium you eat. ? Eating more fruits and vegetables. ? Limiting how much animal protein--such as red meat, poultry, fish, and eggs--you eat.  Follow instructions from your health care provider about eating or drinking  restrictions. General instructions  Collect urine samples as told by your health care provider. You may need to collect a urine sample: ? 24 hours after you pass the stone. ? 8-12 weeks after passing the kidney stone, and every 6-12 months after that.  Strain your urine every time you urinate, for as long as directed. Use the strainer that your health care provider recommends.  Do not throw out the kidney stone after passing it. Keep the stone so it can be tested by your health care provider. Testing the makeup of your kidney stone may help prevent you from getting kidney stones in the future.  Keep all follow-up visits as told by your health care provider. This is important. You may need follow-up X-rays or ultrasounds to make sure that your stone has passed. How is this prevented? To prevent another kidney stone:  Drink enough fluid to keep your urine pale yellow. This is the best way to prevent kidney stones.  Eat a healthy diet and follow recommendations from your health care provider about foods to avoid. You may be instructed to eat a low-protein diet. Recommendations vary depending on the type of kidney stone that you have.  Maintain a healthy weight. Where to find more information  National Kidney Foundation (NKF): www.kidney.org  [employer] (UCF): www.urologyhealth.org Contact a health care provider if:  You have pain that gets worse or does not get better with medicine. Get help right away if:  You have a fever or chills.  You develop severe pain.  You develop new abdominal pain.  You faint.  You are unable to urinate. Summary  Kidney stones are solid, rock-like deposits that form inside of the kidneys.  Kidney stones can cause nausea, vomiting, blood in the urine, abdominal pain, and the urge to urinate frequently.  Treatment for kidney stones depends on the size, location, and makeup of the stones. Kidney stones will often pass out of the body  through urination.  Kidney stones can be prevented by drinking enough fluids, eating a healthy diet, and maintaining a healthy weight. This information is not intended to replace advice given to you by your health care provider. Make sure you discuss any questions you have with your health care provider. Document Revised: 03/24/2019 Document Reviewed: 03/24/2019 Elsevier Patient Education  2020 [employer] Inc.  

## 2020-01-22 NOTE — Progress Notes (Signed)

## 2020-01-24 LAB — URINE CULTURE: Culture: 50000 — AB

## 2020-02-02 ENCOUNTER — Other Ambulatory Visit: Payer: Self-pay | Admitting: *Deleted

## 2020-02-02 ENCOUNTER — Telehealth: Payer: Self-pay | Admitting: Family Medicine

## 2020-02-02 DIAGNOSIS — E1159 Type 2 diabetes mellitus with other circulatory complications: Secondary | ICD-10-CM

## 2020-02-02 DIAGNOSIS — I10 Essential (primary) hypertension: Secondary | ICD-10-CM

## 2020-02-02 DIAGNOSIS — Z79899 Other long term (current) drug therapy: Secondary | ICD-10-CM

## 2020-02-02 DIAGNOSIS — E785 Hyperlipidemia, unspecified: Secondary | ICD-10-CM

## 2020-02-02 NOTE — Telephone Encounter (Signed)
[facility]ders [facility] to [facility] and pt was notified. She wanted orders mailed to her instead of picking up. I put them in the mail

## 2020-02-02 NOTE — Telephone Encounter (Signed)
Nurses Patient via [place] via her husband's account requested the papers for her lab work Her labs were ordered back in January she would like to have these completed at [facility] If those orders from January were for [facility] please change them to [facility] otherwise patient can get her lab work done soon through [facility] with those orders If problems or questions let me know

## 2020-02-03 ENCOUNTER — Other Ambulatory Visit: Payer: Self-pay

## 2020-02-05 ENCOUNTER — Other Ambulatory Visit: Payer: Self-pay | Admitting: Family Medicine

## 2020-02-05 DIAGNOSIS — Z79899 Other long term (current) drug therapy: Secondary | ICD-10-CM | POA: Diagnosis not present

## 2020-02-05 DIAGNOSIS — E785 Hyperlipidemia, unspecified: Secondary | ICD-10-CM | POA: Diagnosis not present

## 2020-02-05 DIAGNOSIS — E1159 Type 2 diabetes mellitus with other circulatory complications: Secondary | ICD-10-CM | POA: Diagnosis not present

## 2020-02-05 DIAGNOSIS — I10 Essential (primary) hypertension: Secondary | ICD-10-CM | POA: Diagnosis not present

## 2020-02-06 LAB — LIPID PANEL
Cholesterol: 142 mg/dL (ref <200)
HDL: 47 mg/dL — ABNORMAL LOW (ref >=50)
LDL Cholesterol (Calc): 77 mg/dL (calc)
Non-HDL Cholesterol (Calc): 95 mg/dL (calc) (ref <130)
Total CHOL/HDL Ratio: 3 (calc) (ref <5.0)
Triglycerides: 101 mg/dL (ref <150)

## 2020-02-06 LAB — HEPATIC FUNCTION PANEL
AG Ratio: 1.4 (calc) (ref 1.0–2.5)
ALT: 16 U/L (ref 6–29)
AST: 19 U/L (ref 10–35)
Albumin: 4.2 g/dL (ref 3.6–5.1)
Alkaline phosphatase (APISO): 53 U/L (ref 37–153)
Bilirubin, Direct: 0.1 mg/dL (ref 0.0–0.2)
Globulin: 3 g/dL (calc) (ref 1.9–3.7)
Indirect Bilirubin: 0.3 mg/dL (calc) (ref 0.2–1.2)
Total Bilirubin: 0.4 mg/dL (ref 0.2–1.2)
Total Protein: 7.2 g/dL (ref 6.1–8.1)

## 2020-02-06 LAB — BASIC METABOLIC PANEL
BUN/Creatinine Ratio: 15 (calc) (ref 6–22)
BUN: 18 mg/dL (ref 7–25)
CO2: 26 mmol/L (ref 20–32)
Calcium: 9.8 mg/dL (ref 8.6–10.4)
Chloride: 103 mmol/L (ref 98–110)
Creat: 1.17 mg/dL — ABNORMAL HIGH (ref 0.50–0.99)
Glucose, Bld: 228 mg/dL — ABNORMAL HIGH (ref 65–99)
Potassium: 4.4 mmol/L (ref 3.5–5.3)
Sodium: 139 mmol/L (ref 135–146)

## 2020-02-06 LAB — HEMOGLOBIN A1C
Hgb A1c MFr Bld: 7.9 % of total Hgb — ABNORMAL HIGH (ref <5.7)
Mean Plasma Glucose: 180 (calc)
eAG (mmol/L): 10 (calc)

## 2020-02-22 DIAGNOSIS — I6930 Unspecified sequelae of cerebral infarction: Secondary | ICD-10-CM | POA: Insufficient documentation

## 2020-02-22 DIAGNOSIS — R270 Ataxia, unspecified: Secondary | ICD-10-CM | POA: Insufficient documentation

## 2020-03-16 ENCOUNTER — Ambulatory Visit (INDEPENDENT_AMBULATORY_CARE_PROVIDER_SITE_OTHER): Payer: PPO | Admitting: Family Medicine

## 2020-03-16 ENCOUNTER — Other Ambulatory Visit: Payer: Self-pay

## 2020-03-16 VITALS — BP 136/84 | HR 113 | Temp 98.2°F | Wt 197.4 lb

## 2020-03-16 DIAGNOSIS — G3281 Cerebellar ataxia in diseases classified elsewhere: Secondary | ICD-10-CM

## 2020-03-16 DIAGNOSIS — R270 Ataxia, unspecified: Secondary | ICD-10-CM | POA: Diagnosis not present

## 2020-03-16 DIAGNOSIS — R2689 Other abnormalities of gait and mobility: Secondary | ICD-10-CM | POA: Diagnosis not present

## 2020-03-16 MED ORDER — MECLIZINE HCL 25 MG PO TABS: 25.0000 mg | ORAL_TABLET | Freq: Three times a day (TID) | ORAL | 0 refills | Status: DC | PRN

## 2020-03-16 NOTE — Progress Notes (Signed)
   Subjective:    Patient ID: Jamie Harding, female    DOB: 09-May-1956, 64 y.o.   MRN: [id number]  HPI Patient comes in today with complaints of dizziness since yesterday morning.  Patient states she feels off balance in her head.  She denies any spinning.  She states she feels like she is unsteady when she gets up.  Occasionally has to hold onto things.  Denies nausea vomiting denies any severe headache.  Does have risk factors for stroke.  Patient states she also feels like her left foot is asleep; heavy and numb since yesterday.  She feels like the bottom of her foot feels numb like and feels somewhat thick She denies unilateral weakness No lifestyle changes patient is aware of.    Review of Systems Please see above denies chest tightness pressure pain shortness of breath    Objective:   Physical Exam Lungs clear respiratory rate normal heart regular no murmurs abdomen obese extremities no edema patient has good grip bilateral.  Patient has good dorsiflexion and plantar flexion bilateral. No facial droop Patient was observed walking in the hallways.  She is able to walk down the hallway turnaround and come back without significant imbalance but does have some mild ataxia       Assessment & Plan:  Significant ataxia Feeling of dizziness No unilateral weakness Given that this started well over 24 hours ago the patient is not within the window of TPA Not vertigo Needs MRI to rule out stroke Warning signs were discussed

## 2020-03-17 ENCOUNTER — Observation Stay (HOSPITAL_COMMUNITY)
Admission: RE | Admit: 2020-03-17 | Discharge: 2020-03-17 | Disposition: A | Discharge status: Home / Self Care | Payer: PPO | Source: Ambulatory Visit | Attending: Family Medicine | Admitting: Family Medicine

## 2020-03-17 ENCOUNTER — Other Ambulatory Visit: Payer: Self-pay

## 2020-03-17 ENCOUNTER — Inpatient Hospital Stay (HOSPITAL_COMMUNITY): Payer: PPO

## 2020-03-17 ENCOUNTER — Observation Stay (HOSPITAL_COMMUNITY)
Admission: EM | Admit: 2020-03-17 | Discharge: 2020-03-18 | Disposition: A | Discharge status: Home / Self Care | Payer: PPO | Attending: Family Medicine | Admitting: Family Medicine

## 2020-03-17 ENCOUNTER — Encounter (HOSPITAL_COMMUNITY): Payer: Self-pay

## 2020-03-17 ENCOUNTER — Other Ambulatory Visit: Payer: Self-pay | Admitting: Family Medicine

## 2020-03-17 DIAGNOSIS — Z8673 Personal history of transient ischemic attack (TIA), and cerebral infarction without residual deficits: Secondary | ICD-10-CM

## 2020-03-17 DIAGNOSIS — G3281 Cerebellar ataxia in diseases classified elsewhere: Secondary | ICD-10-CM | POA: Insufficient documentation

## 2020-03-17 DIAGNOSIS — Z9861 Coronary angioplasty status: Secondary | ICD-10-CM | POA: Insufficient documentation

## 2020-03-17 DIAGNOSIS — G894 Chronic pain syndrome: Secondary | ICD-10-CM | POA: Diagnosis not present

## 2020-03-17 DIAGNOSIS — Z794 Long term (current) use of insulin: Secondary | ICD-10-CM | POA: Insufficient documentation

## 2020-03-17 DIAGNOSIS — R2689 Other abnormalities of gait and mobility: Secondary | ICD-10-CM | POA: Insufficient documentation

## 2020-03-17 DIAGNOSIS — R1310 Dysphagia, unspecified: Secondary | ICD-10-CM | POA: Diagnosis not present

## 2020-03-17 DIAGNOSIS — E785 Hyperlipidemia, unspecified: Secondary | ICD-10-CM | POA: Diagnosis not present

## 2020-03-17 DIAGNOSIS — E1159 Type 2 diabetes mellitus with other circulatory complications: Secondary | ICD-10-CM | POA: Diagnosis not present

## 2020-03-17 DIAGNOSIS — I6350 Cerebral infarction due to unspecified occlusion or stenosis of unspecified cerebral artery: Principal | ICD-10-CM | POA: Insufficient documentation

## 2020-03-17 DIAGNOSIS — R531 Weakness: Secondary | ICD-10-CM | POA: Diagnosis not present

## 2020-03-17 DIAGNOSIS — I10 Essential (primary) hypertension: Secondary | ICD-10-CM | POA: Diagnosis not present

## 2020-03-17 DIAGNOSIS — F329 Major depressive disorder, single episode, unspecified: Secondary | ICD-10-CM | POA: Diagnosis not present

## 2020-03-17 DIAGNOSIS — Z7982 Long term (current) use of aspirin: Secondary | ICD-10-CM | POA: Diagnosis not present

## 2020-03-17 DIAGNOSIS — G8194 Hemiplegia, unspecified affecting left nondominant side: Secondary | ICD-10-CM | POA: Diagnosis not present

## 2020-03-17 DIAGNOSIS — M21372 Foot drop, left foot: Secondary | ICD-10-CM | POA: Diagnosis present

## 2020-03-17 DIAGNOSIS — I6523 Occlusion and stenosis of bilateral carotid arteries: Secondary | ICD-10-CM | POA: Diagnosis not present

## 2020-03-17 DIAGNOSIS — F419 Anxiety disorder, unspecified: Secondary | ICD-10-CM | POA: Diagnosis present

## 2020-03-17 DIAGNOSIS — I639 Cerebral infarction, unspecified: Secondary | ICD-10-CM

## 2020-03-17 DIAGNOSIS — I69952 Hemiplegia and hemiparesis following unspecified cerebrovascular disease affecting left dominant side: Secondary | ICD-10-CM | POA: Diagnosis not present

## 2020-03-17 DIAGNOSIS — E1169 Type 2 diabetes mellitus with other specified complication: Secondary | ICD-10-CM | POA: Diagnosis present

## 2020-03-17 DIAGNOSIS — E7849 Other hyperlipidemia: Secondary | ICD-10-CM

## 2020-03-17 DIAGNOSIS — I6389 Other cerebral infarction: Secondary | ICD-10-CM | POA: Diagnosis not present

## 2020-03-17 DIAGNOSIS — G8190 Hemiplegia, unspecified affecting unspecified side: Secondary | ICD-10-CM | POA: Insufficient documentation

## 2020-03-17 DIAGNOSIS — Z20822 Contact with and (suspected) exposure to covid-19: Secondary | ICD-10-CM | POA: Insufficient documentation

## 2020-03-17 DIAGNOSIS — F32A Depression, unspecified: Secondary | ICD-10-CM | POA: Diagnosis present

## 2020-03-17 DIAGNOSIS — Z87891 Personal history of nicotine dependence: Secondary | ICD-10-CM | POA: Diagnosis not present

## 2020-03-17 DIAGNOSIS — R42 Dizziness and giddiness: Secondary | ICD-10-CM | POA: Diagnosis present

## 2020-03-17 DIAGNOSIS — Z79899 Other long term (current) drug therapy: Secondary | ICD-10-CM | POA: Diagnosis not present

## 2020-03-17 LAB — RESPIRATORY PANEL BY RT PCR (FLU A&B, COVID)
Influenza A by PCR: NEGATIVE
Influenza B by PCR: NEGATIVE
SARS Coronavirus 2 by RT PCR: NEGATIVE

## 2020-03-17 LAB — PROTIME-INR
INR: 1 (ref 0.8–1.2)
Prothrombin Time: 12.8 seconds (ref 11.4–15.2)

## 2020-03-17 LAB — CBC WITH DIFFERENTIAL/PLATELET
Abs Immature Granulocytes: 0.02 10*3/uL (ref 0.00–0.07)
Basophils Absolute: 0 10*3/uL (ref 0.0–0.1)
Basophils Relative: 1 %
Eosinophils Absolute: 0.2 10*3/uL (ref 0.0–0.5)
Eosinophils Relative: 3 %
HCT: 38.8 % (ref 36.0–46.0)
Hemoglobin: 12.9 g/dL (ref 12.0–15.0)
Immature Granulocytes: 0 %
Lymphocytes Relative: 39 %
Lymphs Abs: 3.1 10*3/uL (ref 0.7–4.0)
MCH: 29.1 pg (ref 26.0–34.0)
MCHC: 33.2 g/dL (ref 30.0–36.0)
MCV: 87.6 fL (ref 80.0–100.0)
Monocytes Absolute: 0.4 10*3/uL (ref 0.1–1.0)
Monocytes Relative: 6 %
Neutro Abs: 4.1 10*3/uL (ref 1.7–7.7)
Neutrophils Relative %: 51 %
Platelets: 259 10*3/uL (ref 150–400)
RBC: 4.43 MIL/uL (ref 3.87–5.11)
RDW: 13 % (ref 11.5–15.5)
WBC: 7.9 10*3/uL (ref 4.0–10.5)
nRBC: 0 % (ref 0.0–0.2)

## 2020-03-17 LAB — ECHOCARDIOGRAM COMPLETE
Height: 62 in
Weight: 3152 oz

## 2020-03-17 LAB — COMPREHENSIVE METABOLIC PANEL
ALT: 19 U/L (ref 0–44)
AST: 20 U/L (ref 15–41)
Albumin: 4 g/dL (ref 3.5–5.0)
Alkaline Phosphatase: 48 U/L (ref 38–126)
Anion gap: 10 (ref 5–15)
BUN: 16 mg/dL (ref 8–23)
CO2: 23 mmol/L (ref 22–32)
Calcium: 9.5 mg/dL (ref 8.9–10.3)
Chloride: 103 mmol/L (ref 98–111)
Creatinine, Ser: 1.13 mg/dL — ABNORMAL HIGH (ref 0.44–1.00)
GFR calc Af Amer: >60 mL/min (ref >60)
GFR calc non Af Amer: 52 mL/min — ABNORMAL LOW (ref >60)
Glucose, Bld: 216 mg/dL — ABNORMAL HIGH (ref 70–99)
Potassium: 4.2 mmol/L (ref 3.5–5.1)
Sodium: 136 mmol/L (ref 135–145)
Total Bilirubin: 0.4 mg/dL (ref 0.3–1.2)
Total Protein: 7.2 g/dL (ref 6.5–8.1)

## 2020-03-17 LAB — GLUCOSE, CAPILLARY
Glucose-Capillary: 230 mg/dL — ABNORMAL HIGH (ref 70–99)
Glucose-Capillary: 139 mg/dL — ABNORMAL HIGH (ref 70–99)

## 2020-03-17 MED ORDER — ALBUTEROL SULFATE (2.5 MG/3ML) 0.083% IN NEBU: 3.0000 mL | INHALATION_SOLUTION | RESPIRATORY_TRACT | Status: DC | PRN

## 2020-03-17 MED ORDER — SENNOSIDES-DOCUSATE SODIUM 8.6-50 MG PO TABS: 1.0000 | ORAL_TABLET | Freq: Every evening | ORAL | Status: DC | PRN

## 2020-03-17 MED ORDER — HYDROCODONE-ACETAMINOPHEN 7.5-325 MG PO TABS
1.0000 | ORAL_TABLET | Freq: Four times a day (QID) | ORAL | Status: DC | PRN
  Administered 2020-03-17 – 2020-03-18 (×3): 1 via ORAL
  Filled 2020-03-17 (×3): qty 1

## 2020-03-17 MED ORDER — ASPIRIN 300 MG RE SUPP
300.0000 mg | Freq: Every day | RECTAL | Status: DC
  Filled 2020-03-17: qty 1

## 2020-03-17 MED ORDER — INSULIN ASPART 100 UNIT/ML ~~LOC~~ SOLN
14.0000 [IU] | Freq: Three times a day (TID) | SUBCUTANEOUS | Status: DC
  Administered 2020-03-17 – 2020-03-18 (×2): 14 [IU] via SUBCUTANEOUS

## 2020-03-17 MED ORDER — INSULIN DETEMIR 100 UNIT/ML ~~LOC~~ SOLN
50.0000 [IU] | Freq: Every day | SUBCUTANEOUS | Status: DC
  Administered 2020-03-17: 50 [IU] via SUBCUTANEOUS
  Filled 2020-03-17 (×4): qty 0.5

## 2020-03-17 MED ORDER — CLOPIDOGREL BISULFATE 75 MG PO TABS
75.0000 mg | ORAL_TABLET | Freq: Every day | ORAL | Status: DC
  Administered 2020-03-18: 75 mg via ORAL
  Filled 2020-03-17: qty 1

## 2020-03-17 MED ORDER — LORATADINE 10 MG PO TABS
10.0000 mg | ORAL_TABLET | Freq: Every day | ORAL | Status: DC
  Administered 2020-03-17 – 2020-03-18 (×2): 10 mg via ORAL
  Filled 2020-03-17 (×2): qty 1

## 2020-03-17 MED ORDER — ASPIRIN 325 MG PO TABS
325.0000 mg | ORAL_TABLET | Freq: Every day | ORAL | Status: DC
  Administered 2020-03-18: 325 mg via ORAL
  Filled 2020-03-17: qty 1

## 2020-03-17 MED ORDER — STROKE: EARLY STAGES OF RECOVERY BOOK: Freq: Once | Status: AC

## 2020-03-17 MED ORDER — FLUTICASONE PROPIONATE 50 MCG/ACT NA SUSP
2.0000 | Freq: Every day | NASAL | Status: DC
  Administered 2020-03-17: 2 via NASAL
  Filled 2020-03-17: qty 16

## 2020-03-17 MED ORDER — TRIMETHOPRIM 100 MG PO TABS
100.0000 mg | ORAL_TABLET | Freq: Every day | ORAL | Status: DC
  Administered 2020-03-17: 100 mg via ORAL
  Filled 2020-03-17 (×4): qty 1

## 2020-03-17 MED ORDER — ACETAMINOPHEN 160 MG/5ML PO SOLN: 650.0000 mg | ORAL | Status: DC | PRN

## 2020-03-17 MED ORDER — ASPIRIN 81 MG PO CHEW
324.0000 mg | CHEWABLE_TABLET | Freq: Once | ORAL | Status: AC
  Administered 2020-03-17: 324 mg via ORAL
  Filled 2020-03-17: qty 4

## 2020-03-17 MED ORDER — ENOXAPARIN SODIUM 40 MG/0.4ML ~~LOC~~ SOLN
40.0000 mg | SUBCUTANEOUS | Status: DC
  Administered 2020-03-17: 40 mg via SUBCUTANEOUS
  Filled 2020-03-17: qty 0.4

## 2020-03-17 MED ORDER — ACETAMINOPHEN 650 MG RE SUPP: 650.0000 mg | RECTAL | Status: DC | PRN

## 2020-03-17 MED ORDER — INSULIN ASPART 100 UNIT/ML ~~LOC~~ SOLN: 0.0000 [IU] | Freq: Every day | SUBCUTANEOUS | Status: DC

## 2020-03-17 MED ORDER — PANTOPRAZOLE SODIUM 40 MG PO TBEC
40.0000 mg | DELAYED_RELEASE_TABLET | Freq: Every day | ORAL | Status: DC
  Administered 2020-03-17 – 2020-03-18 (×2): 40 mg via ORAL
  Filled 2020-03-17 (×2): qty 1

## 2020-03-17 MED ORDER — ACETAMINOPHEN 325 MG PO TABS: 650.0000 mg | ORAL_TABLET | ORAL | Status: DC | PRN

## 2020-03-17 MED ORDER — ADULT MULTIVITAMIN W/MINERALS CH
1.0000 | ORAL_TABLET | Freq: Every day | ORAL | Status: DC
  Administered 2020-03-17 – 2020-03-18 (×2): 1 via ORAL
  Filled 2020-03-17 (×2): qty 1

## 2020-03-17 MED ORDER — MECLIZINE HCL 12.5 MG PO TABS: 25.0000 mg | ORAL_TABLET | Freq: Three times a day (TID) | ORAL | Status: DC | PRN

## 2020-03-17 MED ORDER — ROSUVASTATIN CALCIUM 20 MG PO TABS
20.0000 mg | ORAL_TABLET | Freq: Every evening | ORAL | Status: DC
  Administered 2020-03-17: 20 mg via ORAL
  Filled 2020-03-17: qty 1

## 2020-03-17 MED ORDER — INSULIN ASPART 100 UNIT/ML ~~LOC~~ SOLN
0.0000 [IU] | Freq: Three times a day (TID) | SUBCUTANEOUS | Status: DC
  Administered 2020-03-17 – 2020-03-18 (×2): 5 [IU] via SUBCUTANEOUS

## 2020-03-17 MED ORDER — VENLAFAXINE HCL 37.5 MG PO TABS
75.0000 mg | ORAL_TABLET | Freq: Three times a day (TID) | ORAL | Status: DC
  Administered 2020-03-17 – 2020-03-18 (×3): 75 mg via ORAL
  Filled 2020-03-17 (×3): qty 2

## 2020-03-17 MED ORDER — SODIUM CHLORIDE 0.9 % IV SOLN: INTRAVENOUS | Status: DC

## 2020-03-17 NOTE — H&P (Signed)
History and Physical  Jamie Harding [id number] DOB: [birth year] DOA: 03/17/2020  Referring physician: Reather Converse MD   PCP: Kathyrn Drown, MD   Chief Complaint: left foot drop  Pt coming from: Home   HPI: Jamie Harding is a 64 y.o. female with known cerebrovascular disease status post TIA, hypertension, hyperlipidemia, coronary artery disease, gastritis, GERD, obesity, former smoker who was sent to urgency department with findings of acute CVA on MRI that was ordered by her outpatient primary care provider.  She presented to her PCP a couple of days ago complaining of dizziness and unsteady gait and weakness of the left foot that has been present for the past 3 days.  Symptoms persisted and prompted her to seek an evaluation.  She was sent for an MRI and findings of acute infarct involving the left parasagittal pons noted.  Dr. Merlene Laughter with neurology was consulted by the ED physician recommended that patient be admitted to the hospital for stroke work-up and neurology consultation.  Patient has been started on aspirin full dose for antiplatelet therapy.  The patient reports that her type 2 diabetes mellitus has been intermittently controlled and her last A1c was 7.9%.  She takes Levemir twice daily and occasionally takes NovoLog prandial coverage.  She reports eating 1 large meal per day and snacks throughout the day.  The patient denies hypoglycemia.  The patient reports that she has been taking Crestor 20 mg for several years to control her dyslipidemia.  She is being admitted for further evaluation and management.  Review of Systems: All systems reviewed and apart from history of presenting illness, are negative.  Past Medical History:  Diagnosis Date  . Anxiety   . Asthma   . Depression   . Essential hypertension   . Fatty liver   . Gastritis   . Gastroparesis   . GERD (gastroesophageal reflux disease)   . Hiatal hernia   . History of cardiac catheterization    Minimal coronary  atherosclerosis February 2016  . History of kidney stones   . History of stroke 2008  . HLD (hyperlipidemia)   . Neuropathy   . Obesity   . Pancreatitis   . Rheumatoid arthritis(714.0)   . Stroke ([place])   . Tibialis tendinitis   . Type 2 diabetes mellitus ([place])    Past Surgical History:  Procedure Laterality Date  . ANKLE SURGERY     remove extra bone-left  . CARPAL TUNNEL RELEASE     right side  . CARPAL TUNNEL RELEASE Left 02/06/2013   Procedure: CARPAL TUNNEL RELEASE ;  Surgeon: Carole Civil, MD;  Location: [facility];  Service: Orthopedics;  Laterality: Left;  procedure end 1135  . CARPAL TUNNEL RELEASE Right 09/14/2015   Procedure: RIGHT CARPAL TUNNEL RELEASE;  Surgeon: Carole Civil, MD;  Location: [facility];  Service: Orthopedics;  Laterality: Right;  . CARPAL TUNNEL RELEASE Left 09/28/2015   Procedure: LEFT CARPAL TUNNEL RELEASE;  Surgeon: Carole Civil, MD;  Location: [facility];  Service: Orthopedics;  Laterality: Left;  procedure 1  . CESAREAN SECTION    . CHOLECYSTECTOMY    . COLONOSCOPY WITH PROPOFOL  09/16/2012   [id number] sessile polyps ranging between 3-61mm in size were found in the sigmoid colon and rectum; polypectomy was performed/Mild diverticulosis was noted throughout the entire examined colon/Small internal hemorrhoids  . DILATATION & CURETTAGE/HYSTEROSCOPY WITH MYOSURE N/A 08/05/2019   Procedure: DILATATION & CURETTAGE/HYSTEROSCOPY WITH MYOSURE;  Surgeon: Sloan Leiter, MD;  Location:  [facility];  Service: Gynecology;  Laterality: N/A;  myosure rep will be here.  Confirmed on 07/30/19 CS  . DILATION AND CURETTAGE OF UTERUS     multiple  . ESOPHAGOGASTRODUODENOSCOPY  07/29/2009   Mild gastritis, benign path  . ESOPHAGOGASTRODUODENOSCOPY (EGD) WITH PROPOFOL  09/16/2012   SLF:Non-erosive gastritis (inflammation) was found; multiple bx/The duodenal mucosa showed no abnormalities in the ampulla and bulb and second portion of the  duodenum/NAUSEA/VOMITING MOST LIKELY DUE TO GERD/GASTRITIS  . FINGER SURGERY Left    left little finger-otif of finger  . Gastric Emptying  07/27/2009   The amount of activity in the stomach at 120 minutes was 13% which is in the normal range  . LASER ABLATION OF THE CERVIX    . LEFT AND RIGHT HEART CATHETERIZATION WITH CORONARY ANGIOGRAM N/A 01/03/2015   Procedure: LEFT AND RIGHT HEART CATHETERIZATION WITH CORONARY ANGIOGRAM;  Surgeon: Blane Ohara, MD;  Location: [facility];  Service: Cardiovascular;  Laterality: N/A;  . LITHOTRIPSY    . POLYPECTOMY  09/16/2012   Procedure: POLYPECTOMY;  Surgeon: Danie Binder, MD;  Location: [facility];  Service: Endoscopy;  Laterality: N/A;  . SAVORY DILATION  09/16/2012   Procedure: SAVORY DILATION;  Surgeon: Danie Binder, MD;  Location: [facility];  Service: Endoscopy;  Laterality: N/A;  16 fr dilation  . TRIGGER FINGER RELEASE Left 02/06/2013   Procedure: RELEASE TRIGGER FINGER LEFT LONG FINGER/A-1 PULLEY;  Surgeon: Carole Civil, MD;  Location: [facility];  Service: Orthopedics;  Laterality: Left;  procedure began 1136  . TRIGGER FINGER RELEASE Right 09/14/2015   Procedure: RIGHT LONG FINGER TRIGGER RELEASE;  Surgeon: Carole Civil, MD;  Location: [facility];  Service: Orthopedics;  Laterality: Right;  . TRIGGER FINGER RELEASE Left 09/28/2015   Procedure: LEFT RING TRIGGER FINGER RELEASE;  Surgeon: Carole Civil, MD;  Location: [facility];  Service: Orthopedics;  Laterality: Left;  left ring finger  . TUBAL LIGATION     Social History:  reports that she quit smoking about 32 years ago. Her smoking use included cigarettes. She has a 1.50 pack-year smoking history. She has never used smokeless tobacco. She reports that she does not drink alcohol or use drugs.  Allergies  Allergen Reactions  . Byetta 10 Mcg Pen [Exenatide] Diarrhea and Nausea And Vomiting      touch of pancreatis  . Naproxen Nausea And Vomiting  . Augmentin [Amoxicillin-Pot  Clavulanate]     diarrhea  . Azithromycin Nausea And Vomiting and Rash  . Erythromycin Hives       . Invokana [Canagliflozin]     Urinary issues,yeast infection  . Morphine Hives and Rash  . Sulfonamide Derivatives Nausea And Vomiting and Rash    Family History  Problem Relation Age of Onset  . Breast cancer Mother   . Diabetes Father   . Coronary artery disease Other   . Arthritis Other   . Asthma Other   . Diabetes Sister   . Colon cancer Neg Hx     Prior to Admission medications   Medication Sig Start Date End Date Taking? Authorizing Provider  albuterol (VENTOLIN HFA) 108 (90 Base) MCG/ACT inhaler INHALE 2 PUFFS INTO THE LUNGS EVERY 4 (FOUR) HOURS AS NEEDED FOR WHEEZING. 12/19/18  Yes Luking, Scott A, MD  amLODipine (NORVASC) 10 MG tablet Take 1 tablet (10 mg total) by mouth daily. 12/08/19  Yes Kathyrn Drown, MD  aspirin EC 81 MG tablet Take 81 mg by  mouth daily.   Yes [provider]  cetirizine (ZYRTEC) 10 MG tablet TAKE ONE TABLET (10MG  TOTAL) BY MOUTH DAILY 12/15/19  Yes Luking, Scott A, MD  fluticasone (FLONASE) 50 MCG/ACT nasal spray Place 2 sprays into both nostrils daily. 12/04/16  Yes Kathyrn Drown, MD  HYDROcodone-acetaminophen (NORCO) 7.5-325 MG tablet Take one tablet po every 6 hrs prn moderate pain 12/08/19  Yes Luking, Scott A, MD  insulin detemir (LEVEMIR) 100 UNIT/ML injection 70 units in the am and 60 units qhs 70 units in the am and 60 units qhs 12/08/19  Yes Luking, Scott A, MD  lisinopril (ZESTRIL) 20 MG tablet TAKE ONE AND ONE-HALF TABLET (30MG  TOTAL) BY MOUTH DAILY 12/08/19  Yes Kathyrn Drown, MD  meclizine (ANTIVERT) 25 MG tablet Take 1 tablet (25 mg total) by mouth 3 (three) times daily as needed for dizziness. 03/16/20  Yes Luking, Elayne Snare, MD  metFORMIN (GLUCOPHAGE) 1000 MG tablet TAKE 1 TABLET (1000 MG TOTAL) BY MOUTH 2TIMES A DAY. 12/08/19  Yes Luking, Elayne Snare, MD  Multiple Vitamin (MULTIVITAMIN WITH MINERALS) TABS Take 1 tablet by mouth daily.    Yes [provider]  mupirocin ointment (BACTROBAN) 2 % Apply 1 application topically 2 (two) times daily. 04/09/16  Yes Luking, Elayne Snare, MD  NOVOLOG FLEXPEN 100 UNIT/ML FlexPen Inject 22 Units into the skin 3 (three) times daily with meals. Patient taking differently: Inject 15-20 Units into the skin 3 (three) times daily with meals.  12/08/19  Yes Luking, Scott A, MD  pantoprazole (PROTONIX) 40 MG tablet TAKE 1 TABLET (40 MG TOTAL) BY MOUTH DAILY. 12/08/19  Yes Luking, Elayne Snare, MD  promethazine (PHENERGAN) 25 MG tablet TAKE ONE TABLET BY MOUTH EVERY EIGHT HOURS AS NEEDED FOR NAUSEA 12/04/19  Yes Luking, Elayne Snare, MD  rosuvastatin (CRESTOR) 20 MG tablet TAKE ONE TABLET BY MOUTH ONCE DAILY. 01/08/20  Yes Luking, Scott A, MD  sitaGLIPtin (JANUVIA) 100 MG tablet Take 1 tablet (100 mg total) by mouth daily. 12/08/19  Yes Kathyrn Drown, MD  triamcinolone cream (KENALOG) 0.1 % Apply BID prn 09/07/19  Yes Luking, Scott A, MD  trimethoprim (TRIMPEX) 100 MG tablet Take 1 tablet (100 mg total) by mouth at bedtime. 01/22/20  Yes McKenzie, Candee Furbish, MD  venlafaxine [facility]) 75 MG tablet Take three tablets daily 12/08/19  Yes Luking, Elayne Snare, MD  estradiol (ESTRACE) 0.1 MG/GM vaginal cream Place [id number] Applicatorfuls vaginally at bedtime. Start with a nightly application and you can use a pea sized amount on the tip of the finger instead of the applicator.  After 2 weeks you can reduce to 2-3 x weekly. Patient not taking: Reported on 03/17/2020 12/11/19   Irine Seal, MD  HYDROcodone-acetaminophen [facility]) 7.5-325 MG tablet Take 1 tablet by mouth every 6 (six) hours as needed for moderate pain. Patient not taking: Reported on 03/17/2020 12/08/19   Kathyrn Drown, MD  HYDROcodone-acetaminophen [facility]) 7.5-325 MG tablet Take one tablet po every 6 hrs prn moderate pain 12/08/19   Kathyrn Drown, MD  ondansetron (ZOFRAN) 8 MG tablet TAKE ONE TABLET BY MOUTH EVERY 12 HOURS AS NEEDED FOR NAUSEA 02/07/20   Kathyrn Drown, MD  [facility] injection  09/03/19   [provider]   Physical Exam: Vitals:   03/17/20 1035 03/17/20 1100 03/17/20 1130 03/17/20 1220  BP: (!) 105/30 110/63 (!) 111/93 110/73  Pulse:  83 87 87  Resp: 18 17 (!) 21 15  Temp: 98.1 F (36.7  C)     TempSrc: Oral     SpO2:  97% 97% 95%  Weight:      Height:        General exam: Moderately built and nourished patient, lying comfortably supine on the gurney in no obvious distress.  Head, eyes and ENT: Nontraumatic and normocephalic. Pupils equally reacting to light and accommodation. Oral mucosa moist.  Neck: Supple. No JVD, carotid bruit or thyromegaly.  Lymphatics: No lymphadenopathy.  Respiratory system: Clear to auscultation. No increased work of breathing.  Cardiovascular system: S1 and S2 heard, RRR. No JVD, murmurs, gallops, clicks or pedal edema.  Gastrointestinal system: Abdomen is nondistended, soft and nontender. Normal bowel sounds heard. No organomegaly or masses appreciated.  Central nervous system: Alert and oriented.  Mild left hemiparesis with left foot drop.  Extremities: Symmetric 5 x 5 power. Peripheral pulses symmetrically felt.   Skin: No rashes or acute findings.  Musculoskeletal system: Negative exam.  Psychiatry: Pleasant and cooperative.  Labs on Admission:  Basic Metabolic Panel: Recent Labs  Lab 03/17/20 1102  NA 136  K 4.2  CL 103  CO2 23  GLUCOSE 216*  BUN 16  CREATININE 1.13*  CALCIUM 9.5   Liver Function Tests: Recent Labs  Lab 03/17/20 1102  AST 20  ALT 19  ALKPHOS 48  BILITOT 0.4  PROT 7.2  ALBUMIN 4.0   No results for input(s): LIPASE, AMYLASE in the last 168 hours. No results for input(s): AMMONIA in the last 168 hours. CBC: Recent Labs  Lab 03/17/20 1102  WBC 7.9  NEUTROABS 4.1  HGB 12.9  HCT 38.8  MCV 87.6  PLT 259   Cardiac Enzymes: No results for input(s): CKTOTAL, CKMB, CKMBINDEX, TROPONINI in the last 168 hours.  BNP (last 3 results) No  results for input(s): PROBNP in the last 8760 hours. CBG: No results for input(s): GLUCAP in the last 168 hours.  Radiological Exams on Admission: MR ANGIO HEAD WO CONTRAST  Result Date: 03/17/2020 CLINICAL DATA:  Left-sided weakness EXAM: MRI HEAD WITHOUT CONTRAST MRA HEAD WITHOUT CONTRAST TECHNIQUE: Multiplanar, multiecho pulse sequences of the brain and surrounding structures were obtained without intravenous contrast. Angiographic images of the head were obtained using MRA technique without contrast. COMPARISON:  None. FINDINGS: MRI HEAD Brain: There is a 1.3 x 0.6 cm area of restricted diffusion involving the left parasagittal pons. No evidence of intracranial hemorrhage. There is no intracranial mass, mass effect, or edema. Scattered small foci of T2 hyperintensity in the supratentorial white matter are nonspecific but may reflect mild chronic microvascular ischemic changes. There is no hydrocephalus or extra-axial fluid collection. Vascular: Major vessel flow voids at the skull base are preserved. Skull and upper cervical spine: Normal marrow signal is preserved. Sinuses/Orbits: Paranasal sinuses are aerated. Orbits are unremarkable. Other: Sella is unremarkable.  Mastoid air cells are clear. MRA HEAD Intracranial internal carotid arteries are patent. Middle and anterior cerebral arteries are patent. Included intracranial vertebral arteries are patent, dominant on the left. Basilar artery is patent. Patent bilateral posterior inferior and superior cerebellar arteries. The posterior cerebral arteries are patent. Large left posterior communicating artery is present with fetal origin of the left PCA. There is no significant stenosis or aneurysm. IMPRESSION: Acute infarction involving the left parasagittal pons. No proximal intracranial vessel occlusion or significant stenosis. Mild chronic microvascular ischemic changes. These results will be called to the ordering clinician or representative by the  Radiologist Assistant, and communication documented in the PACS or [employer]. Electronically Signed  By: Macy Mis M.D.   On: 03/17/2020 10:33   MR Brain Wo Contrast  Result Date: 03/17/2020 CLINICAL DATA:  Left-sided weakness EXAM: MRI HEAD WITHOUT CONTRAST MRA HEAD WITHOUT CONTRAST TECHNIQUE: Multiplanar, multiecho pulse sequences of the brain and surrounding structures were obtained without intravenous contrast. Angiographic images of the head were obtained using MRA technique without contrast. COMPARISON:  None. FINDINGS: MRI HEAD Brain: There is a 1.3 x 0.6 cm area of restricted diffusion involving the left parasagittal pons. No evidence of intracranial hemorrhage. There is no intracranial mass, mass effect, or edema. Scattered small foci of T2 hyperintensity in the supratentorial white matter are nonspecific but may reflect mild chronic microvascular ischemic changes. There is no hydrocephalus or extra-axial fluid collection. Vascular: Major vessel flow voids at the skull base are preserved. Skull and upper cervical spine: Normal marrow signal is preserved. Sinuses/Orbits: Paranasal sinuses are aerated. Orbits are unremarkable. Other: Sella is unremarkable.  Mastoid air cells are clear. MRA HEAD Intracranial internal carotid arteries are patent. Middle and anterior cerebral arteries are patent. Included intracranial vertebral arteries are patent, dominant on the left. Basilar artery is patent. Patent bilateral posterior inferior and superior cerebellar arteries. The posterior cerebral arteries are patent. Large left posterior communicating artery is present with fetal origin of the left PCA. There is no significant stenosis or aneurysm. IMPRESSION: Acute infarction involving the left parasagittal pons. No proximal intracranial vessel occlusion or significant stenosis. Mild chronic microvascular ischemic changes. These results will be called to the ordering clinician or representative by the  Radiologist Assistant, and communication documented in the PACS or [employer]. Electronically Signed   By: Macy Mis M.D.   On: 03/17/2020 10:33    EKG: Independently reviewed.   Assessment/Plan Principal Problem:   Acute CVA (cerebrovascular accident) ([place]) Active Problems:   Type 2 diabetes mellitus with vascular disease ([place])   Hyperlipidemia   Depression   Essential hypertension   Dysphagia   History of CVA (cerebrovascular accident) without residual deficits   Chronic pain syndrome   Hyperlipidemia associated with type 2 diabetes mellitus ([place])   Left foot drop   Acute left hemiparesis ([place])   Former smoker   1. Acute CVA - Pt has MRI findings of acute infarct involving the left parasagittal pons-the patient is being admitted for stroke work-up.  The patient has been started on aspirin full dose for antiplatelet therapy.  Stroke order set bundle initiated.  PT OT evaluation requested.  Check 2D echocardiogram.  Check carotid Dopplers.  Check fasting lipid panel.  Hemoglobin A1c is 7.9% which is indicative of poorly controlled disease. 2. Type 2 diabetes mellitus, poorly controlled-patient reports that she normally takes 70 units of Levemir in the morning and 60 units of Levemir at night and occasionally takes prandial NovoLog with meals but usually only eats 1 meal per day and snacks throughout the day.  We have lowered her Levemir coverage until we can see how she is eating and we have added some prandial coverage for meals in the hospital.  Monitor CBG closely and provide supplemental sliding scale coverage as needed. 3. Dyslipidemia-patient reports she has been taking Crestor 20 mg daily for many years.  We have ordered a fasting lipid panel. 4. Depression-resume home medication. 5. Essential hypertension-allow permissive hypertension. 6. Dysphagia-SLP evaluation.  Currently not reporting any difficulty with speech or swallow.  She is able to manage oral secretions with  no difficulty at this time. 7. Chronic pain-resume home medications as needed.  DVT Prophylaxis: lovenox Code Status: Full   Family Communication:   Disposition Plan: INP   Time spent: 29 mins  Finneus Kaneshiro Wynetta Emery, MD [facility] How to contact the [facility] Attending or Consulting provider [place] or covering provider during after hours [place], for this patient?  1. Check the care team in [facility] and look for a) attending/consulting [facility] provider listed and b) the [facility] team listed 2. Log into [email or web address] and use [place]'s universal password to access. If you do not have the password, please contact the hospital operator. 3. Locate the [facility] provider you are looking for under [facility] and page to a number that you can be directly reached. 4. If you still have difficulty reaching the provider, please page the [facility] (Director on Call) for the Hospitalists listed on amion for assistance.

## 2020-03-17 NOTE — ED Triage Notes (Signed)
Pt reports feeling dizzy, left foot numbness, and unsteady gait Tuesday.  Reports was no better yesterday so she went to PCP.  Pt had outpt MRI today and was told she has had a stroke.  Pt c/o feeling "tired."  Denies any pain.  Pt says has been dragging left foot when she walks.

## 2020-03-17 NOTE — Consult Note (Signed)
[place] A. Merlene Laughter, MD     [email or web address]          Jamie Harding is an 64 y.o. female.   ASSESSMENT/PLAN: 1.  Acute ischemic stroke presenting with numbness and weakness of the left lower extremity.  Risk factors age, hypertension, diabetes and apparent history of previous stroke.  Dual antiplatelet agents are recommended for 1 month.  Subsequently, the patient should continue with Plavix and aspirin can be discontinued.  She still has some work-up pending including the echo and carotid.  This may be a cryptogenic stroke and consideration should be given to doing a 30-day cardiac event monitor.    The patient is 64 year old white female who presents with the acute onset of numbness and heaviness of the left foot in particular.  She reports having some difficulty ambulating.  She denies symptoms involve the left upper extremity.  She denies dysarthria, dysphagia or diplopia or dizziness.  She reports having no chest pain, palpitation or shortness of breath.  She has been taking aspirin 81 mg.  She was compliant with this.  The review of systems otherwise negative.    GENERAL: This is a pleasant female who is doing well at this time.  HEENT: The neck is supple no trauma noted.  ABDOMEN: soft  EXTREMITIES: No edema   BACK: Normal  SKIN: Normal by inspection.    MENTAL STATUS: Alert and oriented. Speech, language and cognition are generally intact. Judgment and insight normal.   CRANIAL NERVES: Pupils are equal, round and reactive to light and accomodation; extra ocular movements are full, there is no significant nystagmus; visual fields are full; upper and lower facial muscles are normal in strength and symmetric, there is no flattening of the nasolabial folds; tongue is midline; uvula is midline; shoulder elevation is normal.  MOTOR: Normal tone, bulk and strength; no pronator drift.  There is no drift of the upper or lower extremities.  COORDINATION:  Left finger to nose is normal, right finger to nose is normal, No rest tremor; no intention tremor; no postural tremor; no bradykinesia.  REFLEXES: Deep tendon reflexes are symmetrical and normal. Plantar reflexes are flexor bilaterally.   SENSATION: Reduced sensation to temperature and light touch involving the distal left leg and left foot.   NIH stroke scale 1.   Blood pressure 127/80, pulse 81, temperature 98.1 F (36.7 C), temperature source Oral, resp. rate 18, height 5\' 2"  (1.575 m), weight 90.1 kg, SpO2 98 %.  Past Medical History:  Diagnosis Date  . Anxiety   . Asthma   . Depression   . Essential hypertension   . Fatty liver   . Gastritis   . Gastroparesis   . GERD (gastroesophageal reflux disease)   . Hiatal hernia   . History of cardiac catheterization    Minimal coronary atherosclerosis February 2016  . History of kidney stones   . History of stroke 2008  . HLD (hyperlipidemia)   . Neuropathy   . Obesity   . Pancreatitis   . Rheumatoid arthritis(714.0)   . Stroke ([place])   . Tibialis tendinitis   . Type 2 diabetes mellitus ([place])     Past Surgical History:  Procedure Laterality Date  . ANKLE SURGERY     remove extra bone-left  . CARPAL TUNNEL RELEASE     right side  . CARPAL TUNNEL RELEASE Left 02/06/2013   Procedure: CARPAL TUNNEL RELEASE ;  Surgeon: Carole Civil, MD;  Location: [facility];  Service: Orthopedics;  Laterality: Left;  procedure end 1135  . CARPAL TUNNEL RELEASE Right 09/14/2015   Procedure: RIGHT CARPAL TUNNEL RELEASE;  Surgeon: Carole Civil, MD;  Location: [facility];  Service: Orthopedics;  Laterality: Right;  . CARPAL TUNNEL RELEASE Left 09/28/2015   Procedure: LEFT CARPAL TUNNEL RELEASE;  Surgeon: Carole Civil, MD;  Location: [facility];  Service: Orthopedics;  Laterality: Left;  procedure 1  . CESAREAN SECTION    . CHOLECYSTECTOMY    . COLONOSCOPY WITH PROPOFOL  09/16/2012   [id number] sessile polyps ranging between 3-68mm in size  were found in the sigmoid colon and rectum; polypectomy was performed/Mild diverticulosis was noted throughout the entire examined colon/Small internal hemorrhoids  . DILATATION & CURETTAGE/HYSTEROSCOPY WITH MYOSURE N/A 08/05/2019   Procedure: DILATATION & CURETTAGE/HYSTEROSCOPY WITH MYOSURE;  Surgeon: Sloan Leiter, MD;  Location: [place];  Service: Gynecology;  Laterality: N/A;  myosure rep will be here.  Confirmed on 07/30/19 CS  . DILATION AND CURETTAGE OF UTERUS     multiple  . ESOPHAGOGASTRODUODENOSCOPY  07/29/2009   Mild gastritis, benign path  . ESOPHAGOGASTRODUODENOSCOPY (EGD) WITH PROPOFOL  09/16/2012   SLF:Non-erosive gastritis (inflammation) was found; multiple bx/The duodenal mucosa showed no abnormalities in the ampulla and bulb and second portion of the duodenum/NAUSEA/VOMITING MOST LIKELY DUE TO GERD/GASTRITIS  . FINGER SURGERY Left    left little finger-otif of finger  . Gastric Emptying  07/27/2009   The amount of activity in the stomach at 120 minutes was 13% which is in the normal range  . LASER ABLATION OF THE CERVIX    . LEFT AND RIGHT HEART CATHETERIZATION WITH CORONARY ANGIOGRAM N/A 01/03/2015   Procedure: LEFT AND RIGHT HEART CATHETERIZATION WITH CORONARY ANGIOGRAM;  Surgeon: Blane Ohara, MD;  Location[facility] C[facility];  Service: Cardiovascular;  Laterality: N/A;  . LITHOTRIPSY    . POLYPECTOMY  09/16/2012   Procedure: POLYPECTOMY;  Surgeon: Danie Binder, MD;  Location: [facility];  Service: Endoscopy;  Laterality: N/A;  . SAVORY DILATION  09/16/2012   Procedure: SAVORY DILATION;  Surgeon: Danie Binder, MD;  Location: [facility];  Service: Endoscopy;  Laterality: N/A;  16 fr dilation  . TRIGGER FINGER RELEASE Left 02/06/2013   Procedure: RELEASE TRIGGER FINGER LEFT LONG FINGER/A-1 PULLEY;  Surgeon: Carole Civil, MD;  Location: [facility];  Service: Orthopedics;  Laterality: Left;  procedure began 1136  . TRIGGER FINGER RELEASE Right 09/14/2015    Procedure: RIGHT LONG FINGER TRIGGER RELEASE;  Surgeon: Carole Civil, MD;  Location: [facility];  Service: Orthopedics;  Laterality: Right;  . TRIGGER FINGER RELEASE Left 09/28/2015   Procedure: LEFT RING TRIGGER FINGER RELEASE;  Surgeon: Carole Civil, MD;  Location: [facility];  Service: Orthopedics;  Laterality: Left;  left ring finger  . TUBAL LIGATION      Family History  Problem Relation Age of Onset  . Breast cancer Mother   . Diabetes Father   . Coronary artery disease Other   . Arthritis Other   . Asthma Other   . Diabetes Sister   . Colon cancer Neg Hx     Social History:  reports that she quit smoking about 32 years ago. Her smoking use included cigarettes. She has a 1.50 pack-year smoking history. She has never used smokeless tobacco. She reports that she does not drink alcohol or use drugs.  Allergies:  Allergies  Allergen Reactions  . Byetta 10 Mcg Pen [Exenatide] Diarrhea and Nausea And Vomiting  touch of pancreatis  . Naproxen Nausea And Vomiting  . Augmentin [Amoxicillin-Pot Clavulanate]     diarrhea  . Azithromycin Nausea And Vomiting and Rash  . Erythromycin Hives       . Invokana [Canagliflozin]     Urinary issues,yeast infection  . Morphine Hives and Rash  . Sulfonamide Derivatives Nausea And Vomiting and Rash    Medications: Prior to Admission medications   Medication Sig Start Date End Date Taking? Authorizing Provider  albuterol (VENTOLIN HFA) 108 (90 Base) MCG/ACT inhaler INHALE 2 PUFFS INTO THE LUNGS EVERY 4 (FOUR) HOURS AS NEEDED FOR WHEEZING. 12/19/18  Yes Luking, Scott A, MD  amLODipine (NORVASC) 10 MG tablet Take 1 tablet (10 mg total) by mouth daily. 12/08/19  Yes Kathyrn Drown, MD  aspirin EC 81 MG tablet Take 81 mg by mouth daily.   Yes [provider]  cetirizine (ZYRTEC) 10 MG tablet TAKE ONE TABLET (10MG  TOTAL) BY MOUTH DAILY 12/15/19  Yes Luking, Scott A, MD  fluticasone (FLONASE) 50 MCG/ACT nasal spray Place 2 sprays into  both nostrils daily. 12/04/16  Yes Kathyrn Drown, MD  HYDROcodone-acetaminophen (NORCO) 7.5-325 MG tablet Take one tablet po every 6 hrs prn moderate pain 12/08/19  Yes Luking, Scott A, MD  insulin detemir (LEVEMIR) 100 UNIT/ML injection 70 units in the am and 60 units qhs 70 units in the am and 60 units qhs 12/08/19  Yes Luking, Scott A, MD  lisinopril (ZESTRIL) 20 MG tablet TAKE ONE AND ONE-HALF TABLET (30MG  TOTAL) BY MOUTH DAILY 12/08/19  Yes Kathyrn Drown, MD  meclizine (ANTIVERT) 25 MG tablet Take 1 tablet (25 mg total) by mouth 3 (three) times daily as needed for dizziness. 03/16/20  Yes Luking, Elayne Snare, MD  metFORMIN (GLUCOPHAGE) 1000 MG tablet TAKE 1 TABLET (1000 MG TOTAL) BY MOUTH 2TIMES A DAY. 12/08/19  Yes Luking, Elayne Snare, MD  Multiple Vitamin (MULTIVITAMIN WITH MINERALS) TABS Take 1 tablet by mouth daily.   Yes [provider]  mupirocin ointment (BACTROBAN) 2 % Apply 1 application topically 2 (two) times daily. 04/09/16  Yes Luking, Elayne Snare, MD  NOVOLOG FLEXPEN 100 UNIT/ML FlexPen Inject 22 Units into the skin 3 (three) times daily with meals. Patient taking differently: Inject 15-20 Units into the skin 3 (three) times daily with meals.  12/08/19  Yes Luking, Scott A, MD  pantoprazole (PROTONIX) 40 MG tablet TAKE 1 TABLET (40 MG TOTAL) BY MOUTH DAILY. 12/08/19  Yes Luking, Elayne Snare, MD  promethazine (PHENERGAN) 25 MG tablet TAKE ONE TABLET BY MOUTH EVERY EIGHT HOURS AS NEEDED FOR NAUSEA 12/04/19  Yes Luking, Elayne Snare, MD  rosuvastatin (CRESTOR) 20 MG tablet TAKE ONE TABLET BY MOUTH ONCE DAILY. 01/08/20  Yes Luking, Scott A, MD  sitaGLIPtin (JANUVIA) 100 MG tablet Take 1 tablet (100 mg total) by mouth daily. 12/08/19  Yes Kathyrn Drown, MD  triamcinolone cream (KENALOG) 0.1 % Apply BID prn 09/07/19  Yes Luking, Scott A, MD  trimethoprim (TRIMPEX) 100 MG tablet Take 1 tablet (100 mg total) by mouth at bedtime. 01/22/20  Yes McKenzie, Candee Furbish, MD  venlafaxine [facility]) 75 MG tablet Take  three tablets daily 12/08/19  Yes Luking, Elayne Snare, MD  estradiol (ESTRACE) 0.1 MG/GM vaginal cream Place [id number] Applicatorfuls vaginally at bedtime. Start with a nightly application and you can use a pea sized amount on the tip of the finger instead of the applicator.  After 2 weeks you can reduce to 2-3 x weekly. Patient  not taking: Reported on 03/17/2020 12/11/19   Irine Seal, MD  HYDROcodone-acetaminophen [facility]) 7.5-325 MG tablet Take 1 tablet by mouth every 6 (six) hours as needed for moderate pain. Patient not taking: Reported on 03/17/2020 12/08/19   Kathyrn Drown, MD  HYDROcodone-acetaminophen [facility]) 7.5-325 MG tablet Take one tablet po every 6 hrs prn moderate pain 12/08/19   Kathyrn Drown, MD  ondansetron (ZOFRAN) 8 MG tablet TAKE ONE TABLET BY MOUTH EVERY 12 HOURS AS NEEDED FOR NAUSEA 02/07/20   Kathyrn Drown, MD  [facility] injection  09/03/19   [provider]    Scheduled Meds: . [START ON 03/18/2020] aspirin  300 mg Rectal Daily   Or  . [START ON 03/18/2020] aspirin  325 mg Oral Daily  . enoxaparin (LOVENOX) injection  40 mg Subcutaneous Q24H  . fluticasone  2 spray Each Nare Daily  . insulin aspart  0-15 Units Subcutaneous TID WC  . insulin aspart  0-5 Units Subcutaneous QHS  . insulin aspart  14 Units Subcutaneous TID WC  . insulin detemir  50 Units Subcutaneous QHS  . loratadine  10 mg Oral Daily  . multivitamin with minerals  1 tablet Oral Daily  . pantoprazole  40 mg Oral Daily  . rosuvastatin  20 mg Oral QPM  . trimethoprim  100 mg Oral QHS  . venlafaxine  75 mg Oral TID WC   Continuous Infusions: . sodium chloride 35 mL/hr at 03/17/20 1800   PRN Meds:.acetaminophen **OR** acetaminophen (TYLENOL) oral liquid 160 mg/5 mL **OR** acetaminophen, albuterol, HYDROcodone-acetaminophen, meclizine, senna-docusate     Results for orders placed or performed during the hospital encounter of 03/17/20 (from the past 48 hour(s))  CBC with Differential     Status: None    Collection Time: 03/17/20 11:02 AM  Result Value Ref Range   WBC 7.9 4.0 - 10.5 K/uL   RBC 4.43 3.87 - 5.11 MIL/uL   Hemoglobin 12.9 12.0 - 15.0 g/dL   HCT 38.8 36.0 - 46.0 %   MCV 87.6 80.0 - 100.0 fL   MCH 29.1 26.0 - 34.0 pg   MCHC 33.2 30.0 - 36.0 g/dL   RDW 13.0 11.5 - 15.5 %   Platelets 259 150 - 400 K/uL   nRBC 0.0 0.0 - 0.2 %   Neutrophils Relative % 51 %   Neutro Abs 4.1 1.7 - 7.7 K/uL   Lymphocytes Relative 39 %   Lymphs Abs 3.1 0.7 - 4.0 K/uL   Monocytes Relative 6 %   Monocytes Absolute 0.4 0.1 - 1.0 K/uL   Eosinophils Relative 3 %   Eosinophils Absolute 0.2 0.0 - 0.5 K/uL   Basophils Relative 1 %   Basophils Absolute 0.0 0.0 - 0.1 K/uL   Immature Granulocytes 0 %   Abs Immature Granulocytes 0.02 0.00 - 0.07 K/uL    Comment: Performed at [facility], [address], [place], [place] [address]  Comprehensive metabolic panel     Status: Abnormal   Collection Time: 03/17/20 11:02 AM  Result Value Ref Range   Sodium 136 135 - 145 mmol/L   Potassium 4.2 3.5 - 5.1 mmol/L   Chloride 103 98 - 111 mmol/L   CO2 23 22 - 32 mmol/L   Glucose, Bld 216 (H) 70 - 99 mg/dL    Comment: Glucose reference range applies only to samples taken after fasting for at least 8 hours.   BUN 16 8 - 23 mg/dL   Creatinine, Ser 1.13 (H) 0.44 - 1.00  mg/dL   Calcium 9.5 8.9 - 10.3 mg/dL   Total Protein 7.2 6.5 - 8.1 g/dL   Albumin 4.0 3.5 - 5.0 g/dL   AST 20 15 - 41 U/L   ALT 19 0 - 44 U/L   Alkaline Phosphatase 48 38 - 126 U/L   Total Bilirubin 0.4 0.3 - 1.2 mg/dL   GFR calc non Af Amer 52 (L) >60 mL/min   GFR calc Af Amer >60 >60 mL/min   Anion gap 10 5 - 15    Comment: Performed at [facility], [address], [place], [place] [address]  Protime-INR     Status: None   Collection Time: 03/17/20 11:02 AM  Result Value Ref Range   Prothrombin Time 12.8 11.4 - 15.2 seconds   INR 1.0 0.8 - 1.2    Comment: (NOTE) INR goal varies based on device and disease states. Performed at [facility], [address], [place], [place] [address]   Respiratory Panel by RT PCR (Flu A&B, Covid) - Nasopharyngeal Swab     Status: None   Collection Time: 03/17/20 12:52 PM   Specimen: Nasopharyngeal Swab  Result Value Ref Range   SARS Coronavirus 2 by RT PCR NEGATIVE NEGATIVE    Comment: (NOTE) SARS-CoV-2 target nucleic acids are NOT DETECTED. The SARS-CoV-2 RNA is generally detectable in upper respiratoy specimens during the acute phase of infection. The lowest concentration of SARS-CoV-2 viral copies this assay can detect is 131 copies/mL. A negative result does not preclude SARS-Cov-2 infection and should not be used as the sole basis for treatment or other patient management decisions. A negative result may occur with  improper specimen collection/handling, submission of specimen other than nasopharyngeal swab, presence of viral mutation(s) within the areas targeted by this assay, and inadequate number of viral copies (<131 copies/mL). A negative result must be combined with clinical observations, patient history, and epidemiological information. The expected result is Negative. Fact Sheet for Patients:  PinkCheek.be Fact Sheet for Healthcare Providers:  GravelBags.it This test is not yet ap proved or cleared by the [place] FDA and  has been authorized for detection and/or diagnosis of SARS-CoV-2 by FDA under an Emergency Use Authorization (EUA). This EUA will remain  in effect (meaning this test can be used) for the duration of the COVID-19 declaration under Section 564(b)(1) of the Act, 21 U.S.C. section 360bbb-3(b)(1), unless the authorization is terminated or revoked sooner.    Influenza A by PCR NEGATIVE NEGATIVE   Influenza B by PCR NEGATIVE NEGATIVE    Comment: (NOTE) The Xpert Xpress SARS-CoV-2/FLU/RSV assay is intended as an aid in  the diagnosis of influenza from Nasopharyngeal swab specimens and  should  not be used as a sole basis for treatment. Nasal washings and  aspirates are unacceptable for Xpert Xpress SARS-CoV-2/FLU/RSV  testing. Fact Sheet for Patients: PinkCheek.be Fact Sheet for Healthcare Providers: GravelBags.it This test is not yet approved or cleared by the [place] FDA and  has been authorized for detection and/or diagnosis of SARS-CoV-2 by  FDA under an Emergency Use Authorization (EUA). This EUA will remain  in effect (meaning this test can be used) for the duration of the  Covid-19 declaration under Section 564(b)(1) of the Act, 21  U.S.C. section 360bbb-3(b)(1), unless the authorization is  terminated or revoked. Performed at [facility], [address], [place], [place] [address]   Glucose, capillary     Status: Abnormal   Collection Time: 03/17/20  4:16 PM  Result Value  Ref Range   Glucose-Capillary 230 (H) 70 - 99 mg/dL    Comment: Glucose reference range applies only to samples taken after fasting for at least 8 hours.    Studies/Results:  EXAM: MRI HEAD WITHOUT CONTRAST  MRA HEAD WITHOUT CONTRAST  TECHNIQUE: Multiplanar, multiecho pulse sequences of the brain and surrounding structures were obtained without intravenous contrast. Angiographic images of the head were obtained using MRA technique without contrast.  COMPARISON:  None.  FINDINGS: MRI HEAD  Brain: There is a 1.3 x 0.6 cm area of restricted diffusion involving the left parasagittal pons. No evidence of intracranial hemorrhage.  There is no intracranial mass, mass effect, or edema. Scattered small foci of T2 hyperintensity in the supratentorial white matter are nonspecific but may reflect mild chronic microvascular ischemic changes. There is no hydrocephalus or extra-axial fluid collection.  Vascular: Major vessel flow voids at the skull base are preserved.  Skull and upper cervical spine: Normal marrow signal is  preserved.  Sinuses/Orbits: Paranasal sinuses are aerated. Orbits are unremarkable.  Other: Sella is unremarkable.  Mastoid air cells are clear.  MRA HEAD  Intracranial internal carotid arteries are patent. Middle and anterior cerebral arteries are patent.  Included intracranial vertebral arteries are patent, dominant on the left. Basilar artery is patent. Patent bilateral posterior inferior and superior cerebellar arteries. The posterior cerebral arteries are patent. Large left posterior communicating artery is present with fetal origin of the left PCA.  There is no significant stenosis or aneurysm.  IMPRESSION: Acute infarction involving the left parasagittal pons. No proximal intracranial vessel occlusion or significant stenosis.  Mild chronic microvascular ischemic changes.     The brain MRI scan is reviewed in person. There is a moderate size deep white matter increased signal seen on DWI involving the atrial region/centrum semiovale on the right side. No hemorrhages appreciated. There is a definitive remote infarct involving the left pontine region. There is suggestion of the remote infarct involving the atrial on the left side with increased signal seen on FLAIR imaging. There is significant atrophy for age along with moderate deep white matter leukoencephalopathy. MRA does not show significant  Intracranial occlusive disease.    Tzivia Oneil A. Merlene Laughter, M.D.  Diplomate, Tax adviser of Psychiatry and Neurology ( Neurology). 03/17/2020, 6:51 PM

## 2020-03-17 NOTE — Progress Notes (Signed)
*  PRELIMINARY RESULTS* Echocardiogram 2D Echocardiogram has been performed.  Jamie Harding 03/17/2020, 3:25 PM

## 2020-03-17 NOTE — ED Provider Notes (Signed)
[facility] Provider Note   CSN: [id number] Arrival date & time: 03/17/20  1026     History Chief Complaint  Patient presents with  . Cerebrovascular Accident    Jamie Harding is a 64 y.o. female.  Patient with history of TIA, high blood pressure, stroke presents from outpatient MRI for new stroke found.  Patient has been feeling dizzy, unsteady gait and left foot weak and numb feeling since Tuesday.  Fairly constant symptoms.  Patient denies any previous deficits.        Past Medical History:  Diagnosis Date  . Anxiety   . Asthma   . Depression   . Essential hypertension   . Fatty liver   . Gastritis   . Gastroparesis   . GERD (gastroesophageal reflux disease)   . Hiatal hernia   . History of cardiac catheterization    Minimal coronary atherosclerosis February 2016  . History of kidney stones   . History of stroke 2008  . HLD (hyperlipidemia)   . Neuropathy   . Obesity   . Pancreatitis   . Rheumatoid arthritis(714.0)   . Stroke ([place])   . Tibialis tendinitis   . Type 2 diabetes mellitus [facility])     Patient Active Problem List   Diagnosis Date Noted  . Acute CVA (cerebrovascular accident) ([place]) 03/17/2020  . Chronic cystitis with hematuria 01/22/2020  . Nephrolithiasis 01/22/2020  . Hyperlipidemia associated with type 2 diabetes mellitus ([place]) 12/08/2019  . Endometrial polyp 08/05/2019  . Carpal tunnel syndrome of left wrist   . Trigger ring finger of left hand   . Carpal tunnel syndrome on right   . Trigger middle finger   . Morbid obesity due to excess calories ([place]) 09/05/2015  . Chronic pain syndrome 03/30/2015  . Shortness of breath   . History of CVA (cerebrovascular accident) without residual deficits 12/23/2014  . Dyspnea on exertion 11/29/2014  . Dyspareunia 09/16/2013  . Trigger finger, acquired 02/17/2013  . S/P carpal tunnel release 02/17/2013  . Lumbar pain with radiation down left leg 02/16/2013  . Greater  trochanteric bursitis 01/29/2013  . Sinus tarsi syndrome 01/29/2013  . Chest tightness   . Ejection fraction   . Dyspepsia 08/28/2012  . Encounter for screening colonoscopy 08/28/2012  . Plantar fasciitis 12/20/2011  . TRIGGER FINGER, LEFT MIDDLE 07/19/2010  . GASTROPARESIS 07/19/2009  . Type 2 diabetes mellitus with vascular disease ([place]) 07/15/2009  . Hyperlipidemia 07/15/2009  . ANXIETY 07/15/2009  . DEPRESSION 07/15/2009  . Essential hypertension 07/15/2009  . ASTHMA, UNSPECIFIED 07/15/2009  . HIATAL HERNIA 07/15/2009  . Fatty liver 07/15/2009  . DYSPHAGIA UNSPECIFIED 07/15/2009  . PANCREATITIS, HX OF 07/15/2009  . GASTRITIS, HX OF 07/15/2009  . TIBIALIS TENDINITIS 07/12/2008    Past Surgical History:  Procedure Laterality Date  . ANKLE SURGERY     remove extra bone-left  . CARPAL TUNNEL RELEASE     right side  . CARPAL TUNNEL RELEASE Left 02/06/2013   Procedure: CARPAL TUNNEL RELEASE ;  Surgeon: Carole Civil, MD;  Location: [facility];  Service: Orthopedics;  Laterality: Left;  procedure end 1135  . CARPAL TUNNEL RELEASE Right 09/14/2015   Procedure: RIGHT CARPAL TUNNEL RELEASE;  Surgeon: Carole Civil, MD;  Location: [facility];  Service: Orthopedics;  Laterality: Right;  . CARPAL TUNNEL RELEASE Left 09/28/2015   Procedure: LEFT CARPAL TUNNEL RELEASE;  Surgeon: Carole Civil, MD;  Location: [facility];  Service: Orthopedics;  Laterality: Left;  procedure 1  .  CESAREAN SECTION    . CHOLECYSTECTOMY    . COLONOSCOPY WITH PROPOFOL  09/16/2012   [id number] sessile polyps ranging between 3-54mm in size were found in the sigmoid colon and rectum; polypectomy was performed/Mild diverticulosis was noted throughout the entire examined colon/Small internal hemorrhoids  . DILATATION & CURETTAGE/HYSTEROSCOPY WITH MYOSURE N/A 08/05/2019   Procedure: DILATATION & CURETTAGE/HYSTEROSCOPY WITH MYOSURE;  Surgeon: Sloan Leiter, MD;  Location: [facility];  Service: Gynecology;   Laterality: N/A;  myosure rep will be here.  Confirmed on 07/30/19 CS  . DILATION AND CURETTAGE OF UTERUS     multiple  . ESOPHAGOGASTRODUODENOSCOPY  07/29/2009   Mild gastritis, benign path  . ESOPHAGOGASTRODUODENOSCOPY (EGD) WITH PROPOFOL  09/16/2012   SLF:Non-erosive gastritis (inflammation) was found; multiple bx/The duodenal mucosa showed no abnormalities in the ampulla and bulb and second portion of the duodenum/NAUSEA/VOMITING MOST LIKELY DUE TO GERD/GASTRITIS  . FINGER SURGERY Left    left little finger-otif of finger  . Gastric Emptying  07/27/2009   The amount of activity in the stomach at 120 minutes was 13% which is in the normal range  . LASER ABLATION OF THE CERVIX    . LEFT AND RIGHT HEART CATHETERIZATION WITH CORONARY ANGIOGRAM N/A 01/03/2015   Procedure: LEFT AND RIGHT HEART CATHETERIZATION WITH CORONARY ANGIOGRAM;  Surgeon: Blane Ohara, MD;  Location[facility];  Service: Cardiovascular;  Laterality: N/A;  . LITHOTRIPSY    . POLYPECTOMY  09/16/2012   Procedure: POLYPECTOMY;  Surgeon: Danie Binder, MD;  Location: [facility];  Service: Endoscopy;  Laterality: N/A;  . SAVORY DILATION  09/16/2012   Procedure: SAVORY DILATION;  Surgeon: Danie Binder, MD;  Location: [facility];  Service: Endoscopy;  Laterality: N/A;  16 fr dilation  . TRIGGER FINGER RELEASE Left 02/06/2013   Procedure: RELEASE TRIGGER FINGER LEFT LONG FINGER/A-1 PULLEY;  Surgeon: Carole Civil, MD;  Location: [facility];  Service: Orthopedics;  Laterality: Left;  procedure began 1136  . TRIGGER FINGER RELEASE Right 09/14/2015   Procedure: RIGHT LONG FINGER TRIGGER RELEASE;  Surgeon: Carole Civil, MD;  Location[facility];  Service: Orthopedics;  Laterality: Right;  . TRIGGER FINGER RELEASE Left 09/28/2015   Procedure: LEFT RING TRIGGER FINGER RELEASE;  Surgeon: Carole Civil, MD;  Location: [facility];  Service: Orthopedics;  Laterality: Left;  left ring finger  . TUBAL LIGATION       OB History     Gravida  7   Para  1   Term  1   Preterm      AB  6   Living  1     SAB  6   TAB      Ectopic      Multiple      Live Births  1           Family History  Problem Relation Age of Onset  . Breast cancer Mother   . Diabetes Father   . Coronary artery disease Other   . Arthritis Other   . Asthma Other   . Diabetes Sister   . Colon cancer Neg Hx     Social History   Tobacco Use  . Smoking status: Former Smoker    Packs/day: 0.50    Years: 3.00    Pack years: 1.50    Types: Cigarettes    Quit date: 09/11/1987    Years since quitting: 32.5  . Smokeless tobacco: Never Used  Substance Use Topics  .  Alcohol use: No  . Drug use: No    Home Medications Prior to Admission medications   Medication Sig Start Date End Date Taking? Authorizing Provider  albuterol (VENTOLIN HFA) 108 (90 Base) MCG/ACT inhaler INHALE 2 PUFFS INTO THE LUNGS EVERY 4 (FOUR) HOURS AS NEEDED FOR WHEEZING. 12/19/18  Yes Luking, Scott A, MD  amLODipine (NORVASC) 10 MG tablet Take 1 tablet (10 mg total) by mouth daily. 12/08/19  Yes Kathyrn Drown, MD  aspirin EC 81 MG tablet Take 81 mg by mouth daily.   Yes [provider]  cetirizine (ZYRTEC) 10 MG tablet TAKE ONE TABLET (10MG  TOTAL) BY MOUTH DAILY 12/15/19  Yes Luking, Scott A, MD  fluticasone (FLONASE) 50 MCG/ACT nasal spray Place 2 sprays into both nostrils daily. 12/04/16  Yes Kathyrn Drown, MD  HYDROcodone-acetaminophen (NORCO) 7.5-325 MG tablet Take one tablet po every 6 hrs prn moderate pain 12/08/19  Yes Luking, Scott A, MD  insulin detemir (LEVEMIR) 100 UNIT/ML injection 70 units in the am and 60 units qhs 70 units in the am and 60 units qhs 12/08/19  Yes Luking, Scott A, MD  lisinopril (ZESTRIL) 20 MG tablet TAKE ONE AND ONE-HALF TABLET (30MG  TOTAL) BY MOUTH DAILY 12/08/19  Yes Luking, Elayne Snare, MD  meclizine (ANTIVERT) 25 MG tablet Take 1 tablet (25 mg total) by mouth 3 (three) times daily as needed for dizziness. 03/16/20  Yes  Luking, Elayne Snare, MD  metFORMIN (GLUCOPHAGE) 1000 MG tablet TAKE 1 TABLET (1000 MG TOTAL) BY MOUTH 2TIMES A DAY. 12/08/19  Yes Luking, Elayne Snare, MD  Multiple Vitamin (MULTIVITAMIN WITH MINERALS) TABS Take 1 tablet by mouth daily.   Yes [provider]  mupirocin ointment (BACTROBAN) 2 % Apply 1 application topically 2 (two) times daily. 04/09/16  Yes Luking, Elayne Snare, MD  NOVOLOG FLEXPEN 100 UNIT/ML FlexPen Inject 22 Units into the skin 3 (three) times daily with meals. Patient taking differently: Inject 15-20 Units into the skin 3 (three) times daily with meals.  12/08/19  Yes Luking, Scott A, MD  pantoprazole (PROTONIX) 40 MG tablet TAKE 1 TABLET (40 MG TOTAL) BY MOUTH DAILY. 12/08/19  Yes Luking, Elayne Snare, MD  promethazine (PHENERGAN) 25 MG tablet TAKE ONE TABLET BY MOUTH EVERY EIGHT HOURS AS NEEDED FOR NAUSEA 12/04/19  Yes Luking, Elayne Snare, MD  rosuvastatin (CRESTOR) 20 MG tablet TAKE ONE TABLET BY MOUTH ONCE DAILY. 01/08/20  Yes Luking, Scott A, MD  sitaGLIPtin (JANUVIA) 100 MG tablet Take 1 tablet (100 mg total) by mouth daily. 12/08/19  Yes Kathyrn Drown, MD  triamcinolone cream (KENALOG) 0.1 % Apply BID prn 09/07/19  Yes Luking, Scott A, MD  trimethoprim (TRIMPEX) 100 MG tablet Take 1 tablet (100 mg total) by mouth at bedtime. 01/22/20  Yes McKenzie, Candee Furbish, MD  venlafaxine [facility]) 75 MG tablet Take three tablets daily 12/08/19  Yes Luking, Elayne Snare, MD  estradiol (ESTRACE) 0.1 MG/GM vaginal cream Place [id number] Applicatorfuls vaginally at bedtime. Start with a nightly application and you can use a pea sized amount on the tip of the finger instead of the applicator.  After 2 weeks you can reduce to 2-3 x weekly. Patient not taking: Reported on 03/17/2020 12/11/19   Irine Seal, MD  HYDROcodone-acetaminophen [facility]) 7.5-325 MG tablet Take 1 tablet by mouth every 6 (six) hours as needed for moderate pain. Patient not taking: Reported on 03/17/2020 12/08/19   Kathyrn Drown, MD    HYDROcodone-acetaminophen (NORCO) 7.5-325 MG tablet Take one  tablet po every 6 hrs prn moderate pain 12/08/19   Kathyrn Drown, MD  ondansetron (ZOFRAN) 8 MG tablet TAKE ONE TABLET BY MOUTH EVERY 12 HOURS AS NEEDED FOR NAUSEA 02/07/20   Kathyrn Drown, MD  [employer] injection  09/03/19   [provider]    Allergies    Byetta 10 mcg pen [exenatide], Naproxen, Augmentin [amoxicillin-pot clavulanate], Azithromycin, Erythromycin, Invokana [canagliflozin], Morphine, and Sulfonamide derivatives  Review of Systems   Review of Systems  Constitutional: Positive for fatigue. Negative for chills and fever.  HENT: Negative for congestion.   Eyes: Negative for visual disturbance.  Respiratory: Negative for shortness of breath.   Cardiovascular: Negative for chest pain.  Gastrointestinal: Negative for abdominal pain and vomiting.  Genitourinary: Negative for dysuria and flank pain.  Musculoskeletal: Negative for back pain, neck pain and neck stiffness.  Skin: Negative for rash.  Neurological: Positive for weakness and numbness. Negative for light-headedness and headaches.    Physical Exam Updated Vital Signs BP (!) 111/93   Pulse 87   Temp 98.1 F (36.7 C) (Oral)   Resp (!) 21   Ht 5\' 2"  (1.575 m)   Wt 89.4 kg   SpO2 97%   BMI 36.03 kg/m   Physical Exam Vitals and nursing note reviewed.  Constitutional:      Appearance: She is well-developed.  HENT:     Head: Normocephalic and atraumatic.  Eyes:     General:        Right eye: No discharge.        Left eye: No discharge.     Conjunctiva/sclera: Conjunctivae normal.  Neck:     Trachea: No tracheal deviation.  Cardiovascular:     Rate and Rhythm: Normal rate and regular rhythm.  Pulmonary:     Effort: Pulmonary effort is normal.     Breath sounds: Normal breath sounds.  Abdominal:     General: There is no distension.     Palpations: Abdomen is soft.     Tenderness: There is no abdominal tenderness. There is no  guarding.  Musculoskeletal:     Cervical back: Normal range of motion and neck supple.  Skin:    General: Skin is warm.     Findings: No rash.  Neurological:     Mental Status: She is alert and oriented to person, place, and time.     GCS: GCS eye subscore is 4. GCS verbal subscore is 5. GCS motor subscore is 6.     Comments: Patient has subtle weakness left lower extremity compared to right.  Patient has equal strength upper extremities bilateral.  Normal strength right side.  Sensation intact palpation all extremities however left lower extremity feels heavy.  No nystagmus.  Visual fields intact.     ED Results / Procedures / Treatments   Labs (all labs ordered are listed, but only abnormal results are displayed) Labs Reviewed  COMPREHENSIVE METABOLIC PANEL - Abnormal; Notable for the following components:      Result Value   Glucose, Bld 216 (*)    Creatinine, Ser 1.13 (*)    GFR calc non Af Amer 52 (*)    All other components within normal limits  CBC WITH DIFFERENTIAL/PLATELET  PROTIME-INR    EKG None  Radiology MR ANGIO HEAD WO CONTRAST  Result Date: 03/17/2020 CLINICAL DATA:  Left-sided weakness EXAM: MRI HEAD WITHOUT CONTRAST MRA HEAD WITHOUT CONTRAST TECHNIQUE: Multiplanar, multiecho pulse sequences of the brain and surrounding structures were obtained without intravenous contrast.  Angiographic images of the head were obtained using MRA technique without contrast. COMPARISON:  None. FINDINGS: MRI HEAD Brain: There is a 1.3 x 0.6 cm area of restricted diffusion involving the left parasagittal pons. No evidence of intracranial hemorrhage. There is no intracranial mass, mass effect, or edema. Scattered small foci of T2 hyperintensity in the supratentorial white matter are nonspecific but may reflect mild chronic microvascular ischemic changes. There is no hydrocephalus or extra-axial fluid collection. Vascular: Major vessel flow voids at the skull base are preserved. Skull and  upper cervical spine: Normal marrow signal is preserved. Sinuses/Orbits: Paranasal sinuses are aerated. Orbits are unremarkable. Other: Sella is unremarkable.  Mastoid air cells are clear. MRA HEAD Intracranial internal carotid arteries are patent. Middle and anterior cerebral arteries are patent. Included intracranial vertebral arteries are patent, dominant on the left. Basilar artery is patent. Patent bilateral posterior inferior and superior cerebellar arteries. The posterior cerebral arteries are patent. Large left posterior communicating artery is present with fetal origin of the left PCA. There is no significant stenosis or aneurysm. IMPRESSION: Acute infarction involving the left parasagittal pons. No proximal intracranial vessel occlusion or significant stenosis. Mild chronic microvascular ischemic changes. These results will be called to the ordering clinician or representative by the Radiologist Assistant, and communication documented in the PACS or [employer]. Electronically Signed   By: Macy Mis M.D.   On: 03/17/2020 10:33   MR Brain Wo Contrast  Result Date: 03/17/2020 CLINICAL DATA:  Left-sided weakness EXAM: MRI HEAD WITHOUT CONTRAST MRA HEAD WITHOUT CONTRAST TECHNIQUE: Multiplanar, multiecho pulse sequences of the brain and surrounding structures were obtained without intravenous contrast. Angiographic images of the head were obtained using MRA technique without contrast. COMPARISON:  None. FINDINGS: MRI HEAD Brain: There is a 1.3 x 0.6 cm area of restricted diffusion involving the left parasagittal pons. No evidence of intracranial hemorrhage. There is no intracranial mass, mass effect, or edema. Scattered small foci of T2 hyperintensity in the supratentorial white matter are nonspecific but may reflect mild chronic microvascular ischemic changes. There is no hydrocephalus or extra-axial fluid collection. Vascular: Major vessel flow voids at the skull base are preserved. Skull and  upper cervical spine: Normal marrow signal is preserved. Sinuses/Orbits: Paranasal sinuses are aerated. Orbits are unremarkable. Other: Sella is unremarkable.  Mastoid air cells are clear. MRA HEAD Intracranial internal carotid arteries are patent. Middle and anterior cerebral arteries are patent. Included intracranial vertebral arteries are patent, dominant on the left. Basilar artery is patent. Patent bilateral posterior inferior and superior cerebellar arteries. The posterior cerebral arteries are patent. Large left posterior communicating artery is present with fetal origin of the left PCA. There is no significant stenosis or aneurysm. IMPRESSION: Acute infarction involving the left parasagittal pons. No proximal intracranial vessel occlusion or significant stenosis. Mild chronic microvascular ischemic changes. These results will be called to the ordering clinician or representative by the Radiologist Assistant, and communication documented in the PACS or [employer]. Electronically Signed   By: Macy Mis M.D.   On: 03/17/2020 10:33    Procedures Procedures (including critical care time)  Medications Ordered in ED Medications  aspirin chewable tablet 324 mg (has no administration in time range)    ED Course  I have reviewed the triage vital signs and the nursing notes.  Pertinent labs & imaging results that were available during my care of the patient were reviewed by me and considered in my medical decision making (see chart for details).    MDM  Rules/Calculators/A&P                     Patient presents with persistent neurologic symptoms, MRI results reviewed from this morning showing acute infarction left pons.  Blood work reviewed glucose 216, creatinine 1.1.  Plan for aspirin, neuro consult and admission for further work-up of her stroke.  Repaged neurology. Discussed with Dr. Merlene Laughter who recommends observation and further work-up for stroke.  Discussed with hospitalist who  accepts patient for admission.  On reassessment no change in neuro exam.  Final Clinical Impression(s) / ED Diagnoses Final diagnoses:  Acute ischemic stroke [facility])    Rx / DC Orders ED Discharge Orders    None       Elnora Morrison, MD 03/17/20 1219

## 2020-03-18 ENCOUNTER — Other Ambulatory Visit: Payer: Self-pay

## 2020-03-18 ENCOUNTER — Inpatient Hospital Stay (HOSPITAL_COMMUNITY): Payer: PPO

## 2020-03-18 ENCOUNTER — Telehealth: Payer: Self-pay

## 2020-03-18 DIAGNOSIS — G894 Chronic pain syndrome: Secondary | ICD-10-CM | POA: Diagnosis not present

## 2020-03-18 DIAGNOSIS — I639 Cerebral infarction, unspecified: Secondary | ICD-10-CM | POA: Diagnosis not present

## 2020-03-18 DIAGNOSIS — F329 Major depressive disorder, single episode, unspecified: Secondary | ICD-10-CM | POA: Diagnosis not present

## 2020-03-18 DIAGNOSIS — I6523 Occlusion and stenosis of bilateral carotid arteries: Secondary | ICD-10-CM | POA: Diagnosis not present

## 2020-03-18 DIAGNOSIS — E785 Hyperlipidemia, unspecified: Secondary | ICD-10-CM | POA: Diagnosis not present

## 2020-03-18 DIAGNOSIS — G8194 Hemiplegia, unspecified affecting left nondominant side: Secondary | ICD-10-CM | POA: Diagnosis not present

## 2020-03-18 DIAGNOSIS — E1169 Type 2 diabetes mellitus with other specified complication: Secondary | ICD-10-CM | POA: Diagnosis not present

## 2020-03-18 DIAGNOSIS — E7849 Other hyperlipidemia: Secondary | ICD-10-CM | POA: Diagnosis not present

## 2020-03-18 DIAGNOSIS — I10 Essential (primary) hypertension: Secondary | ICD-10-CM | POA: Diagnosis not present

## 2020-03-18 DIAGNOSIS — Z8673 Personal history of transient ischemic attack (TIA), and cerebral infarction without residual deficits: Secondary | ICD-10-CM | POA: Diagnosis not present

## 2020-03-18 DIAGNOSIS — I499 Cardiac arrhythmia, unspecified: Secondary | ICD-10-CM

## 2020-03-18 DIAGNOSIS — E1159 Type 2 diabetes mellitus with other circulatory complications: Secondary | ICD-10-CM | POA: Diagnosis not present

## 2020-03-18 DIAGNOSIS — M21372 Foot drop, left foot: Secondary | ICD-10-CM | POA: Diagnosis not present

## 2020-03-18 DIAGNOSIS — Z87891 Personal history of nicotine dependence: Secondary | ICD-10-CM | POA: Diagnosis not present

## 2020-03-18 LAB — BASIC METABOLIC PANEL
Anion gap: 8 (ref 5–15)
BUN: 14 mg/dL (ref 8–23)
CO2: 24 mmol/L (ref 22–32)
Calcium: 9.5 mg/dL (ref 8.9–10.3)
Chloride: 106 mmol/L (ref 98–111)
Creatinine, Ser: 1.21 mg/dL — ABNORMAL HIGH (ref 0.44–1.00)
GFR calc Af Amer: 56 mL/min — ABNORMAL LOW (ref >60)
GFR calc non Af Amer: 48 mL/min — ABNORMAL LOW (ref >60)
Glucose, Bld: 144 mg/dL — ABNORMAL HIGH (ref 70–99)
Potassium: 4.1 mmol/L (ref 3.5–5.1)
Sodium: 138 mmol/L (ref 135–145)

## 2020-03-18 LAB — GLUCOSE, CAPILLARY
Glucose-Capillary: 140 mg/dL — ABNORMAL HIGH (ref 70–99)
Glucose-Capillary: 119 mg/dL — ABNORMAL HIGH (ref 70–99)
Glucose-Capillary: 222 mg/dL — ABNORMAL HIGH (ref 70–99)

## 2020-03-18 LAB — LIPID PANEL
Cholesterol: 141 mg/dL (ref 0–200)
HDL: 39 mg/dL — ABNORMAL LOW (ref >40)
LDL Cholesterol: 69 mg/dL (ref 0–99)
Total CHOL/HDL Ratio: 3.6 RATIO
Triglycerides: 165 mg/dL — ABNORMAL HIGH (ref <150)
VLDL: 33 mg/dL (ref 0–40)

## 2020-03-18 MED ORDER — CLOPIDOGREL BISULFATE 75 MG PO TABS: 75.0000 mg | ORAL_TABLET | Freq: Every day | ORAL | 2 refills | Status: DC

## 2020-03-18 MED ORDER — ASPIRIN EC 81 MG PO TBEC: 81.0000 mg | DELAYED_RELEASE_TABLET | Freq: Every day | ORAL | 0 refills | Status: AC

## 2020-03-18 NOTE — Care Management (Signed)
Pt seen for Code 65 deliver, lives at home with husband and adult son. Pt has good family support, no HH or DME needs. Pt has transportation to appointments and no difficulty affording or managing medications. No TOC needs at this time.

## 2020-03-18 NOTE — Progress Notes (Signed)
Patient has been given her discharge packet along with her stoke education booklet. Patient verbalized understanding of education and signs to be aware of.

## 2020-03-18 NOTE — Care Management Obs Status (Signed)
[place] NOTIFICATION   Patient Details  Name: Jamie Harding MRN: [id number] Date of Birth: 1956-01-16   Medicare Observation Status Notification Given:  Yes    Sherald Barge, RN 03/18/2020, 11:59 AM

## 2020-03-18 NOTE — Evaluation (Signed)
Physical Therapy Evaluation Patient Details Name: Jamie Harding MRN: [id number] DOB: 08-29-1956 Today's Date: 03/18/2020   History of Present Illness  Jamie Harding is a 64 y.o. female with known cerebrovascular disease status post TIA, hypertension, hyperlipidemia, coronary artery disease, gastritis, GERD, obesity, former smoker who was sent to urgency department with findings of acute CVA on MRI that was ordered by her outpatient primary care provider.  She presented to her PCP a couple of days ago complaining of dizziness and unsteady gait and weakness of the left foot that has been present for the past 3 days.  Symptoms persisted and prompted her to seek an evaluation.  She was sent for an MRI and findings of acute infarct involving the left parasagittal pons noted.  Dr. Merlene Laughter with neurology was consulted by the ED physician recommended that patient be admitted to the hospital for stroke work-up and neurology consultation.  Patient has been started on aspirin full dose for antiplatelet therapy.  The patient reports that her type 2 diabetes mellitus has been intermittently controlled and her last A1c was 7.9%.  She takes Levemir twice daily and occasionally takes NovoLog prandial coverage.  She reports eating 1 large meal per day and snacks throughout the day.  The patient denies hypoglycemia.  The patient reports that she has been taking Crestor 20 mg for several years to control her dyslipidemia.  She is being admitted for further evaluation and management.    Clinical Impression  Patient functioning at baseline for functional mobility and gait.  Plan:  Patient discharged from physical therapy to care of nursing for ambulation daily as tolerated for length of stay.     Follow Up Recommendations No PT follow up    Equipment Recommendations  None recommended by PT    Recommendations for Other Services       Precautions / Restrictions Precautions Precautions: None Restrictions Weight  Bearing Restrictions: No      Mobility  Bed Mobility Overal bed mobility: Independent                Transfers Overall transfer level: Independent Equipment used: None                Ambulation/Gait Ambulation/Gait assistance: Modified independent (Device/Increase time) Gait Distance (Feet): 200 Feet Assistive device: None Gait Pattern/deviations: WFL(Within Functional Limits) Gait velocity: slightly decreased   General Gait Details: demonstrates good return for ambulation on level, inclined and declined surfaces without loss of balance  Stairs Stairs: Yes Stairs assistance: Modified independent (Device/Increase time) Stair Management: One rail Left;Alternating pattern Number of Stairs: 3 General stair comments: Patient demonstrates good return for going up down steps in stairwell using 1 siderail without loss of balance  Wheelchair Mobility    Modified Rankin (Stroke Patients Only)       Balance Overall balance assessment: No apparent balance deficits (not formally assessed)                                           Pertinent Vitals/Pain Pain Assessment: No/denies pain    Home Living Family/patient expects to be discharged to:: Private residence Living Arrangements: Spouse/significant other Available Help at Discharge: Family Type of Home: House Home Access: Stairs to enter Entrance Stairs-Rails: None Entrance Stairs-Number of Steps: 2 Home Layout: One level Home Equipment: None      Prior Function Level of Independence: Independent  Hand Dominance   Dominant Hand: Right    Extremity/Trunk Assessment   Upper Extremity Assessment Upper Extremity Assessment: Defer to OT evaluation    Lower Extremity Assessment Lower Extremity Assessment: Overall WFL for tasks assessed    Cervical / Trunk Assessment Cervical / Trunk Assessment: Normal  Communication   Communication: No difficulties  Cognition  Arousal/Alertness: Awake/alert Behavior During Therapy: WFL for tasks assessed/performed Overall Cognitive Status: Within Functional Limits for tasks assessed                                        General Comments      Exercises     Assessment/Plan    PT Assessment Patent does not need any further PT services  PT Problem List         PT Treatment Interventions      PT Goals (Current goals can be found in the Care Plan section)  Acute Rehab PT Goals Patient Stated Goal: return home PT Goal Formulation: With patient Time For Goal Achievement: 03/18/20 Potential to Achieve Goals: Good    Frequency     Barriers to discharge        Co-evaluation               AM-PAC PT "6 Clicks" Mobility  Outcome Measure Help needed turning from your back to your side while in a flat bed without using bedrails?: None Help needed moving from lying on your back to sitting on the side of a flat bed without using bedrails?: None Help needed moving to and from a bed to a chair (including a wheelchair)?: None Help needed standing up from a chair using your arms (e.g., wheelchair or bedside chair)?: None Help needed to walk in hospital room?: None Help needed climbing 3-5 steps with a railing? : None 6 Click Score: 24    End of Session   Activity Tolerance: Patient tolerated treatment well Patient left: in bed;with call bell/phone within reach Nurse Communication: Mobility status PT Visit Diagnosis: Unsteadiness on feet (R26.81);Other abnormalities of gait and mobility (R26.89);Muscle weakness (generalized) (M62.81)    Time: [id number] PT Time Calculation (min) (ACUTE ONLY): 17 min   Charges:   PT Evaluation $PT Eval Low Complexity: 1 Low PT Treatments $Therapeutic Activity: 8-22 mins        9:27 AM, 03/18/20 Lonell Grandchild, MPT Physical Therapist with [facility] 336 [phone] office [phone] mobile phone

## 2020-03-18 NOTE — Evaluation (Signed)
Occupational Therapy Evaluation Patient Details Name: Jamie Harding MRN: [id number] DOB: [birth year] Today's Date: 03/18/2020    History of Present Illness Jamie Harding is a 64 y.o. female with known cerebrovascular disease status post TIA, hypertension, hyperlipidemia, coronary artery disease, gastritis, GERD, obesity, former smoker who was sent to urgency department with findings of acute CVA on MRI that was ordered by her outpatient primary care provider.  She presented to her PCP a couple of days ago complaining of dizziness and unsteady gait and weakness of the left foot that has been present for the past 3 days.  Symptoms persisted and prompted her to seek an evaluation.  She was sent for an MRI and findings of acute infarct involving the left parasagittal pons noted.  Dr. Merlene Laughter with neurology was consulted by the ED physician recommended that patient be admitted to the hospital for stroke work-up and neurology consultation.  Patient has been started on aspirin full dose for antiplatelet therapy.  The patient reports that her type 2 diabetes mellitus has been intermittently controlled and her last A1c was 7.9%.  She takes Levemir twice daily and occasionally takes NovoLog prandial coverage.  She reports eating 1 large meal per day and snacks throughout the day.  The patient denies hypoglycemia.  The patient reports that she has been taking Crestor 20 mg for several years to control her dyslipidemia.  She is being admitted for further evaluation and management.   Clinical Impression   Pt agreeable to OT evaluation this am. Pt demonstrates independence in ADLs and functional mobility tasks. Pt reports improvement in LLE numbness, now with minimal tingling on arch of left foot. Pt is at baseline, no further OT services required at this time.      Follow Up Recommendations  No OT follow up    Equipment Recommendations  None recommended by OT       Precautions / Restrictions  Precautions Precautions: None Restrictions Weight Bearing Restrictions: No      Mobility Bed Mobility Overal bed mobility: Independent                Transfers Overall transfer level: Independent Equipment used: None                      ADL either performed or assessed with clinical judgement   ADL Overall ADL's : Independent                                             Vision Baseline Vision/History: Wears glasses Wears Glasses: At all times Patient Visual Report: No change from baseline Vision Assessment?: No apparent visual deficits            Pertinent Vitals/Pain Pain Assessment: No/denies pain     Hand Dominance Right   Extremity/Trunk Assessment Upper Extremity Assessment Upper Extremity Assessment: Overall WFL for tasks assessed   Lower Extremity Assessment Lower Extremity Assessment: Defer to PT evaluation   Cervical / Trunk Assessment Cervical / Trunk Assessment: Normal   Communication Communication Communication: No difficulties   Cognition Arousal/Alertness: Awake/alert Behavior During Therapy: WFL for tasks assessed/performed Overall Cognitive Status: Within Functional Limits for tasks assessed  Home Living Family/patient expects to be discharged to:: Private residence Living Arrangements: Spouse/significant other Available Help at Discharge: Family Type of Home: House Home Access: Stairs to enter Technical brewer of Steps: 2 Entrance Stairs-Rails: None Home Layout: One level     Bathroom Shower/Tub: Occupational psychologist: [place]: None          Prior Functioning/Environment Level of Independence: Independent                     AM-PAC OT "6 Clicks" Daily Activity     Outcome Measure Help from another person eating meals?: None Help from another person taking care of personal  grooming?: None Help from another person toileting, which includes using toliet, bedpan, or urinal?: None Help from another person bathing (including washing, rinsing, drying)?: None Help from another person to put on and taking off regular upper body clothing?: None Help from another person to put on and taking off regular lower body clothing?: None 6 Click Score: 24   End of Session    Activity Tolerance: Patient tolerated treatment well Patient left: in bed;with call bell/phone within reach  OT Visit Diagnosis: Muscle weakness (generalized) (M62.81)                Time: [id number] OT Time Calculation (min): 10 min Charges:  OT General Charges $OT Visit: 1 Visit OT Evaluation $OT Eval Low Complexity: [place], OTR/L  [phone] 03/18/2020, 8:27 AM

## 2020-03-18 NOTE — Care Management CC44 (Signed)
Condition Code 44 Documentation Completed  Patient Details  Name: ILLONA GOUGE MRN: [id number] Date of Birth: 12/08/1955   Condition Code 44 given:  Yes Patient signature on Condition Code 44 notice:  Yes Documentation of 2 MD's agreement:  Yes Code 44 added to claim:  Yes    Sherald Barge, RN 03/18/2020, 11:59 AM

## 2020-03-18 NOTE — Telephone Encounter (Signed)
[employer] manager received call from hospitalist, Dr.Johnson stating patient needs a 30 day event monitor for CVA, order placed, ref entered to be delivered to patients home

## 2020-03-18 NOTE — Discharge Summary (Addendum)
Physician Discharge Summary  Jamie Harding [id number] DOB: July 20, 1956 DOA: 03/17/2020  PCP: Jamie Drown, MD Neurologist: Jamie Harding  Admit date: 03/17/2020 Discharge date: 03/18/2020  Admitted From:  HOME  Disposition:  HOME   Recommendations for Outpatient Follow-up:  1. Follow up with PCP in 1 weeks 2. Follow up with neurologist in 3-4 weeks. 3. 30 day event cardiac monitor will be sent to patient's home as part of stroke work up 4. Please follow up on final carotid doppler results.    Home Health:  NONE   Discharge Condition: STABLE   CODE STATUS: FULL    Brief Hospitalization Summary: Please see all hospital notes, images, labs for full details of the hospitalization. ADMISSION HPI: Jamie Harding is a 64 y.o. female with known cerebrovascular disease status post TIA, hypertension, hyperlipidemia, coronary artery disease, gastritis, GERD, obesity, former smoker who was sent to urgency department with findings of acute CVA on MRI that was ordered by her outpatient primary care provider.  She presented to her PCP a couple of days ago complaining of dizziness and unsteady gait and weakness of the left foot that has been present for the past 3 days.  Symptoms persisted and prompted her to seek an evaluation.  She was sent for an MRI and findings of acute infarct involving the left parasagittal pons noted.  Dr. Merlene Harding with neurology was consulted by the ED physician recommended that patient be admitted to the hospital for stroke work-up and neurology consultation.  Patient has been started on aspirin full dose for antiplatelet therapy.  The patient reports that her type 2 diabetes mellitus has been intermittently controlled and her last A1c was 7.9%.  She takes Levemir twice daily and occasionally takes NovoLog prandial coverage.  She reports eating 1 large meal per day and snacks throughout the day.  The patient denies hypoglycemia.  The patient reports that she has been taking Crestor  20 mg for several years to control her dyslipidemia.  She is being admitted for further evaluation and management.  Pt was admitted for acute CVA work up.  Pt was seen by neurologist and he has recommended 30 day event monitor which is being arranged. She will need to be on dual antiplatelet therapy for 30 days.  DC aspirin after 30 days and continue plavix daily afterwards. Pt will continue crestor and blood pressure medications.  Diabetes management is very important and patient strongly advised to follow up with PCP for assistance with DM management.  Pt was seen by PT/OT but no needs identified.  Her Stroke symptoms are nearly completely resolved now.  She is stable to discharge home.  Follow up with Dr. Merlene Harding in 3 to 4 weeks.  Follow up with PCP in 1-2 weeks.     2D Echocardiogram 03/17/20 ECHOCARDIOGRAM REPORT    Patient Name:  Jamie Harding Date of Exam: 03/17/2020  Medical Rec #: [id number]    Height:    62.0 in  Accession #:  [id number]   Weight:    197.0 lb  Date of Birth: [birth year]    BSA:     1.899 m  Patient Age:  88 years    BP:      121/71 mmHg  Patient Gender: F        HR:      82 bpm.  Exam Location: [name]   Procedure: 2D Echo   Indications:  Stroke 434.91 / I163.9    History:    Patient has prior  history of Echocardiogram examinations,  most         recent 12/31/2014. Stroke, Signs/Symptoms:Dyspnea; Risk         Factors:Former Smoker, Dyslipidemia and Hypertension.    Sonographer:  Leavy Cella RDCS (AE)  Referring Phys: [place]   1. Left ventricular ejection fraction, by estimation, is 60 to 65%. The  left ventricle has normal function. The left ventricle has no regional  wall motion abnormalities. There is moderate left ventricular hypertrophy.  Left ventricular diastolic  parameters are consistent with Grade I diastolic dysfunction (impaired   relaxation).  2. Right ventricular systolic function is normal. The right ventricular  size is normal.  3. The mitral valve is normal in structure. No evidence of mitral valve  regurgitation. No evidence of mitral stenosis.  4. The aortic valve is tricuspid. Aortic valve regurgitation is not  visualized. No aortic stenosis is present.  5. The inferior vena cava is normal in size with greater than 50%  respiratory variability, suggesting right atrial pressure of 3 mmHg.    Discharge Diagnoses:  Principal Problem:   Acute CVA (cerebrovascular accident) ([place]) Active Problems:   Type 2 diabetes mellitus with vascular disease ([place])   Hyperlipidemia   Depression   Essential hypertension   Dysphagia   History of CVA (cerebrovascular accident) without residual deficits   Chronic pain syndrome   Hyperlipidemia associated with type 2 diabetes mellitus ([place])   Left foot drop   Acute left hemiparesis ([place])   Former smoker   Discharge Instructions:  Allergies as of 03/18/2020      Reactions   Byetta 10 Mcg Pen [exenatide] Diarrhea, Nausea And Vomiting     touch of pancreatis   Naproxen Nausea And Vomiting   Augmentin [amoxicillin-pot Clavulanate]    diarrhea   Azithromycin Nausea And Vomiting, Rash   Erythromycin Hives       Invokana [canagliflozin]    Urinary issues,yeast infection   Morphine Hives, Rash   Sulfonamide Derivatives Nausea And Vomiting, Rash      Medication List    STOP taking these medications   estradiol 0.1 MG/GM vaginal cream Commonly known as: ESTRACE     TAKE these medications   albuterol 108 (90 Base) MCG/ACT inhaler Commonly known as: Ventolin HFA INHALE 2 PUFFS INTO THE LUNGS EVERY 4 (FOUR) HOURS AS NEEDED FOR WHEEZING.   amLODipine 10 MG tablet Commonly known as: NORVASC Take 1 tablet (10 mg total) by mouth daily.   aspirin EC 81 MG tablet Take 1 tablet (81 mg total) by mouth daily.   cetirizine 10 MG tablet Commonly known as:  ZYRTEC TAKE ONE TABLET (10MG  TOTAL) BY MOUTH DAILY   clopidogrel 75 MG tablet Commonly known as: PLAVIX Take 1 tablet (75 mg total) by mouth daily with breakfast. Start taking on: Mar 19, 2020   fluticasone 50 MCG/ACT nasal spray Commonly known as: FLONASE Place 2 sprays into both nostrils daily.   HYDROcodone-acetaminophen 7.5-325 MG tablet Commonly known as: NORCO Take one tablet po every 6 hrs prn moderate pain What changed: Another medication with the same name was removed. Continue taking this medication, and follow the directions you see here.   insulin detemir 100 UNIT/ML injection Commonly known as: LEVEMIR 70 units in the am and 60 units qhs 70 units in the am and 60 units qhs   lisinopril 20 MG tablet Commonly known as: ZESTRIL TAKE ONE AND ONE-HALF TABLET (30MG  TOTAL) BY MOUTH DAILY   meclizine  25 MG tablet Commonly known as: ANTIVERT Take 1 tablet (25 mg total) by mouth 3 (three) times daily as needed for dizziness.   metFORMIN 1000 MG tablet Commonly known as: GLUCOPHAGE TAKE 1 TABLET (1000 MG TOTAL) BY MOUTH 2TIMES A DAY.   multivitamin with minerals Tabs tablet Take 1 tablet by mouth daily.   mupirocin ointment 2 % Commonly known as: Bactroban Apply 1 application topically 2 (two) times daily.   NovoLOG FlexPen 100 UNIT/ML FlexPen Generic drug: insulin aspart Inject 22 Units into the skin 3 (three) times daily with meals. What changed: how much to take   ondansetron 8 MG tablet Commonly known as: ZOFRAN TAKE ONE TABLET BY MOUTH EVERY 12 HOURS AS NEEDED FOR NAUSEA   pantoprazole 40 MG tablet Commonly known as: PROTONIX TAKE 1 TABLET (40 MG TOTAL) BY MOUTH DAILY.   promethazine 25 MG tablet Commonly known as: PHENERGAN TAKE ONE TABLET BY MOUTH EVERY EIGHT HOURS AS NEEDED FOR NAUSEA   rosuvastatin 20 MG tablet Commonly known as: CRESTOR TAKE ONE TABLET BY MOUTH ONCE DAILY.   Shingrix injection Generic drug: Zoster Vaccine Adjuvanted    sitaGLIPtin 100 MG tablet Commonly known as: Januvia Take 1 tablet (100 mg total) by mouth daily.   triamcinolone cream 0.1 % Commonly known as: KENALOG Apply BID prn   trimethoprim 100 MG tablet Commonly known as: TRIMPEX Take 1 tablet (100 mg total) by mouth at bedtime.   venlafaxine 75 MG tablet Commonly known as: EFFEXOR Take three tablets daily      Follow-up Information    Luking, Elayne Snare, MD. Schedule an appointment as soon as possible for a visit in 1 week(s).   Specialty: Family Medicine Contact information: [place] [address] [phone]        Phillips Odor, MD. Schedule an appointment as soon as possible for a visit in 3 week(s).   Specialty: Neurology Why: Stroke Follow Up  Contact information: [address] [place] [address] [phone]          Allergies  Allergen Reactions  . Byetta 10 Mcg Pen [Exenatide] Diarrhea and Nausea And Vomiting      touch of pancreatis  . Naproxen Nausea And Vomiting  . Augmentin [Amoxicillin-Pot Clavulanate]     diarrhea  . Azithromycin Nausea And Vomiting and Rash  . Erythromycin Hives       . Invokana [Canagliflozin]     Urinary issues,yeast infection  . Morphine Hives and Rash  . Sulfonamide Derivatives Nausea And Vomiting and Rash   Allergies as of 03/18/2020      Reactions   Byetta 10 Mcg Pen [exenatide] Diarrhea, Nausea And Vomiting     touch of pancreatis   Naproxen Nausea And Vomiting   Augmentin [amoxicillin-pot Clavulanate]    diarrhea   Azithromycin Nausea And Vomiting, Rash   Erythromycin Hives       Invokana [canagliflozin]    Urinary issues,yeast infection   Morphine Hives, Rash   Sulfonamide Derivatives Nausea And Vomiting, Rash      Medication List    STOP taking these medications   estradiol 0.1 MG/GM vaginal cream Commonly known as: ESTRACE     TAKE these medications   albuterol 108 (90 Base) MCG/ACT inhaler Commonly known as: Ventolin  HFA INHALE 2 PUFFS INTO THE LUNGS EVERY 4 (FOUR) HOURS AS NEEDED FOR WHEEZING.   amLODipine 10 MG tablet Commonly known as: NORVASC Take 1 tablet (10 mg total) by mouth daily.  aspirin EC 81 MG tablet Take 1 tablet (81 mg total) by mouth daily.   cetirizine 10 MG tablet Commonly known as: ZYRTEC TAKE ONE TABLET (10MG  TOTAL) BY MOUTH DAILY   clopidogrel 75 MG tablet Commonly known as: PLAVIX Take 1 tablet (75 mg total) by mouth daily with breakfast. Start taking on: Mar 19, 2020   fluticasone 50 MCG/ACT nasal spray Commonly known as: FLONASE Place 2 sprays into both nostrils daily.   HYDROcodone-acetaminophen 7.5-325 MG tablet Commonly known as: NORCO Take one tablet po every 6 hrs prn moderate pain What changed: Another medication with the same name was removed. Continue taking this medication, and follow the directions you see here.   insulin detemir 100 UNIT/ML injection Commonly known as: LEVEMIR 70 units in the am and 60 units qhs 70 units in the am and 60 units qhs   lisinopril 20 MG tablet Commonly known as: ZESTRIL TAKE ONE AND ONE-HALF TABLET (30MG  TOTAL) BY MOUTH DAILY   meclizine 25 MG tablet Commonly known as: ANTIVERT Take 1 tablet (25 mg total) by mouth 3 (three) times daily as needed for dizziness.   metFORMIN 1000 MG tablet Commonly known as: GLUCOPHAGE TAKE 1 TABLET (1000 MG TOTAL) BY MOUTH 2TIMES A DAY.   multivitamin with minerals Tabs tablet Take 1 tablet by mouth daily.   mupirocin ointment 2 % Commonly known as: Bactroban Apply 1 application topically 2 (two) times daily.   NovoLOG FlexPen 100 UNIT/ML FlexPen Generic drug: insulin aspart Inject 22 Units into the skin 3 (three) times daily with meals. What changed: how much to take   ondansetron 8 MG tablet Commonly known as: ZOFRAN TAKE ONE TABLET BY MOUTH EVERY 12 HOURS AS NEEDED FOR NAUSEA   pantoprazole 40 MG tablet Commonly known as: PROTONIX TAKE 1 TABLET (40 MG TOTAL) BY MOUTH  DAILY.   promethazine 25 MG tablet Commonly known as: PHENERGAN TAKE ONE TABLET BY MOUTH EVERY EIGHT HOURS AS NEEDED FOR NAUSEA   rosuvastatin 20 MG tablet Commonly known as: CRESTOR TAKE ONE TABLET BY MOUTH ONCE DAILY.   Shingrix injection Generic drug: Zoster Vaccine Adjuvanted   sitaGLIPtin 100 MG tablet Commonly known as: Januvia Take 1 tablet (100 mg total) by mouth daily.   triamcinolone cream 0.1 % Commonly known as: KENALOG Apply BID prn   trimethoprim 100 MG tablet Commonly known as: TRIMPEX Take 1 tablet (100 mg total) by mouth at bedtime.   venlafaxine 75 MG tablet Commonly known as: EFFEXOR Take three tablets daily      Procedures/Studies: MR ANGIO HEAD WO CONTRAST  Result Date: 03/17/2020 CLINICAL DATA:  Left-sided weakness EXAM: MRI HEAD WITHOUT CONTRAST MRA HEAD WITHOUT CONTRAST TECHNIQUE: Multiplanar, multiecho pulse sequences of the brain and surrounding structures were obtained without intravenous contrast. Angiographic images of the head were obtained using MRA technique without contrast. COMPARISON:  None. FINDINGS: MRI HEAD Brain: There is a 1.3 x 0.6 cm area of restricted diffusion involving the left parasagittal pons. No evidence of intracranial hemorrhage. There is no intracranial mass, mass effect, or edema. Scattered small foci of T2 hyperintensity in the supratentorial white matter are nonspecific but may reflect mild chronic microvascular ischemic changes. There is no hydrocephalus or extra-axial fluid collection. Vascular: Major vessel flow voids at the skull base are preserved. Skull and upper cervical spine: Normal marrow signal is preserved. Sinuses/Orbits: Paranasal sinuses are aerated. Orbits are unremarkable. Other: Sella is unremarkable.  Mastoid air cells are clear. MRA HEAD Intracranial internal carotid arteries are patent.  Middle and anterior cerebral arteries are patent. Included intracranial vertebral arteries are patent, dominant on the  left. Basilar artery is patent. Patent bilateral posterior inferior and superior cerebellar arteries. The posterior cerebral arteries are patent. Large left posterior communicating artery is present with fetal origin of the left PCA. There is no significant stenosis or aneurysm. IMPRESSION: Acute infarction involving the left parasagittal pons. No proximal intracranial vessel occlusion or significant stenosis. Mild chronic microvascular ischemic changes. These results will be called to the ordering clinician or representative by the Radiologist Assistant, and communication documented in the PACS or [employer]. Electronically Signed   By: Macy Mis M.D.   On: 03/17/2020 10:33   MR Brain Wo Contrast  Result Date: 03/17/2020 CLINICAL DATA:  Left-sided weakness EXAM: MRI HEAD WITHOUT CONTRAST MRA HEAD WITHOUT CONTRAST TECHNIQUE: Multiplanar, multiecho pulse sequences of the brain and surrounding structures were obtained without intravenous contrast. Angiographic images of the head were obtained using MRA technique without contrast. COMPARISON:  None. FINDINGS: MRI HEAD Brain: There is a 1.3 x 0.6 cm area of restricted diffusion involving the left parasagittal pons. No evidence of intracranial hemorrhage. There is no intracranial mass, mass effect, or edema. Scattered small foci of T2 hyperintensity in the supratentorial white matter are nonspecific but may reflect mild chronic microvascular ischemic changes. There is no hydrocephalus or extra-axial fluid collection. Vascular: Major vessel flow voids at the skull base are preserved. Skull and upper cervical spine: Normal marrow signal is preserved. Sinuses/Orbits: Paranasal sinuses are aerated. Orbits are unremarkable. Other: Sella is unremarkable.  Mastoid air cells are clear. MRA HEAD Intracranial internal carotid arteries are patent. Middle and anterior cerebral arteries are patent. Included intracranial vertebral arteries are patent, dominant on the  left. Basilar artery is patent. Patent bilateral posterior inferior and superior cerebellar arteries. The posterior cerebral arteries are patent. Large left posterior communicating artery is present with fetal origin of the left PCA. There is no significant stenosis or aneurysm. IMPRESSION: Acute infarction involving the left parasagittal pons. No proximal intracranial vessel occlusion or significant stenosis. Mild chronic microvascular ischemic changes. These results will be called to the ordering clinician or representative by the Radiologist Assistant, and communication documented in the PACS or [employer]. Electronically Signed   By: Macy Mis M.D.   On: 03/17/2020 10:33   ECHOCARDIOGRAM COMPLETE  Result Date: 03/17/2020    ECHOCARDIOGRAM REPORT   Patient Name:   SHANIAH GHOSH Date of Exam: 03/17/2020 Medical Rec #:  [id number]       Height:       62.0 in Accession #:    [id number]      Weight:       197.0 lb Date of Birth:  1956-05-15        BSA:          1.899 m Patient Age:    74 years        BP:           121/71 mmHg Patient Gender: F               HR:           82 bpm. Exam Location:  [name] Procedure: 2D Echo Indications:    Stroke 434.91 / I163.9  History:        Patient has prior history of Echocardiogram examinations, most                 recent 12/31/2014. Stroke, Signs/Symptoms:Dyspnea; Risk  Factors:Former Smoker, Dyslipidemia and Hypertension.  Sonographer:    Leavy Cella RDCS (AE) Referring Phys: [place]  1. Left ventricular ejection fraction, by estimation, is 60 to 65%. The left ventricle has normal function. The left ventricle has no regional wall motion abnormalities. There is moderate left ventricular hypertrophy. Left ventricular diastolic parameters are consistent with Grade I diastolic dysfunction (impaired relaxation).  2. Right ventricular systolic function is normal. The right ventricular size is normal.  3. The mitral valve  is normal in structure. No evidence of mitral valve regurgitation. No evidence of mitral stenosis.  4. The aortic valve is tricuspid. Aortic valve regurgitation is not visualized. No aortic stenosis is present.  5. The inferior vena cava is normal in size with greater than 50% respiratory variability, suggesting right atrial pressure of 3 mmHg. FINDINGS  Left Ventricle: Left ventricular ejection fraction, by estimation, is 60 to 65%. The left ventricle has normal function. The left ventricle has no regional wall motion abnormalities. The left ventricular internal cavity size was normal in size. There is  moderate left ventricular hypertrophy. Left ventricular diastolic parameters are consistent with Grade I diastolic dysfunction (impaired relaxation). Right Ventricle: The right ventricular size is normal. No increase in right ventricular wall thickness. Right ventricular systolic function is normal. Left Atrium: Left atrial size was normal in size. Right Atrium: Right atrial size was normal in size. Pericardium: There is no evidence of pericardial effusion. Mitral Valve: The mitral valve is normal in structure. Mild mitral annular calcification. No evidence of mitral valve regurgitation. No evidence of mitral valve stenosis. Tricuspid Valve: The tricuspid valve is normal in structure. Tricuspid valve regurgitation is not demonstrated. No evidence of tricuspid stenosis. Aortic Valve: The aortic valve is tricuspid. Aortic valve regurgitation is not visualized. No aortic stenosis is present. Mild aortic valve annular calcification. Aortic valve mean gradient measures 6.4 mmHg. Aortic valve peak gradient measures 12.8 mmHg. Aortic valve area, by VTI measures 2.41 cm. Pulmonic Valve: The pulmonic valve was not well visualized. Pulmonic valve regurgitation is not visualized. No evidence of pulmonic stenosis. Aorta: The aortic root is normal in size and structure. Pulmonary Artery: Indeterminant PASP, inadequate TR jet.  Venous: The inferior vena cava is normal in size with greater than 50% respiratory variability, suggesting right atrial pressure of 3 mmHg. IAS/Shunts: No atrial level shunt detected by color flow Doppler.  LEFT VENTRICLE PLAX 2D LVIDd:         3.09 cm  Diastology LVIDs:         2.08 cm  LV e' lateral:   9.20 cm/s LV PW:         1.35 cm  LV E/e' lateral: 9.1 LV IVS:        1.38 cm  LV e' medial:    8.70 cm/s LVOT diam:     1.80 cm  LV E/e' medial:  9.6 LV SV:         76 LV SV Index:   40 LVOT Area:     2.54 cm  RIGHT VENTRICLE RV S prime:     14.30 cm/s TAPSE (M-mode): 2.0 cm LEFT ATRIUM           Index LA diam:      2.90 cm 1.53 cm/m LA Vol (A4C): 38.7 ml 20.37 ml/m  AORTIC VALVE AV Area (Vmax):    2.18 cm AV Area (Vmean):   2.23 cm AV Area (VTI):     2.41 cm AV Vmax:  179.17 cm/s AV Vmean:          121.329 cm/s AV VTI:            0.316 m AV Peak Grad:      12.8 mmHg AV Mean Grad:      6.4 mmHg LVOT Vmax:         153.57 cm/s LVOT Vmean:        106.294 cm/s LVOT VTI:          0.299 m LVOT/AV VTI ratio: 0.95  AORTA Ao Root diam: 2.80 cm MITRAL VALVE MV Area (PHT): 2.26 cm     SHUNTS MV Decel Time: 335 msec     Systemic VTI:  0.30 m MV E velocity: 83.60 cm/s   Systemic Diam: 1.80 cm MV A velocity: 107.00 cm/s MV E/A ratio:  0.78 Carlyle Dolly MD Electronically signed by Carlyle Dolly MD Signature Date/Time: 03/17/2020/3:30:24 PM    Final       Subjective: Pt says she is feeling much better.  No weakness.  No difficulty with speech and left leg symptoms are much improved.    Discharge Exam: Vitals:   03/18/20 0238 03/18/20 0421  BP: 102/87 96/62  Pulse: 98 70  Resp: 17 16  Temp: 98.4 F (36.9 C) 97.7 F (36.5 C)  SpO2: 98% 95%   Vitals:   03/18/20 0005 03/18/20 0006 03/18/20 0238 03/18/20 0421  BP: 118/60 105/69 102/87 96/62  Pulse: 89 (!) 110 98 70  Resp: 16 16 17 16   Temp:   98.4 F (36.9 C) 97.7 F (36.5 C)  TempSrc:   Oral   SpO2: 98% 100% 98% 95%  Weight:       Height:       General: Pt is alert, awake, not in acute distress Cardiovascular: RRR, S1/S2 +, no rubs, no gallops Respiratory: CTA bilaterally, no wheezing, no rhonchi Abdominal: Soft, NT, ND, bowel sounds + Extremities: no edema, no cyanosis Neurological: left hemiparesis nearly completely resolved now.    The results of significant diagnostics from this hospitalization (including imaging, microbiology, ancillary and laboratory) are listed below for reference.     Microbiology: Recent Results (from the past 240 hour(s))  Respiratory Panel by RT PCR (Flu A&B, Covid) - Nasopharyngeal Swab     Status: None   Collection Time: 03/17/20 12:52 PM   Specimen: Nasopharyngeal Swab  Result Value Ref Range Status   SARS Coronavirus 2 by RT PCR NEGATIVE NEGATIVE Final    Comment: (NOTE) SARS-CoV-2 target nucleic acids are NOT DETECTED. The SARS-CoV-2 RNA is generally detectable in upper respiratoy specimens during the acute phase of infection. The lowest concentration of SARS-CoV-2 viral copies this assay can detect is 131 copies/mL. A negative result does not preclude SARS-Cov-2 infection and should not be used as the sole basis for treatment or other patient management decisions. A negative result may occur with  improper specimen collection/handling, submission of specimen other than nasopharyngeal swab, presence of viral mutation(s) within the areas targeted by this assay, and inadequate number of viral copies (<131 copies/mL). A negative result must be combined with clinical observations, patient history, and epidemiological information. The expected result is Negative. Fact Sheet for Patients:  PinkCheek.be Fact Sheet for Healthcare Providers:  GravelBags.it This test is not yet ap proved or cleared by the [place] FDA and  has been authorized for detection and/or diagnosis of SARS-CoV-2 by FDA under an Emergency Use  Authorization (EUA). This EUA will remain  in effect (meaning this  test can be used) for the duration of the COVID-19 declaration under Section 564(b)(1) of the Act, 21 U.S.C. section 360bbb-3(b)(1), unless the authorization is terminated or revoked sooner.    Influenza A by PCR NEGATIVE NEGATIVE Final   Influenza B by PCR NEGATIVE NEGATIVE Final    Comment: (NOTE) The Xpert Xpress SARS-CoV-2/FLU/RSV assay is intended as an aid in  the diagnosis of influenza from Nasopharyngeal swab specimens and  should not be used as a sole basis for treatment. Nasal washings and  aspirates are unacceptable for Xpert Xpress SARS-CoV-2/FLU/RSV  testing. Fact Sheet for Patients: PinkCheek.be Fact Sheet for Healthcare Providers: GravelBags.it This test is not yet approved or cleared by the [place] FDA and  has been authorized for detection and/or diagnosis of SARS-CoV-2 by  FDA under an Emergency Use Authorization (EUA). This EUA will remain  in effect (meaning this test can be used) for the duration of the  Covid-19 declaration under Section 564(b)(1) of the Act, 21  U.S.C. section 360bbb-3(b)(1), unless the authorization is  terminated or revoked. Performed at [facility], [address], [place] [place], [place] [id number]      Labs: BNP (last 3 results) No results for input(s): BNP in the last 8760 hours. Basic Metabolic Panel: Recent Labs  Lab 03/17/20 1102 03/18/20 0549  NA 136 138  K 4.2 4.1  CL 103 106  CO2 23 24  GLUCOSE 216* 144*  BUN 16 14  CREATININE 1.13* 1.21*  CALCIUM 9.5 9.5   Liver Function Tests: Recent Labs  Lab 03/17/20 1102  AST 20  ALT 19  ALKPHOS 48  BILITOT 0.4  PROT 7.2  ALBUMIN 4.0   No results for input(s): LIPASE, AMYLASE in the last 168 hours. No results for input(s): AMMONIA in the last 168 hours. CBC: Recent Labs  Lab 03/17/20 1102  WBC 7.9  NEUTROABS 4.1  HGB 12.9  HCT 38.8  MCV 87.6   PLT 259   Cardiac Enzymes: No results for input(s): CKTOTAL, CKMB, CKMBINDEX, TROPONINI in the last 168 hours. BNP: Invalid input(s): POCBNP CBG: Recent Labs  Lab 03/17/20 1616 03/17/20 2154  GLUCAP 230* 139*   D-Dimer No results for input(s): DDIMER in the last 72 hours. Hgb A1c No results for input(s): HGBA1C in the last 72 hours. Lipid Profile Recent Labs    03/18/20 0549  CHOL 141  HDL 39*  LDLCALC 69  TRIG 165*  CHOLHDL 3.6   Thyroid function studies No results for input(s): TSH, T4TOTAL, T3FREE, THYROIDAB in the last 72 hours.  Invalid input(s): FREET3 Anemia work up No results for input(s): VITAMINB12, FOLATE, FERRITIN, TIBC, IRON, RETICCTPCT in the last 72 hours. Urinalysis    Component Value Date/Time   COLORURINE YELLOW 11/05/2013 [place] 11/05/2013 1205   LABSPEC 1.020 11/05/2013 1205   PHURINE 5.5 11/05/2013 1205   GLUCOSEU >1000 (A) 11/05/2013 1205   HGBUR NEGATIVE 11/05/2013 1205   BILIRUBINUR neg 01/22/2020 1439   KETONESUR 15 (A) 11/05/2013 1205   PROTEINUR Positive (A) 01/22/2020 1439   PROTEINUR NEGATIVE 11/05/2013 1205   UROBILINOGEN 0.2 01/22/2020 1439   UROBILINOGEN 0.2 11/05/2013 1205   NITRITE neg 01/22/2020 1439   NITRITE NEGATIVE 11/05/2013 1205   LEUKOCYTESUR Trace (A) 01/22/2020 1439   Sepsis Labs Invalid input(s): PROCALCITONIN,  WBC,  LACTICIDVEN Microbiology Recent Results (from the past 240 hour(s))  Respiratory Panel by RT PCR (Flu A&B, Covid) - Nasopharyngeal Swab     Status: None   Collection Time: 03/17/20  12:52 PM   Specimen: Nasopharyngeal Swab  Result Value Ref Range Status   SARS Coronavirus 2 by RT PCR NEGATIVE NEGATIVE Final    Comment: (NOTE) SARS-CoV-2 target nucleic acids are NOT DETECTED. The SARS-CoV-2 RNA is generally detectable in upper respiratoy specimens during the acute phase of infection. The lowest concentration of SARS-CoV-2 viral copies this assay can detect is 131 copies/mL.  A negative result does not preclude SARS-Cov-2 infection and should not be used as the sole basis for treatment or other patient management decisions. A negative result may occur with  improper specimen collection/handling, submission of specimen other than nasopharyngeal swab, presence of viral mutation(s) within the areas targeted by this assay, and inadequate number of viral copies (<131 copies/mL). A negative result must be combined with clinical observations, patient history, and epidemiological information. The expected result is Negative. Fact Sheet for Patients:  PinkCheek.be Fact Sheet for Healthcare Providers:  GravelBags.it This test is not yet ap proved or cleared by the [place] FDA and  has been authorized for detection and/or diagnosis of SARS-CoV-2 by FDA under an Emergency Use Authorization (EUA). This EUA will remain  in effect (meaning this test can be used) for the duration of the COVID-19 declaration under Section 564(b)(1) of the Act, 21 U.S.C. section 360bbb-3(b)(1), unless the authorization is terminated or revoked sooner.    Influenza A by PCR NEGATIVE NEGATIVE Final   Influenza B by PCR NEGATIVE NEGATIVE Final    Comment: (NOTE) The Xpert Xpress SARS-CoV-2/FLU/RSV assay is intended as an aid in  the diagnosis of influenza from Nasopharyngeal swab specimens and  should not be used as a sole basis for treatment. Nasal washings and  aspirates are unacceptable for Xpert Xpress SARS-CoV-2/FLU/RSV  testing. Fact Sheet for Patients: PinkCheek.be Fact Sheet for Healthcare Providers: GravelBags.it This test is not yet approved or cleared by the [place] FDA and  has been authorized for detection and/or diagnosis of SARS-CoV-2 by  FDA under an Emergency Use Authorization (EUA). This EUA will remain  in effect (meaning this test can be used)  for the duration of the  Covid-19 declaration under Section 564(b)(1) of the Act, 21  U.S.C. section 360bbb-3(b)(1), unless the authorization is  terminated or revoked. Performed at [facility], [address] [place], [place] [address]    Time coordinating discharge:   SIGNED:  Irwin Brakeman, MD  [facility] 03/18/2020, 11:17 AM How to contact the [facility] Attending or Consulting provider [place] or covering provider during after hours [place], for this patient?  1. Check the care team in [facility] and look for a) attending/consulting TR[facility] provider listed and b) the [facility] team listed 2. Log into [email or web address] and use [place]'s universal password to access. If you do not have the password, please contact the hospital operator. 3. Locate the [facility] provider you are looking for under [facility] and page to a number that you can be directly reached. 4. If you still have difficulty reaching the provider, please page the [facility] (Director on Call) for the Hospitalists listed on amion for assistance.

## 2020-03-18 NOTE — Discharge Instructions (Signed)
A 30-day cardiac monitor is being arranged for you.  The heart care office will send the monitor to your home with instructions on how to attach it.  Please follow-up with the neurologist in 3 to 4 weeks for stroke follow-up.  Please continue taking aspirin and Plavix together for 30 days only and then discontinue the aspirin but continue taking the Plavix every day.   IMPORTANT INFORMATION: PAY CLOSE ATTENTION   PHYSICIAN DISCHARGE INSTRUCTIONS  Follow with Primary care provider  Kathyrn Drown, MD  and other consultants as instructed by your Hospitalist Physician  [place] IF SYMPTOMS COME BACK, WORSEN OR NEW PROBLEM DEVELOPS   Please note: You were cared for by a hospitalist during your hospital stay. Every effort will be made to forward records to your primary care provider.  You can request that your primary care provider send for your hospital records if they have not received them.  Once you are discharged, your primary care physician will handle any further medical issues. Please note that NO REFILLS for any discharge medications will be authorized once you are discharged, as it is imperative that you return to your primary care physician (or establish a relationship with a primary care physician if you do not have one) for your post hospital discharge needs so that they can reassess your need for medications and monitor your lab values.  Please get a complete blood count and chemistry panel checked by your Primary MD at your next visit, and again as instructed by your Primary MD.  Get Medicines reviewed and adjusted: Please take all your medications with you for your next visit with your Primary MD  Laboratory/radiological data: Please request your Primary MD to go over all hospital tests and procedure/radiological results at the follow up, please ask your primary care provider to get all Hospital records sent to his/her office.  In some cases,  they will be blood work, cultures and biopsy results pending at the time of your discharge. Please request that your primary care provider follow up on these results.  If you are diabetic, please bring your blood sugar readings with you to your follow up appointment with primary care.    Please call and make your follow up appointments as soon as possible.    Also Note the following: If you experience worsening of your admission symptoms, develop shortness of breath, life threatening emergency, suicidal or homicidal thoughts you must seek medical attention immediately by calling 911 or calling your MD immediately  if symptoms less severe.  You must read complete instructions/literature along with all the possible adverse reactions/side effects for all the Medicines you take and that have been prescribed to you. Take any new Medicines after you have completely understood and accpet all the possible adverse reactions/side effects.   Do not drive when taking Pain medications or sleeping medications (Benzodiazepines)  Do not take more than prescribed Pain, Sleep and Anxiety Medications. It is not advisable to combine anxiety,sleep and pain medications without talking with your primary care practitioner  Special Instructions: If you have smoked or chewed Tobacco  in the last 2 yrs please stop smoking, stop any regular Alcohol  and or any Recreational drug use.  Wear Seat belts while driving.  Do not drive if taking any narcotic, mind altering or controlled substances or recreational drugs or alcohol.         Hospital Discharge After a Stroke  Being discharged from the hospital  after a stroke can feel overwhelming. Many things may be different, and it is normal to feel scared or anxious. Some stroke survivors may be able to return to their homes, and others may need more specialized care on a temporary or permanent basis. Your stroke care team will work with you to develop a discharge plan that  is best for you. Ask questions if you do not understand something. Invite a friend or family member to participate in discharge planning. Understanding and following your discharge plan can help to prevent another stroke or other problems. Understanding your medicines After a stroke, your health care provider may prescribe one or more types of medicine. It is important to take medicines exactly as told by your health care provider. Serious harm, such as another stroke, can happen if you are unable to take your medicine exactly as prescribed. Make sure you understand:  What medicine to take.  Why you are taking the medicine.  How and when to take it.  If it can be taken with your other medicines and herbal supplements.  Possible side effects.  When to call your health care provider if you have any side effects.  How you will get and pay for your medicines. Medical assistance programs may be able to help you pay for prescription medicines if you cannot afford them. If you are taking an anticoagulant, be sure to take it exactly as told by your health care provider. This type of medicine can increase the risk of bleeding because it works to prevent blood from clotting. You may need to take certain precautions to prevent bleeding. You should contact your health care provider if you have:  Bleeding or bruising.  A fall or other injury to your head.  Blood in your urine or stool (feces). Planning for home safety  Take steps to prevent falls, such as installing grab bars or using a shower chair. Ask a friend or family member to get needed things in place before you go home if possible. A therapist can come to your home to make recommendations for safety equipment. Ask your health care provider if you would benefit from this service or from home care. Getting needed equipment Ask your health care provider for a list of any medical equipment and supplies you will need at home. These may include  items such as:  Walkers.  Canes.  Wheelchairs.  Hand-strengthening devices.  Special eating utensils. Medical equipment can be rented or purchased, depending on your insurance coverage. Check with your insurance company about what is covered. Keeping follow-up visits After a stroke, you will need to follow up regularly with a health care provider. You may also need rehabilitation, which can include physical therapy, occupational therapy, or speech-language therapy. Keeping these appointments is very important to your recovery after a stroke. Be sure to bring your medicine list and discharge papers with you to your appointments. If you need help to keep track of your schedule, use a calendar or appointment reminder. Preventing another stroke Having a stroke puts you at risk for another stroke in the future. Ask your health care provider what actions you can take to lower the risk. These may include:  Increasing how much you exercise.  Making a healthy eating plan.  Quitting smoking.  Managing other health conditions, such as high blood pressure, high cholesterol, or diabetes.  Limiting alcohol use. Knowing the warning signs of a stroke  Make sure you understand the signs of a stroke. Before you leave  the hospital, you will receive information outlining the stroke warning signs. Share these with your friends and family members. "BE FAST" is an easy way to remember the main warning signs of a stroke:  B - Balance. Signs are dizziness, sudden trouble walking, or loss of balance.  E - Eyes. Signs are trouble seeing or a sudden change in vision.  F - Face. Signs are sudden weakness or numbness of the face, or the face or eyelid drooping on one side.  A - Arms. Signs are weakness or numbness in an arm. This happens suddenly and usually on one side of the body.  S - Speech. Signs are sudden trouble speaking, slurred speech, or trouble understanding what people say.  T - Time. Time  to call emergency services. Write down what time symptoms started. Other signs of stroke may include:  A sudden, severe headache with no known cause.  Nausea or vomiting.  Seizure. These symptoms may represent a serious problem that is an emergency. Do not wait to see if the symptoms will go away. Get medical help right away. Call your local emergency services (911 in the [place] Do not drive yourself to the hospital. Make note of the time that you had your first symptoms. Your emergency responders or emergency room staff will need to know this information. Summary  Being discharged from the hospital after a stroke can feel overwhelming. It is normal to feel scared or anxious.  Make sure you take medicines exactly as told by your health care provider.  Know the warning signs of a stroke, and get help right way if you have any of these symptoms. "BE FAST" is an easy way to remember the main warning signs of a stroke. This information is not intended to replace advice given to you by your health care provider. Make sure you discuss any questions you have with your health care provider. Document Revised: 07/29/2019 Document Reviewed: 02/08/2017 Elsevier Patient Education  [place].

## 2020-03-18 NOTE — Progress Notes (Signed)
Patient's blood glucose 140. Meter didn't transfer blood glucose over.

## 2020-03-18 NOTE — Progress Notes (Signed)
SLP Cancellation Note  Patient Details Name: Jamie Harding MRN: [id number] DOB: 1955/12/23   Cancelled treatment:       Reason Eval/Treat Not Completed: SLP screened, no needs identified, will sign off. All symptoms have resolved. Thank you for this referral,  Jamie Harding H. Roddie Mc, CCC-SLP Speech Language Pathologist    Wende Bushy 03/18/2020, 10:21 AM

## 2020-03-23 ENCOUNTER — Ambulatory Visit (INDEPENDENT_AMBULATORY_CARE_PROVIDER_SITE_OTHER): Payer: PPO

## 2020-03-23 ENCOUNTER — Other Ambulatory Visit: Payer: Self-pay

## 2020-03-23 ENCOUNTER — Ambulatory Visit (INDEPENDENT_AMBULATORY_CARE_PROVIDER_SITE_OTHER): Payer: PPO | Admitting: Family Medicine

## 2020-03-23 ENCOUNTER — Ambulatory Visit: Payer: PPO | Admitting: Family Medicine

## 2020-03-23 VITALS — BP 124/70 | Temp 98.3°F | Ht 62.0 in | Wt 200.8 lb

## 2020-03-23 DIAGNOSIS — I498 Other specified cardiac arrhythmias: Secondary | ICD-10-CM | POA: Diagnosis not present

## 2020-03-23 DIAGNOSIS — I499 Cardiac arrhythmia, unspecified: Secondary | ICD-10-CM

## 2020-03-23 DIAGNOSIS — Z79899 Other long term (current) drug therapy: Secondary | ICD-10-CM | POA: Diagnosis not present

## 2020-03-23 DIAGNOSIS — I10 Essential (primary) hypertension: Secondary | ICD-10-CM | POA: Diagnosis not present

## 2020-03-23 DIAGNOSIS — Z8673 Personal history of transient ischemic attack (TIA), and cerebral infarction without residual deficits: Secondary | ICD-10-CM | POA: Diagnosis not present

## 2020-03-23 DIAGNOSIS — E785 Hyperlipidemia, unspecified: Secondary | ICD-10-CM | POA: Diagnosis not present

## 2020-03-23 DIAGNOSIS — I639 Cerebral infarction, unspecified: Secondary | ICD-10-CM

## 2020-03-23 DIAGNOSIS — E1169 Type 2 diabetes mellitus with other specified complication: Secondary | ICD-10-CM | POA: Diagnosis not present

## 2020-03-23 NOTE — Progress Notes (Signed)
   Subjective:    Patient ID: Jamie Harding, female    DOB: 12-26-1955, 64 y.o.   MRN: [id number]  HPIhospitalization follow up.  Patient was in the hospital because of a stroke We went over her lab work we went over her MRI we went over warning signs of a stroke We also went over all of her medications looked at her glucose answer questions Patient not having any low sugar spells She denies any new numbness or tingling or weakness but does relate some left foot weakness and clumsiness She is able to walk without a cane She denies any bleeding issues  Has some questions about plavix.   Pt brought in blood sugar readings.   Needs referral to dr Merlene Laughter.     Review of Systems  Constitutional: Negative for activity change, appetite change and fatigue.  HENT: Negative for congestion and rhinorrhea.   Respiratory: Negative for cough and shortness of breath.   Cardiovascular: Negative for chest pain and leg swelling.  Gastrointestinal: Negative for abdominal pain and diarrhea.  Endocrine: Negative for polydipsia and polyphagia.  Skin: Negative for color change.  Neurological: Negative for dizziness and weakness.  Psychiatric/Behavioral: Negative for behavioral problems and confusion.       Objective:   Physical Exam Vitals reviewed.  Constitutional:      General: She is not in acute distress. HENT:     Head: Normocephalic.  Cardiovascular:     Rate and Rhythm: Normal rate and regular rhythm.     Heart sounds: Normal heart sounds. No murmur.  Pulmonary:     Effort: Pulmonary effort is normal.     Breath sounds: Normal breath sounds.  Lymphadenopathy:     Cervical: No cervical adenopathy.  Neurological:     Mental Status: She is alert.  Psychiatric:        Behavior: Behavior normal.     The patient is aware after 30 days to stop the aspirin and continue Plavix      Assessment & Plan:  1. High risk medication use Check lab work.  To do met 7 in the near  future  2. History of completed stroke I find no evidence of any serious ongoing troubles she does have fair balance a little bit of weakness in the left lower leg.  She is on Plavix and aspirin we did talk about how keeping A1c at 7 or less is to her benefit keeping her blood pressure under good control as to her benefit and healthy eating and losing weight.  3. Essential hypertension Blood pressure good continue current medications  4. Hyperlipidemia associated with type 2 diabetes mellitus ([place]) I am concerned about her diabetes she will do a better job with checking her sugar sugar send [name] several readings in the near future for evaluation.  We will get her in with neurology and she will follow-up with [name] within 3 months

## 2020-03-27 ENCOUNTER — Encounter: Payer: Self-pay | Admitting: Family Medicine

## 2020-03-28 MED ORDER — BLOOD GLUCOSE METER KIT: PACK | 0 refills | Status: DC

## 2020-03-30 ENCOUNTER — Encounter: Payer: Self-pay | Admitting: Family Medicine

## 2020-03-31 ENCOUNTER — Encounter: Payer: Self-pay | Admitting: Family Medicine

## 2020-04-05 ENCOUNTER — Encounter: Payer: Self-pay | Admitting: Family Medicine

## 2020-04-06 ENCOUNTER — Other Ambulatory Visit: Payer: Self-pay | Admitting: Family Medicine

## 2020-04-06 NOTE — Telephone Encounter (Signed)
Opening phone message for pain med. Pain medication pended and pt has been notified via [place]. Please advise. Thank you

## 2020-04-06 NOTE — Telephone Encounter (Signed)
Nurses Patient may have refills on her pain medicine But please send notification to the patient that her most recent visit was a hospital follow-up In order to comply with federal and state guidelines regarding pain medication I must do a office visit solely devoted to pain medicine for proper care and documentation to comply with current standards May pend refill for Apr 11, 2020 Please send this to me via a phone message and I will sign off on it Please notify patient that she will need to schedule pain visit mid June

## 2020-04-07 MED ORDER — HYDROCODONE-ACETAMINOPHEN 7.5-325 MG PO TABS: ORAL_TABLET | ORAL | 0 refills | Status: DC

## 2020-04-12 DIAGNOSIS — I10 Essential (primary) hypertension: Secondary | ICD-10-CM | POA: Diagnosis not present

## 2020-04-12 DIAGNOSIS — I69393 Ataxia following cerebral infarction: Secondary | ICD-10-CM | POA: Diagnosis not present

## 2020-04-12 DIAGNOSIS — I6930 Unspecified sequelae of cerebral infarction: Secondary | ICD-10-CM | POA: Diagnosis not present

## 2020-04-12 DIAGNOSIS — E1140 Type 2 diabetes mellitus with diabetic neuropathy, unspecified: Secondary | ICD-10-CM | POA: Diagnosis not present

## 2020-04-26 ENCOUNTER — Encounter: Payer: Self-pay | Admitting: Family Medicine

## 2020-04-27 ENCOUNTER — Other Ambulatory Visit: Payer: Self-pay | Admitting: *Deleted

## 2020-04-27 DIAGNOSIS — R197 Diarrhea, unspecified: Secondary | ICD-10-CM

## 2020-04-27 NOTE — Telephone Encounter (Signed)
Nurses-so in this situation I do not feel that it is due to Plavix  It is possible the patient may have acquired a bacteria that could be causing this I would recommend stool culture I also recommend stool C. difficile PCR with reflex Please help set this up for the patient Please notify the patient and send the patient a copy of this message

## 2020-04-29 ENCOUNTER — Ambulatory Visit: Payer: PPO | Admitting: Urology

## 2020-05-02 ENCOUNTER — Encounter: Payer: Self-pay | Admitting: Family Medicine

## 2020-05-02 ENCOUNTER — Ambulatory Visit (INDEPENDENT_AMBULATORY_CARE_PROVIDER_SITE_OTHER): Payer: PPO | Admitting: Family Medicine

## 2020-05-02 ENCOUNTER — Other Ambulatory Visit: Payer: Self-pay

## 2020-05-02 VITALS — BP 130/72 | Temp 98.6°F | Ht 62.0 in | Wt 201.0 lb

## 2020-05-02 DIAGNOSIS — Z79899 Other long term (current) drug therapy: Secondary | ICD-10-CM | POA: Diagnosis not present

## 2020-05-02 DIAGNOSIS — Z79891 Long term (current) use of opiate analgesic: Secondary | ICD-10-CM | POA: Diagnosis not present

## 2020-05-02 DIAGNOSIS — R0600 Dyspnea, unspecified: Secondary | ICD-10-CM

## 2020-05-02 DIAGNOSIS — R0609 Other forms of dyspnea: Secondary | ICD-10-CM

## 2020-05-02 DIAGNOSIS — E1159 Type 2 diabetes mellitus with other circulatory complications: Secondary | ICD-10-CM | POA: Diagnosis not present

## 2020-05-02 MED ORDER — HYDROCODONE-ACETAMINOPHEN 7.5-325 MG PO TABS: 1.0000 | ORAL_TABLET | Freq: Four times a day (QID) | ORAL | 0 refills | Status: DC | PRN

## 2020-05-02 MED ORDER — HYDROCODONE-ACETAMINOPHEN 7.5-325 MG PO TABS: ORAL_TABLET | ORAL | 0 refills | Status: DC

## 2020-05-02 NOTE — Progress Notes (Signed)
Subjective:    Patient ID: Jamie Harding, female    DOB: 02/26/1956, 64 y.o.   MRN: [id number]  HPI   This patient was seen today for chronic pain  The medication list was reviewed and updated.   -Compliance with medication: yes  - Number patient states they take daily: 4  -when was the last dose patient took? today  The patient was advised the importance of maintaining medication and not using illegal substances with these.  Here for refills and follow up  The patient was educated that we can provide 3 monthly scripts for their medication, it is their responsibility to follow the instructions.  Side effects or complications from medications: none  Patient is aware that pain medications are meant to minimize the severity of the pain to allow their pain levels to improve to allow for better function. They are aware of that pain medications cannot totally remove their pain.  Due for UDT ( at least once per year) : 05/02/20  Patient reports no problems or concerns.  Long-term current use of opiate analgesic - Plan: ToxASSURE Select 13 ([place]), Urine  Type 2 diabetes mellitus with vascular disease ([place]) - Plan: Hemoglobin A1c  High risk medication use - Plan: Basic metabolic panel  DOE (dyspnea on exertion) - Plan: Ambulatory referral to Cardiology  Her diabetes overall doing well she does cement numbers many of the numbers look good but if you will marked quite elevated  She does have DOE when walking up inclines and doing any type of strenuous work with her diabetes this is concerning for the possibility of underlying heart disease  Although shortness of breath could be related to smoke exposure that she has had from her husband this is not the primary concern on the differential  Review of Systems  Constitutional: Negative for activity change and appetite change.  HENT: Negative for congestion and rhinorrhea.   Respiratory: Positive for shortness of breath. Negative for  cough.   Cardiovascular: Negative for chest pain and leg swelling.  Gastrointestinal: Negative for abdominal pain, nausea and vomiting.  Skin: Negative for color change.  Neurological: Negative for dizziness and weakness.  Psychiatric/Behavioral: Negative for agitation and confusion.   Please see above    Objective:   Physical Exam Vitals reviewed.  Constitutional:      General: She is not in acute distress. HENT:     Head: Normocephalic and atraumatic.  Eyes:     General:        Right eye: No discharge.        Left eye: No discharge.  Neck:     Trachea: No tracheal deviation.  Cardiovascular:     Rate and Rhythm: Normal rate and regular rhythm.     Heart sounds: Normal heart sounds. No murmur heard.   Pulmonary:     Effort: Pulmonary effort is normal. No respiratory distress.     Breath sounds: Normal breath sounds.  Lymphadenopathy:     Cervical: No cervical adenopathy.  Skin:    General: Skin is warm and dry.  Neurological:     Mental Status: She is alert.     Coordination: Coordination normal.  Psychiatric:        Behavior: Behavior normal.           Assessment & Plan:  1. Long-term current use of opiate analgesic The patient was seen in followup for chronic pain. A review over at their current pain status was discussed. Drug registry was checked.  Prescriptions were given.  Regular follow-up recommended. Discussion was held regarding the importance of compliance with medication as well as pain medication contract.  Patient was informed that medication may cause drowsiness and should not be combined  with other medications/alcohol or street drugs. If the patient feels medication is causing altered alertness then do not drive or operate dangerous equipment.  Drug registry - ToxASSURE  drug registry was checked 3 prescription sent inSelect 13 ([place]), Urine  2. Type 2 diabetes mellitus with vascular disease ([place]) Subpar control recheck A1c patient had recent  stroke await labs - Hemoglobin A1c  3. High risk medication use Patient had renal insufficiency while in the hospital recheck met 7 - Basic metabolic panel  4. DOE (dyspnea on exertion) Significant shortness of breath with activity walking up a hill and doing anything strenuous concerning for the possibility of underlying coronary artery disease being a diabetic potentially patient would benefit from having catheterization or at the very least imaging  Follow-up 3 months - Ambulatory referral to Cardiology

## 2020-05-03 ENCOUNTER — Encounter: Payer: Self-pay | Admitting: Family Medicine

## 2020-05-03 LAB — MED LIST ATTACHED SEPARATELY

## 2020-05-03 NOTE — Progress Notes (Signed)
CARDIOLOGY CONSULT NOTE       Patient ID: Jamie Harding MRN: [id number] DOB/AGE: 05-10-1956 64 y.o.  Admit date: (Not on file) Referring Physician: Wolfgang Phoenix Primary Physician: Kathyrn Drown, MD Primary Cardiologist: New Reason for Consultation: Dyspnea  Active Problems:   * No active hospital problems. *   HPI:  64 y.o. with history of  Stroke, HLD HTN, DM, Asthma and Anxiety/Depression Primary care office visit complained of exertional dyspnea. Dr Wolfgang Phoenix concerned this may be an anginal equivalent She was d/c from hospital with stroke 03/18/20 with symptoms of dizziness, unsteady gait and left foot weakness. MRI with acute left parasagittal pons infarct She was seen by neurology Dr Merlene Laughter. Started on ASA/Plavix for 30 days and then to continue plavix  DM not well controlled with A1c 7.9 She is on statin for HLD 30 day event monitor ordered. Reviewed results this ws negative for PAF NSR with average HR 78 bpm and rare PAD/PVC  03/17/20  Echo with EF 60-65% and no valve disease normal atrial sizes  Carotids done 03/18/20 with no significant disease She was previously seen by Dr Ron Parker and had myovue 12/03/14 to my review showed apical thinning with no ischemia low risk study She ended up having cardiac cath with Dr Burt Knack 01/03/15 which showed no obstructive CAD hyperdynamic EF and normal right heart pressures and PCWP 10 mmHg  Patient on disability for neuropathy and gastroparesis She use to work on 2W at [employer] and knows my wife  ROS All other systems reviewed and negative except as noted above  Past Medical History:  Diagnosis Date  . Anxiety   . Asthma   . Depression   . Essential hypertension   . Fatty liver   . Gastritis   . Gastroparesis   . GERD (gastroesophageal reflux disease)   . Hiatal hernia   . History of cardiac catheterization    Minimal coronary atherosclerosis February 2016  . History of kidney stones   . History of stroke 2008  . HLD (hyperlipidemia)   .  Neuropathy   . Obesity   . Pancreatitis   . Rheumatoid arthritis(714.0)   . Stroke ([place])   . Tibialis tendinitis   . Type 2 diabetes mellitus (HCC)     Family History  Problem Relation Age of Onset  . Breast cancer Mother   . Diabetes Father   . Coronary artery disease Other   . Arthritis Other   . Asthma Other   . Diabetes Sister   . Colon cancer Neg Hx     Social History   Socioeconomic History  . Marital status: Married    Spouse name: Not on file  . Number of children: Not on file  . Years of education: Not on file  . Highest education level: Not on file  Occupational History  . Occupation: Chartered certified accountant    Comment: [employer], 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)    Employer: [employer]  Tobacco Use  . Smoking status: Former Smoker    Packs/day: 0.50    Years: 3.00    Pack years: 1.50    Types: Cigarettes    Quit date: 09/11/1987    Years since quitting: 32.6  . Smokeless tobacco: Never Used  Substance and Sexual Activity  . Alcohol use: No  . Drug use: No  . Sexual activity: Not Currently    Birth control/protection: Surgical  Other Topics Concern  . Not on file  Social History Narrative  . Not on  file   Social Determinants of Health   Financial Resource Strain:   . Difficulty of Paying Living Expenses:   Food Insecurity:   . Worried About Charity fundraiser in the Last Year:   . Arboriculturist in the Last Year:   Transportation Needs:   . Film/video editor (Medical):   Marland Kitchen Lack of Transportation (Non-Medical):   Physical Activity:   . Days of Exercise per Week:   . Minutes of Exercise per Session:   Stress:   . Feeling of Stress :   Social Connections:   . Frequency of Communication with Friends and Family:   . Frequency of Social Gatherings with Friends and Family:   . Attends Religious Services:   . Active Member of Clubs or Organizations:   . Attends Archivist Meetings:   Marland Kitchen Marital Status:   Intimate Partner Violence:   .  Fear of Current or Ex-Partner:   . Emotionally Abused:   Marland Kitchen Physically Abused:   . Sexually Abused:     Past Surgical History:  Procedure Laterality Date  . ANKLE SURGERY     remove extra bone-left  . CARPAL TUNNEL RELEASE     right side  . CARPAL TUNNEL RELEASE Left 02/06/2013   Procedure: CARPAL TUNNEL RELEASE ;  Surgeon: Carole Civil, MD;  Location: [facility];  Service: Orthopedics;  Laterality: Left;  procedure end 1135  . CARPAL TUNNEL RELEASE Right 09/14/2015   Procedure: RIGHT CARPAL TUNNEL RELEASE;  Surgeon: Carole Civil, MD;  Location: [facility];  Service: Orthopedics;  Laterality: Right;  . CARPAL TUNNEL RELEASE Left 09/28/2015   Procedure: LEFT CARPAL TUNNEL RELEASE;  Surgeon: Carole Civil, MD;  Location: [facility];  Service: Orthopedics;  Laterality: Left;  procedure 1  . CESAREAN SECTION    . CHOLECYSTECTOMY    . COLONOSCOPY WITH PROPOFOL  09/16/2012   QVZ:DGLO sessile polyps ranging between 3-35m in size were found in the sigmoid colon and rectum; polypectomy was performed/Mild diverticulosis was noted throughout the entire examined colon/Small internal hemorrhoids  . DILATATION & CURETTAGE/HYSTEROSCOPY WITH MYOSURE N/A 08/05/2019   Procedure: DILATATION & CURETTAGE/HYSTEROSCOPY WITH MYOSURE;  Surgeon: DSloan Leiter MD;  Location: [facility]  Service: Gynecology;  Laterality: N/A;  myosure rep will be here.  Confirmed on 07/30/19 CS  . DILATION AND CURETTAGE OF UTERUS     multiple  . ESOPHAGOGASTRODUODENOSCOPY  07/29/2009   Mild gastritis, benign path  . ESOPHAGOGASTRODUODENOSCOPY (EGD) WITH PROPOFOL  09/16/2012   SLF:Non-erosive gastritis (inflammation) was found; multiple bx/The duodenal mucosa showed no abnormalities in the ampulla and bulb and second portion of the duodenum/NAUSEA/VOMITING MOST LIKELY DUE TO GERD/GASTRITIS  . FINGER SURGERY Left    left little finger-otif of finger  . Gastric Emptying  07/27/2009   The amount of activity in the  stomach at 120 minutes was 13% which is in the normal range  . LASER ABLATION OF THE CERVIX    . LEFT AND RIGHT HEART CATHETERIZATION WITH CORONARY ANGIOGRAM N/A 01/03/2015   Procedure: LEFT AND RIGHT HEART CATHETERIZATION WITH CORONARY ANGIOGRAM;  Surgeon: MBlane Ohara MD;  [facility];  Service: Cardiovascular;  Laterality: N/A;  . LITHOTRIPSY    . POLYPECTOMY  09/16/2012   Procedure: POLYPECTOMY;  Surgeon: SDanie Binder MD;  Location: [facility];  Service: Endoscopy;  Laterality: N/A;  . SAVORY DILATION  09/16/2012   Procedure: SAVORY DILATION;  Surgeon: SDanie Binder MD;  Location: [facility];  Service: Endoscopy;  Laterality: N/A;  16 fr dilation  . TRIGGER FINGER RELEASE Left 02/06/2013   Procedure: RELEASE TRIGGER FINGER LEFT LONG FINGER/A-1 PULLEY;  Surgeon: Carole Civil, MD;  Location: [facility];  Service: Orthopedics;  Laterality: Left;  procedure began 1136  . TRIGGER FINGER RELEASE Right 09/14/2015   Procedure: RIGHT LONG FINGER TRIGGER RELEASE;  Surgeon: Carole Civil, MD;  Location: [facility];  Service: Orthopedics;  Laterality: Right;  . TRIGGER FINGER RELEASE Left 09/28/2015   Procedure: LEFT RING TRIGGER FINGER RELEASE;  Surgeon: Carole Civil, MD;  Location: [facility];  Service: Orthopedics;  Laterality: Left;  left ring finger  . TUBAL LIGATION        Current Outpatient Medications:  .  albuterol (VENTOLIN HFA) 108 (90 Base) MCG/ACT inhaler, INHALE 2 PUFFS INTO THE LUNGS EVERY 4 (FOUR) HOURS AS NEEDED FOR WHEEZING., Disp: 54 g, Rfl: 5 .  amLODipine (NORVASC) 10 MG tablet, Take 1 tablet (10 mg total) by mouth daily., Disp: 90 tablet, Rfl: 1 .  blood glucose meter kit and supplies, Dispense based on patient and insurance preference. Pt checking sugars four times a day . (FOR ICD-10 E10.9, E11.9)., Disp: 1 each, Rfl: 0 .  cetirizine (ZYRTEC) 10 MG tablet, TAKE ONE TABLET (10MG TOTAL) BY MOUTH DAILY, Disp: 90 tablet, Rfl: 1 .  clopidogrel (PLAVIX) 75 MG tablet,  Take 1 tablet (75 mg total) by mouth daily with breakfast., Disp: 30 tablet, Rfl: 2 .  fluticasone (FLONASE) 50 MCG/ACT nasal spray, Place 2 sprays into both nostrils daily., Disp: 48 g, Rfl: 3 .  HYDROcodone-acetaminophen (NORCO) 7.5-325 MG tablet, Take one tablet po every 6 hrs prn moderate pain, Disp: 120 tablet, Rfl: 0 .  HYDROcodone-acetaminophen (NORCO) 7.5-325 MG tablet, Take 1 tablet by mouth every 6 (six) hours as needed for moderate pain., Disp: 120 tablet, Rfl: 0 .  HYDROcodone-acetaminophen (NORCO) 7.5-325 MG tablet, Take 1 tablet by mouth every 6 (six) hours as needed for moderate pain., Disp: 120 tablet, Rfl: 0 .  insulin detemir (LEVEMIR) 100 UNIT/ML injection, 70 units in the am and 60 units qhs 70 units in the am and 60 units qhs, Disp: 15 mL, Rfl: 5 .  lisinopril (ZESTRIL) 20 MG tablet, TAKE ONE AND ONE-HALF TABLET (30MG TOTAL) BY MOUTH DAILY, Disp: 135 tablet, Rfl: 1 .  meclizine (ANTIVERT) 25 MG tablet, Take 1 tablet (25 mg total) by mouth 3 (three) times daily as needed for dizziness., Disp: 24 tablet, Rfl: 0 .  metFORMIN (GLUCOPHAGE) 1000 MG tablet, TAKE 1 TABLET (1000 MG TOTAL) BY MOUTH 2TIMES A DAY., Disp: 180 tablet, Rfl: 1 .  Multiple Vitamin (MULTIVITAMIN WITH MINERALS) TABS, Take 1 tablet by mouth daily., Disp: , Rfl:  .  mupirocin ointment (BACTROBAN) 2 %, Apply 1 application topically 2 (two) times daily., Disp: 30 g, Rfl: 0 .  NOVOLOG FLEXPEN 100 UNIT/ML FlexPen, Inject 22 Units into the skin 3 (three) times daily with meals. (Patient taking differently: Inject 15-20 Units into the skin 3 (three) times daily with meals. ), Disp: 15 mL, Rfl: 1 .  ondansetron (ZOFRAN) 8 MG tablet, TAKE ONE TABLET BY MOUTH EVERY 12 HOURS AS NEEDED FOR NAUSEA, Disp: 20 tablet, Rfl: 5 .  pantoprazole (PROTONIX) 40 MG tablet, TAKE 1 TABLET (40 MG TOTAL) BY MOUTH DAILY., Disp: 90 tablet, Rfl: 1 .  promethazine (PHENERGAN) 25 MG tablet, TAKE ONE TABLET BY MOUTH EVERY EIGHT HOURS AS NEEDED FOR  NAUSEA, Disp: 30  tablet, Rfl: 5 .  rosuvastatin (CRESTOR) 20 MG tablet, TAKE ONE TABLET BY MOUTH ONCE DAILY., Disp: 90 tablet, Rfl: 0 .  sitaGLIPtin (JANUVIA) 100 MG tablet, Take 1 tablet (100 mg total) by mouth daily., Disp: 90 tablet, Rfl: 1 .  triamcinolone cream (KENALOG) 0.1 %, Apply BID prn, Disp: 45 g, Rfl: 3 .  trimethoprim (TRIMPEX) 100 MG tablet, Take 1 tablet (100 mg total) by mouth at bedtime., Disp: 30 tablet, Rfl: 11 .  venlafaxine (EFFEXOR) 75 MG tablet, Take three tablets daily, Disp: 270 tablet, Rfl: 1    Physical Exam: Blood pressure 100/60, pulse 91, height _0  (1.575 m), weight 200 lb 3.2 oz (90.8 kg), SpO2 93 %.    Affect appropriate Overweight white female  HEENT: normal Neck supple with no adenopathy JVP normal no bruits no thyromegaly Lungs clear with no wheezing and good diaphragmatic motion Heart:  S1/S2 no murmur, no rub, gallop or click PMI normal Abdomen: benighn, BS positve, no tenderness, no AAA no bruit.  No HSM or HJR Distal pulses intact with no bruits No edema Neuro non-focal Skin warm and dry No muscular weakness   Labs:   Lab Results  Component Value Date   WBC 7.9 03/17/2020   HGB 12.9 03/17/2020   HCT 38.8 03/17/2020   MCV 87.6 03/17/2020   PLT 259 03/17/2020   No results for input(s): NA, K, CL, CO2, BUN, CREATININE, CALCIUM, PROT, BILITOT, ALKPHOS, ALT, AST, GLUCOSE in the last 168 hours.  Invalid input(s): LABALBU No results found for: CKTOTAL, CKMB, CKMBINDEX, TROPONINI  Lab Results  Component Value Date   CHOL 141 03/18/2020   CHOL 142 02/05/2020   CHOL 161 09/03/2019   Lab Results  Component Value Date   HDL 39 (L) 03/18/2020   HDL 47 (L) 02/05/2020   HDL 50 09/03/2019   Lab Results  Component Value Date   LDLCALC 69 03/18/2020   LDLCALC 77 02/05/2020   LDLCALC 88 09/03/2019   Lab Results  Component Value Date   TRIG 165 (H) 03/18/2020   TRIG 101 02/05/2020   TRIG 130 09/03/2019   Lab Results  Component  Value Date   CHOLHDL 3.6 03/18/2020   CHOLHDL 3.0 02/05/2020   CHOLHDL 3.2 09/03/2019   No results found for: LDLDIRECT    Radiology: No results found.  EKG: SR rate 90 nonspecific ST changes 03/17/20   ASSESSMENT AND PLAN:   1. DM:  Poorly controlled A1c close to 8 f/u with primary to adjust insulin  2. HTN:  Well controlled.  Continue current medications and low sodium Dash type diet.   3. HLD:  Continue statin target LDL in diabetic with stroke < 70 4. CVA:  Lacunar benign telemetry , no obstructive carotid disease , normal echo continue DAT 5. Dyspnea:  Recent echo with normal LV/RV function no valve disease.  Cath in 2016 with no obstrucitve CAD and normal right heart pressures. With ongoing risk factors agree with f/u Lexiscan myovue to further risk stratify. Needs CXR as well none done on recent admission for stroke   CXR Lexiscan Myovue   F/U in a year   Signed: Jenkins Rouge 05/05/2020, 10:38 AM

## 2020-05-05 ENCOUNTER — Ambulatory Visit: Payer: PPO | Admitting: Cardiovascular Disease

## 2020-05-05 ENCOUNTER — Ambulatory Visit (HOSPITAL_COMMUNITY)
Admission: RE | Admit: 2020-05-05 | Discharge: 2020-05-05 | Disposition: A | Discharge status: Home / Self Care | Payer: PPO | Source: Ambulatory Visit | Attending: Cardiovascular Disease | Admitting: Cardiovascular Disease

## 2020-05-05 ENCOUNTER — Encounter: Payer: Self-pay | Admitting: Cardiovascular Disease

## 2020-05-05 ENCOUNTER — Other Ambulatory Visit: Payer: Self-pay

## 2020-05-05 VITALS — BP 100/60 | HR 91 | Ht 62.0 in | Wt 200.2 lb

## 2020-05-05 DIAGNOSIS — R0600 Dyspnea, unspecified: Secondary | ICD-10-CM | POA: Insufficient documentation

## 2020-05-05 DIAGNOSIS — J984 Other disorders of lung: Secondary | ICD-10-CM | POA: Diagnosis not present

## 2020-05-05 DIAGNOSIS — R0602 Shortness of breath: Secondary | ICD-10-CM | POA: Diagnosis not present

## 2020-05-05 DIAGNOSIS — R21 Rash and other nonspecific skin eruption: Secondary | ICD-10-CM | POA: Diagnosis not present

## 2020-05-05 LAB — SPECIMEN STATUS REPORT

## 2020-05-05 LAB — TOXASSURE SELECT 13 (MW), URINE

## 2020-05-05 NOTE — Patient Instructions (Signed)
Medication Instructions:  Your physician recommends that you continue on your current medications as directed. Please refer to the Current Medication list given to you today.  *If you need a refill on your cardiac medications before your next appointment, please call your pharmacy*   Lab Work: None today If you have labs (blood work) drawn today and your tests are completely normal, you will receive your results only by: Marland Kitchen MyChart Message (if you have MyChart) OR . A paper copy in the mail If you have any lab test that is abnormal or we need to change your treatment, we will call you to review the results.   Testing/Procedures: Your physician has requested that you have a lexiscan myoview. For further information please visit [email or web address]. Please follow instruction sheet, as given.  Please get Chest X-ray today    Follow-Up: At [facility], you and your health needs are our priority.  As part of our continuing mission to provide you with exceptional heart care, we have created designated Provider Care Teams.  These Care Teams include your primary Cardiologist (physician) and Advanced Practice Providers (APPs -  Physician Assistants and Nurse Practitioners) who all work together to provide you with the care you need, when you need it.  We recommend signing up for the patient portal called "MyChart".  Sign up information is provided on this After Visit Summary.  MyChart is used to connect with patients for Virtual Visits (Telemedicine).  Patients are able to view lab/test results, encounter notes, upcoming appointments, etc.  Non-urgent messages can be sent to your provider as well.   To learn more about what you can do with MyChart, go to [email or web address].    Your next appointment:   12 month(s)  The format for your next appointment:   In Person  Provider:   Jenkins Rouge, MD   Other Instructions None      Thank you for choosing [facility] !

## 2020-05-06 ENCOUNTER — Ambulatory Visit: Payer: PPO | Admitting: Urology

## 2020-05-10 ENCOUNTER — Encounter: Payer: Self-pay | Admitting: Family Medicine

## 2020-05-17 ENCOUNTER — Other Ambulatory Visit: Payer: Self-pay | Admitting: Family Medicine

## 2020-05-17 NOTE — Progress Notes (Signed)
03/49/1791 5:05 PM   Jamie Harding [birth year] [id number]  Referring provider: Kathyrn Drown, MD [address] [place],  [place] [address]  Dysuria  HPI: Jamie Harding is a 64yo here for followup for nephrolithiasis and recurrent UTIs.  She was started on TMP 116m nightly for suppression and has done well with only one breakthrough infection since her last visit.   UA today is clear but concentrated.  She has occasional dysuria.  She has no urgency or incontinence.   She has a history of stones but no worrisome symptoms.  She had a CVA in April and had some left leg and foot weakness and dizziness. She is now on Plavix.    PMH: Past Medical History:  Diagnosis Date   Anxiety    Asthma    Depression    Essential hypertension    Fatty liver    Gastritis    Gastroparesis    GERD (gastroesophageal reflux disease)    Hiatal hernia    History of cardiac catheterization    Minimal coronary atherosclerosis February 2016   History of kidney stones    History of stroke 2008   HLD (hyperlipidemia)    Neuropathy    Obesity    Pancreatitis    Rheumatoid arthritis(714.0)    Stroke ([place]    Tibialis tendinitis    Type 2 diabetes mellitus ([place]     Surgical History: Past Surgical History:  Procedure Laterality Date   ANKLE SURGERY     remove extra bone-left   CARPAL TUNNEL RELEASE     right side   CARPAL TUNNEL RELEASE Left 02/06/2013   Procedure: CARPAL TUNNEL RELEASE ;  Surgeon: SCarole Civil MD;  Location: [facility];  Service: Orthopedics;  Laterality: Left;  [facility]   Procedure: RIGHT CARPAL TUNNEL RELEASE;  Surgeon: SCarole Civil MD;  Location: [facility];  Service: Orthopedics;  Laterality: Right;   CARPAL TUNNEL RELEASE Left 09/28/2015   Procedure: LEFT CARPAL TUNNEL RELEASE;  Surgeon: SCarole Civil MD;  Location: [facility];  Service: Orthopedics;  Laterality: Left;  procedure 1    CESAREAN SECTION     CHOLECYSTECTOMY     COLONOSCOPY WITH PROPOFOL  09/16/2012   SMLJ:QGBEsessile polyps ranging between 3-548min size were found in the sigmoid colon and rectum; polypectomy was performed/Mild diverticulosis was noted throughout the entire examined colon/Small internal hemorrhoids   DILATATION & CURETTAGE/HYSTEROSCOPY WITH MYOSURE N/A 08/05/2019   Procedure: [place]geon: DaSloan LeiterMD;  [facility] Service: [facility];  Laterality: N/A;  myosure rep will be here.  Confirmed on 07/30/19 CS   DILATION AND CURETTAGE OF UTERUS     multiple   ESOPHAGOGASTRODUODENOSCOPY  07/29/2009   Mild gastritis, benign path   ESOPHAGOGASTRODUODENOSCOPY (EGD) WITH PROPOFOL  09/16/2012   SLF:Non-erosive gastritis (inflammation) was found; multiple bx/The duodenal mucosa showed no abnormalities in the ampulla and bulb and second portion of the duodenum/NAUSEA/VOMITING MOST LIKELY DUE TO GERD/GASTRITIS   FINGER SURGERY Left    left little finger-otif of finger   Gastric Emptying  07/27/2009   The amount of activity in the stomach at 120 minutes was 13% which is in the normal range   LASER ABLATION OF THE CERVIX     LEFT AND RIGHT HEART CATHETERIZATION WITH CORONARY ANGIOGRAM N/A 01/03/2015   Procedure: LEFT AND RIGHT HEART CATHETERIZATION WITH CORONARY ANGIOGRAM;  Surgeon: Blane Ohara, MD;  Location: [facility] CATH LAB;  Service: Cardiovascular;  Laterality: N/A;   LITHOTRIPSY     POLYPECTOMY  09/16/2012   Procedure: POLYPECTOMY;  Surgeon: Danie Binder, MD;  Location: [facility];  Service: Endoscopy;  Laterality: N/A;   SAVORY DILATION  09/16/2012   Procedure: SAVORY DILATION;  Surgeon: Danie Binder, MD;  Location: [facility];  Service: Endoscopy;  Laterality: N/A;  16 fr dilation   TRIGGER FINGER RELEASE Left 02/06/2013   Procedure: RELEASE TRIGGER FINGER LEFT LONG FINGER/A-1 PULLEY;  Surgeon: Carole Civil,  MD;  Location: [facility];  Service: Orthopedics;  Laterality: Left;  procedure began [place] Right 09/14/2015   Procedure: RIGHT LONG FINGER TRIGGER RELEASE;  Surgeon: Carole Civil, MD;  Location: [facility];  Service: Orthopedics;  Laterality: Right;   TRIGGER FINGER RELEASE Left 09/28/2015   Procedure: LEFT RING TRIGGER FINGER RELEASE;  Surgeon: Carole Civil, MD;  Location: [facility];  Service: Orthopedics;  Laterality: Left;  left ring finger   TUBAL LIGATION      Home Medications:  Allergies as of 05/20/2020      Reactions   Byetta 10 Mcg Pen [exenatide] Diarrhea, Nausea And Vomiting     touch of pancreatis   Naproxen Nausea And Vomiting   Pollen Extract    Augmentin [amoxicillin-pot Clavulanate]    diarrhea   Azithromycin Nausea And Vomiting, Rash   Erythromycin Hives       Invokana [canagliflozin]    Urinary issues,yeast infection   Morphine Hives, Rash   Sulfonamide Derivatives Nausea And Vomiting, Rash      Medication List       Accurate as of May 20, 2020  2:41 PM. If you have any questions, ask your nurse or doctor.        albuterol 108 (90 Base) MCG/ACT inhaler Commonly known as: Ventolin HFA INHALE 2 PUFFS INTO THE LUNGS EVERY 4 (FOUR) HOURS AS NEEDED FOR WHEEZING.   amLODipine 10 MG tablet Commonly known as: NORVASC Take 1 tablet (10 mg total) by mouth daily.   Aspirin Buf(CaCarb-MgCarb-MgO) 81 MG Tabs aspirin 81 mg tablet  Take 1 mg every day by oral route.   blood glucose meter kit and supplies Dispense based on patient and insurance preference. Pt checking sugars four times a day . (FOR ICD-10 E10.9, E11.9).   cetirizine 10 MG tablet Commonly known as: ZYRTEC TAKE ONE TABLET (10MG TOTAL) BY MOUTH DAILY   clopidogrel 75 MG tablet Commonly known as: PLAVIX TAKE ONE TABLET (75MG TOTAL) BY MOUTH DAILY WITH BREAKFAST   fluticasone 50 MCG/ACT nasal spray Commonly known as: FLONASE Place 2 sprays into both nostrils daily.     HYDROcodone-acetaminophen 7.5-325 MG tablet Commonly known as: NORCO Take one tablet po every 6 hrs prn moderate pain   HYDROcodone-acetaminophen 7.5-325 MG tablet Commonly known as: NORCO Take 1 tablet by mouth every 6 (six) hours as needed for moderate pain.   HYDROcodone-acetaminophen 7.5-325 MG tablet Commonly known as: NORCO Take 1 tablet by mouth every 6 (six) hours as needed for moderate pain.   insulin detemir 100 UNIT/ML injection Commonly known as: LEVEMIR 70 units in the am and 60 units qhs 70 units in the am and 60 units qhs   lisinopril 20 MG tablet Commonly known as: ZESTRIL TAKE ONE AND ONE-HALF TABLET (30MG TOTAL) BY MOUTH DAILY   meclizine 25 MG tablet Commonly known as: ANTIVERT Take 1 tablet (25 mg total)  by mouth 3 (three) times daily as needed for dizziness.   metFORMIN 1000 MG tablet Commonly known as: GLUCOPHAGE TAKE 1 TABLET (1000 MG TOTAL) BY MOUTH 2TIMES A DAY.   multivitamin with minerals Tabs tablet Take 1 tablet by mouth daily.   mupirocin ointment 2 % Commonly known as: Bactroban Apply 1 application topically 2 (two) times daily.   NovoLOG FlexPen 100 UNIT/ML FlexPen Generic drug: insulin aspart Inject 22 Units into the skin 3 (three) times daily with meals. What changed: how much to take   ondansetron 8 MG tablet Commonly known as: ZOFRAN TAKE ONE TABLET BY MOUTH EVERY 12 HOURS AS NEEDED FOR NAUSEA   pantoprazole 40 MG tablet Commonly known as: PROTONIX TAKE 1 TABLET (40 MG TOTAL) BY MOUTH DAILY.   promethazine 25 MG tablet Commonly known as: PHENERGAN TAKE ONE TABLET BY MOUTH EVERY EIGHT HOURS AS NEEDED FOR NAUSEA   rosuvastatin 20 MG tablet Commonly known as: CRESTOR TAKE ONE TABLET BY MOUTH ONCE DAILY.   sitaGLIPtin 100 MG tablet Commonly known as: Januvia Take 1 tablet (100 mg total) by mouth daily.   triamcinolone cream 0.1 % Commonly known as: KENALOG Apply BID prn   trimethoprim 100 MG tablet Commonly known as:  TRIMPEX Take 1 tablet (100 mg total) by mouth at bedtime.   venlafaxine 75 MG tablet Commonly known as: EFFEXOR Take three tablets daily       Allergies:  Allergies  Allergen Reactions   Byetta 10 Mcg Pen [Exenatide] Diarrhea and Nausea And Vomiting      touch of pancreatis   Naproxen Nausea And Vomiting   Pollen Extract    Augmentin [Amoxicillin-Pot Clavulanate]     diarrhea   Azithromycin Nausea And Vomiting and Rash   Erythromycin Hives        Invokana [Canagliflozin]     Urinary issues,yeast infection   Morphine Hives and Rash   Sulfonamide Derivatives Nausea And Vomiting and Rash    Family History: Family History  Problem Relation Age of Onset   Breast cancer Mother    Diabetes Father    Coronary artery disease Other    Arthritis Other    Asthma Other    Diabetes Sister    Colon cancer Neg Hx     Social History:  reports that she quit smoking about 32 years ago. Her smoking use included cigarettes. She has a 1.50 pack-year smoking history. She has never used smokeless tobacco. She reports that she does not drink alcohol and does not use drugs.  ROS: She has some residual numbness in her left foot and balance issues but all other review of systems were reviewed and are negative except what is noted above in HPI  Physical Exam: BP 96/62    Pulse 81    Temp (!) 97.3 F (36.3 C)    Ht 5' 2"  (1.575 m)    Wt 192 lb (87.1 kg)    BMI 35.12 kg/m     Laboratory Data: Lab Results  Component Value Date   WBC 7.9 03/17/2020   HGB 12.9 03/17/2020   HCT 38.8 03/17/2020   MCV 87.6 03/17/2020   PLT 259 03/17/2020    Lab Results  Component Value Date   CREATININE 1.21 (H) 03/18/2020    No results found for: PSA  No results found for: TESTOSTERONE  Lab Results  Component Value Date   HGBA1C 7.9 (H) 02/05/2020    Urinalysis    Component Value Date/Time   COLORURINE YELLOW 11/05/2013  [place] 11/05/2013 1205   LABSPEC  1.020 11/05/2013 1205   PHURINE 5.5 11/05/2013 1205   GLUCOSEU >1000 (A) 11/05/2013 1205   HGBUR NEGATIVE 11/05/2013 1205   BILIRUBINUR negative 05/20/2020 1416   KETONESUR 15 (A) 11/05/2013 1205   PROTEINUR Positive (A) 05/20/2020 1416   PROTEINUR NEGATIVE 11/05/2013 1205   UROBILINOGEN 0.2 05/20/2020 1416   UROBILINOGEN 0.2 11/05/2013 1205   NITRITE negative 05/20/2020 1416   NITRITE NEGATIVE 11/05/2013 1205   LEUKOCYTESUR Negative 05/20/2020 1416    Lab Results  Component Value Date   LABMICR 25.2 11/22/2017   BACTERIA neg 06/17/2015    Pertinent Imaging: CT stone study 01/14/2020: Images reviewed and discussed with patient Results for orders placed during the hospital encounter of 05/26/14  DG Abd 1 View   Narrative CLINICAL DATA:  Renal calculus  EXAM: ABDOMEN - 1 VIEW  COMPARISON:  November 20, 2013.  FINDINGS: There is fairly extensive stool throughout the colon. The overall bowel gas pattern is unremarkable without obstruction or free air. There is a 3 mm calcification in the mid right pelvis which is an on the previous study and probably represents a phlebolith. No other abnormal calcifications are identified. There are surgical clips in the gallbladder fossa region.  IMPRESSION: There is a 3 mm calcification in the lateral right mid pelvis which is stable compared to the prior study and may well represent a levo. The no other abnormal calcifications are identified. Note that stool in the colon could obscure small renal or ureteral calculi. There is fairly diffuse stool throughout the colon.   Electronically Signed   By: Lowella Grip M.D.   On: 05/26/2014 14:25    No results found for this or any previous visit. No results found for this or any previous visit. No results found for this or any previous visit. No results found for this or any previous visit. No results found for this or any previous visit. No results found for this or any previous  visit. Results for orders placed during the hospital encounter of 01/14/20  CT RENAL STONE STUDY   Narrative CLINICAL DATA:  Evaluate for urinary tract calculi. Right-sided abdominal pain for 6 months  EXAM: CT ABDOMEN AND PELVIS WITHOUT CONTRAST  TECHNIQUE: Multidetector CT imaging of the abdomen and pelvis was performed following the standard protocol without IV contrast.  COMPARISON:  09/25/2012.  FINDINGS: Lower chest: No acute abnormality.  Hepatobiliary: No focal liver abnormality is seen. Status post cholecystectomy. No biliary dilatation.  Pancreas: Unremarkable. No pancreatic ductal dilatation or surrounding inflammatory changes.  Spleen: Normal in size without focal abnormality.  Adrenals/Urinary Tract: Normal appearance of the adrenal glands.  Right upper pole renal calculus measures 3 mm, image 67/5. No hydronephrosis identified bilaterally. Urinary bladder is unremarkable. No bladder calculi identified.  Stomach/Bowel: Stomach is normal. The small bowel loops have a normal course and caliber without obstruction. The appendix is visualized and appears normal  Vascular/Lymphatic: Aortic atherosclerosis. No aneurysm. No abdominopelvic adenopathy.  Reproductive: Uterus and bilateral adnexa are unremarkable.  Other: No free fluid or fluid collections identified.  Musculoskeletal: Lumbar degenerative disc disease identified. Chronic calcified posterior disc herniation.  IMPRESSION: 1. No acute findings within the abdomen or pelvis. 2. Nonobstructing right upper pole renal calculus.  Aortic Atherosclerosis (ICD10-I70.0).   Electronically Signed   By: Kerby Moors M.D.   On: 01/14/2020 13:53    UA today is unremarkable.   Assessment & Plan:    1. Chronic  cystitis with hematuria -UA clear today.  -We will continue the  trimethoprim 150m qhs for another 3 months and then give a drug holiday.  -RTC 3 months with UA She is not a candidate for  topical estrogen because of her CVA history.     2. Nephrolithiasis -observation -RTC 9 month with renal [place]  Return in about 3 months (around 08/20/2020).  JIrine Seal MD  [facility] [place]

## 2020-05-18 ENCOUNTER — Encounter (HOSPITAL_COMMUNITY): Payer: PPO

## 2020-05-18 ENCOUNTER — Other Ambulatory Visit (HOSPITAL_COMMUNITY): Payer: PPO

## 2020-05-18 ENCOUNTER — Other Ambulatory Visit: Payer: Self-pay | Admitting: *Deleted

## 2020-05-18 MED ORDER — CLOPIDOGREL BISULFATE 75 MG PO TABS: ORAL_TABLET | ORAL | 0 refills | Status: DC

## 2020-05-20 ENCOUNTER — Other Ambulatory Visit: Payer: Self-pay

## 2020-05-20 ENCOUNTER — Ambulatory Visit (INDEPENDENT_AMBULATORY_CARE_PROVIDER_SITE_OTHER): Payer: PPO | Admitting: Urology

## 2020-05-20 VITALS — BP 96/62 | HR 81 | Temp 97.3°F | Ht 62.0 in | Wt 192.0 lb

## 2020-05-20 DIAGNOSIS — Z79899 Other long term (current) drug therapy: Secondary | ICD-10-CM | POA: Diagnosis not present

## 2020-05-20 DIAGNOSIS — N390 Urinary tract infection, site not specified: Secondary | ICD-10-CM | POA: Diagnosis not present

## 2020-05-20 DIAGNOSIS — E1159 Type 2 diabetes mellitus with other circulatory complications: Secondary | ICD-10-CM | POA: Diagnosis not present

## 2020-05-20 LAB — POCT URINALYSIS DIPSTICK
Bilirubin, UA: NEGATIVE
Blood, UA: NEGATIVE
Glucose, UA: NEGATIVE
Ketones, UA: NEGATIVE
Leukocytes, UA: NEGATIVE
Nitrite, UA: NEGATIVE
Protein, UA: POSITIVE — AB
Spec Grav, UA: 1.025 (ref 1.010–1.025)
Urobilinogen, UA: 0.2 E.U./dL
pH, UA: 5 (ref 5.0–8.0)

## 2020-05-20 NOTE — Progress Notes (Signed)
Urological Symptom Review  Patient is experiencing the following symptoms: Frequent urination Burning/pain with urination Get up at night to urinate Leakage of urine Have to strain to urinate Urinary tract infection Painful intercourse Kidney stones  Review of Systems  Gastrointestinal (upper)  : Negative for upper GI symptoms  Gastrointestinal (lower) : Negative for lower GI symptoms  Constitutional : Negative for symptoms  Skin: Negative for skin symptoms  Eyes: Negative for eye symptoms  Ear/Nose/Throat : Negative for Ear/Nose/Throat symptoms  Hematologic/Lymphatic: Negative for Hematologic/Lymphatic symptoms  Cardiovascular : Negative for cardiovascular symptoms  Respiratory : Shortness of breath  Endocrine: Negative for endocrine symptoms  Musculoskeletal: Back pain  Neurological: Negative for neurological symptoms  Psychologic: Depression

## 2020-05-21 LAB — BASIC METABOLIC PANEL
BUN: 11 mg/dL (ref 7–25)
CO2: 29 mmol/L (ref 20–32)
Calcium: 9.5 mg/dL (ref 8.6–10.4)
Chloride: 104 mmol/L (ref 98–110)
Creat: 0.95 mg/dL (ref 0.50–0.99)
Glucose, Bld: 257 mg/dL — ABNORMAL HIGH (ref 65–139)
Potassium: 4.2 mmol/L (ref 3.5–5.3)
Sodium: 138 mmol/L (ref 135–146)

## 2020-05-21 LAB — HEMOGLOBIN A1C
Hgb A1c MFr Bld: 6.8 % of total Hgb — ABNORMAL HIGH (ref <5.7)
Mean Plasma Glucose: 148 (calc)
eAG (mmol/L): 8.2 (calc)

## 2020-05-26 ENCOUNTER — Other Ambulatory Visit: Payer: Self-pay

## 2020-05-26 ENCOUNTER — Encounter (HOSPITAL_COMMUNITY)
Admission: RE | Admit: 2020-05-26 | Discharge: 2020-05-26 | Disposition: A | Discharge status: Home / Self Care | Payer: PPO | Source: Ambulatory Visit | Attending: Cardiovascular Disease | Admitting: Cardiovascular Disease

## 2020-05-26 ENCOUNTER — Ambulatory Visit (HOSPITAL_COMMUNITY)
Admission: RE | Admit: 2020-05-26 | Discharge: 2020-05-26 | Disposition: A | Discharge status: Home / Self Care | Payer: PPO | Source: Ambulatory Visit | Attending: Cardiovascular Disease | Admitting: Cardiovascular Disease

## 2020-05-26 DIAGNOSIS — R0600 Dyspnea, unspecified: Secondary | ICD-10-CM | POA: Insufficient documentation

## 2020-05-26 LAB — NM MYOCAR MULTI W/SPECT W/WALL MOTION / EF
LV dias vol: 69 mL (ref 46–106)
LV sys vol: 12 mL
Peak HR: 100 {beats}/min
RATE: 0.43
Rest HR: 79 {beats}/min
SDS: 0
SRS: 2
SSS: 2
TID: 1.14

## 2020-05-26 MED ORDER — SODIUM CHLORIDE FLUSH 0.9 % IV SOLN
INTRAVENOUS | Status: AC
  Administered 2020-05-26: 10 mL via INTRAVENOUS
  Filled 2020-05-26: qty 10

## 2020-05-26 MED ORDER — TECHNETIUM TC 99M TETROFOSMIN IV KIT
30.0000 | PACK | Freq: Once | INTRAVENOUS | Status: AC | PRN
  Administered 2020-05-26: 30 via INTRAVENOUS

## 2020-05-26 MED ORDER — REGADENOSON 0.4 MG/5ML IV SOLN
INTRAVENOUS | Status: AC
  Administered 2020-05-26: 0.4 mg via INTRAVENOUS
  Filled 2020-05-26: qty 5

## 2020-05-26 MED ORDER — TECHNETIUM TC 99M TETROFOSMIN IV KIT
10.0000 | PACK | Freq: Once | INTRAVENOUS | Status: AC | PRN
  Administered 2020-05-26: 11 via INTRAVENOUS

## 2020-05-27 ENCOUNTER — Encounter: Payer: Self-pay | Admitting: Urology

## 2020-05-27 ENCOUNTER — Telehealth: Payer: Self-pay | Admitting: Urology

## 2020-05-27 NOTE — Telephone Encounter (Signed)
Please advise 

## 2020-05-27 NOTE — Telephone Encounter (Signed)
[facility] called regarding Trimethoprim 100mg  for this pt. They cannot get it any longer and want to see if it can be changed to something else?

## 2020-05-30 NOTE — Telephone Encounter (Signed)
Per [place]- Trimethoprim is no longer being made. I can call around but he said pharmacies are no longer able to get that medication. Any alternatives you would like to try?

## 2020-05-30 NOTE — Telephone Encounter (Signed)
Could you please check with some other pharmacies in the area about the TMP.  She has done well on it and I would rather not change unless absolutely necessary.

## 2020-05-31 NOTE — Telephone Encounter (Signed)
We can have her use bactrim ds 1/2 tab po qhs #30 with 3 refills.

## 2020-05-31 NOTE — Telephone Encounter (Signed)
Byetta 10 Mcg Pen [exenatide] Medium Diarrhea, Nausea And Vomiting   touch of pancreatis  Naproxen Medium Nausea And Vomiting   Pollen Extract Not Specified    Augmentin [amoxicillin-pot Clavulanate] Low  diarrhea  Azithromycin Low Nausea And Vomiting, Rash   Erythromycin Low Hives    Invokana [canagliflozin] Low  Urinary issues,yeast infection  Morphine Low Hives, Rash   Sulfonamide Derivatives Low Nausea And Vomiting, Rash     Please see patient allergies.

## 2020-05-31 NOTE — Telephone Encounter (Signed)
Please see allergies below

## 2020-06-01 ENCOUNTER — Other Ambulatory Visit: Payer: Self-pay

## 2020-06-01 MED ORDER — CEPHALEXIN 250 MG PO CAPS: 250.0000 mg | ORAL_CAPSULE | Freq: Every evening | ORAL | 5 refills | Status: DC

## 2020-06-01 NOTE — Telephone Encounter (Signed)
Prescription sent in . My chart message sent to pt.

## 2020-06-01 NOTE — Telephone Encounter (Signed)
Let's go with keflex 250mg  po qhs #30 with 5 refills.   She had a reaction to augmentin but it was just intolerance so the keflex should be ok.

## 2020-06-23 ENCOUNTER — Ambulatory Visit (INDEPENDENT_AMBULATORY_CARE_PROVIDER_SITE_OTHER): Payer: PPO | Admitting: Family Medicine

## 2020-06-23 ENCOUNTER — Other Ambulatory Visit: Payer: Self-pay

## 2020-06-23 ENCOUNTER — Encounter: Payer: Self-pay | Admitting: Family Medicine

## 2020-06-23 VITALS — BP 120/70 | HR 88 | Temp 97.6°F | Wt 198.6 lb

## 2020-06-23 DIAGNOSIS — I10 Essential (primary) hypertension: Secondary | ICD-10-CM | POA: Diagnosis not present

## 2020-06-23 DIAGNOSIS — E1159 Type 2 diabetes mellitus with other circulatory complications: Secondary | ICD-10-CM

## 2020-06-23 DIAGNOSIS — G894 Chronic pain syndrome: Secondary | ICD-10-CM | POA: Diagnosis not present

## 2020-06-23 MED ORDER — HYDROCODONE-ACETAMINOPHEN 7.5-325 MG PO TABS: 1.0000 | ORAL_TABLET | Freq: Four times a day (QID) | ORAL | 0 refills | Status: DC | PRN

## 2020-06-23 MED ORDER — HYDROCODONE-ACETAMINOPHEN 7.5-325 MG PO TABS: ORAL_TABLET | ORAL | 0 refills | Status: DC

## 2020-06-23 MED ORDER — BUSPIRONE HCL 10 MG PO TABS: ORAL_TABLET | ORAL | 0 refills | Status: DC

## 2020-06-23 MED ORDER — AMLODIPINE BESYLATE 5 MG PO TABS: 5.0000 mg | ORAL_TABLET | Freq: Every day | ORAL | 5 refills | Status: DC

## 2020-06-23 NOTE — Progress Notes (Signed)
Subjective:    Patient ID: Jamie Harding, female    DOB: 14-Aug-1956, 64 y.o.   MRN: [id number]  HPI Patient comes in today for follow up.  Patient reports continued dizzy spells and feeling unstable on her feet.  Results for orders placed or performed during the hospital encounter of 05/26/20  NM Myocar Multi W/Spect W/Wall Motion / EF  Result Value Ref Range   Rest HR 79 bpm   Rest BP 115/64 mmHg   Peak HR 100 bpm   Peak BP 115/64 mmHg   SSS 2    SRS 2    SDS 0    LHR 0.43    TID 1.14    LV sys vol 12 mL   LV dias vol 69 46 - 106 mL   This patient was seen today for chronic pain  The medication list was reviewed and updated.   -Compliance with medication: Compliant with medicine  - Number patient states they take daily: 4/day  -when was the last dose patient took?  Earlier today  The patient was advised the importance of maintaining medication and not using illegal substances with these.  Here for refills and follow up  The patient was educated that we can provide 3 monthly scripts for their medication, it is their responsibility to follow the instructions.  Side effects or complications from medications: Denies side effects  Patient is aware that pain medications are meant to minimize the severity of the pain to allow their pain levels to improve to allow for better function. They are aware of that pain medications cannot totally remove their pain.  Due for UDT ( at least once per year) : Had one earlier this year  Scale of 1 to 10 ( 1 is least 10 is most) Your pain level without the medicine: 8 Your pain level with medication 3  Scale 1 to 10 ( 1-helps very little, 10 helps very well) How well does your pain medication reduce your pain so you can function better through out the day?  7       Review of Systems  Constitutional: Negative for activity change, appetite change and fatigue.  HENT: Negative for congestion and rhinorrhea.   Respiratory:  Negative for cough and shortness of breath.   Cardiovascular: Negative for chest pain and leg swelling.  Gastrointestinal: Negative for abdominal pain and diarrhea.  Endocrine: Negative for polydipsia and polyphagia.  Skin: Negative for color change.  Neurological: Positive for dizziness. Negative for weakness.  Psychiatric/Behavioral: Negative for behavioral problems and confusion.       Objective:   Physical Exam Vitals reviewed.  Constitutional:      General: She is not in acute distress. HENT:     Head: Normocephalic and atraumatic.  Eyes:     General:        Right eye: No discharge.        Left eye: No discharge.  Neck:     Trachea: No tracheal deviation.  Cardiovascular:     Rate and Rhythm: Normal rate and regular rhythm.     Heart sounds: Normal heart sounds. No murmur heard.   Pulmonary:     Effort: Pulmonary effort is normal. No respiratory distress.     Breath sounds: Normal breath sounds.  Lymphadenopathy:     Cervical: No cervical adenopathy.  Skin:    General: Skin is warm and dry.  Neurological:     Mental Status: She is alert.     Coordination:  Coordination normal.  Psychiatric:        Behavior: Behavior normal.     Encourage patient to get Covid vaccine      Assessment & Plan:  Orthostatic hypotension related to medicine reduce amlodipine to 5 mg The patient was seen in followup for chronic pain. A review over at their current pain status was discussed. Drug registry was checked. Prescriptions were given.  Regular follow-up recommended. Discussion was held regarding the importance of compliance with medication as well as pain medication contract.  Patient was informed that medication may cause drowsiness and should not be combined  with other medications/alcohol or street drugs. If the patient feels medication is causing altered alertness then do not drive or operate dangerous equipment.  Patient to do follow-up in the fall

## 2020-06-28 ENCOUNTER — Other Ambulatory Visit: Payer: Self-pay | Admitting: Family Medicine

## 2020-07-11 ENCOUNTER — Encounter: Payer: Self-pay | Admitting: Family Medicine

## 2020-07-11 DIAGNOSIS — I6930 Unspecified sequelae of cerebral infarction: Secondary | ICD-10-CM | POA: Diagnosis not present

## 2020-07-11 DIAGNOSIS — R251 Tremor, unspecified: Secondary | ICD-10-CM | POA: Diagnosis not present

## 2020-07-11 DIAGNOSIS — I69393 Ataxia following cerebral infarction: Secondary | ICD-10-CM | POA: Diagnosis not present

## 2020-07-11 DIAGNOSIS — E1140 Type 2 diabetes mellitus with diabetic neuropathy, unspecified: Secondary | ICD-10-CM | POA: Diagnosis not present

## 2020-07-11 DIAGNOSIS — I10 Essential (primary) hypertension: Secondary | ICD-10-CM | POA: Diagnosis not present

## 2020-07-11 NOTE — Telephone Encounter (Signed)
Seen 06/23/20

## 2020-07-12 ENCOUNTER — Other Ambulatory Visit: Payer: Self-pay | Admitting: Family Medicine

## 2020-07-12 MED ORDER — BUSPIRONE HCL 10 MG PO TABS: ORAL_TABLET | ORAL | 3 refills | Status: DC

## 2020-07-14 ENCOUNTER — Encounter: Payer: Self-pay | Admitting: Family Medicine

## 2020-07-22 ENCOUNTER — Encounter: Payer: Self-pay | Admitting: Family Medicine

## 2020-07-22 ENCOUNTER — Telehealth (INDEPENDENT_AMBULATORY_CARE_PROVIDER_SITE_OTHER): Payer: PPO | Admitting: Family Medicine

## 2020-07-22 ENCOUNTER — Other Ambulatory Visit: Payer: Self-pay

## 2020-07-22 DIAGNOSIS — J0100 Acute maxillary sinusitis, unspecified: Secondary | ICD-10-CM

## 2020-07-22 MED ORDER — DOXYCYCLINE HYCLATE 100 MG PO TABS: 100.0000 mg | ORAL_TABLET | Freq: Two times a day (BID) | ORAL | 0 refills | Status: DC

## 2020-07-22 NOTE — Telephone Encounter (Signed)
Patient scheduled phone visit today with Santiago Glad NP

## 2020-07-22 NOTE — Progress Notes (Addendum)
Patient ID: Jamie Harding, female    DOB: [birth year], 64 y.o.   MRN: [id number]   Chief Complaint  Patient presents with  . Sinus Problem    sore throat and sinus sx for several days- granddaughter had similar illness recently   Subjective:    HPI Sore throat from sinus drainage and slight headache, Feels bad.  Medical History Gianne has a past medical history of Anxiety, Asthma, Depression, Essential hypertension, Fatty liver, Gastritis, Gastroparesis, GERD (gastroesophageal reflux disease), Hiatal hernia, History of cardiac catheterization, History of kidney stones, History of stroke (2008), HLD (hyperlipidemia), Neuropathy, Obesity, Pancreatitis, Rheumatoid arthritis(714.0), Stroke ([place]), Tibialis tendinitis, and Type 2 diabetes mellitus ([place]).   Outpatient Encounter Medications as of 07/22/2020  Medication Sig  . albuterol (VENTOLIN HFA) 108 (90 Base) MCG/ACT inhaler INHALE 2 PUFFS INTO THE LUNGS EVERY 4 (FOUR) HOURS AS NEEDED FOR WHEEZING.  Marland Kitchen amLODipine (NORVASC) 5 MG tablet Take 1 tablet (5 mg total) by mouth daily.  . Aspirin Buf,CaCarb-MgCarb-MgO, 81 MG TABS aspirin 81 mg tablet  Take 1 mg every day by oral route.  . blood glucose meter kit and supplies Dispense based on patient and insurance preference. Pt checking sugars four times a day . (FOR ICD-10 E10.9, E11.9).  . busPIRone (BUSPAR) 10 MG tablet Take 1 tablet BID PRN  . cephALEXin (KEFLEX) 250 MG capsule Take 1 capsule (250 mg total) by mouth at bedtime.  . cetirizine (ZYRTEC) 10 MG tablet TAKE ONE TABLET (10MG TOTAL) BY MOUTH DAILY  . clopidogrel (PLAVIX) 75 MG tablet TAKE ONE TABLET (75MG TOTAL) BY MOUTH DAILY WITH BREAKFAST  . doxycycline (VIBRA-TABS) 100 MG tablet Take 1 tablet (100 mg total) by mouth 2 (two) times daily.  . fluticasone (FLONASE) 50 MCG/ACT nasal spray Place 2 sprays into both nostrils daily.  Marland Kitchen HYDROcodone-acetaminophen (NORCO) 7.5-325 MG tablet Take one tablet po every 6 hrs prn moderate pain  .  HYDROcodone-acetaminophen (NORCO) 7.5-325 MG tablet Take 1 tablet by mouth every 6 (six) hours as needed for moderate pain.  Marland Kitchen HYDROcodone-acetaminophen (NORCO) 7.5-325 MG tablet Take 1 tablet by mouth every 6 (six) hours as needed for moderate pain.  Marland Kitchen insulin detemir (LEVEMIR) 100 UNIT/ML injection 70 units in the am and 60 units qhs 70 units in the am and 60 units qhs  . lisinopril (ZESTRIL) 20 MG tablet TAKE ONE AND ONE-HALF TABLET (30MG TOTAL) BY MOUTH DAILY  . meclizine (ANTIVERT) 25 MG tablet Take 1 tablet (25 mg total) by mouth 3 (three) times daily as needed for dizziness.  . metFORMIN (GLUCOPHAGE) 1000 MG tablet TAKE 1 TABLET (1000 MG TOTAL) BY MOUTH 2TIMES A DAY.  . Multiple Vitamin (MULTIVITAMIN WITH MINERALS) TABS Take 1 tablet by mouth daily.  . mupirocin ointment (BACTROBAN) 2 % Apply 1 application topically 2 (two) times daily.  Marland Kitchen NOVOLOG FLEXPEN 100 UNIT/ML FlexPen Inject 22 Units into the skin 3 (three) times daily with meals. (Patient taking differently: Inject 15-20 Units into the skin 3 (three) times daily with meals. )  . ondansetron (ZOFRAN) 8 MG tablet TAKE ONE TABLET BY MOUTH EVERY 12 HOURS AS NEEDED FOR NAUSEA  . pantoprazole (PROTONIX) 40 MG tablet TAKE 1 TABLET (40 MG TOTAL) BY MOUTH DAILY.  Marland Kitchen promethazine (PHENERGAN) 25 MG tablet TAKE ONE TABLET BY MOUTH EVERY EIGHT HOURS AS NEEDED FOR NAUSEA  . rosuvastatin (CRESTOR) 20 MG tablet TAKE ONE TABLET BY MOUTH ONCE DAILY.  . sitaGLIPtin (JANUVIA) 100 MG tablet Take 1 tablet (100 mg total) by  mouth daily.  Marland Kitchen triamcinolone cream (KENALOG) 0.1 % Apply BID prn  . venlafaxine (EFFEXOR) 75 MG tablet TAKE ONE (1) TABLET BY MOUTH 3 TIMES DAILY   No facility-administered encounter medications on file as of 07/22/2020.     Review of Systems  Constitutional: Negative for chills and fever.  HENT: Positive for sinus pressure, sinus pain and sore throat.   Eyes: Negative.   Respiratory: Negative for cough and shortness of breath.     Cardiovascular: Negative.      Vitals There were no vitals taken for this visit.  This was a phone visit. Denies fever.  Objective:   Physical Exam Unable, this was a phone visit. Patient reports facial tenderness and pain similar to previous sinus infection. Virtual Visit via Telephone Note  I connected with Bevelyn A Hilyer on 33/58/25 at  2:30 PM EDT by telephone and verified that I am speaking with the correct person using two identifiers.  Location: Patient: home Provider: office   I discussed the limitations, risks, security and privacy concerns of performing an evaluation and management service by telephone and the availability of in person appointments. I also discussed with the patient that there may be a patient responsible charge related to this service. The patient expressed understanding and agreed to proceed.   History of Present Illness:    Observations/Objective:   Assessment and Plan:   Follow Up Instructions:    I discussed the assessment and treatment plan with the patient. The patient was provided an opportunity to ask questions and all were answered. The patient agreed with the plan and demonstrated an understanding of the instructions.   The patient was advised to call back or seek an in-person evaluation if the symptoms worsen or if the condition fails to improve as anticipated.  I provided 15 minutes of non-face-to-face time during this encounter.   Chalmers Guest, NP   Assessment and Plan   1. Acute non-recurrent maxillary sinusitis - doxycycline (VIBRA-TABS) 100 MG tablet; Take 1 tablet (100 mg total) by mouth 2 (two) times daily.  Dispense: 20 tablet; Refill: 0   This is a new problem.  Started yesterday. Associated symptoms include: sore throat from sinus drainage, slight headache, tenderness to cheeks and forehead.  Pertinent negatives include: no fever, no cough,   Treatments tried: nothing.  Today: feels bad with slight  headache and facial tenderness. No fever.  Will start Doxy 100 mg BID for sinusitis. She has had sinus infection before and this feels the same. Reports she has tolerated Doxy in the past (due to multiple allergies).   Agrees with plan of care discussed today. Understands warning signs to seek further care: fever and increased pain or illness. Understands to follow-up if symptoms do not improve or if anything changes.  Pecolia Ades, FNP-C

## 2020-07-22 NOTE — Telephone Encounter (Signed)
Nurses May utilize saline nasal spray Avoid decongestants Recommend virtual visit with Santiago Glad today

## 2020-08-01 ENCOUNTER — Other Ambulatory Visit: Payer: Self-pay | Admitting: Family Medicine

## 2020-08-01 NOTE — Addendum Note (Signed)
Addended by: Pecolia Ades R on: 08/01/2020 11:36 AM   Modules accepted: Level of Service

## 2020-08-08 ENCOUNTER — Encounter: Payer: Self-pay | Admitting: Family Medicine

## 2020-08-09 ENCOUNTER — Ambulatory Visit (INDEPENDENT_AMBULATORY_CARE_PROVIDER_SITE_OTHER): Payer: PPO | Admitting: Family Medicine

## 2020-08-09 ENCOUNTER — Other Ambulatory Visit: Payer: Self-pay

## 2020-08-09 DIAGNOSIS — R05 Cough: Secondary | ICD-10-CM | POA: Diagnosis not present

## 2020-08-09 DIAGNOSIS — J019 Acute sinusitis, unspecified: Secondary | ICD-10-CM | POA: Diagnosis not present

## 2020-08-09 DIAGNOSIS — R059 Cough, unspecified: Secondary | ICD-10-CM

## 2020-08-09 MED ORDER — CEFPROZIL 500 MG PO TABS: 500.0000 mg | ORAL_TABLET | Freq: Two times a day (BID) | ORAL | 0 refills | Status: DC

## 2020-08-09 NOTE — Progress Notes (Signed)
° °  Subjective:    Patient ID: Jamie Harding, female    DOB: October 09, 1956, 64 y.o.   MRN: [id number]  Sinusitis This is a new problem. Episode onset: 3 weeks. Associated symptoms include congestion, headaches and sinus pressure. Treatments tried: Doxycyline, mucinex.  Patient with significant head congestion drainage coughing denies high fever chills sweats wheezing difficulty breathing    Review of Systems  HENT: Positive for congestion and sinus pressure.   Neurological: Positive for headaches.       Objective:   Physical Exam Mild sinus tenderness eardrums normal neck supple lungs clear  I doubt that this is Covid but Covid test was taken     Assessment & Plan:  Sinusitis Antibiotic prescribed Warning signs discussed Follow-up if problems

## 2020-08-11 LAB — SARS-COV-2, NAA 2 DAY TAT

## 2020-08-11 LAB — NOVEL CORONAVIRUS, NAA: SARS-CoV-2, NAA: NOT DETECTED

## 2020-08-11 LAB — SPECIMEN STATUS REPORT

## 2020-08-12 ENCOUNTER — Encounter: Payer: Self-pay | Admitting: Family Medicine

## 2020-08-25 NOTE — Progress Notes (Signed)
 08/26/2020 2:58 PM   Faydra A Kelleher 08/14/1956 [id number]  Referring provider: Luking, Scott A, MD [address] [place],  [place] [address]  Dysuria  HPI: Ms Zobel is a 64yo here for followup for nephrolithiasis and recurrent UTIs.   She was started on TMP 100mg nightly for suppression but was changed to cephalexin nightly and has done well with only one breakthrough infection since her last visit on 05/20/20.   UA today is clear but concentrated.  She has occasional dysuria.  She has no urgency or incontinence.   She has a history of stones but no worrisome symptoms.  She has had no hematuria.   PMH: Past Medical History:  Diagnosis Date  . Anxiety   . Asthma   . Depression   . Essential hypertension   . Fatty liver   . Gastritis   . Gastroparesis   . GERD (gastroesophageal reflux disease)   . Hiatal hernia   . History of cardiac catheterization    Minimal coronary atherosclerosis February 2016  . History of kidney stones   . History of stroke 2008  . HLD (hyperlipidemia)   . Neuropathy   . Obesity   . Pancreatitis   . Rheumatoid arthritis(714.0)   . Stroke ([facility])   . Tibialis tendinitis   . Type 2 diabetes mellitus ([facility])     Surgical History: Past Surgical History:  Procedure Laterality Date  . ANKLE SURGERY     remove extra bone-left  . CARPAL TUNNEL RELEASE     right side  . CARPAL TUNNEL RELEASE Left 02/06/2013   Procedure: CARPAL TUNNEL RELEASE ;  Surgeon: Stanley E Harrison, MD;  Location: [facility];  Service: Orthopedics;  Laterality: Left;  procedure end 1135  . CARPAL TUNNEL RELEASE Right 09/14/2015   Procedure: RIGHT CARPAL TUNNEL RELEASE;  Surgeon: Stanley E Harrison, MD;  Location: [facility];  Service: Orthopedics;  Laterality: Right;  . CARPAL TUNNEL RELEASE Left 09/28/2015   Procedure: LEFT CARPAL TUNNEL RELEASE;  Surgeon: Stanley E Harrison, MD;  Location: [facility];  Service: Orthopedics;  Laterality: Left;  procedure 1  . CESAREAN SECTION    .  CHOLECYSTECTOMY    . COLONOSCOPY WITH PROPOFOL  09/16/2012   SLF:Five sessile polyps ranging between 3-5mm in size were found in the sigmoid colon and rectum; polypectomy was performed/Mild diverticulosis was noted throughout the entire examined colon/Small internal hemorrhoids  . DILATATION & CURETTAGE/HYSTEROSCOPY WITH MYOSURE N/A 08/05/2019   Procedure: DILATATION & CURETTAGE/HYSTEROSCOPY WITH MYOSURE;  Surgeon: Davis, Kelly M, MD;  Location: [facility];  Service: Gynecology;  Laterality: N/A;  myosure rep will be here.  Confirmed on 07/30/19 CS  . DILATION AND CURETTAGE OF UTERUS     multiple  . ESOPHAGOGASTRODUODENOSCOPY  07/29/2009   Mild gastritis, benign path  . ESOPHAGOGASTRODUODENOSCOPY (EGD) WITH PROPOFOL  09/16/2012   SLF:Non-erosive gastritis (inflammation) was found; multiple bx/The duodenal mucosa showed no abnormalities in the ampulla and bulb and second portion of the duodenum/NAUSEA/VOMITING MOST LIKELY DUE TO GERD/GASTRITIS  . FINGER SURGERY Left    left little finger-otif of finger  . Gastric Emptying  07/27/2009   The amount of activity in the stomach at 120 minutes was 13% which is in the normal range  . LASER ABLATION OF THE CERVIX    . LEFT AND RIGHT HEART CATHETERIZATION WITH CORONARY ANGIOGRAM N/A 01/03/2015   Procedure: LEFT AND RIGHT HEART CATHETERIZATION WITH CORONARY ANGIOGRAM;  Surgeon: Michael D Cooper, MD;  [facility]: [facility]    08/26/2020 2:58 PM   Faydra A Kelleher 08/14/1956 [id number]  Referring provider: Luking, Scott A, MD [address] [place],  [place] [address]  Dysuria  HPI: Ms Zobel is a 64yo here for followup for nephrolithiasis and recurrent UTIs.   She was started on TMP 100mg nightly for suppression but was changed to cephalexin nightly and has done well with only one breakthrough infection since her last visit on 05/20/20.   UA today is clear but concentrated.  She has occasional dysuria.  She has no urgency or incontinence.   She has a history of stones but no worrisome symptoms.  She has had no hematuria.   PMH: Past Medical History:  Diagnosis Date  . Anxiety   . Asthma   . Depression   . Essential hypertension   . Fatty liver   . Gastritis   . Gastroparesis   . GERD (gastroesophageal reflux disease)   . Hiatal hernia   . History of cardiac catheterization    Minimal coronary atherosclerosis February 2016  . History of kidney stones   . History of stroke 2008  . HLD (hyperlipidemia)   . Neuropathy   . Obesity   . Pancreatitis   . Rheumatoid arthritis(714.0)   . Stroke ([facility])   . Tibialis tendinitis   . Type 2 diabetes mellitus ([facility])     Surgical History: Past Surgical History:  Procedure Laterality Date  . ANKLE SURGERY     remove extra bone-left  . CARPAL TUNNEL RELEASE     right side  . CARPAL TUNNEL RELEASE Left 02/06/2013   Procedure: CARPAL TUNNEL RELEASE ;  Surgeon: Stanley E Harrison, MD;  Location: [facility];  Service: Orthopedics;  Laterality: Left;  procedure end 1135  . CARPAL TUNNEL RELEASE Right 09/14/2015   Procedure: RIGHT CARPAL TUNNEL RELEASE;  Surgeon: Stanley E Harrison, MD;  Location: [facility];  Service: Orthopedics;  Laterality: Right;  . CARPAL TUNNEL RELEASE Left 09/28/2015   Procedure: LEFT CARPAL TUNNEL RELEASE;  Surgeon: Stanley E Harrison, MD;  Location: [facility];  Service: Orthopedics;  Laterality: Left;  procedure 1  . CESAREAN SECTION    .  CHOLECYSTECTOMY    . COLONOSCOPY WITH PROPOFOL  09/16/2012   SLF:Five sessile polyps ranging between 3-5mm in size were found in the sigmoid colon and rectum; polypectomy was performed/Mild diverticulosis was noted throughout the entire examined colon/Small internal hemorrhoids  . DILATATION & CURETTAGE/HYSTEROSCOPY WITH MYOSURE N/A 08/05/2019   Procedure: DILATATION & CURETTAGE/HYSTEROSCOPY WITH MYOSURE;  Surgeon: Davis, Kelly M, MD;  Location: [facility];  Service: Gynecology;  Laterality: N/A;  myosure rep will be here.  Confirmed on 07/30/19 CS  . DILATION AND CURETTAGE OF UTERUS     multiple  . ESOPHAGOGASTRODUODENOSCOPY  07/29/2009   Mild gastritis, benign path  . ESOPHAGOGASTRODUODENOSCOPY (EGD) WITH PROPOFOL  09/16/2012   SLF:Non-erosive gastritis (inflammation) was found; multiple bx/The duodenal mucosa showed no abnormalities in the ampulla and bulb and second portion of the duodenum/NAUSEA/VOMITING MOST LIKELY DUE TO GERD/GASTRITIS  . FINGER SURGERY Left    left little finger-otif of finger  . Gastric Emptying  07/27/2009   The amount of activity in the stomach at 120 minutes was 13% which is in the normal range  . LASER ABLATION OF THE CERVIX    . LEFT AND RIGHT HEART CATHETERIZATION WITH CORONARY ANGIOGRAM N/A 01/03/2015   Procedure: LEFT AND RIGHT HEART CATHETERIZATION WITH CORONARY ANGIOGRAM;  Surgeon: Michael D Cooper, MD;  [facility]: [facility]    08/26/2020 2:58 PM   Faydra A Kelleher 08/14/1956 [id number]  Referring provider: Luking, Scott A, MD [address] [place],  [place] [address]  Dysuria  HPI: Ms Zobel is a 64yo here for followup for nephrolithiasis and recurrent UTIs.   She was started on TMP 100mg nightly for suppression but was changed to cephalexin nightly and has done well with only one breakthrough infection since her last visit on 05/20/20.   UA today is clear but concentrated.  She has occasional dysuria.  She has no urgency or incontinence.   She has a history of stones but no worrisome symptoms.  She has had no hematuria.   PMH: Past Medical History:  Diagnosis Date  . Anxiety   . Asthma   . Depression   . Essential hypertension   . Fatty liver   . Gastritis   . Gastroparesis   . GERD (gastroesophageal reflux disease)   . Hiatal hernia   . History of cardiac catheterization    Minimal coronary atherosclerosis February 2016  . History of kidney stones   . History of stroke 2008  . HLD (hyperlipidemia)   . Neuropathy   . Obesity   . Pancreatitis   . Rheumatoid arthritis(714.0)   . Stroke ([facility])   . Tibialis tendinitis   . Type 2 diabetes mellitus ([facility])     Surgical History: Past Surgical History:  Procedure Laterality Date  . ANKLE SURGERY     remove extra bone-left  . CARPAL TUNNEL RELEASE     right side  . CARPAL TUNNEL RELEASE Left 02/06/2013   Procedure: CARPAL TUNNEL RELEASE ;  Surgeon: Stanley E Harrison, MD;  Location: [facility];  Service: Orthopedics;  Laterality: Left;  procedure end 1135  . CARPAL TUNNEL RELEASE Right 09/14/2015   Procedure: RIGHT CARPAL TUNNEL RELEASE;  Surgeon: Stanley E Harrison, MD;  Location: [facility];  Service: Orthopedics;  Laterality: Right;  . CARPAL TUNNEL RELEASE Left 09/28/2015   Procedure: LEFT CARPAL TUNNEL RELEASE;  Surgeon: Stanley E Harrison, MD;  Location: [facility];  Service: Orthopedics;  Laterality: Left;  procedure 1  . CESAREAN SECTION    .  CHOLECYSTECTOMY    . COLONOSCOPY WITH PROPOFOL  09/16/2012   SLF:Five sessile polyps ranging between 3-5mm in size were found in the sigmoid colon and rectum; polypectomy was performed/Mild diverticulosis was noted throughout the entire examined colon/Small internal hemorrhoids  . DILATATION & CURETTAGE/HYSTEROSCOPY WITH MYOSURE N/A 08/05/2019   Procedure: DILATATION & CURETTAGE/HYSTEROSCOPY WITH MYOSURE;  Surgeon: Davis, Kelly M, MD;  Location: [facility];  Service: Gynecology;  Laterality: N/A;  myosure rep will be here.  Confirmed on 07/30/19 CS  . DILATION AND CURETTAGE OF UTERUS     multiple  . ESOPHAGOGASTRODUODENOSCOPY  07/29/2009   Mild gastritis, benign path  . ESOPHAGOGASTRODUODENOSCOPY (EGD) WITH PROPOFOL  09/16/2012   SLF:Non-erosive gastritis (inflammation) was found; multiple bx/The duodenal mucosa showed no abnormalities in the ampulla and bulb and second portion of the duodenum/NAUSEA/VOMITING MOST LIKELY DUE TO GERD/GASTRITIS  . FINGER SURGERY Left    left little finger-otif of finger  . Gastric Emptying  07/27/2009   The amount of activity in the stomach at 120 minutes was 13% which is in the normal range  . LASER ABLATION OF THE CERVIX    . LEFT AND RIGHT HEART CATHETERIZATION WITH CORONARY ANGIOGRAM N/A 01/03/2015   Procedure: LEFT AND RIGHT HEART CATHETERIZATION WITH CORONARY ANGIOGRAM;  Surgeon: Michael D Cooper, MD;  Location: [facility]   09/73/5329 9:24 PM   DENALI SHARMA [birth year] [id number]  Referring provider: Kathyrn Drown, MD [address] [place],  [place] [address]  Dysuria  HPI: Ms Brownrigg is a 64yo here for followup for nephrolithiasis and recurrent UTIs.   She was started on TMP 145m nightly for suppression but was changed to cephalexin nightly and has done well with only one breakthrough infection since her last visit on 05/20/20.   UA today is clear but concentrated.  She has occasional dysuria.  She has no urgency or incontinence.   She has a history of stones but no worrisome symptoms.  She has had no hematuria.   PMH: Past Medical History:  Diagnosis Date  . Anxiety   . Asthma   . Depression   . Essential hypertension   . Fatty liver   . Gastritis   . Gastroparesis   . GERD (gastroesophageal reflux disease)   . Hiatal hernia   . History of cardiac catheterization    Minimal coronary atherosclerosis February 2016  . History of kidney stones   . History of stroke 2008  . HLD (hyperlipidemia)   . Neuropathy   . Obesity   . Pancreatitis   . Rheumatoid arthritis(714.0)   . [place]   . Tibialis tendinitis   . Type 2 diabetes mellitus ([facility]     Surgical History: Past Surgical History:  Procedure Laterality Date  . ANKLE SURGERY     remove extra bone-left  . CARPAL TUNNEL RELEASE     right side  . CARPAL TUNNEL RELEASE Left 02/06/2013   Procedure: CARPAL TUNNEL RELEASE ;  Surgeon: SCarole Civil MD;  [facility]: [facility];  Service: Orthopedics;  Laterality: Left;  procedure end 1135  . CARPAL TUNNEL RELEASE Right 09/14/2015   Procedure: RIGHT CARPAL TUNNEL RELEASE;  Surgeon: SCarole Civil MD;  [facility]: [facility];  Service: Orthopedics;  Laterality: Right;  . CARPAL TUNNEL RELEASE Left 09/28/2015   Procedure: LEFT CARPAL TUNNEL RELEASE;  Surgeon: SCarole Civil MD;  Location: [facility];  Service: Orthopedics;  Laterality: Left;  procedure 1  . CESAREAN SECTION    .  CHOLECYSTECTOMY    . COLONOSCOPY WITH PROPOFOL  09/16/2012   SERD:EYCXsessile polyps ranging between 3-527min size were found in the sigmoid colon and rectum; polypectomy was performed/Mild diverticulosis was noted throughout the entire examined colon/Small internal hemorrhoids  . DILATATION & CURETTAGE/HYSTEROSCOPY WITH MYOSURE N/A 08/05/2019   Procedure: DILATATION & CURETTAGE/HYSTEROSCOPY WITH MYOSURE;  Surgeon: DaSloan LeiterMD;  [facility] [facility] Service: [place];  Laterality: N/A;  myosure rep will be here.  Confirmed on 07/30/19 CS  . DILATION AND CURETTAGE OF UTERUS     multiple  . ESOPHAGOGASTRODUODENOSCOPY  07/29/2009   Mild gastritis, benign path  . ESOPHAGOGASTRODUODENOSCOPY (EGD) WITH PROPOFOL  09/16/2012   SLF:Non-erosive gastritis (inflammation) was found; multiple bx/The duodenal mucosa showed no abnormalities in the ampulla and bulb and second portion of the duodenum/NAUSEA/VOMITING MOST LIKELY DUE TO GERD/GASTRITIS  . FINGER SURGERY Left    left little finger-otif of finger  . Gastric Emptying  07/27/2009   The amount of activity in the stomach at 120 minutes was 13% which is in the normal range  . LASER ABLATION OF THE CERVIX    . LEFT AND RIGHT HEART CATHETERIZATION WITH CORONARY ANGIOGRAM N/A 01/03/2015   Procedure: LEFT AND RIGHT HEART CATHETERIZATION WITH CORONARY ANGIOGRAM;  Surgeon: MiBlane OharaMD;  [facility]  09/73/5329 9:24 PM   DENALI SHARMA [birth year] [id number]  Referring provider: Kathyrn Drown, MD [address] [place],  [place] [address]  Dysuria  HPI: Ms Brownrigg is a 64yo here for followup for nephrolithiasis and recurrent UTIs.   She was started on TMP 145m nightly for suppression but was changed to cephalexin nightly and has done well with only one breakthrough infection since her last visit on 05/20/20.   UA today is clear but concentrated.  She has occasional dysuria.  She has no urgency or incontinence.   She has a history of stones but no worrisome symptoms.  She has had no hematuria.   PMH: Past Medical History:  Diagnosis Date  . Anxiety   . Asthma   . Depression   . Essential hypertension   . Fatty liver   . Gastritis   . Gastroparesis   . GERD (gastroesophageal reflux disease)   . Hiatal hernia   . History of cardiac catheterization    Minimal coronary atherosclerosis February 2016  . History of kidney stones   . History of stroke 2008  . HLD (hyperlipidemia)   . Neuropathy   . Obesity   . Pancreatitis   . Rheumatoid arthritis(714.0)   . [place]   . Tibialis tendinitis   . Type 2 diabetes mellitus ([facility]     Surgical History: Past Surgical History:  Procedure Laterality Date  . ANKLE SURGERY     remove extra bone-left  . CARPAL TUNNEL RELEASE     right side  . CARPAL TUNNEL RELEASE Left 02/06/2013   Procedure: CARPAL TUNNEL RELEASE ;  Surgeon: SCarole Civil MD;  [facility];  Service: Orthopedics;  Laterality: Left;  procedure end 1135  . CARPAL TUNNEL RELEASE Right 09/14/2015   Procedure: RIGHT CARPAL TUNNEL RELEASE;  Surgeon: SCarole Civil MD;  [facility];  Service: Orthopedics;  Laterality: Right;  . CARPAL TUNNEL RELEASE Left 09/28/2015   Procedure: LEFT CARPAL TUNNEL RELEASE;  Surgeon: SCarole Civil MD;  [facility];  Service: Orthopedics;  Laterality: Left;  procedure 1  . CESAREAN SECTION    .  CHOLECYSTECTOMY    . COLONOSCOPY WITH PROPOFOL  09/16/2012   SERD:EYCXsessile polyps ranging between 3-527min size were found in the sigmoid colon and rectum; polypectomy was performed/Mild diverticulosis was noted throughout the entire examined colon/Small internal hemorrhoids  . DILATATION & CURETTAGE/HYSTEROSCOPY WITH MYOSURE N/A 08/05/2019   Procedure: DILATATION & CURETTAGE/HYSTEROSCOPY WITH MYOSURE;  Surgeon: DaSloan LeiterMD;  [place] [facility] Service: Gynecology;  Laterality: N/A;  myosure rep will be here.  Confirmed on 07/30/19 CS  . DILATION AND CURETTAGE OF UTERUS     multiple  . ESOPHAGOGASTRODUODENOSCOPY  07/29/2009   Mild gastritis, benign path  . ESOPHAGOGASTRODUODENOSCOPY (EGD) WITH PROPOFOL  09/16/2012   SLF:Non-erosive gastritis (inflammation) was found; multiple bx/The duodenal mucosa showed no abnormalities in the ampulla and bulb and second portion of the duodenum/NAUSEA/VOMITING MOST LIKELY DUE TO GERD/GASTRITIS  . FINGER SURGERY Left    left little finger-otif of finger  . Gastric Emptying  07/27/2009   The amount of activity in the stomach at 120 minutes was 13% which is in the normal range  . LASER ABLATION OF THE CERVIX    . LEFT AND RIGHT HEART CATHETERIZATION WITH CORONARY ANGIOGRAM N/A 01/03/2015   Procedure: LEFT AND RIGHT HEART CATHETERIZATION WITH CORONARY ANGIOGRAM;  Surgeon: MiBlane OharaMD;  [facility]

## 2020-08-26 ENCOUNTER — Other Ambulatory Visit: Payer: Self-pay

## 2020-08-26 ENCOUNTER — Encounter: Payer: Self-pay | Admitting: Urology

## 2020-08-26 ENCOUNTER — Ambulatory Visit (INDEPENDENT_AMBULATORY_CARE_PROVIDER_SITE_OTHER): Payer: PPO | Admitting: Urology

## 2020-08-26 VITALS — BP 143/82 | HR 128 | Temp 98.8°F | Ht 62.0 in | Wt 199.0 lb

## 2020-08-26 DIAGNOSIS — Z87442 Personal history of urinary calculi: Secondary | ICD-10-CM | POA: Diagnosis not present

## 2020-08-26 DIAGNOSIS — N3021 Other chronic cystitis with hematuria: Secondary | ICD-10-CM | POA: Diagnosis not present

## 2020-08-26 LAB — URINALYSIS, ROUTINE W REFLEX MICROSCOPIC
Bilirubin, UA: NEGATIVE
Glucose, UA: NEGATIVE
Nitrite, UA: NEGATIVE
RBC, UA: NEGATIVE
Specific Gravity, UA: 1.02 (ref 1.005–1.030)
Urobilinogen, Ur: 1 mg/dL (ref 0.2–1.0)
pH, UA: 5 (ref 5.0–7.5)

## 2020-08-26 LAB — MICROSCOPIC EXAMINATION: Renal Epithel, UA: NONE SEEN /hpf

## 2020-08-26 NOTE — Progress Notes (Signed)
Urological Symptom Review  Patient is experiencing the following symptoms: Frequent urination Hard to postpone urination Get up at night to urinate Urinary tract infection Painful intercourse   Review of Systems  Gastrointestinal (upper)  : Nausea Vomiting Indigestion/heartburn  Gastrointestinal (lower) : Negative for lower GI symptoms  Constitutional : Night Sweats Fatigue  Skin: Skin rash/lesion  Eyes: Blurred vision  Ear/Nose/Throat : Sinus problems  Hematologic/Lymphatic: Negative for Hematologic/Lymphatic symptoms  Cardiovascular : Negative for cardiovascular symptoms  Respiratory : Shortness of breath  Endocrine: Negative for endocrine symptoms  Musculoskeletal: Back pain  Neurological: Headaches Dizziness  Psychologic: Depression Anxiety

## 2020-08-26 NOTE — Addendum Note (Signed)
Addended by: Pollyann Kennedy F on: 08/26/2020 04:32 PM   Modules accepted: Orders

## 2020-09-01 ENCOUNTER — Other Ambulatory Visit: Payer: Self-pay

## 2020-09-01 ENCOUNTER — Telehealth: Payer: Self-pay

## 2020-09-01 ENCOUNTER — Other Ambulatory Visit: Payer: Self-pay | Admitting: Family Medicine

## 2020-09-01 DIAGNOSIS — N390 Urinary tract infection, site not specified: Secondary | ICD-10-CM

## 2020-09-01 LAB — URINE CULTURE

## 2020-09-01 MED ORDER — NITROFURANTOIN MONOHYD MACRO 100 MG PO CAPS: 100.0000 mg | ORAL_CAPSULE | Freq: Two times a day (BID) | ORAL | 0 refills | Status: DC

## 2020-09-01 NOTE — Telephone Encounter (Signed)
-----   Message from Irine Seal, MD sent at 09/01/2020 10:36 AM EDT ----- Her UA was ok but she is growing enterococcus.  She needs macrobid 100mg  po bid #20 and will need a repeat culture in about a month.

## 2020-09-01 NOTE — Telephone Encounter (Signed)
Left message to return call. Rx sent to [facility].

## 2020-09-09 ENCOUNTER — Other Ambulatory Visit: Payer: PPO

## 2020-09-15 ENCOUNTER — Other Ambulatory Visit: Payer: Self-pay | Admitting: Family Medicine

## 2020-09-23 ENCOUNTER — Ambulatory Visit (INDEPENDENT_AMBULATORY_CARE_PROVIDER_SITE_OTHER): Payer: PPO | Admitting: Family Medicine

## 2020-09-23 ENCOUNTER — Other Ambulatory Visit: Payer: Self-pay

## 2020-09-23 ENCOUNTER — Encounter: Payer: Self-pay | Admitting: Family Medicine

## 2020-09-23 VITALS — BP 112/72 | HR 127 | Temp 98.0°F | Wt 195.2 lb

## 2020-09-23 DIAGNOSIS — K3184 Gastroparesis: Secondary | ICD-10-CM

## 2020-09-23 DIAGNOSIS — E1169 Type 2 diabetes mellitus with other specified complication: Secondary | ICD-10-CM | POA: Diagnosis not present

## 2020-09-23 DIAGNOSIS — E1159 Type 2 diabetes mellitus with other circulatory complications: Secondary | ICD-10-CM

## 2020-09-23 DIAGNOSIS — E785 Hyperlipidemia, unspecified: Secondary | ICD-10-CM | POA: Diagnosis not present

## 2020-09-23 DIAGNOSIS — R112 Nausea with vomiting, unspecified: Secondary | ICD-10-CM | POA: Diagnosis not present

## 2020-09-23 DIAGNOSIS — Z79891 Long term (current) use of opiate analgesic: Secondary | ICD-10-CM

## 2020-09-23 NOTE — Progress Notes (Signed)
Subjective:    Patient ID: Jamie Harding, female    DOB: [birth year], 64 y.o.   MRN: [id number]  Diabetes She presents for her follow-up diabetic visit. She has type 2 diabetes mellitus. There are no hypoglycemic associated symptoms. Pertinent negatives for hypoglycemia include no confusion or dizziness. Pertinent negatives for diabetes include no chest pain, no fatigue, no polydipsia, no polyphagia and no weakness. (Both hands have been shaking; gradually worsening ) There are no hypoglycemic complications. Risk factors for coronary artery disease include diabetes mellitus and hypertension. She has not had a previous visit with a dietitian. She participates in exercise three times a week. Home blood sugar record trend: check blood glucose 1-2 times/day. She does not see a podiatrist.Eye exam is current.   This patient was seen today for chronic pain  The medication list was reviewed and updated.   -Compliance with medication: Hydrocodone 7.5-325  - Number patient states they take daily: 3-4  -when was the last dose patient took? This morning  The patient was advised the importance of maintaining medication and not using illegal substances with these.  Here for refills and follow up  The patient was educated that we can provide 3 monthly scripts for their medication, it is their responsibility to follow the instructions.  Side effects or complications from medications: none  Patient is aware that pain medications are meant to minimize the severity of the pain to allow their pain levels to improve to allow for better function. They are aware of that pain medications cannot totally remove their pain.  Due for UDT ( at least once per year) : completed June 2021  Scale of 1 to 10 ( 1 is least 10 is most) Your pain level without the medicine: 9 Your pain level with medication 4  Scale 1 to 10 ( 1-helps very little, 10 helps very well) How well does your pain medication reduce your pain  so you can function better through out the day? 4  Gastroparesis - Plan: Lipase, Hepatic function panel, Basic Metabolic Panel (BMET), CBC with Differential, Hemoglobin A1c  Type 2 diabetes mellitus with vascular disease ([place]) - Plan: Lipase, Hepatic function panel, Basic Metabolic Panel (BMET), CBC with Differential, Hemoglobin A1c  Hyperlipidemia associated with type 2 diabetes mellitus ([place]) - Plan: Lipase, Hepatic function panel, Basic Metabolic Panel (BMET), CBC with Differential, Hemoglobin A1c  Long-term current use of opiate analgesic - Plan: Lipase, Hepatic function panel, Basic Metabolic Panel (BMET), CBC with Differential, Hemoglobin A1c  Non-intractable vomiting with nausea, unspecified vomiting type - Plan: Lipase, Hepatic function panel, Basic Metabolic Panel (BMET), CBC with Differential, Hemoglobin A1c  Patient is having significant trouble with feeling nauseous feels like food does not want to go down she has had a history in the past of gastroparesis She denies any dysphagia She states the other days she woke up nauseous and threw up and the contents of what she threw up was what she ate for lunch  She does have underlying diabetes states her glucoses seem to be in a good range tries watch her diet tries take her medicine regular basis  She does have cholesterol issues and underlying vascular issues she does take her medicine she does try to be mindful of her eating habits     Review of Systems  Constitutional: Negative for activity change, appetite change and fatigue.  HENT: Negative for congestion and rhinorrhea.   Respiratory: Negative for cough and shortness of breath.   Cardiovascular: Negative for  chest pain and leg swelling.  Gastrointestinal: Positive for nausea and vomiting. Negative for abdominal pain and diarrhea.  Endocrine: Negative for polydipsia and polyphagia.  Skin: Negative for color change.  Neurological: Negative for dizziness and weakness.    Psychiatric/Behavioral: Negative for behavioral problems and confusion.       Objective:   Physical Exam Vitals reviewed.  Constitutional:      General: She is not in acute distress. HENT:     Head: Normocephalic and atraumatic.  Eyes:     General:        Right eye: No discharge.        Left eye: No discharge.  Neck:     Trachea: No tracheal deviation.  Cardiovascular:     Rate and Rhythm: Normal rate and regular rhythm.     Heart sounds: Normal heart sounds. No murmur heard.   Pulmonary:     Effort: Pulmonary effort is normal. No respiratory distress.     Breath sounds: Normal breath sounds.  Lymphadenopathy:     Cervical: No cervical adenopathy.  Skin:    General: Skin is warm and dry.  Neurological:     Mental Status: She is alert.     Coordination: Coordination normal.  Psychiatric:        Behavior: Behavior normal.           Assessment & Plan:  1. Gastroparesis Treating with medication is an option.  Need to define the issue better.  It has been almost 8 years since she has had gastric emptying study.  I would recommend repeating gastric emptying study.  We will connect with gastroenterology to get their input.  If gastric emptying study does not show the issue then the next step would be EGD - Lipase - Hepatic function panel - Basic Metabolic Panel (BMET) - CBC with Differential - Hemoglobin A1c  2. Type 2 diabetes mellitus with vascular disease ([place]) Patient needs a continue with her medication watch diet she needs to try to lose weight but it is very difficult she is trying - Lipase - Hepatic function panel - Basic Metabolic Panel (BMET) - CBC with Differential - Hemoglobin A1c  3. Hyperlipidemia associated with type 2 diabetes mellitus ([place]) She does try to be mindful of her diet continue medications. - Lipase - Hepatic function panel - Basic Metabolic Panel (BMET) - CBC with Differential - Hemoglobin A1c  4. Long-term current use of opiate  analgesic Drug registry was checked Patient does benefit from the medicine It allows her pain to be under better control The patient was seen in followup for chronic pain. A review over at their current pain status was discussed. Drug registry was checked. Prescriptions were given.  Regular follow-up recommended. Discussion was held regarding the importance of compliance with medication as well as pain medication contract.  Patient was informed that medication may cause drowsiness and should not be combined  with other medications/alcohol or street drugs. If the patient feels medication is causing altered alertness then do not drive or operate dangerous equipment.   - Lipase - Hepatic function panel - Basic Metabolic Panel (BMET) - CBC with Differential - Hemoglobin A1c  5. Non-intractable vomiting with nausea, unspecified vomiting type More than likely this is gastroparesis will connect with gastroenterology regarding this - Lipase - Hepatic function panel - Basic Metabolic Panel (BMET) - CBC with Differential - Hemoglobin A1c Morbid obesity patient was encouraged to watch diet try to keep things under good control

## 2020-09-24 MED ORDER — HYDROCODONE-ACETAMINOPHEN 7.5-325 MG PO TABS
1.0000 | ORAL_TABLET | Freq: Four times a day (QID) | ORAL | 0 refills | Status: DC | PRN
Start: 2020-09-24 — End: 2020-12-30

## 2020-09-24 MED ORDER — HYDROCODONE-ACETAMINOPHEN 7.5-325 MG PO TABS
ORAL_TABLET | ORAL | 0 refills | Status: DC
Start: 2020-09-24 — End: 2020-12-30

## 2020-09-26 ENCOUNTER — Other Ambulatory Visit: Payer: Self-pay

## 2020-09-26 ENCOUNTER — Other Ambulatory Visit (INDEPENDENT_AMBULATORY_CARE_PROVIDER_SITE_OTHER): Payer: PPO | Admitting: *Deleted

## 2020-09-26 DIAGNOSIS — Z79891 Long term (current) use of opiate analgesic: Secondary | ICD-10-CM | POA: Diagnosis not present

## 2020-09-26 DIAGNOSIS — Z23 Encounter for immunization: Secondary | ICD-10-CM

## 2020-09-26 DIAGNOSIS — K3184 Gastroparesis: Secondary | ICD-10-CM | POA: Diagnosis not present

## 2020-09-26 DIAGNOSIS — R112 Nausea with vomiting, unspecified: Secondary | ICD-10-CM | POA: Diagnosis not present

## 2020-09-26 DIAGNOSIS — E1169 Type 2 diabetes mellitus with other specified complication: Secondary | ICD-10-CM | POA: Diagnosis not present

## 2020-09-26 DIAGNOSIS — E1159 Type 2 diabetes mellitus with other circulatory complications: Secondary | ICD-10-CM | POA: Diagnosis not present

## 2020-09-26 DIAGNOSIS — E785 Hyperlipidemia, unspecified: Secondary | ICD-10-CM | POA: Diagnosis not present

## 2020-09-27 ENCOUNTER — Other Ambulatory Visit: Payer: Self-pay | Admitting: *Deleted

## 2020-09-27 DIAGNOSIS — R112 Nausea with vomiting, unspecified: Secondary | ICD-10-CM

## 2020-09-27 DIAGNOSIS — E1143 Type 2 diabetes mellitus with diabetic autonomic (poly)neuropathy: Secondary | ICD-10-CM

## 2020-09-27 DIAGNOSIS — K3184 Gastroparesis: Secondary | ICD-10-CM

## 2020-09-27 LAB — HEPATIC FUNCTION PANEL
AG Ratio: 1.6 (calc) (ref 1.0–2.5)
ALT: 12 U/L (ref 6–29)
AST: 13 U/L (ref 10–35)
Albumin: 4.5 g/dL (ref 3.6–5.1)
Alkaline phosphatase (APISO): 46 U/L (ref 37–153)
Bilirubin, Direct: 0.1 mg/dL (ref 0.0–0.2)
Globulin: 2.8 g/dL (calc) (ref 1.9–3.7)
Indirect Bilirubin: 0.2 mg/dL (calc) (ref 0.2–1.2)
Total Bilirubin: 0.3 mg/dL (ref 0.2–1.2)
Total Protein: 7.3 g/dL (ref 6.1–8.1)

## 2020-09-27 LAB — BASIC METABOLIC PANEL
BUN/Creatinine Ratio: 8 (calc) (ref 6–22)
BUN: 10 mg/dL (ref 7–25)
CO2: 27 mmol/L (ref 20–32)
Calcium: 9.7 mg/dL (ref 8.6–10.4)
Chloride: 104 mmol/L (ref 98–110)
Creat: 1.23 mg/dL — ABNORMAL HIGH (ref 0.50–0.99)
Glucose, Bld: 160 mg/dL — ABNORMAL HIGH (ref 65–99)
Potassium: 4.6 mmol/L (ref 3.5–5.3)
Sodium: 140 mmol/L (ref 135–146)

## 2020-09-27 LAB — CBC WITH DIFFERENTIAL/PLATELET
Absolute Monocytes: 451 cells/uL (ref 200–950)
Basophils Absolute: 58 cells/uL (ref 0–200)
Basophils Relative: 0.6 %
Eosinophils Absolute: 269 cells/uL (ref 15–500)
Eosinophils Relative: 2.8 %
HCT: 41.4 % (ref 35.0–45.0)
Hemoglobin: 13.7 g/dL (ref 11.7–15.5)
Lymphs Abs: 4003 cells/uL — ABNORMAL HIGH (ref 850–3900)
MCH: 29.1 pg (ref 27.0–33.0)
MCHC: 33.1 g/dL (ref 32.0–36.0)
MCV: 87.9 fL (ref 80.0–100.0)
MPV: 9.5 fL (ref 7.5–12.5)
Monocytes Relative: 4.7 %
Neutro Abs: 4819 cells/uL (ref 1500–7800)
Neutrophils Relative %: 50.2 %
Platelets: 316 10*3/uL (ref 140–400)
RBC: 4.71 10*6/uL (ref 3.80–5.10)
RDW: 12.2 % (ref 11.0–15.0)
Total Lymphocyte: 41.7 %
WBC: 9.6 10*3/uL (ref 3.8–10.8)

## 2020-09-27 LAB — HEMOGLOBIN A1C
Hgb A1c MFr Bld: 6.5 % of total Hgb — ABNORMAL HIGH (ref <5.7)
Mean Plasma Glucose: 140 (calc)
eAG (mmol/L): 7.7 (calc)

## 2020-09-27 LAB — LIPASE: Lipase: 54 U/L (ref 7–60)

## 2020-09-27 NOTE — Progress Notes (Signed)
Error

## 2020-09-27 NOTE — Progress Notes (Signed)
gastr

## 2020-09-29 ENCOUNTER — Other Ambulatory Visit: Payer: PPO

## 2020-09-30 ENCOUNTER — Other Ambulatory Visit: Payer: PPO

## 2020-09-30 ENCOUNTER — Other Ambulatory Visit: Payer: Self-pay

## 2020-09-30 DIAGNOSIS — N390 Urinary tract infection, site not specified: Secondary | ICD-10-CM | POA: Diagnosis not present

## 2020-10-03 ENCOUNTER — Encounter: Payer: Self-pay | Admitting: Urology

## 2020-10-03 DIAGNOSIS — N390 Urinary tract infection, site not specified: Secondary | ICD-10-CM

## 2020-10-03 MED ORDER — CIPROFLOXACIN HCL 500 MG PO TABS: 500.0000 mg | ORAL_TABLET | Freq: Two times a day (BID) | ORAL | 0 refills | Status: DC

## 2020-10-04 ENCOUNTER — Encounter (HOSPITAL_COMMUNITY): Payer: Self-pay

## 2020-10-04 ENCOUNTER — Other Ambulatory Visit: Payer: Self-pay

## 2020-10-04 ENCOUNTER — Ambulatory Visit (HOSPITAL_COMMUNITY)
Admission: RE | Admit: 2020-10-04 | Discharge: 2020-10-04 | Disposition: A | Discharge status: Home / Self Care | Payer: PPO | Source: Ambulatory Visit | Attending: Family Medicine | Admitting: Family Medicine

## 2020-10-04 DIAGNOSIS — E1143 Type 2 diabetes mellitus with diabetic autonomic (poly)neuropathy: Secondary | ICD-10-CM | POA: Diagnosis not present

## 2020-10-04 DIAGNOSIS — K3184 Gastroparesis: Secondary | ICD-10-CM | POA: Diagnosis not present

## 2020-10-04 DIAGNOSIS — R112 Nausea with vomiting, unspecified: Secondary | ICD-10-CM | POA: Diagnosis not present

## 2020-10-04 DIAGNOSIS — K30 Functional dyspepsia: Secondary | ICD-10-CM | POA: Diagnosis not present

## 2020-10-04 MED ORDER — TECHNETIUM TC 99M SULFUR COLLOID
2.0000 | Freq: Once | INTRAVENOUS | Status: AC | PRN
  Administered 2020-10-04: 1.95 via ORAL

## 2020-10-05 ENCOUNTER — Telehealth: Payer: Self-pay

## 2020-10-05 ENCOUNTER — Other Ambulatory Visit: Payer: Self-pay

## 2020-10-05 LAB — URINE CULTURE

## 2020-10-05 MED ORDER — NITROFURANTOIN MONOHYD MACRO 100 MG PO CAPS: 100.0000 mg | ORAL_CAPSULE | Freq: Every day | ORAL | 2 refills | Status: DC

## 2020-10-05 MED ORDER — NITROFURANTOIN MONOHYD MACRO 100 MG PO CAPS: 100.0000 mg | ORAL_CAPSULE | Freq: Two times a day (BID) | ORAL | 2 refills | Status: DC

## 2020-10-05 NOTE — Telephone Encounter (Signed)
-----   Message from Irine Seal, MD sent at 10/05/2020 12:15 PM EST ----- Her culture is positive and sensitive to cipro.   After she completes the cipro, I would like to put her on macrobid 100mg  po qhs #30 with 2 refills.   She will need f/u in 2-3 months for a recheck.

## 2020-10-05 NOTE — Telephone Encounter (Signed)
Pt notified of new rx taking after finishing current Cipro and f/u appt.

## 2020-10-26 ENCOUNTER — Other Ambulatory Visit: Payer: Self-pay | Admitting: Family Medicine

## 2020-11-25 ENCOUNTER — Ambulatory Visit: Payer: PPO | Admitting: Urology

## 2020-11-25 ENCOUNTER — Telehealth: Payer: Self-pay | Admitting: Family Medicine

## 2020-11-25 ENCOUNTER — Encounter: Payer: Self-pay | Admitting: Family Medicine

## 2020-11-25 NOTE — Telephone Encounter (Signed)
New letter dictated, corrected

## 2020-11-25 NOTE — Telephone Encounter (Signed)
See message below. I called and offered pt an appt but she declined. Has a heaadache, keeping down liquids. Took zofran and phenergan but not helping. Vomited all night and 3 -4 times today. Vomiting has slowed but now has started having diarrhea. No fever. Burning cramping pain from belly button down.  [facility]

## 2020-11-25 NOTE — Telephone Encounter (Signed)
Front Please [facility] for [facility] They are requesting recent medical records Also I dictated a letter that should go with this This can be done anywhere during the week of January 10 thank you

## 2020-12-01 ENCOUNTER — Ambulatory Visit (INDEPENDENT_AMBULATORY_CARE_PROVIDER_SITE_OTHER): Payer: PPO | Admitting: Urology

## 2020-12-01 ENCOUNTER — Encounter: Payer: Self-pay | Admitting: Urology

## 2020-12-01 ENCOUNTER — Other Ambulatory Visit: Payer: Self-pay

## 2020-12-01 VITALS — BP 145/80 | HR 90 | Temp 98.8°F | Ht 62.0 in | Wt 193.0 lb

## 2020-12-01 DIAGNOSIS — N390 Urinary tract infection, site not specified: Secondary | ICD-10-CM | POA: Diagnosis not present

## 2020-12-01 LAB — URINALYSIS, ROUTINE W REFLEX MICROSCOPIC
Bilirubin, UA: NEGATIVE
Glucose, UA: NEGATIVE
Leukocytes,UA: NEGATIVE
Nitrite, UA: NEGATIVE
RBC, UA: NEGATIVE
Specific Gravity, UA: >1.03 — ABNORMAL HIGH (ref 1.005–1.030)
Urobilinogen, Ur: 0.2 mg/dL (ref 0.2–1.0)
pH, UA: 5 (ref 5.0–7.5)

## 2020-12-01 LAB — MICROSCOPIC EXAMINATION: Renal Epithel, UA: NONE SEEN /hpf

## 2020-12-01 NOTE — Progress Notes (Signed)
[id number] 2:20 PM   Jamie Harding [birth year] [id number]  Referring provider: Kathyrn Drown, MD [place],  [place] [id number]  Dysuria  HPI: Jamie Harding is a 65yo here for followup for nephrolithiasis and recurrent UTIs.   She stopped the cephalexin suppression about a week and a half ago and has no recurrent symptoms.  He had been on TMP 138m nightly for suppression previous to the cephalexin.  She was treated with a week of Cipro on 10/03/20 but has had no breakthrough UTI's in the last 2 months.    She has no urgency or incontinence.   She has a history of stones but no worrisome symptoms.  She has had no hematuria.  Her UA is unremarkable today.   PMH: Past Medical History:  Diagnosis Date  . Anxiety   . Asthma   . Depression   . Essential hypertension   . Fatty liver   . Gastritis   . Gastroparesis   . GERD (gastroesophageal reflux disease)   . Hiatal hernia   . History of cardiac catheterization    Minimal coronary atherosclerosis February 2016  . History of kidney stones   . History of stroke 2008  . HLD (hyperlipidemia)   . Neuropathy   . Obesity   . Pancreatitis   . Rheumatoid arthritis(714.0)   . [place] ([place]   . Tibialis tendinitis   . Type 2 diabetes mellitus ([facility]     Surgical History: Past Surgical History:  Procedure Laterality Date  . ANKLE SURGERY     remove extra bone-left  . CARPAL TUNNEL RELEASE     right side  . CARPAL TUNNEL RELEASE Left 02/06/2013   Procedure: CARPAL TUNNEL RELEASE ;  Surgeon: SCarole Civil MD;  Location: [facility];  Service: Orthopedics;  Laterality: Left;  procedure end 1135  . CARPAL TUNNEL RELEASE Right 09/14/2015   Procedure: RIGHT CARPAL TUNNEL RELEASE;  Surgeon: SCarole Civil MD;  Location: [facility];  Service: Orthopedics;  Laterality: Right;  . CARPAL TUNNEL RELEASE Left 09/28/2015   Procedure: LEFT CARPAL TUNNEL RELEASE;  Surgeon: SCarole Civil MD;  Location: [facility];  Service:  Orthopedics;  Laterality: Left;  procedure 1  . CESAREAN SECTION    . CHOLECYSTECTOMY    . COLONOSCOPY WITH PROPOFOL  09/16/2012   SYWV:PXTGsessile polyps ranging between 3-533min size were found in the sigmoid colon and rectum; polypectomy was performed/Mild diverticulosis was noted throughout the entire examined colon/Small internal hemorrhoids  . DILATATION & CURETTAGE/HYSTEROSCOPY WITH MYOSURE N/A 08/05/2019   Procedure: DILATATION & CURETTAGE/HYSTEROSCOPY WITH MYOSURE;  Surgeon: DaSloan LeiterMD;  [place] [facility] Service: [place]necology;  Laterality: N/A;  myosure rep will be here.  Confirmed on 07/30/19 CS  . DILATION AND CURETTAGE OF UTERUS     multiple  . ESOPHAGOGASTRODUODENOSCOPY  07/29/2009   Mild gastritis, benign path  . ESOPHAGOGASTRODUODENOSCOPY (EGD) WITH PROPOFOL  09/16/2012   SLF:Non-erosive gastritis (inflammation) was found; multiple bx/The duodenal mucosa showed no abnormalities in the ampulla and bulb and second portion of the duodenum/NAUSEA/VOMITING MOST LIKELY DUE TO GERD/GASTRITIS  . FINGER SURGERY Left    left little finger-otif of finger  . Gastric Emptying  07/27/2009   The amount of activity in the stomach at 120 minutes was 13% which is in the normal range  . LASER ABLATION OF THE CERVIX    . LEFT AND RIGHT HEART CATHETERIZATION WITH CORONARY ANGIOGRAM N/A 01/03/2015  Procedure: LEFT AND RIGHT HEART CATHETERIZATION WITH CORONARY ANGIOGRAM;  Surgeon: Blane Ohara, MD;  Location: [facility] CATH LAB;  Service: Cardiovascular;  Laterality: N/A;  . LITHOTRIPSY    . POLYPECTOMY  09/16/2012   Procedure: POLYPECTOMY;  Surgeon: Danie Binder, MD;  Location: [facility];  Service: Endoscopy;  Laterality: N/A;  . SAVORY DILATION  09/16/2012   Procedure: SAVORY DILATION;  Surgeon: Danie Binder, MD;  Location: [facility];  Service: Endoscopy;  Laterality: N/A;  16 fr dilation  . TRIGGER FINGER RELEASE Left 02/06/2013   Procedure: RELEASE TRIGGER FINGER LEFT LONG  FINGER/A-1 PULLEY;  Surgeon: Carole Civil, MD;  Location: [facility];  Service: Orthopedics;  Laterality: Left;  procedure began 1136  . TRIGGER FINGER RELEASE Right 09/14/2015   Procedure: RIGHT LONG FINGER TRIGGER RELEASE;  Surgeon: Carole Civil, MD;  Location: [facility];  Service: Orthopedics;  Laterality: Right;  . TRIGGER FINGER RELEASE Left 09/28/2015   Procedure: LEFT RING TRIGGER FINGER RELEASE;  Surgeon: Carole Civil, MD;  Location: [facility];  Service: Orthopedics;  Laterality: Left;  left ring finger  . TUBAL LIGATION      Home Medications:  Allergies as of 12/01/2020      Reactions   Byetta 10 Mcg Pen [exenatide] Diarrhea, Nausea And Vomiting     touch of pancreatis   Naproxen Nausea And Vomiting   Pollen Extract    Augmentin [amoxicillin-pot Clavulanate]    diarrhea   Azithromycin Nausea And Vomiting, Rash   Erythromycin Hives       Invokana [canagliflozin]    Urinary issues,yeast infection   Morphine Hives, Rash   Sulfonamide Derivatives Nausea And Vomiting, Rash      Medication List       Accurate as of December 01, 2020  3:34 PM. If you have any questions, ask your nurse or doctor.        STOP taking these medications   ciprofloxacin 500 MG tablet Commonly known as: CIPRO Stopped by: Irine Seal, MD   nitrofurantoin (macrocrystal-monohydrate) 100 MG capsule Commonly known as: MACROBID Stopped by: Irine Seal, MD     TAKE these medications   albuterol 108 (90 Base) MCG/ACT inhaler Commonly known as: Ventolin HFA INHALE 2 PUFFS INTO THE LUNGS EVERY 4 (FOUR) HOURS AS NEEDED FOR WHEEZING.   amLODipine 5 MG tablet Commonly known as: NORVASC Take 1 tablet (5 mg total) by mouth daily.   blood glucose meter kit and supplies Dispense based on patient and insurance preference. Pt checking sugars four times a day . (FOR ICD-10 E10.9, E11.9).   busPIRone 10 MG tablet Commonly known as: BUSPAR Take 1 tablet BID PRN   cetirizine 10 MG tablet Commonly  known as: ZYRTEC TAKE ONE TABLET (10MG TOTAL) BY MOUTH DAILY   clopidogrel 75 MG tablet Commonly known as: PLAVIX TAKE ONE TABLET (75MG TOTAL) BY MOUTH DAILY WITH BREAKFAST   fluticasone 50 MCG/ACT nasal spray Commonly known as: FLONASE Place 2 sprays into both nostrils daily.   HYDROcodone-acetaminophen 7.5-325 MG tablet Commonly known as: NORCO Take one tablet po every 6 hrs prn moderate pain   HYDROcodone-acetaminophen 7.5-325 MG tablet Commonly known as: NORCO Take 1 tablet by mouth every 6 (six) hours as needed for moderate pain.   HYDROcodone-acetaminophen 7.5-325 MG tablet Commonly known as: NORCO Take 1 tablet by mouth every 6 (six) hours as needed for moderate pain.   insulin detemir 100 UNIT/ML injection Commonly known as: LEVEMIR 70 units in the am and  60 units qhs 70 units in the am and 60 units qhs   lisinopril 20 MG tablet Commonly known as: ZESTRIL TAKE ONE AND ONE-HALF TABLET (30MG TOTAL) BY MOUTH DAILY   metFORMIN 1000 MG tablet Commonly known as: GLUCOPHAGE TAKE ONE TABLET (1000 MG TOTAL) BY MOUTHTWO TIMES A DAY.   multivitamin with minerals Tabs tablet Take 1 tablet by mouth daily.   mupirocin ointment 2 % Commonly known as: Bactroban Apply 1 application topically 2 (two) times daily.   NovoLOG FlexPen 100 UNIT/ML FlexPen Generic drug: insulin aspart Inject 22 Units into the skin 3 (three) times daily with meals. What changed: how much to take   NovoLOG 100 UNIT/ML injection Generic drug: insulin aspart Inject into the skin. What changed: Another medication with the same name was changed. Make sure you understand how and when to take each.   ondansetron 8 MG tablet Commonly known as: ZOFRAN TAKE ONE TABLET BY MOUTH EVERY 12 HOURS AS NEEDED FOR NAUSEA   OneTouch Verio test strip Generic drug: glucose blood USE AS DIRECTED TO TEST BLOOD GLUCOSE 4 TIMES DAILY.   pantoprazole 40 MG tablet Commonly known as: PROTONIX TAKE ONE TABLET (40 MG  TOTAL) BY MOUTH DAILY.   promethazine 25 MG tablet Commonly known as: PHENERGAN TAKE ONE TABLET BY MOUTH EVERY EIGHT HOURS AS NEEDED FOR NAUSEA   rosuvastatin 20 MG tablet Commonly known as: CRESTOR TAKE ONE TABLET BY MOUTH ONCE DAILY.   sitaGLIPtin 100 MG tablet Commonly known as: Januvia Take 1 tablet (100 mg total) by mouth daily.   triamcinolone 0.1 % Commonly known as: KENALOG Apply BID prn   venlafaxine 75 MG tablet Commonly known as: EFFEXOR TAKE ONE (1) TABLET BY MOUTH 3 TIMES DAILY       Allergies:  Allergies  Allergen Reactions  . Byetta 10 Mcg Pen [Exenatide] Diarrhea and Nausea And Vomiting      touch of pancreatis  . Naproxen Nausea And Vomiting  . Pollen Extract   . Augmentin [Amoxicillin-Pot Clavulanate]     diarrhea  . Azithromycin Nausea And Vomiting and Rash  . Erythromycin Hives       . Invokana [Canagliflozin]     Urinary issues,yeast infection  . Morphine Hives and Rash  . Sulfonamide Derivatives Nausea And Vomiting and Rash    Family History: Family History  Problem Relation Age of Onset  . Breast cancer Mother   . Diabetes Father   . Coronary artery disease Other   . Arthritis Other   . Asthma Other   . Diabetes Sister   . Colon cancer Neg Hx     Social History:  reports that she quit smoking about 33 years ago. Her smoking use included cigarettes. She has a 1.50 pack-year smoking history. She has never used smokeless tobacco. She reports that she does not drink alcohol and does not use drugs.  ROS: She has some residual numbness in her left foot and balance issues but all other review of systems were reviewed and are negative except what is noted above in HPI  Physical Exam: BP (!) 145/80   Pulse 90   Temp 98.8 F (37.1 C)   Ht 5' 2"  (1.575 m)   Wt 193 lb (87.5 kg)   BMI 35.30 kg/m     Laboratory Data: Lab Results  Component Value Date   WBC 9.6 09/26/2020   HGB 13.7 09/26/2020   HCT 41.4 09/26/2020   MCV 87.9  09/26/2020   PLT 316 09/26/2020  Lab Results  Component Value Date   CREATININE 1.23 (H) 09/26/2020    No results found for: PSA  No results found for: TESTOSTERONE  Lab Results  Component Value Date   HGBA1C 6.5 (H) 09/26/2020    Urinalysis    Component Value Date/Time   COLORURINE YELLOW 11/05/2013 1205   APPEARANCEUR Clear 12/01/2020 1509   LABSPEC 1.020 11/05/2013 1205   PHURINE 5.5 11/05/2013 1205   GLUCOSEU Negative 12/01/2020 [place] 11/05/2013 1205   BILIRUBINUR Negative 12/01/2020 1509   KETONESUR 15 (A) 11/05/2013 1205   PROTEINUR 1+ (A) 12/01/2020 1509   PROTEINUR NEGATIVE 11/05/2013 1205   UROBILINOGEN 0.2 05/20/2020 1416   UROBILINOGEN 0.2 11/05/2013 1205   NITRITE Negative 12/01/2020 1509   NITRITE NEGATIVE 11/05/2013 1205   LEUKOCYTESUR Negative 12/01/2020 1509    Lab Results  Component Value Date   LABMICR See below: 12/01/2020   WBCUA 0-5 12/01/2020   LABEPIT 0-10 12/01/2020   MUCUS Present 08/26/2020   BACTERIA Few (A) 12/01/2020    Pertinent Imaging: CT stone study 01/14/2020: Images reviewed and discussed with patient Results for orders placed during the hospital encounter of 05/26/14  DG Abd 1 View   Narrative CLINICAL DATA:  Renal calculus  EXAM: ABDOMEN - 1 VIEW  COMPARISON:  November 20, 2013.  FINDINGS: There is fairly extensive stool throughout the colon. The overall bowel gas pattern is unremarkable without obstruction or free air. There is a 3 mm calcification in the mid right pelvis which is an on the previous study and probably represents a phlebolith. No other abnormal calcifications are identified. There are surgical clips in the gallbladder fossa region.  IMPRESSION: There is a 3 mm calcification in the lateral right mid pelvis which is stable compared to the prior study and may well represent a levo. The no other abnormal calcifications are identified. Note that stool in the colon could obscure small  renal or ureteral calculi. There is fairly diffuse stool throughout the colon.   Electronically Signed   By: Lowella Grip M.D.   On: 05/26/2014 14:25    No results found for this or any previous visit. No results found for this or any previous visit. No results found for this or any previous visit. No results found for this or any previous visit. No results found for this or any previous visit. No results found for this or any previous visit. Results for orders placed during the hospital encounter of 01/14/20  CT RENAL STONE STUDY   Narrative CLINICAL DATA:  Evaluate for urinary tract calculi. Right-sided abdominal pain for 6 months  EXAM: CT ABDOMEN AND PELVIS WITHOUT CONTRAST  TECHNIQUE: Multidetector CT imaging of the abdomen and pelvis was performed following the standard protocol without IV contrast.  COMPARISON:  09/25/2012.  FINDINGS: Lower chest: No acute abnormality.  Hepatobiliary: No focal liver abnormality is seen. Status post cholecystectomy. No biliary dilatation.  Pancreas: Unremarkable. No pancreatic ductal dilatation or surrounding inflammatory changes.  Spleen: Normal in size without focal abnormality.  Adrenals/Urinary Tract: Normal appearance of the adrenal glands.  Right upper pole renal calculus measures 3 mm, image 67/5. No hydronephrosis identified bilaterally. Urinary bladder is unremarkable. No bladder calculi identified.  Stomach/Bowel: Stomach is normal. The small bowel loops have a normal course and caliber without obstruction. The appendix is visualized and appears normal  Vascular/Lymphatic: Aortic atherosclerosis. No aneurysm. No abdominopelvic adenopathy.  Reproductive: Uterus and bilateral adnexa are unremarkable.  Other: No free fluid or fluid  collections identified.  Musculoskeletal: Lumbar degenerative disc disease identified. Chronic calcified posterior disc herniation.  IMPRESSION: 1. No acute findings within the  abdomen or pelvis. 2. Nonobstructing right upper pole renal calculus.  Aortic Atherosclerosis (ICD10-I70.0).   Electronically Signed   By: Kerby Moors M.D.   On: 01/14/2020 13:53    UA today is unremarkable.   Assessment & Plan:    1. Chronic cystitis with hematuria -UA unremarkable today.   -RTC 3 months with UA She is not a candidate for topical estrogen because of her CVA history.     2. Nephrolithiasis -observation -RTC 3 month with renal US as previously scheduled.   Return in about 3 months (around 03/01/2021) for with renal US which should have been ordered at her last visit. Irine Seal, MD  [facility] [place]

## 2020-12-01 NOTE — Progress Notes (Signed)
Urological Symptom Review  Patient is experiencing the following symptoms: Frequent urination Hard to postpone urination Get up at night to urinate Leakage of urine Painful intercourse   Review of Systems  Gastrointestinal (upper)  : Nausea Vomiting Indigestion/heartburn  Gastrointestinal (lower) : Constipation  Constitutional : Fatigue  Skin: Skin rash/lesion  Eyes: Negative for eye symptoms  Ear/Nose/Throat : Sinus problems  Hematologic/Lymphatic: Negative for Hematologic/Lymphatic symptoms  Cardiovascular : Negative for cardiovascular symptoms  Respiratory : Negative for respiratory symptoms  Endocrine: Excessive thirst  Musculoskeletal: Back pain  Neurological: Headaches Dizziness  Psychologic: Depression Anxiety

## 2020-12-02 ENCOUNTER — Other Ambulatory Visit: Payer: Self-pay

## 2020-12-02 ENCOUNTER — Other Ambulatory Visit: Payer: Self-pay | Admitting: Family Medicine

## 2020-12-02 DIAGNOSIS — N200 Calculus of kidney: Secondary | ICD-10-CM

## 2020-12-05 ENCOUNTER — Encounter: Payer: Self-pay | Admitting: Family Medicine

## 2020-12-06 NOTE — Telephone Encounter (Signed)
Nurses I recommend a office visit with Santiago Glad tomorrow

## 2020-12-19 ENCOUNTER — Other Ambulatory Visit: Payer: Self-pay | Admitting: Family Medicine

## 2020-12-26 ENCOUNTER — Ambulatory Visit: Payer: PPO | Admitting: Family Medicine

## 2020-12-30 ENCOUNTER — Other Ambulatory Visit: Payer: Self-pay

## 2020-12-30 ENCOUNTER — Encounter: Payer: Self-pay | Admitting: Family Medicine

## 2020-12-30 ENCOUNTER — Ambulatory Visit (INDEPENDENT_AMBULATORY_CARE_PROVIDER_SITE_OTHER): Payer: PPO | Admitting: Family Medicine

## 2020-12-30 VITALS — BP 132/78 | HR 92 | Temp 97.2°F | Ht 62.0 in | Wt 195.2 lb

## 2020-12-30 DIAGNOSIS — E1159 Type 2 diabetes mellitus with other circulatory complications: Secondary | ICD-10-CM

## 2020-12-30 DIAGNOSIS — E1169 Type 2 diabetes mellitus with other specified complication: Secondary | ICD-10-CM | POA: Diagnosis not present

## 2020-12-30 DIAGNOSIS — I10 Essential (primary) hypertension: Secondary | ICD-10-CM

## 2020-12-30 DIAGNOSIS — K3184 Gastroparesis: Secondary | ICD-10-CM

## 2020-12-30 DIAGNOSIS — E785 Hyperlipidemia, unspecified: Secondary | ICD-10-CM | POA: Diagnosis not present

## 2020-12-30 DIAGNOSIS — G894 Chronic pain syndrome: Secondary | ICD-10-CM

## 2020-12-30 DIAGNOSIS — E1143 Type 2 diabetes mellitus with diabetic autonomic (poly)neuropathy: Secondary | ICD-10-CM | POA: Diagnosis not present

## 2020-12-30 DIAGNOSIS — R112 Nausea with vomiting, unspecified: Secondary | ICD-10-CM | POA: Diagnosis not present

## 2020-12-30 MED ORDER — INSULIN DETEMIR 100 UNIT/ML ~~LOC~~ SOLN: SUBCUTANEOUS | 5 refills | Status: DC

## 2020-12-30 MED ORDER — PROMETHAZINE HCL 25 MG PO TABS: ORAL_TABLET | ORAL | 5 refills | Status: DC

## 2020-12-30 MED ORDER — HYDROCODONE-ACETAMINOPHEN 7.5-325 MG PO TABS: ORAL_TABLET | ORAL | 0 refills | Status: DC

## 2020-12-30 MED ORDER — ONDANSETRON HCL 8 MG PO TABS: ORAL_TABLET | ORAL | 5 refills | Status: DC

## 2020-12-30 MED ORDER — LISINOPRIL 10 MG PO TABS: ORAL_TABLET | ORAL | 5 refills | Status: DC

## 2020-12-30 MED ORDER — ROSUVASTATIN CALCIUM 20 MG PO TABS: 20.0000 mg | ORAL_TABLET | Freq: Every day | ORAL | 1 refills | Status: DC

## 2020-12-30 MED ORDER — CLOPIDOGREL BISULFATE 75 MG PO TABS: ORAL_TABLET | ORAL | 1 refills | Status: DC

## 2020-12-30 MED ORDER — METFORMIN HCL 1000 MG PO TABS: ORAL_TABLET | ORAL | 1 refills | Status: DC

## 2020-12-30 MED ORDER — PANTOPRAZOLE SODIUM 40 MG PO TBEC: DELAYED_RELEASE_TABLET | ORAL | 1 refills | Status: DC

## 2020-12-30 MED ORDER — NOVOLOG 100 UNIT/ML ~~LOC~~ SOLN: SUBCUTANEOUS | 5 refills | Status: DC

## 2020-12-30 MED ORDER — VENLAFAXINE HCL 75 MG PO TABS: ORAL_TABLET | ORAL | 1 refills | Status: DC

## 2020-12-30 MED ORDER — HYDROCODONE-ACETAMINOPHEN 7.5-325 MG PO TABS: 1.0000 | ORAL_TABLET | Freq: Four times a day (QID) | ORAL | 0 refills | Status: DC | PRN

## 2020-12-30 MED ORDER — BUSPIRONE HCL 10 MG PO TABS: ORAL_TABLET | ORAL | 5 refills | Status: DC

## 2020-12-30 MED ORDER — AMLODIPINE BESYLATE 5 MG PO TABS: 5.0000 mg | ORAL_TABLET | Freq: Every day | ORAL | 1 refills | Status: DC

## 2020-12-30 NOTE — Progress Notes (Signed)
   Subjective:    Patient ID: Jamie Harding, female    DOB: 1956/06/23, 65 y.o.   MRN: [id number]  HPI  This patient was seen today for chronic pain  The medication list was reviewed and updated.   -Compliance with medication: yes  - Number patient states they take daily: 4 a day  -when was the last dose patient took? today  The patient was advised the importance of maintaining medication and not using illegal substances with these.  Here for refills and follow up  The patient was educated that we can provide 3 monthly scripts for their medication, it is their responsibility to follow the instructions.  Side effects or complications from medications: none  Patient is aware that pain medications are meant to minimize the severity of the pain to allow their pain levels to improve to allow for better function. They are aware of that pain medications cannot totally remove their pain.  Due for UDT ( at least once per year) : 05/02/20   Diabetic gastroparesis ([place]) - Plan: Lipid Profile, Basic Metabolic Panel (BMET), Hemoglobin A1c  Chronic pain syndrome - Plan: Lipid Profile, Basic Metabolic Panel (BMET), Hemoglobin A1c  Nausea and vomiting, intractability of vomiting not specified, unspecified vomiting type - Plan: Lipid Profile, Basic Metabolic Panel (BMET), Hemoglobin A1c  Type 2 diabetes mellitus with vascular disease ([place])  Essential hypertension  Hyperlipidemia associated with type 2 diabetes mellitus ([place])  Morbid obesity due to excess calories ([place]), Chronic       Review of Systems See above she denies chest tightness pressure pain she relates extreme nausea with all overeating with intermittent vomiting related to the gastroparesis    Objective:   Physical Exam Lungs are clear respiratory rate normal heart regular no murmurs extremities no edema abdomen soft diabetic foot exam normal with neuropathy       Assessment & Plan:  Discuss Reglan with GI  1.  Diabetic gastroparesis ([place]) We will discussed with gastroenterology whether or not Reglan can be used to try to help her she has tried Phenergan and Zofran without any help she is also tried frequent small meals without help - Lipid Profile - Basic Metabolic Panel (BMET) - Hemoglobin A1c  2. Chronic pain syndrome The patient was seen in followup for chronic pain. A review over at their current pain status was discussed. Drug registry was checked. Prescriptions were given.  Regular follow-up recommended. Discussion was held regarding the importance of compliance with medication as well as pain medication contract.  Patient was informed that medication may cause drowsiness and should not be combined  with other medications/alcohol or street drugs. If the patient feels medication is causing altered alertness then do not drive or operate dangerous equipment.  Drug registry checked she does benefit from the medicine we will continue this - Lipid Profile - Basic Metabolic Panel (BMET) - Hemoglobin A1c  3. Nausea and vomiting, intractability of vomiting not specified, unspecified vomiting type Phenergan Zofran as needed along with small frequent meals - Lipid Profile - Basic Metabolic Panel (BMET) - Hemoglobin A1c  4. Type 2 diabetes mellitus with vascular disease ([place]) Under fair control check lab work await results  5. Essential hypertension Blood pressure good control continue current measures  6. Hyperlipidemia associated with type 2 diabetes mellitus ([place]) Continue cholesterol medicine check lab work  7. Morbid obesity due to excess calories ([place]) Watch portions try to lose weight  Recheck 4 months

## 2021-01-02 NOTE — Addendum Note (Signed)
Addended by: Vicente Males on: 01/02/2021 09:16 AM   Modules accepted: Orders

## 2021-01-02 NOTE — Progress Notes (Signed)
01/02/21-Referral to GI placed.

## 2021-01-04 ENCOUNTER — Encounter: Payer: Self-pay | Admitting: Internal Medicine

## 2021-01-05 ENCOUNTER — Encounter: Payer: Self-pay | Admitting: Family Medicine

## 2021-01-06 NOTE — Telephone Encounter (Signed)
Nurses I would recommend clear liquids, diluted Gatorade, simple foods,  In addition to this I would recommend an office visit this afternoon side door Even though this could be a viral gastroenteritis it is also possible this could be Covid and I would recommend that we do testing-PCR test thanks

## 2021-01-08 ENCOUNTER — Telehealth: Payer: Self-pay | Admitting: Family Medicine

## 2021-01-08 NOTE — Telephone Encounter (Signed)
Front  Please set up patient to do an 1130 or 1145 consultation with me via phone regarding her gastroparesis (Please schedule on a day where 1 of those slots are open thank you)  (Should be noted that gastroenterology stated Reglan could be used but I would want to discuss side effects with the patient before utilizing typically would start off with 5 mg each evening)

## 2021-01-10 ENCOUNTER — Encounter: Payer: Self-pay | Admitting: Family Medicine

## 2021-01-10 ENCOUNTER — Other Ambulatory Visit: Payer: Self-pay

## 2021-01-10 ENCOUNTER — Telehealth: Payer: Self-pay | Admitting: Family Medicine

## 2021-01-10 ENCOUNTER — Telehealth (INDEPENDENT_AMBULATORY_CARE_PROVIDER_SITE_OTHER): Payer: PPO | Admitting: Family Medicine

## 2021-01-10 DIAGNOSIS — E1143 Type 2 diabetes mellitus with diabetic autonomic (poly)neuropathy: Secondary | ICD-10-CM

## 2021-01-10 DIAGNOSIS — K3184 Gastroparesis: Secondary | ICD-10-CM

## 2021-01-10 MED ORDER — METOCLOPRAMIDE HCL 5 MG PO TABS: ORAL_TABLET | ORAL | 0 refills | Status: DC

## 2021-01-10 NOTE — Progress Notes (Signed)
   Subjective:    Patient ID: Jamie Harding, female    DOB: May 23, 1956, 65 y.o.   MRN: [id number]  HPI Pt had nausea and vomiting last week. Unable to keep any food down. Pt doing better today. Has been able to eat. No vomiting or diarrhea.  20-minute discussion regarding diabetic gastroparesis We also discussed dietary measures including mincing up her food or blending her food.  Taking small bites with sips of water In addition to this we discussed Reglan including potential side effects including tar dive dyskinesia We also talked about how gastroparesis sometimes can be worse sometimes better but it will never totally go away We do try to keep her diabetes under good control  Virtual Visit via Telephone Note  I connected with [place] on 31/54/00 at 11:30 AM EST by telephone and verified that I am speaking with the correct person using two identifiers.  Location: Patient: home Provider: office   I discussed the limitations, risks, security and privacy concerns of performing an evaluation and management service by telephone and the availability of in person appointments. I also discussed with the patient that there may be a patient responsible charge related to this service. The patient expressed understanding and agreed to proceed.   History of Present Illness:    Observations/Objective:   Assessment and Plan:   Follow Up Instructions:    I discussed the assessment and treatment plan with the patient. The patient was provided an opportunity to ask questions and all were answered. The patient agreed with the plan and demonstrated an understanding of the instructions.   The patient was advised to call back or seek an in-person evaluation if the symptoms worsen or if the condition fails to improve as anticipated.  I provided 20 minutes of non-face-to-face time during this encounter.   Her gastric emptying study from November 2021 was reviewed with her in  detail   Review of Systems Nausea intermittent vomiting intermittent abdominal pain    Objective:   Physical Exam   Today's visit was via telephone Physical exam was not possible for this visit      Assessment & Plan:  Diabetic gastroparesis Long-term this will potentially get worse It is wise for her to mince up her foods or in some situations when it is worse limit her foods In addition to this sips of liquid would be advisable We did discuss Reglan also discussed side effects including tardive dyskinesia It is advised to use this medicine only when her symptoms are severe and hopefully she will not have to use it on a daily basis It is also advised for her to see gastroenterology she has an appointment later in March to discuss this further We will also mail to her a printout from the [facility] regarding foods intended for gastroparesis.  She will follow up here on a regular basis

## 2021-01-10 NOTE — Telephone Encounter (Signed)
Ms. megha, agnes are scheduled for a virtual visit with your provider today.    Just as we do with appointments in the office, we must obtain your consent to participate.  Your consent will be active for this visit and any virtual visit you may have with one of our providers in the next 365 days.    If you have a MyChart account, I can also send a copy of this consent to you electronically.  All virtual visits are billed to your insurance company just like a traditional visit in the office.  As this is a virtual visit, video technology does not allow for your provider to perform a traditional examination.  This may limit your provider's ability to fully assess your condition.  If your provider identifies any concerns that need to be evaluated in person or the need to arrange testing such as labs, EKG, etc, we will make arrangements to do so.    Although advances in technology are sophisticated, we cannot ensure that it will always work on either your end or our end.  If the connection with a video visit is poor, we may have to switch to a telephone visit.  With either a video or telephone visit, we are not always able to ensure that we have a secure connection.   I need to obtain your verbal consent now.   Are you willing to proceed with your visit today?   Makenze A Kulinski has provided verbal consent on 01/10/2021 for a virtual visit (video or telephone).   Vicente Males, LPN 04/22/5408  81:19 AM

## 2021-01-23 IMAGING — MG DIGITAL DIAGNOSTIC BILATERAL MAMMOGRAM WITH TOMO AND CAD
8 of 15 series · 8 of 40 positions shown · non-contrast
Comparison: Previous exam(s).

CLINICAL DATA: 62-year-old female with pain involving the far upper
outer left breast.

EXAM:
DIGITAL DIAGNOSTIC BILATERAL MAMMOGRAM WITH CAD AND TOMO
LEFT BREAST ULTRASOUND

[L CC synth-2D (1 of 2)]
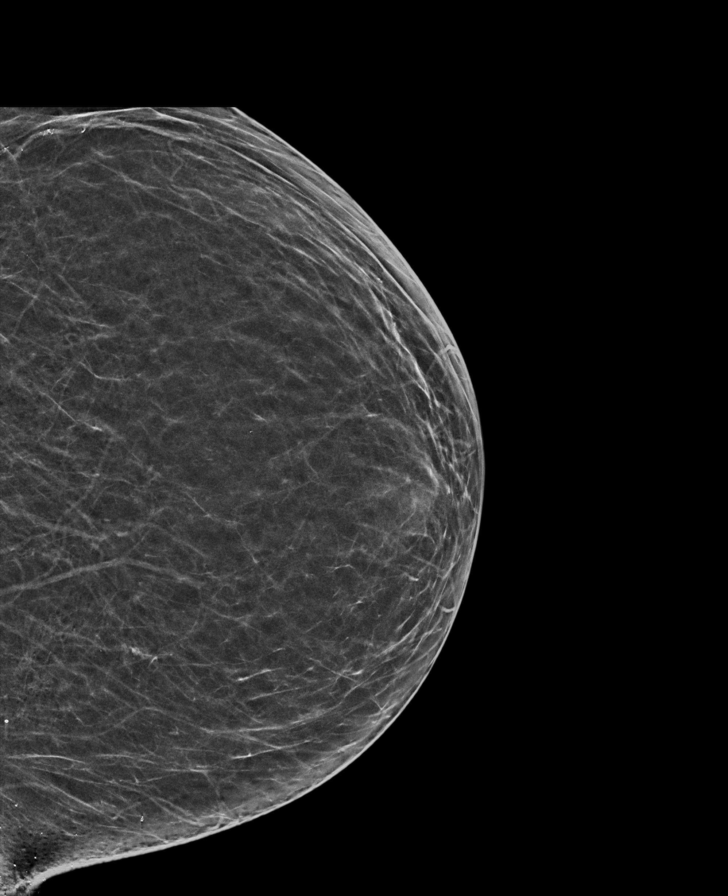

[R CC synth-2D (1 of 2)]
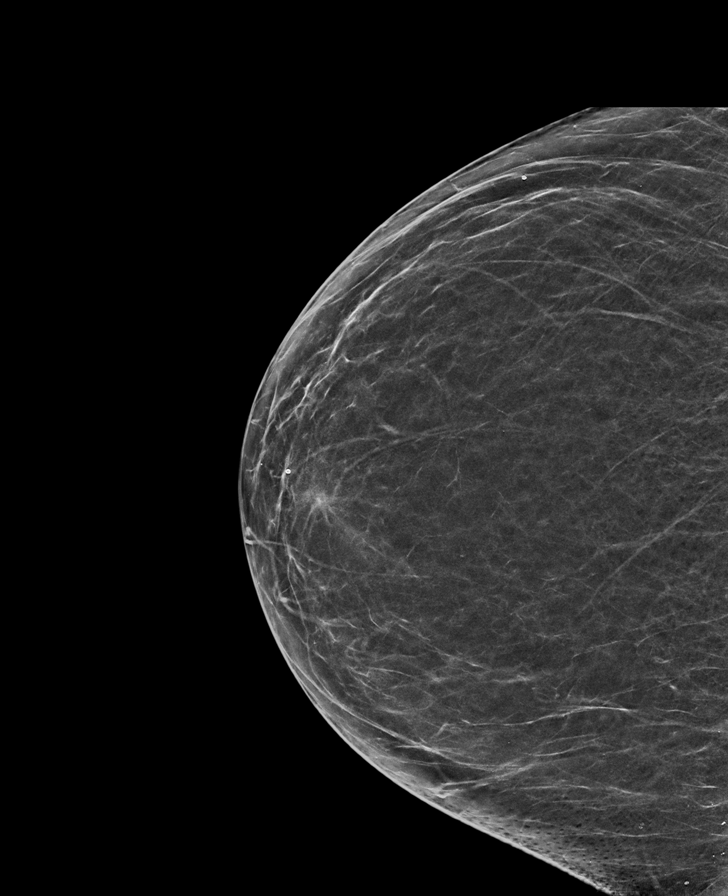

[R MLO synth-2D]
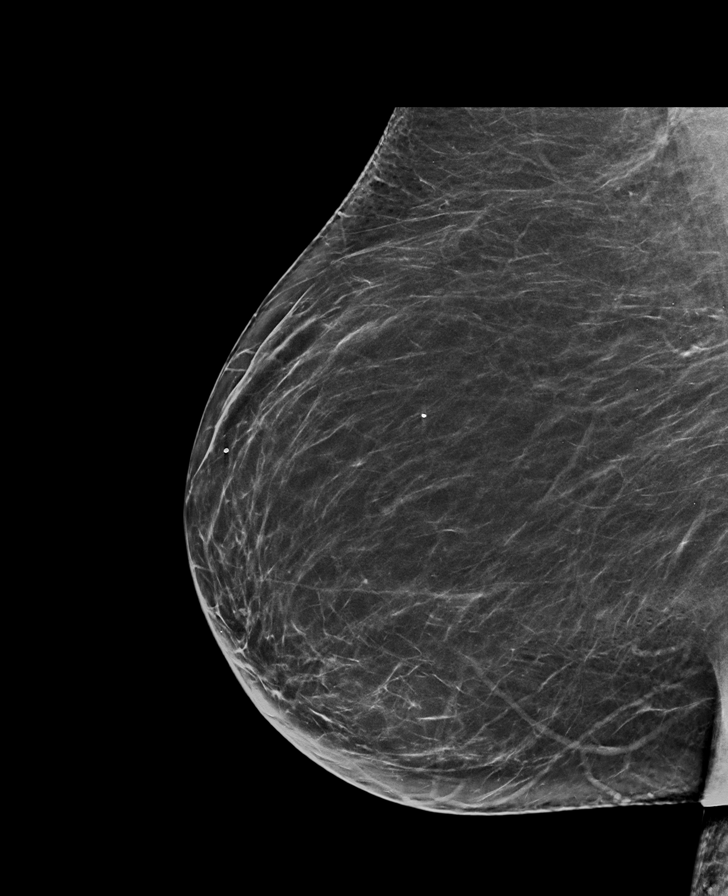

[R CC synth-2D (2 of 2)]
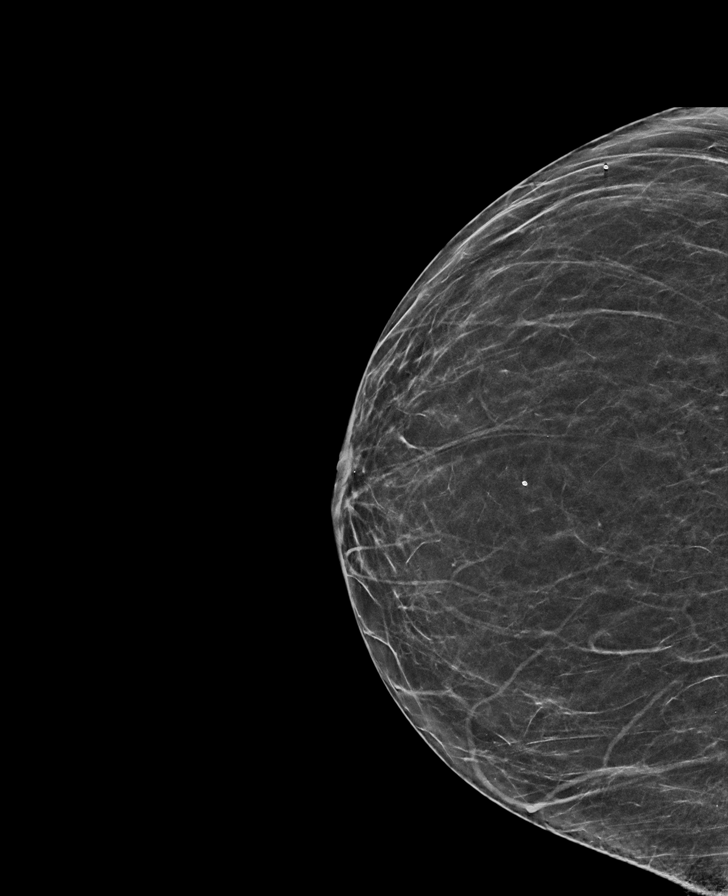

[L XCCL synth-2D]
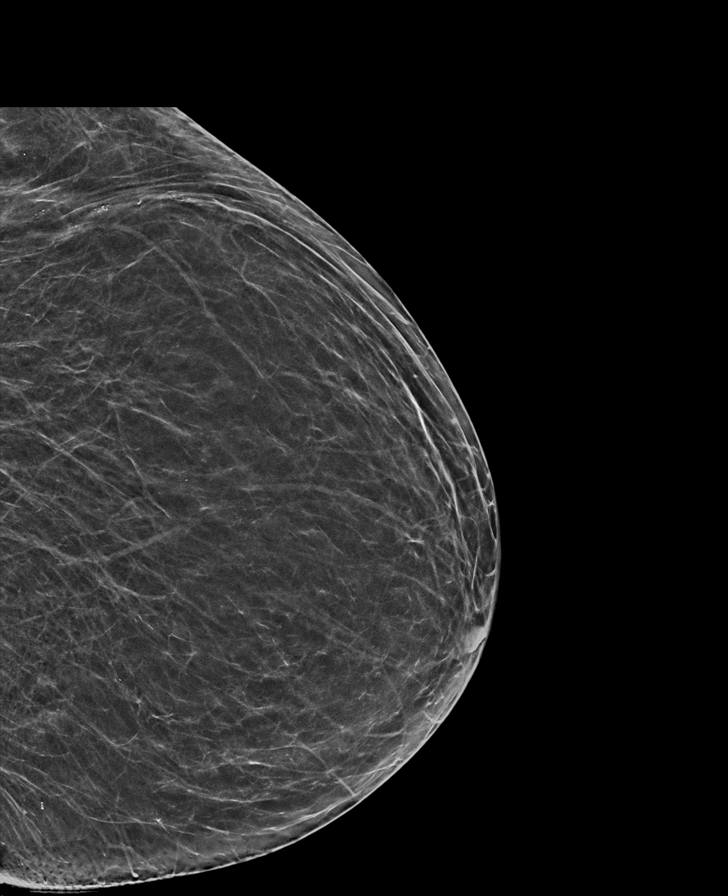

[L TAN synth-2D]
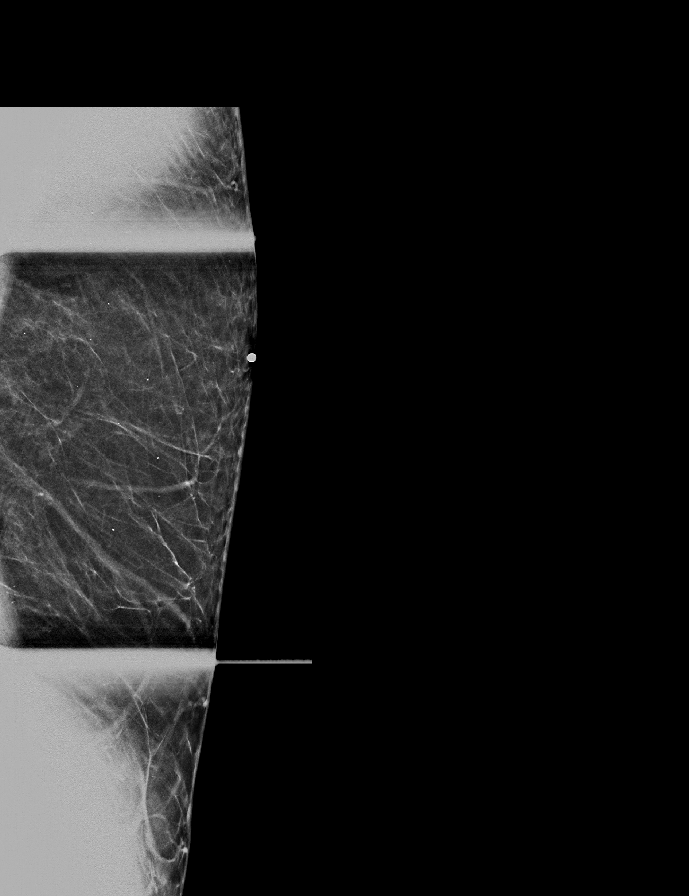

[L CC synth-2D (2 of 2)]
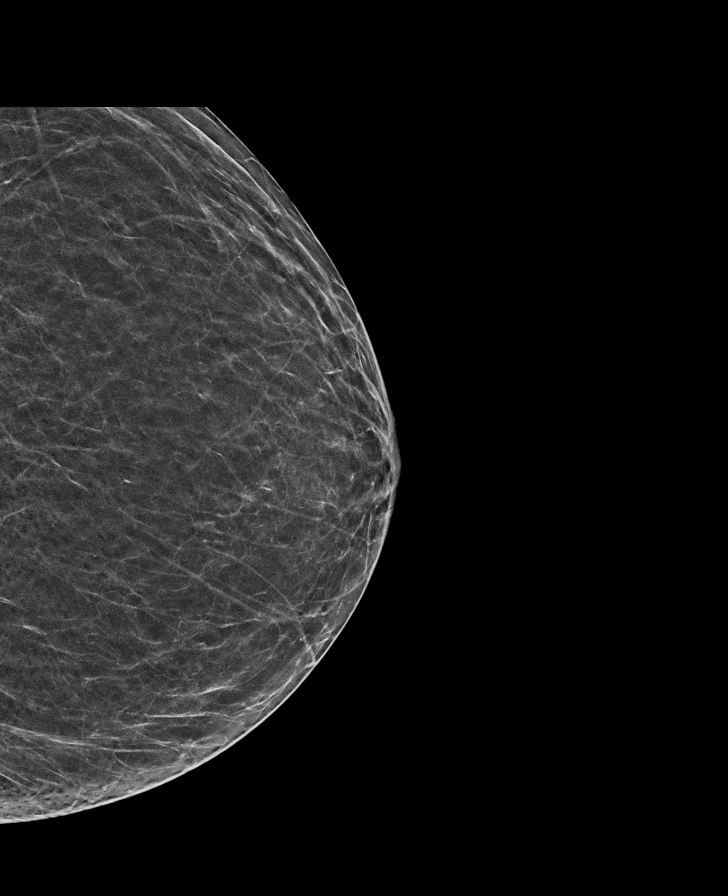

[L TAN tomo · tomo slice 33/48.0]
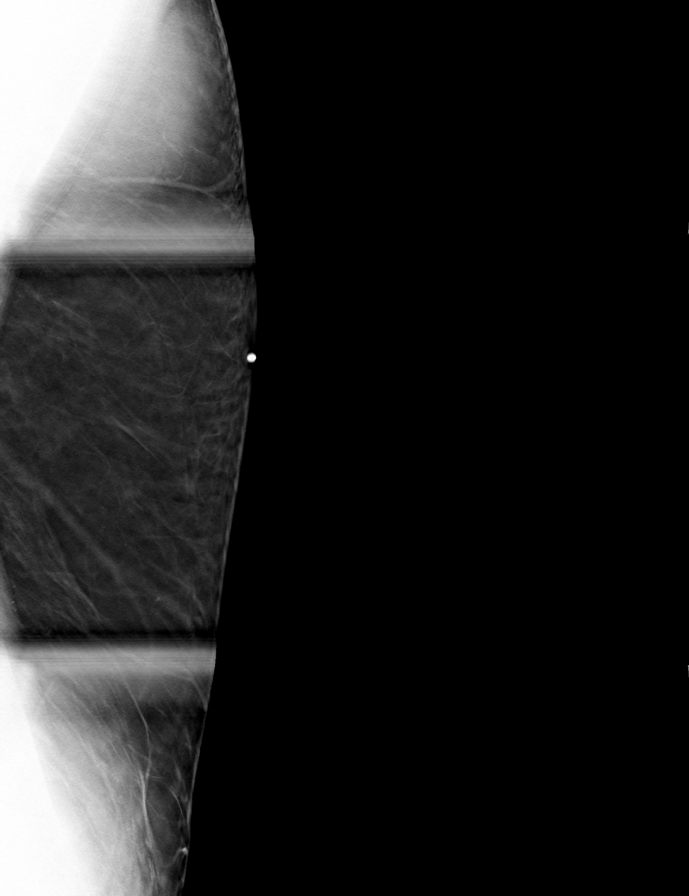

[8 of 40 positions shown; findings below may reference images not displayed]

ACR Breast Density Category b: There are scattered areas of
fibroglandular density.
FINDINGS: No suspicious masses or calcifications are seen in either breast.
Spot compression tangential tomograms were performed over the area
of pain in the far upper outer left breast with no definite
abnormality seen.

Mammographic images were processed with CAD.

Physical examination of far outer left breast in the region of pain
does not reveal any palpable no masses. The patient is tender to
deep palpation.

Targeted ultrasound of the left breast was performed. No suspicious
masses or abnormality seen.
IMPRESSION: 1.  No findings of malignancy in either breast.

2. No abnormalities identified at site of pain in the far outer left
breast.

RECOMMENDATION:
1. Recommend further management of left breast pain be based on
clinical assessment.

2.  Screening mammogram in one year.(Code:[id number]-[id number])

I have discussed the findings and recommendations with the patient.
Results were also provided in writing at the conclusion of the
visit. If applicable, a reminder letter will be sent to the patient
regarding the next appointment.

BI-RADS CATEGORY  1: Negative.

## 2021-02-09 ENCOUNTER — Telehealth: Payer: Self-pay | Admitting: Family Medicine

## 2021-02-09 NOTE — Telephone Encounter (Signed)
Nurses Please send in the medicine as lisinopril 10 mg 1 daily, #30, 5 refills If her insurance covers 90 days it would be fine to do #90 with 1 refill Please also send a message to the clinical pharmacist that this was taking care of thank you

## 2021-02-09 NOTE — Telephone Encounter (Signed)
Secure chat from [facility]:  Hi Verna Desrocher! I'm Hannah, a CPhT with [facility]. I was reviewing Mrs. Mcenaney's chart in regards to noncompliance of her Lisinopril in 2021. It looks like she was previously on 30mg  total daily but on 12/30/20, Dr. Wolfgang Phoenix decreased her dose to 10mg  total daily. The sig is written as follows: "TAKE ONE AND ONE-HALF TABLET (30MG  TOTAL) BY MOUTH DAILY", but the drug is Lisinopril 10mg  (which is 15mg  total if the pt is taking as the sig states to take). I assume this is likely just a mistake as the pt picked up Lisinopril 10mg  x QTY 30/ 30 days. I just wanted to reach out and see if we can get the script's sig corrected to reflect that 10mg  QD dosing to prevent any confusion and patient safety concerns. Thanks so much!!   Please advise. Thank you.

## 2021-02-10 MED ORDER — LISINOPRIL 10 MG PO TABS: ORAL_TABLET | ORAL | 1 refills | Status: DC

## 2021-02-10 NOTE — Addendum Note (Signed)
Addended by: Dairl Ponder on: 02/10/2021 11:42 AM   Modules accepted: Orders

## 2021-02-10 NOTE — Telephone Encounter (Signed)
Patient notified and wanted script sent to [facility]. Prescription sent electronically to pharmacy.

## 2021-02-11 NOTE — Progress Notes (Deleted)
Referring Provider: Kathyrn Drown, MD Primary Care Physician:  Kathyrn Drown, MD Primary Gastroenterologist:  Dr. Rayne Du chief complaint on file.   HPI:   Jamie Harding is a 65 y.o. female presenting today at the request of Luking, Elayne Snare, MD for Diabetic gastroparesis.  Gastric emptying study on file from November 2021 with delayed gastric emptying.  57% emptied at 4 hours.  EGD and October 2013 for dyspepsia, vomiting, dysphagia revealing normal esophagus s/p dilation, nonerosive gastritis s/p biopsy (chronic inactive gastritis, negative H. pylori), normal examined duodenum.  Suspected nausea/vomiting related to GERD/gastritis and consider gastroparesis at that time but gastric emptying study was normal.  Colonoscopy in October 2013 with hyperplastic polyps.  Next colonoscopy in 2023.  Today:  Past Medical History:  Diagnosis Date  . Anxiety   . Asthma   . Depression   . Essential hypertension   . Fatty liver   . Gastritis   . Gastroparesis   . GERD (gastroesophageal reflux disease)   . Hiatal hernia   . History of cardiac catheterization    Minimal coronary atherosclerosis February 2016  . History of kidney stones   . History of stroke 2008  . HLD (hyperlipidemia)   . Neuropathy   . Obesity   . Pancreatitis   . Rheumatoid arthritis(714.0)   . Stroke ([place])   . Tibialis tendinitis   . Type 2 diabetes mellitus ([place])     Past Surgical History:  Procedure Laterality Date  . ANKLE SURGERY     remove extra bone-left  . CARPAL TUNNEL RELEASE     right side  . CARPAL TUNNEL RELEASE Left 02/06/2013   Procedure: CARPAL TUNNEL RELEASE ;  Surgeon: Carole Civil, MD;  Location: [facility];  Service: Orthopedics;  Laterality: Left;  procedure end 1135  . CARPAL TUNNEL RELEASE Right 09/14/2015   Procedure: RIGHT CARPAL TUNNEL RELEASE;  Surgeon: Carole Civil, MD;  Location: [facility];  Service: Orthopedics;  Laterality: Right;  . CARPAL TUNNEL RELEASE Left 09/28/2015    Procedure: LEFT CARPAL TUNNEL RELEASE;  Surgeon: Carole Civil, MD;  Location: [facility];  Service: Orthopedics;  Laterality: Left;  procedure 1  . CESAREAN SECTION    . CHOLECYSTECTOMY    . COLONOSCOPY WITH PROPOFOL  09/16/2012   MVE:[facility] sessile polyps ranging between 3-2m in size were found in the sigmoid colon and rectum; polypectomy was performed/Mild diverticulosis was noted throughout the entire examined colon/Small internal hemorrhoids  . DILATATION & CURETTAGE/HYSTEROSCOPY WITH MYOSURE N/A 08/05/2019   Procedure: DILATATION & CURETTAGE/HYSTEROSCOPY WITH MYOSURE;  Surgeon: DSloan Leiter MD;  [place] [facility]  Service: Gynecology;  Laterality: N/A;  myosure rep will be here.  Confirmed on 07/30/19 CS  . DILATION AND CURETTAGE OF UTERUS     multiple  . ESOPHAGOGASTRODUODENOSCOPY  07/29/2009   Mild gastritis, benign path  . ESOPHAGOGASTRODUODENOSCOPY (EGD) WITH PROPOFOL  09/16/2012   SLF:Non-erosive gastritis (inflammation) was found; multiple bx/The duodenal mucosa showed no abnormalities in the ampulla and bulb and second portion of the duodenum/NAUSEA/VOMITING MOST LIKELY DUE TO GERD/GASTRITIS  . FINGER SURGERY Left    left little finger-otif of finger  . Gastric Emptying  07/27/2009   The amount of activity in the stomach at 120 minutes was 13% which is in the normal range  . LASER ABLATION OF THE CERVIX    . LEFT AND RIGHT HEART CATHETERIZATION WITH CORONARY ANGIOGRAM N/A 01/03/2015   Procedure: LEFT AND RIGHT HEART  CATHETERIZATION WITH CORONARY ANGIOGRAM;  Surgeon: Blane Ohara, MD;  Location: [facility] C[facility];  Service: Cardiovascular;  Laterality: N/A;  . LITHOTRIPSY    . POLYPECTOMY  09/16/2012   Procedure: POLYPECTOMY;  Surgeon: Danie Binder, MD;  Location: [facility];  Service: Endoscopy;  Laterality: N/A;  . SAVORY DILATION  09/16/2012   Procedure: SAVORY DILATION;  Surgeon: Danie Binder, MD;  Location: [facility];  Service: Endoscopy;  Laterality: N/A;   16 fr dilation  . TRIGGER FINGER RELEASE Left 02/06/2013   Procedure: RELEASE TRIGGER FINGER LEFT LONG FINGER/A-1 PULLEY;  Surgeon: Carole Civil, MD;  Location: [facility];  Service: Orthopedics;  Laterality: Left;  procedure began 1136  . TRIGGER FINGER RELEASE Right 09/14/2015   Procedure: RIGHT LONG FINGER TRIGGER RELEASE;  Surgeon: Carole Civil, MD;  Location: [facility];  Service: Orthopedics;  Laterality: Right;  . TRIGGER FINGER RELEASE Left 09/28/2015   Procedure: LEFT RING TRIGGER FINGER RELEASE;  Surgeon: Carole Civil, MD;  Location: [facility];  Service: Orthopedics;  Laterality: Left;  left ring finger  . TUBAL LIGATION      Current Outpatient Medications  Medication Sig Dispense Refill  . albuterol (VENTOLIN HFA) 108 (90 Base) MCG/ACT inhaler INHALE 2 PUFFS INTO THE LUNGS EVERY 4 (FOUR) HOURS AS NEEDED FOR WHEEZING. 54 g 5  . amLODipine (NORVASC) 5 MG tablet Take 1 tablet (5 mg total) by mouth daily. 90 tablet 1  . blood glucose meter kit and supplies Dispense based on patient and insurance preference. Pt checking sugars four times a day . (FOR ICD-10 E10.9, E11.9). 1 each 0  . busPIRone (BUSPAR) 10 MG tablet Take 1 tablet BID PRN 60 tablet 5  . cetirizine (ZYRTEC) 10 MG tablet TAKE ONE TABLET (10MG TOTAL) BY MOUTH DAILY 90 tablet 0  . clopidogrel (PLAVIX) 75 MG tablet TAKE ONE TABLET (75MG TOTAL) BY MOUTH DAILY WITH BREAKFAST 90 tablet 1  . fluticasone (FLONASE) 50 MCG/ACT nasal spray Place 2 sprays into both nostrils daily. 48 g 3  . HYDROcodone-acetaminophen (NORCO) 7.5-325 MG tablet Take one tablet po every 6 hrs prn moderate pain 120 tablet 0  . HYDROcodone-acetaminophen (NORCO) 7.5-325 MG tablet Take 1 tablet by mouth every 6 (six) hours as needed for moderate pain. 120 tablet 0  . HYDROcodone-acetaminophen (NORCO) 7.5-325 MG tablet Take 1 tablet by mouth every 6 (six) hours as needed for moderate pain. 120 tablet 0  . insulin detemir (LEVEMIR) 100 UNIT/ML injection  Inject 50 units in the skin in the morning and 50 units into skin qhs 15 mL 5  . lisinopril (ZESTRIL) 10 MG tablet TAKE ONE AND ONE-HALF TABLET (30MG TOTAL) BY MOUTH DAILY 90 tablet 1  . metFORMIN (GLUCOPHAGE) 1000 MG tablet TAKE ONE TABLET (1000 MG TOTAL) BY MOUTHTWO TIMES A DAY. 180 tablet 1  . metoCLOPramide (REGLAN) 5 MG tablet Crush 1 tablet: Take approximately 20 minutes before meal when necessary use sparingly 30 tablet 0  . Multiple Vitamin (MULTIVITAMIN WITH MINERALS) TABS Take 1 tablet by mouth daily.    . mupirocin ointment (BACTROBAN) 2 % Apply 1 application topically 2 (two) times daily. 30 g 0  . NOVOLOG 100 UNIT/ML injection Inject 10 units into skin with breakfast and 10 units into skin at supper 10 mL 5  . ondansetron (ZOFRAN) 8 MG tablet TAKE ONE TABLET BY MOUTH EVERY 12 HOURS AS NEEDED FOR NAUSEA 20 tablet 5  . ONETOUCH VERIO test strip USE AS DIRECTED TO TEST BLOOD  GLUCOSE 4 TIMES DAILY. 200 strip 0  . pantoprazole (PROTONIX) 40 MG tablet TAKE ONE TABLET (40 MG TOTAL) BY MOUTH DAILY. 90 tablet 1  . promethazine (PHENERGAN) 25 MG tablet TAKE ONE TABLET BY MOUTH EVERY EIGHT HOURS AS NEEDED FOR NAUSEA 30 tablet 5  . rosuvastatin (CRESTOR) 20 MG tablet Take 1 tablet (20 mg total) by mouth daily. 90 tablet 1  . triamcinolone cream (KENALOG) 0.1 % Apply BID prn 45 g 3  . venlafaxine (EFFEXOR) 75 MG tablet TAKE ONE (1) TABLET BY MOUTH 3 TIMES DAILY 270 tablet 1   No current facility-administered medications for this visit.    Allergies as of 02/13/2021 - Review Complete 01/10/2021  Allergen Reaction Noted  . Byetta 10 mcg pen [exenatide] Diarrhea and Nausea And Vomiting 02/02/2013  . Naproxen Nausea And Vomiting 07/15/2009  . Pollen extract  05/05/2020  . Augmentin [amoxicillin-pot clavulanate]  03/16/2014  . Azithromycin Nausea And Vomiting and Rash 07/15/2009  . Erythromycin Hives 02/02/2013  . Invokana [canagliflozin]  09/13/2014  . Morphine Hives and Rash   . Sulfonamide  derivatives Nausea And Vomiting and Rash     Family History  Problem Relation Age of Onset  . Breast cancer Mother   . Diabetes Father   . Coronary artery disease Other   . Arthritis Other   . Asthma Other   . Diabetes Sister   . Colon cancer Neg Hx     Social History   Socioeconomic History  . Marital status: Married    Spouse name: Not on file  . Number of children: Not on file  . Years of education: Not on file  . Highest education level: Not on file  Occupational History  . Occupation: Chartered certified accountant    Comment: [employer], 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)    Employer: [employer]  Tobacco Use  . Smoking status: Former Smoker    Packs/day: 0.50    Years: 3.00    Pack years: 1.50    Types: Cigarettes    Quit date: 09/11/1987    Years since quitting: 33.4  . Smokeless tobacco: Never Used  Substance and Sexual Activity  . Alcohol use: No  . Drug use: No  . Sexual activity: Not Currently    Birth control/protection: Surgical  Other Topics Concern  . Not on file  Social History Narrative  . Not on file   Social Determinants of Health   Financial Resource Strain: Not on file  Food Insecurity: Not on file  Transportation Needs: Not on file  Physical Activity: Not on file  Stress: Not on file  Social Connections: Not on file  Intimate Partner Violence: Not on file    Review of Systems: Gen: Denies any fever, chills, fatigue, weight loss, lack of appetite.  CV: Denies chest pain, heart palpitations, peripheral edema, syncope.  Resp: Denies shortness of breath at rest or with exertion. Denies wheezing or cough.  GI: Denies dysphagia or odynophagia. Denies jaundice, hematemesis, fecal incontinence. GU : Denies urinary burning, urinary frequency, urinary hesitancy MS: Denies joint pain, muscle weakness, cramps, or limitation of movement.  Derm: Denies rash, itching, dry skin Psych: Denies depression, anxiety, memory loss, and confusion Heme: Denies bruising,  bleeding, and enlarged lymph nodes.  Physical Exam: There were no vitals taken for this visit. General:   Alert and oriented. Pleasant and cooperative. Well-nourished and well-developed.  Head:  Normocephalic and atraumatic. Eyes:  Without icterus, sclera clear and conjunctiva pink.  Ears:  Normal auditory  acuity. Nose:  No deformity, discharge,  or lesions. Mouth:  No deformity or lesions, oral mucosa pink.  Neck:  Supple, without mass or thyromegaly. Lungs:  Clear to auscultation bilaterally. No wheezes, rales, or rhonchi. No distress.  Heart:  S1, S2 present without murmurs appreciated.  Abdomen:  +BS, soft, non-tender and non-distended. No HSM noted. No guarding or rebound. No masses appreciated.  Rectal:  Deferred  Msk:  Symmetrical without gross deformities. Normal posture. Pulses:  Normal pulses noted. Extremities:  Without clubbing or edema. Neurologic:  Alert and  oriented x4;  grossly normal neurologically. Skin:  Intact without significant lesions or rashes. Cervical Nodes:  No significant cervical adenopathy. Psych:  Alert and cooperative. Normal mood and affect.

## 2021-02-13 ENCOUNTER — Ambulatory Visit: Payer: PPO | Admitting: Gastroenterology

## 2021-02-27 ENCOUNTER — Other Ambulatory Visit: Payer: Self-pay

## 2021-02-27 ENCOUNTER — Ambulatory Visit (HOSPITAL_COMMUNITY)
Admission: RE | Admit: 2021-02-27 | Discharge: 2021-02-27 | Disposition: A | Discharge status: Home / Self Care | Payer: PPO | Source: Ambulatory Visit | Attending: Urology | Admitting: Urology

## 2021-02-27 DIAGNOSIS — N200 Calculus of kidney: Secondary | ICD-10-CM | POA: Insufficient documentation

## 2021-02-27 DIAGNOSIS — N202 Calculus of kidney with calculus of ureter: Secondary | ICD-10-CM | POA: Diagnosis not present

## 2021-03-02 ENCOUNTER — Ambulatory Visit (INDEPENDENT_AMBULATORY_CARE_PROVIDER_SITE_OTHER): Payer: PPO | Admitting: Urology

## 2021-03-02 ENCOUNTER — Other Ambulatory Visit: Payer: Self-pay

## 2021-03-02 ENCOUNTER — Encounter: Payer: Self-pay | Admitting: Urology

## 2021-03-02 VITALS — BP 112/69 | HR 83 | Temp 98.6°F

## 2021-03-02 DIAGNOSIS — N390 Urinary tract infection, site not specified: Secondary | ICD-10-CM | POA: Diagnosis not present

## 2021-03-02 DIAGNOSIS — Z87442 Personal history of urinary calculi: Secondary | ICD-10-CM | POA: Diagnosis not present

## 2021-03-02 DIAGNOSIS — N3946 Mixed incontinence: Secondary | ICD-10-CM

## 2021-03-02 LAB — URINALYSIS, ROUTINE W REFLEX MICROSCOPIC
Bilirubin, UA: NEGATIVE
Glucose, UA: NEGATIVE
Leukocytes,UA: NEGATIVE
Nitrite, UA: NEGATIVE
Protein,UA: NEGATIVE
RBC, UA: NEGATIVE
Specific Gravity, UA: >1.03 — ABNORMAL HIGH (ref 1.005–1.030)
Urobilinogen, Ur: 0.2 mg/dL (ref 0.2–1.0)
pH, UA: 5.5 (ref 5.0–7.5)

## 2021-03-02 MED ORDER — TRIMETHOPRIM 100 MG PO TABS: 100.0000 mg | ORAL_TABLET | Freq: Every day | ORAL | 3 refills | Status: DC

## 2021-03-02 NOTE — Progress Notes (Signed)
Urological Symptom Review  Patient is experiencing the following symptoms: Frequent urination Hard to postpone urination Get up at night to urinate Leakage of urine Stream starts and stops Painful intercourse   Review of Systems  Gastrointestinal (upper)  : Nausea Vomiting  Gastrointestinal (lower) : Negative for lower GI symptoms  Constitutional : Night Sweats Fatigue  Skin: Skin rash/lesion Itching  Eyes: Blurred vision  Ear/Nose/Throat : Negative for Ear/Nose/Throat symptoms  Hematologic/Lymphatic: Negative for Hematologic/Lymphatic symptoms  Cardiovascular : Negative for cardiovascular symptoms  Respiratory : Negative for respiratory symptoms  Endocrine: Negative for endocrine symptoms  Musculoskeletal: Back pain Joint pain  Neurological: Dizziness  Psychologic: Depression Anxiety

## 2021-03-02 NOTE — Progress Notes (Signed)
83/72/9021 1:15 PM   Jamie Harding [birth year] [id number]  Referring provider: Kathyrn Drown, MD [address] [place],  [place] [address]  Dysuria  HPI: Jamie Harding is a 65yo here for followup for nephrolithiasis and recurrent UTIs.   She stopped the cephalexin suppression prior to her last visit.  She did well for about 6 weeks after the visit but then had recurrent dysuria and resumed the TMP.  She is doing well now with a clear urine.     She was treated with a week of Cipro for enterococcus on 10/03/20.    She has no urgency or incontinence but has a history of incontinence.   She has a history of stones but no worrisome symptoms and a renal US prior to this visit was negative.  She has had no hematuria.     PMH: Past Medical History:  Diagnosis Date  . Anxiety   . Asthma   . Depression   . Essential hypertension   . Fatty liver   . Gastritis   . Gastroparesis   . GERD (gastroesophageal reflux disease)   . Hiatal hernia   . History of cardiac catheterization    Minimal coronary atherosclerosis February 2016  . History of kidney stones   . History of stroke 2008  . HLD (hyperlipidemia)   . Neuropathy   . Obesity   . Pancreatitis   . Rheumatoid arthritis(714.0)   . Stroke ([place])   . Tibialis tendinitis   . Type 2 diabetes mellitus [facility])     Surgical History: Past Surgical History:  Procedure Laterality Date  . ANKLE SURGERY     remove extra bone-left  . CARPAL TUNNEL RELEASE     right side  . CARPAL TUNNEL RELEASE Left 02/06/2013   Procedure: CARPAL TUNNEL RELEASE ;  Surgeon: Carole Civil, MD;  Location: [facility];  Service: Orthopedics;  Laterality: Left;  procedure end 1135  . CARPAL TUNNEL RELEASE Right 09/14/2015   Procedure: RIGHT CARPAL TUNNEL RELEASE;  Surgeon: Carole Civil, MD;  Location: [facility];  Service: Orthopedics;  Laterality: Right;  . CARPAL TUNNEL RELEASE Left 09/28/2015   Procedure: LEFT CARPAL TUNNEL RELEASE;  Surgeon: Carole Civil, MD;  Location: [facility];  Service: Orthopedics;  Laterality: Left;  procedure 1  . CESAREAN SECTION    . CHOLECYSTECTOMY    . COLONOSCOPY WITH PROPOFOL  09/16/2012   YFR:TMYT sessile polyps ranging between 3-10m in size were found in the sigmoid colon and rectum; polypectomy was performed/Mild diverticulosis was noted throughout the entire examined colon/Small internal hemorrhoids  . DILATATION & CURETTAGE/HYSTEROSCOPY WITH MYOSURE N/A 08/05/2019   Procedure: DILATATION & CURETTAGE/HYSTEROSCOPY WITH MYOSURE;  Surgeon: DSloan Leiter MD;  Location: [facility]  Service: Gynecology;  Laterality: N/A;  myosure rep will be here.  Confirmed on 07/30/19 CS  . DILATION AND CURETTAGE OF UTERUS     multiple  . ESOPHAGOGASTRODUODENOSCOPY  07/29/2009   Mild gastritis, benign path  . ESOPHAGOGASTRODUODENOSCOPY (EGD) WITH PROPOFOL  09/16/2012   SLF:Non-erosive gastritis (inflammation) was found; multiple bx/The duodenal mucosa showed no abnormalities in the ampulla and bulb and second portion of the duodenum/NAUSEA/VOMITING MOST LIKELY DUE TO GERD/GASTRITIS  . FINGER SURGERY Left    left little finger-otif of finger  . Gastric Emptying  07/27/2009   The amount of activity in the stomach at 120 minutes was 13% which is in the normal range  . LASER ABLATION OF THE CERVIX    .  LEFT AND RIGHT HEART CATHETERIZATION WITH CORONARY ANGIOGRAM N/A 01/03/2015   Procedure: LEFT AND RIGHT HEART CATHETERIZATION WITH CORONARY ANGIOGRAM;  Surgeon: Blane Ohara, MD;  Location: [facility] CATH LAB;  Service: Cardiovascular;  Laterality: N/A;  . LITHOTRIPSY    . POLYPECTOMY  09/16/2012   Procedure: POLYPECTOMY;  Surgeon: Danie Binder, MD;  Location: [facility];  Service: Endoscopy;  Laterality: N/A;  . SAVORY DILATION  09/16/2012   Procedure: SAVORY DILATION;  Surgeon: Danie Binder, MD;  Location: [facility];  Service: Endoscopy;  Laterality: N/A;  16 fr dilation  . TRIGGER FINGER RELEASE Left 02/06/2013    Procedure: RELEASE TRIGGER FINGER LEFT LONG FINGER/A-1 PULLEY;  Surgeon: Carole Civil, MD;  Location: [facility];  Service: Orthopedics;  Laterality: Left;  procedure began 1136  . TRIGGER FINGER RELEASE Right 09/14/2015   Procedure: RIGHT LONG FINGER TRIGGER RELEASE;  Surgeon: Carole Civil, MD;  Location: [facility];  Service: Orthopedics;  Laterality: Right;  . TRIGGER FINGER RELEASE Left 09/28/2015   Procedure: LEFT RING TRIGGER FINGER RELEASE;  Surgeon: Carole Civil, MD;  Location: [facility];  Service: Orthopedics;  Laterality: Left;  left ring finger  . TUBAL LIGATION      Home Medications:  Allergies as of 03/02/2021      Reactions   Byetta 10 Mcg Pen [exenatide] Diarrhea, Nausea And Vomiting     touch of pancreatis   Naproxen Nausea And Vomiting   Pollen Extract    Augmentin [amoxicillin-pot Clavulanate]    diarrhea   Azithromycin Nausea And Vomiting, Rash   Erythromycin Hives       Invokana [canagliflozin]    Urinary issues,yeast infection   Morphine Hives, Rash   Sulfonamide Derivatives Nausea And Vomiting, Rash      Medication List       Accurate as of March 02, 2021  5:05 PM. If you have any questions, ask your nurse or doctor.        albuterol 108 (90 Base) MCG/ACT inhaler Commonly known as: Ventolin HFA INHALE 2 PUFFS INTO THE LUNGS EVERY 4 (FOUR) HOURS AS NEEDED FOR WHEEZING.   amLODipine 5 MG tablet Commonly known as: NORVASC Take 1 tablet (5 mg total) by mouth daily.   blood glucose meter kit and supplies Dispense based on patient and insurance preference. Pt checking sugars four times a day . (FOR ICD-10 E10.9, E11.9).   busPIRone 10 MG tablet Commonly known as: BUSPAR Take 1 tablet BID PRN   cetirizine 10 MG tablet Commonly known as: ZYRTEC TAKE ONE TABLET (10MG TOTAL) BY MOUTH DAILY   clopidogrel 75 MG tablet Commonly known as: PLAVIX TAKE ONE TABLET (75MG TOTAL) BY MOUTH DAILY WITH BREAKFAST   fluticasone 50 MCG/ACT nasal  spray Commonly known as: FLONASE Place 2 sprays into both nostrils daily.   HYDROcodone-acetaminophen 7.5-325 MG tablet Commonly known as: NORCO Take one tablet po every 6 hrs prn moderate pain   HYDROcodone-acetaminophen 7.5-325 MG tablet Commonly known as: NORCO Take 1 tablet by mouth every 6 (six) hours as needed for moderate pain.   HYDROcodone-acetaminophen 7.5-325 MG tablet Commonly known as: NORCO Take 1 tablet by mouth every 6 (six) hours as needed for moderate pain.   insulin detemir 100 UNIT/ML injection Commonly known as: LEVEMIR Inject 50 units in the skin in the morning and 50 units into skin qhs   lisinopril 10 MG tablet Commonly known as: ZESTRIL TAKE ONE AND ONE-HALF TABLET (30MG TOTAL) BY MOUTH DAILY   metFORMIN  1000 MG tablet Commonly known as: GLUCOPHAGE TAKE ONE TABLET (1000 MG TOTAL) BY MOUTHTWO TIMES A DAY.   metoCLOPramide 5 MG tablet Commonly known as: REGLAN Crush 1 tablet: Take approximately 20 minutes before meal when necessary use sparingly   multivitamin with minerals Tabs tablet Take 1 tablet by mouth daily.   mupirocin ointment 2 % Commonly known as: Bactroban Apply 1 application topically 2 (two) times daily.   NovoLOG 100 UNIT/ML injection Generic drug: insulin aspart Inject 10 units into skin with breakfast and 10 units into skin at supper   ondansetron 8 MG tablet Commonly known as: ZOFRAN TAKE ONE TABLET BY MOUTH EVERY 12 HOURS AS NEEDED FOR NAUSEA   OneTouch Verio test strip Generic drug: glucose blood USE AS DIRECTED TO TEST BLOOD GLUCOSE 4 TIMES DAILY.   pantoprazole 40 MG tablet Commonly known as: PROTONIX TAKE ONE TABLET (40 MG TOTAL) BY MOUTH DAILY.   promethazine 25 MG tablet Commonly known as: PHENERGAN TAKE ONE TABLET BY MOUTH EVERY EIGHT HOURS AS NEEDED FOR NAUSEA   rosuvastatin 20 MG tablet Commonly known as: CRESTOR Take 1 tablet (20 mg total) by mouth daily.   triamcinolone cream 0.1 % Commonly known as:  KENALOG Apply BID prn   trimethoprim 100 MG tablet Commonly known as: TRIMPEX Take 1 tablet (100 mg total) by mouth daily. Started by: Irine Seal, MD   venlafaxine 75 MG tablet Commonly known as: EFFEXOR TAKE ONE (1) TABLET BY MOUTH 3 TIMES DAILY       Allergies:  Allergies  Allergen Reactions  . Byetta 10 Mcg Pen [Exenatide] Diarrhea and Nausea And Vomiting      touch of pancreatis  . Naproxen Nausea And Vomiting  . Pollen Extract   . Augmentin [Amoxicillin-Pot Clavulanate]     diarrhea  . Azithromycin Nausea And Vomiting and Rash  . Erythromycin Hives       . Invokana [Canagliflozin]     Urinary issues,yeast infection  . Morphine Hives and Rash  . Sulfonamide Derivatives Nausea And Vomiting and Rash    Family History: Family History  Problem Relation Age of Onset  . Breast cancer Mother   . Diabetes Father   . Coronary artery disease Other   . Arthritis Other   . Asthma Other   . Diabetes Sister   . Colon cancer Neg Hx     Social History:  reports that she quit smoking about 33 years ago. Her smoking use included cigarettes. She has a 1.50 pack-year smoking history. She has never used smokeless tobacco. She reports that she does not drink alcohol and does not use drugs.  ROS: She has some residual numbness in her left foot and balance issues but all other review of systems were reviewed and are negative except what is noted above in HPI  Physical Exam: BP 112/69   Pulse 83   Temp 98.6 F (37 C)     Laboratory Data: Lab Results  Component Value Date   WBC 9.6 09/26/2020   HGB 13.7 09/26/2020   HCT 41.4 09/26/2020   MCV 87.9 09/26/2020   PLT 316 09/26/2020    Lab Results  Component Value Date   CREATININE 1.23 (H) 09/26/2020    No results found for: PSA  No results found for: TESTOSTERONE  Lab Results  Component Value Date   HGBA1C 6.5 (H) 09/26/2020    Urinalysis clear. Results for orders placed or performed in visit on 03/02/21 (from  the past 24 hour(s))  Urinalysis,  Routine w reflex microscopic     Status: Abnormal   Collection Time: 03/02/21  3:34 PM  Result Value Ref Range   Specific Gravity, UA >1.030 (H) 1.005 - 1.030   pH, UA 5.5 5.0 - 7.5   Color, UA Yellow Yellow   Appearance Ur Clear Clear   Leukocytes,UA Negative Negative   Protein,UA Negative Negative/Trace   Glucose, UA Negative Negative   Ketones, UA Trace (A) Negative   RBC, UA Negative Negative   Bilirubin, UA Negative Negative   Urobilinogen, Ur 0.2 0.2 - 1.0 mg/dL   Nitrite, UA Negative Negative   Microscopic Examination Comment    Narrative   Performed at:  [address] [facility] [address] [place], [place]  [id number] Lab Director: Mina Marble MT, Phone:  [phone]      Pertinent Imaging:  CLINICAL DATA:  Nephrolithiasis  EXAM: RENAL / URINARY TRACT ULTRASOUND COMPLETE  COMPARISON:  Renal stone protocol CT 01/14/2020  FINDINGS: Right Kidney:  Renal measurements: 10.7 x 4.6 x 5.9 cm = volume: 154 mL. Echogenicity within normal limits. No mass or hydronephrosis visualized.  Left Kidney:  Renal measurements: 11.0 x 5.4 x 5.3 cm = volume: 164 mL. Echogenicity within normal limits. No mass or hydronephrosis visualized.  Bladder:  Appears normal for degree of bladder distention.  Other:  Diffuse increased echogenicity of the visualized portions of the hepatic parenchyma are a nonspecific indicator of hepatocellular dysfunction, most commonly steatosis.  IMPRESSION: No sonographically evident renal calculi.  No hydronephrosis.   Electronically Signed   By: Miachel Roux M.D.   On: 02/27/2021 16:53   UA today is unremarkable.   Assessment & Plan:    1. Chronic cystitis  -UA unremarkable today. -She is back on TMP but will stop and resume prn.  I refilled the med.    -RTC 6 months with UA She is not a candidate for topical estrogen because of her CVA history.     2.  Nephrolithiasis -observation -No stones seen on RUS.  Will need repeat in a year.   Return in about 6 months (around 09/01/2021).  Irine Seal, MD  [facility] [place]

## 2021-03-23 ENCOUNTER — Encounter: Payer: Self-pay | Admitting: Family Medicine

## 2021-03-24 ENCOUNTER — Other Ambulatory Visit: Payer: Self-pay

## 2021-03-24 DIAGNOSIS — M25579 Pain in unspecified ankle and joints of unspecified foot: Secondary | ICD-10-CM

## 2021-03-24 MED ORDER — CETIRIZINE HCL 10 MG PO TABS: ORAL_TABLET | ORAL | 1 refills | Status: DC

## 2021-03-24 MED ORDER — ALBUTEROL SULFATE HFA 108 (90 BASE) MCG/ACT IN AERS: INHALATION_SPRAY | RESPIRATORY_TRACT | 5 refills | Status: DC

## 2021-03-24 MED ORDER — FLUTICASONE PROPIONATE 50 MCG/ACT NA SUSP: 2.0000 | Freq: Every day | NASAL | 3 refills | Status: DC

## 2021-03-24 MED ORDER — METFORMIN HCL 1000 MG PO TABS: ORAL_TABLET | ORAL | 1 refills | Status: DC

## 2021-03-24 MED ORDER — CLOPIDOGREL BISULFATE 75 MG PO TABS: ORAL_TABLET | ORAL | 1 refills | Status: DC

## 2021-03-24 MED ORDER — PANTOPRAZOLE SODIUM 40 MG PO TBEC: DELAYED_RELEASE_TABLET | ORAL | 1 refills | Status: DC

## 2021-03-24 NOTE — Telephone Encounter (Signed)
Nurses may have 6 months on meds please thank you

## 2021-03-24 NOTE — Addendum Note (Signed)
Addended by: Vicente Males on: 03/24/2021 10:52 AM   Modules accepted: Orders

## 2021-03-24 NOTE — Telephone Encounter (Signed)
Nurses May have 6 months on meds

## 2021-03-25 ENCOUNTER — Encounter: Payer: Self-pay | Admitting: Gastroenterology

## 2021-03-25 NOTE — Progress Notes (Signed)
Referring Provider: Kathyrn Drown, MD Primary Care Physician:  Kathyrn Drown, MD Primary Gastroenterologist:  Dr. Abbey Chatters  Chief Complaint  Patient presents with  . gastroparesis    C/o nausea, vomiting. GES in 2021 and showed a delay in her stomach emptying. Reports will go through 1 bottle of phenergan in 1 month. Was given Reglan but has not taken it yet    HPI:   Jamie Harding is a 65 y.o. female presenting today at the request of Luking, Elayne Snare, MD for gastroparesis.   Recent gastric emptying study November 2021 with 57% emptied at 4 hours, consistent with delayed gastric emptying.  Today she reports chronic history of nausea/vomiting x 10 years. Some days she is fine, other days, she will have nausea/vomiting. Gets full quickly. Eating 2 meals a day. Snacking otherwise. Hasn't taken Reglan at all. PCP told her not to take it unless she had to. Nausea/vomiting is 2-3 days a week. Woke up with nausea this morning. Took zofran which helps. Will take Phenergan if zofran doesn't work. GERD well controlled on Pantoprazole 40 mg daily. Takes tums as needed, once a week or less. No dysphagia. Intermittent epigastric pain.   Intermittent fatty foods. Limits red meat. Eats a lot of fruit and raw vegetables.   Can eat something in the morning and feel full all day. Will eat popsicles/clear liquids when she feels bad.   No NSAIDs.   Had a stroke last April. Got constipated a few weeks ago.  Once bowels moved, her stool is very large and hard.  She had a small amount of toilet tissue hematochezia at that time, but none since. Has been taking a stool softer daily, but BMs 2-3 times a week.  Prior to starting a full softener, she was going up to a week without BMs.  Denies melena.  Has been losing weight without trying over the last 5 years. 235 pounds February 2017 214 pounds April 2018 215 pounds April 2019 208 pounds April 2020 198 pounds April 2021 197 pounds today.  History of  cholecystectomy. Last EGD in 2013 with normal examined esophagus s/p empiric dilation, moderate nonerosive gastritis s/p multiple biopsies, normal examined duodenum.  Pathology with chronic inactive gastritis, no H. pylori. Gastric emptying study November 2013 normal.  Past Medical History:  Diagnosis Date  . Anxiety   . Asthma   . Depression   . Essential hypertension   . Fatty liver   . Gastritis   . Gastroparesis   . GERD (gastroesophageal reflux disease)   . Hiatal hernia   . History of cardiac catheterization    Minimal coronary atherosclerosis February 2016  . History of kidney stones   . History of stroke 2008  . HLD (hyperlipidemia)   . Neuropathy   . Obesity   . Pancreatitis   . Rheumatoid arthritis(714.0)   . Stroke ([place])   . Tibialis tendinitis   . Type 2 diabetes mellitus ([place])     Past Surgical History:  Procedure Laterality Date  . ANKLE SURGERY     remove extra bone-left  . CARPAL TUNNEL RELEASE     right side  . CARPAL TUNNEL RELEASE Left 02/06/2013   Procedure: CARPAL TUNNEL RELEASE ;  Surgeon: Carole Civil, MD;  Location: [facility];  Service: Orthopedics;  Laterality: Left;  procedure end 1135  . CARPAL TUNNEL RELEASE Right 09/14/2015   Procedure: RIGHT CARPAL TUNNEL RELEASE;  Surgeon: Carole Civil, MD;  Location: [facility];  Service: Orthopedics;  Laterality: Right;  . CARPAL TUNNEL RELEASE Left 09/28/2015   Procedure: LEFT CARPAL TUNNEL RELEASE;  Surgeon: Carole Civil, MD;  Location: [facility];  Service: Orthopedics;  Laterality: Left;  procedure 1  . CESAREAN SECTION    . CHOLECYSTECTOMY    . COLONOSCOPY WITH PROPOFOL  09/16/2012   VFI:EPPI sessile polyps ranging between 3-31m in size were found in the sigmoid colon and rectum (hyperplastic)/Mild diverticulosis was noted throughout the entire examined colon/Small internal hemorrhoids. Repeat in 10 years.  .Marland KitchenDILATATION & CURETTAGE/HYSTEROSCOPY WITH MYOSURE N/A 08/05/2019   Procedure: DILATATION  & CURETTAGE/HYSTEROSCOPY WITH MYOSURE;  Surgeon: DSloan Leiter MD;  [facility]  Service: Gynecology;  Laterality: N/A;  myosure rep will be here.  Confirmed on 07/30/19 CS  . DILATION AND CURETTAGE OF UTERUS     multiple  . ESOPHAGOGASTRODUODENOSCOPY  07/29/2009   Mild gastritis, benign path  . ESOPHAGOGASTRODUODENOSCOPY (EGD) WITH PROPOFOL  09/16/2012   SLF:Non-erosive gastritis (inflammation) was found; multiple bx/The duodenal mucosa showed no abnormalities in the ampulla and bulb and second portion of the duodenum/NAUSEA/VOMITING MOST LIKELY DUE TO GERD/GASTRITIS  . FINGER SURGERY Left    left little finger-otif of finger  . Gastric Emptying  07/27/2009   The amount of activity in the stomach at 120 minutes was 13% which is in the normal range  . LASER ABLATION OF THE CERVIX    . LEFT AND RIGHT HEART CATHETERIZATION WITH CORONARY ANGIOGRAM N/A 01/03/2015   Procedure: LEFT AND RIGHT HEART CATHETERIZATION WITH CORONARY ANGIOGRAM;  Surgeon: MBlane Ohara MD;  [facility];  Service: Cardiovascular;  Laterality: N/A;  . LITHOTRIPSY    . POLYPECTOMY  09/16/2012   Procedure: POLYPECTOMY;  Surgeon: SDanie Binder MD;  Location: [facility];  Service: Endoscopy;  Laterality: N/A;  . SAVORY DILATION  09/16/2012   Procedure: SAVORY DILATION;  Surgeon: SDanie Binder MD;  Location: [facility];  Service: Endoscopy;  Laterality: N/A;  16 fr dilation  . TRIGGER FINGER RELEASE Left 02/06/2013   Procedure: RELEASE TRIGGER FINGER LEFT LONG FINGER/A-1 PULLEY;  Surgeon: SCarole Civil MD;  Location: [facility];  Service: Orthopedics;  Laterality: Left;  procedure began 1136  . TRIGGER FINGER RELEASE Right 09/14/2015   Procedure: RIGHT LONG FINGER TRIGGER RELEASE;  Surgeon: SCarole Civil MD;  Location: [facility];  Service: Orthopedics;  Laterality: Right;  . TRIGGER FINGER RELEASE Left 09/28/2015   Procedure: LEFT RING TRIGGER FINGER RELEASE;  Surgeon: SCarole Civil  MD;  Location: [facility];  Service: Orthopedics;  Laterality: Left;  left ring finger  . TUBAL LIGATION      Current Outpatient Medications  Medication Sig Dispense Refill  . albuterol (VENTOLIN HFA) 108 (90 Base) MCG/ACT inhaler INHALE 2 PUFFS INTO THE LUNGS EVERY 4 (FOUR) HOURS AS NEEDED FOR WHEEZING. 54 g 5  . amLODipine (NORVASC) 5 MG tablet Take 1 tablet (5 mg total) by mouth daily. 90 tablet 1  . blood glucose meter kit and supplies Dispense based on patient and insurance preference. Pt checking sugars four times a day . (FOR ICD-10 E10.9, E11.9). 1 each 0  . busPIRone (BUSPAR) 10 MG tablet Take 1 tablet BID PRN 60 tablet 5  . cetirizine (ZYRTEC) 10 MG tablet TAKE ONE TABLET (10MG TOTAL) BY MOUTH DAILY 90 tablet 1  . clopidogrel (PLAVIX) 75 MG tablet TAKE ONE TABLET (75MG TOTAL) BY MOUTH DAILY WITH BREAKFAST 90 tablet 1  . fluticasone (FLONASE)  50 MCG/ACT nasal spray Place 2 sprays into both nostrils daily. 48 g 3  . HYDROcodone-acetaminophen (NORCO) 7.5-325 MG tablet Take one tablet po every 6 hrs prn moderate pain 120 tablet 0  . HYDROcodone-acetaminophen (NORCO) 7.5-325 MG tablet Take 1 tablet by mouth every 6 (six) hours as needed for moderate pain. 120 tablet 0  . HYDROcodone-acetaminophen (NORCO) 7.5-325 MG tablet Take 1 tablet by mouth every 6 (six) hours as needed for moderate pain. 120 tablet 0  . insulin detemir (LEVEMIR) 100 UNIT/ML injection Inject 50 units in the skin in the morning and 50 units into skin qhs 15 mL 5  . lisinopril (ZESTRIL) 10 MG tablet TAKE ONE AND ONE-HALF TABLET (30MG TOTAL) BY MOUTH DAILY 90 tablet 1  . metFORMIN (GLUCOPHAGE) 1000 MG tablet TAKE ONE TABLET (1000 MG TOTAL) BY MOUTHTWO TIMES A DAY. 180 tablet 1  . Multiple Vitamin (MULTIVITAMIN WITH MINERALS) TABS Take 1 tablet by mouth daily.    . mupirocin ointment (BACTROBAN) 2 % Apply 1 application topically 2 (two) times daily. 30 g 0  . NOVOLOG 100 UNIT/ML injection Inject 10 units into skin with  breakfast and 10 units into skin at supper 10 mL 5  . ondansetron (ZOFRAN) 8 MG tablet TAKE ONE TABLET BY MOUTH EVERY 12 HOURS AS NEEDED FOR NAUSEA 20 tablet 5  . ONETOUCH VERIO test strip USE AS DIRECTED TO TEST BLOOD GLUCOSE 4 TIMES DAILY. 200 strip 0  . pantoprazole (PROTONIX) 40 MG tablet Take 1 tablet (40 mg total) by mouth 2 (two) times daily before a meal. 60 tablet 3  . promethazine (PHENERGAN) 25 MG tablet TAKE ONE TABLET BY MOUTH EVERY EIGHT HOURS AS NEEDED FOR NAUSEA 30 tablet 5  . rosuvastatin (CRESTOR) 20 MG tablet Take 1 tablet (20 mg total) by mouth daily. 90 tablet 1  . triamcinolone cream (KENALOG) 0.1 % Apply BID prn 45 g 3  . trimethoprim (TRIMPEX) 100 MG tablet Take 1 tablet (100 mg total) by mouth daily. 90 tablet 3  . venlafaxine (EFFEXOR) 75 MG tablet TAKE ONE (1) TABLET BY MOUTH 3 TIMES DAILY 270 tablet 1   No current facility-administered medications for this visit.    Allergies as of 03/27/2021 - Review Complete 03/27/2021  Allergen Reaction Noted  . Byetta 10 mcg pen [exenatide] Diarrhea and Nausea And Vomiting 02/02/2013  . Naproxen Nausea And Vomiting 07/15/2009  . Pollen extract  05/05/2020  . Augmentin [amoxicillin-pot clavulanate]  03/16/2014  . Azithromycin Nausea And Vomiting and Rash 07/15/2009  . Erythromycin Hives 02/02/2013  . Invokana [canagliflozin]  09/13/2014  . Morphine Hives and Rash   . Sulfonamide derivatives Nausea And Vomiting and Rash     Family History  Problem Relation Age of Onset  . Breast cancer Mother   . Diabetes Father   . Coronary artery disease Other   . Arthritis Other   . Asthma Other   . Diabetes Sister   . Colon cancer Neg Hx     Social History   Socioeconomic History  . Marital status: Married    Spouse name: Not on file  . Number of children: Not on file  . Years of education: Not on file  . Highest education level: Not on file  Occupational History  . Occupation: Chartered certified accountant    Comment: [employer], works on  2000. (heart patients)    Employer: [employer]  Tobacco Use  . Smoking status: Former Smoker    Packs/day: 0.50    Years:  3.00    Pack years: 1.50    Types: Cigarettes    Quit date: 09/11/1987    Years since quitting: 33.5  . Smokeless tobacco: Never Used  Substance and Sexual Activity  . Alcohol use: No  . Drug use: No  . Sexual activity: Not Currently    Birth control/protection: Surgical  Other Topics Concern  . Not on file  Social History Narrative  . Not on file   Social Determinants of Health   Financial Resource Strain: Not on file  Food Insecurity: Not on file  Transportation Needs: Not on file  Physical Activity: Not on file  Stress: Not on file  Social Connections: Not on file  Intimate Partner Violence: Not on file    Review of Systems: Gen: Denies any fever, chills, cold or flulike symptoms, lightheadedness, dizziness, presyncope, syncope. CV: Denies chest pain or heart palpitations. Resp: Denies shortness of breath or cough. GI: See HPI GU : Denies urinary burning, urinary frequency, urinary hesitancy MS: History of chronic pain. Derm: Denies rash Psych: Denies depression or anxiety Heme: See HPI  Physical Exam: BP (!) 143/88   Pulse 94   Temp (!) 97.2 F (36.2 C)   Ht 5' 2"  (1.575 m)   Wt 197 lb 3.2 oz (89.4 kg)   BMI 36.07 kg/m  General:   Alert and oriented. Pleasant and cooperative. Well-nourished and well-developed.  Head:  Normocephalic and atraumatic. Eyes:  Without icterus, sclera clear and conjunctiva pink.  Ears:  Normal auditory acuity. Lungs:  Clear to auscultation bilaterally. No wheezes, rales, or rhonchi. No distress.  Heart:  S1, S2 present without murmurs appreciated.  Abdomen:  +BS, soft, and non-distended.  Mild TTP in the epigastric area.  No HSM noted. No guarding or rebound. No masses appreciated.  Rectal:  Deferred  Msk:  Symmetrical without gross deformities. Normal posture. Extremities:  Without  edema. Neurologic:  Alert and  oriented x4;  grossly normal neurologically. Skin:  Intact without significant lesions or rashes. Psych: Normal mood and affect.   Assessment: 65 year old female with history of stroke, diabetes, rheumatoid arthritis, anxiety/depression, HTN, HLD, GERD, chronic nausea with vomiting presenting today for further evaluation of chronic nausea and vomiting, early satiety, intermittent epigastric abdominal pain, and constipation.   Patient reports intermittent nausea/vomiting, early satiety, and intermittent epigastric abdominal pain x 10+ years.  Currently using Zofran or Phenergan as needed and backing down to a clear liquid diet if needed. Eating 2 meals daily due to early satiety. Associated unintentional weight loss of about 38 lbs over the last 5 years though weight seems stable over the last year. GERD well controlled on Protonix 40 mg daily.  Denies NSAIDs though she is on chronic opioid pain medication. History of cholecystectomy.  EGD in 2013 with nausea/vomiting revealed normal esophagus with empiric dilation, moderate nonerosive gastritis s/p biopsied (chronic active gastritis, no H. pylori), and normal examined duodenum.  Follow-up gastric emptying study November 2013 was normal.  Recent gastric emptying study November 2021 with 57% emptied at 4 hours consistent with delayed gastric emptying.  PCP prescribed Reglan as needed, but patient has not tried this medication as he is very concerned about possible side effects.  Suspect gastroparesis in the setting of diabetes.  Opioid pain medications may also be playing a role.  However, as her last EGD was in 2013, and gastric emptying study was normal at that time, I recommended repeating gastric emptying study to rule out other etiologies including gastritis, duodenitis, PUD, H.  pylori, pyloric stenosis.  We will also try to control her symptoms with dietary adjustments, increasing PPI to twice daily, and continuing  antiemetics as needed.  We will utilize Reglan as last resort.   Constipation: Chronic.  Not adequately managed with over-the-counter stool softener.  BMs 2-3 times per week.  Single episode of toilet tissue hematochezia when passing a large hard bowel movement.  Last colonoscopy in 2013, due for repeat in 2023.  We will try her on MiraLAX daily.  If this does not work well, we can try prescriptive medication.   Plan: 1.  Proceed with EGD with propofol with Dr. Abbey Chatters in the near future. The risks, benefits, and alternatives have been discussed with the patient in detail. The patient states understanding and desires to proceed.  *We will reach out to patient's neurologist to get the okay to hold Plavix x5 days prior to procedure. *See separate instructions for diabetes medication adjustments.  2.  Increase Protonix to 40 mg twice daily 30 minutes before breakfast and dinner.  3.  Zofran every 8 hours as needed.  Advised she may try to go ahead and take this once every morning to see if this will help prevent nausea/vomiting.  4.  Counseled on gastroparesis diet.  Written instructions and separate handout provided.  5.  Start MiraLAX 1 capful (17 g) daily in 8 ounces of water.  6.  Follow-up after procedure.  Requested progress report in 3 weeks on upper GI symptoms.  If needed, we can try low-dose Reglan while waiting on EGD to rule out other etiologies.    Aliene Altes, PA-C [facility] 03/30/2021

## 2021-03-25 NOTE — H&P (View-Only) (Signed)
Referring Provider: Kathyrn Drown, MD Primary Care Physician:  Kathyrn Drown, MD Primary Gastroenterologist:  Dr. Abbey Chatters  Chief Complaint  Patient presents with  . gastroparesis    C/o nausea, vomiting. GES in 2021 and showed a delay in her stomach emptying. Reports will go through 1 bottle of phenergan in 1 month. Was given Reglan but has not taken it yet    HPI:   Jamie Harding is a 65 y.o. female presenting today at the request of Luking, Elayne Snare, MD for gastroparesis.   Recent gastric emptying study November 2021 with 57% emptied at 4 hours, consistent with delayed gastric emptying.  Today she reports chronic history of nausea/vomiting x 10 years. Some days she is fine, other days, she will have nausea/vomiting. Gets full quickly. Eating 2 meals a day. Snacking otherwise. Hasn't taken Reglan at all. PCP told her not to take it unless she had to. Nausea/vomiting is 2-3 days a week. Woke up with nausea this morning. Took zofran which helps. Will take Phenergan if zofran doesn't work. GERD well controlled on Pantoprazole 40 mg daily. Takes tums as needed, once a week or less. No dysphagia. Intermittent epigastric pain.   Intermittent fatty foods. Limits red meat. Eats a lot of fruit and raw vegetables.   Can eat something in the morning and feel full all day. Will eat popsicles/clear liquids when she feels bad.   No NSAIDs.   Had a stroke last April. Got constipated a few weeks ago.  Once bowels moved, her stool is very large and hard.  She had a small amount of toilet tissue hematochezia at that time, but none since. Has been taking a stool softer daily, but BMs 2-3 times a week.  Prior to starting a full softener, she was going up to a week without BMs.  Denies melena.  Has been losing weight without trying over the last 5 years. 235 pounds February 2017 214 pounds April 2018 215 pounds April 2019 208 pounds April 2020 198 pounds April 2021 197 pounds today.  History of  cholecystectomy. Last EGD in 2013 with normal examined esophagus s/p empiric dilation, moderate nonerosive gastritis s/p multiple biopsies, normal examined duodenum.  Pathology with chronic inactive gastritis, no H. pylori. Gastric emptying study November 2013 normal.  Past Medical History:  Diagnosis Date  . Anxiety   . Asthma   . Depression   . Essential hypertension   . Fatty liver   . Gastritis   . Gastroparesis   . GERD (gastroesophageal reflux disease)   . Hiatal hernia   . History of cardiac catheterization    Minimal coronary atherosclerosis February 2016  . History of kidney stones   . History of stroke 2008  . HLD (hyperlipidemia)   . Neuropathy   . Obesity   . Pancreatitis   . Rheumatoid arthritis(714.0)   . Stroke ([place])   . Tibialis tendinitis   . Type 2 diabetes mellitus ([place])     Past Surgical History:  Procedure Laterality Date  . ANKLE SURGERY     remove extra bone-left  . CARPAL TUNNEL RELEASE     right side  . CARPAL TUNNEL RELEASE Left 02/06/2013   Procedure: CARPAL TUNNEL RELEASE ;  Surgeon: Carole Civil, MD;  Location: [facility];  Service: Orthopedics;  Laterality: Left;  procedure end 1135  . CARPAL TUNNEL RELEASE Right 09/14/2015   Procedure: RIGHT CARPAL TUNNEL RELEASE;  Surgeon: Carole Civil, MD;  Location: [facility];  Service: Orthopedics;  Laterality: Right;  . CARPAL TUNNEL RELEASE Left 09/28/2015   Procedure: LEFT CARPAL TUNNEL RELEASE;  Surgeon: Carole Civil, MD;  Location: [facility];  Service: Orthopedics;  Laterality: Left;  procedure 1  . CESAREAN SECTION    . CHOLECYSTECTOMY    . COLONOSCOPY WITH PROPOFOL  09/16/2012   Y[facility]:WCBJ sessile polyps ranging between 3-76m in size were found in the sigmoid colon and rectum (hyperplastic)/Mild diverticulosis was noted throughout the entire examined colon/Small internal hemorrhoids. Repeat in 10 years.  .Marland KitchenDILATATION & CURETTAGE/HYSTEROSCOPY WITH MYOSURE N/A 08/05/2019   Procedure: DILATATION  & CURETTAGE/HYSTEROSCOPY WITH MYOSURE;  Surgeon: DSloan Leiter MD;  [facility]  Service: Gynecology;  Laterality: N/A;  myosure rep will be here.  Confirmed on 07/30/19 CS  . DILATION AND CURETTAGE OF UTERUS     multiple  . ESOPHAGOGASTRODUODENOSCOPY  07/29/2009   Mild gastritis, benign path  . ESOPHAGOGASTRODUODENOSCOPY (EGD) WITH PROPOFOL  09/16/2012   SLF:Non-erosive gastritis (inflammation) was found; multiple bx/The duodenal mucosa showed no abnormalities in the ampulla and bulb and second portion of the duodenum/NAUSEA/VOMITING MOST LIKELY DUE TO GERD/GASTRITIS  . FINGER SURGERY Left    left little finger-otif of finger  . Gastric Emptying  07/27/2009   The amount of activity in the stomach at 120 minutes was 13% which is in the normal range  . LASER ABLATION OF THE CERVIX    . LEFT AND RIGHT HEART CATHETERIZATION WITH CORONARY ANGIOGRAM N/A 01/03/2015   Procedure: LEFT AND RIGHT HEART CATHETERIZATION WITH CORONARY ANGIOGRAM;  Surgeon: MBlane Ohara MD;  [facility];  Service: Cardiovascular;  Laterality: N/A;  . LITHOTRIPSY    . POLYPECTOMY  09/16/2012   Procedure: POLYPECTOMY;  Surgeon: SDanie Binder MD;  Location: [facility];  Service: Endoscopy;  Laterality: N/A;  . SAVORY DILATION  09/16/2012   Procedure: SAVORY DILATION;  Surgeon: SDanie Binder MD;  Location: [facility];  Service: Endoscopy;  Laterality: N/A;  16 fr dilation  . TRIGGER FINGER RELEASE Left 02/06/2013   Procedure: RELEASE TRIGGER FINGER LEFT LONG FINGER/A-1 PULLEY;  Surgeon: SCarole Civil MD;  Location: [facility];  Service: Orthopedics;  Laterality: Left;  procedure began 1136  . TRIGGER FINGER RELEASE Right 09/14/2015   Procedure: RIGHT LONG FINGER TRIGGER RELEASE;  Surgeon: SCarole Civil MD;  Location: [facility];  Service: Orthopedics;  Laterality: Right;  . TRIGGER FINGER RELEASE Left 09/28/2015   Procedure: LEFT RING TRIGGER FINGER RELEASE;  Surgeon: SCarole Civil  MD;  Location: [facility];  Service: Orthopedics;  Laterality: Left;  left ring finger  . TUBAL LIGATION      Current Outpatient Medications  Medication Sig Dispense Refill  . albuterol (VENTOLIN HFA) 108 (90 Base) MCG/ACT inhaler INHALE 2 PUFFS INTO THE LUNGS EVERY 4 (FOUR) HOURS AS NEEDED FOR WHEEZING. 54 g 5  . amLODipine (NORVASC) 5 MG tablet Take 1 tablet (5 mg total) by mouth daily. 90 tablet 1  . blood glucose meter kit and supplies Dispense based on patient and insurance preference. Pt checking sugars four times a day . (FOR ICD-10 E10.9, E11.9). 1 each 0  . busPIRone (BUSPAR) 10 MG tablet Take 1 tablet BID PRN 60 tablet 5  . cetirizine (ZYRTEC) 10 MG tablet TAKE ONE TABLET (10MG TOTAL) BY MOUTH DAILY 90 tablet 1  . clopidogrel (PLAVIX) 75 MG tablet TAKE ONE TABLET (75MG TOTAL) BY MOUTH DAILY WITH BREAKFAST 90 tablet 1  . fluticasone (FLONASE)  50 MCG/ACT nasal spray Place 2 sprays into both nostrils daily. 48 g 3  . HYDROcodone-acetaminophen (NORCO) 7.5-325 MG tablet Take one tablet po every 6 hrs prn moderate pain 120 tablet 0  . HYDROcodone-acetaminophen (NORCO) 7.5-325 MG tablet Take 1 tablet by mouth every 6 (six) hours as needed for moderate pain. 120 tablet 0  . HYDROcodone-acetaminophen (NORCO) 7.5-325 MG tablet Take 1 tablet by mouth every 6 (six) hours as needed for moderate pain. 120 tablet 0  . insulin detemir (LEVEMIR) 100 UNIT/ML injection Inject 50 units in the skin in the morning and 50 units into skin qhs 15 mL 5  . lisinopril (ZESTRIL) 10 MG tablet TAKE ONE AND ONE-HALF TABLET (30MG TOTAL) BY MOUTH DAILY 90 tablet 1  . metFORMIN (GLUCOPHAGE) 1000 MG tablet TAKE ONE TABLET (1000 MG TOTAL) BY MOUTHTWO TIMES A DAY. 180 tablet 1  . Multiple Vitamin (MULTIVITAMIN WITH MINERALS) TABS Take 1 tablet by mouth daily.    . mupirocin ointment (BACTROBAN) 2 % Apply 1 application topically 2 (two) times daily. 30 g 0  . NOVOLOG 100 UNIT/ML injection Inject 10 units into skin with  breakfast and 10 units into skin at supper 10 mL 5  . ondansetron (ZOFRAN) 8 MG tablet TAKE ONE TABLET BY MOUTH EVERY 12 HOURS AS NEEDED FOR NAUSEA 20 tablet 5  . ONETOUCH VERIO test strip USE AS DIRECTED TO TEST BLOOD GLUCOSE 4 TIMES DAILY. 200 strip 0  . pantoprazole (PROTONIX) 40 MG tablet Take 1 tablet (40 mg total) by mouth 2 (two) times daily before a meal. 60 tablet 3  . promethazine (PHENERGAN) 25 MG tablet TAKE ONE TABLET BY MOUTH EVERY EIGHT HOURS AS NEEDED FOR NAUSEA 30 tablet 5  . rosuvastatin (CRESTOR) 20 MG tablet Take 1 tablet (20 mg total) by mouth daily. 90 tablet 1  . triamcinolone cream (KENALOG) 0.1 % Apply BID prn 45 g 3  . trimethoprim (TRIMPEX) 100 MG tablet Take 1 tablet (100 mg total) by mouth daily. 90 tablet 3  . venlafaxine (EFFEXOR) 75 MG tablet TAKE ONE (1) TABLET BY MOUTH 3 TIMES DAILY 270 tablet 1   No current facility-administered medications for this visit.    Allergies as of 03/27/2021 - Review Complete 03/27/2021  Allergen Reaction Noted  . Byetta 10 mcg pen [exenatide] Diarrhea and Nausea And Vomiting 02/02/2013  . Naproxen Nausea And Vomiting 07/15/2009  . Pollen extract  05/05/2020  . Augmentin [amoxicillin-pot clavulanate]  03/16/2014  . Azithromycin Nausea And Vomiting and Rash 07/15/2009  . Erythromycin Hives 02/02/2013  . Invokana [canagliflozin]  09/13/2014  . Morphine Hives and Rash   . Sulfonamide derivatives Nausea And Vomiting and Rash     Family History  Problem Relation Age of Onset  . Breast cancer Mother   . Diabetes Father   . Coronary artery disease Other   . Arthritis Other   . Asthma Other   . Diabetes Sister   . Colon cancer Neg Hx     Social History   Socioeconomic History  . Marital status: Married    Spouse name: Not on file  . Number of children: Not on file  . Years of education: Not on file  . Highest education level: Not on file  Occupational History  . Occupation: Chartered certified accountant    Comment: [employer], works on  2000. (heart patients)    Employer: [employer]  Tobacco Use  . Smoking status: Former Smoker    Packs/day: 0.50    Years:  3.00    Pack years: 1.50    Types: Cigarettes    Quit date: 09/11/1987    Years since quitting: 33.5  . Smokeless tobacco: Never Used  Substance and Sexual Activity  . Alcohol use: No  . Drug use: No  . Sexual activity: Not Currently    Birth control/protection: Surgical  Other Topics Concern  . Not on file  Social History Narrative  . Not on file   Social Determinants of Health   Financial Resource Strain: Not on file  Food Insecurity: Not on file  Transportation Needs: Not on file  Physical Activity: Not on file  Stress: Not on file  Social Connections: Not on file  Intimate Partner Violence: Not on file    Review of Systems: Gen: Denies any fever, chills, cold or flulike symptoms, lightheadedness, dizziness, presyncope, syncope. CV: Denies chest pain or heart palpitations. Resp: Denies shortness of breath or cough. GI: See HPI GU : Denies urinary burning, urinary frequency, urinary hesitancy MS: History of chronic pain. Derm: Denies rash Psych: Denies depression or anxiety Heme: See HPI  Physical Exam: BP (!) 143/88   Pulse 94   Temp (!) 97.2 F (36.2 C)   Ht 5' 2"  (1.575 m)   Wt 197 lb 3.2 oz (89.4 kg)   BMI 36.07 kg/m  General:   Alert and oriented. Pleasant and cooperative. Well-nourished and well-developed.  Head:  Normocephalic and atraumatic. Eyes:  Without icterus, sclera clear and conjunctiva pink.  Ears:  Normal auditory acuity. Lungs:  Clear to auscultation bilaterally. No wheezes, rales, or rhonchi. No distress.  Heart:  S1, S2 present without murmurs appreciated.  Abdomen:  +BS, soft, and non-distended.  Mild TTP in the epigastric area.  No HSM noted. No guarding or rebound. No masses appreciated.  Rectal:  Deferred  Msk:  Symmetrical without gross deformities. Normal posture. Extremities:  Without  edema. Neurologic:  Alert and  oriented x4;  grossly normal neurologically. Skin:  Intact without significant lesions or rashes. Psych: Normal mood and affect.   Assessment: 65 year old female with history of stroke, diabetes, rheumatoid arthritis, anxiety/depression, HTN, HLD, GERD, chronic nausea with vomiting presenting today for further evaluation of chronic nausea and vomiting, early satiety, intermittent epigastric abdominal pain, and constipation.   Patient reports intermittent nausea/vomiting, early satiety, and intermittent epigastric abdominal pain x 10+ years.  Currently using Zofran or Phenergan as needed and backing down to a clear liquid diet if needed. Eating 2 meals daily due to early satiety. Associated unintentional weight loss of about 38 lbs over the last 5 years though weight seems stable over the last year. GERD well controlled on Protonix 40 mg daily.  Denies NSAIDs though she is on chronic opioid pain medication. History of cholecystectomy.  EGD in 2013 with nausea/vomiting revealed normal esophagus with empiric dilation, moderate nonerosive gastritis s/p biopsied (chronic active gastritis, no H. pylori), and normal examined duodenum.  Follow-up gastric emptying study November 2013 was normal.  Recent gastric emptying study November 2021 with 57% emptied at 4 hours consistent with delayed gastric emptying.  PCP prescribed Reglan as needed, but patient has not tried this medication as he is very concerned about possible side effects.  Suspect gastroparesis in the setting of diabetes.  Opioid pain medications may also be playing a role.  However, as her last EGD was in 2013, and gastric emptying study was normal at that time, I recommended repeating gastric emptying study to rule out other etiologies including gastritis, duodenitis, PUD, H.  pylori, pyloric stenosis.  We will also try to control her symptoms with dietary adjustments, increasing PPI to twice daily, and continuing  antiemetics as needed.  We will utilize Reglan as last resort.   Constipation: Chronic.  Not adequately managed with over-the-counter stool softener.  BMs 2-3 times per week.  Single episode of toilet tissue hematochezia when passing a large hard bowel movement.  Last colonoscopy in 2013, due for repeat in 2023.  We will try her on MiraLAX daily.  If this does not work well, we can try prescriptive medication.   Plan: 1.  Proceed with EGD with propofol with Dr. Abbey Chatters in the near future. The risks, benefits, and alternatives have been discussed with the patient in detail. The patient states understanding and desires to proceed.  *We will reach out to patient's neurologist to get the okay to hold Plavix x5 days prior to procedure. *See separate instructions for diabetes medication adjustments.  2.  Increase Protonix to 40 mg twice daily 30 minutes before breakfast and dinner.  3.  Zofran every 8 hours as needed.  Advised she may try to go ahead and take this once every morning to see if this will help prevent nausea/vomiting.  4.  Counseled on gastroparesis diet.  Written instructions and separate handout provided.  5.  Start MiraLAX 1 capful (17 g) daily in 8 ounces of water.  6.  Follow-up after procedure.  Requested progress report in 3 weeks on upper GI symptoms.  If needed, we can try low-dose Reglan while waiting on EGD to rule out other etiologies.    Aliene Altes, PA-C [facility] 03/30/2021

## 2021-03-27 ENCOUNTER — Other Ambulatory Visit: Payer: Self-pay

## 2021-03-27 ENCOUNTER — Ambulatory Visit (INDEPENDENT_AMBULATORY_CARE_PROVIDER_SITE_OTHER): Payer: PPO | Admitting: Gastroenterology

## 2021-03-27 ENCOUNTER — Encounter: Payer: Self-pay | Admitting: Gastroenterology

## 2021-03-27 VITALS — BP 143/88 | HR 94 | Temp 97.2°F | Ht 62.0 in | Wt 197.2 lb

## 2021-03-27 DIAGNOSIS — K5909 Other constipation: Secondary | ICD-10-CM | POA: Insufficient documentation

## 2021-03-27 DIAGNOSIS — R1010 Upper abdominal pain, unspecified: Secondary | ICD-10-CM | POA: Diagnosis not present

## 2021-03-27 DIAGNOSIS — K3184 Gastroparesis: Secondary | ICD-10-CM

## 2021-03-27 DIAGNOSIS — R112 Nausea with vomiting, unspecified: Secondary | ICD-10-CM | POA: Diagnosis not present

## 2021-03-27 DIAGNOSIS — R1110 Vomiting, unspecified: Secondary | ICD-10-CM | POA: Insufficient documentation

## 2021-03-27 DIAGNOSIS — K5900 Constipation, unspecified: Secondary | ICD-10-CM | POA: Diagnosis not present

## 2021-03-27 DIAGNOSIS — K219 Gastro-esophageal reflux disease without esophagitis: Secondary | ICD-10-CM

## 2021-03-27 MED ORDER — PANTOPRAZOLE SODIUM 40 MG PO TBEC: 40.0000 mg | DELAYED_RELEASE_TABLET | Freq: Two times a day (BID) | ORAL | 3 refills | Status: DC

## 2021-03-27 NOTE — Patient Instructions (Addendum)
We will arrange for you to have an upper endoscopy in the near future with Dr. Abbey Chatters. Will reach out to Dr. Merlene Laughter to get the okay to hold Plavix for 5 days prior to your procedure. 1 day prior to procedure:   You may take your sliding scale insulin as prescribed  Take your morning Levemir as prescribed.  Take one half of your evening dose of Levemir (25 units at bedtime).  Take your morning dose of metformin as prescribed.  Take one half dose of your evening metformin (500 mg) Day of your procedure: Do not take any morning diabetes medications.  Increase Protonix to 40 mg twice daily 30 minutes before breakfast and dinner.  Continue taking Zofran every 8 hours as needed.  You can try taking this once every morning to see if this will help prevent nausea/vomiting.   Follow a gastroparesis diet: Avoid eating a lot of raw fruits and vegetables. Follow a low-fat diet.  No fried, fatty, greasy foods.  All meats should be baked, boiled, or broiled. Eat 4-6 small meals daily. Do not eat within 3 hours of laying down.  See separate gastroparesis diet handout.  For constipation: Try MiraLAX 1 capful (17 g) daily in 8 ounces of water.  Please call with a progress report in 3 weeks and let me know how your gastroparesis symptoms are doing.  If you continues with symptoms, we can try low-dose Reglan.  I will plan to see back in the office after your procedure.   Does great to meet you today!  Aliene Altes, PA-C [facility]

## 2021-03-28 ENCOUNTER — Telehealth: Payer: Self-pay | Admitting: *Deleted

## 2021-03-28 NOTE — Telephone Encounter (Signed)
Noted.  Routing to [facility] clinical pool to arrange EGD with propofol with Dr. Abbey Chatters. ASA III  Medication adjustments: Hold Plavix x5 days prior to procedure.  1 day prior to procedure:              Take sliding scale insulin as prescribed if needed.             Take metformin as prescribed. *Please note I changed these instructions.              Take morning Levemir as prescribed.  Take one half of evening dose of Levemir (25 units at bedtime).  Day of procedure: No AM diabetes medications.

## 2021-03-28 NOTE — Telephone Encounter (Signed)
Tried to call pt, unable to leave voicemail d/t mailbox if full.

## 2021-03-28 NOTE — Telephone Encounter (Signed)
Spoke to [place] at Dr. Freddie Apley office.  She said that it is ok to hold plavix 5 days prior to procedure.  She is going to fax something to [name] as well.  FYI to [employer], [place].

## 2021-03-29 NOTE — Telephone Encounter (Signed)
Letter mailed

## 2021-03-29 NOTE — Telephone Encounter (Signed)
Tried to call mobile#, voicemail box is full. Tried to call home#, no answer.

## 2021-03-30 ENCOUNTER — Ambulatory Visit: Payer: PPO | Admitting: Family Medicine

## 2021-03-30 ENCOUNTER — Encounter: Payer: Self-pay | Admitting: Gastroenterology

## 2021-03-31 NOTE — Progress Notes (Signed)
CC'ED TO PCP 

## 2021-04-06 ENCOUNTER — Telehealth: Payer: Self-pay | Admitting: Internal Medicine

## 2021-04-06 ENCOUNTER — Ambulatory Visit (INDEPENDENT_AMBULATORY_CARE_PROVIDER_SITE_OTHER): Payer: PPO | Admitting: Family Medicine

## 2021-04-06 ENCOUNTER — Other Ambulatory Visit: Payer: Self-pay

## 2021-04-06 VITALS — BP 98/64 | HR 80 | Temp 97.3°F | Ht 62.0 in | Wt 199.0 lb

## 2021-04-06 DIAGNOSIS — E785 Hyperlipidemia, unspecified: Secondary | ICD-10-CM | POA: Diagnosis not present

## 2021-04-06 DIAGNOSIS — I10 Essential (primary) hypertension: Secondary | ICD-10-CM | POA: Diagnosis not present

## 2021-04-06 DIAGNOSIS — Z1231 Encounter for screening mammogram for malignant neoplasm of breast: Secondary | ICD-10-CM | POA: Diagnosis not present

## 2021-04-06 DIAGNOSIS — E1169 Type 2 diabetes mellitus with other specified complication: Secondary | ICD-10-CM | POA: Diagnosis not present

## 2021-04-06 DIAGNOSIS — G894 Chronic pain syndrome: Secondary | ICD-10-CM

## 2021-04-06 DIAGNOSIS — F325 Major depressive disorder, single episode, in full remission: Secondary | ICD-10-CM | POA: Diagnosis not present

## 2021-04-06 MED ORDER — TRIAMCINOLONE ACETONIDE 0.1 % EX CREA: TOPICAL_CREAM | CUTANEOUS | 3 refills | Status: DC

## 2021-04-06 MED ORDER — HYDROCODONE-ACETAMINOPHEN 7.5-325 MG PO TABS: 1.0000 | ORAL_TABLET | Freq: Four times a day (QID) | ORAL | 0 refills | Status: DC | PRN

## 2021-04-06 MED ORDER — HYDROCODONE-ACETAMINOPHEN 7.5-325 MG PO TABS: ORAL_TABLET | ORAL | 0 refills | Status: DC

## 2021-04-06 NOTE — Telephone Encounter (Signed)
Spoke to pt, EGD scheduled for 04/25/21 at 1:15pm. Informed  her to hold Plavix for 5 days prior to procedure. Orders entered.

## 2021-04-06 NOTE — Telephone Encounter (Signed)
Pre-op/covid test 04/24/21 at 10:15am. Appt letter mailed with procedure instructions.

## 2021-04-06 NOTE — Telephone Encounter (Signed)
Pt received letter about scheduling her endoscopy and holding her plavix, Please call [phone]

## 2021-04-06 NOTE — Progress Notes (Signed)
Subjective:    Patient ID: Jamie Harding, female    DOB: 13-Oct-1956, 65 y.o.   MRN: [id number]  HPI  This patient was seen today for chronic pain  The medication list was reviewed and updated.  Location of Pain for which the patient has been treated with regarding narcotics: Chronic neck pain and low back pain  Onset of this pain: chronic   -Compliance with medication: takes daily  - Number patient states they take daily: 4 pills  -when was the last dose patient took? Today 8 am   The patient was advised the importance of maintaining medication and not using illegal substances with these.  Here for refills and follow up  The patient was educated that we can provide 3 monthly scripts for their medication, it is their responsibility to follow the instructions.  Side effects or complications from medications: none  Patient is aware that pain medications are meant to minimize the severity of the pain to allow their pain levels to improve to allow for better function. They are aware of that pain medications cannot totally remove their pain.  Due for UDT ( at least once per year) : On next visit  Scale of 1 to 10 ( 1 is least 10 is most) Your pain level without the medicine: 10 Your pain level with medication 4  Scale 1 to 10 ( 1-helps very little, 10 helps very well) How well does your pain medication reduce your pain so you can function better through out the day? Very well  Quality of the pain: dull and achy   Persistence of the pain: all the time  Modifying factors: some times resting   Rash on abdomen x 2 weeks  Moods are doing well denies being depressed  Review of Systems     Objective:   Physical Exam Rashes noted on the lower  Abdomen small erythematous area some excoriations Abdomen moderately obese Extremities no edema Lungs clear heart regular pulse normal        Assessment & Plan:  1. Hyperlipidemia associated with type 2 diabetes mellitus  ([place]) Continue medication watch diet closely check A1c await results - Hemoglobin A1c - Comprehensive metabolic panel - Lipid panel - Urine Microalbumin w/creat. ratio  2. Essential hypertension Blood pressure good control continue current measures - Hemoglobin A1c - Comprehensive metabolic panel - Lipid panel - Urine Microalbumin w/creat. ratio  3. Encounter for screening mammogram for breast cancer Mammogram recommended - MM DIGITAL SCREENING BILATERAL  4. Chronic pain syndrome The patient was seen in followup for chronic pain. A review over at their current pain status was discussed. Drug registry was checked. Prescriptions were given.  Regular follow-up recommended. Discussion was held regarding the importance of compliance with medication as well as pain medication contract.  Patient was informed that medication may cause drowsiness and should not be combined  with other medications/alcohol or street drugs. If the patient feels medication is causing altered alertness then do not drive or operate dangerous equipment.  Drug registry checked 3 scripts given pain medication does help her without it she would have a difficult time functioning - HYDROcodone-acetaminophen (NORCO) 7.5-325 MG tablet; Take one tablet po every 6 hrs prn moderate pain  Dispense: 120 tablet; Refill: 0 - HYDROcodone-acetaminophen (NORCO) 7.5-325 MG tablet; Take 1 tablet by mouth every 6 (six) hours as needed for moderate pain.  Dispense: 120 tablet; Refill: 0 - HYDROcodone-acetaminophen (NORCO) 7.5-325 MG tablet; Take 1 tablet by mouth every 6 (six)  hours as needed for moderate pain.  Dispense: 120 tablet; Refill: 0  5. Depression, major, single episode, complete remission ([place]) Under good control currently on Effexor  6. Morbid obesity due to excess calories ([place]) Watching portions cutting back

## 2021-04-12 DIAGNOSIS — E785 Hyperlipidemia, unspecified: Secondary | ICD-10-CM | POA: Diagnosis not present

## 2021-04-12 DIAGNOSIS — I10 Essential (primary) hypertension: Secondary | ICD-10-CM | POA: Diagnosis not present

## 2021-04-12 DIAGNOSIS — E1169 Type 2 diabetes mellitus with other specified complication: Secondary | ICD-10-CM | POA: Diagnosis not present

## 2021-04-13 LAB — COMPREHENSIVE METABOLIC PANEL
AG Ratio: 1.4 (calc) (ref 1.0–2.5)
ALT: 11 U/L (ref 6–29)
AST: 13 U/L (ref 10–35)
Albumin: 4.2 g/dL (ref 3.6–5.1)
Alkaline phosphatase (APISO): 56 U/L (ref 37–153)
BUN/Creatinine Ratio: 10 (calc) (ref 6–22)
BUN: 10 mg/dL (ref 7–25)
CO2: 24 mmol/L (ref 20–32)
Calcium: 9.1 mg/dL (ref 8.6–10.4)
Chloride: 103 mmol/L (ref 98–110)
Creat: 1.02 mg/dL — ABNORMAL HIGH (ref 0.50–0.99)
Globulin: 3 g/dL (calc) (ref 1.9–3.7)
Glucose, Bld: 260 mg/dL — ABNORMAL HIGH (ref 65–139)
Potassium: 4 mmol/L (ref 3.5–5.3)
Sodium: 136 mmol/L (ref 135–146)
Total Bilirubin: 0.4 mg/dL (ref 0.2–1.2)
Total Protein: 7.2 g/dL (ref 6.1–8.1)

## 2021-04-13 LAB — MICROALBUMIN / CREATININE URINE RATIO
Creatinine, Urine: 211 mg/dL (ref 20–275)
Microalb Creat Ratio: 37 mcg/mg creat — ABNORMAL HIGH (ref <30)
Microalb, Ur: 7.9 mg/dL

## 2021-04-13 LAB — LIPID PANEL
Cholesterol: 168 mg/dL (ref <200)
HDL: 59 mg/dL (ref >=50)
LDL Cholesterol (Calc): 87 mg/dL (calc)
Non-HDL Cholesterol (Calc): 109 mg/dL (calc) (ref <130)
Total CHOL/HDL Ratio: 2.8 (calc) (ref <5.0)
Triglycerides: 119 mg/dL (ref <150)

## 2021-04-13 LAB — HEMOGLOBIN A1C
Hgb A1c MFr Bld: 8.3 % of total Hgb — ABNORMAL HIGH (ref <5.7)
Mean Plasma Glucose: 192 mg/dL
eAG (mmol/L): 10.6 mmol/L

## 2021-04-18 ENCOUNTER — Telehealth: Payer: Self-pay | Admitting: Family Medicine

## 2021-04-18 NOTE — Telephone Encounter (Signed)
[facility] calling to get verification on Lisinopril dose. Pt requested refill on Lisinopril 10 mg with directions to take one and one half tablet by mouth daily for total of 30 mg. Pharmacy needing clarification as to whether patient should be on 10 mg or 30 mg. Please advise. Thank you

## 2021-04-19 ENCOUNTER — Telehealth: Payer: Self-pay

## 2021-04-19 NOTE — Telephone Encounter (Signed)
Not sure how that came to be  It is my understanding that the patient is on 10 mg lisinopril once daily.  Please verify with patient.  If that is the case please send them 10 mg lisinopril 1 daily #90 with 1 refill  If for some reason she is taking a different dosage please let me know

## 2021-04-19 NOTE — Telephone Encounter (Signed)
Left message to return call 

## 2021-04-19 NOTE — Telephone Encounter (Signed)
Call received from [facility] requesting clarification on lisinopril dose. Take 1.5 tab or 1 tab daily of lisinopril. Please advise

## 2021-04-20 MED ORDER — LISINOPRIL 10 MG PO TABS: 10.0000 mg | ORAL_TABLET | Freq: Every day | ORAL | 1 refills | Status: DC

## 2021-04-20 NOTE — Telephone Encounter (Signed)
Patient states she takes Lisinopril 10 mg one a day. Prescription sent electronically to pharmacy. Patient notified.

## 2021-04-20 NOTE — Addendum Note (Signed)
Addended by: Dairl Ponder on: 04/20/2021 11:53 AM   Modules accepted: Orders

## 2021-04-20 NOTE — Telephone Encounter (Signed)
Nurses please see other message.  Please clarify with [place] exactly what she is taking then we can go from there

## 2021-04-21 NOTE — Patient Instructions (Signed)
Jamie Harding  [birth year]     @PREFPERIOPPHARMACY @   Your procedure is scheduled on  04/25/2021.    Report to [name] at 1145  A.M.   Call this number if you have problems the morning of surgery:  [phone]   Remember:  Follow the dit instructions given to you by the office.        Your last dose of plavix should have been 04/19/2021.          The night before your procedure, take 25 units of your levemir.  DO NOT take any medications for diabetes the morning of you procedure.     Take these medicines the morning of surgery with A SIP OF WATER  Amlodipine, buspar, zyrtec, hydorocodone (if needed), zofran (if needed), protonix, phenergan (if needed), trimpex, effexor.  Use your inhalers before you come and bring your rescue inhaler with you.     Please brush your teeth.  Do not wear jewelry, make-up or nail polish.  Do not wear lotions, powders, or perfumes, or deodorant.  Do not shave 48 hours prior to surgery.  Men may shave face and neck.  Do not bring valuables to the hospital.  [facility] is not responsible for any belongings or valuables.  Contacts, dentures or bridgework may not be worn into surgery.  Leave your suitcase in the car.  After surgery it may be brought to your room.  For patients admitted to the hospital, discharge time will be determined by your treatment team.  Patients discharged the day of surgery will not be allowed to drive home and must have someone with them for 24 hours .      Special instructions:  DO NOT smoke tobacco or vape for 24 hours before your procedure.  Please read over the following fact sheets that you were given. Anesthesia Post-op Instructions and Care and Recovery After Surgery       Upper Endoscopy, Adult, Care After This sheet gives you information about how to care for yourself after your procedure. Your health care provider may also give you more specific instructions. If you have problems or  questions, contact your health care provider. What can I expect after the procedure? After the procedure, it is common to have:  A sore throat.  Mild stomach pain or discomfort.  Bloating.  Nausea. Follow these instructions at home:  Follow instructions from your health care provider about what to eat or drink after your procedure.  Return to your normal activities as told by your health care provider. Ask your health care provider what activities are safe for you.  Take over-the-counter and prescription medicines only as told by your health care provider.  If you were given a sedative during the procedure, it can affect you for several hours. Do not drive or operate machinery until your health care provider says that it is safe.  Keep all follow-up visits as told by your health care provider. This is important.   Contact a health care provider if you have:  A sore throat that lasts longer than one day.  Trouble swallowing. Get help right away if:  You vomit blood or your vomit looks like coffee grounds.  You have: ? A fever. ? Bloody, black, or tarry stools. ? A severe sore throat or you cannot swallow. ? Difficulty breathing. ? Severe pain in your chest or abdomen. Summary  After the procedure, it is common to have a sore throat,  mild stomach discomfort, bloating, and nausea.  If you were given a sedative during the procedure, it can affect you for several hours. Do not drive or operate machinery until your health care provider says that it is safe.  Follow instructions from your health care provider about what to eat or drink after your procedure.  Return to your normal activities as told by your health care provider. This information is not intended to replace advice given to you by your health care provider. Make sure you discuss any questions you have with your health care provider. Document Revised: 11/03/2019 Document Reviewed: 04/07/2018 Elsevier Patient  Education  2021 [place] After This sheet gives you information about how to care for yourself after your procedure. Your health care provider may also give you more specific instructions. If you have problems or questions, contact your health care provider. What can I expect after the procedure? After the procedure, it is common to have:  Tiredness.  Forgetfulness about what happened after the procedure.  Impaired judgment for important decisions.  Nausea or vomiting.  Some difficulty with balance. Follow these instructions at home: For the time period you were told by your health care provider:  Rest as needed.  Do not participate in activities where you could fall or become injured.  Do not drive or use machinery.  Do not drink alcohol.  Do not take sleeping pills or medicines that cause drowsiness.  Do not make important decisions or sign legal documents.  Do not take care of children on your own.      Eating and drinking  Follow the diet that is recommended by your health care provider.  Drink enough fluid to keep your urine pale yellow.  If you vomit: ? Drink water, juice, or soup when you can drink without vomiting. ? Make sure you have little or no nausea before eating solid foods. General instructions  Have a responsible adult stay with you for the time you are told. It is important to have someone help care for you until you are awake and alert.  Take over-the-counter and prescription medicines only as told by your health care provider.  If you have sleep apnea, surgery and certain medicines can increase your risk for breathing problems. Follow instructions from your health care provider about wearing your sleep device: ? Anytime you are sleeping, including during daytime naps. ? While taking prescription pain medicines, sleeping medicines, or medicines that make you drowsy.  Avoid smoking.  Keep all follow-up  visits as told by your health care provider. This is important. Contact a health care provider if:  You keep feeling nauseous or you keep vomiting.  You feel light-headed.  You are still sleepy or having trouble with balance after 24 hours.  You develop a rash.  You have a fever.  You have redness or swelling around the IV site. Get help right away if:  You have trouble breathing.  You have new-onset confusion at home. Summary  For several hours after your procedure, you may feel tired. You may also be forgetful and have poor judgment.  Have a responsible adult stay with you for the time you are told. It is important to have someone help care for you until you are awake and alert.  Rest as told. Do not drive or operate machinery. Do not drink alcohol or take sleeping pills.  Get help right away if you have trouble breathing, or if you suddenly become confused.  This information is not intended to replace advice given to you by your health care provider. Make sure you discuss any questions you have with your health care provider. Document Revised: 07/21/2020 Document Reviewed: 10/08/2019 Elsevier Patient Education  2021 [employer].

## 2021-04-24 ENCOUNTER — Encounter (HOSPITAL_COMMUNITY): Payer: Self-pay

## 2021-04-24 ENCOUNTER — Other Ambulatory Visit: Payer: Self-pay

## 2021-04-24 ENCOUNTER — Other Ambulatory Visit (HOSPITAL_COMMUNITY): Admission: RE | Admit: 2021-04-24 | Payer: PPO | Source: Ambulatory Visit

## 2021-04-24 ENCOUNTER — Encounter (HOSPITAL_COMMUNITY)
Admission: RE | Admit: 2021-04-24 | Discharge: 2021-04-24 | Disposition: A | Discharge status: Home / Self Care | Payer: PPO | Source: Ambulatory Visit | Attending: Internal Medicine | Admitting: Internal Medicine

## 2021-04-24 DIAGNOSIS — Z01810 Encounter for preprocedural cardiovascular examination: Secondary | ICD-10-CM | POA: Insufficient documentation

## 2021-04-25 ENCOUNTER — Encounter (HOSPITAL_COMMUNITY)
Admission: RE | Disposition: A | Discharge status: Home / Self Care | Payer: Self-pay | Source: Home / Self Care | Attending: Internal Medicine

## 2021-04-25 ENCOUNTER — Encounter (HOSPITAL_COMMUNITY): Payer: Self-pay

## 2021-04-25 ENCOUNTER — Ambulatory Visit (HOSPITAL_COMMUNITY): Payer: PPO | Admitting: Anesthesiology

## 2021-04-25 ENCOUNTER — Ambulatory Visit (HOSPITAL_COMMUNITY)
Admission: RE | Admit: 2021-04-25 | Discharge: 2021-04-25 | Disposition: A | Discharge status: Home / Self Care | Payer: PPO | Attending: Internal Medicine | Admitting: Internal Medicine

## 2021-04-25 DIAGNOSIS — K219 Gastro-esophageal reflux disease without esophagitis: Secondary | ICD-10-CM | POA: Diagnosis not present

## 2021-04-25 DIAGNOSIS — Z794 Long term (current) use of insulin: Secondary | ICD-10-CM | POA: Insufficient documentation

## 2021-04-25 DIAGNOSIS — Z87891 Personal history of nicotine dependence: Secondary | ICD-10-CM | POA: Insufficient documentation

## 2021-04-25 DIAGNOSIS — K3184 Gastroparesis: Secondary | ICD-10-CM

## 2021-04-25 DIAGNOSIS — Z881 Allergy status to other antibiotic agents status: Secondary | ICD-10-CM | POA: Insufficient documentation

## 2021-04-25 DIAGNOSIS — R110 Nausea: Secondary | ICD-10-CM | POA: Diagnosis not present

## 2021-04-25 DIAGNOSIS — M069 Rheumatoid arthritis, unspecified: Secondary | ICD-10-CM | POA: Diagnosis not present

## 2021-04-25 DIAGNOSIS — Z8673 Personal history of transient ischemic attack (TIA), and cerebral infarction without residual deficits: Secondary | ICD-10-CM | POA: Insufficient documentation

## 2021-04-25 DIAGNOSIS — Z7984 Long term (current) use of oral hypoglycemic drugs: Secondary | ICD-10-CM | POA: Diagnosis not present

## 2021-04-25 DIAGNOSIS — E1143 Type 2 diabetes mellitus with diabetic autonomic (poly)neuropathy: Secondary | ICD-10-CM | POA: Diagnosis not present

## 2021-04-25 DIAGNOSIS — Z888 Allergy status to other drugs, medicaments and biological substances status: Secondary | ICD-10-CM | POA: Insufficient documentation

## 2021-04-25 DIAGNOSIS — Z833 Family history of diabetes mellitus: Secondary | ICD-10-CM | POA: Diagnosis not present

## 2021-04-25 DIAGNOSIS — Z8249 Family history of ischemic heart disease and other diseases of the circulatory system: Secondary | ICD-10-CM | POA: Diagnosis not present

## 2021-04-25 DIAGNOSIS — Z882 Allergy status to sulfonamides status: Secondary | ICD-10-CM | POA: Diagnosis not present

## 2021-04-25 DIAGNOSIS — R112 Nausea with vomiting, unspecified: Secondary | ICD-10-CM | POA: Diagnosis not present

## 2021-04-25 DIAGNOSIS — Z791 Long term (current) use of non-steroidal anti-inflammatories (NSAID): Secondary | ICD-10-CM | POA: Diagnosis not present

## 2021-04-25 DIAGNOSIS — Z79899 Other long term (current) drug therapy: Secondary | ICD-10-CM | POA: Diagnosis not present

## 2021-04-25 DIAGNOSIS — Z885 Allergy status to narcotic agent status: Secondary | ICD-10-CM | POA: Insufficient documentation

## 2021-04-25 DIAGNOSIS — R109 Unspecified abdominal pain: Secondary | ICD-10-CM | POA: Diagnosis not present

## 2021-04-25 DIAGNOSIS — K2950 Unspecified chronic gastritis without bleeding: Secondary | ICD-10-CM | POA: Insufficient documentation

## 2021-04-25 DIAGNOSIS — Z8261 Family history of arthritis: Secondary | ICD-10-CM | POA: Diagnosis not present

## 2021-04-25 DIAGNOSIS — Z7951 Long term (current) use of inhaled steroids: Secondary | ICD-10-CM | POA: Insufficient documentation

## 2021-04-25 DIAGNOSIS — Z7902 Long term (current) use of antithrombotics/antiplatelets: Secondary | ICD-10-CM | POA: Insufficient documentation

## 2021-04-25 DIAGNOSIS — R12 Heartburn: Secondary | ICD-10-CM | POA: Diagnosis not present

## 2021-04-25 DIAGNOSIS — Z803 Family history of malignant neoplasm of breast: Secondary | ICD-10-CM | POA: Insufficient documentation

## 2021-04-25 DIAGNOSIS — K2970 Gastritis, unspecified, without bleeding: Secondary | ICD-10-CM | POA: Diagnosis not present

## 2021-04-25 HISTORY — PX: BIOPSY: SHX5522

## 2021-04-25 HISTORY — PX: ESOPHAGOGASTRODUODENOSCOPY (EGD) WITH PROPOFOL: SHX5813

## 2021-04-25 LAB — GLUCOSE, CAPILLARY
Glucose-Capillary: 186 mg/dL — ABNORMAL HIGH (ref 70–99)
Glucose-Capillary: 159 mg/dL — ABNORMAL HIGH (ref 70–99)

## 2021-04-25 SURGERY — ESOPHAGOGASTRODUODENOSCOPY (EGD) WITH PROPOFOL
Anesthesia: General

## 2021-04-25 MED ORDER — LIDOCAINE HCL (CARDIAC) PF 100 MG/5ML IV SOSY
PREFILLED_SYRINGE | INTRAVENOUS | Status: DC | PRN
  Administered 2021-04-25: 50 mg via INTRAVENOUS

## 2021-04-25 MED ORDER — PROPOFOL 10 MG/ML IV BOLUS
INTRAVENOUS | Status: AC
  Filled 2021-04-25: qty 40

## 2021-04-25 MED ORDER — PROPOFOL 10 MG/ML IV BOLUS
INTRAVENOUS | Status: DC | PRN
  Administered 2021-04-25: 80 mg via INTRAVENOUS
  Administered 2021-04-25: 40 mg via INTRAVENOUS
  Administered 2021-04-25: 50 mg via INTRAVENOUS

## 2021-04-25 MED ORDER — STERILE WATER FOR IRRIGATION IR SOLN
Status: DC | PRN
  Administered 2021-04-25: 100 mL

## 2021-04-25 MED ORDER — LACTATED RINGERS IV SOLN: INTRAVENOUS | Status: DC

## 2021-04-25 NOTE — Op Note (Signed)
[facility] Patient Name: Jamie Harding Procedure Date: 04/25/2021 1:00 PM MRN: [id number] Date of Birth: 1956/02/17 Attending MD: Elon Alas. Abbey Chatters DO CSN: [id number] Age: 65 Admit Type: Outpatient Procedure:                Upper GI endoscopy Indications:              Heartburn, Gastroparesis, Nausea Providers:                Elon Alas. Abbey Chatters, DO, Lambert Mody, Dereck Leep, Technician Referring MD:              Medicines:                See the Anesthesia note for documentation of the                            administered medications Complications:            No immediate complications. Estimated Blood Loss:     Estimated blood loss was minimal. Procedure:                Pre-Anesthesia Assessment:                           - The anesthesia plan was to use monitored                            anesthesia care (MAC).                           After obtaining informed consent, the endoscope was                            passed under direct vision. Throughout the                            procedure, the patient's blood pressure, pulse, and                            oxygen saturations were monitored continuously. The                            [id number]-H190 ([id number]) scope was introduced through the                            mouth, and advanced to the second part of duodenum.                            The upper GI endoscopy was accomplished without                            difficulty. The patient tolerated the procedure                            well. Scope In: 1:09:33  PM Scope Out: 1:14:42 PM Total Procedure Duration: 0 hours 5 minutes 9 seconds  Findings:      There is no endoscopic evidence of bleeding, areas of erosion,       esophagitis, ulcerations or varices in the entire esophagus.      Diffuse mild inflammation characterized by erythema was found in the       entire examined stomach. Biopsies were taken with a cold forceps  for       Helicobacter pylori testing.      The duodenal bulb, first portion of the duodenum and second portion of       the duodenum were normal. Biopsies for histology were taken with a cold       forceps for evaluation of celiac disease. Impression:               - Gastritis. Biopsied.                           - Normal duodenal bulb, first portion of the                            duodenum and second portion of the duodenum.                            Biopsied. Moderate Sedation:      Per Anesthesia Care Recommendation:           - Patient has a contact number available for                            emergencies. The signs and symptoms of potential                            delayed complications were discussed with the                            patient. Return to normal activities tomorrow.                            Written discharge instructions were provided to the                            patient.                           - Resume previous diet.                           - Continue present medications.                           - Await pathology results.                           - Use a proton pump inhibitor PO BID. Continue                            Zofran as needed.                           -  Return to GI clinic in 3 months. Procedure Code(s):        --- Professional ---                           [phone], Esophagogastroduodenoscopy, flexible,                            transoral; with biopsy, single or multiple Diagnosis Code(s):        --- Professional ---                           K29.70, Gastritis, unspecified, without bleeding                           R12, Heartburn                           K31.84, Gastroparesis                           R11.0, Nausea CPT copyright 2019 American Medical Association. All rights reserved. The codes documented in this report are preliminary and upon coder review may  be revised to meet current compliance requirements. Elon Alas.  Abbey Chatters, DO [place] Abbey Chatters, DO 04/25/2021 1:17:12 PM This report has been signed electronically. Number of Addenda: 0

## 2021-04-25 NOTE — Discharge Instructions (Addendum)
Monitored Anesthesia Care, Care After This sheet gives you information about how to care for yourself after your procedure. Your health care provider may also give you more specific instructions. If you have problems or questions, contact your health care provider. What can I expect after the procedure? After the procedure, it is common to have:  Tiredness.  Forgetfulness about what happened after the procedure.  Impaired judgment for important decisions.  Nausea or vomiting.  Some difficulty with balance. Follow these instructions at home: For the time period you were told by your health care provider:  Rest as needed.  Do not participate in activities where you could fall or become injured.  Do not drive or use machinery.  Do not drink alcohol.  Do not take sleeping pills or medicines that cause drowsiness.  Do not make important decisions or sign legal documents.  Do not take care of children on your own.      Eating and drinking  Follow the diet that is recommended by your health care provider.  Drink enough fluid to keep your urine pale yellow.  If you vomit: ? Drink water, juice, or soup when you can drink without vomiting. ? Make sure you have little or no nausea before eating solid foods. General instructions  Have a responsible adult stay with you for the time you are told. It is important to have someone help care for you until you are awake and alert.  Take over-the-counter and prescription medicines only as told by your health care provider.  If you have sleep apnea, surgery and certain medicines can increase your risk for breathing problems. Follow instructions from your health care provider about wearing your sleep device: ? Anytime you are sleeping, including during daytime naps. ? While taking prescription pain medicines, sleeping medicines, or medicines that make you drowsy.  Avoid smoking.  Keep all follow-up visits as told by your health  care provider. This is important. Contact a health care provider if:  You keep feeling nauseous or you keep vomiting.  You feel light-headed.  You are still sleepy or having trouble with balance after 24 hours.  You develop a rash.  You have a fever.  You have redness or swelling around the IV site. Get help right away if:  You have trouble breathing.  You have new-onset confusion at home. Summary  For several hours after your procedure, you may feel tired. You may also be forgetful and have poor judgment.  Have a responsible adult stay with you for the time you are told. It is important to have someone help care for you until you are awake and alert.  Rest as told. Do not drive or operate machinery. Do not drink alcohol or take sleeping pills.  Get help right away if you have trouble breathing, or if you suddenly become confused. This information is not intended to replace advice given to you by your health care provider. Make sure you discuss any questions you have with your health care provider. Document Revised: 07/21/2020 Document Reviewed: 10/08/2019 Elsevier Patient Education  2021 [place]. EGD Discharge instructions Please read the instructions outlined below and refer to this sheet in the next few weeks. These discharge instructions provide you with general information on caring for yourself after you leave the hospital. Your doctor may also give you specific instructions. While your treatment has been planned according to the most current medical practices available, unavoidable complications occasionally occur. If you have any problems or questions  after discharge, please call your doctor. ACTIVITY  You may resume your regular activity but move at a slower pace for the next 24 hours.   Take frequent rest periods for the next 24 hours.   Walking will help expel (get rid of) the air and reduce the bloated feeling in your abdomen.   No driving for 24 hours  (because of the anesthesia (medicine) used during the test).   You may shower.   Do not sign any important legal documents or operate any machinery for 24 hours (because of the anesthesia used during the test).  NUTRITION  Drink plenty of fluids.   You may resume your normal diet.   Begin with a light meal and progress to your normal diet.   Avoid alcoholic beverages for 24 hours or as instructed by your caregiver.  MEDICATIONS  You may resume your normal medications unless your caregiver tells you otherwise.  WHAT YOU CAN EXPECT TODAY  You may experience abdominal discomfort such as a feeling of fullness or "gas" pains.  FOLLOW-UP  Your doctor will discuss the results of your test with you.  SEEK IMMEDIATE MEDICAL ATTENTION IF ANY OF THE FOLLOWING OCCUR:  Excessive nausea (feeling sick to your stomach) and/or vomiting.   Severe abdominal pain and distention (swelling).   Trouble swallowing.   Temperature over 101 F (37.8 C).   Rectal bleeding or vomiting of blood.   Your EGD revealed very mild amount inflammation throughout your stomach.  I took biopsies of this to rule infection bacteria called H. pylori.  Also took biopsies of your small bowel as well.  Does appear that the twice daily dosing of pantoprazole is helping, will continue on this as well as Zofran.  No obvious ulceration noted.  Follow-up with GI in 3 months.   I hope you have a great rest of your week!  Elon Alas. Abbey Chatters, D.O. Gastroenterology and Hepatology [facility]

## 2021-04-25 NOTE — Anesthesia Postprocedure Evaluation (Signed)
Anesthesia Post Note  Patient: Jamie Harding  Procedure(s) Performed: ESOPHAGOGASTRODUODENOSCOPY (EGD) WITH PROPOFOL (N/A ) BIOPSY  Patient location during evaluation: Phase II Anesthesia Type: General Level of consciousness: awake and alert and oriented Pain management: pain level controlled Vital Signs Assessment: post-procedure vital signs reviewed and stable Respiratory status: spontaneous breathing and respiratory function stable Cardiovascular status: blood pressure returned to baseline and stable Postop Assessment: no apparent nausea or vomiting Anesthetic complications: no   No complications documented.   Last Vitals:  Vitals:   04/25/21 1231 04/25/21 1318  BP: 133/68 104/63  Pulse: 90 87  Resp: 18 16  Temp: 36.9 C 36.9 C  SpO2: 96% 97%    Last Pain:  Vitals:   04/25/21 1318  TempSrc: Oral  PainSc: 0-No pain                 Juanjesus Pepperman C Carlton Buskey

## 2021-04-25 NOTE — Transfer of Care (Signed)
Immediate Anesthesia Transfer of Care Note  Patient: Jamie Harding  Procedure(s) Performed: ESOPHAGOGASTRODUODENOSCOPY (EGD) WITH PROPOFOL (N/A ) BIOPSY  Patient Location: Short Stay  Anesthesia Type:MAC  Level of Consciousness: awake, alert  and oriented  Airway & Oxygen Therapy: Patient Spontanous Breathing  Post-op Assessment: Report given to RN and Post -op Vital signs reviewed and stable  Post vital signs: Reviewed and stable  Last Vitals:  Vitals Value Taken Time  BP    Temp    Pulse    Resp    SpO2      Last Pain:  Vitals:   04/25/21 1305  TempSrc:   PainSc: 0-No pain      Patients Stated Pain Goal: 6 (64/38/38 1840)  Complications: No complications documented.

## 2021-04-25 NOTE — Interval H&P Note (Signed)
History and Physical Interval Note:  05/23/9162 8:46 PM  Jamie Harding  has presented today for surgery, with the diagnosis of GERD, nausea/vomiting, abdominal pain.  The various methods of treatment have been discussed with the patient and family. After consideration of risks, benefits and other options for treatment, the patient has consented to  Procedure(s) with comments: ESOPHAGOGASTRODUODENOSCOPY (EGD) WITH PROPOFOL (N/A) - 1:15pm as a surgical intervention.  The patient's history has been reviewed, patient examined, no change in status, stable for surgery.  I have reviewed the patient's chart and labs.  Questions were answered to the patient's satisfaction.     Eloise Harman

## 2021-04-25 NOTE — Anesthesia Preprocedure Evaluation (Signed)
Anesthesia Evaluation  Patient identified by MRN, date of birth, ID band Patient awake    Reviewed: Allergy & Precautions, NPO status , Patient's Chart, lab work & pertinent test results  History of Anesthesia Complications Negative for: history of anesthetic complications  Airway Mallampati: II  TM Distance: >3 FB Neck ROM: Full    Dental  (+) Dental Advisory Given, Missing, Chipped   Pulmonary shortness of breath and with exertion, asthma , former smoker,    Pulmonary exam normal breath sounds clear to auscultation       Cardiovascular hypertension, Pt. on medications Normal cardiovascular exam Rhythm:Regular Rate:Normal     Neuro/Psych PSYCHIATRIC DISORDERS Anxiety Depression  Neuromuscular disease CVA    GI/Hepatic hiatal hernia, GERD (Gastroparesis)  Medicated,  Endo/Other  diabetes, Well Controlled, Type 2, Oral Hypoglycemic Agents, Insulin Dependent  Renal/GU Renal InsufficiencyRenal disease     Musculoskeletal  (+) Arthritis , Rheumatoid disorders,    Abdominal   Peds  Hematology negative hematology ROS (+)   Anesthesia Other Findings   Reproductive/Obstetrics                             Anesthesia Physical Anesthesia Plan  ASA: III  Anesthesia Plan: General   Post-op Pain Management:    Induction: Intravenous  PONV Risk Score and Plan: Propofol infusion  Airway Management Planned: Nasal Cannula and Natural Airway  Additional Equipment:   Intra-op Plan:   Post-operative Plan:   Informed Consent: I have reviewed the patients History and Physical, chart, labs and discussed the procedure including the risks, benefits and alternatives for the proposed anesthesia with the patient or authorized representative who has indicated his/her understanding and acceptance.     Dental advisory given  Plan Discussed with: CRNA and Surgeon  Anesthesia Plan Comments:          Anesthesia Quick Evaluation

## 2021-04-27 LAB — SURGICAL PATHOLOGY

## 2021-05-01 NOTE — Progress Notes (Addendum)
Subjective:   Jamie Harding is a 65 y.o. female who presents for Medicare Annual (Subsequent) preventive examination. I have reviewed and agree with the above annual wellness visit documentation [name] MD [facility]  I connected with Jamie Harding  today by telephone and verified that I am speaking with the correct person using two identifiers. Location patient: home Location provider: work Persons participating in the virtual visit: patient, provider.   I discussed the limitations, risks, security and privacy concerns of performing an evaluation and management service by telephone and the availability of in person appointments. I also discussed with the patient that there may be a patient responsible charge related to this service. The patient expressed understanding and verbally consented to this telephonic visit.    Interactive audio and video telecommunications were attempted between this provider and patient, however failed, due to patient having technical difficulties OR patient did not have access to video capability.  We continued and completed visit with audio only.     Review of Systems    N/A Cardiac Risk Factors include: advanced age (>71mn, >>42women);dyslipidemia;diabetes mellitus;hypertension     Objective:    Today's Vitals   05/02/21 1419  PainSc: 7    There is no height or weight on file to calculate BMI.  Advanced Directives 05/02/2021 04/25/2021 04/24/2021 03/17/2020 03/17/2020 08/05/2019 07/30/2019  Does Patient Have a Medical Advance Directive? No No No No No No No  Would patient like information on creating a medical advance directive? No - Patient declined No - Patient declined No - Patient declined No - Patient declined - No - Patient declined -  Pre-existing out of facility DNR order (yellow form or pink MOST form) - - - - - - -    Current Medications (verified) Outpatient Encounter Medications as of 05/02/2021  Medication Sig    albuterol (VENTOLIN HFA) 108 (90 Base) MCG/ACT inhaler INHALE 2 PUFFS INTO THE LUNGS EVERY 4 (FOUR) HOURS AS NEEDED FOR WHEEZING.   amLODipine (NORVASC) 5 MG tablet Take 1 tablet (5 mg total) by mouth daily.   blood glucose meter kit and supplies Dispense based on patient and insurance preference. Pt checking sugars four times a day . (FOR ICD-10 E10.9, E11.9).   busPIRone (BUSPAR) 10 MG tablet Take 1 tablet BID PRN   cetirizine (ZYRTEC) 10 MG tablet TAKE ONE TABLET (10MG TOTAL) BY MOUTH DAILY   clopidogrel (PLAVIX) 75 MG tablet TAKE ONE TABLET (75MG TOTAL) BY MOUTH DAILY WITH BREAKFAST   fluticasone (FLONASE) 50 MCG/ACT nasal spray Place 2 sprays into both nostrils daily.   [START ON 06/05/2021] HYDROcodone-acetaminophen (NORCO) 7.5-325 MG tablet Take one tablet po every 6 hrs prn moderate pain   [START ON 05/06/2021] HYDROcodone-acetaminophen (NORCO) 7.5-325 MG tablet Take 1 tablet by mouth every 6 (six) hours as needed for moderate pain.   HYDROcodone-acetaminophen (NORCO) 7.5-325 MG tablet Take 1 tablet by mouth every 6 (six) hours as needed for moderate pain.   insulin detemir (LEVEMIR) 100 UNIT/ML injection Inject 50 units in the skin in the morning and 50 units into skin qhs   lisinopril (ZESTRIL) 10 MG tablet Take 1 tablet (10 mg total) by mouth daily.   metFORMIN (GLUCOPHAGE) 1000 MG tablet TAKE ONE TABLET (1000 MG TOTAL) BY MOUTHTWO TIMES A DAY.   Multiple Vitamin (MULTIVITAMIN WITH MINERALS) TABS Take 1 tablet by mouth daily.   mupirocin ointment (BACTROBAN) 2 % Apply 1 application topically 2 (two) times daily.   NOVOLOG 100 UNIT/ML  injection Inject 10 units into skin with breakfast and 10 units into skin at supper   ondansetron (ZOFRAN) 8 MG tablet TAKE ONE TABLET BY MOUTH EVERY 12 HOURS AS NEEDED FOR NAUSEA   ONETOUCH VERIO test strip USE AS DIRECTED TO TEST BLOOD GLUCOSE 4 TIMES DAILY.   pantoprazole (PROTONIX) 40 MG tablet Take 1 tablet (40 mg total) by mouth 2 (two) times daily  before a meal.   promethazine (PHENERGAN) 25 MG tablet TAKE ONE TABLET BY MOUTH EVERY EIGHT HOURS AS NEEDED FOR NAUSEA   rosuvastatin (CRESTOR) 20 MG tablet Take 1 tablet (20 mg total) by mouth daily.   triamcinolone cream (KENALOG) 0.1 % Apply BID prn   trimethoprim (TRIMPEX) 100 MG tablet Take 1 tablet (100 mg total) by mouth daily.   venlafaxine (EFFEXOR) 75 MG tablet TAKE ONE (1) TABLET BY MOUTH 3 TIMES DAILY   No facility-administered encounter medications on file as of 05/02/2021.    Allergies (verified) Byetta 10 mcg pen [exenatide], Naproxen, Pollen extract, Augmentin [amoxicillin-pot clavulanate], Azithromycin, Erythromycin, Invokana [canagliflozin], Morphine, and Sulfonamide derivatives   History: Past Medical History:  Diagnosis Date   Anxiety    Asthma    Depression    Essential hypertension    Fatty liver    Gastritis    Gastroparesis    GERD (gastroesophageal reflux disease)    Hiatal hernia    History of cardiac catheterization    Minimal coronary atherosclerosis February 2016   History of kidney stones    History of stroke 2008   HLD (hyperlipidemia)    Neuropathy    Obesity    Pancreatitis    Rheumatoid arthritis(714.0)    Stroke ([place]) 2008   Stroke ([place]) 01/2020   Tibialis tendinitis    Type 2 diabetes mellitus ([place])    Past Surgical History:  Procedure Laterality Date   ANKLE SURGERY     remove extra bone-left   CARPAL TUNNEL RELEASE     right side   CARPAL TUNNEL RELEASE Left 02/06/2013   Procedure: CARPAL TUNNEL RELEASE ;  Surgeon: Carole Civil, MD;  Location: [facility];  Service: Orthopedics;  Laterality: Left;  procedure end [place] Right 09/14/2015   Procedure: RIGHT CARPAL TUNNEL RELEASE;  Surgeon: Carole Civil, MD;  Location: [facility];  Service: Orthopedics;  Laterality: Right;   CARPAL TUNNEL RELEASE Left 09/28/2015   Procedure: LEFT CARPAL TUNNEL RELEASE;  Surgeon: Carole Civil, MD;  Location: [facility];   Service: Orthopedics;  Laterality: Left;  procedure 1   CESAREAN SECTION     CHOLECYSTECTOMY     COLONOSCOPY WITH PROPOFOL  09/16/2012   QIW:LNLG sessile polyps ranging between 3-30m in size were found in the sigmoid colon and rectum (hyperplastic)/Mild diverticulosis was noted throughout the entire examined colon/Small internal hemorrhoids. Repeat in 10 years.   DILATATION & CURETTAGE/HYSTEROSCOPY WITH MYOSURE N/A 08/05/2019   Procedure: DILATATION & CURETTAGE/HYSTEROSCOPY WITH MYOSURE;  Surgeon: DSloan Leiter MD;  Location: [facility]  Service: Gynecology;  Laterality: N/A;  myosure rep will be here.  Confirmed on 07/30/19 CS   DILATION AND CURETTAGE OF UTERUS     multiple   ESOPHAGOGASTRODUODENOSCOPY  07/29/2009   Mild gastritis, benign path   ESOPHAGOGASTRODUODENOSCOPY (EGD) WITH PROPOFOL  09/16/2012   SLF:Non-erosive gastritis (inflammation) was found; multiple bx/The duodenal mucosa showed no abnormalities in the ampulla and bulb and second portion of the duodenum/NAUSEA/VOMITING MOST LIKELY DUE TO GERD/GASTRITIS   FINGER SURGERY Left  left little finger-otif of finger   Gastric Emptying  07/27/2009   The amount of activity in the stomach at 120 minutes was 13% which is in the normal range   LASER ABLATION OF THE CERVIX     LEFT AND RIGHT HEART CATHETERIZATION WITH CORONARY ANGIOGRAM N/A 01/03/2015   Procedure: LEFT AND RIGHT HEART CATHETERIZATION WITH CORONARY ANGIOGRAM;  Surgeon: Blane Ohara, MD;  Location: [facility] C[facility];  Service: Cardiovascular;  Laterality: N/A;   LITHOTRIPSY     POLYPECTOMY  09/16/2012   Procedure: POLYPECTOMY;  Surgeon: Danie Binder, MD;  Location: [facility];  Service: Endoscopy;  Laterality: N/A;   SAVORY DILATION  09/16/2012   Procedure: SAVORY DILATION;  Surgeon: Danie Binder, MD;  Location: [facility];  Service: Endoscopy;  Laterality: N/A;  16 fr dilation   TRIGGER FINGER RELEASE Left 02/06/2013   Procedure: RELEASE TRIGGER FINGER LEFT LONG  FINGER/A-1 PULLEY;  Surgeon: Carole Civil, MD;  Location: [facility];  Service: Orthopedics;  Laterality: Left;  procedure began [place] Right 09/14/2015   Procedure: RIGHT LONG FINGER TRIGGER RELEASE;  Surgeon: Carole Civil, MD;  Location: [facility];  Service: Orthopedics;  Laterality: Right;   TRIGGER FINGER RELEASE Left 09/28/2015   Procedure: LEFT RING TRIGGER FINGER RELEASE;  Surgeon: Carole Civil, MD;  Location: [facility];  Service: Orthopedics;  Laterality: Left;  left ring finger   TUBAL LIGATION     Family History  Problem Relation Age of Onset   Breast cancer Mother    Diabetes Father    Coronary artery disease Other    Arthritis Other    Asthma Other    Diabetes Sister    Colon cancer Neg Hx    Social History   Socioeconomic History   Marital status: Married    Spouse name: Not on file   Number of children: Not on file   Years of education: Not on file   Highest education level: Not on file  Occupational History   Occupation: nurse tech    Comment: [employer], 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)    Employer: [employer]  Tobacco Use   Smoking status: Former    Packs/day: 0.50    Years: 3.00    Pack years: 1.50    Types: Cigarettes    Quit date: 09/11/1987    Years since quitting: 33.6   Smokeless tobacco: Never  Substance and Sexual Activity   Alcohol use: No   Drug use: No   Sexual activity: Not Currently    Birth control/protection: Surgical  Other Topics Concern   Not on file  Social History Narrative   Not on file   Social Determinants of Health   Financial Resource Strain: Low Risk    Difficulty of Paying Living Expenses: Not hard at all  Food Insecurity: No Food Insecurity   Worried About Charity fundraiser in the Last Year: Never true   [place] in the Last Year: Never true  Transportation Needs: No Transportation Needs   Lack of Transportation (Medical): No   Lack of Transportation (Non-Medical): No   Physical Activity: Insufficiently Active   Days of Exercise per Week: 2 days   Minutes of Exercise per Session: 20 min  Stress: No Stress Concern Present   Feeling of Stress : Not at all  Social Connections: Moderately Isolated   Frequency of Communication with Friends and Family: Twice a week   Frequency of  Social Gatherings with Friends and Family: More than three times a week   Attends Religious Services: Never   Marine scientist or Organizations: No   Attends Music therapist: Never   Marital Status: Married    Tobacco Counseling Counseling given: Not Answered   Clinical Intake:  Pre-visit preparation completed: Yes  Pain : 0-10 Pain Score: 7  Pain Type: Neuropathic pain, Chronic pain Pain Location: Back Pain Descriptors / Indicators: Aching, Burning, Stabbing Pain Onset: More than a month ago Pain Frequency: Constant Pain Relieving Factors: Tylenol  Pain Relieving Factors: Tylenol  Nutritional Risks: Nausea/ vomitting/ diarrhea (nausea) Diabetes: Yes CBG done?: No Did pt. bring in CBG monitor from home?: No  How often do you need to have someone help you when you read instructions, pamphlets, or other written materials from your doctor or pharmacy?: 1 - Never  Diabetic?Yes Nutrition Risk Assessment:  Has the patient had any N/V/D within the last 2 months?  No  Does the patient have any non-healing wounds?  Yes  Has the patient had any unintentional weight loss or weight gain?  No   Diabetes:  Is the patient diabetic?  Yes  If diabetic, was a CBG obtained today?  No  Did the patient bring in their glucometer from home?  No  How often do you monitor your CBG's? State checks glucose twice a day.   Financial Strains and Diabetes Management:  Are you having any financial strains with the device, your supplies or your medication? No .  Does the patient want to be seen by Chronic Care Management for management of their diabetes?  No  Would  the patient like to be referred to a Nutritionist or for Diabetic Management?  No   Diabetic Exams:  Diabetic Eye Exam: Overdue for diabetic eye exam. Pt has been advised about the importance in completing this exam. Patient advised to call and schedule an eye exam. Diabetic Foot Exam: Completed 05/02/2020   Interpreter Needed?: No  Information entered by :: [place] of Daily Living In your present state of health, do you have any difficulty performing the following activities: 05/02/2021 04/24/2021  Hearing? Y N  Vision? Y N  Difficulty concentrating or making decisions? Y N  Comment sometimes has issues remembering but it usually comes back -  Walking or climbing stairs? Y -  Comment has difficulties coming downstairs. Patient feels like she going to fall -  Dressing or bathing? N N  Doing errands, shopping? N N  Preparing Food and eating ? N -  Using the Toilet? N -  In the past six months, have you accidently leaked urine? Y -  Comment currently wears and incontience pad -  Do you have problems with loss of bowel control? Y -  Comment has some occassional bowel leakage on occassion -  Managing your Medications? N -  Managing your Finances? N -  Housekeeping or managing your Housekeeping? N -  Some recent data might be hidden    Patient Care Team: Kathyrn Drown, MD as PCP - General (Family Medicine) Josue Hector, MD as PCP - Cardiology (Cardiology) Eloise Harman, DO as Consulting Physician (Gastroenterology)  Indicate any recent Medical Services you may have received from other than [facility] providers in the past year (date may be approximate).     Assessment:   This is a routine wellness examination for Jamie Harding.  Hearing/Vision screen Vision Screening - Comments:: Patient states gets eyes examined  every year. Currently wears glasses  Dietary issues and exercise activities discussed: Current Exercise Habits: Home exercise routine, Type of exercise:  walking, Time (Minutes): 20, Frequency (Times/Week): 2, Weekly Exercise (Minutes/Week): 40, Intensity: Mild, Exercise limited by: orthopedic condition(s)   Goals Addressed             This Visit's Progress    DIET - REDUCE SUGAR INTAKE       HEMOGLOBIN A1C < 7       Weight (lb) < 180 lb (81.6 kg)         Depression Screen PHQ 2/9 Scores 05/02/2021 04/06/2021 12/30/2020 06/08/2019 12/30/2018 09/22/2018 06/23/2018  PHQ - 2 Score 4 0 0 4 4 5 6   PHQ- 9 Score 13 - - 13 14 13 13     Fall Risk Fall Risk  05/02/2021 04/06/2021 03/16/2020 12/30/2018 09/20/2015  Falls in the past year? 0 0 0 0 No  Number falls in past yr: 0 0 - 0 -  Injury with Fall? 0 0 - 0 -  Risk for fall due to : No Fall Risks - - - -  Follow up Falls evaluation completed;Falls prevention discussed Falls evaluation completed - Falls evaluation completed -    FALL RISK PREVENTION PERTAINING TO THE HOME:  Any stairs in or around the home? Yes  If so, are there any without handrails? No  Home free of loose throw rugs in walkways, pet beds, electrical cords, etc? Yes  Adequate lighting in your home to reduce risk of falls? Yes   ASSISTIVE DEVICES UTILIZED TO PREVENT FALLS:  Life alert? No  Use of a cane, walker or w/c? No  Grab bars in the bathroom? No  Shower chair or bench in shower? No  Elevated toilet seat or a handicapped toilet? No    Cognitive Function:  Normal cognitive status assessed by direct observation by this Nurse Health Advisor. No abnormalities found.        Immunizations Immunization History  Administered Date(s) Administered   Influenza,inj,Quad PF,6+ Mos 10/29/2018, 09/07/2019, 09/26/2020   Influenza-Unspecified 07/20/2014, 08/20/2015   Moderna Sars-Covid-2 Vaccination 07/14/2020, 08/11/2020   Pneumococcal-Unspecified 08/26/2007   Td 03/31/2007   Zoster Recombinat (Shingrix) 09/09/2019    TDAP status: Due, Education has been provided regarding the importance of this vaccine. Advised may  receive this vaccine at local pharmacy or Health Dept. Aware to provide a copy of the vaccination record if obtained from local pharmacy or Health Dept. Verbalized acceptance and understanding.  Flu Vaccine status: Up to date  Pneumococcal vaccine status: Up to date  Covid-19 vaccine status: Completed vaccines  Qualifies for Shingles Vaccine? Yes   Zostavax completed No   Shingrix Completed?: Yes  Screening Tests Health Maintenance  Topic Date Due   Pneumococcal Vaccine 41-48 Years old (1 - PCV) Never done   OPHTHALMOLOGY EXAM  12/25/2015   TETANUS/TDAP  03/30/2017   Zoster Vaccines- Shingrix (2 of 2) 11/04/2019   COVID-19 Vaccine (3 - Moderna risk series) 09/08/2020   MAMMOGRAM  01/13/2021   FOOT EXAM  05/02/2021   HEMOGLOBIN A1C  10/13/2021   PAP SMEAR-Modifier  12/30/2021   COLONOSCOPY (Pts 45-60yr Insurance coverage will need to be confirmed)  09/16/2022   PNEUMOCOCCAL POLYSACCHARIDE VACCINE AGE 41-64 HIGH RISK  Completed   Hepatitis C Screening  Completed   HIV Screening  Completed   HPV VACCINES  Aged Out   INFLUENZA VACCINE  Discontinued    Health Maintenance  Health Maintenance Due  Topic Date Due  Pneumococcal Vaccine 17-41 Years old (1 - PCV) Never done   OPHTHALMOLOGY EXAM  12/25/2015   TETANUS/TDAP  03/30/2017   Zoster Vaccines- Shingrix (2 of 2) 11/04/2019   COVID-19 Vaccine (3 - Moderna risk series) 09/08/2020   MAMMOGRAM  01/13/2021   FOOT EXAM  05/02/2021    Colorectal cancer screening: Type of screening: Colonoscopy. Completed 09/16/2012. Repeat every 10 years  Mammogram status: Ordered 05/02/2021. Pt provided with contact info and advised to call to schedule appt.   Bone Density Status: Not due until age 72  Lung Cancer Screening: (Low Dose CT Chest recommended if Age 42-80 years, 30 pack-year currently smoking OR have quit w/in 15years.) does not qualify.   Lung Cancer Screening Referral: N/A   Additional Screening:  Hepatitis C Screening:  does qualify; Completed 09/03/2019  Vision Screening: Recommended annual ophthalmology exams for early detection of glaucoma and other disorders of the eye. Is the patient up to date with their annual eye exam?  Yes  Who is the provider or what is the name of the office in which the patient attends annual eye exams? Dr. Jorja Loa If pt is not established with a provider, would they like to be referred to a provider to establish care? No .   Dental Screening: Recommended annual dental exams for proper oral hygiene  Community Resource Referral / Chronic Care Management: CRR required this visit?  No   CCM required this visit?  No      Plan:     I have personally reviewed and noted the following in the patient's chart:   Medical and social history Use of alcohol, tobacco or illicit drugs  Current medications and supplements including opioid prescriptions.  Functional ability and status Nutritional status Physical activity Advanced directives List of other physicians Hospitalizations, surgeries, and ER visits in previous 12 months Vitals Screenings to include cognitive, depression, and falls Referrals and appointments  In addition, I have reviewed and discussed with patient certain preventive protocols, quality metrics, and best practice recommendations. A written personalized care plan for preventive services as well as general preventive health recommendations were provided to patient.     Ofilia Neas, LPN   05/15/349   Nurse Notes: None  I have reviewed and agree with the above annual wellness visit documentation [name] MD [facility]

## 2021-05-02 ENCOUNTER — Ambulatory Visit (INDEPENDENT_AMBULATORY_CARE_PROVIDER_SITE_OTHER): Payer: PPO

## 2021-05-02 ENCOUNTER — Other Ambulatory Visit: Payer: Self-pay

## 2021-05-02 ENCOUNTER — Telehealth: Payer: Self-pay | Admitting: Family Medicine

## 2021-05-02 DIAGNOSIS — Z0000 Encounter for general adult medical examination without abnormal findings: Secondary | ICD-10-CM

## 2021-05-02 NOTE — Patient Instructions (Signed)
Jamie Harding , Thank you for taking time to come for your Medicare Wellness Visit. I appreciate your ongoing commitment to your health goals. Please review the following plan we discussed and let me know if I can assist you in the future.   Screening recommendations/referrals: Colonoscopy: Up to date next due 09/16/2022 Mammogram: Currently due, orders placed please call and get scheduled Bone Density: Not due until age 63 Recommended yearly ophthalmology/optometry visit for glaucoma screening and checkup Recommended yearly dental visit for hygiene and checkup  Vaccinations: Influenza vaccine: Up to date, next due fall 2022  Pneumococcal vaccine: Not due until age 48 Tdap vaccine:  Currently due you may await and injury Shingles vaccine: Completed series     Advanced directives: Advance directive discussed with you today. Even though you declined this today please call our office should you change your mind and we can give you the proper paperwork for you to fill out.   Conditions/risks identified: None   Next appointment: None   Preventive Care 40-64 Years, Female Preventive care refers to lifestyle choices and visits with your health care provider that can promote health and wellness. What does preventive care include? A yearly physical exam. This is also called an annual well check. Dental exams once or twice a year. Routine eye exams. Ask your health care provider how often you should have your eyes checked. Personal lifestyle choices, including: Daily care of your teeth and gums. Regular physical activity. Eating a healthy diet. Avoiding tobacco and drug use. Limiting alcohol use. Practicing safe sex. Taking low-dose aspirin daily starting at age 8. Taking vitamin and mineral supplements as recommended by your health care provider. What happens during an annual well check? The services and screenings done by your health care provider during your annual well check will  depend on your age, overall health, lifestyle risk factors, and family history of disease. Counseling  Your health care provider may ask you questions about your: Alcohol use. Tobacco use. Drug use. Emotional well-being. Home and relationship well-being. Sexual activity. Eating habits. Work and work Statistician. Method of birth control. Menstrual cycle. Pregnancy history. Screening  You may have the following tests or measurements: Height, weight, and BMI. Blood pressure. Lipid and cholesterol levels. These may be checked every 5 years, or more frequently if you are over 67 years old. Skin check. Lung cancer screening. You may have this screening every year starting at age 28 if you have a 30-pack-year history of smoking and currently smoke or have quit within the past 15 years. Fecal occult blood test (FOBT) of the stool. You may have this test every year starting at age 64. Flexible sigmoidoscopy or colonoscopy. You may have a sigmoidoscopy every 5 years or a colonoscopy every 10 years starting at age 65. Hepatitis C blood test. Hepatitis B blood test. Sexually transmitted disease (STD) testing. Diabetes screening. This is done by checking your blood sugar (glucose) after you have not eaten for a while (fasting). You may have this done every 1-3 years. Mammogram. This may be done every 1-2 years. Talk to your health care provider about when you should start having regular mammograms. This may depend on whether you have a family history of breast cancer. BRCA-related cancer screening. This may be done if you have a family history of breast, ovarian, tubal, or peritoneal cancers. Pelvic exam and Pap test. This may be done every 3 years starting at age 49. Starting at age 74, this may be done every 5 years  if you have a Pap test in combination with an HPV test. Bone density scan. This is done to screen for osteoporosis. You may have this scan if you are at high risk for  osteoporosis. Discuss your test results, treatment options, and if necessary, the need for more tests with your health care provider. Vaccines  Your health care provider may recommend certain vaccines, such as: Influenza vaccine. This is recommended every year. Tetanus, diphtheria, and acellular pertussis (Tdap, Td) vaccine. You may need a Td booster every 10 years. Zoster vaccine. You may need this after age 65. Pneumococcal 13-valent conjugate (PCV13) vaccine. You may need this if you have certain conditions and were not previously vaccinated. Pneumococcal polysaccharide (PPSV23) vaccine. You may need one or two doses if you smoke cigarettes or if you have certain conditions. Talk to your health care provider about which screenings and vaccines you need and how often you need them. This information is not intended to replace advice given to you by your health care provider. Make sure you discuss any questions you have with your health care provider. Document Released: 12/02/2015 Document Revised: 07/25/2016 Document Reviewed: 09/06/2015 Elsevier Interactive Patient Education  2017 [place] Prevention in the Home Falls can cause injuries. They can happen to people of all ages. There are many things you can do to make your home safe and to help prevent falls. What can I do on the outside of my home? Regularly fix the edges of walkways and driveways and fix any cracks. Remove anything that might make you trip as you walk through a door, such as a raised step or threshold. Trim any bushes or trees on the path to your home. Use bright outdoor lighting. Clear any walking paths of anything that might make someone trip, such as rocks or tools. Regularly check to see if handrails are loose or broken. Make sure that both sides of any steps have handrails. Any raised decks and porches should have guardrails on the edges. Have any leaves, snow, or ice cleared regularly. Use sand or  salt on walking paths during winter. Clean up any spills in your garage right away. This includes oil or grease spills. What can I do in the bathroom? Use night lights. Install grab bars by the toilet and in the tub and shower. Do not use towel bars as grab bars. Use non-skid mats or decals in the tub or shower. If you need to sit down in the shower, use a plastic, non-slip stool. Keep the floor dry. Clean up any water that spills on the floor as soon as it happens. Remove soap buildup in the tub or shower regularly. Attach bath mats securely with double-sided non-slip rug tape. Do not have throw rugs and other things on the floor that can make you trip. What can I do in the bedroom? Use night lights. Make sure that you have a light by your bed that is easy to reach. Do not use any sheets or blankets that are too big for your bed. They should not hang down onto the floor. Have a firm chair that has side arms. You can use this for support while you get dressed. Do not have throw rugs and other things on the floor that can make you trip. What can I do in the kitchen? Clean up any spills right away. Avoid walking on wet floors. Keep items that you use a lot in easy-to-reach places. If you need to reach something  above you, use a strong step stool that has a grab bar. Keep electrical cords out of the way. Do not use floor polish or wax that makes floors slippery. If you must use wax, use non-skid floor wax. Do not have throw rugs and other things on the floor that can make you trip. What can I do with my stairs? Do not leave any items on the stairs. Make sure that there are handrails on both sides of the stairs and use them. Fix handrails that are broken or loose. Make sure that handrails are as long as the stairways. Check any carpeting to make sure that it is firmly attached to the stairs. Fix any carpet that is loose or worn. Avoid having throw rugs at the top or bottom of the stairs. If  you do have throw rugs, attach them to the floor with carpet tape. Make sure that you have a light switch at the top of the stairs and the bottom of the stairs. If you do not have them, ask someone to add them for you. What else can I do to help prevent falls? Wear shoes that: Do not have high heels. Have rubber bottoms. Are comfortable and fit you well. Are closed at the toe. Do not wear sandals. If you use a stepladder: Make sure that it is fully opened. Do not climb a closed stepladder. Make sure that both sides of the stepladder are locked into place. Ask someone to hold it for you, if possible. Clearly mark and make sure that you can see: Any grab bars or handrails. First and last steps. Where the edge of each step is. Use tools that help you move around (mobility aids) if they are needed. These include: Canes. Walkers. Scooters. Crutches. Turn on the lights when you go into a dark area. Replace any light bulbs as soon as they burn out. Set up your furniture so you have a clear path. Avoid moving your furniture around. If any of your floors are uneven, fix them. If there are any pets around you, be aware of where they are. Review your medicines with your doctor. Some medicines can make you feel dizzy. This can increase your chance of falling. Ask your doctor what other things that you can do to help prevent falls. This information is not intended to replace advice given to you by your health care provider. Make sure you discuss any questions you have with your health care provider. Document Released: 09/01/2009 Document Revised: 04/12/2016 Document Reviewed: 12/10/2014 Elsevier Interactive Patient Education  2017 [employer].

## 2021-05-02 NOTE — Telephone Encounter (Signed)
During the medicare wellness visit patient asked if she could have something called in for her neuropathy. She states that she is having the constant burning and stinging in her feet. Please advise?

## 2021-05-03 ENCOUNTER — Encounter (HOSPITAL_COMMUNITY): Payer: Self-pay | Admitting: Internal Medicine

## 2021-05-03 NOTE — Telephone Encounter (Signed)
Nurses-this tingling in the feet is more than likely related to neuropathy.  Unfortunately there is not a simple answer that fits with a phone message.  There are too many potential ups and downs/side effects to the various options to be able to just prescribe something via phone Either we can discuss this further with the patient does her follow-up visit in August or patient can do appointment sooner(not emergent) her choice

## 2021-05-04 NOTE — Telephone Encounter (Signed)
Patient advised per Dr Nicki Reaper: this tingling in the feet is more than likely related to neuropathy.  Unfortunately there is not a simple answer that fits with a phone message.  There are too many potential ups and downs/side effects to the various options to be able to just prescribe something via phone Either we can discuss this further with the patient does her follow-up visit in August or patient can do appointment sooner(not emergent) her choice  Patient verbalized understanding and stated it has been going on a long time and she will discuss it at her appointment in August.

## 2021-05-04 NOTE — Telephone Encounter (Signed)
Left message to return call 

## 2021-05-31 ENCOUNTER — Other Ambulatory Visit: Payer: Self-pay | Admitting: Family Medicine

## 2021-07-04 ENCOUNTER — Encounter: Payer: Self-pay | Admitting: Family Medicine

## 2021-07-05 ENCOUNTER — Other Ambulatory Visit: Payer: Self-pay | Admitting: Family Medicine

## 2021-07-05 DIAGNOSIS — G894 Chronic pain syndrome: Secondary | ICD-10-CM

## 2021-07-05 MED ORDER — HYDROCODONE-ACETAMINOPHEN 7.5-325 MG PO TABS: 1.0000 | ORAL_TABLET | Freq: Four times a day (QID) | ORAL | 0 refills | Status: DC | PRN

## 2021-07-07 ENCOUNTER — Other Ambulatory Visit: Payer: Self-pay | Admitting: Family Medicine

## 2021-07-07 ENCOUNTER — Other Ambulatory Visit: Payer: Self-pay

## 2021-07-07 ENCOUNTER — Ambulatory Visit (INDEPENDENT_AMBULATORY_CARE_PROVIDER_SITE_OTHER): Payer: PPO | Admitting: Family Medicine

## 2021-07-07 VITALS — BP 122/76 | HR 83 | Ht 62.0 in | Wt 205.2 lb

## 2021-07-07 DIAGNOSIS — Z79899 Other long term (current) drug therapy: Secondary | ICD-10-CM

## 2021-07-07 DIAGNOSIS — G894 Chronic pain syndrome: Secondary | ICD-10-CM

## 2021-07-07 DIAGNOSIS — E1169 Type 2 diabetes mellitus with other specified complication: Secondary | ICD-10-CM

## 2021-07-07 DIAGNOSIS — Z79891 Long term (current) use of opiate analgesic: Secondary | ICD-10-CM

## 2021-07-07 DIAGNOSIS — I10 Essential (primary) hypertension: Secondary | ICD-10-CM

## 2021-07-07 DIAGNOSIS — E1159 Type 2 diabetes mellitus with other circulatory complications: Secondary | ICD-10-CM

## 2021-07-07 DIAGNOSIS — E785 Hyperlipidemia, unspecified: Secondary | ICD-10-CM

## 2021-07-07 MED ORDER — HYDROCODONE-ACETAMINOPHEN 7.5-325 MG PO TABS: 1.0000 | ORAL_TABLET | Freq: Four times a day (QID) | ORAL | 0 refills | Status: DC | PRN

## 2021-07-07 MED ORDER — ROSUVASTATIN CALCIUM 20 MG PO TABS: 20.0000 mg | ORAL_TABLET | Freq: Every day | ORAL | 1 refills | Status: DC

## 2021-07-07 MED ORDER — VENLAFAXINE HCL 75 MG PO TABS: ORAL_TABLET | ORAL | 1 refills | Status: DC

## 2021-07-07 MED ORDER — BUSPIRONE HCL 10 MG PO TABS: ORAL_TABLET | ORAL | 5 refills | Status: DC

## 2021-07-07 MED ORDER — PROMETHAZINE HCL 25 MG PO TABS: ORAL_TABLET | ORAL | 5 refills | Status: DC

## 2021-07-07 MED ORDER — AMLODIPINE BESYLATE 5 MG PO TABS: 5.0000 mg | ORAL_TABLET | Freq: Every day | ORAL | 1 refills | Status: DC

## 2021-07-07 MED ORDER — INSULIN DETEMIR 100 UNIT/ML ~~LOC~~ SOLN: SUBCUTANEOUS | 5 refills | Status: DC

## 2021-07-07 NOTE — Progress Notes (Signed)
Subjective:    Patient ID: Jamie Harding, female    DOB: 07-Oct-1956, 65 y.o.   MRN: [id number]  HPI  This patient was seen today for chronic pain  The medication list was reviewed and updated.  Location of Pain for which the patient has been treated with regarding narcotics: Lumbar pain  Onset of this pain: Present for years   -Compliance with medication: Good compliance  - Number patient states they take daily: 3-4 daily  -when was the last dose patient took?  Earlier today  The patient was advised the importance of maintaining medication and not using illegal substances with these.  Here for refills and follow up  The patient was educated that we can provide 3 monthly scripts for their medication, it is their responsibility to follow the instructions.  Side effects or complications from medications: She denies side effects  Patient is aware that pain medications are meant to minimize the severity of the pain to allow their pain levels to improve to allow for better function. They are aware of that pain medications cannot totally remove their pain.  Due for UDT ( at least once per year) : This summer  Scale of 1 to 10 ( 1 is least 10 is most) Your pain level without the medicine: 8 Your pain level with medication 4  Scale 1 to 10 ( 1-helps very little, 10 helps very well) How well does your pain medication reduce your pain so you can function better through out the day? 7  The patient was seen today as part of a comprehensive diabetic check up. Patient has diabetes  Compliance-relates compliance but states her diet could be a little bit better Low sugars-she did have some low sugars last night because she accidentally took too much of the short acting Dietary effort-she states she is trying harder at eating healthier and trying to get her A1c down Foot exam and ophthalmology exam requirements were reviewed        Review of Systems     Objective:   Physical  Exam  General-in no acute distress Eyes-no discharge Lungs-respiratory rate normal, CTA CV-no murmurs,RRR Extremities skin warm dry no edema Neuro grossly normal Behavior normal, alert       Assessment & Plan:   1. Type 2 diabetes mellitus with vascular disease (Jamie Harding) Her diabetes not under good control A1c needs to be down near 7 we talked about doing better with diet as well as checking her sugars and monitoring with appropriate tests we will do lab work again by early November if she is not under better control at that point will raise up intensive care for her including clinical pharmacist help  2. Essential hypertension Blood pressure decent control continue current measures  3. Hyperlipidemia associated with type 2 diabetes mellitus (HCC) Previous cholesterol looks good recheck this again before next visit continue healthy diet  4. Chronic pain syndrome The patient was seen in followup for chronic pain. A review over at their current pain status was discussed. Drug registry was checked. Prescriptions were given.  Regular follow-up recommended. Discussion was held regarding the importance of compliance with medication as well as pain medication contract.  Patient was informed that medication may cause drowsiness and should not be combined  with other medications/alcohol or street drugs. If the patient feels medication is causing altered alertness then do not drive or operate dangerous equipment. Drug registry checked continue current measures Urine drug ordered   5. Long-term current use  of opiate analgesic See above

## 2021-07-08 NOTE — Telephone Encounter (Signed)
May have 6 months on all 

## 2021-07-12 LAB — TOXASSURE SELECT 13 (MW), URINE

## 2021-08-14 ENCOUNTER — Encounter: Payer: Self-pay | Admitting: Family Medicine

## 2021-08-14 NOTE — Telephone Encounter (Signed)
Nurses    Sorry that Jamie Harding is having sleep issues.  Unfortunately 40% of all adults have sleep issues.  There is no perfect medication for sleep.  The trazodone that she speaks of will not get along with her Effexor.  Behavioral approaches for sleep is what is recommended currently by experts.  We can discuss all of this further at a follow-up office visit.  In the meantime if she would like to try melatonin she can  Thanks-Dr. Nicki Reaper

## 2021-08-21 ENCOUNTER — Encounter: Payer: Self-pay | Admitting: Family Medicine

## 2021-08-21 MED ORDER — FLUCONAZOLE 150 MG PO TABS: ORAL_TABLET | ORAL | 0 refills | Status: DC

## 2021-08-21 NOTE — Addendum Note (Signed)
Addended by: Dairl Ponder on: 08/21/2021 03:44 PM   Modules accepted: Orders

## 2021-08-31 ENCOUNTER — Ambulatory Visit: Payer: PPO | Admitting: Urology

## 2021-09-07 ENCOUNTER — Other Ambulatory Visit: Payer: Self-pay | Admitting: Family Medicine

## 2021-09-12 NOTE — Progress Notes (Signed)
Referring Provider: Kathyrn Drown, MD Primary Care Physician:  Kathyrn Drown, MD Primary GI Physician: Dr. Abbey Chatters  Chief Complaint  Patient presents with   Emesis    occ    HPI:   Jamie Harding is a 65 y.o. female  with history of stroke, diabetes, rheumatoid arthritis, anxiety/depression, HTN, HLD, GERD, chronic nausea with vomiting, and gastroparesis (abnormal GES November 2021), presenting today for follow-up chronic nausea and vomiting, early satiety, epigastric abdominal pain, and constipation s/p EGD.  Last seen in our office 03/27/2021 reporting 10-year history of intermittent nausea/vomiting, early satiety, and intermittent epigastric abdominal pain using Zofran or Phenergan as needed and intermittently following clear liquid diet.  Associated unintentional weight loss of about 38 pounds over the last 5 years though weight seems stable x1 year.  GERD symptoms were controlled on Protonix daily.  Denied NSAIDs.  Overall, suspected symptoms were secondary to gastroparesis in the setting of diabetes, opioid pain medications likely worsening symptoms.  Recommended repeat EGD, increase PPI to twice daily, gastroparesis diet, antiemetic as needed, and could consider Reglan in the future if absolutely necessary.  She also reported chronic constipation that was not adequately managed with OTC stool softeners.  Reported single episode of toilet tissue hematochezia in setting of passing large, hard stool.  Plan to start MiraLAX daily.  Consider prescriptive medication if needed.  EGD 04/25/2021: Gastritis biopsied, normal examined duodenum biopsied.  Pathology revealed focal chronic gastritis, negative for H. Pylori, benign small bowel mucosa.  Today:   Gastroparesis: Not vomiting as frequently, maybe 1-2 times per month compared to about twice a week. Continues with nausea about twice a week. Was about every morning. Eating softer foods. Doesn't over eat. Limiting fried/fatty foods. Trying  to stick to lean meats. Avoiding raw vegetables. Eating 4-6 small meals per day and not eating within 3 hours of going to bed. Using Zofran or phenergan prn which is working fairly well.   No abdominal pain.   Constipation: Stools are hard and incomplete. Chronic. Prior to pain medications.  Started a young age.  No brbpr or melena.   Should be having blood work in the next week or so with Dr. Wolfgang Phoenix.   Past Medical History:  Diagnosis Date   Anxiety    Asthma    Depression    Essential hypertension    Fatty liver    Gastritis    Gastroparesis    GERD (gastroesophageal reflux disease)    Hiatal hernia    History of cardiac catheterization    Minimal coronary atherosclerosis February 2016   History of kidney stones    History of stroke 2008   HLD (hyperlipidemia)    Neuropathy    Obesity    Pancreatitis    Rheumatoid arthritis(714.0)    Stroke ([place]) 2008   Stroke ([place]) 01/2020   Tibialis tendinitis    Type 2 diabetes mellitus ([place])     Past Surgical History:  Procedure Laterality Date   ANKLE SURGERY     remove extra bone-left   BIOPSY  04/25/2021   Procedure: BIOPSY;  Surgeon: Eloise Harman, DO;  [facility];  Service: Endoscopy;;   CARPAL TUNNEL RELEASE     right side   CARPAL TUNNEL RELEASE Left 02/06/2013   Procedure: CARPAL TUNNEL RELEASE ;  Surgeon: Carole Civil, MD;  Location: [facility];  Service: Orthopedics;  Laterality: Left;  procedure end [place] Right 09/14/2015   Procedure:  RIGHT CARPAL TUNNEL RELEASE;  Surgeon: Carole Civil, MD;  [facility];  Service: Orthopedics;  Laterality: Right;   CARPAL TUNNEL RELEASE Left 09/28/2015   Procedure: LEFT CARPAL TUNNEL RELEASE;  Surgeon: Carole Civil, MD;  [facility];  Service: Orthopedics;  Laterality: Left;  procedure 1   CESAREAN SECTION     CHOLECYSTECTOMY     COLONOSCOPY WITH PROPOFOL  09/16/2012   UYQ:IHKV sessile polyps ranging between 3-31m in  size were found in the sigmoid colon and rectum (hyperplastic)/Mild diverticulosis was noted throughout the entire examined colon/Small internal hemorrhoids. Repeat in 10 years.   DILATATION & CURETTAGE/HYSTEROSCOPY WITH MYOSURE N/A 08/05/2019   Procedure: DILATATION & CURETTAGE/HYSTEROSCOPY WITH MYOSURE;  Surgeon: DSloan Leiter MD;  [facility]  Service: Gynecology;  Laterality: N/A;  myosure rep will be here.  Confirmed on 07/30/19 CS   DILATION AND CURETTAGE OF UTERUS     multiple   ESOPHAGOGASTRODUODENOSCOPY  07/29/2009   Mild gastritis, benign path   ESOPHAGOGASTRODUODENOSCOPY (EGD) WITH PROPOFOL  09/16/2012   SLF:Non-erosive gastritis (inflammation) was found; multiple bx/The duodenal mucosa showed no abnormalities in the ampulla and bulb and second portion of the duodenum/NAUSEA/VOMITING MOST LIKELY DUE TO GERD/GASTRITIS   ESOPHAGOGASTRODUODENOSCOPY (EGD) WITH PROPOFOL N/A 04/25/2021   Surgeon: CEloise Harman DO; Gastritis biopsied, normal examined duodenum biopsied.  Pathology revealed focal chronic gastritis, negative for H. Pylori, benign small bowel mucosa.   FINGER SURGERY Left    left little finger-otif of finger   Gastric Emptying  07/27/2009   The amount of activity in the stomach at 120 minutes was 13% which is in the normal range   LASER ABLATION OF THE CERVIX     LEFT AND RIGHT HEART CATHETERIZATION WITH CORONARY ANGIOGRAM N/A 01/03/2015   Procedure: LEFT AND RIGHT HEART CATHETERIZATION WITH CORONARY ANGIOGRAM;  Surgeon: MBlane Ohara MD;  [facility];  Service: Cardiovascular;  Laterality: N/A;   LITHOTRIPSY     POLYPECTOMY  09/16/2012   Procedure: POLYPECTOMY;  Surgeon: SDanie Binder MD;  [facility];  Service: Endoscopy;  Laterality: N/A;   SAVORY DILATION  09/16/2012   Procedure: SAVORY DILATION;  Surgeon: SDanie Binder MD;  [facility];  Service: Endoscopy;  Laterality: N/A;  16 fr dilation   TRIGGER FINGER  RELEASE Left 02/06/2013   Procedure: RELEASE TRIGGER FINGER LEFT LONG FINGER/A-1 PULLEY;  Surgeon: SCarole Civil MD;  [facility];  Service: Orthopedics;  Laterality: Left;  [place]   Procedure: RIGHT LONG FINGER TRIGGER RELEASE;  Surgeon: SCarole Civil MD;  [facility];  Service: Orthopedics;  Laterality: Right;   TRIGGER FINGER RELEASE Left 09/28/2015   Procedure: LEFT RING TRIGGER FINGER RELEASE;  Surgeon: SCarole Civil MD;  [facility];  Service: Orthopedics;  Laterality: Left;  left ring finger   TUBAL LIGATION      Current Outpatient Medications  Medication Sig Dispense Refill   albuterol (VENTOLIN HFA) 108 (90 Base) MCG/ACT inhaler INHALE 2 PUFFS INTO THE LUNGS EVERY 4 (FOUR) HOURS AS NEEDED FOR WHEEZING. 54 g 5   amLODipine (NORVASC) 5 MG tablet Take 1 tablet (5 mg total) by mouth daily. 90 tablet 1   blood glucose meter kit and supplies Dispense based on patient and insurance preference. Pt checking sugars four times a day . (FOR ICD-10 E10.9, E11.9). 1 each 0   busPIRone (BUSPAR) 10 MG tablet  Take 1 tablet BID PRN 60 tablet 5   cetirizine (ZYRTEC) 10 MG tablet TAKE ONE TABLET BY MOUTH ONCE DAILY. 90 tablet 0   clopidogrel (PLAVIX) 75 MG tablet TAKE ONE TABLET BY MOUTH DAILY WITH BREAKFAST 90 tablet 0   fluconazole (DIFLUCAN) 150 MG tablet One tablet 3 days apart 2 tablet 0   fluticasone (FLONASE) 50 MCG/ACT nasal spray Place 2 sprays into both nostrils daily. 48 g 3   HYDROcodone-acetaminophen (NORCO) 7.5-325 MG tablet Take one tablet po every 6 hrs prn moderate pain 120 tablet 0   HYDROcodone-acetaminophen (NORCO) 7.5-325 MG tablet Take 1 tablet by mouth every 6 (six) hours as needed for moderate pain. 120 tablet 0   HYDROcodone-acetaminophen (NORCO) 7.5-325 MG tablet Take 1 tablet by mouth every 6 (six) hours as needed for moderate pain. 120 tablet 0   insulin detemir (LEVEMIR) 100 UNIT/ML  injection Inject 72 units in the skin in the morning and 72 units into skin qhs- may titrate to 85 units BID 15 mL 5   lisinopril (ZESTRIL) 10 MG tablet TAKE 1 TABLET BY MOUTH DAILY. 90 tablet 0   metFORMIN (GLUCOPHAGE) 1000 MG tablet TAKE ONE TABLET BY MOUTH TWO TIMES A DAY. 180 tablet 0   Multiple Vitamin (MULTIVITAMIN WITH MINERALS) TABS Take 1 tablet by mouth daily.     mupirocin ointment (BACTROBAN) 2 % Apply 1 application topically 2 (two) times daily. 30 g 0   NOVOLOG 100 UNIT/ML injection Inject 10 units into skin with breakfast and 10 units into skin at supper 10 mL 5   ondansetron (ZOFRAN) 8 MG tablet TAKE ONE TABLET BY MOUTH EVERY 12 HOURS AS NEEDED FOR NAUSEA 20 tablet 5   ONETOUCH VERIO test strip USE AS DIRECTED TO TEST BLOOD GLUCOSE 4 TIMES DAILY. 200 strip 0   pantoprazole (PROTONIX) 40 MG tablet Take 1 tablet (40 mg total) by mouth 2 (two) times daily before a meal. 60 tablet 3   promethazine (PHENERGAN) 25 MG tablet TAKE ONE TABLET BY MOUTH EVERY EIGHT HOURS AS NEEDED FOR NAUSEA 30 tablet 5   rosuvastatin (CRESTOR) 20 MG tablet Take 1 tablet (20 mg total) by mouth daily. 90 tablet 1   triamcinolone cream (KENALOG) 0.1 % Apply BID prn 45 g 3   trimethoprim (TRIMPEX) 100 MG tablet Take 1 tablet (100 mg total) by mouth daily. 90 tablet 3   venlafaxine (EFFEXOR) 75 MG tablet TAKE ONE (1) TABLET BY MOUTH 3 TIMES DAILY 270 tablet 1   No current facility-administered medications for this visit.    Allergies as of 09/13/2021 - Review Complete 09/13/2021  Allergen Reaction Noted   Byetta 10 mcg pen [exenatide] Diarrhea and Nausea And Vomiting 02/02/2013   Naproxen Nausea And Vomiting 07/15/2009   Pollen extract  05/05/2020   Augmentin [amoxicillin-pot clavulanate]  03/16/2014   Azithromycin Nausea And Vomiting and Rash 07/15/2009   Erythromycin Hives 02/02/2013   Invokana [canagliflozin]  09/13/2014   Morphine Hives and Rash    Sulfonamide derivatives Nausea And Vomiting and Rash      Family History  Problem Relation Age of Onset   Breast cancer Mother    Diabetes Father    Coronary artery disease Other    Arthritis Other    Asthma Other    Diabetes Sister    Colon cancer Neg Hx     Social History   Socioeconomic History   Marital status: Married    Spouse name: Not on file   Number of children:  Not on file   Years of education: Not on file   Highest education level: Not on file  Occupational History   Occupation: nurse tech    Comment: [employer], 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)    Employer: [employer]  Tobacco Use   Smoking status: Former    Packs/day: 0.50    Years: 3.00    Pack years: 1.50    Types: Cigarettes    Quit date: 09/11/1987    Years since quitting: 34.0   Smokeless tobacco: Never  Substance and Sexual Activity   Alcohol use: No   Drug use: No   Sexual activity: Not Currently    Birth control/protection: Surgical  Other Topics Concern   Not on file  Social History Narrative   Not on file   Social Determinants of Health   Financial Resource Strain: Low Risk    Difficulty of Paying Living Expenses: Not hard at all  Food Insecurity: No Food Insecurity   Worried About Charity fundraiser in the Last Year: Never true   [place] in the Last Year: Never true  Transportation Needs: No Transportation Needs   Lack of Transportation (Medical): No   Lack of Transportation (Non-Medical): No  Physical Activity: Insufficiently Active   Days of Exercise per Week: 2 days   Minutes of Exercise per Session: 20 min  Stress: No Stress Concern Present   Feeling of Stress : Not at all  Social Connections: Moderately Isolated   Frequency of Communication with Friends and Family: Twice a week   Frequency of Social Gatherings with Friends and Family: More than three times a week   Attends Religious Services: Never   Marine scientist or Organizations: No   Attends Music therapist: Never   Marital Status:  Married    Review of Systems: Gen: Denies fever, chills, cold or flulike symptoms, presyncope, syncope. CV: Denies chest pain, palpitations. Resp: Denies dyspnea at rest or cough. GI: See HPI Heme: See HPI  Physical Exam: BP 139/86   Pulse 82   Temp (!) 97.1 F (36.2 C) (Temporal)   Ht 5' 2"  (1.575 m)   Wt 202 lb 9.6 oz (91.9 kg)   BMI 37.06 kg/m  General:   Alert and oriented. No distress noted. Pleasant and cooperative.  Head:  Normocephalic and atraumatic. Eyes:  Conjuctiva clear without scleral icterus. Heart:  S1, S2 present without murmurs appreciated. Lungs:  Clear to auscultation bilaterally. No wheezes, rales, or rhonchi. No distress.  Abdomen:  +BS, soft, and non-distended. Mild TTP in epigastric area. No rebound or guarding. No HSM or masses noted. Msk:  Symmetrical without gross deformities. Normal posture. Extremities:  Without edema. Neurologic:  Alert and  oriented x4 Psych:  Normal mood and affect.    Assessment:  65 y.o. female  with history of stroke, diabetes, rheumatoid arthritis, anxiety/depression, HTN, HLD, GERD, constipation, chronic nausea with vomiting, and gastroparesis, presenting today for follow-up of chronic nausea and vomiting, early satiety, epigastric abdominal pain, and constipation.  Gastroparesis:  Abnormal gastric emptying study November 2021. Due to frequent nausea and vomiting, early satiety, and epigastric abdominal pain, PPI was increased to twice daily in May 2022 and she had EGD in June to rule out other etiologies which revealed gastritis (pathology negative for H. Pylori), normal duodenum biopsied (benign small bowel mucosa).   Overall, suspect upper GI symptoms are primarily secondary to gastroparesis in the setting of diabetes, likely exacerbated by chronic pain  medications.  Gastritis may have also been playing a role. Encouragingly, patient has had good clinical response to increasing PPI dosing and following a gastroparesis diet.  She is pleased with her current regimen. We discussed gastroparesis extensively today with the goal to provide more good days than bad days as this isn't a condition that we cure with medications. Also discussed the importance of maintaining good control of diabetes. We will continue to hold off on Reglan unless absolutely necessary.   GERD:  Well-controlled on Protonix 40 mg twice daily.  Constipation:  Chronic.  Present since a young age, likely exacerbated by chronic pain medications.  Not adequately managed with MiraLAX daily.  No alarm symptoms.  Colonoscopy up-to-date, surveillance due in 2023.  In light of gastroparesis, would like to trial Motegrity to see if this may improve her upper and lower GI symptoms.  She does have history of mildly elevated creatinine and reports upcoming blood work in the next 1-2 weeks with Dr. Wolfgang Phoenix.  We will follow-up on these labs to ensure GFR remains greater than 30 and plan to start Motegrity 2 mg daily thereafter.  For now, she will increase MiraLAX to twice daily.    Plan: Continue Protonix 40 mg twice daily. Continue Zofran 8 mg every 8 hours as needed. Continue Phenergan for breakthrough nausea/vomiting if needed. Continue following gastroparesis diet.  Provided written instructions on this again today. Follow-up on labs with Dr. Wolfgang Phoenix in a couple of weeks.  As long as GFR remains greater than 30, plan to trial Motegrity 2 mg daily. For now, increase MiraLAX to 17 g twice daily in 8 ounces of water. Follow-up in 4-6 months.  Call sooner if needed.   Aliene Altes, PA-C [facility] 09/13/2021

## 2021-09-13 ENCOUNTER — Ambulatory Visit (INDEPENDENT_AMBULATORY_CARE_PROVIDER_SITE_OTHER): Payer: PPO | Admitting: Gastroenterology

## 2021-09-13 ENCOUNTER — Encounter: Payer: Self-pay | Admitting: Gastroenterology

## 2021-09-13 ENCOUNTER — Other Ambulatory Visit: Payer: Self-pay

## 2021-09-13 VITALS — BP 139/86 | HR 82 | Temp 97.1°F | Ht 62.0 in | Wt 202.6 lb

## 2021-09-13 DIAGNOSIS — K3184 Gastroparesis: Secondary | ICD-10-CM | POA: Diagnosis not present

## 2021-09-13 DIAGNOSIS — K219 Gastro-esophageal reflux disease without esophagitis: Secondary | ICD-10-CM | POA: Diagnosis not present

## 2021-09-13 DIAGNOSIS — K5900 Constipation, unspecified: Secondary | ICD-10-CM

## 2021-09-13 NOTE — Patient Instructions (Addendum)
Continue Protonix 40 mg twice daily 30 minutes before breakfast and dinner.  Continue using Zofran 8 mg up to every 8 hours as needed.  Continue to use Phenergan as needed.  Continue to follow a gastroparesis diet: Low fiber/low fat diet Avoid consuming a lot of raw vegetables and fruits. Eat 4-6 small meals daily. Do not eat within 3 hours of laying down.  For constipation: I will follow-up on your upcoming blood work with Dr. Wolfgang Phoenix.  Depending on these results, I would like to try you on a medication called Motegrity. For now, increase MiraLAX to 17 g twice daily in 8 ounces of water.   We will plan to follow-up with you in 4-6 months.  Do not hesitate to call if you have questions or concerns prior to this.   I am glad you are feeling much better!  It was great to see you today!  Aliene Altes, PA-C [facility]

## 2021-09-14 ENCOUNTER — Encounter: Payer: Self-pay | Admitting: Gastroenterology

## 2021-09-20 ENCOUNTER — Ambulatory Visit: Payer: PPO | Admitting: Orthopedic Surgery

## 2021-09-20 ENCOUNTER — Encounter: Payer: Self-pay | Admitting: Orthopedic Surgery

## 2021-09-20 ENCOUNTER — Other Ambulatory Visit: Payer: Self-pay

## 2021-09-20 VITALS — BP 135/80 | HR 86 | Ht 63.0 in | Wt 201.8 lb

## 2021-09-20 DIAGNOSIS — M65312 Trigger thumb, left thumb: Secondary | ICD-10-CM

## 2021-09-20 DIAGNOSIS — E785 Hyperlipidemia, unspecified: Secondary | ICD-10-CM | POA: Diagnosis not present

## 2021-09-20 DIAGNOSIS — E1159 Type 2 diabetes mellitus with other circulatory complications: Secondary | ICD-10-CM | POA: Diagnosis not present

## 2021-09-20 DIAGNOSIS — Z79899 Other long term (current) drug therapy: Secondary | ICD-10-CM | POA: Diagnosis not present

## 2021-09-20 DIAGNOSIS — M65311 Trigger thumb, right thumb: Secondary | ICD-10-CM | POA: Diagnosis not present

## 2021-09-20 DIAGNOSIS — I10 Essential (primary) hypertension: Secondary | ICD-10-CM | POA: Diagnosis not present

## 2021-09-20 DIAGNOSIS — E1169 Type 2 diabetes mellitus with other specified complication: Secondary | ICD-10-CM | POA: Diagnosis not present

## 2021-09-20 NOTE — Progress Notes (Signed)
EVALUATION AND MANAGEMENT   Type of appointment : NEW PATIENT (3 YR RULE )  PLAN: Bilateral thumb injections A1 pulley  No orders of the defined types were placed in this encounter.    Chief Complaint  Patient presents with   Hand Pain    Bilateral-hurts to open bottles or jars or squeeze...hurting at thumbs x 86 mths    65 year old female bilateral thumb pain over the A1 pulley clicking popping catching no prior treatment   Review of Systems  Constitutional:  Negative for fever.  Respiratory:  Negative for shortness of breath.   Cardiovascular:  Negative for chest pain.    Body mass index is 35.75 kg/m.  Physical Exam Constitutional:      General: She is not in acute distress.    Appearance: She is well-developed.     Comments: Well developed, well nourished Normal grooming and hygiene     Cardiovascular:     Comments: No peripheral edema Skin:    General: Skin is warm and dry.  Neurological:     Mental Status: She is alert and oriented to person, place, and time.     Sensory: No sensory deficit.     Coordination: Coordination normal.     Gait: Gait normal.     Deep Tendon Reflexes: Reflexes are normal and symmetric.  Psychiatric:        Mood and Affect: Mood normal.        Behavior: Behavior normal.        Thought Content: Thought content normal.        Judgment: Judgment normal.     Comments: Affect normal     Past Medical History:  Diagnosis Date   Anxiety    Asthma    Depression    Essential hypertension    Fatty liver    Gastritis    Gastroparesis    GERD (gastroesophageal reflux disease)    Hiatal hernia    History of cardiac catheterization    Minimal coronary atherosclerosis February 2016   History of kidney stones    History of stroke 2008   HLD (hyperlipidemia)    Neuropathy    Obesity    Pancreatitis    Rheumatoid arthritis(714.0)    Stroke ([place]) 2008   Stroke ([place]) 01/2020   Tibialis tendinitis    Type 2 diabetes mellitus ([place])     Past Surgical History:  Procedure Laterality Date   ANKLE SURGERY     remove extra bone-left   BIOPSY  04/25/2021   Procedure: BIOPSY;  Surgeon: Eloise Harman, DO;  [facility];  Service: Endoscopy;;   CARPAL TUNNEL RELEASE     right side   CARPAL TUNNEL RELEASE Left 02/06/2013   Procedure: CARPAL TUNNEL RELEASE ;  Surgeon: Carole Civil, MD;  [facility];  Service: Orthopedics;  Laterality: Left;  procedure end [place] Right 09/14/2015   Procedure: RIGHT CARPAL TUNNEL RELEASE;  Surgeon: Carole Civil, MD;  [facility];  Service: Orthopedics;  Laterality: Right;   CARPAL TUNNEL RELEASE Left 09/28/2015   Procedure: LEFT CARPAL TUNNEL RELEASE;  Surgeon: Carole Civil, MD;  [facility];  Service: Orthopedics;  Laterality: Left;  procedure 1   CESAREAN SECTION     CHOLECYSTECTOMY     COLONOSCOPY WITH PROPOFOL  09/16/2012   HDQ:QIWL sessile polyps ranging between 3-42mm in size were found in the sigmoid colon and rectum (hyperplastic)/Mild diverticulosis was noted throughout the entire  examined colon/Small internal hemorrhoids. Repeat in 10 years.   DILATATION & CURETTAGE/HYSTEROSCOPY WITH MYOSURE N/A 08/05/2019   Procedure: DILATATION & CURETTAGE/HYSTEROSCOPY WITH MYOSURE;  Surgeon: Sloan Leiter, MD;  Location: [place];  Service: Gynecology;  Laterality: N/A;  myosure rep will be here.  Confirmed on 07/30/19 CS   DILATION AND CURETTAGE OF UTERUS     multiple   ESOPHAGOGASTRODUODENOSCOPY  07/29/2009   Mild gastritis, benign path   ESOPHAGOGASTRODUODENOSCOPY (EGD) WITH PROPOFOL  09/16/2012   SLF:Non-erosive gastritis (inflammation) was found; multiple bx/The duodenal mucosa showed no abnormalities in the ampulla and bulb and second portion of the duodenum/NAUSEA/VOMITING MOST LIKELY DUE TO GERD/GASTRITIS   ESOPHAGOGASTRODUODENOSCOPY (EGD) WITH PROPOFOL N/A 04/25/2021   Surgeon: Eloise Harman, DO;  Gastritis biopsied, normal examined duodenum biopsied.  Pathology revealed focal chronic gastritis, negative for H. Pylori, benign small bowel mucosa.   FINGER SURGERY Left    left little finger-otif of finger   Gastric Emptying  07/27/2009   The amount of activity in the stomach at 120 minutes was 13% which is in the normal range   LASER ABLATION OF THE CERVIX     LEFT AND RIGHT HEART CATHETERIZATION WITH CORONARY ANGIOGRAM N/A 01/03/2015   Procedure: LEFT AND RIGHT HEART CATHETERIZATION WITH CORONARY ANGIOGRAM;  Surgeon: Blane Ohara, MD;  Location: [facility];  Service: Cardiovascular;  Laterality: N/A;   LITHOTRIPSY     POLYPECTOMY  09/16/2012   Procedure: POLYPECTOMY;  Surgeon: Danie Binder, MD;  [facility];  Service: Endoscopy;  Laterality: N/A;   SAVORY DILATION  09/16/2012   Procedure: SAVORY DILATION;  Surgeon: Danie Binder, MD;  [facility];  Service: Endoscopy;  Laterality: N/A;  16 fr dilation   TRIGGER FINGER RELEASE Left 02/06/2013   Procedure: RELEASE TRIGGER FINGER LEFT LONG FINGER/A-1 PULLEY;  Surgeon: Carole Civil, MD;  [facility];  Service: Orthopedics;  Laterality: Left;  procedure began [place] Right 09/14/2015   Procedure: RIGHT LONG FINGER TRIGGER RELEASE;  Surgeon: Carole Civil, MD;  Location[facility];  Service: Orthopedics;  Laterality: Right;   TRIGGER FINGER RELEASE Left 09/28/2015   Procedure: LEFT RING TRIGGER FINGER RELEASE;  Surgeon: Carole Civil, MD;  Location: [facility];  Service: Orthopedics;  Laterality: Left;  left ring finger   TUBAL LIGATION     Social History   Tobacco Use   Smoking status: Former    Packs/day: 0.50    Years: 3.00    Pack years: 1.50    Types: Cigarettes    Quit date: 09/11/1987    Years since quitting: 34.0   Smokeless tobacco: Never  Substance Use Topics   Alcohol use: No   Drug use: No     Assessment and Plan:  Encounter Diagnoses  Name Primary?   Trigger  thumb of right hand Yes   Trigger finger of left thumb    Procedure note  Injection  Verbal consent was obtained to inject the  LEFT THUMB  Timeout procedure was completed to confirm injection site  Diagnosis TRIIGER THUMB LEFT   Medications used DEPOMEDROL 40 MG  Lidocaine 1% plain 3 cc  Anesthesia was provided by ethyl chloride spray  Prep was performed with alcohol  Technique of injection injection was performed just proximal to the A1 pulley and the tendon sheath  No complications were noted  Procedure note  Injection  Verbal consent was obtained to inject the right thumb  Timeout procedure was completed to confirm injection site  Diagnosis triggering of the right thumb  Medications used Depo-Medrol 40 mg Lidocaine 1% plain 3 cc  Anesthesia was provided by ethyl chloride spray  Prep was performed with alcohol  Technique of injection injection performed tendon sheath just proximal to the A1 pulley  No complications were noted

## 2021-09-21 LAB — HEPATIC FUNCTION PANEL
AG Ratio: 1.4 (calc) (ref 1.0–2.5)
ALT: 12 U/L (ref 6–29)
AST: 14 U/L (ref 10–35)
Albumin: 4.2 g/dL (ref 3.6–5.1)
Alkaline phosphatase (APISO): 45 U/L (ref 37–153)
Bilirubin, Direct: 0.1 mg/dL (ref 0.0–0.2)
Globulin: 3 g/dL (calc) (ref 1.9–3.7)
Indirect Bilirubin: 0.2 mg/dL (calc) (ref 0.2–1.2)
Total Bilirubin: 0.3 mg/dL (ref 0.2–1.2)
Total Protein: 7.2 g/dL (ref 6.1–8.1)

## 2021-09-21 LAB — LIPID PANEL
Cholesterol: 137 mg/dL (ref <200)
HDL: 45 mg/dL — ABNORMAL LOW (ref >=50)
LDL Cholesterol (Calc): 70 mg/dL (calc)
Non-HDL Cholesterol (Calc): 92 mg/dL (calc) (ref <130)
Total CHOL/HDL Ratio: 3 (calc) (ref <5.0)
Triglycerides: 140 mg/dL (ref <150)

## 2021-09-21 LAB — BASIC METABOLIC PANEL
BUN: 16 mg/dL (ref 7–25)
CO2: 22 mmol/L (ref 20–32)
Calcium: 9.6 mg/dL (ref 8.6–10.4)
Chloride: 106 mmol/L (ref 98–110)
Creat: 0.92 mg/dL (ref 0.50–1.05)
Glucose, Bld: 192 mg/dL — ABNORMAL HIGH (ref 65–99)
Potassium: 4.2 mmol/L (ref 3.5–5.3)
Sodium: 139 mmol/L (ref 135–146)

## 2021-09-21 LAB — HEMOGLOBIN A1C
Hgb A1c MFr Bld: 7.4 % of total Hgb — ABNORMAL HIGH (ref <5.7)
Mean Plasma Glucose: 166 mg/dL
eAG (mmol/L): 9.2 mmol/L

## 2021-09-27 ENCOUNTER — Telehealth: Payer: Self-pay | Admitting: Gastroenterology

## 2021-09-27 ENCOUNTER — Other Ambulatory Visit: Payer: Self-pay | Admitting: Gastroenterology

## 2021-09-27 DIAGNOSIS — K5909 Other constipation: Secondary | ICD-10-CM

## 2021-09-27 MED ORDER — MOTEGRITY 2 MG PO TABS: 2.0000 mg | ORAL_TABLET | Freq: Every day | ORAL | 3 refills | Status: DC

## 2021-09-27 NOTE — Telephone Encounter (Signed)
Please let patient know I reviewed her recent blood work completed 11/2. Her kidney function is within normal limits. We can try her on Motegrity 2 mg daily for constipation. I will send a Rx to her pharmacy. When she starts Motegrity, she can stop MiraLAX to see how she does. If needed, she can resume MiraLAX daily with Motegrity.

## 2021-09-28 ENCOUNTER — Other Ambulatory Visit: Payer: Self-pay | Admitting: Gastroenterology

## 2021-09-28 DIAGNOSIS — K219 Gastro-esophageal reflux disease without esophagitis: Secondary | ICD-10-CM

## 2021-09-28 DIAGNOSIS — K3184 Gastroparesis: Secondary | ICD-10-CM

## 2021-09-28 DIAGNOSIS — R112 Nausea with vomiting, unspecified: Secondary | ICD-10-CM

## 2021-09-28 NOTE — Telephone Encounter (Signed)
LMOM for pt to call office back 

## 2021-10-03 ENCOUNTER — Ambulatory Visit (INDEPENDENT_AMBULATORY_CARE_PROVIDER_SITE_OTHER): Payer: PPO | Admitting: Family Medicine

## 2021-10-03 ENCOUNTER — Other Ambulatory Visit: Payer: Self-pay

## 2021-10-03 ENCOUNTER — Encounter: Payer: Self-pay | Admitting: Family Medicine

## 2021-10-03 VITALS — BP 136/84 | Temp 96.4°F | Wt 203.6 lb

## 2021-10-03 DIAGNOSIS — Z23 Encounter for immunization: Secondary | ICD-10-CM

## 2021-10-03 DIAGNOSIS — M7060 Trochanteric bursitis, unspecified hip: Secondary | ICD-10-CM | POA: Diagnosis not present

## 2021-10-03 DIAGNOSIS — G894 Chronic pain syndrome: Secondary | ICD-10-CM | POA: Diagnosis not present

## 2021-10-03 DIAGNOSIS — I10 Essential (primary) hypertension: Secondary | ICD-10-CM

## 2021-10-03 DIAGNOSIS — E7849 Other hyperlipidemia: Secondary | ICD-10-CM

## 2021-10-03 DIAGNOSIS — G5601 Carpal tunnel syndrome, right upper limb: Secondary | ICD-10-CM

## 2021-10-03 DIAGNOSIS — M76829 Posterior tibial tendinitis, unspecified leg: Secondary | ICD-10-CM | POA: Diagnosis not present

## 2021-10-03 DIAGNOSIS — G5602 Carpal tunnel syndrome, left upper limb: Secondary | ICD-10-CM

## 2021-10-03 DIAGNOSIS — E1159 Type 2 diabetes mellitus with other circulatory complications: Secondary | ICD-10-CM | POA: Diagnosis not present

## 2021-10-03 DIAGNOSIS — T148XXA Other injury of unspecified body region, initial encounter: Secondary | ICD-10-CM

## 2021-10-03 MED ORDER — LISINOPRIL 10 MG PO TABS: 10.0000 mg | ORAL_TABLET | Freq: Every day | ORAL | 1 refills | Status: DC

## 2021-10-03 MED ORDER — AMLODIPINE BESYLATE 5 MG PO TABS: 5.0000 mg | ORAL_TABLET | Freq: Every day | ORAL | 1 refills | Status: DC

## 2021-10-03 MED ORDER — METFORMIN HCL 1000 MG PO TABS: ORAL_TABLET | ORAL | 1 refills | Status: DC

## 2021-10-03 MED ORDER — ONETOUCH VERIO VI STRP: ORAL_STRIP | 0 refills | Status: DC

## 2021-10-03 MED ORDER — CETIRIZINE HCL 10 MG PO TABS: ORAL_TABLET | ORAL | 1 refills | Status: DC

## 2021-10-03 MED ORDER — CLOPIDOGREL BISULFATE 75 MG PO TABS: ORAL_TABLET | ORAL | 1 refills | Status: DC

## 2021-10-03 MED ORDER — ROSUVASTATIN CALCIUM 20 MG PO TABS: 20.0000 mg | ORAL_TABLET | Freq: Every day | ORAL | 1 refills | Status: DC

## 2021-10-03 MED ORDER — VENLAFAXINE HCL 75 MG PO TABS: ORAL_TABLET | ORAL | 1 refills | Status: DC

## 2021-10-03 MED ORDER — MUPIROCIN 2 % EX OINT: 1.0000 "application " | TOPICAL_OINTMENT | Freq: Two times a day (BID) | CUTANEOUS | 0 refills | Status: DC

## 2021-10-03 NOTE — Progress Notes (Signed)
Subjective:    Patient ID: Jamie Harding, female    DOB: Dec 12, 1955, 65 y.o.   MRN: [id number]  Diabetes She presents for her follow-up diabetic visit. She has type 2 diabetes mellitus. There are no hypoglycemic associated symptoms. There are no diabetic associated symptoms. There are no hypoglycemic complications. She is compliant with treatment all of the time. She does not see a podiatrist.Eye exam is not current.   Pt had steroid shots in hands last week and sugars have been in the 300s fasting.  This patient was seen today for chronic pain  The medication list was reviewed and updated.  Location of Pain for which the patient has been treated with regarding narcotics: BIL hands, Hips and legs.    -Compliance with medication: Hydrocodone 7.5-325 mg  - Number patient states they take daily: 4  -when was the last dose patient took? This afternoon  The patient was advised the importance of maintaining medication and not using illegal substances with these.  Here for refills and follow up  The patient was educated that we can provide 3 monthly scripts for their medication, it is their responsibility to follow the instructions.  Side effects or complications from medications: none  Patient is aware that pain medications are meant to minimize the severity of the pain to allow their pain levels to improve to allow for better function. They are aware of that pain medications cannot totally remove their pain.  Due for UDT ( at least once per year) : 07/07/21  Scale of 1 to 10 ( 1 is least 10 is most) Your pain level without the medicine: 10 Your pain level with medication : 5  Scale 1 to 10 ( 1-helps very little, 10 helps very well) How well does your pain medication reduce your pain so you can function better through out the day? 4  Patient states that her puppy accidentally bit her on her left hand. Patient states that area is healing and she has been using mupirocin on it.  Patient also admits to having several abrasions on her BIL arms that she uses mupirocin on without difficulty.   Review of Systems  Patient denies chest pain or tightness Patient denies weakness Patient denies respiratory distress  Patient admits to have chronic SOB relieved by rest or Albuterol inhaler Patient admits to pain in BIL hands and legs improved with Norco    Objective:   Physical Exam Constitutional:      Appearance: Normal appearance.  Cardiovascular:     Rate and Rhythm: Normal rate and regular rhythm.     Pulses: Normal pulses.     Heart sounds: No murmur heard.   No gallop.  Pulmonary:     Effort: Pulmonary effort is normal. No respiratory distress.     Breath sounds: Normal breath sounds. No wheezing.  Abdominal:     General: Bowel sounds are normal.     Palpations: Abdomen is soft.  Skin:    General: Skin is warm and dry.     Comments: - 2 small healing lacerations noted to left hand. No s/s of infection. - multiple small skin abrasions in various stages of healing noted to BIL arms. No s/s of infection.   Neurological:     General: No focal deficit present.     Mental Status: She is alert and oriented to person, place, and time.  Psychiatric:        Mood and Affect: Mood normal.  Behavior: Behavior normal.        Assessment & Plan:   1. Need for vaccination - Flu Vaccine QUAD 6+ mos PF IM (Fluarix Quad PF)  2. Type 2 diabetes mellitus with vascular disease ([place]) - patient reports fasting glucose of 383 after receiving steroid shots for BIL hand pain - A1C at 7.4; improved from 8.3 at previous visit. - BS likely dysregulated secondary to steroid use.  - Temporarily, increase long acting insulin to 76 units or up to 80 units if fasting BS still over 120. - Monitor for s/s of hypoglycemia and decrease units of insulin back to baseline as needed. - A1C ordered for next visit. - Chem 7 with GFR ordered for next visit. - Foot exam conducted  today. - Encouraged patient to get eye exam.  3. Essential hypertension - BP currently stable. - BP 130/72 today. - No changes to BP meds necessary at this time. - Continue to monitor BP at home.  4. Other hyperlipidemia - LDL at target goal of 70. - HDL slightly low at 45. - Encouraged consumption of healthy fats. - No changes cholesterol meds necessary at this time.  5. Carpal tunnel syndrome - Continue with Norco for pain relief.  6. Tibialis Tendinitis - Continue with Norco for pain relief.  7. Greater trochanteric bursitis - Continue with Norco for pain relief.  8. Skin wounds - Continue using mupirocin on areas. - Contact clinic if areas become warm to touch, reddened, inflamed, infected, or non-healing.

## 2021-10-04 MED ORDER — HYDROCODONE-ACETAMINOPHEN 7.5-325 MG PO TABS: 1.0000 | ORAL_TABLET | Freq: Four times a day (QID) | ORAL | 0 refills | Status: DC | PRN

## 2021-10-04 MED ORDER — HYDROCODONE-ACETAMINOPHEN 7.5-325 MG PO TABS: ORAL_TABLET | ORAL | 0 refills | Status: DC

## 2021-10-16 ENCOUNTER — Other Ambulatory Visit: Payer: Self-pay | Admitting: Family Medicine

## 2021-11-02 ENCOUNTER — Other Ambulatory Visit: Payer: Self-pay | Admitting: Family Medicine

## 2021-12-11 ENCOUNTER — Telehealth: Payer: Self-pay | Admitting: Family Medicine

## 2021-12-11 NOTE — Telephone Encounter (Signed)
PA for Ondansetron 8 mg table has been approved. Approval good through 11/18/22

## 2022-01-01 ENCOUNTER — Other Ambulatory Visit: Payer: Self-pay | Admitting: Family Medicine

## 2022-01-01 DIAGNOSIS — G894 Chronic pain syndrome: Secondary | ICD-10-CM

## 2022-01-01 MED ORDER — HYDROCODONE-ACETAMINOPHEN 7.5-325 MG PO TABS: 1.0000 | ORAL_TABLET | Freq: Four times a day (QID) | ORAL | 0 refills | Status: DC | PRN

## 2022-01-01 NOTE — Telephone Encounter (Signed)
Pt is almost out of her pain medication, Hydrocodone. She needs a refill. She has an appt with Dr Nicki Reaper 01/29/2022.[employer].   [phone]

## 2022-01-02 ENCOUNTER — Encounter: Payer: Self-pay | Admitting: Family Medicine

## 2022-01-04 ENCOUNTER — Ambulatory Visit: Payer: PPO | Admitting: Family Medicine

## 2022-01-29 ENCOUNTER — Encounter: Payer: Self-pay | Admitting: Family Medicine

## 2022-01-29 ENCOUNTER — Ambulatory Visit (INDEPENDENT_AMBULATORY_CARE_PROVIDER_SITE_OTHER): Payer: PPO | Admitting: Family Medicine

## 2022-01-29 ENCOUNTER — Other Ambulatory Visit: Payer: Self-pay

## 2022-01-29 VITALS — BP 134/72 | Temp 96.6°F | Wt 206.8 lb

## 2022-01-29 DIAGNOSIS — G894 Chronic pain syndrome: Secondary | ICD-10-CM

## 2022-01-29 DIAGNOSIS — E1159 Type 2 diabetes mellitus with other circulatory complications: Secondary | ICD-10-CM

## 2022-01-29 DIAGNOSIS — Z79891 Long term (current) use of opiate analgesic: Secondary | ICD-10-CM

## 2022-01-29 DIAGNOSIS — E7849 Other hyperlipidemia: Secondary | ICD-10-CM

## 2022-01-29 DIAGNOSIS — I10 Essential (primary) hypertension: Secondary | ICD-10-CM | POA: Diagnosis not present

## 2022-01-29 DIAGNOSIS — Z23 Encounter for immunization: Secondary | ICD-10-CM | POA: Diagnosis not present

## 2022-01-29 MED ORDER — HYDROCODONE-ACETAMINOPHEN 7.5-325 MG PO TABS: ORAL_TABLET | ORAL | 0 refills | Status: DC

## 2022-01-29 MED ORDER — HYDROCODONE-ACETAMINOPHEN 7.5-325 MG PO TABS: 1.0000 | ORAL_TABLET | Freq: Four times a day (QID) | ORAL | 0 refills | Status: DC | PRN

## 2022-01-29 NOTE — Patient Instructions (Addendum)
Please do your lab work ? ?We will see you back in 3 months ? ?Have Evan fax [name] any approval form they need ?[phone] ?

## 2022-01-29 NOTE — Progress Notes (Signed)
? ?Subjective:  ? ? Patient ID: Jamie Harding, female    DOB: August 31, 1956, 66 y.o.   MRN: [id number] ? ?HPI ?This patient was seen today for chronic pain ? ?The medication list was reviewed and updated. ? ?Location of Pain for which the patient has been treated with regarding narcotics:  ? ?Onset of this pain:  ? ? -Compliance with medication: Hydrocodone 7.5-325 mg  ? ?- Number patient states they take daily: 4 ? ?-when was the last dose patient took? This morning about 8:30 ? ?The patient was advised the importance of maintaining medication and not using illegal substances with these. ? ?Here for refills and follow up ? ?The patient was educated that we can provide 3 monthly scripts for their medication, it is their responsibility to follow the instructions. ? ?Side effects or complications from medications: none ? ?Patient is aware that pain medications are meant to minimize the severity of the pain to allow their pain levels to improve to allow for better function. They are aware of that pain medications cannot totally remove their pain. ? ?Due for UDT ( at least once per year) : completed 07/07/21  ? ?Scale of 1 to 10 ( 1 is least 10 is most) ?Your pain level without the medicine: 9 ?Your pain level with medication: 2 ? ?Scale 1 to 10 ( 1-helps very little, 10 helps very well) ?How well does your pain medication reduce your pain so you can function better through out the day? Depends on activity  ? ?Quality of the pain: Throbbing aching pain lower back worse into the legs ? ?Persistence of the pain: Present all the time ? ?Modifying factors: Worse with activity ? ?Chronic pain syndrome ? ?Type 2 diabetes mellitus with vascular disease ([place]), Chronic - Plan: Hemoglobin V6H, Basic Metabolic Panel (BMET) ? ?Long-term current use of opiate analgesic ? ?Need for vaccination - Plan: Pneumococcal conjugate vaccine 20-valent (Prevnar 20) ? ?Essential hypertension - Plan: Hemoglobin Y0V, Basic Metabolic Panel  (BMET) ? ?Morbid obesity due to excess calories ([place]), Chronic ? ?Diabetes fairly decent control with medications and diet although at times it does get out of control we have encouraged her to work hard at minimizing starches ? ?Blood pressure decent control with medications ? ?Chronic low back pain due to sciatica issues recommend stretches on a regular basis ? ?Also has gastroparesis related to diabetes is not a GLP-1 candidate   ? ? ?Review of Systems ? ?   ?Objective:  ? Physical Exam ? ?General-in no acute distress ?Eyes-no discharge ?Lungs-respiratory rate normal, CTA ?CV-no murmurs,RRR ?Extremities skin warm dry no edema ?Neuro grossly normal ?Behavior normal, alert ?Diabetic foot exam normal ? ? ?   ?Assessment & Plan:  ?Diabetes-fair control could be better minimize starches stay active check labs before next visit ? ?1. Chronic pain syndrome ?The patient was seen in followup for chronic pain. ?A review over at their current pain status was discussed. Drug registry was checked. ?Prescriptions were given.  Regular follow-up recommended. ?Discussion was held regarding the importance of compliance with medication as well as pain medication contract. ? ?Patient was informed that medication may cause drowsiness and should not be combined  with other medications/alcohol or street drugs. If the patient feels medication is causing altered alertness then do not drive or operate dangerous equipment. ? ?Should be noted that the patient appears to be meeting appropriate use of opioids and response.  Evidenced by improved function and decent pain control without significant side  effects and no evidence of overt aberrancy issues.  Upon discussion with the patient today they understand that opioid therapy is optional and they feel that the pain has been refractory to reasonable conservative measures and is significant and affecting quality of life enough to warrant ongoing therapy and wishes to continue opioids.  Refills  were provided. ?3 scripts will be sent in ?Shared discussion patient would like to continue her medicine ?She states that because of the severity of cavities and having dental work she may have to take 5/day briefly we will allow for 5/day for 1 month but then go back to 4/day ? ?2. Type 2 diabetes mellitus with vascular disease ([place]) ?Fair control check A1c check labs watch starches stay active ?- Hemoglobin Z9D ?- Basic Metabolic Panel (BMET) ? ?3. Long-term current use of opiate analgesic ?See discussion above ? ?4. Need for vaccination ?Today ?- Pneumococcal conjugate vaccine 20-valent (Prevnar 20) ? ?5. Essential hypertension ?Blood pressure good continue medicine watch diet ?- Hemoglobin J5T ?- Basic Metabolic Panel (BMET) ? ?6. Morbid obesity due to excess calories ([place]) ?Portion control watch diet stay active ? ?Follow-up again in 3 months ? ? ?

## 2022-01-31 ENCOUNTER — Encounter: Payer: Self-pay | Admitting: Family Medicine

## 2022-02-07 ENCOUNTER — Encounter: Payer: Self-pay | Admitting: Family Medicine

## 2022-02-07 ENCOUNTER — Other Ambulatory Visit: Payer: Self-pay

## 2022-02-07 ENCOUNTER — Ambulatory Visit (INDEPENDENT_AMBULATORY_CARE_PROVIDER_SITE_OTHER): Payer: PPO | Admitting: Family Medicine

## 2022-02-07 VITALS — BP 124/79 | HR 78 | Temp 98.4°F | Ht 63.0 in | Wt 207.0 lb

## 2022-02-07 DIAGNOSIS — J019 Acute sinusitis, unspecified: Secondary | ICD-10-CM | POA: Diagnosis not present

## 2022-02-07 MED ORDER — DOXYCYCLINE HYCLATE 100 MG PO TABS: 100.0000 mg | ORAL_TABLET | Freq: Two times a day (BID) | ORAL | 0 refills | Status: DC

## 2022-02-07 NOTE — Progress Notes (Signed)
? ?  Subjective:  ? ? Patient ID: Jamie Harding, female    DOB: 1956-10-01, 66 y.o.   MRN: [id number] ? ?HPI ?Sore throat and headache since Sunday taking tylenol ?Negative at home covid test Monday  ?Head congestion sinus pressure sore throat ear pain denies high fever chills sweats ? ?Review of Systems ? ?   ?Objective:  ? Physical Exam ? ?Gen-NAD not toxic ?TMS-normal bilateral ?T- normal no redness ?Chest-CTA respiratory rate normal no crackles ?CV RRR no murmur ?Skin-warm dry ?Neuro-grossly normal ? ?Moderate sinus tenderness to percussion ? ?   ?Assessment & Plan:  ? ?Viral syndrome ?Secondary rhinosinusitis ?Antibiotics prescribed ?Twice daily for 7 days ?COVID test taken await results ?Call back if worse or problems ?

## 2022-02-08 LAB — NOVEL CORONAVIRUS, NAA: SARS-CoV-2, NAA: NOT DETECTED

## 2022-02-09 ENCOUNTER — Encounter: Payer: Self-pay | Admitting: Family Medicine

## 2022-02-11 ENCOUNTER — Encounter: Payer: Self-pay | Admitting: Family Medicine

## 2022-02-12 ENCOUNTER — Other Ambulatory Visit: Payer: Self-pay

## 2022-02-12 ENCOUNTER — Ambulatory Visit (INDEPENDENT_AMBULATORY_CARE_PROVIDER_SITE_OTHER): Payer: PPO | Admitting: Family Medicine

## 2022-02-12 VITALS — Temp 98.2°F

## 2022-02-12 DIAGNOSIS — J019 Acute sinusitis, unspecified: Secondary | ICD-10-CM | POA: Diagnosis not present

## 2022-02-12 MED ORDER — CEFPROZIL 500 MG PO TABS: 500.0000 mg | ORAL_TABLET | Freq: Two times a day (BID) | ORAL | 0 refills | Status: DC

## 2022-02-12 NOTE — Progress Notes (Signed)
Or  ? ?Subjective:  ? ? Patient ID: Jamie Harding, female    DOB: 1956/01/24, 66 y.o.   MRN: [id number] ? ?HPI ?Patient arrives for a follow up on recent illness. Patient states she feels better than last week but still feels achy and has cough- sore throat getting better- finishes the antibiotic tomorrow. ? ?Patient with head congestion sinus pressure not feeling good.  Sore throat ongoing. ?Review of Systems ? ?   ?Objective:  ? Physical Exam ? ?Gen-NAD not toxic ?TMS-normal bilateral ?T- normal no redness ?Chest-CTA respiratory rate normal no crackles ?CV RRR no murmur ?Skin-warm dry ?Neuro-grossly normal ? ? ? ?   ?Assessment & Plan:  ?Stop doxycycline ?No sign of pneumonia ?No need for lab work or x-rays ? ?Acute rhinosinusitis ?Cefzil 7 days as directed ?Follow-up if progressive troubles ?

## 2022-03-01 ENCOUNTER — Encounter: Payer: Self-pay | Admitting: Family Medicine

## 2022-03-01 ENCOUNTER — Other Ambulatory Visit: Payer: Self-pay | Admitting: Family Medicine

## 2022-03-01 MED ORDER — FLUCONAZOLE 150 MG PO TABS: ORAL_TABLET | ORAL | 4 refills | Status: DC

## 2022-03-13 NOTE — Progress Notes (Signed)
? ? ?Referring Provider: Kathyrn Drown, MD ?Primary Care Physician:  Kathyrn Drown, MD ?Primary GI Physician: Dr. Abbey Chatters ? ?Chief Complaint  ?Patient presents with  ? Follow-up  ?  Still nauseated, vomiting    ? ? ?HPI:   ?Jamie Harding is a 66 y.o. female with history of stroke, diabetes, rheumatoid arthritis, anxiety/depression, HTN, HLD, GERD, hyperplastic colon/rectal polyps due for surveillance colonoscopy October 2023, chronic nausea with vomiting, gastroparesis (abnormal GES November 2021), constipation, presenting today for follow-up.  ? ?EGD 04/25/2021: Gastritis biopsied, normal examined duodenum biopsied.  Pathology revealed focal chronic gastritis, negative for H. Pylori, benign small bowel mucosa. ? ?Last seen in our office 09/13/2021.  Gastroparesis symptoms improved with increasing PPI to twice daily and following a gastroparesis diet.  Nausea only about twice a week rather than daily.  Vomiting only once or twice a month, down from twice a week.  GERD was well controlled on Protonix twice daily.  Constipation not adequately managed on MiraLAX daily.  Wanted to try Motegrity in the setting of gastroparesis, but would need to follow-up on labs to ensure GFR was appropriate.  Otherwise, she would use MiraLAX twice daily and continue her current medications, plan to follow-up in 4 to 6 months. ? ?Reviewed labs completed 11/2.  Kidney function within normal limits.  Prescription for Motegrity was sent to pharmacy.  ? ?Today:  ? ?GERD is well controlled on Protonix 40 mg twice daily. No significant breakthrough. No dysphagia. Nausea every 2-3 days, not too bad. Can take Zofran and this will help. Occasionally, she will still have an episode of vomiting. Total of 2-3 times a month. Overall feels symptoms are fairly well controlled.  Does not feel she needs to try Reglan.  We discussed the role of diet.  She states she can eat 1 large meal a day and will have a snack later.  No weight loss. ? ?No  NSAIDs.  ? ?Constipation: ?Never tried Motegrity.  Not sure what happened with this.  She is taking MiraLAX once a week.  No longer constipated. Now having trouble with loose/urgent stools. Started right before Christmas. Not daily. Bowels move every 3-4 days. Will have 3-4 BMs on those days.  Each bowel movement isn't a lot, but she does feel completely emptied at the end. No brbpr or melena. Some mild cramping in her lower abdomen prior to a BM that improves thereafter. ? ?Denies any new medications, dietary changes, stress level changes prior to change in bowel habits. ? ?Past Medical History:  ?Diagnosis Date  ? Anxiety   ? Asthma   ? Depression   ? Essential hypertension   ? Fatty liver   ? Gastritis   ? Gastroparesis   ? GERD (gastroesophageal reflux disease)   ? Hiatal hernia   ? History of cardiac catheterization   ? Minimal coronary atherosclerosis February 2016  ? History of kidney stones   ? History of stroke 2008  ? HLD (hyperlipidemia)   ? Neuropathy   ? Obesity   ? Pancreatitis   ? Rheumatoid arthritis(714.0)   ? Stroke [facility]) 2008  ? Stroke [facility]) 01/2020  ? Tibialis tendinitis   ? Type 2 diabetes mellitus ([place])   ? ? ?Past Surgical History:  ?Procedure Laterality Date  ? ANKLE SURGERY    ? remove extra bone-left  ? BIOPSY  04/25/2021  ? Procedure: BIOPSY;  Surgeon: Eloise Harman, DO;  [facility];  Service: Endoscopy;;  ? CARPAL TUNNEL  RELEASE    ? right side  ? CARPAL TUNNEL RELEASE Left 02/06/2013  ? Procedure: CARPAL TUNNEL RELEASE ;  Surgeon: Carole Civil, MD;  Location: [facility] ORS;  Service: Orthopedics;  Laterality: Left;  procedure end 1135  ? CARPAL TUNNEL RELEASE Right 09/14/2015  ? Procedure: RIGHT CARPAL TUNNEL RELEASE;  Surgeon: Carole Civil, MD;  Location: [facility] ORS;  Service: Orthopedics;  Laterality: Right;  ? CARPAL TUNNEL RELEASE Left 09/28/2015  ? Procedure: LEFT CARPAL TUNNEL RELEASE;  Surgeon: Carole Civil, MD;  Location: [facility] ORS;  Service: Orthopedics;   Laterality: Left;  procedure 1  ? CESAREAN SECTION    ? CHOLECYSTECTOMY    ? COLONOSCOPY WITH PROPOFOL  09/16/2012  ? KVQ:QVZD sessile polyps ranging between 3-80m in size were found in the sigmoid colon and rectum (hyperplastic)/Mild diverticulosis was noted throughout the entire examined colon/Small internal hemorrhoids. Repeat in 10 years.  ? DILATATION & CURETTAGE/HYSTEROSCOPY WITH MYOSURE N/A 08/05/2019  ? Procedure: DILATATION & CURETTAGE/HYSTEROSCOPY WITH MYOSURE;  Surgeon: DSloan Leiter MD;  [facility]  Service: Gynecology;  Laterality: N/A;  myosure rep will be here.  Confirmed on 07/30/19 CS  ? DILATION AND CURETTAGE OF UTERUS    ? multiple  ? ESOPHAGOGASTRODUODENOSCOPY  07/29/2009  ? Mild gastritis, benign path  ? ESOPHAGOGASTRODUODENOSCOPY (EGD) WITH PROPOFOL  09/16/2012  ? SLF:Non-erosive gastritis (inflammation) was found; multiple bx/The duodenal mucosa showed no abnormalities in the ampulla and bulb and second portion of the duodenum/NAUSEA/VOMITING MOST LIKELY DUE TO GERD/GASTRITIS  ? ESOPHAGOGASTRODUODENOSCOPY (EGD) WITH PROPOFOL N/A 04/25/2021  ? Surgeon: CEloise Harman DO; Gastritis biopsied, normal examined duodenum biopsied.  Pathology revealed focal chronic gastritis, negative for H. Pylori, benign small bowel mucosa.  ? FINGER SURGERY Left   ? left little finger-otif of finger  ? Gastric Emptying  07/27/2009  ? The amount of activity in the stomach at 120 minutes was 13% which is in the normal range  ? LASER ABLATION OF THE CERVIX    ? LEFT AND RIGHT HEART CATHETERIZATION WITH CORONARY ANGIOGRAM N/A 01/03/2015  ? Procedure: LEFT AND RIGHT HEART CATHETERIZATION WITH CORONARY ANGIOGRAM;  Surgeon: MBlane Ohara MD;  [facility];  Service: Cardiovascular;  Laterality: N/A;  ? LITHOTRIPSY    ? POLYPECTOMY  09/16/2012  ? Procedure: POLYPECTOMY;  Surgeon: SDanie Binder MD;  Location: [facility];  Service: Endoscopy;  Laterality: N/A;  ? SAVORY DILATION   09/16/2012  ? Procedure: SAVORY DILATION;  Surgeon: SDanie Binder MD;  Location: [facility];  Service: Endoscopy;  Laterality: N/A;  16 fr dilation  ? TRIGGER FINGER RELEASE Left 02/06/2013  ? Procedure: RELEASE TRIGGER FINGER LEFT LONG FINGER/A-1 PULLEY;  Surgeon: SCarole Civil MD;  Location: [facility];  Service: Orthopedics;  Laterality: Left;  procedure began 1136  ? TRIGGER FINGER RELEASE Right 09/14/2015  ? Procedure: RIGHT LONG FINGER TRIGGER RELEASE;  Surgeon: SCarole Civil MD;  Location: [facility];  Service: Orthopedics;  Laterality: Right;  ? TRIGGER FINGER RELEASE Left 09/28/2015  ? Procedure: LEFT RING TRIGGER FINGER RELEASE;  Surgeon: SCarole Civil MD;  Location: [facility];  Service: Orthopedics;  Laterality: Left;  left ring finger  ? TUBAL LIGATION    ? ? ?Current Outpatient Medications  ?Medication Sig Dispense Refill  ? albuterol (VENTOLIN HFA) 108 (90 Base) MCG/ACT inhaler INHALE 2 PUFFS INTO THE LUNGS EVERY 4 (FOUR) HOURS AS NEEDED FOR WHEEZING. 54 g 5  ? amLODipine (NORVASC) 5  MG tablet Take 1 tablet (5 mg total) by mouth daily. 90 tablet 1  ? blood glucose meter kit and supplies Dispense based on patient and insurance preference. Pt checking sugars four times a day . (FOR ICD-10 E10.9, E11.9). 1 each 0  ? busPIRone (BUSPAR) 10 MG tablet Take 1 tablet BID PRN 60 tablet 5  ? cefPROZIL (CEFZIL) 500 MG tablet Take 1 tablet (500 mg total) by mouth 2 (two) times daily. 14 tablet 0  ? cetirizine (ZYRTEC) 10 MG tablet TAKE ONE TABLET BY MOUTH ONCE DAILY. 90 tablet 1  ? clopidogrel (PLAVIX) 75 MG tablet TAKE ONE TABLET BY MOUTH DAILY WITH BREAKFAST 90 tablet 1  ? fluconazole (DIFLUCAN) 150 MG tablet One now and again in 1 week 2 tablet 4  ? fluticasone (FLONASE) 50 MCG/ACT nasal spray Place 2 sprays into both nostrils daily. 48 g 3  ? glucose blood (ONETOUCH VERIO) test strip USE AS DIRECTED TO TEST BLOOD GLUCOSE 4 TIMES DAILY. DX E 11.9 200 strip 0  ? HYDROcodone-acetaminophen (NORCO) 7.5-325 MG  tablet Take 1 tablet by mouth every 6 (six) hours as needed for moderate pain. 120 tablet 0  ? HYDROcodone-acetaminophen (NORCO) 7.5-325 MG tablet Take one tablet po every 6 hrs prn moderate pain 120 tablet 0  ?

## 2022-03-14 ENCOUNTER — Encounter: Payer: Self-pay | Admitting: *Deleted

## 2022-03-14 ENCOUNTER — Ambulatory Visit (INDEPENDENT_AMBULATORY_CARE_PROVIDER_SITE_OTHER): Payer: HMO | Admitting: Gastroenterology

## 2022-03-14 ENCOUNTER — Encounter: Payer: Self-pay | Admitting: Gastroenterology

## 2022-03-14 VITALS — BP 136/78 | HR 90 | Temp 97.4°F | Ht 63.0 in | Wt 207.4 lb

## 2022-03-14 DIAGNOSIS — K3184 Gastroparesis: Secondary | ICD-10-CM | POA: Diagnosis not present

## 2022-03-14 DIAGNOSIS — Z86010 Personal history of colonic polyps: Secondary | ICD-10-CM | POA: Diagnosis not present

## 2022-03-14 DIAGNOSIS — R194 Change in bowel habit: Secondary | ICD-10-CM | POA: Insufficient documentation

## 2022-03-14 DIAGNOSIS — K219 Gastro-esophageal reflux disease without esophagitis: Secondary | ICD-10-CM | POA: Diagnosis not present

## 2022-03-14 MED ORDER — ONDANSETRON HCL 8 MG PO TABS: ORAL_TABLET | ORAL | 2 refills | Status: DC

## 2022-03-14 MED ORDER — PANTOPRAZOLE SODIUM 40 MG PO TBEC: DELAYED_RELEASE_TABLET | ORAL | 3 refills | Status: DC

## 2022-03-14 NOTE — Patient Instructions (Addendum)
We will arrange for you to have a colonoscopy in the near future with Dr. Abbey Chatters. ? ?To help with your fluctuating bowel habits, start Benefiber 3 teaspoons daily x2 weeks, then increase to twice daily. ? ?Continue to use MiraLAX as needed for constipation. ? ?Continue Protonix 40 mg twice daily 30 minutes before breakfast and dinner. ? ?Continue Zofran as needed for nausea/vomiting.  As we discussed, you can go ahead and take 1 every morning to help prevent nausea if you would like. ? ?Gastroparesis diet recommendations:  ?4-6 small meals daily ?Low fat diet ?Low fiber diet (avoid raw fruits and vegetables). ? ?We will follow-up with you in the office after your colonoscopy.  Do not hesitate to call if you have any questions or concerns prior to your next visit. ? ?It was great to see you again today! ? ?Aliene Altes, PA-C ?[facility] ? ? ? ? ?

## 2022-03-15 ENCOUNTER — Telehealth: Payer: Self-pay | Admitting: *Deleted

## 2022-03-15 ENCOUNTER — Encounter: Payer: Self-pay | Admitting: *Deleted

## 2022-03-15 DIAGNOSIS — Z86010 Personal history of colonic polyps: Secondary | ICD-10-CM

## 2022-03-15 NOTE — Telephone Encounter (Signed)
Medication clearance faxed to Dr. Merlene Laughter office [phone] ?

## 2022-03-25 NOTE — Telephone Encounter (Signed)
Courtney, we need to follow-up on this. I have not received anything.  ?

## 2022-03-26 NOTE — Telephone Encounter (Addendum)
Left message on Raquel Sarna voicemail to call me back again.  ?

## 2022-04-02 NOTE — Telephone Encounter (Signed)
Finally spoke she someone at Dr. Merlene Laughter office and she stated that pt had not been seen in office in over a year.  Provider will not approve with out office visit. Also, needs referral from PCP.  Called pt and left a message.   ?

## 2022-04-02 NOTE — Telephone Encounter (Signed)
Please let patient know she is going to need to be seen at Dr. Freddie Apley office before they are willing to give the approval to hold Plavix. We can place a referral back to neurology.  ? ?[facility] Clinical Pool:  ?Please place referral to Dr. Freddie Apley office with ASAP priority.  ?Dx: History of stroke, needing clearance to hold Plavix x 5 days prior to colonoscopy.  ?

## 2022-04-02 NOTE — Telephone Encounter (Signed)
I have left several messages for someone to call me back. I have not received a phone call back.  ?

## 2022-04-03 ENCOUNTER — Telehealth: Payer: Self-pay | Admitting: *Deleted

## 2022-04-03 NOTE — Telephone Encounter (Signed)
Referral sent 

## 2022-04-03 NOTE — Telephone Encounter (Signed)
Dr. Wolfgang Phoenix can you approve holding medication  for pt's to have procedure or do I need to contact her neurologist.  ?  ?  ?We would like to request holding the following medication for patient please. ?  ?Procedure: Colonoscopy  ?  ?Date: TBD ?  ?Medication to hold: Plavix for 5 days  ?  ?Surgeon: Dr. Abbey Chatters  ?  ?Phone: [phone] ?  ?Fax:  [phone] ?  ?Type of Anesthesia:  Propofol  ASA III ?

## 2022-04-03 NOTE — Telephone Encounter (Signed)
Spoke to pt, she informed me that she has been getting the Plavix from [facility]. I sent him a message to see if he can approve it.  ?

## 2022-04-03 NOTE — Telephone Encounter (Signed)
Patient advised per Dr Nicki Reaper: that in order for her to have a colonoscopy she would have to be off the Plavix for 5 days.  There is a very small theoretic risk that she could have a stroke off of the Plavix but the likelihood of this would be very small.  So therefore in order to have a colonoscopy she will need to be off the Plavix for at least 5 days ahead of time.  Make sure that the patient is on board with this and if patient agrees to this.  ?Patient verbalized understanding ad agrees to stopping the Plavix for the colonoscopy as directed. ?

## 2022-04-03 NOTE — Telephone Encounter (Signed)
Clearance to hold Plavix has been given as stated below.  ?

## 2022-04-03 NOTE — Telephone Encounter (Signed)
Nurses-please let patient know that in order for her to have a colonoscopy she would have to be off the Plavix for 5 days.  There is a very small theoretic risk that she could have a stroke off of the Plavix but the likelihood of this would be very small.  So therefore in order to have a colonoscopy she will need to be off the Plavix for at least 5 days ahead of time.  Make sure that the patient is on board with this and if patient agrees to this please send message to [facility] Silversmith that it would be okay for her to be off of this ?

## 2022-04-03 NOTE — Addendum Note (Signed)
Addended by: Cheron Every on: 04/03/2022 09:06 AM ? ? Modules accepted: Orders ? ?

## 2022-04-04 NOTE — Telephone Encounter (Signed)
Tried to call pt, LMOVM for return call. 

## 2022-04-04 NOTE — Telephone Encounter (Signed)
Reviewed.  ? ?[facility] Clinical Pool:  ?Dr. Wolfgang Phoenix has given approval for patient to hold Plavix x 5 days prior to procedure. Please proceed with scheduling colonoscopy as planned.  ?

## 2022-04-05 ENCOUNTER — Telehealth: Payer: Self-pay

## 2022-04-05 NOTE — Telephone Encounter (Signed)
Opened in error

## 2022-04-05 NOTE — Telephone Encounter (Signed)
Tried to call pt, LMOVM for return call. 

## 2022-04-06 NOTE — Telephone Encounter (Signed)
Letter mailed

## 2022-04-09 NOTE — Telephone Encounter (Signed)
Noted  

## 2022-04-23 ENCOUNTER — Other Ambulatory Visit: Payer: Self-pay | Admitting: Nurse Practitioner

## 2022-04-25 ENCOUNTER — Ambulatory Visit (INDEPENDENT_AMBULATORY_CARE_PROVIDER_SITE_OTHER): Payer: PPO | Admitting: Family Medicine

## 2022-04-25 ENCOUNTER — Other Ambulatory Visit (HOSPITAL_COMMUNITY)
Admission: RE | Admit: 2022-04-25 | Discharge: 2022-04-25 | Disposition: A | Discharge status: Home / Self Care | Payer: HMO | Source: Ambulatory Visit | Attending: Family Medicine | Admitting: Family Medicine

## 2022-04-25 ENCOUNTER — Other Ambulatory Visit: Payer: Self-pay | Admitting: Family Medicine

## 2022-04-25 VITALS — BP 115/71 | HR 96 | Temp 97.2°F | Ht 63.0 in | Wt 197.0 lb

## 2022-04-25 DIAGNOSIS — E1159 Type 2 diabetes mellitus with other circulatory complications: Secondary | ICD-10-CM | POA: Insufficient documentation

## 2022-04-25 DIAGNOSIS — R42 Dizziness and giddiness: Secondary | ICD-10-CM

## 2022-04-25 DIAGNOSIS — G894 Chronic pain syndrome: Secondary | ICD-10-CM

## 2022-04-25 LAB — CBC
HCT: 40.9 % (ref 36.0–46.0)
Hemoglobin: 13.7 g/dL (ref 12.0–15.0)
MCH: 29 pg (ref 26.0–34.0)
MCHC: 33.5 g/dL (ref 30.0–36.0)
MCV: 86.5 fL (ref 80.0–100.0)
Platelets: 347 10*3/uL (ref 150–400)
RBC: 4.73 MIL/uL (ref 3.87–5.11)
RDW: 12.4 % (ref 11.5–15.5)
WBC: 8.6 10*3/uL (ref 4.0–10.5)
nRBC: 0 % (ref 0.0–0.2)

## 2022-04-25 LAB — COMPREHENSIVE METABOLIC PANEL
ALT: 14 U/L (ref 0–44)
AST: 19 U/L (ref 15–41)
Albumin: 4.2 g/dL (ref 3.5–5.0)
Alkaline Phosphatase: 54 U/L (ref 38–126)
Anion gap: 9 (ref 5–15)
BUN: 23 mg/dL (ref 8–23)
CO2: 23 mmol/L (ref 22–32)
Calcium: 10 mg/dL (ref 8.9–10.3)
Chloride: 104 mmol/L (ref 98–111)
Creatinine, Ser: 0.92 mg/dL (ref 0.44–1.00)
GFR, Estimated: >60 mL/min (ref >60)
Glucose, Bld: 218 mg/dL — ABNORMAL HIGH (ref 70–99)
Potassium: 4 mmol/L (ref 3.5–5.1)
Sodium: 136 mmol/L (ref 135–145)
Total Bilirubin: 0.4 mg/dL (ref 0.3–1.2)
Total Protein: 7.9 g/dL (ref 6.5–8.1)

## 2022-04-25 LAB — HEMOGLOBIN A1C
Hgb A1c MFr Bld: 8.6 % — ABNORMAL HIGH (ref 4.8–5.6)
Mean Plasma Glucose: 200.12 mg/dL

## 2022-04-25 MED ORDER — HYDROCODONE-ACETAMINOPHEN 7.5-325 MG PO TABS: 1.0000 | ORAL_TABLET | Freq: Four times a day (QID) | ORAL | 0 refills | Status: DC | PRN

## 2022-04-25 MED ORDER — AMLODIPINE BESYLATE 2.5 MG PO TABS: 2.5000 mg | ORAL_TABLET | Freq: Every day | ORAL | 1 refills | Status: DC

## 2022-04-25 NOTE — Patient Instructions (Addendum)
Labs today.  I have decreased your amlodipine.   Follow up in 2 weeks.   Dr. Lacinda Axon

## 2022-04-25 NOTE — Progress Notes (Signed)
Subjective:  Patient ID: Jamie Harding, female    DOB: 1956-08-31  Age: 66 y.o. MRN: [id number]  CC: Chief Complaint  Patient presents with   Dizziness    Currently on amoxicillin for recent dental procedure, reported passing out in bathroom on 6/4 but says didn't injure herself , has been feeling off balance and dizzy since     HPI:  66 year old female with an extensive PMH including stroke presents for evaluation of the above.  Patient reports that she recently had a dental procedure to remove her top teeth. Was doing okay per her report but was not eating very much due to the procedure.  She states that she was taking a shower on 6/4 and felt dizzy and faint. Got out of the shower and subsequently fell. Did not hit her head or get injured per her report. She states that she continues to feel off balance and dizzy. She had similar symptoms with prior CVA and she is concerned about this. She states she has been drinking fluids. No reports of vision changes or focal weakness.   Patient Active Problem List   Diagnosis Date Noted   Dizziness 04/25/2022   History of colonic polyps 03/14/2022   Long-term current use of opiate analgesic 07/07/2021   Gastroesophageal reflux disease 03/27/2021   Left foot drop 03/17/2020   Former smoker    Chronic cystitis with hematuria 01/22/2020   Nephrolithiasis 01/22/2020   Hyperlipidemia associated with type 2 diabetes mellitus ([place]) 12/08/2019   Morbid obesity due to excess calories ([place]) 09/05/2015   Chronic pain syndrome 03/30/2015   History of CVA (cerebrovascular accident) without residual deficits 12/23/2014   Dyspnea on exertion 11/29/2014   S/P carpal tunnel release 02/17/2013   Lumbar pain with radiation down left leg 02/16/2013   Chest tightness    Ejection fraction    Gastroparesis 07/19/2009   Type 2 diabetes mellitus with vascular disease ([place]) 07/15/2009   Hyperlipidemia 07/15/2009   ANXIETY 07/15/2009   Depression 07/15/2009    Essential hypertension 07/15/2009   HIATAL HERNIA 07/15/2009   Fatty liver 07/15/2009    Social Hx   Social History   Socioeconomic History   Marital status: Married    Spouse name: Not on file   Number of children: Not on file   Years of education: Not on file   Highest education level: Not on file  Occupational History   Occupation: nurse tech    Comment: [employer], 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)    Employer: [employer]  Tobacco Use   Smoking status: Former    Packs/day: 0.50    Years: 3.00    Pack years: 1.50    Types: Cigarettes    Quit date: 09/11/1987    Years since quitting: 34.6   Smokeless tobacco: Never  Substance and Sexual Activity   Alcohol use: No   Drug use: No   Sexual activity: Not Currently    Birth control/protection: Surgical  Other Topics Concern   Not on file  Social History Narrative   Not on file   Social Determinants of Health   Financial Resource Strain: Low Risk    Difficulty of Paying Living Expenses: Not hard at all  Food Insecurity: No Food Insecurity   Worried About Charity fundraiser in the Last Year: Never true   [place] in the Last Year: Never true  Transportation Needs: No Transportation Needs   Lack of Transportation (Medical): No  Lack of Transportation (Non-Medical): No  Physical Activity: Insufficiently Active   Days of Exercise per Week: 2 days   Minutes of Exercise per Session: 20 min  Stress: No Stress Concern Present   Feeling of Stress : Not at all  Social Connections: Moderately Isolated   Frequency of Communication with Friends and Family: Twice a week   Frequency of Social Gatherings with Friends and Family: More than three times a week   Attends Religious Services: Never   Marine scientist or Organizations: No   Attends Music therapist: Never   Marital Status: Married    Review of Systems Per HPI  Objective:  BP 115/71   Pulse 96   Temp (!) 97.2 F (36.2 C)   Ht 5'  3" (1.6 m)   Wt 197 lb (89.4 kg)   SpO2 97%   BMI 34.90 kg/m      04/25/2022   11:22 AM 03/14/2022    1:57 PM 02/07/2022    3:48 PM  BP/Weight  Systolic BP 858 850 277  Diastolic BP 71 78 79  Wt. (Lbs) 197 207.4 207  BMI 34.9 kg/m2 36.74 kg/m2 36.67 kg/m2   Physical Exam Constitutional:      General: She is not in acute distress.    Appearance: Normal appearance.  HENT:     Head: Normocephalic and atraumatic.  Eyes:     General:        Right eye: No discharge.        Left eye: No discharge.     Extraocular Movements: Extraocular movements intact.     Conjunctiva/sclera: Conjunctivae normal.     Pupils: Pupils are equal, round, and reactive to light.  Cardiovascular:     Rate and Rhythm: Normal rate and regular rhythm.  Pulmonary:     Effort: Pulmonary effort is normal.     Breath sounds: Normal breath sounds. No wheezing or rales.  Neurological:     General: No focal deficit present.     Mental Status: She is alert.     Cranial Nerves: No cranial nerve deficit.     Motor: No weakness.  Psychiatric:        Mood and Affect: Mood normal.        Behavior: Behavior normal.    Lab Results  Component Value Date   WBC 8.6 04/25/2022   HGB 13.7 04/25/2022   HCT 40.9 04/25/2022   PLT 347 04/25/2022   GLUCOSE 218 (H) 04/25/2022   CHOL 137 09/20/2021   TRIG 140 09/20/2021   HDL 45 (L) 09/20/2021   LDLCALC 70 09/20/2021   ALT 14 04/25/2022   AST 19 04/25/2022   NA 136 04/25/2022   K 4.0 04/25/2022   CL 104 04/25/2022   CREATININE 0.92 04/25/2022   BUN 23 04/25/2022   CO2 23 04/25/2022   TSH 0.59 05/29/2016   INR 1.0 03/17/2020   HGBA1C 8.6 (H) 04/25/2022   MICROALBUR 7.9 04/12/2021     Assessment & Plan:   Problem List Items Addressed This Visit       Cardiovascular and Mediastinum   Type 2 diabetes mellitus with vascular disease ([place])   Relevant Medications   amLODipine (NORVASC) 2.5 MG tablet   Other Relevant Orders   Comprehensive metabolic panel    Hemoglobin A1c     Other   Dizziness - Primary    Orthostatic today. STAT labs obtained today and were notable for uncontrolled diabetes with an A1C of 8.6.  Electrolytes, Renal function normal. CBC normal. No physical exam findings to suggest stroke. Decreasing Norvasc to 2.5 mg daily. Has upcoming follow up with PCP.       Relevant Orders   CBC    Meds ordered this encounter  Medications   amLODipine (NORVASC) 2.5 MG tablet    Sig: Take 1 tablet (2.5 mg total) by mouth daily.    Dispense:  90 tablet    Refill:  1    New dose   [place]

## 2022-04-25 NOTE — Assessment & Plan Note (Signed)
Orthostatic today. STAT labs obtained today and were notable for uncontrolled diabetes with an A1C of 8.6. Electrolytes, Renal function normal. CBC normal. No physical exam findings to suggest stroke. Decreasing Norvasc to 2.5 mg daily. Has upcoming follow up with PCP.

## 2022-04-26 ENCOUNTER — Other Ambulatory Visit: Payer: Self-pay | Admitting: Family Medicine

## 2022-04-26 ENCOUNTER — Telehealth: Payer: Self-pay | Admitting: *Deleted

## 2022-04-26 MED ORDER — HYDROCODONE-ACETAMINOPHEN 5-325 MG PO TABS: ORAL_TABLET | ORAL | 0 refills | Status: DC

## 2022-04-26 NOTE — Telephone Encounter (Signed)
Patient notified of provider's recommendations and verbalized understanding.  

## 2022-04-26 NOTE — Telephone Encounter (Signed)
Patient's monthly hydrocodone is due to be filled 05/01/22 and [facility] stated the earliest they can fill it is 04/29/22. Patient states that she had to take extra meds over the last month because she had some teeth pulled and the dentist was going to giver her ultram but cancelled the script when he saw her on hydrocodone so she had to take extra for the pain. Patient states her mouth is now healing well and she will not need the extra hydrocodone but needs a script of something to cover till she can get her script 04/29/22

## 2022-04-26 NOTE — Telephone Encounter (Signed)
Unfortunately guidelines are very strict that are put forth by the state medical board I was able to send them a lower strength hydrocodone that she could take every 6-8 hours as needed for pain only able to send in limited quantity this would need to last her until she gets her other prescription filled on the 11th

## 2022-05-02 ENCOUNTER — Telehealth: Payer: Self-pay | Admitting: Family Medicine

## 2022-05-02 NOTE — Telephone Encounter (Signed)
Left message for patient to call back and schedule Medicare Annual Wellness Visit (AWV) in office.   If unable to come into the office for AWV,  please offer to do virtually or by telephone.  Last AWV: 05/02/2021  Please schedule at anytime with [facility] Ad[facility].  30 minute appointment for Virtual or phone  45 minute appointment for in office or Initial virtual/phone  Any questions, please contact me at [phone]      

## 2022-05-07 ENCOUNTER — Ambulatory Visit (INDEPENDENT_AMBULATORY_CARE_PROVIDER_SITE_OTHER): Payer: HMO | Admitting: Family Medicine

## 2022-05-07 VITALS — BP 120/68 | HR 107 | Temp 96.9°F | Ht 63.0 in | Wt 203.2 lb

## 2022-05-07 DIAGNOSIS — Z1211 Encounter for screening for malignant neoplasm of colon: Secondary | ICD-10-CM | POA: Diagnosis not present

## 2022-05-07 DIAGNOSIS — Z1239 Encounter for other screening for malignant neoplasm of breast: Secondary | ICD-10-CM

## 2022-05-07 DIAGNOSIS — E1159 Type 2 diabetes mellitus with other circulatory complications: Secondary | ICD-10-CM

## 2022-05-07 DIAGNOSIS — Z79891 Long term (current) use of opiate analgesic: Secondary | ICD-10-CM | POA: Diagnosis not present

## 2022-05-07 DIAGNOSIS — E785 Hyperlipidemia, unspecified: Secondary | ICD-10-CM

## 2022-05-07 DIAGNOSIS — I10 Essential (primary) hypertension: Secondary | ICD-10-CM | POA: Diagnosis not present

## 2022-05-07 DIAGNOSIS — E1169 Type 2 diabetes mellitus with other specified complication: Secondary | ICD-10-CM

## 2022-05-07 DIAGNOSIS — G894 Chronic pain syndrome: Secondary | ICD-10-CM | POA: Diagnosis not present

## 2022-05-07 MED ORDER — CLOPIDOGREL BISULFATE 75 MG PO TABS: 75.0000 mg | ORAL_TABLET | Freq: Every day | ORAL | 3 refills | Status: DC

## 2022-05-07 MED ORDER — HYDROCODONE-ACETAMINOPHEN 7.5-325 MG PO TABS: 1.0000 | ORAL_TABLET | Freq: Four times a day (QID) | ORAL | 0 refills | Status: DC | PRN

## 2022-05-07 MED ORDER — LISINOPRIL 10 MG PO TABS: 10.0000 mg | ORAL_TABLET | Freq: Every day | ORAL | 1 refills | Status: DC

## 2022-05-07 MED ORDER — VENLAFAXINE HCL 75 MG PO TABS: ORAL_TABLET | ORAL | 1 refills | Status: DC

## 2022-05-07 MED ORDER — HYDROCODONE-ACETAMINOPHEN 7.5-325 MG PO TABS: ORAL_TABLET | ORAL | 0 refills | Status: DC

## 2022-05-07 MED ORDER — INSULIN DETEMIR 100 UNIT/ML ~~LOC~~ SOLN
SUBCUTANEOUS | 5 refills | Status: DC
Start: 2022-05-07 — End: 2024-05-07

## 2022-05-07 MED ORDER — PROMETHAZINE HCL 25 MG PO TABS: ORAL_TABLET | ORAL | 5 refills | Status: DC

## 2022-05-07 MED ORDER — ROSUVASTATIN CALCIUM 20 MG PO TABS: 20.0000 mg | ORAL_TABLET | Freq: Every day | ORAL | 1 refills | Status: DC

## 2022-05-07 MED ORDER — BUSPIRONE HCL 10 MG PO TABS
ORAL_TABLET | ORAL | 5 refills | Status: DC
Start: 2022-05-07 — End: 2022-11-08

## 2022-05-07 MED ORDER — METFORMIN HCL 1000 MG PO TABS
ORAL_TABLET | ORAL | 1 refills | Status: DC
Start: 2022-05-07 — End: 2022-11-08

## 2022-05-07 NOTE — Progress Notes (Signed)
Subjective:    Patient ID: Jamie Harding, female    DOB: 03-02-1956, 66 y.o.   MRN: [id number]  HPI  Patient here for 3 month follow up on pain management. This patient was seen today for chronic pain  The medication list was reviewed and updated.  Location of Pain for which the patient has been treated with regarding narcotics: Lumbar back pain with intermittent sciatica  Onset of this pain: Present for years   -Compliance with medication: Good compliance recently had to take more because of significant dental surgery  - Number patient states they take daily: Four  -when was the last dose patient took?  Earlier today  The patient was advised the importance of maintaining medication and not using illegal substances with these.  Here for refills and follow up  The patient was educated that we can provide 3 monthly scripts for their medication, it is their responsibility to follow the instructions.  Side effects or complications from medications: None  Patient is aware that pain medications are meant to minimize the severity of the pain to allow their pain levels to improve to allow for better function. They are aware of that pain medications cannot totally remove their pain.  Due for UDT ( at least once per year) : Next visit  Scale of 1 to 10 ( 1 is least 10 is most) Your pain level without the medicine: 8 Your pain level with medication 4  Scale 1 to 10 ( 1-helps very little, 10 helps very well) How well does your pain medication reduce your pain so you can function better through out the day? 7  Quality of the pain: Throbbing aching discomfort  Persistence of the pain: Present all the time  Modifying factors: Worse with activity  Type 2 diabetes mellitus with vascular disease ([place])  Essential hypertension  Hyperlipidemia associated with type 2 diabetes mellitus ([facility])  Long-term current use of opiate analgesic Reviewed over glucose readings A1c significantly  elevated patient tries to be relatively healthy with her eating habits but recently because of dental surgery taking in a lot more starches Takes her blood pressure medicines no more fainting spells Bowels are moving okay no hematochezia    Review of Systems     Objective:   Physical Exam  General-in no acute distress Eyes-no discharge Lungs-respiratory rate normal, CTA CV-no murmurs,RRR Extremities skin warm dry no edema Neuro grossly normal Behavior normal, alert       Assessment & Plan:  1. Type 2 diabetes mellitus with vascular disease ([place]) Diabetes poor control improved diet discussed portion control staying active were all discussed Continue Levemir 72 units twice daily 5 units with supper Recheck lab work mid-September follow-up 3 months  2. Essential hypertension Good readings continue current meds  3. Hyperlipidemia associated with type 2 diabetes mellitus ([place]) Recheck lipid profile before next visit Healthy diet continue medication 4. Long-term current use of opiate analgesic The patient was seen in followup for chronic pain. A review over at their current pain status was discussed. Drug registry was checked. Prescriptions were given.  Regular follow-up recommended. Discussion was held regarding the importance of compliance with medication as well as pain medication contract.  Patient was informed that medication may cause drowsiness and should not be combined  with other medications/alcohol or street drugs. If the patient feels medication is causing altered alertness then do not drive or operate dangerous equipment.  Should be noted that the patient appears to be meeting appropriate use  of opioids and response.  Evidenced by improved function and decent pain control without significant side effects and no evidence of overt aberrancy issues.  Upon discussion with the patient today they understand that opioid therapy is optional and they feel that the pain has  been refractory to reasonable conservative measures and is significant and affecting quality of life enough to warrant ongoing therapy and wishes to continue opioids.  Refills were provided.

## 2022-05-07 NOTE — Patient Instructions (Signed)

## 2022-05-24 ENCOUNTER — Other Ambulatory Visit: Payer: Self-pay | Admitting: Nurse Practitioner

## 2022-05-30 ENCOUNTER — Encounter: Payer: Self-pay | Admitting: *Deleted

## 2022-06-06 ENCOUNTER — Inpatient Hospital Stay (HOSPITAL_COMMUNITY): Admission: RE | Admit: 2022-06-06 | Payer: HMO | Source: Ambulatory Visit

## 2022-07-16 ENCOUNTER — Telehealth: Payer: Self-pay | Admitting: Orthopedic Surgery

## 2022-07-16 NOTE — Telephone Encounter (Signed)
Patient left message end of clinic today to request appointment for injection of thumbs with Dr Aline Brochure. Ph [phone]

## 2022-07-17 NOTE — Telephone Encounter (Signed)
Called patient back to offer appointment; reached voice mail, left message.

## 2022-07-17 NOTE — Telephone Encounter (Signed)
Patient aware of appointment

## 2022-07-26 ENCOUNTER — Ambulatory Visit (INDEPENDENT_AMBULATORY_CARE_PROVIDER_SITE_OTHER): Payer: HMO | Admitting: Orthopedic Surgery

## 2022-07-26 DIAGNOSIS — M65311 Trigger thumb, right thumb: Secondary | ICD-10-CM

## 2022-07-26 NOTE — Progress Notes (Signed)
Chief Complaint  Patient presents with   Hand Pain    R Thumb here for an injection   Jamie Harding has recurrent locking of her right thumb status post 1 injection.  She would like another  She indeed has a locked thumb  Tenderness over A1 pulley  Skin is clean dry and intact  Neurovascular exam is normal  Right Trigger thumb injection Medication  1 mL of 40 mg Depo-Medrol  2 mL of 1% lidocaine plain  Ethyl chloride for anesthesia  Verbal consent was obtained timeout was taken to confirm the injection site as right thumb  Alcohol was used to prepare the skin along with ethyl chloride and then the injection was made at the A1 pulley there were no complications

## 2022-07-26 NOTE — Patient Instructions (Signed)

## 2022-08-07 ENCOUNTER — Ambulatory Visit (INDEPENDENT_AMBULATORY_CARE_PROVIDER_SITE_OTHER): Payer: HMO | Admitting: Family Medicine

## 2022-08-07 VITALS — BP 134/74 | Temp 97.7°F | Ht 63.0 in | Wt 195.0 lb

## 2022-08-07 DIAGNOSIS — Z79891 Long term (current) use of opiate analgesic: Secondary | ICD-10-CM

## 2022-08-07 DIAGNOSIS — E1169 Type 2 diabetes mellitus with other specified complication: Secondary | ICD-10-CM | POA: Diagnosis not present

## 2022-08-07 DIAGNOSIS — Z23 Encounter for immunization: Secondary | ICD-10-CM | POA: Diagnosis not present

## 2022-08-07 DIAGNOSIS — E785 Hyperlipidemia, unspecified: Secondary | ICD-10-CM | POA: Diagnosis not present

## 2022-08-07 DIAGNOSIS — G894 Chronic pain syndrome: Secondary | ICD-10-CM | POA: Diagnosis not present

## 2022-08-07 DIAGNOSIS — E1159 Type 2 diabetes mellitus with other circulatory complications: Secondary | ICD-10-CM

## 2022-08-07 DIAGNOSIS — K3184 Gastroparesis: Secondary | ICD-10-CM

## 2022-08-07 DIAGNOSIS — I10 Essential (primary) hypertension: Secondary | ICD-10-CM

## 2022-08-07 DIAGNOSIS — Z1211 Encounter for screening for malignant neoplasm of colon: Secondary | ICD-10-CM | POA: Diagnosis not present

## 2022-08-07 MED ORDER — HYDROCODONE-ACETAMINOPHEN 7.5-325 MG PO TABS: ORAL_TABLET | ORAL | 0 refills | Status: DC

## 2022-08-07 MED ORDER — HYDROCODONE-ACETAMINOPHEN 7.5-325 MG PO TABS: 1.0000 | ORAL_TABLET | Freq: Four times a day (QID) | ORAL | 0 refills | Status: DC | PRN

## 2022-08-07 MED ORDER — AMLODIPINE BESYLATE 2.5 MG PO TABS: 2.5000 mg | ORAL_TABLET | Freq: Every day | ORAL | 1 refills | Status: DC

## 2022-08-07 NOTE — Progress Notes (Signed)
Subjective:    Patient ID: Jamie Harding, female    DOB: [birth year], 66 y.o.   MRN: [id number]  HPI Patient for blood pressure check up.  The patient does have hypertension.   Patient relates dietary measures try to minimize salt The importance of healthy diet and activity were discussed Patient relates compliance  Patient here for follow-up regarding cholesterol.    Patient relates taking medication on a regular basis Denies problems with medication Importance of dietary measures discussed Regular lab work regarding lipid and liver was checked and if needing additional labs was appropriately ordered  The patient was seen today as part of a comprehensive diabetic check up. Patient has diabetes Patient relates good compliance with taking the medication. We discussed their diet and exercise activities  We also discussed the importance of notifying us if any excessively high glucoses or low sugars.     This patient was seen today for chronic pain  The medication list was reviewed and updated.  Location of Pain for which the patient has been treated with regarding narcotics: Patient with ongoing chronic low back pain with intermittent sciatica also has painful neuropathy of her feet.  MRI in 2014  Onset of this pain: Present for well over 10 years   -Compliance with medication: Good compliance with medicine  - Number patient states they take daily: Takes 4/day  -when was the last dose patient took?  Earlier today  The patient was advised the importance of maintaining medication and not using illegal substances with these.  Here for refills and follow up  The patient was educated that we can provide 3 monthly scripts for their medication, it is their responsibility to follow the instructions.  Side effects or complications from medications: Denies side effects  Patient is aware that pain medications are meant to minimize the severity of the pain to allow their pain  levels to improve to allow for better function. They are aware of that pain medications cannot totally remove their pain.  Due for UDT ( at least once per year) : Due on next visit  Scale of 1 to 10 ( 1 is least 10 is most) Your pain level without the medicine: 7 Your pain level with medication 3  Scale 1 to 10 ( 1-helps very little, 10 helps very well) How well does your pain medication reduce your pain so you can function better through out the day?  8  Quality of the pain: Throbbing aching pain  Persistence of the pain: Present all the time  Modifying factors: Worse with activity        Review of Systems     Objective:   Physical Exam  General-in no acute distress Eyes-no discharge Lungs-respiratory rate normal, CTA CV-no murmurs,RRR Extremities skin warm dry no edema Neuro grossly normal Behavior normal, alert       Assessment & Plan:   1. Type 2 diabetes mellitus with vascular disease ([place]) The patient was seen today as part of a comprehensive visit for diabetes. The importance of keeping her A1c at or below 7 range was discussed.  Discussed diet, activity, and medication compliance Emphasized healthy eating primarily with vegetables fruits and if utilizing meats lean meats such as chicken or fish grilled baked broiled Avoid sugary drinks Minimize and avoid processed foods Fit in regular physical activity preferably 25 to 30 minutes 4 times per week Standard follow-up visit recommended.  Patient aware lack of control and follow-up increases risk of diabetic complications. Regular  follow-up visits Yearly ophthalmology Yearly foot exam   2. Essential hypertension HTN- patient seen for follow-up regarding HTN.   Diet, medication compliance, appropriate labs and refills were completed.   Importance of keeping blood pressure under good control to lessen the risk of complications discussed Regular follow-up visits discussed   3. Gastroparesis Patient tries  eat small meals frequently  4. Hyperlipidemia associated with type 2 diabetes mellitus ([facility]) Hyperlipidemia-importance of diet, weight control, activity, compliance with medications discussed.   Recent labs reviewed.   Any additional labs or refills ordered.   Importance of keeping under good control discussed. Regular follow-up visits discussed   5. Long-term current use of opiate analgesic The patient was seen in followup for chronic pain. A review over at their current pain status was discussed. Drug registry was checked. Prescriptions were given.  Regular follow-up recommended. Discussion was held regarding the importance of compliance with medication as well as pain medication contract.  Patient was informed that medication may cause drowsiness and should not be combined  with other medications/alcohol or street drugs. If the patient feels medication is causing altered alertness then do not drive or operate dangerous equipment.  Should be noted that the patient appears to be meeting appropriate use of opioids and response.  Evidenced by improved function and decent pain control without significant side effects and no evidence of overt aberrancy issues.  Upon discussion with the patient today they understand that opioid therapy is optional and they feel that the pain has been refractory to reasonable conservative measures and is significant and affecting quality of life enough to warrant ongoing therapy and wishes to continue opioids.  Refills were provided.   6. Chronic pain syndrome Please see above U DT today

## 2022-08-07 NOTE — Patient Instructions (Signed)
Hi Jamie Harding  It was good to see you today. Please do the best you can with your long-acting insulin utilizing it twice per day whereas the short acting only with meals If you have frequent low sugar spells or high sugar spells please let us know Please follow-up in 3 months Please do your lab work within the next week Refills of your pain medicine was sent in Should you have any trouble let us know Thanks-Dr. Nicki Reaper

## 2022-08-14 ENCOUNTER — Encounter: Payer: Self-pay | Admitting: *Deleted

## 2022-08-20 ENCOUNTER — Other Ambulatory Visit: Payer: Self-pay | Admitting: Family Medicine

## 2022-08-22 DIAGNOSIS — I10 Essential (primary) hypertension: Secondary | ICD-10-CM | POA: Diagnosis not present

## 2022-08-22 DIAGNOSIS — E1159 Type 2 diabetes mellitus with other circulatory complications: Secondary | ICD-10-CM | POA: Diagnosis not present

## 2022-08-23 LAB — BASIC METABOLIC PANEL
BUN/Creatinine Ratio: 19 (calc) (ref 6–22)
BUN: 23 mg/dL (ref 7–25)
CO2: 28 mmol/L (ref 20–32)
Calcium: 9.5 mg/dL (ref 8.6–10.4)
Chloride: 101 mmol/L (ref 98–110)
Creat: 1.2 mg/dL — ABNORMAL HIGH (ref 0.50–1.05)
Glucose, Bld: 192 mg/dL — ABNORMAL HIGH (ref 65–99)
Potassium: 4.4 mmol/L (ref 3.5–5.3)
Sodium: 138 mmol/L (ref 135–146)

## 2022-08-23 LAB — HEMOGLOBIN A1C
Hgb A1c MFr Bld: 8.1 % of total Hgb — ABNORMAL HIGH (ref <5.7)
Mean Plasma Glucose: 186 mg/dL
eAG (mmol/L): 10.3 mmol/L

## 2022-08-27 NOTE — Addendum Note (Signed)
Addended by: Dairl Ponder on: 08/27/2022 11:45 AM   Modules accepted: Orders

## 2022-08-28 ENCOUNTER — Other Ambulatory Visit: Payer: Self-pay | Admitting: Family Medicine

## 2022-09-02 NOTE — Progress Notes (Unsigned)
Referring Provider: Kathyrn Drown, MD Primary Care Physician:  Kathyrn Drown, MD Primary GI Physician: Dr. Abbey Chatters  Chief Complaint  Patient presents with   Colonoscopy    Colonoscopy screening. Diarrhea all the time off and on. Cramping bad in the bottom of stomach     HPI:   Jamie Harding is a 66 y.o. female with history of stroke, diabetes, rheumatoid arthritis, anxiety/depression, HTN, HLD, GERD, chronic nausea with vomiting, gastroparesis (abnormal GES November 2021), constipation, hyperplastic colon/rectal polyps currently due for colonoscopy, presenting today for follow-up and to discuss scheduling colonoscopy.   Last seen in our office 03/14/2022.  GERD well controlled on Protonix 40 mg twice daily.  Gastroparesis symptoms fairly well controlled with PPI twice daily and Zofran as needed.  Had some room for improvement in her diet which was discussed extensively.  No need for Reglan at that time.  No change in bowel habits, having intermittent loose stools with associated abdominal cramping prior to bowel movements that improves thereafter.  Overall, suspect the patient was primarily on the constipated side with some overflow diarrhea as her bowels are moving only every 3 to 4 days.  No alarm symptoms.  She was taking MiraLAX once weekly and stated if taking more regularly, she would have diarrhea.  She was advised to continue her current medications, start Benefiber daily, and we had planned to proceed with a colonoscopy.   We were reaching out to patient's neurologist to get the approval to hold Plavix prior to colonoscopy.  As she had not been seen by Dr. Merlene Laughter in over a year, they required office visit.  Patient stated Dr. Wolfgang Phoenix had been managing her Plavix.  We reach out to him and he gave approval to hold Plavix.  We tried reaching patient to schedule colonoscopy, but could not reach her.   Today:  Continues with alternating constipation and diarrhea.  Associated  intermittent lower abdominal cramping that usually improves after bowel movement.  States she will skip up to a week without a bowel movement then have 3 or 4 days with 4-5 bowel movements per day.  Stools will start out as mushy, then turned to watery.  Occasional nocturnal stools.  She had some accidents intermittently.  Currently taking Metamucil Gummies.  No longer taking MiraLAX.  She will take milk of magnesia if she has not had a bowel movement in a while or has abdominal pain.  Foul smell to BMs.  Can improve with a BM. No brbpr or melena.   GERD continues to be well controlled on pantoprazole 40 mg twice daily.  Sometimes wakes with nausea, but Zofran helps significantly.  Has not vomited in a long time.  Overall, satisfied with the control of her gastroparesis and she is not interested in adding any new medications.  She eats 1 or 2 meals a day. Down about 10 lbs in the last 6 months. Not necessarily trying, but she does eat less as she doesn't get hungry often.    Last colonoscopy in 2013 with hyperplastic sigmoid and rectal polyps, mild pancolonic diverticulosis, small internal hemorrhoids.  Recommended 10-year repeat.   Past Medical History:  Diagnosis Date   Anxiety    Asthma    Depression    Essential hypertension    Fatty liver    Gastritis    Gastroparesis    GERD (gastroesophageal reflux disease)    Hiatal hernia    History of cardiac catheterization    Minimal coronary atherosclerosis  February 2016   History of kidney stones    History of stroke 2008   HLD (hyperlipidemia)    Neuropathy    Obesity    Pancreatitis    Rheumatoid arthritis(714.0)    Stroke ([place]) 2008   Stroke [facility]) 01/2020   Tibialis tendinitis    Type 2 diabetes mellitus ([place])     Past Surgical History:  Procedure Laterality Date   ANKLE SURGERY     remove extra bone-left   BIOPSY  04/25/2021   Procedure: BIOPSY;  Surgeon: Eloise Harman, DO;  [facility];  Service: Endoscopy;;    CARPAL TUNNEL RELEASE     right side   CARPAL TUNNEL RELEASE Left 02/06/2013   Procedure: CARPAL TUNNEL RELEASE ;  Surgeon: Carole Civil, MD;  Location: [facility];  Service: Orthopedics;  Laterality: Left;  procedure end [place] Right 09/14/2015   Procedure: RIGHT CARPAL TUNNEL RELEASE;  Surgeon: Carole Civil, MD;  Location: [facility];  Service: Orthopedics;  Laterality: Right;   CARPAL TUNNEL RELEASE Left 09/28/2015   Procedure: LEFT CARPAL TUNNEL RELEASE;  Surgeon: Carole Civil, MD;  Location: [facility];  Service: Orthopedics;  Laterality: Left;  procedure 1   CESAREAN SECTION     CHOLECYSTECTOMY     COLONOSCOPY WITH PROPOFOL  09/16/2012   NLG:XQJJ sessile polyps ranging between 3-36m in size were found in the sigmoid colon and rectum (hyperplastic)/Mild diverticulosis was noted throughout the entire examined colon/Small internal hemorrhoids. Repeat in 10 years.   DILATATION & CURETTAGE/HYSTEROSCOPY WITH MYOSURE N/A 08/05/2019   Procedure: DILATATION & CURETTAGE/HYSTEROSCOPY WITH MYOSURE;  Surgeon: DSloan Leiter MD;  [facility]  Service: Gynecology;  Laterality: N/A;  myosure rep will be here.  Confirmed on 07/30/19 CS   DILATION AND CURETTAGE OF UTERUS     multiple   ESOPHAGOGASTRODUODENOSCOPY  07/29/2009   Mild gastritis, benign path   ESOPHAGOGASTRODUODENOSCOPY (EGD) WITH PROPOFOL  09/16/2012   SLF:Non-erosive gastritis (inflammation) was found; multiple bx/The duodenal mucosa showed no abnormalities in the ampulla and bulb and second portion of the duodenum/NAUSEA/VOMITING MOST LIKELY DUE TO GERD/GASTRITIS   ESOPHAGOGASTRODUODENOSCOPY (EGD) WITH PROPOFOL N/A 04/25/2021   Surgeon: CEloise Harman DO; Gastritis biopsied, normal examined duodenum biopsied.  Pathology revealed focal chronic gastritis, negative for H. Pylori, benign small bowel mucosa.   FINGER SURGERY Left    left little finger-otif of finger   Gastric Emptying   07/27/2009   The amount of activity in the stomach at 120 minutes was 13% which is in the normal range   LASER ABLATION OF THE CERVIX     LEFT AND RIGHT HEART CATHETERIZATION WITH CORONARY ANGIOGRAM N/A 01/03/2015   Procedure: LEFT AND RIGHT HEART CATHETERIZATION WITH CORONARY ANGIOGRAM;  Surgeon: MBlane Ohara MD;  [facility];  Service: Cardiovascular;  Laterality: N/A;   LITHOTRIPSY     POLYPECTOMY  09/16/2012   Procedure: POLYPECTOMY;  Surgeon: SDanie Binder MD;  Location: [facility];  Service: Endoscopy;  Laterality: N/A;   SAVORY DILATION  09/16/2012   Procedure: SAVORY DILATION;  Surgeon: SDanie Binder MD;  Location: [facility];  Service: Endoscopy;  Laterality: N/A;  16 fr dilation   TRIGGER FINGER RELEASE Left 02/06/2013   Procedure: RELEASE TRIGGER FINGER LEFT LONG FINGER/A-1 PULLEY;  Surgeon: SCarole Civil MD;  Location: [facility];  Service: Orthopedics;  Laterality: Left;  procedure [place] 09[place]  Procedure: RIGHT LONG FINGER TRIGGER RELEASE;  Surgeon: Carole Civil, MD;  Location: [facility];  Service: Orthopedics;  Laterality: Right;   TRIGGER FINGER RELEASE Left 09/28/2015   Procedure: LEFT RING TRIGGER FINGER RELEASE;  Surgeon: Carole Civil, MD;  Location: [facility];  Service: Orthopedics;  Laterality: Left;  left ring finger   TUBAL LIGATION      Current Outpatient Medications  Medication Sig Dispense Refill   albuterol (VENTOLIN HFA) 108 (90 Base) MCG/ACT inhaler INHALE 2 PUFFS INTO THE LUNGS EVERY 4 (FOUR) HOURS AS NEEDED FOR WHEEZING. 54 g 5   amLODipine (NORVASC) 2.5 MG tablet Take 1 tablet (2.5 mg total) by mouth daily. 90 tablet 1   blood glucose meter kit and supplies Dispense based on patient and insurance preference. Pt checking sugars four times a day . (FOR ICD-10 E10.9, E11.9). 1 each 0   busPIRone (BUSPAR) 10 MG tablet Take 1 tablet BID PRN 60 tablet 5   cetirizine (ZYRTEC) 10 MG tablet TAKE ONE TABLET BY  MOUTH ONCE DAILY. 90 tablet 0   clopidogrel (PLAVIX) 75 MG tablet Take 1 tablet (75 mg total) by mouth daily with breakfast. 90 tablet 3   dicyclomine (BENTYL) 10 MG capsule Take 1 capsule (10 mg total) by mouth 3 (three) times daily as needed (for abdominal cramping or diarrhea). 90 capsule 0   fluticasone (FLONASE) 50 MCG/ACT nasal spray Place 2 sprays into both nostrils daily. 48 g 3   glucose blood (ONETOUCH VERIO) test strip USE AS DIRECTED TO TEST BLOOD GLUCOSE 4 TIMES DAILY. DX E 11.9 200 strip 0   HYDROcodone-acetaminophen (NORCO) 7.5-325 MG tablet Take 1 tablet by mouth every 6 (six) hours as needed for moderate pain. 120 tablet 0   HYDROcodone-acetaminophen (NORCO) 7.5-325 MG tablet Take one tablet po every 6 hrs prn moderate pain 120 tablet 0   HYDROcodone-acetaminophen (NORCO) 7.5-325 MG tablet Take 1 tablet by mouth every 6 hours as needed for moderate pain 120 tablet 0   insulin detemir (LEVEMIR) 100 UNIT/ML injection Inject 72 units in the skin in the morning and 72 units into skin qhs- may titrate to 85 units BID 15 mL 5   lisinopril (ZESTRIL) 10 MG tablet Take 1 tablet (10 mg total) by mouth daily. 90 tablet 1   metFORMIN (GLUCOPHAGE) 1000 MG tablet TAKE ONE TABLET BY MOUTH TWO TIMES A DAY. 180 tablet 1   Multiple Vitamin (MULTIVITAMIN WITH MINERALS) TABS Take 1 tablet by mouth daily.     NOVOLOG 100 UNIT/ML injection Inject 10 units into skin with breakfast and 10 units into skin at supper 10 mL 5   ondansetron (ZOFRAN) 8 MG tablet Take one tablet by mouth every 8 hours as needed for nausea/vomiting. 60 tablet 2   pantoprazole (PROTONIX) 40 MG tablet TAKE (1) TABLET BY MOUTH TWICE A DAY BEFORE A MEAL. 180 tablet 3   promethazine (PHENERGAN) 25 MG tablet TAKE ONE TABLET BY MOUTH EVERY EIGHT HOURS AS NEEDED FOR NAUSEA 30 tablet 5   rosuvastatin (CRESTOR) 20 MG tablet Take 1 tablet (20 mg total) by mouth daily. 90 tablet 1   trimethoprim (TRIMPEX) 100 MG tablet Take 1 tablet (100 mg  total) by mouth daily. 90 tablet 3   venlafaxine (EFFEXOR) 75 MG tablet TAKE (1) TABLET BY MOUTH 3 TIMES DAILY 270 tablet 0   No current facility-administered medications for this visit.    Allergies as of 09/03/2022 - Review Complete 09/03/2022  Allergen Reaction Noted   [place]  10 mcg pen [exenatide] Diarrhea and Nausea And Vomiting 02/02/2013   Naproxen Nausea And Vomiting 07/15/2009   Pollen extract  05/05/2020   Augmentin [amoxicillin-pot clavulanate]  03/16/2014   Azithromycin Nausea And Vomiting and Rash 07/15/2009   Erythromycin Hives 02/02/2013   Invokana [canagliflozin]  09/13/2014   Morphine Hives and Rash    Sulfonamide derivatives Nausea And Vomiting and Rash     Family History  Problem Relation Age of Onset   Breast cancer Mother    Diabetes Father    Coronary artery disease Other    Arthritis Other    Asthma Other    Diabetes Sister    Colon cancer Neg Hx     Social History   Socioeconomic History   Marital status: Married    Spouse name: Not on file   Number of children: Not on file   Years of education: Not on file   Highest education level: Not on file  Occupational History   Occupation: nurse tech    Comment: [employer], 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)    Employer: [employer]  Tobacco Use   Smoking status: Former    Packs/day: 0.50    Years: 3.00    Total pack years: 1.50    Types: Cigarettes    Quit date: 09/11/1987    Years since quitting: 35.0   Smokeless tobacco: Never  Substance and Sexual Activity   Alcohol use: No   Drug use: No   Sexual activity: Not Currently    Birth control/protection: Surgical  Other Topics Concern   Not on file  Social History Narrative   Not on file   Social Determinants of Health   Financial Resource Strain: Low Risk  (05/02/2021)   Overall Financial Resource Strain (CARDIA)    Difficulty of Paying Living Expenses: Not hard at all  Food Insecurity: No Food Insecurity (05/02/2021)   Hunger Vital Sign     Worried About Running Out of Food in the Last Year: Never true    Ran Out of Food in the Last Year: Never true  Transportation Needs: No Transportation Needs (05/02/2021)   PRAPARE - Hydrologist (Medical): No    Lack of Transportation (Non-Medical): No  Physical Activity: Insufficiently Active (05/02/2021)   Exercise Vital Sign    Days of Exercise per Week: 2 days    Minutes of Exercise per Session: 20 min  Stress: No Stress Concern Present (05/02/2021)   [place]    Feeling of Stress : Not at all  Social Connections: Moderately Isolated (05/02/2021)   Social Connection and Isolation Panel [NHANES]    Frequency of Communication with Friends and Family: Twice a week    Frequency of Social Gatherings with Friends and Family: More than three times a week    Attends Religious Services: Never    Marine scientist or Organizations: No    Attends Music therapist: Never    Marital Status: Married    Review of Systems: Gen: Denies fever, chills, cold or flulike symptoms, presyncope, syncope. CV: Denies chest pain, palpitations. Resp: Denies dyspnea, cough.  GI: See HPI Heme: See HPI  Physical Exam: BP (!) 145/82 (BP Location: Right Arm, Patient Position: Sitting, Cuff Size: Normal)   Pulse 87   Temp 97.9 F (36.6 C) (Temporal)   Ht $R'5\' 2"'MG$  (1.575 m)   Wt 196 lb 9.6 oz (89.2 kg)   SpO2 100%  BMI 35.96 kg/m  General:   Alert and oriented. No distress noted. Pleasant and cooperative.  Head:  Normocephalic and atraumatic. Eyes:  Conjuctiva clear without scleral icterus. Heart:  S1, S2 present without murmurs appreciated. Lungs:  Clear to auscultation bilaterally. No wheezes, rales, or rhonchi. No distress.  Abdomen:  +BS, soft, non-tender and non-distended. No rebound or guarding. No HSM or masses noted. Msk:  Symmetrical without gross deformities. Normal  posture. Extremities:  Without edema. Neurologic:  Alert and  oriented x4 Psych:  Normal mood and affect.    Assessment:  66 y.o. female with history of stroke, diabetes, rheumatoid arthritis, anxiety/depression, HTN, HLD, GERD, chronic nausea with vomiting, gastroparesis (abnormal GES November 2021), constipation, hyperplastic colon/rectal polyps currently due for colonoscopy, presenting today for follow-up and to discuss scheduling colonoscopy.   GERD: Well-controlled on Protonix 40 mg twice daily.  Gastroparesis: Symptoms fairly well controlled on PPI twice daily and Zofran as needed.  Intermittent nausea, but no vomiting.  I do note that she lost about 10 pounds over the last 6 months which has been unintentional. I suspect this is secondary to decreased oral intake in general as she reports eating 1 or 2 meals a day due to not getting hungry.  Discussed Reglan, but patient is not interested in adding any new medications.  Advised to eat 4-6 small meals a day and if not interested in having a meal, she could try protein shakes as meal replacements to ensure she is having adequate nutrition.  Alternating constipation and diarrhea:  Change in bowel habits from chronic constipation to alternating constipation and diarrhea in December 2022.  Previously denied medication changes, dietary changes, stress changes prior to change in bowel habits.  Associated lower abdominal cramping intermittently that improves with a bowel movement.  No BRBPR or melena.  She has lost about 10 pounds over the last 6 months unintentionally, but suspect this is likely more related to gastroparesis and decreased oral intake as discussed above.  Overall, suspect alternating bowel habits is likely secondary to IBS-C with overflow diarrhea, but unable to rule out malignancy.  We will treat supportively and proceed with a colonoscopy as she is due for screening at this time.   History of colon polyps: Due for screening  colonoscopy. Last colonoscopy in 2013 with hyperplastic sigmoid and rectal polyps, mild pancolonic diverticulosis, small internal hemorrhoids.  Recommended 10-year repeat.     Plan:  Proceed with colonoscopy with propofol by Dr. Abbey Chatters in near future. The risks, benefits, and alternatives have been discussed with the patient in detail. The patient states understanding and desires to proceed.  ASA 3 Hold Plavix x5 days prior to procedure. 1 day prior to procedure: One half dose of Levemir in the morning and evening, mealtime insulin as needed, one half dose of metformin morning and evening. Day of procedure: No morning diabetes medications. Start MiraLAX 17 g daily.  Counseled that patient may experience some loose stools/diarrhea for several days, but I suspect this will taper off. Continue Metamucil Gummies. Use dicyclomine 10 mg up to 3 times a day as needed for abdominal cramping, hold in the setting of constipation. Continue pantoprazole 40 mg twice daily. Continue Zofran as needed. Recommended 4-6 small meals daily, low-fat/low fiber diet.  Can try adding protein shakes as meal replacements if not very hungry in efforts to supplement nutritional intake.   Follow-up after colonoscopy.    Aliene Altes, PA-C [facility] 09/03/2022

## 2022-09-02 NOTE — H&P (View-Only) (Signed)
Referring Provider: Kathyrn Drown, MD Primary Care Physician:  Kathyrn Drown, MD Primary GI Physician: Dr. Abbey Chatters  Chief Complaint  Patient presents with   Colonoscopy    Colonoscopy screening. Diarrhea all the time off and on. Cramping bad in the bottom of stomach     HPI:   Jamie Harding is a 66 y.o. female with history of stroke, diabetes, rheumatoid arthritis, anxiety/depression, HTN, HLD, GERD, chronic nausea with vomiting, gastroparesis (abnormal GES November 2021), constipation, hyperplastic colon/rectal polyps currently due for colonoscopy, presenting today for follow-up and to discuss scheduling colonoscopy.   Last seen in our office 03/14/2022.  GERD well controlled on Protonix 40 mg twice daily.  Gastroparesis symptoms fairly well controlled with PPI twice daily and Zofran as needed.  Had some room for improvement in her diet which was discussed extensively.  No need for Reglan at that time.  No change in bowel habits, having intermittent loose stools with associated abdominal cramping prior to bowel movements that improves thereafter.  Overall, suspect the patient was primarily on the constipated side with some overflow diarrhea as her bowels are moving only every 3 to 4 days.  No alarm symptoms.  She was taking MiraLAX once weekly and stated if taking more regularly, she would have diarrhea.  She was advised to continue her current medications, start Benefiber daily, and we had planned to proceed with a colonoscopy.   We were reaching out to patient's neurologist to get the approval to hold Plavix prior to colonoscopy.  As she had not been seen by Dr. Merlene Laughter in over a year, they required office visit.  Patient stated Dr. Wolfgang Phoenix had been managing her Plavix.  We reach out to him and he gave approval to hold Plavix.  We tried reaching patient to schedule colonoscopy, but could not reach her.   Today:  Continues with alternating constipation and diarrhea.  Associated  intermittent lower abdominal cramping that usually improves after bowel movement.  States she will skip up to a week without a bowel movement then have 3 or 4 days with 4-5 bowel movements per day.  Stools will start out as mushy, then turned to watery.  Occasional nocturnal stools.  She had some accidents intermittently.  Currently taking Metamucil Gummies.  No longer taking MiraLAX.  She will take milk of magnesia if she has not had a bowel movement in a while or has abdominal pain.  Foul smell to BMs.  Can improve with a BM. No brbpr or melena.   GERD continues to be well controlled on pantoprazole 40 mg twice daily.  Sometimes wakes with nausea, but Zofran helps significantly.  Has not vomited in a long time.  Overall, satisfied with the control of her gastroparesis and she is not interested in adding any new medications.  She eats 1 or 2 meals a day. Down about 10 lbs in the last 6 months. Not necessarily trying, but she does eat less as she doesn't get hungry often.    Last colonoscopy in 2013 with hyperplastic sigmoid and rectal polyps, mild pancolonic diverticulosis, small internal hemorrhoids.  Recommended 10-year repeat.   Past Medical History:  Diagnosis Date   Anxiety    Asthma    Depression    Essential hypertension    Fatty liver    Gastritis    Gastroparesis    GERD (gastroesophageal reflux disease)    Hiatal hernia    History of cardiac catheterization    Minimal coronary atherosclerosis  February 2016   History of kidney stones    History of stroke 2008   HLD (hyperlipidemia)    Neuropathy    Obesity    Pancreatitis    Rheumatoid arthritis(714.0)    Stroke ([place]) 2008   Stroke [facility]) 01/2020   Tibialis tendinitis    Type 2 diabetes mellitus ([place])     Past Surgical History:  Procedure Laterality Date   ANKLE SURGERY     remove extra bone-left   BIOPSY  04/25/2021   Procedure: BIOPSY;  Surgeon: Eloise Harman, DO;  [facility];  Service: Endoscopy;;    CARPAL TUNNEL RELEASE     right side   CARPAL TUNNEL RELEASE Left 02/06/2013   Procedure: CARPAL TUNNEL RELEASE ;  Surgeon: Carole Civil, MD;  Location: [facility];  Service: Orthopedics;  Laterality: Left;  procedure end [place] Right 09/14/2015   Procedure: RIGHT CARPAL TUNNEL RELEASE;  Surgeon: Carole Civil, MD;  Location: [facility];  Service: Orthopedics;  Laterality: Right;   CARPAL TUNNEL RELEASE Left 09/28/2015   Procedure: LEFT CARPAL TUNNEL RELEASE;  Surgeon: Carole Civil, MD;  Location: [facility];  Service: Orthopedics;  Laterality: Left;  procedure 1   CESAREAN SECTION     CHOLECYSTECTOMY     COLONOSCOPY WITH PROPOFOL  09/16/2012   NLG:XQJJ sessile polyps ranging between 3-36m in size were found in the sigmoid colon and rectum (hyperplastic)/Mild diverticulosis was noted throughout the entire examined colon/Small internal hemorrhoids. Repeat in 10 years.   DILATATION & CURETTAGE/HYSTEROSCOPY WITH MYOSURE N/A 08/05/2019   Procedure: DILATATION & CURETTAGE/HYSTEROSCOPY WITH MYOSURE;  Surgeon: DSloan Leiter MD;  [facility]  Service: Gynecology;  Laterality: N/A;  myosure rep will be here.  Confirmed on 07/30/19 CS   DILATION AND CURETTAGE OF UTERUS     multiple   ESOPHAGOGASTRODUODENOSCOPY  07/29/2009   Mild gastritis, benign path   ESOPHAGOGASTRODUODENOSCOPY (EGD) WITH PROPOFOL  09/16/2012   SLF:Non-erosive gastritis (inflammation) was found; multiple bx/The duodenal mucosa showed no abnormalities in the ampulla and bulb and second portion of the duodenum/NAUSEA/VOMITING MOST LIKELY DUE TO GERD/GASTRITIS   ESOPHAGOGASTRODUODENOSCOPY (EGD) WITH PROPOFOL N/A 04/25/2021   Surgeon: CEloise Harman DO; Gastritis biopsied, normal examined duodenum biopsied.  Pathology revealed focal chronic gastritis, negative for H. Pylori, benign small bowel mucosa.   FINGER SURGERY Left    left little finger-otif of finger   Gastric Emptying   07/27/2009   The amount of activity in the stomach at 120 minutes was 13% which is in the normal range   LASER ABLATION OF THE CERVIX     LEFT AND RIGHT HEART CATHETERIZATION WITH CORONARY ANGIOGRAM N/A 01/03/2015   Procedure: LEFT AND RIGHT HEART CATHETERIZATION WITH CORONARY ANGIOGRAM;  Surgeon: MBlane Ohara MD;  [facility];  Service: Cardiovascular;  Laterality: N/A;   LITHOTRIPSY     POLYPECTOMY  09/16/2012   Procedure: POLYPECTOMY;  Surgeon: SDanie Binder MD;  Location: [facility];  Service: Endoscopy;  Laterality: N/A;   SAVORY DILATION  09/16/2012   Procedure: SAVORY DILATION;  Surgeon: SDanie Binder MD;  Location: [facility];  Service: Endoscopy;  Laterality: N/A;  16 fr dilation   TRIGGER FINGER RELEASE Left 02/06/2013   Procedure: RELEASE TRIGGER FINGER LEFT LONG FINGER/A-1 PULLEY;  Surgeon: SCarole Civil MD;  Location: [facility];  Service: Orthopedics;  Laterality: Left;  procedure [place] 09[place]14[place]  Procedure: RIGHT LONG FINGER TRIGGER RELEASE;  Surgeon: Carole Civil, MD;  Location: [facility];  Service: Orthopedics;  Laterality: Right;   TRIGGER FINGER RELEASE Left 09/28/2015   Procedure: LEFT RING TRIGGER FINGER RELEASE;  Surgeon: Carole Civil, MD;  Location: [facility];  Service: Orthopedics;  Laterality: Left;  left ring finger   TUBAL LIGATION      Current Outpatient Medications  Medication Sig Dispense Refill   albuterol (VENTOLIN HFA) 108 (90 Base) MCG/ACT inhaler INHALE 2 PUFFS INTO THE LUNGS EVERY 4 (FOUR) HOURS AS NEEDED FOR WHEEZING. 54 g 5   amLODipine (NORVASC) 2.5 MG tablet Take 1 tablet (2.5 mg total) by mouth daily. 90 tablet 1   blood glucose meter kit and supplies Dispense based on patient and insurance preference. Pt checking sugars four times a day . (FOR ICD-10 E10.9, E11.9). 1 each 0   busPIRone (BUSPAR) 10 MG tablet Take 1 tablet BID PRN 60 tablet 5   cetirizine (ZYRTEC) 10 MG tablet TAKE ONE TABLET BY  MOUTH ONCE DAILY. 90 tablet 0   clopidogrel (PLAVIX) 75 MG tablet Take 1 tablet (75 mg total) by mouth daily with breakfast. 90 tablet 3   dicyclomine (BENTYL) 10 MG capsule Take 1 capsule (10 mg total) by mouth 3 (three) times daily as needed (for abdominal cramping or diarrhea). 90 capsule 0   fluticasone (FLONASE) 50 MCG/ACT nasal spray Place 2 sprays into both nostrils daily. 48 g 3   glucose blood (ONETOUCH VERIO) test strip USE AS DIRECTED TO TEST BLOOD GLUCOSE 4 TIMES DAILY. DX E 11.9 200 strip 0   HYDROcodone-acetaminophen (NORCO) 7.5-325 MG tablet Take 1 tablet by mouth every 6 (six) hours as needed for moderate pain. 120 tablet 0   HYDROcodone-acetaminophen (NORCO) 7.5-325 MG tablet Take one tablet po every 6 hrs prn moderate pain 120 tablet 0   HYDROcodone-acetaminophen (NORCO) 7.5-325 MG tablet Take 1 tablet by mouth every 6 hours as needed for moderate pain 120 tablet 0   insulin detemir (LEVEMIR) 100 UNIT/ML injection Inject 72 units in the skin in the morning and 72 units into skin qhs- may titrate to 85 units BID 15 mL 5   lisinopril (ZESTRIL) 10 MG tablet Take 1 tablet (10 mg total) by mouth daily. 90 tablet 1   metFORMIN (GLUCOPHAGE) 1000 MG tablet TAKE ONE TABLET BY MOUTH TWO TIMES A DAY. 180 tablet 1   Multiple Vitamin (MULTIVITAMIN WITH MINERALS) TABS Take 1 tablet by mouth daily.     NOVOLOG 100 UNIT/ML injection Inject 10 units into skin with breakfast and 10 units into skin at supper 10 mL 5   ondansetron (ZOFRAN) 8 MG tablet Take one tablet by mouth every 8 hours as needed for nausea/vomiting. 60 tablet 2   pantoprazole (PROTONIX) 40 MG tablet TAKE (1) TABLET BY MOUTH TWICE A DAY BEFORE A MEAL. 180 tablet 3   promethazine (PHENERGAN) 25 MG tablet TAKE ONE TABLET BY MOUTH EVERY EIGHT HOURS AS NEEDED FOR NAUSEA 30 tablet 5   rosuvastatin (CRESTOR) 20 MG tablet Take 1 tablet (20 mg total) by mouth daily. 90 tablet 1   trimethoprim (TRIMPEX) 100 MG tablet Take 1 tablet (100 mg  total) by mouth daily. 90 tablet 3   venlafaxine (EFFEXOR) 75 MG tablet TAKE (1) TABLET BY MOUTH 3 TIMES DAILY 270 tablet 0   No current facility-administered medications for this visit.    Allergies as of 09/03/2022 - Review Complete 09/03/2022  Allergen Reaction Noted   [place]  10 mcg pen [exenatide] Diarrhea and Nausea And Vomiting 02/02/2013   Naproxen Nausea And Vomiting 07/15/2009   Pollen extract  05/05/2020   Augmentin [amoxicillin-pot clavulanate]  03/16/2014   Azithromycin Nausea And Vomiting and Rash 07/15/2009   Erythromycin Hives 02/02/2013   Invokana [canagliflozin]  09/13/2014   Morphine Hives and Rash    Sulfonamide derivatives Nausea And Vomiting and Rash     Family History  Problem Relation Age of Onset   Breast cancer Mother    Diabetes Father    Coronary artery disease Other    Arthritis Other    Asthma Other    Diabetes Sister    Colon cancer Neg Hx     Social History   Socioeconomic History   Marital status: Married    Spouse name: Not on file   Number of children: Not on file   Years of education: Not on file   Highest education level: Not on file  Occupational History   Occupation: nurse tech    Comment: [employer], 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)    Employer: [employer]  Tobacco Use   Smoking status: Former    Packs/day: 0.50    Years: 3.00    Total pack years: 1.50    Types: Cigarettes    Quit date: 09/11/1987    Years since quitting: 35.0   Smokeless tobacco: Never  Substance and Sexual Activity   Alcohol use: No   Drug use: No   Sexual activity: Not Currently    Birth control/protection: Surgical  Other Topics Concern   Not on file  Social History Narrative   Not on file   Social Determinants of Health   Financial Resource Strain: Low Risk  (05/02/2021)   Overall Financial Resource Strain (CARDIA)    Difficulty of Paying Living Expenses: Not hard at all  Food Insecurity: No Food Insecurity (05/02/2021)   Hunger Vital Sign     Worried About Running Out of Food in the Last Year: Never true    Ran Out of Food in the Last Year: Never true  Transportation Needs: No Transportation Needs (05/02/2021)   PRAPARE - Hydrologist (Medical): No    Lack of Transportation (Non-Medical): No  Physical Activity: Insufficiently Active (05/02/2021)   Exercise Vital Sign    Days of Exercise per Week: 2 days    Minutes of Exercise per Session: 20 min  Stress: No Stress Concern Present (05/02/2021)   [place]    Feeling of Stress : Not at all  Social Connections: Moderately Isolated (05/02/2021)   Social Connection and Isolation Panel [NHANES]    Frequency of Communication with Friends and Family: Twice a week    Frequency of Social Gatherings with Friends and Family: More than three times a week    Attends Religious Services: Never    Marine scientist or Organizations: No    Attends Music therapist: Never    Marital Status: Married    Review of Systems: Gen: Denies fever, chills, cold or flulike symptoms, presyncope, syncope. CV: Denies chest pain, palpitations. Resp: Denies dyspnea, cough.  GI: See HPI Heme: See HPI  Physical Exam: BP (!) 145/82 (BP Location: Right Arm, Patient Position: Sitting, Cuff Size: Normal)   Pulse 87   Temp 97.9 F (36.6 C) (Temporal)   Ht $R'5\' 2"'FA$  (1.575 m)   Wt 196 lb 9.6 oz (89.2 kg)   SpO2 100%  BMI 35.96 kg/m  General:   Alert and oriented. No distress noted. Pleasant and cooperative.  Head:  Normocephalic and atraumatic. Eyes:  Conjuctiva clear without scleral icterus. Heart:  S1, S2 present without murmurs appreciated. Lungs:  Clear to auscultation bilaterally. No wheezes, rales, or rhonchi. No distress.  Abdomen:  +BS, soft, non-tender and non-distended. No rebound or guarding. No HSM or masses noted. Msk:  Symmetrical without gross deformities. Normal  posture. Extremities:  Without edema. Neurologic:  Alert and  oriented x4 Psych:  Normal mood and affect.    Assessment:  66 y.o. female with history of stroke, diabetes, rheumatoid arthritis, anxiety/depression, HTN, HLD, GERD, chronic nausea with vomiting, gastroparesis (abnormal GES November 2021), constipation, hyperplastic colon/rectal polyps currently due for colonoscopy, presenting today for follow-up and to discuss scheduling colonoscopy.   GERD: Well-controlled on Protonix 40 mg twice daily.  Gastroparesis: Symptoms fairly well controlled on PPI twice daily and Zofran as needed.  Intermittent nausea, but no vomiting.  I do note that she lost about 10 pounds over the last 6 months which has been unintentional. I suspect this is secondary to decreased oral intake in general as she reports eating 1 or 2 meals a day due to not getting hungry.  Discussed Reglan, but patient is not interested in adding any new medications.  Advised to eat 4-6 small meals a day and if not interested in having a meal, she could try protein shakes as meal replacements to ensure she is having adequate nutrition.  Alternating constipation and diarrhea:  Change in bowel habits from chronic constipation to alternating constipation and diarrhea in December 2022.  Previously denied medication changes, dietary changes, stress changes prior to change in bowel habits.  Associated lower abdominal cramping intermittently that improves with a bowel movement.  No BRBPR or melena.  She has lost about 10 pounds over the last 6 months unintentionally, but suspect this is likely more related to gastroparesis and decreased oral intake as discussed above.  Overall, suspect alternating bowel habits is likely secondary to IBS-C with overflow diarrhea, but unable to rule out malignancy.  We will treat supportively and proceed with a colonoscopy as she is due for screening at this time.   History of colon polyps: Due for screening  colonoscopy. Last colonoscopy in 2013 with hyperplastic sigmoid and rectal polyps, mild pancolonic diverticulosis, small internal hemorrhoids.  Recommended 10-year repeat.     Plan:  Proceed with colonoscopy with propofol by Dr. Abbey Chatters in near future. The risks, benefits, and alternatives have been discussed with the patient in detail. The patient states understanding and desires to proceed.  ASA 3 Hold Plavix x5 days prior to procedure. 1 day prior to procedure: One half dose of Levemir in the morning and evening, mealtime insulin as needed, one half dose of metformin morning and evening. Day of procedure: No morning diabetes medications. Start MiraLAX 17 g daily.  Counseled that patient may experience some loose stools/diarrhea for several days, but I suspect this will taper off. Continue Metamucil Gummies. Use dicyclomine 10 mg up to 3 times a day as needed for abdominal cramping, hold in the setting of constipation. Continue pantoprazole 40 mg twice daily. Continue Zofran as needed. Recommended 4-6 small meals daily, low-fat/low fiber diet.  Can try adding protein shakes as meal replacements if not very hungry in efforts to supplement nutritional intake.   Follow-up after colonoscopy.    Aliene Altes, PA-C [facility] 09/03/2022

## 2022-09-03 ENCOUNTER — Ambulatory Visit (HOSPITAL_COMMUNITY)
Admission: RE | Admit: 2022-09-03 | Discharge: 2022-09-03 | Disposition: A | Discharge status: Home / Self Care | Payer: HMO | Source: Ambulatory Visit | Attending: Family Medicine | Admitting: Family Medicine

## 2022-09-03 ENCOUNTER — Encounter: Payer: Self-pay | Admitting: *Deleted

## 2022-09-03 ENCOUNTER — Encounter: Payer: Self-pay | Admitting: Gastroenterology

## 2022-09-03 ENCOUNTER — Ambulatory Visit (INDEPENDENT_AMBULATORY_CARE_PROVIDER_SITE_OTHER): Payer: HMO | Admitting: Gastroenterology

## 2022-09-03 VITALS — BP 145/82 | HR 87 | Temp 97.9°F | Ht 62.0 in | Wt 196.6 lb

## 2022-09-03 DIAGNOSIS — K3184 Gastroparesis: Secondary | ICD-10-CM

## 2022-09-03 DIAGNOSIS — R198 Other specified symptoms and signs involving the digestive system and abdomen: Secondary | ICD-10-CM

## 2022-09-03 DIAGNOSIS — Z1231 Encounter for screening mammogram for malignant neoplasm of breast: Secondary | ICD-10-CM | POA: Insufficient documentation

## 2022-09-03 DIAGNOSIS — K219 Gastro-esophageal reflux disease without esophagitis: Secondary | ICD-10-CM

## 2022-09-03 DIAGNOSIS — Z1239 Encounter for other screening for malignant neoplasm of breast: Secondary | ICD-10-CM | POA: Diagnosis present

## 2022-09-03 DIAGNOSIS — Z86010 Personal history of colonic polyps: Secondary | ICD-10-CM

## 2022-09-03 MED ORDER — PEG 3350-KCL-NA BICARB-NACL 420 G PO SOLR: 4000.0000 mL | Freq: Once | ORAL | 0 refills | Status: AC

## 2022-09-03 MED ORDER — DICYCLOMINE HCL 10 MG PO CAPS: 10.0000 mg | ORAL_CAPSULE | Freq: Three times a day (TID) | ORAL | 0 refills | Status: AC | PRN

## 2022-09-03 NOTE — Patient Instructions (Signed)
I suspect your alternating bowel habits between constipation and diarrhea along with abdominal cramping is secondary to irritable bowel syndrome, primarily with constipation.  You are likely experiencing overflow diarrhea as you are going several days without a bowel movement.  Start MiraLAX 1 capful (17 g) daily in 8 ounces of water.  You will likely experience loose stool/diarrhea for several days, but I suspect this will taper off once some of the stools cleaned out of your colon.  Continue taking Metamucil Gummies.  You may use dicyclomine 10 mg up to 3 times a day as needed for abdominal cramping, but hold in the setting of constipation.   Continue pantoprazole 40 mg twice daily 30 minutes before breakfast and dinner.  Continue to use Zofran as needed for nausea.  Gastroparesis diet recommendations:  4-6 small meals daily Low fat diet Low fiber diet (avoid raw fruits and vegetables). You can try drinking protein shakes as meal replacements if you are not very hungry rather than skipping meals all together.   We will see you back after your colonoscopy.   It was great to see you again today!   Aliene Altes, PA-C [facility]

## 2022-09-04 ENCOUNTER — Encounter: Payer: Self-pay | Admitting: *Deleted

## 2022-09-18 NOTE — Patient Instructions (Addendum)
Jamie Harding  [birth year]     '@PREFPERIOPPHARMACY'$ @   Your procedure is scheduled on  09/24/2022.   Report to [name] at  1015  A.M.   Call this number if you have problems the morning of surgery:  [phone]  If you experience any cold or flu symptoms such as cough, fever, chills, shortness of breath, etc. between now and your scheduled surgery, please notify us at the above number.   Remember:  Follow the diet and prep instructions given to you by the office.        Your last dose of plavix should be on 09/18/2022.     Use your inhaler before you come and bring your rescue inhaler with you.      Take 1/2 of your night time levemir dose (36 units) the night before your procedure.      DO NOT take any medications for diabetes the morning of your procedure.     Take these medicines the morning of surgery with A SIP OF WATER          amlodipine, buspirone, zyrtec, hydrocodone(if needed), zofran (if needed), protonix, trimpex, effexor.     Do not wear jewelry, make-up or nail polish.  Do not wear lotions, powders, or perfumes, or deodorant.  Do not shave 48 hours prior to surgery.  Men may shave face and neck.  Do not bring valuables to the hospital.  [facility] is not responsible for any belongings or valuables.  Contacts, dentures or bridgework may not be worn into surgery.  Leave your suitcase in the car.  After surgery it may be brought to your room.  For patients admitted to the hospital, discharge time will be determined by your treatment team.  Patients discharged the day of surgery will not be allowed to drive home and must have someone with them for 24 hours.    Special instructions:   DO NOT smoke tobacco or vape for 24 hours before your procedure.  Please read over the following fact sheets that you were given. Anesthesia Post-op Instructions and Care and Recovery After Surgery      Colonoscopy, Adult, Care After The following information  offers guidance on how to care for yourself after your procedure. Your health care provider may also give you more specific instructions. If you have problems or questions, contact your health care provider. What can I expect after the procedure? After the procedure, it is common to have: A small amount of blood in your stool for 24 hours after the procedure. Some gas. Mild cramping or bloating of your abdomen. Follow these instructions at home: Eating and drinking  Drink enough fluid to keep your urine pale yellow. Follow instructions from your health care provider about eating or drinking restrictions. Resume your normal diet as told by your health care provider. Avoid heavy or fried foods that are hard to digest. Activity Rest as told by your health care provider. Avoid sitting for a long time without moving. Get up to take short walks every 1-2 hours. This is important to improve blood flow and breathing. Ask for help if you feel weak or unsteady. Return to your normal activities as told by your health care provider. Ask your health care provider what activities are safe for you. Managing cramping and bloating  Try walking around when you have cramps or feel bloated. If directed, apply heat to your abdomen as told by your health care provider. Use  the heat source that your health care provider recommends, such as a moist heat pack or a heating pad. Place a towel between your skin and the heat source. Leave the heat on for 20-30 minutes. Remove the heat if your skin turns bright red. This is especially important if you are unable to feel pain, heat, or cold. You have a greater risk of getting burned. General instructions If you were given a sedative during the procedure, it can affect you for several hours. Do not drive or operate machinery until your health care provider says that it is safe. For the first 24 hours after the procedure: Do not sign important documents. Do not drink  alcohol. Do your regular daily activities at a slower pace than normal. Eat soft foods that are easy to digest. Take over-the-counter and prescription medicines only as told by your health care provider. Keep all follow-up visits. This is important. Contact a health care provider if: You have blood in your stool 2-3 days after the procedure. Get help right away if: You have more than a small spotting of blood in your stool. You have large blood clots in your stool. You have swelling of your abdomen. You have nausea or vomiting. You have a fever. You have increasing pain in your abdomen that is not relieved with medicine. These symptoms may be an emergency. Get help right away. Call 911. Do not wait to see if the symptoms will go away. Do not drive yourself to the hospital. Summary After the procedure, it is common to have a small amount of blood in your stool. You may also have mild cramping and bloating of your abdomen. If you were given a sedative during the procedure, it can affect you for several hours. Do not drive or operate machinery until your health care provider says that it is safe. Get help right away if you have a lot of blood in your stool, nausea or vomiting, a fever, or increased pain in your abdomen. This information is not intended to replace advice given to you by your health care provider. Make sure you discuss any questions you have with your health care provider. Document Revised: 06/28/2021 Document Reviewed: 06/28/2021 Elsevier Patient Education  [place] After The following information offers guidance on how to care for yourself after your procedure. Your health care provider may also give you more specific instructions. If you have problems or questions, contact your health care provider. What can I expect after the procedure? After the procedure, it is common to have: Tiredness. Little or no memory about what happened  during or after the procedure. Impaired judgment when it comes to making decisions. Nausea or vomiting. Some trouble with balance. Follow these instructions at home: For the time period you were told by your health care provider:  Rest. Do not participate in activities where you could fall or become injured. Do not drive or use machinery. Do not drink alcohol. Do not take sleeping pills or medicines that cause drowsiness. Do not make important decisions or sign legal documents. Do not take care of children on your own. Medicines Take over-the-counter and prescription medicines only as told by your health care provider. If you were prescribed antibiotics, take them as told by your health care provider. Do not stop using the antibiotic even if you start to feel better. Eating and drinking Follow instructions from your health care provider about what you may eat and drink. Drink enough fluid  to keep your urine pale yellow. If you vomit: Drink clear fluids slowly and in small amounts as you are able. Clear fluids include water, ice chips, low-calorie sports drinks, and fruit juice that has water added to it (diluted fruit juice). Eat light and bland foods in small amounts as you are able. These foods include bananas, applesauce, rice, lean meats, toast, and crackers. General instructions  Have a responsible adult stay with you for the time you are told. It is important to have someone help care for you until you are awake and alert. If you have sleep apnea, surgery and some medicines can increase your risk for breathing problems. Follow instructions from your health care provider about wearing your sleep device: When you are sleeping. This includes during daytime naps. While taking prescription pain medicines, sleeping medicines, or medicines that make you drowsy. Do not use any products that contain nicotine or tobacco. These products include cigarettes, chewing tobacco, and vaping devices,  such as e-cigarettes. If you need help quitting, ask your health care provider. Contact a health care provider if: You feel nauseous or vomit every time you eat or drink. You feel light-headed. You are still sleepy or having trouble with balance after 24 hours. You get a rash. You have a fever. You have redness or swelling around the IV site. Get help right away if: You have trouble breathing. You have new confusion after you get home. These symptoms may be an emergency. Get help right away. Call 911. Do not wait to see if the symptoms will go away. Do not drive yourself to the hospital. This information is not intended to replace advice given to you by your health care provider. Make sure you discuss any questions you have with your health care provider. Document Revised: 04/02/2022 Document Reviewed: 04/02/2022 Elsevier Patient Education  [place].

## 2022-09-19 ENCOUNTER — Other Ambulatory Visit: Payer: Self-pay

## 2022-09-19 ENCOUNTER — Encounter (HOSPITAL_COMMUNITY): Payer: Self-pay

## 2022-09-19 ENCOUNTER — Encounter (HOSPITAL_COMMUNITY)
Admission: RE | Admit: 2022-09-19 | Discharge: 2022-09-19 | Disposition: A | Discharge status: Home / Self Care | Payer: HMO | Source: Ambulatory Visit | Attending: Internal Medicine | Admitting: Internal Medicine

## 2022-09-19 VITALS — BP 150/69 | HR 82 | Temp 97.9°F | Resp 18 | Ht 62.0 in | Wt 196.6 lb

## 2022-09-19 DIAGNOSIS — Z01810 Encounter for preprocedural cardiovascular examination: Secondary | ICD-10-CM | POA: Diagnosis present

## 2022-09-19 DIAGNOSIS — I10 Essential (primary) hypertension: Secondary | ICD-10-CM | POA: Insufficient documentation

## 2022-09-24 ENCOUNTER — Ambulatory Visit (HOSPITAL_COMMUNITY)
Admission: RE | Admit: 2022-09-24 | Discharge: 2022-09-24 | Disposition: A | Discharge status: Home / Self Care | Payer: HMO | Attending: Internal Medicine | Admitting: Internal Medicine

## 2022-09-24 ENCOUNTER — Encounter (HOSPITAL_COMMUNITY): Payer: Self-pay

## 2022-09-24 ENCOUNTER — Ambulatory Visit (HOSPITAL_BASED_OUTPATIENT_CLINIC_OR_DEPARTMENT_OTHER): Payer: HMO | Admitting: Anesthesiology

## 2022-09-24 ENCOUNTER — Other Ambulatory Visit: Payer: Self-pay

## 2022-09-24 ENCOUNTER — Encounter (HOSPITAL_COMMUNITY)
Admission: RE | Disposition: A | Discharge status: Home / Self Care | Payer: Self-pay | Source: Home / Self Care | Attending: Internal Medicine

## 2022-09-24 ENCOUNTER — Ambulatory Visit (HOSPITAL_COMMUNITY): Payer: HMO | Admitting: Anesthesiology

## 2022-09-24 DIAGNOSIS — K5730 Diverticulosis of large intestine without perforation or abscess without bleeding: Secondary | ICD-10-CM

## 2022-09-24 DIAGNOSIS — R110 Nausea: Secondary | ICD-10-CM | POA: Diagnosis not present

## 2022-09-24 DIAGNOSIS — F1120 Opioid dependence, uncomplicated: Secondary | ICD-10-CM | POA: Insufficient documentation

## 2022-09-24 DIAGNOSIS — F419 Anxiety disorder, unspecified: Secondary | ICD-10-CM | POA: Diagnosis not present

## 2022-09-24 DIAGNOSIS — D120 Benign neoplasm of cecum: Secondary | ICD-10-CM

## 2022-09-24 DIAGNOSIS — Z79899 Other long term (current) drug therapy: Secondary | ICD-10-CM | POA: Diagnosis not present

## 2022-09-24 DIAGNOSIS — F32A Depression, unspecified: Secondary | ICD-10-CM | POA: Insufficient documentation

## 2022-09-24 DIAGNOSIS — E1143 Type 2 diabetes mellitus with diabetic autonomic (poly)neuropathy: Secondary | ICD-10-CM | POA: Diagnosis not present

## 2022-09-24 DIAGNOSIS — K5900 Constipation, unspecified: Secondary | ICD-10-CM | POA: Diagnosis not present

## 2022-09-24 DIAGNOSIS — Z1212 Encounter for screening for malignant neoplasm of rectum: Secondary | ICD-10-CM | POA: Diagnosis not present

## 2022-09-24 DIAGNOSIS — Z8673 Personal history of transient ischemic attack (TIA), and cerebral infarction without residual deficits: Secondary | ICD-10-CM | POA: Diagnosis not present

## 2022-09-24 DIAGNOSIS — J45909 Unspecified asthma, uncomplicated: Secondary | ICD-10-CM | POA: Insufficient documentation

## 2022-09-24 DIAGNOSIS — Z1211 Encounter for screening for malignant neoplasm of colon: Secondary | ICD-10-CM | POA: Diagnosis not present

## 2022-09-24 DIAGNOSIS — K219 Gastro-esophageal reflux disease without esophagitis: Secondary | ICD-10-CM | POA: Insufficient documentation

## 2022-09-24 DIAGNOSIS — Z8719 Personal history of other diseases of the digestive system: Secondary | ICD-10-CM | POA: Insufficient documentation

## 2022-09-24 DIAGNOSIS — Z86010 Personal history of colonic polyps: Secondary | ICD-10-CM

## 2022-09-24 DIAGNOSIS — I10 Essential (primary) hypertension: Secondary | ICD-10-CM | POA: Insufficient documentation

## 2022-09-24 DIAGNOSIS — M21372 Foot drop, left foot: Secondary | ICD-10-CM | POA: Insufficient documentation

## 2022-09-24 DIAGNOSIS — Z794 Long term (current) use of insulin: Secondary | ICD-10-CM | POA: Insufficient documentation

## 2022-09-24 DIAGNOSIS — Z7902 Long term (current) use of antithrombotics/antiplatelets: Secondary | ICD-10-CM | POA: Diagnosis not present

## 2022-09-24 DIAGNOSIS — Z7984 Long term (current) use of oral hypoglycemic drugs: Secondary | ICD-10-CM | POA: Diagnosis not present

## 2022-09-24 DIAGNOSIS — M069 Rheumatoid arthritis, unspecified: Secondary | ICD-10-CM | POA: Insufficient documentation

## 2022-09-24 DIAGNOSIS — K3184 Gastroparesis: Secondary | ICD-10-CM | POA: Insufficient documentation

## 2022-09-24 DIAGNOSIS — K449 Diaphragmatic hernia without obstruction or gangrene: Secondary | ICD-10-CM | POA: Diagnosis not present

## 2022-09-24 DIAGNOSIS — Z8249 Family history of ischemic heart disease and other diseases of the circulatory system: Secondary | ICD-10-CM | POA: Insufficient documentation

## 2022-09-24 DIAGNOSIS — Z87891 Personal history of nicotine dependence: Secondary | ICD-10-CM | POA: Insufficient documentation

## 2022-09-24 DIAGNOSIS — Z833 Family history of diabetes mellitus: Secondary | ICD-10-CM | POA: Insufficient documentation

## 2022-09-24 DIAGNOSIS — E785 Hyperlipidemia, unspecified: Secondary | ICD-10-CM | POA: Insufficient documentation

## 2022-09-24 DIAGNOSIS — Z8261 Family history of arthritis: Secondary | ICD-10-CM | POA: Insufficient documentation

## 2022-09-24 HISTORY — PX: COLONOSCOPY WITH PROPOFOL: SHX5780

## 2022-09-24 HISTORY — PX: POLYPECTOMY: SHX5525

## 2022-09-24 LAB — GLUCOSE, CAPILLARY: Glucose-Capillary: 175 mg/dL — ABNORMAL HIGH (ref 70–99)

## 2022-09-24 SURGERY — COLONOSCOPY WITH PROPOFOL
Anesthesia: General

## 2022-09-24 MED ORDER — LACTATED RINGERS IV SOLN: INTRAVENOUS | Status: DC

## 2022-09-24 MED ORDER — PROPOFOL 10 MG/ML IV BOLUS
INTRAVENOUS | Status: DC | PRN
  Administered 2022-09-24: 80 mg via INTRAVENOUS

## 2022-09-24 MED ORDER — LIDOCAINE HCL (CARDIAC) PF 100 MG/5ML IV SOSY
PREFILLED_SYRINGE | INTRAVENOUS | Status: DC | PRN
  Administered 2022-09-24: 60 mg via INTRATRACHEAL

## 2022-09-24 MED ORDER — PROPOFOL 500 MG/50ML IV EMUL
INTRAVENOUS | Status: DC | PRN
  Administered 2022-09-24: 150 ug/kg/min via INTRAVENOUS

## 2022-09-24 NOTE — Anesthesia Preprocedure Evaluation (Signed)
Anesthesia Evaluation  Patient identified by MRN, date of birth, ID band Patient awake    Reviewed: Allergy & Precautions, H&P , NPO status , Patient's Chart, lab work & pertinent test results  History of Anesthesia Complications Negative for: history of anesthetic complications  Airway Mallampati: II  TM Distance: >3 FB Neck ROM: Full    Dental  (+) Dental Advisory Given, Edentulous Upper, Missing   Pulmonary asthma , former smoker   Pulmonary exam normal breath sounds clear to auscultation       Cardiovascular METS: 3 - Mets hypertension, Pt. on medications + DOE  Normal cardiovascular exam Rhythm:Regular Rate:Normal   1. Left ventricular ejection fraction, by estimation, is 60 to 65%. The  left ventricle has normal function. The left ventricle has no regional  wall motion abnormalities. There is moderate left ventricular hypertrophy.  Left ventricular diastolic  parameters are consistent with Grade I diastolic dysfunction (impaired  relaxation).   2. Right ventricular systolic function is normal. The right ventricular  size is normal.   3. The mitral valve is normal in structure. No evidence of mitral valve  regurgitation. No evidence of mitral stenosis.   4. The aortic valve is tricuspid. Aortic valve regurgitation is not  visualized. No aortic stenosis is present.   5. The inferior vena cava is normal in size with greater than 50%  respiratory variability, suggesting right atrial pressure of 3 mmHg.      Neuro/Psych  PSYCHIATRIC DISORDERS Anxiety Depression     Neuromuscular disease (left foot drop) CVA, No Residual Symptoms    GI/Hepatic Neg liver ROS, hiatal hernia,GERD (gastroperesis)  Medicated and Controlled,,  Endo/Other  diabetes, Well Controlled, Type 2, Oral Hypoglycemic Agents, Insulin Dependent    Renal/GU Renal InsufficiencyRenal disease  negative genitourinary   Musculoskeletal  (+) Arthritis ,  Rheumatoid disorders,  narcotic dependent  Abdominal   Peds negative pediatric ROS (+)  Hematology negative hematology ROS (+)   Anesthesia Other Findings stress test 2021  Narrative & Impression  There was no ST segment deviation noted during stress.  The study is normal. There are no perfusion defects  This is a low risk study.  The left ventricular ejection fraction is hyperdynamic (>65%).      Reproductive/Obstetrics negative OB ROS                             Anesthesia Physical Anesthesia Plan  ASA: 3  Anesthesia Plan: General   Post-op Pain Management: Minimal or no pain anticipated   Induction: Intravenous  PONV Risk Score and Plan:   Airway Management Planned: Nasal Cannula and Natural Airway  Additional Equipment:   Intra-op Plan:   Post-operative Plan:   Informed Consent: I have reviewed the patients History and Physical, chart, labs and discussed the procedure including the risks, benefits and alternatives for the proposed anesthesia with the patient or authorized representative who has indicated his/her understanding and acceptance.     Dental advisory given  Plan Discussed with: CRNA and Surgeon  Anesthesia Plan Comments:         Anesthesia Quick Evaluation

## 2022-09-24 NOTE — Transfer of Care (Signed)
Immediate Anesthesia Transfer of Care Note  Patient: Jamie Harding  Procedure(s) Performed: COLONOSCOPY WITH PROPOFOL POLYPECTOMY  Patient Location: Short Stay  Anesthesia Type:General  Level of Consciousness: awake, alert , oriented, and patient cooperative  Airway & Oxygen Therapy: Patient Spontanous Breathing  Post-op Assessment: Report given to RN, Post -op Vital signs reviewed and stable, and Patient moving all extremities X 4  Post vital signs: Reviewed and stable  Last Vitals:  Vitals Value Taken Time  BP 108/62 09/24/22 1206  Temp 36.8 C 09/24/22 1206  Pulse 86 09/24/22 1206  Resp 13 09/24/22 1206  SpO2 97 % 09/24/22 1206    Last Pain:  Vitals:   09/24/22 1206  TempSrc: Oral  PainSc: 0-No pain         Complications: No notable events documented.

## 2022-09-24 NOTE — Anesthesia Postprocedure Evaluation (Signed)
Anesthesia Post Note  Patient: Jamie Harding  Procedure(s) Performed: COLONOSCOPY WITH PROPOFOL POLYPECTOMY  Patient location during evaluation: Phase II Anesthesia Type: General Level of consciousness: awake and alert and oriented Pain management: pain level controlled Vital Signs Assessment: post-procedure vital signs reviewed and stable Respiratory status: spontaneous breathing, nonlabored ventilation and respiratory function stable Cardiovascular status: blood pressure returned to baseline and stable Postop Assessment: no apparent nausea or vomiting Anesthetic complications: no  No notable events documented.   Last Vitals:  Vitals:   09/24/22 1039 09/24/22 1206  BP: (!) 153/80 108/62  Pulse: 90 86  Resp: 16 13  Temp: 36.7 C 36.8 C  SpO2: 100% 97%    Last Pain:  Vitals:   09/24/22 1206  TempSrc: Oral  PainSc: 0-No pain                 Brionna Romanek C Michial Disney

## 2022-09-24 NOTE — Discharge Instructions (Addendum)
  Colonoscopy Discharge Instructions  Read the instructions outlined below and refer to this sheet in the next few weeks. These discharge instructions provide you with general information on caring for yourself after you leave the hospital. Your doctor may also give you specific instructions. While your treatment has been planned according to the most current medical practices available, unavoidable complications occasionally occur.   ACTIVITY You may resume your regular activity, but move at a slower pace for the next 24 hours.  Take frequent rest periods for the next 24 hours.  Walking will help get rid of the air and reduce the bloated feeling in your belly (abdomen).  No driving for 24 hours (because of the medicine (anesthesia) used during the test).   Do not sign any important legal documents or operate any machinery for 24 hours (because of the anesthesia used during the test).  NUTRITION Drink plenty of fluids.  You may resume your normal diet as instructed by your doctor.  Begin with a light meal and progress to your normal diet. Heavy or fried foods are harder to digest and may make you feel sick to your stomach (nauseated).  Avoid alcoholic beverages for 24 hours or as instructed.  MEDICATIONS You may resume your normal medications unless your doctor tells you otherwise.  WHAT YOU CAN EXPECT TODAY Some feelings of bloating in the abdomen.  Passage of more gas than usual.  Spotting of blood in your stool or on the toilet paper.  IF YOU HAD POLYPS REMOVED DURING THE COLONOSCOPY: No aspirin products for 7 days or as instructed.  No alcohol for 7 days or as instructed.  Eat a soft diet for the next 24 hours.  FINDING OUT THE RESULTS OF YOUR TEST Not all test results are available during your visit. If your test results are not back during the visit, make an appointment with your caregiver to find out the results. Do not assume everything is normal if you have not heard from your  caregiver or the medical facility. It is important for you to follow up on all of your test results.  SEEK IMMEDIATE MEDICAL ATTENTION IF: You have more than a spotting of blood in your stool.  Your belly is swollen (abdominal distention).  You are nauseated or vomiting.  You have a temperature over 101.  You have abdominal pain or discomfort that is severe or gets worse throughout the day.   Unfortunately, your colon was not adequately prepped today for colonoscopy.  I did not see any evidence of colon cancer or large polyps, but certainly could have missed smaller polyps due to poor visualization.  Would recommend repeat colonoscopy in 6 months with a different colon prep.  I was able to remove 1 polyp which I did not see.  Await pathology results, my office will contact you.  Follow-up with GI in 2 to 3 months. Message sent to office and will schedule this appointment with you.    I hope you have a great rest of your week!  Elon Alas. Abbey Chatters, D.O. Gastroenterology and Hepatology [facility]

## 2022-09-24 NOTE — Interval H&P Note (Signed)
History and Physical Interval Note:  97/07/8920 19:41 AM  Jamie Harding  has presented today for surgery, with the diagnosis of HX COLON POLYPS.  The various methods of treatment have been discussed with the patient and family. After consideration of risks, benefits and other options for treatment, the patient has consented to  Procedure(s) with comments: COLONOSCOPY WITH PROPOFOL (N/A) - 12:15pm, asa 3 as a surgical intervention.  The patient's history has been reviewed, patient examined, no change in status, stable for surgery.  I have reviewed the patient's chart and labs.  Questions were answered to the patient's satisfaction.     Eloise Harman

## 2022-09-24 NOTE — Op Note (Signed)
[facility] Patient Name: Jamie Harding Procedure Date: 09/24/2022 11:34 AM MRN: [id number] Date of Birth: 12-Dec-1955 Attending MD: Elon Alas. Abbey Chatters , [place], [phone] CSN: [id number] Age: 66 Admit Type: Outpatient Procedure:                Colonoscopy Indications:              Screening for colorectal malignant neoplasm Providers:                Elon Alas. Abbey Chatters, DO, Janeece Riggers, RN, Kristine L.                            Risa Grill, Technician Referring MD:              Medicines:                See the Anesthesia note for documentation of the                            administered medications Complications:            No immediate complications. Estimated Blood Loss:     Estimated blood loss was minimal. Procedure:                Pre-Anesthesia Assessment:                           - The anesthesia plan was to use monitored                            anesthesia care (MAC).                           After obtaining informed consent, the colonoscope                            was passed under direct vision. Throughout the                            procedure, the patient's blood pressure, pulse, and                            oxygen saturations were monitored continuously. The                            [id number]-[id number] ([id number]) was introduced through the                            anus and advanced to the the cecum, identified by                            appendiceal orifice and ileocecal valve. The                            colonoscopy was performed without difficulty. The                            patient tolerated the procedure  well. The quality                            of the bowel preparation was evaluated using the                            BBPS [facility] Bowel Preparation Scale) with scores                            of: Right Colon = 2 (minor amount of residual                            staining, small fragments of stool and/or opaque                            liquid,  but mucosa seen well), Transverse Colon = 1                            (portion of mucosa seen, but other areas not well                            seen due to staining, residual stool and/or opaque                            liquid) and Left Colon = 1 (portion of mucosa seen,                            but other areas not well seen due to staining,                            residual stool and/or opaque liquid). The total                            BBPS score equals 4. The quality of the bowel                            preparation was inadequate. Scope In: 11:50:18 AM Scope Out: 12:00:04 PM Scope Withdrawal Time: 0 hours 5 minutes 23 seconds  Total Procedure Duration: 0 hours 9 minutes 46 seconds  Findings:      The perianal and digital rectal examinations were normal.      Multiple medium-mouthed diverticula were found in the sigmoid colon.      A 5 mm polyp was found in the cecum. The polyp was sessile. The polyp       was removed with a cold snare. Resection and retrieval were complete.      Extensive amounts of semi-solid stool was found in the sigmoid colon, in       the descending colon and in the transverse colon, precluding       visualization. Lavage of the area was performed using copious amounts of       sterile water, resulting in incomplete clearance with continued poor       visualization. Impression:               -  Preparation of the colon was inadequate.                           - Diverticulosis in the sigmoid colon.                           - One 5 mm polyp in the cecum, removed with a cold                            snare. Resected and retrieved.                           - Stool in the sigmoid colon, in the descending                            colon and in the transverse colon. Moderate Sedation:      Per Anesthesia Care Recommendation:           - Patient has a contact number available for                            emergencies. The signs and symptoms of  potential                            delayed complications were discussed with the                            patient. Return to normal activities tomorrow.                            Written discharge instructions were provided to the                            patient.                           - Resume previous diet.                           - Continue present medications.                           - Await pathology results.                           - Repeat colonoscopy in 3-6 months because the                            bowel preparation was poor. Need extended clear                            liquid diet.                           - Return to GI clinic in 2-3 months. Procedure Code(s):        --- Professional ---  45385, Colonoscopy, flexible; with removal of                            tumor(s), polyp(s), or other lesion(s) by snare                            technique Diagnosis Code(s):        --- Professional ---                           Z12.11, Encounter for screening for malignant                            neoplasm of colon                           D12.0, Benign neoplasm of cecum                           K57.30, Diverticulosis of large intestine without                            perforation or abscess without bleeding CPT copyright 2022 American Medical Association. All rights reserved. The codes documented in this report are preliminary and upon coder review may  be revised to meet current compliance requirements. Elon Alas. Abbey Chatters, DO [place] Abbey Chatters, DO 09/24/2022 12:04:03 PM This report has been signed electronically. Number of Addenda: 0

## 2022-09-25 LAB — SURGICAL PATHOLOGY

## 2022-09-28 ENCOUNTER — Encounter (HOSPITAL_COMMUNITY): Payer: Self-pay | Admitting: Internal Medicine

## 2022-10-16 ENCOUNTER — Encounter: Payer: Self-pay | Admitting: Nurse Practitioner

## 2022-10-16 ENCOUNTER — Ambulatory Visit (INDEPENDENT_AMBULATORY_CARE_PROVIDER_SITE_OTHER): Payer: HMO | Admitting: Nurse Practitioner

## 2022-10-16 VITALS — BP 148/89 | HR 80 | Ht 62.0 in | Wt 197.0 lb

## 2022-10-16 DIAGNOSIS — Z0000 Encounter for general adult medical examination without abnormal findings: Secondary | ICD-10-CM | POA: Diagnosis not present

## 2022-10-16 NOTE — Progress Notes (Signed)
   Subjective:    Patient ID: Jamie Harding, female    DOB: 09/27/1956, 66 y.o.   MRN: [id number]  HPI AWV- Annual Wellness Visit  The patient was seen for their annual wellness visit. The patient's past medical history, surgical history, and family history were reviewed. Pertinent vaccines were reviewed ( tetanus, pneumonia, shingles, flu) The patient's medication list was reviewed and updated.  The height and weight were entered.  BMI recorded in electronic record elsewhere  Cognitive screening was completed. Outcome of Mini - Cog: Pass    Falls /depression screening electronically recorded within record elsewhere  Current tobacco usage: None (All patients who use tobacco were given written and verbal information on quitting)  Recent listing of emergency department/hospitalizations over the past year were reviewed.  current specialist the patient sees on a regular basis: Eye Dr.   Elvin So annual wellness visit patient questionnaire was reviewed.  A written screening schedule for the patient for the next 5-10 years was given. Appropriate discussion of followup regarding next visit was discussed.      Review of Systems  All other systems reviewed and are negative.      Objective:   Physical Exam Vitals reviewed.  Constitutional:      General: She is not in acute distress.    Appearance: Normal appearance. She is obese. She is not ill-appearing, toxic-appearing or diaphoretic.  Musculoskeletal:     Comments: Ambulates without difficulty  Neurological:     Mental Status: She is alert.  Psychiatric:        Mood and Affect: Mood normal.        Behavior: Behavior normal.           Assessment & Plan:   1. Medicare annual wellness visit, initial Adult wellness-complete.wellness physical was conducted today. Importance of diet and exercise were discussed in detail.  Importance of stress reduction and healthy living were discussed.  In addition to this a  discussion regarding safety was also covered.  We also reviewed over immunizations and gave recommendations regarding current immunization needed for age.   In addition to this additional areas were also touched on including: Preventative health exams needed:  Colonoscopy up to date. Due 2033 Mammogram up to date. Due 08/2023 COVID vaccine #3 declined today PAP Due. Patient to schedule with Mrs. Carolyn Shingles vaccine #2 due. Patient to schedule with local pharmacy Eye Exam schedule in March.  Patient was advised yearly wellness exam

## 2022-10-16 NOTE — Patient Instructions (Addendum)
Thank you for coming for your annual wellness visit.  Please follow through on any advice that was given to you by today's visit. Remember to maintain compliance with your medications as discussed today.  Also remember it is important to eat a healthy diet and to stay physically active on a daily basis.  Please follow through with any testing or recommended followup office visits as was discussed today. You are due the following test coming up:  Colonoscopy up to date. Due 2033 Mammogram up to date. Due 08/2023 COVID vaccine #3 declined today PAP Due. Patient to schedule with Mrs. Carolyn Shingles vaccine #2 due. Patient to schedule with local pharmacy Eye Exam schedule in March.       Finally remembered that the annual wellness visit does not take the place of regularly scheduled office visits  chronic health problems such as hypertension/diabetes/cholesterol visits.

## 2022-11-06 ENCOUNTER — Ambulatory Visit: Payer: PPO | Admitting: Family Medicine

## 2022-11-08 ENCOUNTER — Ambulatory Visit (INDEPENDENT_AMBULATORY_CARE_PROVIDER_SITE_OTHER): Payer: HMO | Admitting: Family Medicine

## 2022-11-08 VITALS — BP 124/70 | HR 85 | Temp 97.7°F | Ht 62.0 in | Wt 193.0 lb

## 2022-11-08 DIAGNOSIS — G894 Chronic pain syndrome: Secondary | ICD-10-CM | POA: Diagnosis not present

## 2022-11-08 DIAGNOSIS — E785 Hyperlipidemia, unspecified: Secondary | ICD-10-CM

## 2022-11-08 DIAGNOSIS — E1159 Type 2 diabetes mellitus with other circulatory complications: Secondary | ICD-10-CM

## 2022-11-08 DIAGNOSIS — E1169 Type 2 diabetes mellitus with other specified complication: Secondary | ICD-10-CM | POA: Diagnosis not present

## 2022-11-08 DIAGNOSIS — I10 Essential (primary) hypertension: Secondary | ICD-10-CM | POA: Diagnosis not present

## 2022-11-08 MED ORDER — HYDROCODONE-ACETAMINOPHEN 7.5-325 MG PO TABS: 1.0000 | ORAL_TABLET | Freq: Four times a day (QID) | ORAL | 0 refills | Status: DC | PRN

## 2022-11-08 MED ORDER — METFORMIN HCL 1000 MG PO TABS: ORAL_TABLET | ORAL | 1 refills | Status: DC

## 2022-11-08 MED ORDER — BUSPIRONE HCL 10 MG PO TABS: ORAL_TABLET | ORAL | 5 refills | Status: DC

## 2022-11-08 MED ORDER — AMLODIPINE BESYLATE 2.5 MG PO TABS: 2.5000 mg | ORAL_TABLET | Freq: Every day | ORAL | 1 refills | Status: DC

## 2022-11-08 MED ORDER — ROSUVASTATIN CALCIUM 20 MG PO TABS: 20.0000 mg | ORAL_TABLET | Freq: Every day | ORAL | 1 refills | Status: DC

## 2022-11-08 MED ORDER — ALBUTEROL SULFATE HFA 108 (90 BASE) MCG/ACT IN AERS: INHALATION_SPRAY | RESPIRATORY_TRACT | 5 refills | Status: AC

## 2022-11-08 MED ORDER — LISINOPRIL 10 MG PO TABS: 10.0000 mg | ORAL_TABLET | Freq: Every day | ORAL | 1 refills | Status: DC

## 2022-11-08 MED ORDER — VENLAFAXINE HCL 75 MG PO TABS: ORAL_TABLET | ORAL | 0 refills | Status: DC

## 2022-11-08 NOTE — Progress Notes (Signed)
Subjective:    Patient ID: Jamie Harding, female    DOB: [birth year], 66 y.o.   MRN: [id number]  Diabetes She presents for her follow-up diabetic visit. She has type 2 diabetes mellitus. Current diabetic treatments: metformin, levemir, novolog.   Patient for blood pressure check up.  The patient does have hypertension.   Patient relates dietary measures try to minimize salt The importance of healthy diet and activity were discussed Patient relates compliance  The patient was seen today as part of a comprehensive diabetic check up. Patient has diabetes Patient relates good compliance with taking the medication. We discussed their diet and exercise activities  We also discussed the importance of notifying us if any excessively high glucoses or low sugars.    Patient here for follow-up regarding cholesterol.    Patient relates taking medication on a regular basis Denies problems with medication Importance of dietary measures discussed Regular lab work regarding lipid and liver was checked and if needing additional labs was appropriately ordered  This patient was seen today for chronic pain  The medication list was reviewed and updated.  Location of Pain for which the patient has been treated with regarding narcotics: Burning pain discomfort in her low back and mid back radiates intermittently into the legs  Onset of this pain: Present for years   -Compliance with medication: Good compliance  - Number patient states they take daily: Takes 3 or 4 daily  -when was the last dose patient took?  Within the past 24 hours  The patient was advised the importance of maintaining medication and not using illegal substances with these.  Here for refills and follow up  The patient was educated that we can provide 3 monthly scripts for their medication, it is their responsibility to follow the instructions.  Side effects or complications from medications: None  Patient is aware that  pain medications are meant to minimize the severity of the pain to allow their pain levels to improve to allow for better function. They are aware of that pain medications cannot totally remove their pain.  Due for UDT ( at least once per year) : Due today  Scale of 1 to 10 ( 1 is least 10 is most) Your pain level without the medicine: 8 Your pain level with medication 7  Scale 1 to 10 ( 1-helps very little, 10 helps very well) How well does your pain medication reduce your pain so you can function better through out the day? 8  Quality of the pain: Throbbing aching discomfort  Persistence of the pain: Present all the time  Modifying factors: Worse with activity  Type 2 diabetes mellitus with vascular disease ([place])  Essential hypertension  Hyperlipidemia associated with type 2 diabetes mellitus (HCC)  Chronic pain syndrome - Plan: ToxASSURE Select 13 (MW), Urine, HYDROcodone-acetaminophen (NORCO) 7.5-325 MG tablet, HYDROcodone-acetaminophen (NORCO) 7.5-325 MG tablet, HYDROcodone-acetaminophen (NORCO) 7.5-325 MG tablet         Review of Systems     Objective:   Physical Exam  General-in no acute distress Eyes-no discharge Lungs-respiratory rate normal, CTA CV-no murmurs,RRR Extremities skin warm dry no edema Neuro grossly normal Behavior normal, alert  Patient will do lab work in mid January     [place]:  1. Type 2 diabetes mellitus with vascular disease ([place]) Diabetes under fair control could be under better control very important to do better with the diet send [name] some readings on a regular basis look into the possibility of  getting onto Dexcom or on freestyle libre  2. Essential hypertension HTN- patient seen for follow-up regarding HTN.   Diet, medication compliance, appropriate labs and refills were completed.   Importance of keeping blood pressure under good control to lessen the risk of complications discussed Regular follow-up visits  discussed   3. Hyperlipidemia associated with type 2 diabetes mellitus (HCC) Hyperlipidemia-importance of diet, weight control, activity, compliance with medications discussed.   Recent labs reviewed.   Any additional labs or refills ordered.   Importance of keeping under good control discussed. Regular follow-up visits discussed   4. Chronic pain syndrome The patient was seen in followup for chronic pain. A review over at their current pain status was discussed. Drug registry was checked. Prescriptions were given.  Regular follow-up recommended. Discussion was held regarding the importance of compliance with medication as well as pain medication contract.  Patient was informed that medication may cause drowsiness and should not be combined  with other medications/alcohol or street drugs. If the patient feels medication is causing altered alertness then do not drive or operate dangerous equipment.  Should be noted that the patient appears to be meeting appropriate use of opioids and response.  Evidenced by improved function and decent pain control without significant side effects and no evidence of overt aberrancy issues.  Upon discussion with the patient today they understand that opioid therapy is optional and they feel that the pain has been refractory to reasonable conservative measures and is significant and affecting quality of life enough to warrant ongoing therapy and wishes to continue opioids.  Refills were provided. She does benefit from the medicine NSAIDs not effective Further physical therapy or surgery not indicated currently - ToxASSURE Select 13 (MW), Urine - HYDROcodone-acetaminophen (NORCO) 7.5-325 MG tablet; Take 1 tablet by mouth every 6 (six) hours as needed for moderate pain.  Dispense: 120 tablet; Refill: 0 - HYDROcodone-acetaminophen (NORCO) 7.5-325 MG tablet; Take 1 tablet by mouth every 6 (six) hours as needed for moderate pain.  Dispense: 120 tablet; Refill: 0 -  HYDROcodone-acetaminophen (NORCO) 7.5-325 MG tablet; Take 1 tablet by mouth every 6 (six) hours as needed for moderate pain.  Dispense: 120 tablet; Refill: 0  Follow-up by 3 months

## 2022-11-09 NOTE — Progress Notes (Signed)
Parachute order placed 11/09/22

## 2022-11-09 NOTE — Progress Notes (Signed)
11/09/22-will attempt to get CGM through [facility]

## 2022-11-13 LAB — TOXASSURE SELECT 13 (MW), URINE

## 2022-11-28 ENCOUNTER — Encounter: Payer: Self-pay | Admitting: Gastroenterology

## 2022-11-28 ENCOUNTER — Ambulatory Visit (INDEPENDENT_AMBULATORY_CARE_PROVIDER_SITE_OTHER): Payer: HMO | Admitting: Gastroenterology

## 2022-11-28 VITALS — BP 120/80 | HR 73 | Temp 98.2°F | Ht 62.0 in | Wt 198.8 lb

## 2022-11-28 DIAGNOSIS — Z86010 Personal history of colonic polyps: Secondary | ICD-10-CM

## 2022-11-28 DIAGNOSIS — K5904 Chronic idiopathic constipation: Secondary | ICD-10-CM | POA: Diagnosis not present

## 2022-11-28 DIAGNOSIS — K3184 Gastroparesis: Secondary | ICD-10-CM

## 2022-11-28 DIAGNOSIS — K5900 Constipation, unspecified: Secondary | ICD-10-CM | POA: Insufficient documentation

## 2022-11-28 DIAGNOSIS — K219 Gastro-esophageal reflux disease without esophagitis: Secondary | ICD-10-CM

## 2022-11-28 MED ORDER — MOTEGRITY 2 MG PO TABS: 2.0000 mg | ORAL_TABLET | Freq: Every day | ORAL | 3 refills | Status: DC

## 2022-11-28 NOTE — Progress Notes (Signed)
GI Office Note    Referring Provider: Kathyrn Drown, MD Primary Care Physician:  Kathyrn Drown, MD  Primary Gastroenterologist: Elon Alas. Abbey Chatters, DO   Chief Complaint   Chief Complaint  Patient presents with   Follow-up    Still has issues with constipation from time to time.     History of Present Illness   Jamie Harding is a 67 y.o. female presenting today for follow up. Last seen in 08/2022. She has h/o GERD, chronic nausea with vomiting, gastroparesis (abnormal GES 2021), constipation, h/o colon polyps.   She had incomplete colonoscopy 09/2022, bowel prep was poor. She will need extended bowel prep with next colonoscopy, to be complete between now and 03/2023. She completed trilyte prep before as per instructions.   Overall she is doing ok. She still has a lot of issues with constipation. Her last BM was 8 days ago. She uses miralax daily. Takes fiber pills daily. MOM as needed, helps. No melena, brbpr. Stools and gas are malodorous. She uses Bentyl for cramps with relief. Controls her gastroparesis with small meals. Her weight is stable. Rarely has heartburn. Vomiting less frequent than before, usually knows she will vomit when her epigastric region is full/hard. Previously not interested in trying reglan.    Colonoscopy 09/2022: - Diverticulosis in the sigmoid colon. - One 5 mm polyp in the cecum, removed with a cold snare. Resected and retrieved. - Stool in the sigmoid colon, in the descending colon and in the transverse colon. -tubular adenoma -colonoscopy in 3-6 months.  Medications   Current Outpatient Medications  Medication Sig Dispense Refill   albuterol (VENTOLIN HFA) 108 (90 Base) MCG/ACT inhaler INHALE 2 PUFFS INTO THE LUNGS EVERY 4 (FOUR) HOURS AS NEEDED FOR WHEEZING. 54 g 5   amLODipine (NORVASC) 2.5 MG tablet Take 1 tablet (2.5 mg total) by mouth daily. 90 tablet 1   blood glucose meter kit and supplies Dispense based on patient and insurance  preference. Pt checking sugars four times a day . (FOR ICD-10 E10.9, E11.9). 1 each 0   busPIRone (BUSPAR) 10 MG tablet Take 1 tablet BID PRN 60 tablet 5   cetirizine (ZYRTEC) 10 MG tablet TAKE ONE TABLET BY MOUTH ONCE DAILY. 90 tablet 0   clopidogrel (PLAVIX) 75 MG tablet Take 1 tablet (75 mg total) by mouth daily with breakfast. 90 tablet 3   dicyclomine (BENTYL) 10 MG capsule Take 1 capsule (10 mg total) by mouth 3 (three) times daily as needed (for abdominal cramping or diarrhea). 90 capsule 0   fluticasone (FLONASE) 50 MCG/ACT nasal spray Place 2 sprays into both nostrils daily. 48 g 3   glucose blood (ONETOUCH VERIO) test strip USE AS DIRECTED TO TEST BLOOD GLUCOSE 4 TIMES DAILY. DX E 11.9 200 strip 0   HYDROcodone-acetaminophen (NORCO) 7.5-325 MG tablet Take 1 tablet by mouth every 6 (six) hours as needed for moderate pain. 120 tablet 0   HYDROcodone-acetaminophen (NORCO) 7.5-325 MG tablet Take 1 tablet by mouth every 6 (six) hours as needed for moderate pain. 120 tablet 0   HYDROcodone-acetaminophen (NORCO) 7.5-325 MG tablet Take 1 tablet by mouth every 6 (six) hours as needed for moderate pain. 120 tablet 0   insulin detemir (LEVEMIR) 100 UNIT/ML injection Inject 72 units in the skin in the morning and 72 units into skin qhs- may titrate to 85 units BID 15 mL 5   lisinopril (ZESTRIL) 10 MG tablet Take 1 tablet (10 mg total) by mouth  daily. 90 tablet 1   metFORMIN (GLUCOPHAGE) 1000 MG tablet TAKE ONE TABLET BY MOUTH TWO TIMES A DAY. 180 tablet 1   Multiple Vitamin (MULTIVITAMIN WITH MINERALS) TABS Take 1 tablet by mouth daily.     NOVOLOG 100 UNIT/ML injection Inject 10 units into skin with breakfast and 10 units into skin at supper 10 mL 5   ondansetron (ZOFRAN) 8 MG tablet Take one tablet by mouth every 8 hours as needed for nausea/vomiting. 60 tablet 2   pantoprazole (PROTONIX) 40 MG tablet TAKE (1) TABLET BY MOUTH TWICE A DAY BEFORE A MEAL. 180 tablet 3   promethazine (PHENERGAN) 25 MG  tablet TAKE ONE TABLET BY MOUTH EVERY EIGHT HOURS AS NEEDED FOR NAUSEA 30 tablet 5   rosuvastatin (CRESTOR) 20 MG tablet Take 1 tablet (20 mg total) by mouth daily. 90 tablet 1   venlafaxine (EFFEXOR) 75 MG tablet TAKE (1) TABLET BY MOUTH 3 TIMES DAILY 270 tablet 0   No current facility-administered medications for this visit.    Allergies   Allergies as of 11/28/2022 - Review Complete 11/28/2022  Allergen Reaction Noted   [place] 10 mcg pen [exenatide] Diarrhea and Nausea And Vomiting 02/02/2013   Naproxen Nausea And Vomiting 07/15/2009   Pollen extract  05/05/2020   Augmentin [amoxicillin-pot clavulanate]  03/16/2014   Azithromycin Nausea And Vomiting and Rash 07/15/2009   Erythromycin Hives 02/02/2013   Invokana [canagliflozin]  09/13/2014   Morphine Hives and Rash    Sulfonamide derivatives Nausea And Vomiting and Rash       Review of Systems   General: Negative for anorexia, weight loss, fever, chills, fatigue, weakness. ENT: Negative for hoarseness, difficulty swallowing , nasal congestion. CV: Negative for chest pain, angina, palpitations, dyspnea on exertion, peripheral edema.  Respiratory: Negative for dyspnea at rest, dyspnea on exertion, cough, sputum, wheezing.  GI: See history of present illness. GU:  Negative for dysuria, hematuria, urinary incontinence, urinary frequency, nocturnal urination.  Endo: Negative for unusual weight change.     Physical Exam   BP 120/80 (BP Location: Right Arm, Patient Position: Sitting, Cuff Size: Large)   Pulse 73   Temp 98.2 F (36.8 C) (Oral)   Ht '5\' 2"'$  (1.575 m)   Wt 198 lb 12.8 oz (90.2 kg)   SpO2 98%   BMI 36.36 kg/m    General: Well-nourished, well-developed in no acute distress.  Eyes: No icterus. Mouth: Oropharyngeal mucosa moist and pink  Abdomen: Bowel sounds are normal, nontender, nondistended, no hepatosplenomegaly or masses,  no abdominal bruits or hernia , no rebound or guarding.  Extremities: No lower  extremity edema. No clubbing or deformities. Neuro: Alert and oriented x 4   Skin: Warm and dry, no jaundice.   Psych: Alert and cooperative, normal mood and affect.  Labs   Lab Results  Component Value Date   CREATININE 1.20 (H) 08/22/2022   BUN 23 08/22/2022   NA 138 08/22/2022   K 4.4 08/22/2022   CL 101 08/22/2022   CO2 28 08/22/2022   Lab Results  Component Value Date   WBC 8.6 04/25/2022   HGB 13.7 04/25/2022   HCT 40.9 04/25/2022   MCV 86.5 04/25/2022   PLT 347 04/25/2022   Lab Results  Component Value Date   ALT 14 04/25/2022   AST 19 04/25/2022   ALKPHOS 54 04/25/2022   BILITOT 0.4 04/25/2022   Lab Results  Component Value Date   HGBA1C 8.1 (H) 08/22/2022    Imaging Studies  No results found.  Assessment   GERD: controlled on pantoprazole  Gastroparesis: controlled with small meals  Constipation: poorly controlled. Add Motegrity  H/O adenomatous colon polyps: will need colonoscopy before 03/2023, due to poor bowel prep   PLAN   Start Motegrity '2mg'$  daily for constipation. Can continue bentyl up to 3 times daily for cramping but hold in setting of constipation.  Return ov in six weeks. Schedule colonoscopy at that time if constipation controlled.    Laureen Ochs. Bobby Rumpf, [place], [facility]

## 2022-11-28 NOTE — Patient Instructions (Signed)
Start Motegrity '2mg'$  daily for constipation.  You can continue to use dicyclomine up to 3 times daily as needed for cramping but hold for constipation.  Return office visit in six weeks. Call sooner if constipation not well controlled.  We will schedule colonoscopy after next office visit.

## 2022-12-10 ENCOUNTER — Encounter: Payer: Self-pay | Admitting: *Deleted

## 2023-01-05 ENCOUNTER — Other Ambulatory Visit: Payer: Self-pay | Admitting: Family Medicine

## 2023-01-08 NOTE — Progress Notes (Unsigned)
GI Office Note    Referring Provider: Kathyrn Drown, MD Primary Care Physician:  Kathyrn Drown, MD  Primary Gastroenterologist:  Chief Complaint   No chief complaint on file.   History of Present Illness   Jamie Harding is a 67 y.o. female presenting today for follow up. Last seen 11/2022. She has h/o GERD, chronic nausea with vomiting, gastroparesis (abnormal GES 2021), constipation, h/o colon polyps.    She had incomplete colonoscopy 09/2022, bowel prep was poor. She will need extended bowel prep with next colonoscopy, to be complete between now and 03/2023. She completed trilyte prep before as per instructions.   Started Motegrity 33m daily.   Colonoscopy 09/2022: - Diverticulosis in the sigmoid colon. - One 5 mm polyp in the cecum, removed with a cold snare. Resected and retrieved. - Stool in the sigmoid colon, in the descending colon and in the transverse colon. -tubular adenoma -colonoscopy in 3-6 months.    Medications   Current Outpatient Medications  Medication Sig Dispense Refill   albuterol (VENTOLIN HFA) 108 (90 Base) MCG/ACT inhaler INHALE 2 PUFFS INTO THE LUNGS EVERY 4 (FOUR) HOURS AS NEEDED FOR WHEEZING. 54 g 5   amLODipine (NORVASC) 2.5 MG tablet Take 1 tablet (2.5 mg total) by mouth daily. 90 tablet 1   blood glucose meter kit and supplies Dispense based on patient and insurance preference. Pt checking sugars four times a day . (FOR ICD-10 E10.9, E11.9). 1 each 0   busPIRone (BUSPAR) 10 MG tablet Take 1 tablet BID PRN 60 tablet 5   cetirizine (ZYRTEC) 10 MG tablet TAKE ONE TABLET BY MOUTH ONCE DAILY. 90 tablet 0   clopidogrel (PLAVIX) 75 MG tablet Take 1 tablet (75 mg total) by mouth daily with breakfast. 90 tablet 3   dicyclomine (BENTYL) 10 MG capsule Take 1 capsule (10 mg total) by mouth 3 (three) times daily as needed (for abdominal cramping or diarrhea). 90 capsule 0   fluticasone (FLONASE) 50 MCG/ACT nasal spray Place 2 sprays into both  nostrils daily. 48 g 3   glucose blood (ONETOUCH VERIO) test strip USE AS DIRECTED TO TEST BLOOD GLUCOSE 4 TIMES DAILY. DX E 11.9 200 strip 0   HYDROcodone-acetaminophen (NORCO) 7.5-325 MG tablet Take 1 tablet by mouth every 6 (six) hours as needed for moderate pain. 120 tablet 0   HYDROcodone-acetaminophen (NORCO) 7.5-325 MG tablet Take 1 tablet by mouth every 6 (six) hours as needed for moderate pain. 120 tablet 0   HYDROcodone-acetaminophen (NORCO) 7.5-325 MG tablet Take 1 tablet by mouth every 6 (six) hours as needed for moderate pain. 120 tablet 0   insulin detemir (LEVEMIR) 100 UNIT/ML injection Inject 72 units in the skin in the morning and 72 units into skin qhs- may titrate to 85 units BID 15 mL 5   lisinopril (ZESTRIL) 10 MG tablet Take 1 tablet (10 mg total) by mouth daily. 90 tablet 1   metFORMIN (GLUCOPHAGE) 1000 MG tablet TAKE ONE TABLET BY MOUTH TWO TIMES A DAY. 180 tablet 1   Multiple Vitamin (MULTIVITAMIN WITH MINERALS) TABS Take 1 tablet by mouth daily.     NOVOLOG 100 UNIT/ML injection Inject 10 units into skin with breakfast and 10 units into skin at supper 10 mL 5   ondansetron (ZOFRAN) 8 MG tablet Take one tablet by mouth every 8 hours as needed for nausea/vomiting. 60 tablet 2   pantoprazole (PROTONIX) 40 MG tablet TAKE (1) TABLET BY MOUTH TWICE A DAY BEFORE A  MEAL. 180 tablet 3   promethazine (PHENERGAN) 25 MG tablet TAKE ONE TABLET BY MOUTH EVERY EIGHT HOURS AS NEEDED FOR NAUSEA 30 tablet 5   Prucalopride Succinate (MOTEGRITY) 2 MG TABS Take 1 tablet (2 mg total) by mouth daily. 30 tablet 3   rosuvastatin (CRESTOR) 20 MG tablet Take 1 tablet (20 mg total) by mouth daily. 90 tablet 1   venlafaxine (EFFEXOR) 75 MG tablet TAKE (1) TABLET BY MOUTH 3 TIMES DAILY 270 tablet 0   No current facility-administered medications for this visit.    Allergies   Allergies as of 01/09/2023 - Review Complete 11/28/2022  Allergen Reaction Noted   [place]etta 10 mcg pen [exenatide] Diarrhea  and Nausea And Vomiting 02/02/2013   Naproxen Nausea And Vomiting 07/15/2009   Pollen extract  05/05/2020   Augmentin [amoxicillin-pot clavulanate]  03/16/2014   Azithromycin Nausea And Vomiting and Rash 07/15/2009   Erythromycin Hives 02/02/2013   Invokana [canagliflozin]  09/13/2014   Morphine Hives and Rash    Sulfonamide derivatives Nausea And Vomiting and Rash      Past Medical History   Past Medical History:  Diagnosis Date   Anxiety    Asthma    Depression    Essential hypertension    Fatty liver    Gastritis    Gastroparesis    GERD (gastroesophageal reflux disease)    Hiatal hernia    History of cardiac catheterization    Minimal coronary atherosclerosis February 2016   History of kidney stones    History of stroke 2008   HLD (hyperlipidemia)    Neuropathy    Obesity    Pancreatitis    Rheumatoid arthritis(714.0)    Stroke ([place]) 2008   Stroke ([place]) 01/2020   Tibialis tendinitis    Type 2 diabetes mellitus ([name])     Past Surgical History   Past Surgical History:  Procedure Laterality Date   ANKLE SURGERY     remove extra bone-left   BIOPSY  04/25/2021   Procedure: BIOPSY;  Surgeon: Eloise Harman, DO;  [facility];  Service: Endoscopy;;   CARPAL TUNNEL RELEASE     right side   CARPAL TUNNEL RELEASE Left 02/06/2013   Procedure: CARPAL TUNNEL RELEASE ;  Surgeon: Carole Civil, MD;  Location: [facility];  Service: Orthopedics;  Laterality: Left;  procedure end [place] Right 09/14/2015   Procedure: RIGHT CARPAL TUNNEL RELEASE;  Surgeon: Carole Civil, MD;  Location: [facility];  Service: Orthopedics;  Laterality: Right;   CARPAL TUNNEL RELEASE Left 09/28/2015   Procedure: LEFT CARPAL TUNNEL RELEASE;  Surgeon: Carole Civil, MD;  Location: [facility];  Service: Orthopedics;  Laterality: Left;  procedure 1   CESAREAN SECTION     CHOLECYSTECTOMY     COLONOSCOPY WITH PROPOFOL  09/16/2012   [id number] sessile polyps ranging  between 3-34m in size were found in the sigmoid colon and rectum (hyperplastic)/Mild diverticulosis was noted throughout the entire examined colon/Small internal hemorrhoids. Repeat in 10 years.   COLONOSCOPY WITH PROPOFOL N/A 09/24/2022   Procedure: COLONOSCOPY WITH PROPOFOL;  Surgeon: CEloise Harman DO;  [facility];  Service: Endoscopy;  Laterality: N/A;  12:15pm, asa 3   DILATATION & CURETTAGE/HYSTEROSCOPY WITH MYOSURE N/A 08/05/2019   Procedure: DILATATION & CURETTAGE/HYSTEROSCOPY WITH MYOSURE;  Surgeon: DSloan Leiter MD;  Location: [facility]  Service: Gynecology;  Laterality: N/A;  myosure rep will be here.  Confirmed on 07/30/19 CS  DILATION AND CURETTAGE OF UTERUS     multiple   ESOPHAGOGASTRODUODENOSCOPY  07/29/2009   Mild gastritis, benign path   ESOPHAGOGASTRODUODENOSCOPY (EGD) WITH PROPOFOL  09/16/2012   SLF:Non-erosive gastritis (inflammation) was found; multiple bx/The duodenal mucosa showed no abnormalities in the ampulla and bulb and second portion of the duodenum/NAUSEA/VOMITING MOST LIKELY DUE TO GERD/GASTRITIS   ESOPHAGOGASTRODUODENOSCOPY (EGD) WITH PROPOFOL N/A 04/25/2021   Surgeon: Eloise Harman, DO; Gastritis biopsied, normal examined duodenum biopsied.  Pathology revealed focal chronic gastritis, negative for H. Pylori, benign small bowel mucosa.   FINGER SURGERY Left    left little finger-otif of finger   Gastric Emptying  07/27/2009   The amount of activity in the stomach at 120 minutes was 13% which is in the normal range   LASER ABLATION OF THE CERVIX     LEFT AND RIGHT HEART CATHETERIZATION WITH CORONARY ANGIOGRAM N/A 01/03/2015   Procedure: LEFT AND RIGHT HEART CATHETERIZATION WITH CORONARY ANGIOGRAM;  Surgeon: Blane Ohara, MD;  Location: [facility] CATH LAB;  Service: Cardiovascular;  Laterality: N/A;   LITHOTRIPSY     POLYPECTOMY  09/16/2012   Procedure: POLYPECTOMY;  Surgeon: Danie Binder, MD;  Location: [facility];  Service:  Endoscopy;  Laterality: N/A;   POLYPECTOMY  09/24/2022   Procedure: POLYPECTOMY;  Surgeon: Eloise Harman, DO;  [facility];  Service: Endoscopy;;   SAVORY DILATION  09/16/2012   Procedure: SAVORY DILATION;  Surgeon: Danie Binder, MD;  Location: [facility];  Service: Endoscopy;  Laterality: N/A;  16 fr dilation   TRIGGER FINGER RELEASE Left 02/06/2013   Procedure: RELEASE TRIGGER FINGER LEFT LONG FINGER/A-1 PULLEY;  Surgeon: Carole Civil, MD;  Location: [facility];  Service: Orthopedics;  Laterality: Left;  procedure began [place] Right 09/14/2015   Procedure: RIGHT LONG FINGER TRIGGER RELEASE;  Surgeon: Carole Civil, MD;  Location[facility];  Service: Orthopedics;  Laterality: Right;   TRIGGER FINGER RELEASE Left 09/28/2015   Procedure: LEFT RING TRIGGER FINGER RELEASE;  Surgeon: Carole Civil, MD;  Location: [facility];  Service: Orthopedics;  Laterality: Left;  left ring finger   TUBAL LIGATION      Past Family History   Family History  Problem Relation Age of Onset   Breast cancer Mother    Diabetes Father    Coronary artery disease Other    Arthritis Other    Asthma Other    Diabetes Sister    Colon cancer Neg Hx     Past Social History   Social History   Socioeconomic History   Marital status: Married    Spouse name: Not on file   Number of children: Not on file   Years of education: Not on file   Highest education level: Not on file  Occupational History   Occupation: nurse tech    Comment: [employer], 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)    Employer: [employer]  Tobacco Use   Smoking status: Former    Packs/day: 0.50    Years: 3.00    Total pack years: 1.50    Types: Cigarettes    Quit date: 09/11/1987    Years since quitting: 35.3   Smokeless tobacco: Never  Substance and Sexual Activity   Alcohol use: No   Drug use: No   Sexual activity: Not Currently    Birth control/protection: Surgical  Other Topics Concern    Not on file  Social History Narrative   Not  on file   Social Determinants of Health   Financial Resource Strain: Low Risk  (05/02/2021)   Overall Financial Resource Strain (CARDIA)    Difficulty of Paying Living Expenses: Not hard at all  Food Insecurity: No Food Insecurity (05/02/2021)   Hunger Vital Sign    Worried About Running Out of Food in the Last Year: Never true    Ran Out of Food in the Last Year: Never true  Transportation Needs: No Transportation Needs (05/02/2021)   PRAPARE - Hydrologist (Medical): No    Lack of Transportation (Non-Medical): No  Physical Activity: Insufficiently Active (05/02/2021)   Exercise Vital Sign    Days of Exercise per Week: 2 days    Minutes of Exercise per Session: 20 min  Stress: No Stress Concern Present (05/02/2021)   [place]    Feeling of Stress : Not at all  Social Connections: Moderately Isolated (05/02/2021)   Social Connection and Isolation Panel [NHANES]    Frequency of Communication with Friends and Family: Twice a week    Frequency of Social Gatherings with Friends and Family: More than three times a week    Attends Religious Services: Never    Marine scientist or Organizations: No    Attends Archivist Meetings: Never    Marital Status: Married  Human resources officer Violence: Not At Risk (05/02/2021)   Humiliation, Afraid, Rape, and Kick questionnaire    Fear of Current or Ex-Partner: No    Emotionally Abused: No    Physically Abused: No    Sexually Abused: No    Review of Systems   General: Negative for anorexia, weight loss, fever, chills, fatigue, weakness. ENT: Negative for hoarseness, difficulty swallowing , nasal congestion. CV: Negative for chest pain, angina, palpitations, dyspnea on exertion, peripheral edema.  Respiratory: Negative for dyspnea at rest, dyspnea on exertion, cough, sputum, wheezing.  GI: See  history of present illness. GU:  Negative for dysuria, hematuria, urinary incontinence, urinary frequency, nocturnal urination.  Endo: Negative for unusual weight change.     Physical Exam   There were no vitals taken for this visit.   General: Well-nourished, well-developed in no acute distress.  Eyes: No icterus. Mouth: Oropharyngeal mucosa moist and pink , no lesions erythema or exudate. Lungs: Clear to auscultation bilaterally.  Heart: Regular rate and rhythm, no murmurs rubs or gallops.  Abdomen: Bowel sounds are normal, nontender, nondistended, no hepatosplenomegaly or masses,  no abdominal bruits or hernia , no rebound or guarding.  Rectal: ***  Extremities: No lower extremity edema. No clubbing or deformities. Neuro: Alert and oriented x 4   Skin: Warm and dry, no jaundice.   Psych: Alert and cooperative, normal mood and affect.  Labs   *** Imaging Studies   No results found.  Assessment       PLAN   ***   Laureen Ochs. Bobby Rumpf, [place], [facility]

## 2023-01-09 ENCOUNTER — Ambulatory Visit (INDEPENDENT_AMBULATORY_CARE_PROVIDER_SITE_OTHER): Payer: HMO | Admitting: Gastroenterology

## 2023-01-09 ENCOUNTER — Encounter: Payer: Self-pay | Admitting: Gastroenterology

## 2023-01-09 VITALS — BP 115/76 | HR 79 | Temp 97.9°F | Ht 62.0 in | Wt 196.4 lb

## 2023-01-09 DIAGNOSIS — K5904 Chronic idiopathic constipation: Secondary | ICD-10-CM | POA: Diagnosis not present

## 2023-01-09 DIAGNOSIS — K219 Gastro-esophageal reflux disease without esophagitis: Secondary | ICD-10-CM | POA: Diagnosis not present

## 2023-01-09 DIAGNOSIS — Z86010 Personal history of colonic polyps: Secondary | ICD-10-CM | POA: Diagnosis not present

## 2023-01-09 DIAGNOSIS — K3184 Gastroparesis: Secondary | ICD-10-CM

## 2023-01-09 MED ORDER — NALOXEGOL OXALATE 25 MG PO TABS: 25.0000 mg | ORAL_TABLET | Freq: Every day | ORAL | 3 refills | Status: DC

## 2023-01-09 NOTE — Patient Instructions (Signed)
Continue Motegrity daily. I have sent in RX for Movantik 43m daily. Take one hour before or 2 hours after a meal. Call if not affordable. I could not tell if it is on your insurance formulary.  Please let me know in the next 1-2 weeks how your bowel movements are doing.  Continue zofran and/or phenergan as needed for nausea.

## 2023-01-10 ENCOUNTER — Telehealth: Payer: Self-pay

## 2023-01-10 MED ORDER — LINACLOTIDE 290 MCG PO CAPS: 290.0000 ug | ORAL_CAPSULE | Freq: Every day | ORAL | 3 refills | Status: DC

## 2023-01-10 NOTE — Telephone Encounter (Signed)
Movantik is not on the patient's formulary. Her insurance is requiring her to try and fail the following: Lactulose, Linzess, and Amitiza.

## 2023-01-10 NOTE — Telephone Encounter (Signed)
Pt was made aware and verbalized understanding.  

## 2023-01-10 NOTE — Addendum Note (Signed)
Addended by: Mahala Menghini on: 01/10/2023 01:14 PM   Modules accepted: Orders

## 2023-01-10 NOTE — Telephone Encounter (Signed)
I did not want to mix Motegrity and Linzess or Amitiza.   So let's have her stop Motegrity. Start Linzess 290. I will send in RX. Please let pt know.

## 2023-01-13 ENCOUNTER — Encounter: Payer: Self-pay | Admitting: Family Medicine

## 2023-01-17 ENCOUNTER — Encounter: Payer: Self-pay | Admitting: Radiology

## 2023-01-22 ENCOUNTER — Ambulatory Visit (INDEPENDENT_AMBULATORY_CARE_PROVIDER_SITE_OTHER): Payer: HMO | Admitting: Family Medicine

## 2023-01-22 VITALS — BP 138/83 | HR 86 | Wt 192.8 lb

## 2023-01-22 DIAGNOSIS — M25552 Pain in left hip: Secondary | ICD-10-CM | POA: Diagnosis not present

## 2023-01-22 DIAGNOSIS — M5440 Lumbago with sciatica, unspecified side: Secondary | ICD-10-CM | POA: Diagnosis not present

## 2023-01-22 MED ORDER — HYDROCODONE-ACETAMINOPHEN 10-325 MG PO TABS: ORAL_TABLET | ORAL | 0 refills | Status: DC

## 2023-01-22 NOTE — Progress Notes (Signed)
   Subjective:    Patient ID: Jamie Harding, female    DOB: November 17, 1956, 67 y.o.   MRN: [id number]  HPI Patient arrives today with back pain.  Increased pain discomfort in her back radiates into the hips sometimes down the left leg described severe pain makes it very difficult for her to do housework and playing with her grandchildren relates a lot of pressure discomfort has tried stretches has tried pain medication cannot take additional medicines with her other health issues Denies any injury     Review of Systems     Objective:   Physical Exam Lungs clear heart regular low back subjective discomfort positive straight leg raise on the left strength in both legs good       Assessment & Plan:  Significant lumbar pain Proceed forward with lumbar images also physical therapy continue pain medicine patient already has a regular follow-up visit near future patient was encouraged to get her lab results and do her labs before next follow-up visit hold off on MRI currently

## 2023-01-24 ENCOUNTER — Telehealth: Payer: Self-pay

## 2023-01-24 NOTE — Telephone Encounter (Signed)
Spoke with patient and notified her that her prescription is waiting at the front desk for her ready for pick up.

## 2023-01-25 ENCOUNTER — Ambulatory Visit (HOSPITAL_COMMUNITY)
Admission: RE | Admit: 2023-01-25 | Discharge: 2023-01-25 | Disposition: A | Discharge status: Home / Self Care | Payer: PPO | Source: Ambulatory Visit | Attending: Family Medicine | Admitting: Family Medicine

## 2023-01-25 DIAGNOSIS — M545 Low back pain, unspecified: Secondary | ICD-10-CM | POA: Diagnosis not present

## 2023-01-25 DIAGNOSIS — M5440 Lumbago with sciatica, unspecified side: Secondary | ICD-10-CM | POA: Diagnosis not present

## 2023-01-25 DIAGNOSIS — M25552 Pain in left hip: Secondary | ICD-10-CM | POA: Insufficient documentation

## 2023-01-30 ENCOUNTER — Other Ambulatory Visit: Payer: Self-pay | Admitting: Gastroenterology

## 2023-01-30 ENCOUNTER — Other Ambulatory Visit: Payer: Self-pay | Admitting: Family Medicine

## 2023-01-30 DIAGNOSIS — K3184 Gastroparesis: Secondary | ICD-10-CM

## 2023-02-04 DIAGNOSIS — E113293 Type 2 diabetes mellitus with mild nonproliferative diabetic retinopathy without macular edema, bilateral: Secondary | ICD-10-CM | POA: Diagnosis not present

## 2023-02-04 LAB — HM DIABETES EYE EXAM

## 2023-02-05 DIAGNOSIS — E1159 Type 2 diabetes mellitus with other circulatory complications: Secondary | ICD-10-CM | POA: Diagnosis not present

## 2023-02-05 DIAGNOSIS — E7849 Other hyperlipidemia: Secondary | ICD-10-CM | POA: Diagnosis not present

## 2023-02-06 LAB — BASIC METABOLIC PANEL
BUN/Creatinine Ratio: 12 (ref 12–28)
BUN: 13 mg/dL (ref 8–27)
CO2: 22 mmol/L (ref 20–29)
Calcium: 9.8 mg/dL (ref 8.7–10.3)
Chloride: 101 mmol/L (ref 96–106)
Creatinine, Ser: 1.05 mg/dL — ABNORMAL HIGH (ref 0.57–1.00)
Glucose: 183 mg/dL — ABNORMAL HIGH (ref 70–99)
Potassium: 4.4 mmol/L (ref 3.5–5.2)
Sodium: 141 mmol/L (ref 134–144)
eGFR: 59 mL/min/{1.73_m2} — ABNORMAL LOW (ref >59)

## 2023-02-06 LAB — HEMOGLOBIN A1C
Est. average glucose Bld gHb Est-mCnc: 189 mg/dL
Hgb A1c MFr Bld: 8.2 % — ABNORMAL HIGH (ref 4.8–5.6)

## 2023-02-06 LAB — LIPID PANEL
Chol/HDL Ratio: 2.9 ratio (ref 0.0–4.4)
Cholesterol, Total: 153 mg/dL (ref 100–199)
HDL: 53 mg/dL (ref >39)
LDL Chol Calc (NIH): 75 mg/dL (ref 0–99)
Triglycerides: 144 mg/dL (ref 0–149)
VLDL Cholesterol Cal: 25 mg/dL (ref 5–40)

## 2023-02-06 LAB — MICROALBUMIN / CREATININE URINE RATIO
Creatinine, Urine: 216.6 mg/dL
Microalb/Creat Ratio: 82 mg/g creat — ABNORMAL HIGH (ref 0–29)
Microalbumin, Urine: 177.4 ug/mL

## 2023-02-07 ENCOUNTER — Ambulatory Visit: Payer: HMO | Admitting: Family Medicine

## 2023-02-12 ENCOUNTER — Ambulatory Visit (INDEPENDENT_AMBULATORY_CARE_PROVIDER_SITE_OTHER): Payer: HMO | Admitting: Family Medicine

## 2023-02-12 VITALS — BP 110/70 | HR 79 | Ht 62.0 in | Wt 190.8 lb

## 2023-02-12 DIAGNOSIS — R42 Dizziness and giddiness: Secondary | ICD-10-CM | POA: Diagnosis not present

## 2023-02-12 DIAGNOSIS — M5440 Lumbago with sciatica, unspecified side: Secondary | ICD-10-CM | POA: Diagnosis not present

## 2023-02-12 DIAGNOSIS — E785 Hyperlipidemia, unspecified: Secondary | ICD-10-CM | POA: Diagnosis not present

## 2023-02-12 DIAGNOSIS — M25579 Pain in unspecified ankle and joints of unspecified foot: Secondary | ICD-10-CM

## 2023-02-12 DIAGNOSIS — E1169 Type 2 diabetes mellitus with other specified complication: Secondary | ICD-10-CM

## 2023-02-12 MED ORDER — HYDROCODONE-ACETAMINOPHEN 10-325 MG PO TABS: ORAL_TABLET | ORAL | 0 refills | Status: DC

## 2023-02-12 MED ORDER — LISINOPRIL 5 MG PO TABS: 5.0000 mg | ORAL_TABLET | Freq: Every day | ORAL | 1 refills | Status: DC

## 2023-02-12 MED ORDER — FLUTICASONE PROPIONATE 50 MCG/ACT NA SUSP: 2.0000 | Freq: Every day | NASAL | 3 refills | Status: DC

## 2023-02-12 NOTE — Progress Notes (Unsigned)
Subjective:    Patient ID: Jamie Harding, female    DOB: 04/28/1956, 67 y.o.   MRN: [id number]  HPI This patient was seen today for chronic pain  The medication list was reviewed and updated.  Location of Pain for which the patient has been treated with regarding narcotics: Lumbar pain  Onset of this pain: Present for years   -Compliance with medication: Good compliance with medicine  - Number patient states they take daily: Approximately 3/day  -when was the last dose patient took? 0700 this morning  The patient was advised the importance of maintaining medication and not using illegal substances with these.  Here for refills and follow up  The patient was educated that we can provide 3 monthly scripts for their medication, it is their responsibility to follow the instructions.  Side effects or complications from medications: Denies side effects  Patient is aware that pain medications are meant to minimize the severity of the pain to allow their pain levels to improve to allow for better function. They are aware of that pain medications cannot totally remove their pain.  Due for UDT ( at least once per year) : 11/08/2022  Scale of 1 to 10 ( 1 is least 10 is most) Your pain level without the medicine: 8 Your pain level with medication 4  Scale 1 to 10 ( 1-helps very little, 10 helps very well) How well does your pain medication reduce your pain so you can function better through out the day? 7  Quality of the pain: Throbbing aching  Persistence of the pain: Present all the time  Modifying factors: Worse with activity  Dizzy  Sinus tarsi syndrome, unspecified laterality - Plan: fluticasone (FLONASE) 50 MCG/ACT nasal spray  Hyperlipidemia associated with type 2 diabetes mellitus (HCC)  Back pain of lumbar region with sciatica  Patient for blood pressure check up.  The patient does have hypertension.   Patient relates dietary measures try to minimize salt The  importance of healthy diet and activity were discussed Patient relates compliance  Patient here for follow-up regarding cholesterol.    Patient relates taking medication on a regular basis Denies problems with medication Importance of dietary measures discussed Regular lab work regarding lipid and liver was checked and if needing additional labs was appropriately ordered  The patient was seen today as part of a comprehensive diabetic check up. Patient has diabetes Patient relates good compliance with taking the medication. We discussed their diet and exercise activities  We also discussed the importance of notifying us if any excessively high glucoses or low sugars.         Review of Systems     Objective:   Physical Exam General-in no acute distress Eyes-no discharge Lungs-respiratory rate normal, CTA CV-no murmurs,RRR Extremities skin warm dry no edema Neuro grossly normal Behavior normal, alert        Assessment & Plan:  Allergic rhinitis Flonase as directed - fluticasone (FLONASE) 50 MCG/ACT nasal spray; Place 2 sprays into both nostrils daily.  Dispense: 48 g; Refill: 3  2. Dizzy Intermittent dizzy spells more than likely related to balance issues.  I do not find any evidence of any major ongoing issues but she is somewhat orthostatic so we will back down on blood pressure medicine  3. Hyperlipidemia associated with type 2 diabetes mellitus ([place]) Cholesterol decent control continue current measures  4. Back pain of lumbar region with sciatica Physical therapy referral   The patient was seen in followup  for chronic pain. A review over at their current pain status was discussed. Drug registry was checked. Prescriptions were given.  Regular follow-up recommended. Discussion was held regarding the importance of compliance with medication as well as pain medication contract.  Patient was informed that medication may cause drowsiness and should not be combined   with other medications/alcohol or street drugs. If the patient feels medication is causing altered alertness then do not drive or operate dangerous equipment.  Should be noted that the patient appears to be meeting appropriate use of opioids and response.  Evidenced by improved function and decent pain control without significant side effects and no evidence of overt aberrancy issues.  Upon discussion with the patient today they understand that opioid therapy is optional and they feel that the pain has been refractory to reasonable conservative measures and is significant and affecting quality of life enough to warrant ongoing therapy and wishes to continue opioids.  Refills were provided.  The patient was seen today as part of a comprehensive visit for diabetes. The importance of keeping her A1c at or below 7 range was discussed.  Discussed diet, activity, and medication compliance Emphasized healthy eating primarily with vegetables fruits and if utilizing meats lean meats such as chicken or fish grilled baked broiled Avoid sugary drinks Minimize and avoid processed foods Fit in regular physical activity preferably 25 to 30 minutes 4 times per week Standard follow-up visit recommended.  Patient aware lack of control and follow-up increases risk of diabetic complications. Regular follow-up visits Yearly ophthalmology Yearly foot exam  Hyperlipidemia-importance of diet, weight control, activity, compliance with medications discussed.   Recent labs reviewed.   Any additional labs or refills ordered.   Importance of keeping under good control discussed. Regular follow-up visits discussed

## 2023-02-15 ENCOUNTER — Encounter: Payer: Self-pay | Admitting: *Deleted

## 2023-02-18 ENCOUNTER — Other Ambulatory Visit: Payer: Self-pay | Admitting: *Deleted

## 2023-02-18 DIAGNOSIS — E1159 Type 2 diabetes mellitus with other circulatory complications: Secondary | ICD-10-CM

## 2023-02-19 ENCOUNTER — Telehealth: Payer: Self-pay | Admitting: Pharmacist

## 2023-02-19 NOTE — Progress Notes (Unsigned)
Contacted patient regarding referral for diabetes from Kathyrn Drown, MD .   Voicemail is full. Will send MyChart message.   Catie Hedwig Morton, PharmD, [place], [facility] [phone]

## 2023-02-20 NOTE — Progress Notes (Signed)
Attempted to contact patient regarding referral for diabetes from Kathyrn Drown, MD. Phone call went directly to VM and patient's mailbox was full.

## 2023-02-27 ENCOUNTER — Other Ambulatory Visit: Payer: Self-pay | Admitting: Family Medicine

## 2023-02-27 ENCOUNTER — Other Ambulatory Visit: Payer: Self-pay | Admitting: Gastroenterology

## 2023-02-27 ENCOUNTER — Encounter: Payer: Self-pay | Admitting: Family Medicine

## 2023-02-27 DIAGNOSIS — K219 Gastro-esophageal reflux disease without esophagitis: Secondary | ICD-10-CM

## 2023-02-27 DIAGNOSIS — K3184 Gastroparesis: Secondary | ICD-10-CM

## 2023-03-05 ENCOUNTER — Telehealth: Payer: Self-pay

## 2023-03-05 NOTE — Progress Notes (Signed)
   Care Guide Note  03/05/2023 Name: Jamie Harding MRN: [id number] DOB: August 31, 1956  Referred by: Babs Sciara, MD Reason for referral : Care Coordination (Outreach to schedule with Pharm d )   Jamie Harding is a 67 y.o. year old female who is a primary care patient of Luking, Jonna Coup, MD. Eduardo Osier Genest was referred to the pharmacist for assistance related to DM.    Successful contact was made with the patient to discuss pharmacy services including being ready for the pharmacist to call at least 5 minutes before the scheduled appointment time, to have medication bottles and any blood sugar or blood pressure readings ready for review. The patient agreed to meet with the pharmacist via with the pharmacist via telephone visit on (date/time).  03/13/2023  Penne Lash, RMA Care Guide [facility]  [place], [place] [address] Direct Dial: [phone] [email or web address]

## 2023-03-13 ENCOUNTER — Other Ambulatory Visit: Payer: PPO | Admitting: Pharmacist

## 2023-03-14 ENCOUNTER — Encounter: Payer: Self-pay | Admitting: *Deleted

## 2023-03-18 DIAGNOSIS — E663 Overweight: Secondary | ICD-10-CM | POA: Diagnosis not present

## 2023-03-18 DIAGNOSIS — R030 Elevated blood-pressure reading, without diagnosis of hypertension: Secondary | ICD-10-CM | POA: Diagnosis not present

## 2023-03-18 DIAGNOSIS — M5431 Sciatica, right side: Secondary | ICD-10-CM | POA: Diagnosis not present

## 2023-03-18 DIAGNOSIS — Z6829 Body mass index (BMI) 29.0-29.9, adult: Secondary | ICD-10-CM | POA: Diagnosis not present

## 2023-03-21 ENCOUNTER — Telehealth: Payer: Self-pay

## 2023-03-21 NOTE — Progress Notes (Unsigned)
   Care Guide Note  03/21/2023 Name: Jamie Harding MRN: [id number] DOB: 1956/03/31  Referred by: Babs Sciara, MD Reason for referral : Care Coordination (Outreach to reschedule with pharm d )   Jamie Harding is a 67 y.o. year old female who is a primary care patient of Luking, Jonna Coup, MD. Jamie Harding was referred to the pharmacist for assistance related to DM.    An unsuccessful telephone outreach was attempted today to contact the patient who was referred to the pharmacy team for assistance with medication management. Additional attempts will be made to contact the patient.   Jamie Harding, Jamie Harding Care Guide [facility]  [place], [place] [address] Direct Dial: [phone] [email or web address]

## 2023-03-29 DIAGNOSIS — J040 Acute laryngitis: Secondary | ICD-10-CM | POA: Diagnosis not present

## 2023-03-29 NOTE — Progress Notes (Unsigned)
   Care Guide Note  03/29/2023 Name: SYMBA RAMSDELL MRN: [id number] DOB: 1956-03-16  Referred by: Babs Sciara, MD Reason for referral : Care Coordination (Outreach to reschedule with pharm d )   PAULI KLUNDT is a 67 y.o. year old female who is a primary care patient of Luking, Jonna Coup, MD. Eduardo Osier [place] was referred to the pharmacist for assistance related to DM.    A second unsuccessful telephone outreach was attempted today to contact the patient who was referred to the pharmacy team for assistance with medication assistance. Additional attempts will be made to contact the patient.  Penne Lash, RMA Care Guide [facility]  [place], [place] [address] Direct Dial: [phone] [email or web address]

## 2023-04-03 NOTE — Progress Notes (Signed)
   Care Guide Note  04/03/2023 Name: Jamie Harding MRN: [id number] DOB: 03/06/1956  Referred by: Babs Sciara, MD Reason for referral : Care Coordination (Outreach to reschedule with pharm d )   Jamie Harding is a 67 y.o. year old female who is a primary care patient of Luking, Jonna Coup, MD. Eduardo Osier Topp was referred to the pharmacist for assistance related to DM.    A third unsuccessful telephone outreach was attempted today to contact the patient who was referred to the pharmacy team for assistance with medication management. The Population Health team is pleased to engage with this patient at any time in the future upon receipt of referral and should he/she be interested in assistance from the [facility] team.   Penne Lash, RMA Care Guide [facility]  [place], [place] [address] Direct Dial: [phone] [email or web address]

## 2023-04-22 ENCOUNTER — Other Ambulatory Visit: Payer: Self-pay | Admitting: Family Medicine

## 2023-05-09 ENCOUNTER — Encounter: Payer: Self-pay | Admitting: Family Medicine

## 2023-05-09 ENCOUNTER — Ambulatory Visit (INDEPENDENT_AMBULATORY_CARE_PROVIDER_SITE_OTHER): Payer: PPO | Admitting: Family Medicine

## 2023-05-09 VITALS — BP 130/78 | HR 81 | Ht 62.0 in | Wt 187.2 lb

## 2023-05-09 DIAGNOSIS — J019 Acute sinusitis, unspecified: Secondary | ICD-10-CM

## 2023-05-09 DIAGNOSIS — G894 Chronic pain syndrome: Secondary | ICD-10-CM

## 2023-05-09 DIAGNOSIS — M5440 Lumbago with sciatica, unspecified side: Secondary | ICD-10-CM

## 2023-05-09 DIAGNOSIS — I10 Essential (primary) hypertension: Secondary | ICD-10-CM

## 2023-05-09 DIAGNOSIS — E1169 Type 2 diabetes mellitus with other specified complication: Secondary | ICD-10-CM

## 2023-05-09 DIAGNOSIS — K3184 Gastroparesis: Secondary | ICD-10-CM

## 2023-05-09 DIAGNOSIS — E1159 Type 2 diabetes mellitus with other circulatory complications: Secondary | ICD-10-CM

## 2023-05-09 DIAGNOSIS — E785 Hyperlipidemia, unspecified: Secondary | ICD-10-CM | POA: Diagnosis not present

## 2023-05-09 DIAGNOSIS — Z79891 Long term (current) use of opiate analgesic: Secondary | ICD-10-CM

## 2023-05-09 DIAGNOSIS — Z794 Long term (current) use of insulin: Secondary | ICD-10-CM | POA: Diagnosis not present

## 2023-05-09 MED ORDER — HYDROCODONE-ACETAMINOPHEN 10-325 MG PO TABS: ORAL_TABLET | ORAL | 0 refills | Status: DC

## 2023-05-09 MED ORDER — LISINOPRIL 2.5 MG PO TABS: 2.5000 mg | ORAL_TABLET | Freq: Every day | ORAL | 1 refills | Status: DC

## 2023-05-09 MED ORDER — DOXYCYCLINE HYCLATE 100 MG PO TABS: 100.0000 mg | ORAL_TABLET | Freq: Two times a day (BID) | ORAL | 0 refills | Status: DC

## 2023-05-09 MED ORDER — FLUCONAZOLE 150 MG PO TABS: 150.0000 mg | ORAL_TABLET | Freq: Once | ORAL | 3 refills | Status: AC

## 2023-05-09 NOTE — Progress Notes (Signed)
Subjective:    Patient ID: Jamie Harding, female    DOB: 1956-11-08, 67 y.o.   MRN: [id number]  HPI  Patient states she needs refill for Diflucan for yeast infection.   Patient states she went 4 weeks ago for sore throat and cough to urgent. She states she is feeling better but can't seem to get rid of the cough.  This patient was seen today for chronic pain  The medication list was reviewed and updated.    Location of Pain for which the patient has been treated with regarding narcotics: Lumbar pain discomfort radiates into both legs worse on the left side  Onset of this pain: Present for years   -Compliance with medication: Good compliance  - Number patient states they take daily: 3-4 daily  -Reason for ongoing use of opioids lumbar pain  What other measures have been tried outside of opioids Tylenol, NSAIDs, injections, x-rays, consult with back specialist  In the ongoing specialists regarding this condition none currently  -when was the last dose patient took? 0700 this morning  The patient was advised the importance of maintaining medication and not using illegal substances with these.  Here for refills and follow up  The patient was educated that we can provide 3 monthly scripts for their medication, it is their responsibility to follow the instructions.  Side effects or complications from medications: Denies side effects  Patient is aware that pain medications are meant to minimize the severity of the pain to allow their pain levels to improve to allow for better function. They are aware of that pain medications cannot totally remove their pain.  Due for UDT ( at least once per year) (pain management contract is also completed at the time of the UDT): 11/08/2022  Scale of 1 to 10 ( 1 is least 10 is most) Your pain level without the medicine: 8 Your pain level with medication 6  Scale 1 to 10 ( 1-helps very little, 10 helps very well) How well does your pain  medication reduce your pain so you can function better through out the day? 7  Quality of the pain: Aching  Persistence of the pain: Present all the time  Modifying factors: Worse with activity  Outpatient Encounter Medications as of 05/09/2023  Medication Sig   albuterol (VENTOLIN HFA) 108 (90 Base) MCG/ACT inhaler INHALE 2 PUFFS INTO THE LUNGS EVERY 4 (FOUR) HOURS AS NEEDED FOR WHEEZING.   amLODipine (NORVASC) 2.5 MG tablet Take 1 tablet (2.5 mg total) by mouth daily.   Blood Glucose Monitoring Suppl (ONETOUCH VERIO FLEX SYSTEM) w/Device KIT USE AS DIRECTED TEST BLOOD SUGAR 4 TIMES DAILY.   busPIRone (BUSPAR) 10 MG tablet Take 1 tablet BID PRN   cetirizine (ZYRTEC) 10 MG tablet TAKE ONE TABLET BY MOUTH ONCE DAILY.   clopidogrel (PLAVIX) 75 MG tablet Take 1 tablet (75 mg total) by mouth daily with breakfast.   dicyclomine (BENTYL) 10 MG capsule Take 1 capsule (10 mg total) by mouth 3 (three) times daily as needed (for abdominal cramping or diarrhea).   doxycycline (VIBRA-TABS) 100 MG tablet Take 1 tablet (100 mg total) by mouth 2 (two) times daily.   fluconazole (DIFLUCAN) 150 MG tablet Take 1 tablet (150 mg total) by mouth once for 1 dose.   fluticasone (FLONASE) 50 MCG/ACT nasal spray Place 2 sprays into both nostrils daily.   glucose blood (ONETOUCH VERIO) test strip USE AS DIRECTED TO TEST BLOOD GLUCOSE 4 TIMES DAILY.   insulin detemir (  LEVEMIR) 100 UNIT/ML injection Inject 72 units in the skin in the morning and 72 units into skin qhs- may titrate to 85 units BID   linaclotide (LINZESS) 290 MCG CAPS capsule Take 1 capsule (290 mcg total) by mouth daily before breakfast.   metFORMIN (GLUCOPHAGE) 1000 MG tablet TAKE ONE TABLET BY MOUTH TWO TIMES A DAY.   Multiple Vitamin (MULTIVITAMIN WITH MINERALS) TABS Take 1 tablet by mouth daily.   naloxegol oxalate (MOVANTIK) 25 MG TABS tablet Take 1 tablet (25 mg total) by mouth daily.   NOVOLOG 100 UNIT/ML injection Inject 10 units into skin with  breakfast and 10 units into skin at supper   ondansetron (ZOFRAN) 8 MG tablet TAKE ONE TABLET BY MOUTH EVERY 8 HOURS AS NEEDED FOR NAUSEA/VOMITING.   pantoprazole (PROTONIX) 40 MG tablet TAKE (1) TABLET BY MOUTH TWICE A DAY BEFORE A MEAL.   promethazine (PHENERGAN) 25 MG tablet TAKE ONE TABLET BY MOUTH EVERY EIGHT HOURS AS NEEDED FOR NAUSEA   rosuvastatin (CRESTOR) 20 MG tablet Take 1 tablet (20 mg total) by mouth daily.   venlafaxine (EFFEXOR) 75 MG tablet TAKE (1) TABLET BY MOUTH 3 TIMES DAILY   [DISCONTINUED] HYDROcodone-acetaminophen (NORCO) 10-325 MG tablet One taken 4 times a day as needed for pain caution drowsiness   [DISCONTINUED] HYDROcodone-acetaminophen (NORCO) 10-325 MG tablet One qid prn pain   [DISCONTINUED] HYDROcodone-acetaminophen (NORCO) 10-325 MG tablet One qid prn pain   [DISCONTINUED] lisinopril (ZESTRIL) 5 MG tablet Take 1 tablet (5 mg total) by mouth daily.   HYDROcodone-acetaminophen (NORCO) 10-325 MG tablet One taken 4 times a day as needed for pain caution drowsiness   HYDROcodone-acetaminophen (NORCO) 10-325 MG tablet One qid prn pain   HYDROcodone-acetaminophen (NORCO) 10-325 MG tablet One qid prn pain   lisinopril (ZESTRIL) 2.5 MG tablet Take 1 tablet (2.5 mg total) by mouth daily.   No facility-administered encounter medications on file as of 05/09/2023.    Past Medical History:  Diagnosis Date   Anxiety    Asthma    Depression    Essential hypertension    Fatty liver    Gastritis    Gastroparesis    GERD (gastroesophageal reflux disease)    Hiatal hernia    History of cardiac catheterization    Minimal coronary atherosclerosis February 2016   History of kidney stones    History of stroke 2008   HLD (hyperlipidemia)    Neuropathy    Obesity    Pancreatitis    Rheumatoid arthritis(714.0)    Stroke (HCC) 2008   Stroke [facility]) 01/2020   Tibialis tendinitis    Type 2 diabetes mellitus (HCC)     Back pain of lumbar region with sciatica  Essential  hypertension - Plan: Basic Metabolic Panel, CBC with Differential  Chronic pain syndrome  Gastroparesis  Acute rhinosinusitis  Type 2 diabetes mellitus with vascular disease (HCC) - Plan: Hemoglobin A1c  Hyperlipidemia associated with type 2 diabetes mellitus (HCC) - Plan: Lipid panel  The patient was seen today as part of a comprehensive diabetic check up. Patient has diabetes Patient relates good compliance with taking the medication. We discussed their diet and exercise activities  We also discussed the importance of notifying us if any excessively high glucoses or low sugars.    Patient here for follow-up regarding cholesterol.    Patient relates taking medication on a regular basis Denies problems with medication Importance of dietary measures discussed Regular lab work regarding lipid and liver was checked and if needing additional  labs was appropriately ordered  Patient for blood pressure check up.  The patient does have hypertension.   Patient relates dietary measures try to minimize salt The importance of healthy diet and activity were discussed Patient relates compliance           Review of Systems     Objective:   Physical Exam  General-in no acute distress Eyes-no discharge Lungs-respiratory rate normal, CTA CV-no murmurs,RRR Extremities skin warm dry no edema Neuro grossly normal Behavior normal, alert       Assessment & Plan:   1. Back pain of lumbar region with sciatica Patient has ongoing back pain Surgeons have told her that further intervention not indicated She has done physical therapy injections she cannot take NSAIDs   2. Essential hypertension Reasonable control continue healthy diet - Basic Metabolic Panel - CBC with Differential  3. Chronic pain syndrome The patient was seen in followup for chronic pain. A review over at their current pain status was discussed. Drug registry was checked. Prescriptions were given.   Regular follow-up recommended. Discussion was held regarding the importance of compliance with medication as well as pain medication contract.  Patient was informed that medication may cause drowsiness and should not be combined  with other medications/alcohol or street drugs. If the patient feels medication is causing altered alertness then do not drive or operate dangerous equipment.  Should be noted that the patient appears to be meeting appropriate use of opioids and response.  Evidenced by improved function and decent pain control without significant side effects and no evidence of overt aberrancy issues.  Upon discussion with the patient today they understand that opioid therapy is optional and they feel that the pain has been refractory to reasonable conservative measures and is significant and affecting quality of life enough to warrant ongoing therapy and wishes to continue opioids.  Refills were provided.  Patient has significant back pain discomfort that she has tried injections physical therapy and NSAIDs for.  Cannot tolerate NSAIDs because of gastroparesis.  Pain medicine does take the edge off her pain allows her to function better.  She is under a pain management contract she does do yearly urine drug screens and the PDMP is checked on a regular basis  4. Gastroparesis Frequent meals small amounts at a time  5. Acute rhinosinusitis Antibiotics indicated if her hoarseness and congestion does not clear up over the next 2 weeks notify us and may have to refer to ENT  6. Type 2 diabetes mellitus with vascular disease (HCC) Lab work before the follow-up visit healthy diet continue current medications Patient on long-acting insulin 50 units twice a day she is also on a small amount of short acting insulin with her meals She is working with the clinical pharmacist about getting continuous glucose monitor as well as closer management of her diabetes - Hemoglobin A1c  7. Hyperlipidemia  associated with type 2 diabetes mellitus (HCC) Statins healthy diet - Lipid panel

## 2023-05-15 ENCOUNTER — Ambulatory Visit: Payer: PPO | Admitting: Family Medicine

## 2023-05-18 ENCOUNTER — Other Ambulatory Visit: Payer: Self-pay | Admitting: Family Medicine

## 2023-05-27 ENCOUNTER — Encounter: Payer: Self-pay | Admitting: Family Medicine

## 2023-06-03 ENCOUNTER — Other Ambulatory Visit: Payer: Self-pay | Admitting: Gastroenterology

## 2023-06-03 ENCOUNTER — Other Ambulatory Visit: Payer: Self-pay | Admitting: Family Medicine

## 2023-06-03 DIAGNOSIS — K3184 Gastroparesis: Secondary | ICD-10-CM

## 2023-06-20 ENCOUNTER — Encounter: Payer: PPO | Admitting: Orthopedic Surgery

## 2023-07-02 ENCOUNTER — Ambulatory Visit (INDEPENDENT_AMBULATORY_CARE_PROVIDER_SITE_OTHER): Payer: PPO | Admitting: Family Medicine

## 2023-07-02 ENCOUNTER — Encounter: Payer: Self-pay | Admitting: Family Medicine

## 2023-07-02 VITALS — BP 125/81 | HR 101 | Temp 97.8°F | Ht 62.0 in | Wt 178.0 lb

## 2023-07-02 DIAGNOSIS — L309 Dermatitis, unspecified: Secondary | ICD-10-CM

## 2023-07-02 DIAGNOSIS — R42 Dizziness and giddiness: Secondary | ICD-10-CM

## 2023-07-02 DIAGNOSIS — K3184 Gastroparesis: Secondary | ICD-10-CM | POA: Diagnosis not present

## 2023-07-02 MED ORDER — TRIAMCINOLONE ACETONIDE 0.1 % EX CREA: 1.0000 | TOPICAL_CREAM | Freq: Two times a day (BID) | CUTANEOUS | 4 refills | Status: DC

## 2023-07-02 MED ORDER — ROSUVASTATIN CALCIUM 20 MG PO TABS: 20.0000 mg | ORAL_TABLET | Freq: Every day | ORAL | 1 refills | Status: DC

## 2023-07-02 MED ORDER — KETOCONAZOLE 2 % EX CREA: 1.0000 | TOPICAL_CREAM | Freq: Two times a day (BID) | CUTANEOUS | 4 refills | Status: DC

## 2023-07-02 NOTE — Progress Notes (Signed)
   Subjective:    Patient ID: Jamie Harding, female    DOB: [birth year], 67 y.o.   MRN: [id number]  HPI Patient concerns with itchy, rash areas located in different areas on her arm and back that have been coming and going for the past 2 months   Patient states hx of gastroporesis and worsened vomiting and abd discomfort constipation/ diarrhea   Erythematous areas on the left arm and that fold of the elbow as well as having erythematous areas on the back and some on the abdomen.  At times also get yeast related rash but not currently  Does have central obesity related into her type 2 diabetes on insulin  She does have intermittent nausea and vomiting depending on when she eats and how much she eats.  She states Phenergan and Zofran seems to do the best for no need for current Reglan according to patient  Review of Systems     Objective:   Physical Exam  General-in no acute distress Eyes-no discharge Lungs-respiratory rate normal, CTA CV-no murmurs,RRR Extremities skin warm dry no edema Neuro grossly normal Behavior normal, alert Eczema noted in the fold of the arm on the left side and also on her back no yeast rash noted currently      Assessment & Plan:  1. Gastroparesis Zofran Phenergan as needed Hold off on Reglan currently Small meals frequently Avoid fried fatty foods  2. Dizzy Patient has some orthostasis probably related to some autonomic dysfunction related to her diabetes but at this point in time stop amlodipine.  May continue lisinopril.  3. Eczema, unspecified type Triamcinolone twice daily as needed   Ketoconazole only if necessary for yeast related rash  She will do lab work before her next visit which is a follow-up on diabetes as well as pain management

## 2023-07-18 ENCOUNTER — Encounter: Payer: Self-pay | Admitting: Orthopedic Surgery

## 2023-07-18 ENCOUNTER — Ambulatory Visit: Payer: PPO | Admitting: Orthopedic Surgery

## 2023-07-18 VITALS — BP 144/86 | HR 87 | Ht 62.0 in | Wt 176.0 lb

## 2023-07-18 DIAGNOSIS — Z01818 Encounter for other preprocedural examination: Secondary | ICD-10-CM

## 2023-07-18 DIAGNOSIS — M65311 Trigger thumb, right thumb: Secondary | ICD-10-CM | POA: Diagnosis not present

## 2023-07-18 DIAGNOSIS — M65312 Trigger thumb, left thumb: Secondary | ICD-10-CM | POA: Diagnosis not present

## 2023-07-18 DIAGNOSIS — M65321 Trigger finger, right index finger: Secondary | ICD-10-CM | POA: Diagnosis not present

## 2023-07-18 MED ORDER — METHYLPREDNISOLONE ACETATE 40 MG/ML IJ SUSP
40.0000 mg | Freq: Once | INTRAMUSCULAR | Status: AC
Start: 2023-07-18 — End: 2023-07-18
  Administered 2023-07-18: 40 mg via INTRA_ARTICULAR

## 2023-07-18 NOTE — Progress Notes (Signed)
Chief Complaint  Patient presents with   Hand Pain    Left hand pain in thumb with clicking right hand is thumb and index finger pain would like possible injection in bil hand     Endora had her right index finger injected her left thumb injected for triggering and she will schedule surgery on the right thumb also for triggering  Left Trigger thumb injection Medication  1 mL of 40 mg Depo-Medrol  2 mL of 1% lidocaine plain  Ethyl chloride for anesthesia  Verbal consent was obtained timeout was taken to confirm the injection site as left thumb  Alcohol was used to prepare the skin along with ethyl chloride and then the injection was made at the A1 pulley there were no complications  Encounter Diagnoses  Name Primary?   Pre-op exam Yes   Trigger finger of right thumb    Trigger finger of left thumb    Trigger finger, right index finger    Problem list, medical hx, medications and allergies reviewed    Stop Plavix 7 days before surgery

## 2023-07-18 NOTE — Patient Instructions (Signed)
Your surgery will be at [facility] by Dr Romeo Apple  The hospital will contact you with a preoperative appointment to discuss Anesthesia.  Please arrive on time or 15 minutes early for the preoperative appointment, they have a very tight schedule if you are late or do not come in your surgery will be cancelled.  The phone number is [phone]. Please bring your medications with you for the appointment. They will tell you the arrival time and medication instructions when you have your preoperative evaluation.  STOP Plavix on Sept 11th /Surgery will Be Sept 18th  Do not wear nail polish the day of your surgery and if you take Phentermine you need to stop this medication ONE WEEK prior to your surgery. If you take Docia Barrier, Jardiance, or Steglatro) - Hold 72 hours before the procedure.  If you take Ozempic,  Mounjaro, Bydureon or Trulicity do not take for 8 days before your surgery. If you take Victoza, Rybelsis, Saxenda or Adlyxi stop 24 hours before the procedure.  Please arrive at the hospital 2 hours before procedure if scheduled at 9:30 or later in the day or at the time the nurse tells you at your preoperative visit.   If you have my chart do not use the time given in my chart use the time given to you by the nurse during your preoperative visit.   Your surgery  time may change. Please be available for phone calls the day of your surgery and the day before. The Short Stay department may need to discuss changes about your surgery time. Not reaching the you could lead to procedure delays and possible cancellation.  You must have a ride home and someone to stay with you for 24 to 48 hours. The person taking you home will receive and sign for the your discharge instructions.  Please be prepared to give your support person's name and telephone number to Central Registration. Dr Romeo Apple will need that name and phone number post procedure.

## 2023-07-25 DIAGNOSIS — E669 Obesity, unspecified: Secondary | ICD-10-CM | POA: Diagnosis not present

## 2023-07-25 DIAGNOSIS — S43401A Unspecified sprain of right shoulder joint, initial encounter: Secondary | ICD-10-CM | POA: Diagnosis not present

## 2023-07-25 DIAGNOSIS — Z6833 Body mass index (BMI) 33.0-33.9, adult: Secondary | ICD-10-CM | POA: Diagnosis not present

## 2023-07-25 DIAGNOSIS — R030 Elevated blood-pressure reading, without diagnosis of hypertension: Secondary | ICD-10-CM | POA: Diagnosis not present

## 2023-07-30 DIAGNOSIS — I10 Essential (primary) hypertension: Secondary | ICD-10-CM | POA: Diagnosis not present

## 2023-07-30 DIAGNOSIS — E1159 Type 2 diabetes mellitus with other circulatory complications: Secondary | ICD-10-CM | POA: Diagnosis not present

## 2023-07-30 DIAGNOSIS — E785 Hyperlipidemia, unspecified: Secondary | ICD-10-CM | POA: Diagnosis not present

## 2023-07-30 DIAGNOSIS — E1169 Type 2 diabetes mellitus with other specified complication: Secondary | ICD-10-CM | POA: Diagnosis not present

## 2023-08-02 ENCOUNTER — Encounter (HOSPITAL_COMMUNITY)
Admission: RE | Admit: 2023-08-02 | Discharge: 2023-08-02 | Disposition: A | Discharge status: Home / Self Care | Payer: PPO | Source: Ambulatory Visit | Attending: Orthopedic Surgery | Admitting: Orthopedic Surgery

## 2023-08-05 ENCOUNTER — Telehealth: Payer: Self-pay | Admitting: Orthopedic Surgery

## 2023-08-05 NOTE — Telephone Encounter (Signed)
Tried to return the patient's call, unable to lvm.  She is wanting to see Dr. Rexene Edison for a shoulder injury.

## 2023-08-06 ENCOUNTER — Telehealth: Payer: Self-pay | Admitting: Orthopedic Surgery

## 2023-08-06 ENCOUNTER — Encounter (HOSPITAL_COMMUNITY): Payer: Self-pay

## 2023-08-06 NOTE — Telephone Encounter (Signed)
Dr. Mort Sawyers pt - [facility] [phone] called, she stated that they have been trying to get the pt for three days and they can't reach her.  She is wanting to know what Dr. Romeo Apple wants to do regarding her surgery tomorrow.

## 2023-08-06 NOTE — Telephone Encounter (Addendum)
Patient has not called back to schedule her preop appointment with [facility], surgery for tomorrow cancelled  I sent her a mychart message to advise and asked her to call me back to reschedule.   They just called, and they did get her and surgery will stay as scheduled  Disregard message.

## 2023-08-06 NOTE — Telephone Encounter (Signed)
No, she didn't give her last name, I called and got her and advised to cancel the surgery.

## 2023-08-06 NOTE — H&P (Signed)
Admission history and physical for surgery as an outpatient  Procedure right thumb trigger release  Diagnosis trigger thumb right hand  History 67 year old female presented with chronic triggering of multiple fingers of the right thumb was bothering her the most and despite nonoperative care she continued to have pain over the A1 pulley with triggering phenomenon and wishes to have this released  Review of systems currently negative  Past Medical History:  Diagnosis Date   Anxiety    Asthma    Depression    Essential hypertension    Fatty liver    Gastritis    Gastroparesis    GERD (gastroesophageal reflux disease)    Hiatal hernia    History of cardiac catheterization    Minimal coronary atherosclerosis February 2016   History of kidney stones    History of stroke 2008   HLD (hyperlipidemia)    Neuropathy    Obesity    Pancreatitis    Rheumatoid arthritis(714.0)    Stroke (HCC) 2008   Stroke (HCC) 01/2020   Tibialis tendinitis    Type 2 diabetes mellitus (HCC)    Past Surgical History:  Procedure Laterality Date   ANKLE SURGERY     remove extra bone-left   BIOPSY  04/25/2021   Procedure: BIOPSY;  Surgeon: Lanelle Bal, DO;  Location: [facility];  Service: Endoscopy;;   CARPAL TUNNEL RELEASE     right side   CARPAL TUNNEL RELEASE Left 02/06/2013   Procedure: CARPAL TUNNEL RELEASE ;  Surgeon: Vickki Hearing, MD;  Location: [facility];  Service: Orthopedics;  Laterality: Left;  procedure end 1135   CARPAL TUNNEL RELEASE Right 09/14/2015   Procedure: RIGHT CARPAL TUNNEL RELEASE;  Surgeon: Vickki Hearing, MD;  Location: [facility];  Service: Orthopedics;  Laterality: Right;   CARPAL TUNNEL RELEASE Left 09/28/2015   Procedure: LEFT CARPAL TUNNEL RELEASE;  Surgeon: Vickki Hearing, MD;  Location: [facility];  Service: Orthopedics;  Laterality: Left;  procedure 1   CESAREAN SECTION     CHOLECYSTECTOMY     COLONOSCOPY WITH PROPOFOL  09/16/2012   YQM:VHQI sessile  polyps ranging between 3-35mm in size were found in the sigmoid colon and rectum (hyperplastic)/Mild diverticulosis was noted throughout the entire examined colon/Small internal hemorrhoids. Repeat in 10 years.   COLONOSCOPY WITH PROPOFOL N/A 09/24/2022   Procedure: COLONOSCOPY WITH PROPOFOL;  Surgeon: Lanelle Bal, DO;  Location: [facility];  Service: Endoscopy;  Laterality: N/A;  12:15pm, asa 3   DILATATION & CURETTAGE/HYSTEROSCOPY WITH MYOSURE N/A 08/05/2019   Procedure: DILATATION & CURETTAGE/HYSTEROSCOPY WITH MYOSURE;  Surgeon: Conan Bowens, MD;  Location: [facility];  Service: Gynecology;  Laterality: N/A;  myosure rep will be here.  Confirmed on 07/30/19 CS   DILATION AND CURETTAGE OF UTERUS     multiple   ESOPHAGOGASTRODUODENOSCOPY  07/29/2009   Mild gastritis, benign path   ESOPHAGOGASTRODUODENOSCOPY (EGD) WITH PROPOFOL  09/16/2012   SLF:Non-erosive gastritis (inflammation) was found; multiple bx/The duodenal mucosa showed no abnormalities in the ampulla and bulb and second portion of the duodenum/NAUSEA/VOMITING MOST LIKELY DUE TO GERD/GASTRITIS   ESOPHAGOGASTRODUODENOSCOPY (EGD) WITH PROPOFOL N/A 04/25/2021   Surgeon: Lanelle Bal, DO; Gastritis biopsied, normal examined duodenum biopsied.  Pathology revealed focal chronic gastritis, negative for H. Pylori, benign small bowel mucosa.   FINGER SURGERY Left    left little finger-otif of finger   Gastric Emptying  07/27/2009   The amount of activity in the stomach at 120 minutes  was 13% which is in the normal range   LASER ABLATION OF THE CERVIX     LEFT AND RIGHT HEART CATHETERIZATION WITH CORONARY ANGIOGRAM N/A 01/03/2015   Procedure: LEFT AND RIGHT HEART CATHETERIZATION WITH CORONARY ANGIOGRAM;  Surgeon: Micheline Chapman, MD;  Location: [facility] CATH [facility];  Service: Cardiovascular;  Laterality: N/A;   LITHOTRIPSY     POLYPECTOMY  09/16/2012   Procedure: POLYPECTOMY;  Surgeon: West Bali, MD;  Location: [facility];  Service: Endoscopy;  Laterality: N/A;   POLYPECTOMY  09/24/2022   Procedure: POLYPECTOMY;  Surgeon: Lanelle Bal, DO;  Location: [facility];  Service: Endoscopy;;   SAVORY DILATION  09/16/2012   Procedure: SAVORY DILATION;  Surgeon: West Bali, MD;  Location: [facility];  Service: Endoscopy;  Laterality: N/A;  16 fr dilation   TRIGGER FINGER RELEASE Left 02/06/2013   Procedure: RELEASE TRIGGER FINGER LEFT LONG FINGER/A-1 PULLEY;  Surgeon: Vickki Hearing, MD;  Location: [facility];  Service: Orthopedics;  Laterality: Left;  procedure began 1136   TRIGGER FINGER RELEASE Right 09/14/2015   Procedure: RIGHT LONG FINGER TRIGGER RELEASE;  Surgeon: Vickki Hearing, MD;  Location: [facility];  Service: Orthopedics;  Laterality: Right;   TRIGGER FINGER RELEASE Left 09/28/2015   Procedure: LEFT RING TRIGGER FINGER RELEASE;  Surgeon: Vickki Hearing, MD;  Location: [facility];  Service: Orthopedics;  Laterality: Left;  left ring finger   TUBAL LIGATION     Family History  Problem Relation Age of Onset   Breast cancer Mother    Diabetes Father    Coronary artery disease Other    Arthritis Other    Asthma Other    Diabetes Sister    Colon cancer Neg Hx    Allergies  Allergen Reactions   Byetta 10 Mcg Pen [Exenatide] Diarrhea and Nausea And Vomiting      touch of pancreatis   Naproxen Nausea And Vomiting   Pollen Extract    Augmentin [Amoxicillin-Pot Clavulanate]     diarrhea   Azithromycin Nausea And Vomiting and Rash   Erythromycin Hives        Invokana [Canagliflozin]     Urinary issues,yeast infection   Morphine Hives and Rash   Sulfonamide Derivatives Nausea And Vomiting and Rash   Current Outpatient Medications  Medication Instructions   albuterol (VENTOLIN HFA) 108 (90 Base) MCG/ACT inhaler INHALE 2 PUFFS INTO THE LUNGS EVERY 4 (FOUR) HOURS AS NEEDED FOR WHEEZING.   Blood Glucose Monitoring Suppl (ONETOUCH VERIO FLEX SYSTEM) w/Device KIT USE AS DIRECTED TEST BLOOD SUGAR  4 TIMES DAILY.   busPIRone (BUSPAR) 10 MG tablet Take 1 tablet BID PRN   cetirizine (ZYRTEC) 10 MG tablet TAKE ONE TABLET BY MOUTH ONCE DAILY.   clopidogrel (PLAVIX) 75 mg, Oral, Daily with breakfast   dicyclomine (BENTYL) 10 mg, Oral, 3 times daily PRN   fluticasone (FLONASE) 50 MCG/ACT nasal spray 2 sprays, Each Nare, Daily   glucose blood (ONETOUCH VERIO) test strip USE AS DIRECTED TO TEST BLOOD GLUCOSE 4 TIMES DAILY.   HYDROcodone-acetaminophen (NORCO) 10-325 MG tablet One taken 4 times a day as needed for pain caution drowsiness   HYDROcodone-acetaminophen (NORCO) 10-325 MG tablet One qid prn pain   HYDROcodone-acetaminophen (NORCO) 10-325 MG tablet One qid prn pain   insulin detemir (LEVEMIR) 100 UNIT/ML injection Inject 72 units in the skin in the morning and 72 units into skin qhs- may titrate to 85 units BID   ketoconazole (NIZORAL) 2 % cream  1 Application, Topical, 2 times daily   linaclotide (LINZESS) 290 mcg, Oral, Daily before breakfast   lisinopril (ZESTRIL) 2.5 mg, Oral, Daily   metFORMIN (GLUCOPHAGE) 1000 MG tablet TAKE ONE TABLET BY MOUTH TWO TIMES A DAY.   Multiple Vitamin (MULTIVITAMIN WITH MINERALS) TABS 1 tablet, Daily   naloxegol oxalate (MOVANTIK) 25 mg, Oral, Daily   NOVOLOG 100 UNIT/ML injection Inject 10 units into skin with breakfast and 10 units into skin at supper   ondansetron (ZOFRAN) 8 MG tablet TAKE ONE TABLET BY MOUTH EVERY 8 HOURS AS NEEDED FOR NAUSEA/VOMITING.   pantoprazole (PROTONIX) 40 MG tablet TAKE (1) TABLET BY MOUTH TWICE A DAY BEFORE A MEAL.   promethazine (PHENERGAN) 25 MG tablet TAKE 1 TABLET BY MOUTH EVERY 8 HOURS AS NEEDED FOR NAUSEA.   rosuvastatin (CRESTOR) 20 mg, Oral, Daily   triamcinolone cream (KENALOG) 0.1 % 1 Application, Topical, 2 times daily   venlafaxine (EFFEXOR) 75 MG tablet TAKE (1) TABLET BY MOUTH 3 TIMES DAILY    General appearance is normal  Mental status awake alert and oriented x 3  Psychiatric mood and affect  normal  Lymphatic system upper extremities negative  Vascular system normal pulse perfusion capillary refill color and temperature  Musculoskeletal  Focused exam right thumb  Skin is normal and intact no erythema.  Tenderness over the A1 pulley.  Catching locking demonstrated in the office.  Flexor tendon intact.  Extensor tendon intact.  No instability noted in the thumb area.  Diagnosis trigger phenomenon right thumb  Plan procedure open release of right trigger thumb with release of A1 pulley

## 2023-08-06 NOTE — Telephone Encounter (Signed)
If she hasn't had preop cancel surgery I called to advise but no answer.  Sent patient a my chart message she needs to call me for new date and she needs to go for preop   I can't get answer when I call [facility] she didn't give her last name did she? I can message her if I know who she is.

## 2023-08-07 ENCOUNTER — Ambulatory Visit (HOSPITAL_BASED_OUTPATIENT_CLINIC_OR_DEPARTMENT_OTHER): Payer: PPO | Admitting: Anesthesiology

## 2023-08-07 ENCOUNTER — Other Ambulatory Visit: Payer: Self-pay

## 2023-08-07 ENCOUNTER — Ambulatory Visit (HOSPITAL_COMMUNITY)
Admission: RE | Admit: 2023-08-07 | Discharge: 2023-08-07 | Disposition: A | Discharge status: Home / Self Care | Payer: PPO | Source: Ambulatory Visit | Attending: Orthopedic Surgery | Admitting: Orthopedic Surgery

## 2023-08-07 ENCOUNTER — Ambulatory Visit (HOSPITAL_COMMUNITY): Payer: PPO | Admitting: Anesthesiology

## 2023-08-07 ENCOUNTER — Encounter (HOSPITAL_COMMUNITY)
Admission: RE | Disposition: A | Discharge status: Home / Self Care | Payer: Self-pay | Source: Ambulatory Visit | Attending: Orthopedic Surgery

## 2023-08-07 ENCOUNTER — Encounter (HOSPITAL_COMMUNITY): Payer: Self-pay | Admitting: Orthopedic Surgery

## 2023-08-07 ENCOUNTER — Other Ambulatory Visit (HOSPITAL_COMMUNITY): Payer: Self-pay | Admitting: Family Medicine

## 2023-08-07 DIAGNOSIS — E119 Type 2 diabetes mellitus without complications: Secondary | ICD-10-CM | POA: Diagnosis not present

## 2023-08-07 DIAGNOSIS — I10 Essential (primary) hypertension: Secondary | ICD-10-CM | POA: Insufficient documentation

## 2023-08-07 DIAGNOSIS — F32A Depression, unspecified: Secondary | ICD-10-CM | POA: Insufficient documentation

## 2023-08-07 DIAGNOSIS — E1143 Type 2 diabetes mellitus with diabetic autonomic (poly)neuropathy: Secondary | ICD-10-CM | POA: Insufficient documentation

## 2023-08-07 DIAGNOSIS — F419 Anxiety disorder, unspecified: Secondary | ICD-10-CM | POA: Diagnosis not present

## 2023-08-07 DIAGNOSIS — Z79899 Other long term (current) drug therapy: Secondary | ICD-10-CM | POA: Insufficient documentation

## 2023-08-07 DIAGNOSIS — Z87891 Personal history of nicotine dependence: Secondary | ICD-10-CM | POA: Insufficient documentation

## 2023-08-07 DIAGNOSIS — J45909 Unspecified asthma, uncomplicated: Secondary | ICD-10-CM | POA: Diagnosis not present

## 2023-08-07 DIAGNOSIS — Z7984 Long term (current) use of oral hypoglycemic drugs: Secondary | ICD-10-CM | POA: Insufficient documentation

## 2023-08-07 DIAGNOSIS — M65311 Trigger thumb, right thumb: Secondary | ICD-10-CM

## 2023-08-07 DIAGNOSIS — K219 Gastro-esophageal reflux disease without esophagitis: Secondary | ICD-10-CM | POA: Insufficient documentation

## 2023-08-07 DIAGNOSIS — K449 Diaphragmatic hernia without obstruction or gangrene: Secondary | ICD-10-CM | POA: Diagnosis not present

## 2023-08-07 DIAGNOSIS — Z794 Long term (current) use of insulin: Secondary | ICD-10-CM | POA: Diagnosis not present

## 2023-08-07 DIAGNOSIS — Z7902 Long term (current) use of antithrombotics/antiplatelets: Secondary | ICD-10-CM | POA: Insufficient documentation

## 2023-08-07 DIAGNOSIS — Z1231 Encounter for screening mammogram for malignant neoplasm of breast: Secondary | ICD-10-CM

## 2023-08-07 DIAGNOSIS — Z01818 Encounter for other preprocedural examination: Secondary | ICD-10-CM

## 2023-08-07 DIAGNOSIS — E1140 Type 2 diabetes mellitus with diabetic neuropathy, unspecified: Secondary | ICD-10-CM | POA: Insufficient documentation

## 2023-08-07 DIAGNOSIS — E785 Hyperlipidemia, unspecified: Secondary | ICD-10-CM

## 2023-08-07 HISTORY — PX: TRIGGER FINGER RELEASE: SHX641

## 2023-08-07 LAB — GLUCOSE, CAPILLARY: Glucose-Capillary: 142 mg/dL — ABNORMAL HIGH (ref 70–99)

## 2023-08-07 SURGERY — RELEASE, A1 PULLEY, FOR TRIGGER FINGER
Anesthesia: General | Site: Thumb | Laterality: Right

## 2023-08-07 MED ORDER — PROPOFOL 10 MG/ML IV BOLUS
INTRAVENOUS | Status: DC | PRN
  Administered 2023-08-07: 150 mg via INTRAVENOUS

## 2023-08-07 MED ORDER — FENTANYL CITRATE (PF) 100 MCG/2ML IJ SOLN
INTRAMUSCULAR | Status: AC
  Filled 2023-08-07: qty 2

## 2023-08-07 MED ORDER — ONDANSETRON HCL 4 MG/2ML IJ SOLN
4.0000 mg | Freq: Once | INTRAMUSCULAR | Status: AC | PRN
  Administered 2023-08-07: 4 mg via INTRAVENOUS
  Filled 2023-08-07: qty 2

## 2023-08-07 MED ORDER — ONDANSETRON HCL 4 MG/2ML IJ SOLN
INTRAMUSCULAR | Status: DC | PRN
  Administered 2023-08-07: 4 mg via INTRAVENOUS

## 2023-08-07 MED ORDER — FENTANYL CITRATE PF 50 MCG/ML IJ SOSY: 25.0000 ug | PREFILLED_SYRINGE | INTRAMUSCULAR | Status: DC | PRN

## 2023-08-07 MED ORDER — SODIUM CHLORIDE 0.9 % IR SOLN
Status: DC | PRN
  Administered 2023-08-07: 1000 mL

## 2023-08-07 MED ORDER — LACTATED RINGERS IV SOLN: INTRAVENOUS | Status: DC

## 2023-08-07 MED ORDER — FENTANYL CITRATE (PF) 100 MCG/2ML IJ SOLN
INTRAMUSCULAR | Status: DC | PRN
  Administered 2023-08-07 (×2): 25 ug via INTRAVENOUS
  Administered 2023-08-07: 50 ug via INTRAVENOUS

## 2023-08-07 MED ORDER — CEFAZOLIN SODIUM-DEXTROSE 2-4 GM/100ML-% IV SOLN
2.0000 g | INTRAVENOUS | Status: AC
  Administered 2023-08-07: 2 g via INTRAVENOUS

## 2023-08-07 MED ORDER — BUPIVACAINE HCL (PF) 0.5 % IJ SOLN
INTRAMUSCULAR | Status: AC
  Filled 2023-08-07: qty 30

## 2023-08-07 MED ORDER — LIDOCAINE HCL (CARDIAC) PF 100 MG/5ML IV SOSY
PREFILLED_SYRINGE | INTRAVENOUS | Status: DC | PRN
  Administered 2023-08-07: 60 mg via INTRATRACHEAL

## 2023-08-07 MED ORDER — ORAL CARE MOUTH RINSE: 15.0000 mL | Freq: Once | OROMUCOSAL | Status: AC

## 2023-08-07 MED ORDER — OXYCODONE HCL 5 MG/5ML PO SOLN: 5.0000 mg | Freq: Once | ORAL | Status: DC | PRN

## 2023-08-07 MED ORDER — CHLORHEXIDINE GLUCONATE 0.12 % MT SOLN: 15.0000 mL | Freq: Once | OROMUCOSAL | Status: AC

## 2023-08-07 MED ORDER — ONDANSETRON HCL 4 MG/2ML IJ SOLN
INTRAMUSCULAR | Status: AC
  Filled 2023-08-07: qty 2

## 2023-08-07 MED ORDER — SUCCINYLCHOLINE CHLORIDE 200 MG/10ML IV SOSY
PREFILLED_SYRINGE | INTRAVENOUS | Status: DC | PRN
  Administered 2023-08-07: 120 mg via INTRAVENOUS

## 2023-08-07 MED ORDER — DEXAMETHASONE SODIUM PHOSPHATE 10 MG/ML IJ SOLN
INTRAMUSCULAR | Status: DC | PRN
  Administered 2023-08-07: 5 mg via INTRAVENOUS

## 2023-08-07 MED ORDER — LIDOCAINE HCL (PF) 2 % IJ SOLN
INTRAMUSCULAR | Status: AC
  Filled 2023-08-07: qty 5

## 2023-08-07 MED ORDER — OXYCODONE HCL 5 MG PO TABS: 5.0000 mg | ORAL_TABLET | Freq: Once | ORAL | Status: DC | PRN

## 2023-08-07 MED ORDER — CEFAZOLIN SODIUM-DEXTROSE 2-4 GM/100ML-% IV SOLN
INTRAVENOUS | Status: AC
  Filled 2023-08-07: qty 100

## 2023-08-07 MED ORDER — CHLORHEXIDINE GLUCONATE 0.12 % MT SOLN
OROMUCOSAL | Status: AC
  Administered 2023-08-07: 15 mL via OROMUCOSAL
  Filled 2023-08-07: qty 15

## 2023-08-07 MED ORDER — BUPIVACAINE HCL (PF) 0.5 % IJ SOLN
INTRAMUSCULAR | Status: DC | PRN
  Administered 2023-08-07: 4 mL

## 2023-08-07 SURGICAL SUPPLY — 41 items
APL PRP STRL LF DISP 70% ISPRP (MISCELLANEOUS) ×1
BANDAGE ESMARK 4X12 BL STRL LF (DISPOSABLE) ×1 IMPLANT
BLADE SURG 15 STRL LF DISP TIS (BLADE) ×1 IMPLANT
BLADE SURG 15 STRL SS (BLADE) ×1
BNDG CMPR 12X4 ELC STRL LF (DISPOSABLE) ×1
BNDG CMPR STD VLCR NS LF 5.8X2 (GAUZE/BANDAGES/DRESSINGS) ×1
BNDG ELASTIC 2X5.8 VLCR NS LF (GAUZE/BANDAGES/DRESSINGS) ×1 IMPLANT
BNDG ESMARK 4X12 BLUE STRL LF (DISPOSABLE) ×1
BNDG GAUZE DERMACEA FLUFF 4 (GAUZE/BANDAGES/DRESSINGS) IMPLANT
BNDG GZE DERMACEA 4 6PLY (GAUZE/BANDAGES/DRESSINGS) ×1
CHLORAPREP W/TINT 26 (MISCELLANEOUS) ×1 IMPLANT
CLOTH BEACON ORANGE TIMEOUT ST (SAFETY) ×1 IMPLANT
COVER LIGHT HANDLE STERIS (MISCELLANEOUS) ×2 IMPLANT
CUFF TOURN SGL QUICK 18X4 (TOURNIQUET CUFF) ×1 IMPLANT
DRAPE HALF SHEET 40X57 (DRAPES) ×1 IMPLANT
ELECT NDL TIP 2.8 STRL (NEEDLE) ×1 IMPLANT
ELECT NEEDLE TIP 2.8 STRL (NEEDLE) ×1
ELECT REM PT RETURN 9FT ADLT (ELECTROSURGICAL) ×1
ELECTRODE REM PT RTRN 9FT ADLT (ELECTROSURGICAL) ×1 IMPLANT
GAUZE 4X4 16PLY ~~LOC~~+RFID DBL (SPONGE) ×1 IMPLANT
GAUZE SPONGE 2X2 STRL 8-PLY (GAUZE/BANDAGES/DRESSINGS) IMPLANT
GAUZE SPONGE 4X4 12PLY STRL (GAUZE/BANDAGES/DRESSINGS) ×1 IMPLANT
GAUZE XEROFORM 1X8 LF (GAUZE/BANDAGES/DRESSINGS) ×1 IMPLANT
GLOVE BIO SURGEON STRL SZ7 (GLOVE) IMPLANT
GLOVE BIOGEL PI IND STRL 7.0 (GLOVE) ×2 IMPLANT
GLOVE SS N UNI LF 8.5 STRL (GLOVE) ×1 IMPLANT
GLOVE SURG POLYISO LF SZ8 (GLOVE) ×1 IMPLANT
GOWN STRL REUS W/TWL LRG LVL3 (GOWN DISPOSABLE) ×1 IMPLANT
GOWN STRL REUS W/TWL XL LVL3 (GOWN DISPOSABLE) ×1 IMPLANT
KIT TURNOVER KIT A (KITS) ×1 IMPLANT
MANIFOLD NEPTUNE II (INSTRUMENTS) ×1 IMPLANT
NDL HYPO 21X1.5 SAFETY (NEEDLE) ×1 IMPLANT
NEEDLE HYPO 21X1.5 SAFETY (NEEDLE) ×1
NS IRRIG 1000ML POUR BTL (IV SOLUTION) ×1 IMPLANT
PACK BASIC LIMB (CUSTOM PROCEDURE TRAY) ×1 IMPLANT
PAD ARMBOARD 7.5X6 YLW CONV (MISCELLANEOUS) ×1 IMPLANT
POSITIONER HAND ALUMI XLG (MISCELLANEOUS) ×1 IMPLANT
POSITIONER HEAD 8X9X4 ADT (SOFTGOODS) ×1 IMPLANT
SET BASIN LINEN APH (SET/KITS/TRAYS/PACK) ×1 IMPLANT
SUT ETHILON 3 0 FSL (SUTURE) ×1 IMPLANT
SYR CONTROL 10ML LL (SYRINGE) ×1 IMPLANT

## 2023-08-07 NOTE — Transfer of Care (Signed)
Immediate Anesthesia Transfer of Care Note  Patient: Jamie Harding  Procedure(s) Performed: RELEASE TRIGGER FINGER/A-1 PULLEY Right thumb (Right: Thumb)  Patient Location: PACU  Anesthesia Type:General  Level of Consciousness: awake  Airway & Oxygen Therapy: Patient Spontanous Breathing  Post-op Assessment: Report given to RN and Post -op Vital signs reviewed and stable  Post vital signs: Reviewed and stable  Last Vitals:  Vitals Value Taken Time  BP    Temp    Pulse    Resp    SpO2      Last Pain:  Vitals:   08/07/23 0754  TempSrc: Oral  PainSc: 0-No pain         Complications: No notable events documented.

## 2023-08-07 NOTE — Anesthesia Postprocedure Evaluation (Signed)
Anesthesia Post Note  Patient: Jamie Harding  Procedure(s) Performed: RELEASE TRIGGER FINGER/A-1 PULLEY Right thumb (Right: Thumb)  Patient location during evaluation: Phase II Anesthesia Type: General Level of consciousness: awake Pain management: pain level controlled Vital Signs Assessment: post-procedure vital signs reviewed and stable Respiratory status: spontaneous breathing and respiratory function stable Cardiovascular status: blood pressure returned to baseline and stable Postop Assessment: no headache and no apparent nausea or vomiting Anesthetic complications: no Comments: Late entry   No notable events documented.   Last Vitals:  Vitals:   08/07/23 1000 08/07/23 1029  BP: (!) 142/72 (!) 144/75  Pulse: 100 98  Resp: 14 13  Temp:  36.8 C  SpO2: 95% 96%    Last Pain:  Vitals:   08/07/23 1029  TempSrc: Oral  PainSc: 3                  Windell Norfolk

## 2023-08-07 NOTE — Brief Op Note (Signed)
08/07/2023  9:17 AM  PATIENT:  Eduardo Osier Horger  67 y.o. female  PRE-OPERATIVE DIAGNOSIS:  right thumb trigger finger  POST-OPERATIVE DIAGNOSIS:  right thumb trigger finger  PROCEDURE:  Procedure(s): RELEASE TRIGGER FINGER/A-1 PULLEY Right thumb (Right)  SURGEON:  Surgeons and Role:    * Vickki Hearing, MD - Primary  PHYSICIAN ASSISTANT:   ASSISTANTS: none   ANESTHESIA:   general  EBL:  0 mL   BLOOD ADMINISTERED:none  DRAINS: none   LOCAL MEDICATIONS USED:  MARCAINE     SPECIMEN:  No Specimen  DISPOSITION OF SPECIMEN:  N/A  COUNTS:  YES  TOURNIQUET:   Total Tourniquet Time Documented: Upper Arm (Right) - 14 minutes Total: Upper Arm (Right) - 14 minutes   DICTATION: .Reubin Milan Dictation  PLAN OF CARE: Discharge to home after PACU  PATIENT DISPOSITION:  PACU - hemodynamically stable.   Delay start of Pharmacological VTE agent (>24hrs) due to surgical blood loss or risk of bleeding: not applicable

## 2023-08-07 NOTE — Interval H&P Note (Signed)
History and Physical Interval Note:  08/07/2023 8:19 AM  Jamie Harding  has presented today for surgery, with the diagnosis of right thumb trigger finger.  The various methods of treatment have been discussed with the patient and family. After consideration of risks, benefits and other options for treatment, the patient has consented to  Procedure(s): RELEASE TRIGGER FINGER/A-1 PULLEY Right thumb (Right) as a surgical intervention.  The patient's history has been reviewed, patient examined, no change in status, stable for surgery.  I have reviewed the patient's chart and labs.  Questions were answered to the patient's satisfaction.     Fuller Canada

## 2023-08-07 NOTE — Anesthesia Procedure Notes (Addendum)
Procedure Name: Intubation Date/Time: 08/07/2023 8:49 AM  Performed by: Lorin Glass, CRNAPre-anesthesia Checklist: Patient identified, Emergency Drugs available, Suction available and Patient being monitored Patient Re-evaluated:Patient Re-evaluated prior to induction Oxygen Delivery Method: Circle system utilized Preoxygenation: Pre-oxygenation with 100% oxygen Induction Type: IV induction, Rapid sequence and Cricoid Pressure applied Laryngoscope Size: Mac and 3 Grade View: Grade I Tube size: 7.0 mm Number of attempts: 1 Airway Equipment and Method: Stylet Placement Confirmation: ETT inserted through vocal cords under direct vision, positive ETCO2 and breath sounds checked- equal and bilateral Secured at: 20 cm Tube secured with: Tape Dental Injury: Teeth and Oropharynx as per pre-operative assessment  Comments: Pt intubated by Resident Alicia Amel

## 2023-08-07 NOTE — Anesthesia Preprocedure Evaluation (Signed)
Anesthesia Evaluation  Patient identified by MRN, date of birth, ID band Patient awake    Reviewed: Allergy & Precautions, H&P , NPO status , Patient's Chart, lab work & pertinent test results, reviewed documented beta blocker date and time   Airway Mallampati: II  TM Distance: >3 FB Neck ROM: full    Dental no notable dental hx.    Pulmonary neg pulmonary ROS, asthma , former smoker   Pulmonary exam normal breath sounds clear to auscultation       Cardiovascular Exercise Tolerance: Good hypertension, negative cardio ROS  Rhythm:regular Rate:Normal     Neuro/Psych  PSYCHIATRIC DISORDERS Anxiety Depression     Neuromuscular disease CVA negative neurological ROS  negative psych ROS   GI/Hepatic negative GI ROS, Neg liver ROS, hiatal hernia,GERD  ,,  Endo/Other  negative endocrine ROSdiabetes    Renal/GU Renal diseasenegative Renal ROS  negative genitourinary   Musculoskeletal   Abdominal   Peds  Hematology negative hematology ROS (+)   Anesthesia Other Findings Severe GERD this week  Reproductive/Obstetrics negative OB ROS                             Anesthesia Physical Anesthesia Plan  ASA: 3  Anesthesia Plan: General and General ETT   Post-op Pain Management:    Induction:   PONV Risk Score and Plan: Propofol infusion  Airway Management Planned:   Additional Equipment:   Intra-op Plan:   Post-operative Plan:   Informed Consent: I have reviewed the patients History and Physical, chart, labs and discussed the procedure including the risks, benefits and alternatives for the proposed anesthesia with the patient or authorized representative who has indicated his/her understanding and acceptance.     Dental Advisory Given  Plan Discussed with: CRNA  Anesthesia Plan Comments:        Anesthesia Quick Evaluation

## 2023-08-07 NOTE — Op Note (Signed)
Orthopaedic Surgery Operative Note (CSN: [id number])  Eduardo Osier Route  1956-06-22 Date of Surgery: 08/07/2023 Preop diagnosis: Right trigger thumb  Post-Op Diagnosis: Same Surgeons:Primary: Vickki Hearing, MD Assistants: No assistance Location: [facility] OR ROOM 4 Anesthesia: General Antibiotics: Ancef 2 g Tourniquet time:  Total Tourniquet Time Documented: Upper Arm (Right) - 14 minutes Total: Upper Arm (Right) - 14 minutes Tourniquet pressure 250 mmHg Estimated Blood Loss: None Complications: None Specimens: None Implants: * No implants in log *  Indications for Surgery: Painful triggering of the right thumb unresponsive to nonoperative care  Procedure:   The patient was seen in the preop area and cleared for surgery.  Thorough chart review completed surgical site confirmed and marked  No implants needed  Patient taken to surgery for general anesthesia in supine position  The right arm was prepped and draped sterilely with a tourniquet applied but not elevated  Timeout was completed  The incision was made over the proximal crease at the MP joint of the right thumb subcutaneous tissue was divided nerves were protected blunt dissection was carried out and to the proximal aspect of the A1 pulley was found it was released sharply.  The tendon was pulled into the wound to check for any other adhesions there were none  The wound was irrigated and closed with several 3-0 nylon sutures in interrupted fashion.   Post-operative plan:  The patient will be seen in 2 weeks for suture removal She can start active range of motion immediately The dressing can be removed in 2 days and a Band-Aid can be applied

## 2023-08-08 ENCOUNTER — Encounter (HOSPITAL_COMMUNITY): Payer: Self-pay | Admitting: Orthopedic Surgery

## 2023-08-08 ENCOUNTER — Ambulatory Visit: Payer: PPO | Admitting: Family Medicine

## 2023-08-08 DIAGNOSIS — E1159 Type 2 diabetes mellitus with other circulatory complications: Secondary | ICD-10-CM

## 2023-08-08 DIAGNOSIS — I10 Essential (primary) hypertension: Secondary | ICD-10-CM | POA: Diagnosis not present

## 2023-08-08 DIAGNOSIS — E785 Hyperlipidemia, unspecified: Secondary | ICD-10-CM

## 2023-08-08 DIAGNOSIS — K3184 Gastroparesis: Secondary | ICD-10-CM | POA: Diagnosis not present

## 2023-08-08 DIAGNOSIS — M25511 Pain in right shoulder: Secondary | ICD-10-CM

## 2023-08-08 DIAGNOSIS — Z79891 Long term (current) use of opiate analgesic: Secondary | ICD-10-CM | POA: Diagnosis not present

## 2023-08-08 DIAGNOSIS — Z23 Encounter for immunization: Secondary | ICD-10-CM

## 2023-08-08 DIAGNOSIS — E1169 Type 2 diabetes mellitus with other specified complication: Secondary | ICD-10-CM

## 2023-08-08 DIAGNOSIS — F321 Major depressive disorder, single episode, moderate: Secondary | ICD-10-CM

## 2023-08-08 DIAGNOSIS — G894 Chronic pain syndrome: Secondary | ICD-10-CM

## 2023-08-08 MED ORDER — HYDROCODONE-ACETAMINOPHEN 10-325 MG PO TABS: ORAL_TABLET | ORAL | 0 refills | Status: DC

## 2023-08-08 NOTE — Progress Notes (Signed)
Subjective:    Patient ID: Jamie Harding, female    DOB: 26-Dec-1955, 67 y.o.   MRN: [id number]  HPI Patient is dealing with depression related symptoms Finds himself feeling down Energy level not as good Not suicidal Partly it is that she has a lot of health issues that cause her trouble including gastroparesis due to her diabetes She also has a husband that does not do much in the way of house upkeep so therefore all falls on her Her son is also in the marriage that has some issues  Patient has diabetes tries to do a good job with her medicines and her diet  She has gastroparesis issues which causes her to have a lot of nausea she tends to eat very small amounts because of this  In addition to this she does have chronic pain and discomfort recently she has had to take an increase amount because of 5 tablets daily because of her wrist pain that this can cause her to run out of medicine sooner  This patient was seen today for chronic pain  The medication list was reviewed and updated.   Location of Pain for which the patient has been treated with regarding narcotics: Lumbar pain and discomfort Patient has significant back pain discomfort that she has tried injections physical therapy and NSAIDs for. Cannot tolerate NSAIDs because of gastroparesis. Pain medicine does take the edge off her pain allows her to function better. She is under a pain management contract she does do yearly urine drug screens and the PDMP is checked on a regular basis   Onset of this pain: Present for years   -Compliance with medication: Good compliance with medicine  - Number patient states they take daily: Typically 4/day but recently 5/day  -Reason for ongoing use of opioids unable to get relief which is Tylenol NSAIDs are inappropriate because of her gastroparesis  What other measures have been tried outside of opioids Tylenol previously NSAIDs  In the ongoing specialists regarding this condition  none currently  -when was the last dose patient took?  Earlier today  The patient was advised the importance of maintaining medication and not using illegal substances with these.  Here for refills and follow up  The patient was educated that we can provide 3 monthly scripts for their medication, it is their responsibility to follow the instructions.  Side effects or complications from medications: Denies side effects  Patient is aware that pain medications are meant to minimize the severity of the pain to allow their pain levels to improve to allow for better function. They are aware of that pain medications cannot totally remove their pain.  Due for UDT ( at least once per year) (pain management contract is also completed at the time of the UDT): She will be due in December 2024  Scale of 1 to 10 ( 1 is least 10 is most) Your pain level without the medicine: 9 Your pain level with medication 6  Scale 1 to 10 ( 1-helps very little, 10 helps very well) How well does your pain medication reduce your pain so you can function better through out the day? 8  Quality of the pain: Throbbing aching  Persistence of the pain: Present all the time  Modifying factors: Worse with activity      Review of Systems     Objective:   Physical Exam  General-in no acute distress Eyes-no discharge Lungs-respiratory rate normal, CTA CV-no murmurs,RRR Extremities skin warm dry  no edema Neuro grossly normal Behavior normal, alert  Results for orders placed or performed during the hospital encounter of 08/07/23  Glucose, capillary  Result Value Ref Range   Glucose-Capillary 142 (H) 70 - 99 mg/dL   CBC does not show any anemia, white count looks good, A1c 7.0, cholesterol profile looks very good, kidney function stable,     Assessment & Plan:  1. Gastroparesis Small meals frequently as a best approach there is not much 1 can do for this issue  2. Chronic pain syndrome Significant  chronic pain see documentation above The patient was seen in followup for chronic pain. A review over at their current pain status was discussed. Drug registry was checked. Prescriptions were given.  Regular follow-up recommended. Discussion was held regarding the importance of compliance with medication as well as pain medication contract.  Patient was informed that medication may cause drowsiness and should not be combined  with other medications/alcohol or street drugs. If the patient feels medication is causing altered alertness then do not drive or operate dangerous equipment.  Should be noted that the patient appears to be meeting appropriate use of opioids and response.  Evidenced by improved function and decent pain control without significant side effects and no evidence of overt aberrancy issues.  Upon discussion with the patient today they understand that opioid therapy is optional and they feel that the pain has been refractory to reasonable conservative measures and is significant and affecting quality of life enough to warrant ongoing therapy and wishes to continue opioids.  Refills were provided.  [facility] medical Board guidelines regarding the pain medicine has been reviewed.  CDC guidelines most updated 2022 has been reviewed by the prescriber.  PDMP is checked on a regular basis yearly urine drug screen and pain management contract   3. Essential hypertension Blood pressure good control continue current measures  4. Type 2 diabetes mellitus with vascular disease (HCC) Diabetes decent control continue current measures  5. Hyperlipidemia associated with type 2 diabetes mellitus (HCC) Previous lab work looked good continue current measures  6. Long-term current use of opiate analgesic The patient was seen in followup for chronic pain. A review over at their current pain status was discussed. Drug registry was checked. Prescriptions were given.  Regular follow-up  recommended. Discussion was held regarding the importance of compliance with medication as well as pain medication contract.  Patient was informed that medication may cause drowsiness and should not be combined  with other medications/alcohol or street drugs. If the patient feels medication is causing altered alertness then do not drive or operate dangerous equipment.  Should be noted that the patient appears to be meeting appropriate use of opioids and response.  Evidenced by improved function and decent pain control without significant side effects and no evidence of overt aberrancy issues.  Upon discussion with the patient today they understand that opioid therapy is optional and they feel that the pain has been refractory to reasonable conservative measures and is significant and affecting quality of life enough to warrant ongoing therapy and wishes to continue opioids.  Refills were provided.  [facility] medical Board guidelines regarding the pain medicine has been reviewed.  CDC guidelines most updated 2022 has been reviewed by the prescriber.  PDMP is checked on a regular basis yearly urine drug screen and pain management contract Drug registry checked We will allow for her to have 5/day on the next prescription for maximum of 15 days then on her current  prescription she can do 5/day until she is finished Then we will go back to 4/day follow-up in 3 months  7. Depression, major, single episode, moderate (HCC) Referral for counseling - Ambulatory referral to Psychology  8. Acute pain of right shoulder Patient states she will follow-up with orthopedics  9. Needs flu shot Today - Flu Vaccine Trivalent High Dose (Fluad)  Recheck 3 months

## 2023-08-15 ENCOUNTER — Other Ambulatory Visit: Payer: Self-pay | Admitting: Family Medicine

## 2023-08-16 MED ORDER — VENLAFAXINE HCL 75 MG PO TABS: ORAL_TABLET | ORAL | 1 refills | Status: DC

## 2023-08-16 MED ORDER — PROMETHAZINE HCL 25 MG PO TABS: ORAL_TABLET | ORAL | 3 refills | Status: DC

## 2023-08-21 ENCOUNTER — Other Ambulatory Visit (HOSPITAL_COMMUNITY): Payer: Self-pay | Admitting: Family Medicine

## 2023-08-21 ENCOUNTER — Ambulatory Visit (HOSPITAL_COMMUNITY): Admission: RE | Admit: 2023-08-21 | Payer: PPO | Source: Ambulatory Visit

## 2023-08-21 ENCOUNTER — Ambulatory Visit: Payer: PPO | Admitting: Radiology

## 2023-08-21 DIAGNOSIS — Z1231 Encounter for screening mammogram for malignant neoplasm of breast: Secondary | ICD-10-CM

## 2023-08-21 DIAGNOSIS — M65311 Trigger thumb, right thumb: Secondary | ICD-10-CM

## 2023-08-21 NOTE — Progress Notes (Signed)
Incision slightly open, removed every other suture, she has appointment tomorrow for her shoulder we will look at hand then also.   Surgery right trigger thumb A1 pulley release 08/07/23  Advised patient to use a bandaid follow up tomorrow as scheduled, until then keep clean and dry.

## 2023-08-22 ENCOUNTER — Encounter: Payer: Self-pay | Admitting: Orthopedic Surgery

## 2023-08-22 ENCOUNTER — Ambulatory Visit: Payer: PPO | Admitting: Orthopedic Surgery

## 2023-08-22 VITALS — BP 114/78 | HR 112 | Ht 62.0 in | Wt 172.0 lb

## 2023-08-22 DIAGNOSIS — M7521 Bicipital tendinitis, right shoulder: Secondary | ICD-10-CM

## 2023-08-22 MED ORDER — PREDNISONE 10 MG PO TABS: 10.0000 mg | ORAL_TABLET | Freq: Three times a day (TID) | ORAL | 0 refills | Status: DC

## 2023-08-22 NOTE — Progress Notes (Signed)
Patient: Jamie Harding           Date of Birth: July 01, 1956           MRN: [id number] Visit Date: 08/22/2023 Requested by: Babs Sciara, MD [address] [place],  [place] [address] PCP: Babs Sciara, MD    Encounter Diagnosis  Name Primary?   Tendinitis of long head of biceps brachii of right shoulder Yes    Plan:  Prednisone Dosepak for the tendinitis  No heavy lifting  2-week follow-up  Sutures are removed.  Use Neosporin and keep covered check in 2 weeks does not look infected  Chief Complaint  Patient presents with          Shoulder Pain    Right/ has xrays and notes from quick care    Post of right thumb DOS 08-07-2023  came in office yesterday and Amy removed 2 stiches and left the remaining for Dr Romeo Apple to see today. No pain no swelling or pain    Jamie Harding is in the postoperative period.  But has a new problem in her right shoulder  Apparently she had 2 falls injured her right shoulder 22 August.  She went to urgent care x-ray showed a normal shoulder with the exception of a small calcific lesion in the rotator cuff tendon  They advised her to take extra strength Tylenol  She is also here for repeat wound check status post trigger finger release this right thumb Stitch she is due for removal but the incision seem to be opening up.    Body mass index is 31.46 kg/m.   Problem list, medical hx, medications and allergies reviewed   Review of Systems  Constitutional:  Positive for fever.  Neurological:        Right thumb has some numbness     Allergies  Allergen Reactions   Byetta 10 Mcg Pen [Exenatide] Diarrhea and Nausea And Vomiting      touch of pancreatis   Naproxen Nausea And Vomiting   Pollen Extract    Augmentin [Amoxicillin-Pot Clavulanate]     diarrhea   Azithromycin Nausea And Vomiting and Rash   Erythromycin Hives        Invokana [Canagliflozin]     Urinary issues,yeast infection   Morphine Hives and Rash   Sulfonamide  Derivatives Nausea And Vomiting and Rash    BP 114/78   Pulse (!) 112   Ht 5\' 2"  (1.575 m)   Wt 172 lb (78 kg)   BMI 31.46 kg/m    Physical exam: Physical Exam Vitals and nursing note reviewed.  Constitutional:      Appearance: Normal appearance.  HENT:     Head: Normocephalic and atraumatic.  Eyes:     General: No scleral icterus.       Right eye: No discharge.        Left eye: No discharge.     Extraocular Movements: Extraocular movements intact.     Conjunctiva/sclera: Conjunctivae normal.     Pupils: Pupils are equal, round, and reactive to light.  Cardiovascular:     Rate and Rhythm: Normal rate.     Pulses: Normal pulses.  Skin:    General: Skin is warm and dry.     Capillary Refill: Capillary refill takes less than 2 seconds.  Neurological:     General: No focal deficit present.     Mental Status: She is alert and oriented to person, place, and time.  Psychiatric:  Mood and Affect: Mood normal.        Behavior: Behavior normal.        Thought Content: Thought content normal.        Judgment: Judgment normal.     Ortho Exam  MSK:  Right shoulder tenderness over the front of the shoulder and into the biceps rotator cuff strength is normal no pain with overhead pivoting pain with extension of the shoulder joint pain all referred to the front of the shoulder  Data reviewed:   Image(s) reviewed with personal interpretation:  Outside imaging normal glenohumeral joint small calcification more likely chronic  Assessment and plan:  Encounter Diagnosis  Name Primary?   Tendinitis of long head of biceps brachii of right shoulder Yes    Oral prednisone patient is on Plavix no NSAIDs   Meds ordered this encounter  Medications   predniSONE (DELTASONE) 10 MG tablet    Sig: Take 1 tablet (10 mg total) by mouth 3 (three) times daily.    Dispense:  42 tablet    Refill:  0    Procedures:   No injections

## 2023-08-26 ENCOUNTER — Other Ambulatory Visit: Payer: Self-pay | Admitting: Gastroenterology

## 2023-08-26 DIAGNOSIS — K3184 Gastroparesis: Secondary | ICD-10-CM

## 2023-09-03 ENCOUNTER — Other Ambulatory Visit: Payer: Self-pay

## 2023-09-03 ENCOUNTER — Telehealth: Payer: Self-pay | Admitting: Family Medicine

## 2023-09-03 MED ORDER — METFORMIN HCL 1000 MG PO TABS: ORAL_TABLET | ORAL | 1 refills | Status: DC

## 2023-09-03 NOTE — Telephone Encounter (Signed)
Refill on metFORMIN (GLUCOPHAGE) 1000 MG tablet send to [place] completely out .

## 2023-09-05 ENCOUNTER — Inpatient Hospital Stay (HOSPITAL_COMMUNITY): Admission: RE | Admit: 2023-09-05 | Payer: PPO | Source: Ambulatory Visit

## 2023-09-05 ENCOUNTER — Encounter (HOSPITAL_COMMUNITY): Payer: Self-pay

## 2023-09-05 ENCOUNTER — Encounter: Payer: PPO | Admitting: Orthopedic Surgery

## 2023-09-05 DIAGNOSIS — Z1231 Encounter for screening mammogram for malignant neoplasm of breast: Secondary | ICD-10-CM

## 2023-09-09 ENCOUNTER — Ambulatory Visit (INDEPENDENT_AMBULATORY_CARE_PROVIDER_SITE_OTHER): Payer: PPO | Admitting: Orthopedic Surgery

## 2023-09-09 DIAGNOSIS — M7521 Bicipital tendinitis, right shoulder: Secondary | ICD-10-CM

## 2023-09-09 MED ORDER — METHYLPREDNISOLONE ACETATE 40 MG/ML IJ SUSP
40.0000 mg | Freq: Once | INTRAMUSCULAR | Status: AC
Start: 2023-09-09 — End: 2023-09-09
  Administered 2023-09-09: 40 mg via INTRA_ARTICULAR

## 2023-09-09 NOTE — Addendum Note (Signed)
Addended byCaffie Damme on: 09/09/2023 04:21 PM   Modules accepted: Orders

## 2023-09-09 NOTE — Progress Notes (Signed)
Chief Complaint  Patient presents with   Follow-up    Recheck on right shoulder.   Encounter Diagnosis  Name Primary?   Tendinitis of long head of biceps brachii of right shoulder Yes    Working diagnosis is biceps tendinitis proximal aspect status post course of prednisone   Today:  She did well with the medication until she fell again, now pain again in the front of the arm exacerbated by extension of the arm.    August 22, 2023  Jamie Harding is in the postoperative period.  But has a new problem in her right shoulder   Apparently she had 2 falls injured her right shoulder 22 August.  She went to urgent care x-ray showed a normal shoulder with the exception of a small calcific lesion in the rotator cuff tendon   They advised her to take extra strength Tylenol  Encounter Diagnosis  Name Primary?   Tendinitis of long head of biceps brachii of right shoulder Yes    Try injection x 1  Procedure: Biceps tendon injection Right biceps tendon sheath was injected The patient gave verbal consent for cortisone injection Timeout confirmed the site of injection Medications used included 40 mg of Depo-Medrol and 3 mL 1% lidocaine After alcohol and ethyl chloride preparation the point of maximal tenderness was injected over the right biceps tendon there were no complications   F/u 4 weeks

## 2023-09-25 ENCOUNTER — Ambulatory Visit: Payer: PPO | Admitting: Nurse Practitioner

## 2023-09-25 ENCOUNTER — Encounter: Payer: Self-pay | Admitting: Nurse Practitioner

## 2023-09-25 VITALS — BP 143/80 | HR 87 | Temp 97.7°F | Ht 62.0 in | Wt 176.2 lb

## 2023-09-25 DIAGNOSIS — S21209A Unspecified open wound of unspecified back wall of thorax without penetration into thoracic cavity, initial encounter: Secondary | ICD-10-CM | POA: Diagnosis not present

## 2023-09-25 DIAGNOSIS — L03312 Cellulitis of back [any part except buttock]: Secondary | ICD-10-CM

## 2023-09-25 DIAGNOSIS — T148XXA Other injury of unspecified body region, initial encounter: Secondary | ICD-10-CM | POA: Insufficient documentation

## 2023-09-25 MED ORDER — DOXYCYCLINE HYCLATE 100 MG PO CAPS: 100.0000 mg | ORAL_CAPSULE | Freq: Two times a day (BID) | ORAL | 0 refills | Status: DC

## 2023-09-25 MED ORDER — MUPIROCIN 2 % EX OINT: TOPICAL_OINTMENT | CUTANEOUS | 0 refills | Status: DC

## 2023-09-25 NOTE — Progress Notes (Addendum)
   Subjective:    Patient ID: Jamie Harding, female    DOB: 01-Dec-1955, 67 y.o.   MRN: [id number]  HPI Presents for complaints of a sore on her back that has been there for about a month.  Slightly tender today.  No fever.  Has tried cleaning it and applying triamcinolone with no relief.  Nonpruritic.  Has several other areas on her legs.  The ones on her arms have healed.  Last tetanus vaccine from our records was 2008.  No known contacts.   Review of Systems  Constitutional:  Negative for fever.  Respiratory:  Negative for cough, chest tightness and shortness of breath.   Cardiovascular:  Negative for chest pain.  Skin:  Positive for rash and wound.       Objective:   Physical Exam NAD.  Alert, oriented.  Lungs clear.  Heart regular rate rhythm.  In the upper central part of the back is a superficial round well-defined 3 cm wound with mild surrounding erythema.  Tenderness noted particularly around 5-6 o'clock where there is a slight white area with slight scabbing.  Several other areas noted on the body in various stages of healing.  3 on the upper back.  And a few on the lower legs.  The areas on the lower leg are well-defined slightly raised with a brownish core.  Nontender to palpation. Today's Vitals   09/25/23 0943  BP: (!) 143/80  Pulse: 87  Temp: 97.7 F (36.5 C)  SpO2: 99%  Weight: 176 lb 3.2 oz (79.9 kg)  Height: 5\' 2"  (1.575 m)   Body mass index is 32.23 kg/m.        Assessment & Plan:   Problem List Items Addressed This Visit       Musculoskeletal and Integument   Cellulitis of upper back excluding scapular region - Primary   Multiple wounds of skin   Meds ordered this encounter  Medications   doxycycline (VIBRAMYCIN) 100 MG capsule    Sig: Take 1 capsule (100 mg total) by mouth 2 (two) times daily.    Dispense:  20 capsule    Refill:  0    Order Specific Question:   Supervising Provider    Answer:   [name] A [[id number]]   mupirocin ointment  (BACTROBAN) 2 %    Sig: Apply BID to rash prn    Dispense:  22 g    Refill:  0    Order Specific Question:   Supervising Provider    Answer:   Babs Sciara [[id number]]   Reviewed proper wound care.  Start doxycycline as directed.  Apply Bactroban ointment as directed.  Warm compresses.  Keep area clean and dry as possible.  Warning signs reviewed.  Recheck next week, call back sooner if worsening erythema or other symptoms. Medicare will not cover tetanus vaccine in our office.  Must be done through local pharmacy.  Encourage patient to get tetanus booster there.

## 2023-10-02 ENCOUNTER — Ambulatory Visit: Payer: PPO | Admitting: Family Medicine

## 2023-10-02 VITALS — BP 110/60 | HR 75 | Wt 177.0 lb

## 2023-10-02 DIAGNOSIS — K3184 Gastroparesis: Secondary | ICD-10-CM

## 2023-10-02 DIAGNOSIS — L03312 Cellulitis of back [any part except buttock]: Secondary | ICD-10-CM | POA: Diagnosis not present

## 2023-10-02 DIAGNOSIS — K5903 Drug induced constipation: Secondary | ICD-10-CM | POA: Diagnosis not present

## 2023-10-02 MED ORDER — DOXYCYCLINE HYCLATE 100 MG PO CAPS: 100.0000 mg | ORAL_CAPSULE | Freq: Two times a day (BID) | ORAL | 0 refills | Status: DC

## 2023-10-02 MED ORDER — NALOXEGOL OXALATE 25 MG PO TABS: 25.0000 mg | ORAL_TABLET | Freq: Every day | ORAL | 5 refills | Status: DC

## 2023-10-02 NOTE — Progress Notes (Signed)
   Subjective:    Patient ID: Jamie Harding, female    DOB: [birth year], 67 y.o.   MRN: [id number]  Discussed the use of AI scribe software for clinical note transcription with the patient, who gave verbal consent to proceed.  History of Present Illness   The patient, with a history of diabetes and gastroparesis, presents with multiple skin lesions. The most significant lesion is a large, approximately 1-inch by 1-inch, area on the back. This lesion was initially treated with doxycycline and Bactroban, and she was also cleaning it with peroxide. She reports smaller lesions on the right ankle and left leg, which have remained relatively stable.  She has been managing these lesions with Bactroban and doxycycline, applied twice daily, and cleaning with peroxide. She reports no significant discomfort or pain from these lesions, except for the large lesion on the back, which was painful to touch.  In addition to the skin lesions, she also reports a long-standing issue with bowel movements, dating back to her teenage years. This has been managed with Movantik, which she reports as being effective without causing excessive bathroom visits. She has been unable to obtain a refill of Movantik and plans to do so after an upcoming colonoscopy.         Review of Systems     Objective:    Physical Exam   SKIN: One lesion on the back approximately 1 inch by 1 inch, smaller lesions on the right ankle and lower extremities. Lesions appear to be improving with treatment.           Assessment & Plan:  Assessment and Plan    Skin Infections Multiple skin infections, largest on the back measuring approximately 1 inch by 1 inch. Currently on doxycycline and Bactroban. Patient was previously cleaning with peroxide. -Continue doxycycline for an additional week. -Continue Bactroban daily. -Shift to cleaning with soap and water, avoiding peroxide. -Notify provider if any new areas of redness, soreness, or  blistering develop.  Chronic Constipation History of chronic constipation, possibly related to gastroparesis and pain medication use. Previously had good response to Movantik. -[facility] -Continue regular follow-up appointment on November 07, 2023.

## 2023-10-03 ENCOUNTER — Telehealth: Payer: Self-pay | Admitting: *Deleted

## 2023-10-03 NOTE — Telephone Encounter (Signed)
Patient stated she has a GI doctor currently who has prescribed this for her and she will call their office and ask for a refill

## 2023-10-03 NOTE — Telephone Encounter (Signed)
I would advise the patient that this is a specialty medicine and because it is especially medicine she stands a much better chance of having this approved from gastroenterology.  Lets make sure that she has an appointment or she is getting an appointment with gastroenterology Send me a reply and I will also forward request to her gastroenterology office (In other words our chance of getting it approved is very low because we are family physicians)

## 2023-10-03 NOTE — Telephone Encounter (Signed)
Prior Auth request sent over for Movantik.

## 2023-10-07 ENCOUNTER — Ambulatory Visit: Payer: PPO | Admitting: Orthopedic Surgery

## 2023-10-28 ENCOUNTER — Ambulatory Visit: Payer: PPO | Admitting: Orthopedic Surgery

## 2023-10-28 DIAGNOSIS — S46211A Strain of muscle, fascia and tendon of other parts of biceps, right arm, initial encounter: Secondary | ICD-10-CM

## 2023-10-28 DIAGNOSIS — M7521 Bicipital tendinitis, right shoulder: Secondary | ICD-10-CM | POA: Diagnosis not present

## 2023-10-28 NOTE — Progress Notes (Signed)
  Office Visit Note   Patient: DIONA RUISI           Date of Birth: [birth year]           MRN: [id number] Visit Date: 10/28/2023 Requested by: Babs Sciara, MD [address] [place],  [place] [address] PCP: Babs Sciara, MD   Chief Complaint  Patient presents with   Arm Injury    Picked up her dog and right  arm popped x2 weeks     Encounter Diagnoses  Name Primary?   Tendinitis of long head of biceps brachii of right shoulder Yes   Rupture of right proximal biceps tendon, initial encounter    There is no height or weight on file to calculate BMI.  Status post injection right biceps tendon presents with acute pain right biceps with acute deformity and some ecchymosis and bruising  Exam shows a classic Popeye sign and tenderness in the muscle of the right biceps  IMAGING: No results found.  MANAGEMENT   Observation  Return as needed  No orders of the defined types were placed in this encounter.   PROCEDURES: No seizures   Fuller Canada, MD  10/28/2023 2:35 PM

## 2023-11-07 ENCOUNTER — Ambulatory Visit (INDEPENDENT_AMBULATORY_CARE_PROVIDER_SITE_OTHER): Payer: PPO | Admitting: Family Medicine

## 2023-11-07 VITALS — BP 128/64 | HR 82 | Temp 98.6°F | Ht 62.0 in | Wt 175.6 lb

## 2023-11-07 DIAGNOSIS — G894 Chronic pain syndrome: Secondary | ICD-10-CM

## 2023-11-07 DIAGNOSIS — E1169 Type 2 diabetes mellitus with other specified complication: Secondary | ICD-10-CM | POA: Diagnosis not present

## 2023-11-07 DIAGNOSIS — N3000 Acute cystitis without hematuria: Secondary | ICD-10-CM | POA: Diagnosis not present

## 2023-11-07 DIAGNOSIS — E1159 Type 2 diabetes mellitus with other circulatory complications: Secondary | ICD-10-CM | POA: Diagnosis not present

## 2023-11-07 DIAGNOSIS — Z79891 Long term (current) use of opiate analgesic: Secondary | ICD-10-CM

## 2023-11-07 DIAGNOSIS — E785 Hyperlipidemia, unspecified: Secondary | ICD-10-CM

## 2023-11-07 DIAGNOSIS — R300 Dysuria: Secondary | ICD-10-CM

## 2023-11-07 MED ORDER — CEPHALEXIN 500 MG PO CAPS: 500.0000 mg | ORAL_CAPSULE | Freq: Three times a day (TID) | ORAL | 0 refills | Status: DC

## 2023-11-07 MED ORDER — CLOPIDOGREL BISULFATE 75 MG PO TABS: 75.0000 mg | ORAL_TABLET | Freq: Every day | ORAL | 1 refills | Status: DC

## 2023-11-07 MED ORDER — ROSUVASTATIN CALCIUM 20 MG PO TABS: 20.0000 mg | ORAL_TABLET | Freq: Every day | ORAL | 1 refills | Status: DC

## 2023-11-07 MED ORDER — HYDROCODONE-ACETAMINOPHEN 10-325 MG PO TABS: ORAL_TABLET | ORAL | 0 refills | Status: DC

## 2023-11-07 MED ORDER — LISINOPRIL 2.5 MG PO TABS: 2.5000 mg | ORAL_TABLET | Freq: Every day | ORAL | 1 refills | Status: DC

## 2023-11-07 NOTE — Progress Notes (Signed)
Subjective:    Patient ID: Jamie Harding, female    DOB: [birth year], 67 y.o.   MRN: [id number]  Discussed the use of AI scribe software for clinical note transcription with the patient, who gave verbal consent to proceed.  History of Present Illness   The patient, with a history of diabetes and previous stroke, presents with recurrent falls. She describes episodes of weakness, particularly when carrying items or ascending stairs. The most recent fall occurred after standing for an extended period and stepping off a curb in low light conditions. She reports no significant injuries from the falls, but has noticed persistent back and neck pain since the last incident.  The patient also reports a sensation of dizziness, which has led to instances of unsteadiness and leaning against walls for support. She has not identified any specific triggers for these episodes.  In addition to the falls and dizziness, the patient has recently experienced urinary symptoms, including stinging and burning, which began earlier in the week. She has not identified any associated symptoms or triggers for this new complaint.  The patient's diabetes management appears to be effective, with a satisfactory A1c level reported from her last blood work. She is also on blood pressure medication, which has helped control her readings. Despite this, the patient still experiences episodes of dizziness.         Review of Systems     Objective:    Physical Exam   VITALS: BP- 128/64 MUSCULOSKELETAL: Normal range of motion and strength in lower extremities. NEUROLOGICAL: Sensation to light touch intact on dorsal aspect of feet, decreased sensation on plantar aspect. Proprioception intact in feet.     General-in no acute distress Eyes-no discharge Lungs-respiratory rate normal, CTA CV-no murmurs,RRR Extremities skin warm dry no edema Neuro grossly normal Behavior normal, alert       Assessment & Plan:  Assessment  and Plan    Frequent Falls Reports frequent falls due to leg weakness, particularly when carrying objects or navigating stairs. Recent fall after standing for an extended period and navigating a curb in low light. Sensory examination of feet reveals preserved positional sense but some areas of decreased light touch sensation. Discussed the role of sensory changes in balance and risk of falls, particularly in low light conditions. -Advised to use a cane or staff when navigating uneven surfaces or in low light conditions. -Encouraged to maintain good lighting when moving around at night.  Urinary Symptoms Reports recent onset of urinary symptoms including stinging and burning. -Plan to examine urine under microscope to assess for possible infection.  Hypertension Blood pressure well controlled on current medication regimen. -Continue current antihypertensive medication.  General Health Maintenance -Next A1c and routine blood work due before next visit.     Mild UTI treated with Keflex 5 days The patient was seen in followup for chronic pain. A review over at their current pain status was discussed. Drug registry was checked. Prescriptions were given.  Regular follow-up recommended. Discussion was held regarding the importance of compliance with medication as well as pain medication contract.  Patient was informed that medication may cause drowsiness and should not be combined  with other medications/alcohol or street drugs. If the patient feels medication is causing altered alertness then do not drive or operate dangerous equipment.  Should be noted that the patient appears to be meeting appropriate use of opioids and response.  Evidenced by improved function and decent pain control without significant side effects and no evidence of  overt aberrancy issues.  Upon discussion with the patient today they understand that opioid therapy is optional and they feel that the pain has been refractory  to reasonable conservative measures and is significant and affecting quality of life enough to warrant ongoing therapy and wishes to continue opioids.  Refills were provided.  [facility] medical Board guidelines regarding the pain medicine has been reviewed.  CDC guidelines most updated 2022 has been reviewed by the prescriber.  PDMP is checked on a regular basis yearly urine drug screen and pain management contract  No lab work currently but repeat lab work before next follow-up visit   1. Dysuria (Primary) Go ahead and treat for UTI - Urine Culture  2. Acute cystitis without hematuria Keflex 5 days warning signs discussed  3. Chronic pain syndrome See documentation above - ToxASSURE Select 13 (MW), Urine  4. Type 2 diabetes mellitus with vascular disease (HCC) Check A1c before next visit continue current medication  5. Hyperlipidemia associated with type 2 diabetes mellitus (HCC) Continue current medication healthy diet check labs for next visit  6. Long-term current use of opiate analgesic Urine drug screen - ToxASSURE Select 13 (MW), Urine Follow-up 3 months

## 2023-11-08 ENCOUNTER — Encounter: Payer: Self-pay | Admitting: Family Medicine

## 2023-11-08 LAB — MED LIST OPTION NOT SELECTED

## 2023-11-09 LAB — SPECIMEN STATUS REPORT

## 2023-11-09 LAB — URINE CULTURE

## 2023-11-10 ENCOUNTER — Other Ambulatory Visit: Payer: Self-pay | Admitting: Family Medicine

## 2023-11-10 MED ORDER — HYDROCODONE-ACETAMINOPHEN 10-325 MG PO TABS: ORAL_TABLET | ORAL | 0 refills | Status: DC

## 2023-11-11 ENCOUNTER — Other Ambulatory Visit: Payer: Self-pay | Admitting: *Deleted

## 2023-11-11 ENCOUNTER — Other Ambulatory Visit: Payer: Self-pay | Admitting: Family Medicine

## 2023-11-11 DIAGNOSIS — I10 Essential (primary) hypertension: Secondary | ICD-10-CM

## 2023-11-11 DIAGNOSIS — E1169 Type 2 diabetes mellitus with other specified complication: Secondary | ICD-10-CM

## 2023-11-11 DIAGNOSIS — E1159 Type 2 diabetes mellitus with other circulatory complications: Secondary | ICD-10-CM

## 2023-11-11 MED ORDER — GABAPENTIN 100 MG PO CAPS: 100.0000 mg | ORAL_CAPSULE | Freq: Three times a day (TID) | ORAL | 3 refills | Status: DC

## 2023-11-13 LAB — TOXASSURE SELECT 13 (MW), URINE

## 2023-11-13 LAB — SPECIMEN STATUS REPORT

## 2023-12-04 ENCOUNTER — Encounter: Payer: Self-pay | Admitting: Orthopedic Surgery

## 2023-12-19 ENCOUNTER — Encounter: Payer: Self-pay | Admitting: Family Medicine

## 2023-12-19 ENCOUNTER — Other Ambulatory Visit: Payer: Self-pay | Admitting: Family Medicine

## 2023-12-19 DIAGNOSIS — E1159 Type 2 diabetes mellitus with other circulatory complications: Secondary | ICD-10-CM

## 2023-12-24 ENCOUNTER — Other Ambulatory Visit: Payer: Self-pay | Admitting: Family Medicine

## 2023-12-24 NOTE — Telephone Encounter (Signed)
Called pt , was not able to leave a message , mailbox is full, need to verify if she is requesting fluconazole and current symptoms.

## 2023-12-27 NOTE — Telephone Encounter (Signed)
Mailbox is full, could not leave a message .

## 2023-12-30 ENCOUNTER — Other Ambulatory Visit: Payer: Self-pay | Admitting: Family Medicine

## 2023-12-31 ENCOUNTER — Other Ambulatory Visit: Payer: Self-pay

## 2024-01-06 ENCOUNTER — Other Ambulatory Visit: Payer: Self-pay | Admitting: Family Medicine

## 2024-01-06 ENCOUNTER — Telehealth: Payer: Self-pay | Admitting: Family Medicine

## 2024-01-06 MED ORDER — BUSPIRONE HCL 10 MG PO TABS: ORAL_TABLET | ORAL | 5 refills | Status: DC

## 2024-01-06 NOTE — Telephone Encounter (Signed)
 Refill busPIRone (BUSPAR) 10 MG tablet  [facility]

## 2024-01-06 NOTE — Telephone Encounter (Signed)
 Refills were sent to [facility] thank you

## 2024-01-17 ENCOUNTER — Encounter: Payer: Self-pay | Admitting: Family Medicine

## 2024-01-24 ENCOUNTER — Encounter: Payer: Self-pay | Admitting: Family Medicine

## 2024-01-24 ENCOUNTER — Ambulatory Visit: Payer: PPO | Admitting: Family Medicine

## 2024-01-24 VITALS — BP 137/82 | HR 87 | Temp 97.2°F | Ht 62.0 in | Wt 172.0 lb

## 2024-01-24 DIAGNOSIS — I10 Essential (primary) hypertension: Secondary | ICD-10-CM

## 2024-01-24 DIAGNOSIS — G894 Chronic pain syndrome: Secondary | ICD-10-CM

## 2024-01-24 DIAGNOSIS — F321 Major depressive disorder, single episode, moderate: Secondary | ICD-10-CM | POA: Diagnosis not present

## 2024-01-24 DIAGNOSIS — E1159 Type 2 diabetes mellitus with other circulatory complications: Secondary | ICD-10-CM | POA: Diagnosis not present

## 2024-01-24 DIAGNOSIS — E1169 Type 2 diabetes mellitus with other specified complication: Secondary | ICD-10-CM

## 2024-01-24 DIAGNOSIS — M25511 Pain in right shoulder: Secondary | ICD-10-CM

## 2024-01-24 DIAGNOSIS — E785 Hyperlipidemia, unspecified: Secondary | ICD-10-CM

## 2024-01-24 NOTE — Progress Notes (Signed)
 Thanks  Subjective:    Patient ID: Jamie Harding, female    DOB: 06/17/1956, 68 y.o.   MRN: [id number]  Discussed the use of AI scribe software for clinical note transcription with the patient, who gave verbal consent to proceed.  History of Present Illness   The patient, with diabetes, presents with dizziness and falls.  She experiences dizziness characterized by an unsteady feeling, leading to falls. The dizziness is often triggered by bending over, standing up quickly, or after sitting for a while. She needs to stand still for a moment after standing to prevent swaying. The dizziness can occur at any time, even while walking, and she uses a cane for balance, although she notes it makes her look 'like a drunk walking down the road'.  She has orthostatic hypotension, with significant blood pressure drops upon standing, contributing to her dizziness and risk of falls. She recalls being taken off amlodipine previously due to low blood pressure and currently takes lisinopril for kidney protection.  She has a history of diabetes and reports doing well with her blood sugar management, with no recent hypoglycemic episodes. She checks her blood sugar regularly and has reduced her long-acting insulin to about ten units twice a day, down from fifty. She occasionally uses NovoLog if her blood sugar exceeds 200, but avoids it due to discomfort from low sugar levels. She is compliant with her cholesterol medication.  She experiences chronic pain, for which she takes pain medication approximately four times a day, sometimes five if needed. She mentions a recent visit to Dr. Romeo Apple for arm pain, diagnosed as tendinitis, for which she received a steroid shot. She later experienced a tendon pop while lifting her dog, leading to persistent dull ache in her shoulder, which worsens at night and disrupts her sleep. She has not engaged in any range of motion exercises due to the pain.  She mentions hearing loss and  considers seeing an ear doctor. She also discusses mood fluctuations, feeling better at times but also experiencing setbacks, particularly related to stress from caring for her husband, Remigio Eisenmenger. 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     This patient was seen today for chronic pain  The medication list was reviewed and updated.   Location of Pain for which the patient has been treated with regarding narcotics: Low back pain knee pain Patient with also painful neuropathy in lower legs  Onset of this pain: Present for years   -Compliance with medication: Good compliance with medicine  - Number patient states they take daily: Approximately 4/day  -Reason for ongoing use of opioids cannot get adequate relief with Tylenol NSAIDs  What other measures have been tried outside of opioids Tylenol NSAIDs  In the ongoing specialists regarding this condition none regarding back currently  -when was the last dose patient took?  Within the past 24 hours  The patient was advised the importance of maintaining medication and not using illegal substances with these.  Here for refills and follow up  The patient was educated that we can provide 3 monthly scripts for their medication, it is their responsibility to follow the instructions.  Side effects or complications from medications: Denies side effects  Patient is aware that pain medications are meant to minimize the severity of the pain to allow their pain levels to improve to allow for better function. They are aware of that pain medications cannot totally remove their pain.  Due for UDT (  at least once per year) (pain management contract is also completed at the time of the UDT): December  Scale of 1 to 10 ( 1 is least 10 is most) Your pain level without the medicine: 10 Your pain level with medication 4-5  Scale 1 to 10 ( 1-helps very little, 10 helps very well) How well does your pain  medication reduce your pain so you can function better through out the day?  10  Quality of the pain: Throbbing aching  Persistence of the pain: Present all the time  Modifying factors: Worse with activity         Review of Systems     Objective:    Physical Exam   VITALS: BP- 112/70 CARDIOVASCULAR: Orthostatic hypotension.     General-in no acute distress Eyes-no discharge Lungs-respiratory rate normal, CTA CV-no murmurs,RRR Extremities skin warm dry no edema Neuro grossly normal Behavior normal, alert Blood pressure does drop with standing.    Right shoulder pain discomfort with internal rotation raising the arm Assessment & Plan:  Assessment and Plan    Orthostatic Hypotension Experiences dizziness and unsteadiness upon standing with significant blood pressure drop. Increased fall risk. Diabetes may affect autonomic regulation. - Increase dietary salt intake. - Reduce lisinopril to every other day. - Perform lab work to assess current health status.  Diabetes Mellitus Diabetes well-controlled with no recent hypoglycemic episodes. Discussed potential impact of steroids on blood sugar. - Perform lab work to monitor diabetes control.  Biceps Tendon Rupture Likely biceps tendon rupture with muscle bunching and dull ache affecting sleep. Advised against anti-inflammatory medications due to gastrointestinal bleeding risk. - Refer to physical therapy for shoulder and arm rehabilitation. - Consider x-ray to assess for shoulder arthritis.  Chronic Pain Chronic pain managed with medication. Advised against anti-inflammatory medications due to gastrointestinal bleeding risk. - Update pain medication prescription.  Mental Health Concerns Mood fluctuations and depression related to husband's health issues. Counseling suggested as additional support. - Refer to [facility] for counseling services.  General Health Maintenance Due for a mammogram. Encouraged  physical activity and hydration. - Schedule mammogram. - Encourage physical activity and hydration.     1. Type 2 diabetes mellitus with vascular disease (HCC) (Primary) Try to keep A1c under good control continue current medication.  2. Essential hypertension Blood pressure does drop with standing back off on lisinopril to every other day  3. Chronic pain syndrome Pain medication do not go up beyond what we are currently utilizing  The patient was seen in followup for chronic pain. A review over at their current pain status was discussed. Drug registry was checked. Prescriptions were given.  Regular follow-up recommended. Discussion was held regarding the importance of compliance with medication as well as pain medication contract.  Patient was informed that medication may cause drowsiness and should not be combined  with other medications/alcohol or street drugs. If the patient feels medication is causing altered alertness then do not drive or operate dangerous equipment.  Should be noted that the patient appears to be meeting appropriate use of opioids and response.  Evidenced by improved function and decent pain control without significant side effects and no evidence of overt aberrancy issues.  Upon discussion with the patient today they understand that opioid therapy is optional and they feel that the pain has been refractory to reasonable conservative measures and is significant and affecting quality of life enough to warrant ongoing therapy and wishes to continue opioids.  Refills were provided.  [facility]  [place] medical Board guidelines regarding the pain medicine has been reviewed.  CDC guidelines most updated 2022 has been reviewed by the prescriber.  PDMP is checked on a regular basis yearly urine drug screen and pain management contract  Treatment plan for this patient includes #1-gentle stretching exercises as shown daily basis 2.  Mild strength exercises 3 times per week #3  continue pain medications #4 notify us if any digression   4. Hyperlipidemia associated with type 2 diabetes mellitus (HCC) Continue statin healthy diet  5. Depression, major, single episode, moderate (HCC) Under stress would benefit from counseling - Ambulatory referral to Psychology  6. Acute pain of right shoulder Physical therapy recommended - Ambulatory referral to Physical Therapy   Follow-up 3 months

## 2024-01-25 MED ORDER — HYDROCODONE-ACETAMINOPHEN 10-325 MG PO TABS: ORAL_TABLET | ORAL | 0 refills | Status: DC

## 2024-01-27 ENCOUNTER — Other Ambulatory Visit: Payer: Self-pay

## 2024-01-27 DIAGNOSIS — M25511 Pain in right shoulder: Secondary | ICD-10-CM

## 2024-01-27 NOTE — Addendum Note (Signed)
 Addended by: Sande Brothers on: 01/27/2024 11:21 AM   Modules accepted: Orders

## 2024-02-07 ENCOUNTER — Ambulatory Visit: Admitting: Orthopedic Surgery

## 2024-02-10 ENCOUNTER — Ambulatory Visit: Admitting: Orthopedic Surgery

## 2024-02-10 ENCOUNTER — Other Ambulatory Visit (INDEPENDENT_AMBULATORY_CARE_PROVIDER_SITE_OTHER): Payer: Self-pay

## 2024-02-10 DIAGNOSIS — M25511 Pain in right shoulder: Secondary | ICD-10-CM

## 2024-02-10 DIAGNOSIS — S46011D Strain of muscle(s) and tendon(s) of the rotator cuff of right shoulder, subsequent encounter: Secondary | ICD-10-CM

## 2024-02-10 NOTE — Progress Notes (Signed)
   There were no vitals taken for this visit.  There is no height or weight on file to calculate BMI.  Chief Complaint  Patient presents with   Follow-up    Recheck on right shoulder and arm.

## 2024-02-10 NOTE — Progress Notes (Signed)
 Patient ID: Jamie Harding, female   DOB: February 15, 1956, 68 y.o.   MRN: [id number]  Follow-up visit patient 68 years old she had a proximal biceps tendon rupture and is still having trouble her symptoms started back in October, continued into December she got 1 injection.  She had some initial resolution but the pain has recurred  Complains of pain over the right anterior shoulder  She has good strength in abduction and flexion but has pain with extension of the shoulder  X-ray shows abnormalities as well DG Shoulder Right Result Date: 02/10/2024 Right shoulder imaging Proximal biceps tendon tear right shoulder pain Glenohumeral joint looks normal. Humeral head riding slightly high slight decrease in humeral head acromial distance Flat acromion Imaging suggest chronic rotator cuff tear or rotator cuff weakness     Encounter Diagnoses  Name Primary?   Pain, joint, shoulder, right Yes   Traumatic tear of right rotator cuff, unspecified tear extent, subsequent encounter     MRI rule out rotator cuff tear based on x-ray

## 2024-02-10 NOTE — Patient Instructions (Signed)
 While we are working on your approval for MRI please go ahead and call to schedule your appointment with [facility] within at least one (1) week.   Central Scheduling [phone]

## 2024-02-13 ENCOUNTER — Encounter: Payer: Self-pay | Admitting: Orthopedic Surgery

## 2024-02-13 ENCOUNTER — Ambulatory Visit: Admitting: Orthopedic Surgery

## 2024-02-15 ENCOUNTER — Encounter (HOSPITAL_COMMUNITY): Payer: Self-pay

## 2024-02-15 ENCOUNTER — Ambulatory Visit (HOSPITAL_COMMUNITY)
Admission: RE | Admit: 2024-02-15 | Discharge: 2024-02-15 | Disposition: A | Discharge status: Home / Self Care | Source: Ambulatory Visit | Attending: Orthopedic Surgery | Admitting: Orthopedic Surgery

## 2024-02-15 DIAGNOSIS — S46811A Strain of other muscles, fascia and tendons at shoulder and upper arm level, right arm, initial encounter: Secondary | ICD-10-CM | POA: Diagnosis not present

## 2024-02-15 DIAGNOSIS — M25411 Effusion, right shoulder: Secondary | ICD-10-CM | POA: Diagnosis not present

## 2024-02-15 DIAGNOSIS — M19011 Primary osteoarthritis, right shoulder: Secondary | ICD-10-CM | POA: Diagnosis not present

## 2024-02-15 DIAGNOSIS — M75121 Complete rotator cuff tear or rupture of right shoulder, not specified as traumatic: Secondary | ICD-10-CM | POA: Diagnosis not present

## 2024-02-15 DIAGNOSIS — M25511 Pain in right shoulder: Secondary | ICD-10-CM | POA: Insufficient documentation

## 2024-02-17 ENCOUNTER — Other Ambulatory Visit: Payer: Self-pay | Admitting: Family Medicine

## 2024-02-24 ENCOUNTER — Ambulatory Visit: Admitting: Orthopedic Surgery

## 2024-02-27 ENCOUNTER — Other Ambulatory Visit: Payer: Self-pay | Admitting: Family Medicine

## 2024-02-27 ENCOUNTER — Other Ambulatory Visit: Payer: Self-pay | Admitting: Gastroenterology

## 2024-02-27 ENCOUNTER — Ambulatory Visit: Admitting: Orthopedic Surgery

## 2024-02-27 DIAGNOSIS — K3184 Gastroparesis: Secondary | ICD-10-CM

## 2024-02-27 NOTE — Progress Notes (Unsigned)
   There were no vitals taken for this visit.  There is no height or weight on file to calculate BMI.  No chief complaint on file.   No diagnosis found.  DOI/DOS/ Date: ongoing pain/ here to review MRI scan Right shoulder   Worse

## 2024-02-28 ENCOUNTER — Ambulatory Visit: Admitting: Orthopedic Surgery

## 2024-02-28 ENCOUNTER — Encounter: Payer: Self-pay | Admitting: Orthopedic Surgery

## 2024-02-28 ENCOUNTER — Encounter: Payer: Self-pay | Admitting: Family Medicine

## 2024-02-28 DIAGNOSIS — S46211A Strain of muscle, fascia and tendon of other parts of biceps, right arm, initial encounter: Secondary | ICD-10-CM

## 2024-02-28 DIAGNOSIS — M7521 Bicipital tendinitis, right shoulder: Secondary | ICD-10-CM

## 2024-02-28 DIAGNOSIS — M19011 Primary osteoarthritis, right shoulder: Secondary | ICD-10-CM | POA: Diagnosis not present

## 2024-02-28 DIAGNOSIS — M19019 Primary osteoarthritis, unspecified shoulder: Secondary | ICD-10-CM

## 2024-02-28 DIAGNOSIS — S46011D Strain of muscle(s) and tendon(s) of the rotator cuff of right shoulder, subsequent encounter: Secondary | ICD-10-CM

## 2024-02-28 MED ORDER — METHYLPREDNISOLONE ACETATE 40 MG/ML IJ SUSP
40.0000 mg | Freq: Once | INTRAMUSCULAR | Status: AC
  Administered 2024-02-28: 40 mg via INTRA_ARTICULAR

## 2024-02-28 NOTE — Progress Notes (Signed)
 Patient ID: Jamie Harding, female   DOB: [birth year], 68 y.o.   MRN: [id number]   Follow-up  Encounter Diagnoses  Name Primary?   Traumatic tear of right rotator cuff, unspecified tear extent, subsequent encounter Yes   Tendinitis of long head of biceps brachii of right shoulder    Rupture of right proximal biceps tendon, initial encounter    Glenohumeral arthritis      Right shoulder pain no improvement worsening  MRI was performed  My interpretation of the MRI is that the patient has rotator cuff tear she has a mild arthritis this cuff tear also involves the superior edge of the subscapularis and the biceps tendon also looks to be torn agraffe there also was some atrophy in the supraspinatus and subscapularis biceps tendon was also torn the acromioclavicular joint shows some arthritis  I discussed these findings with the patient and presented her with the options of surgical intervention including reverse total shoulder versus cuff repair  The patient is on Plavix  She is also diabetic  We discussed how the diabetes can interfere with healing  Our plan is to discuss this with other shoulder surgeons to come up with a plan as to the best possible treatment and I will get back to her next week   Patient will have an injection to help with the pain    Procedure note the subacromial injection shoulder RIGHT    Verbal consent was obtained to inject the  RIGHT   Shoulder  Timeout was completed to confirm the injection site is a subacromial space of the  RIGHT  shoulder   Medication used Depo-Medrol 40 mg and lidocaine 1% 3 cc  Anesthesia was provided by ethyl chloride  The injection was performed in the RIGHT  posterior subacromial space. After pinning the skin with alcohol and anesthetized the skin with ethyl chloride the subacromial space was injected using a 20-gauge needle. There were no complications  Sterile dressing was applied.

## 2024-02-29 ENCOUNTER — Other Ambulatory Visit: Payer: Self-pay | Admitting: Family Medicine

## 2024-02-29 MED ORDER — PREGABALIN 25 MG PO CAPS: 25.0000 mg | ORAL_CAPSULE | Freq: Two times a day (BID) | ORAL | 2 refills | Status: DC

## 2024-02-29 NOTE — Telephone Encounter (Signed)
 Nurses We can try Lyrica Prescription was sent in Stop gabapentin When starting Lyrica 25 mg 1 each evening for the first 5 days then 1 twice daily If drowsiness stop medicine Give us  feedback within 7 to 14 days  Please also do a handicap placard for both Jamie Harding and her husband Jamie Harding  Please send patient MyChart message regarding all of this thank you

## 2024-03-07 ENCOUNTER — Telehealth: Payer: Self-pay | Admitting: Orthopedic Surgery

## 2024-03-07 NOTE — Telephone Encounter (Signed)
 Dr. Delfino Fellers pt - Jamie Harding lvm on 03/06/24 at 2:25pm stating that Dr. Marvina Slough was supposed to call her back Monday to let her know about her surgery, she's been waiting all week and hasn't heard anything.  She would like a call back letting her know if he is still planning on doing her surgery [phone]

## 2024-03-09 ENCOUNTER — Encounter: Payer: Self-pay | Admitting: Orthopedic Surgery

## 2024-03-13 ENCOUNTER — Other Ambulatory Visit: Payer: Self-pay | Admitting: Family Medicine

## 2024-03-20 ENCOUNTER — Ambulatory Visit: Admitting: Orthopedic Surgery

## 2024-03-20 ENCOUNTER — Encounter: Payer: Self-pay | Admitting: Orthopedic Surgery

## 2024-03-20 VITALS — BP 120/80

## 2024-03-20 DIAGNOSIS — S46011D Strain of muscle(s) and tendon(s) of the rotator cuff of right shoulder, subsequent encounter: Secondary | ICD-10-CM | POA: Diagnosis not present

## 2024-03-20 NOTE — Progress Notes (Signed)
 New Patient Visit  Assessment: Jamie Harding is a 68 y.o. female with the following: 1. Traumatic complete tear of right rotator cuff, subsequent encounter  Plan: Maddalyn A Macha continues to have a a lot of pain, with limited function of the right shoulder after a fall several months ago.  Injections have not been effective.  MRI demonstrates multiple tears, with retraction of the supraspinatus, subscapularis, as well as the biceps tendon.  Based on the constellation of symptoms, as well as the MRI findings, I do think she would benefit from reverse shoulder arthroplasty.  This was discussed with the patient, and she is in agreement.  Neck step will include medical clearance.  Will also need to obtain a CT scan prior to scheduling surgery.  Will plan to schedule surgery, with the assistance of Dr. Phyllis Breeze.  Specifically for medical clearance, we will need to assess her A1c.  In addition, she takes Plavix , so we will need to have a firm plan for stopping the Plavix  prior to surgery.  Patient has also been taking hydrocodone  for 15 years.  In this situation, some patients struggle with pain control following surgery.  In addition, we will need to have a good plan for continued use of narcotics.  I advised her that I would be willing to provide narcotics for up to 6 weeks following surgery.  After that, she will need to return to her physician for regular prescriptions.  Follow-up: Return for After medical clearance for OR.  Subjective:  Chief Complaint  Patient presents with   Shoulder Pain    Right shoulder pain- referred by Dr. Phyllis Breeze for a second opinion for RCT and shoulder replacement.     History of Present Illness: Jamie Harding is a 68 y.o. female who presents for evaluation of shoulder pain.  She has been evaluated by my partner, Dr. Phyllis Breeze.  Approximately 6-8 months ago, she fell a couple of times.  She had pain in the shoulder.  She tried injections, with limited  improvement in her symptoms.  She continues to have a lot of pain and lack of motion of the right shoulder.  She has obtained an MRI, which demonstrates multiple tears in the rotator cuff.  No prior injuries to her right shoulder.  She is a diabetic.  She is on Plavix  for history of small strokes.  In addition, she has been taking hydrocodone  for approximately 15 years.   Review of Systems: No fevers or chills No numbness or tingling No chest pain No shortness of breath No bowel or bladder dysfunction No GI distress No headaches   Medical History:  Past Medical History:  Diagnosis Date   Anxiety    Asthma    Depression    Essential hypertension    Fatty liver    Gastritis    Gastroparesis    GERD (gastroesophageal reflux disease)    Hiatal hernia    History of cardiac catheterization    Minimal coronary atherosclerosis February 2016   History of kidney stones    History of stroke 2008   HLD (hyperlipidemia)    Neuropathy    Obesity    Pancreatitis    Rheumatoid arthritis(714.0)    Stroke (HCC) 2008   Stroke [facility]) 01/2020   Tibialis tendinitis    Type 2 diabetes mellitus (HCC)     Past Surgical History:  Procedure Laterality Date   ANKLE SURGERY     remove extra bone-left   BIOPSY  04/25/2021   Procedure:  BIOPSY;  Surgeon: Vinetta Greening, DO;  [facility];  Service: Endoscopy;;   CARPAL TUNNEL RELEASE     right side   CARPAL TUNNEL RELEASE Left 02/06/2013   Procedure: CARPAL TUNNEL RELEASE ;  Surgeon: Darrin Emerald, MD;  Location: [facility];  Service: Orthopedics;  Laterality: Left;  procedure end 1135   CARPAL TUNNEL RELEASE Right 09/14/2015   Procedure: RIGHT CARPAL TUNNEL RELEASE;  Surgeon: Darrin Emerald, MD;  Location: [facility];  Service: Orthopedics;  Laterality: Right;   CARPAL TUNNEL RELEASE Left 09/28/2015   Procedure: LEFT CARPAL TUNNEL RELEASE;  Surgeon: Darrin Emerald, MD;  Location: [facility];  Service: Orthopedics;  Laterality:  Left;  procedure 1   CESAREAN SECTION     CHOLECYSTECTOMY     COLONOSCOPY WITH PROPOFOL   09/16/2012   BMW:UXLK sessile polyps ranging between 3-74mm in size were found in the sigmoid colon and rectum (hyperplastic)/Mild diverticulosis was noted throughout the entire examined colon/Small internal hemorrhoids. Repeat in 10 years.   COLONOSCOPY WITH PROPOFOL  N/A 09/24/2022   Procedure: COLONOSCOPY WITH PROPOFOL ;  Surgeon: Vinetta Greening, DO;  [facility]: [facility];  Service: Endoscopy;  Laterality: N/A;  12:15pm, asa 3   DILATATION & CURETTAGE/HYSTEROSCOPY WITH MYOSURE N/A 08/05/2019   Procedure: DILATATION & CURETTAGE/HYSTEROSCOPY WITH MYOSURE;  Surgeon: Jan Mcgill, MD;  Location: [facility];  Service: Gynecology;  Laterality: N/A;  myosure rep will be here.  Confirmed on 07/30/19 CS   DILATION AND CURETTAGE OF UTERUS     multiple   ESOPHAGOGASTRODUODENOSCOPY  07/29/2009   Mild gastritis, benign path   ESOPHAGOGASTRODUODENOSCOPY (EGD) WITH PROPOFOL   09/16/2012   SLF:Non-erosive gastritis (inflammation) was found; multiple bx/The duodenal mucosa showed no abnormalities in the ampulla and bulb and second portion of the duodenum/NAUSEA/VOMITING MOST LIKELY DUE TO GERD/GASTRITIS   ESOPHAGOGASTRODUODENOSCOPY (EGD) WITH PROPOFOL  N/A 04/25/2021   Surgeon: Vinetta Greening, DO; Gastritis biopsied, normal examined duodenum biopsied.  Pathology revealed focal chronic gastritis, negative for H. Pylori, benign small bowel mucosa.   FINGER SURGERY Left    left little finger-otif of finger   Gastric Emptying  07/27/2009   The amount of activity in the stomach at 120 minutes was 13% which is in the normal range   LASER ABLATION OF THE CERVIX     LEFT AND RIGHT HEART CATHETERIZATION WITH CORONARY ANGIOGRAM N/A 01/03/2015   Procedure: LEFT AND RIGHT HEART CATHETERIZATION WITH CORONARY ANGIOGRAM;  Surgeon: Arlander Bellman, MD;  Location: [facility];  Service: Cardiovascular;  Laterality:  N/A;   LITHOTRIPSY     POLYPECTOMY  09/16/2012   Procedure: POLYPECTOMY;  Surgeon: Alyce Jubilee, MD;  Location: [facility];  Service: Endoscopy;  Laterality: N/A;   POLYPECTOMY  09/24/2022   Procedure: POLYPECTOMY;  Surgeon: Vinetta Greening, DO;  [facility];  Service: Endoscopy;;   SAVORY DILATION  09/16/2012   Procedure: SAVORY DILATION;  Surgeon: Alyce Jubilee, MD;  Location: [facility];  Service: Endoscopy;  Laterality: N/A;  16 fr dilation   TRIGGER FINGER RELEASE Left 02/06/2013   Procedure: RELEASE TRIGGER FINGER LEFT LONG FINGER/A-1 PULLEY;  Surgeon: Darrin Emerald, MD;  Location: [facility];  Service: Orthopedics;  Laterality: Left;  procedure began 1136   TRIGGER FINGER RELEASE Right 09/14/2015   Procedure: RIGHT LONG FINGER TRIGGER RELEASE;  Surgeon: Darrin Emerald, MD;  Location: [facility];  Service: Orthopedics;  Laterality: Right;   TRIGGER FINGER RELEASE Left 09/28/2015  Procedure: LEFT RING TRIGGER FINGER RELEASE;  Surgeon: Darrin Emerald, MD;  Location: [facility];  Service: Orthopedics;  Laterality: Left;  left ring finger   TRIGGER FINGER RELEASE Right 08/07/2023   Procedure: RELEASE TRIGGER FINGER/A-1 PULLEY Right thumb;  Surgeon: Darrin Emerald, MD;  Location: [facility];  Service: Orthopedics;  Laterality: Right;   TUBAL LIGATION      Family History  Problem Relation Age of Onset   Breast cancer Mother    Diabetes Father    Coronary artery disease Other    Arthritis Other    Asthma Other    Diabetes Sister    Colon cancer Neg Hx    Social History   Tobacco Use   Smoking status: Former    Current packs/day: 0.00    Average packs/day: 0.5 packs/day for 3.0 years (1.5 ttl pk-yrs)    Types: Cigarettes    Start date: 09/10/1984    Quit date: 09/11/1987    Years since quitting: 36.5   Smokeless tobacco: Never  Substance Use Topics   Alcohol use: No   Drug use: No    Allergies  Allergen Reactions   Byetta 10 Mcg Pen [Exenatide] Diarrhea and  Nausea And Vomiting      touch of pancreatis   Naproxen Nausea And Vomiting   Pollen Extract    Augmentin  [Amoxicillin -Pot Clavulanate]     diarrhea   Azithromycin Nausea And Vomiting and Rash   Erythromycin Hives        Invokana  [Canagliflozin ]     Urinary issues,yeast infection   Morphine Hives and Rash   Sulfonamide Derivatives Nausea And Vomiting and Rash    Current Meds  Medication Sig   albuterol  (VENTOLIN  HFA) 108 (90 Base) MCG/ACT inhaler INHALE 2 PUFFS INTO THE LUNGS EVERY 4 (FOUR) HOURS AS NEEDED FOR WHEEZING.   Blood Glucose Monitoring Suppl (ONETOUCH VERIO FLEX SYSTEM) w/Device KIT USE AS DIRECTED TEST BLOOD SUGAR 4 TIMES DAILY.   busPIRone  (BUSPAR ) 10 MG tablet Take 1 tablet BID PRN   cephALEXin  (KEFLEX ) 500 MG capsule Take 1 capsule (500 mg total) by mouth 3 (three) times daily.   cetirizine  (ZYRTEC ) 10 MG tablet TAKE ONE TABLET BY MOUTH ONCE DAILY.   clopidogrel  (PLAVIX ) 75 MG tablet Take 1 tablet (75 mg total) by mouth daily with breakfast.   dicyclomine  (BENTYL ) 10 MG capsule Take 1 capsule (10 mg total) by mouth 3 (three) times daily as needed (for abdominal cramping or diarrhea).   fluticasone  (FLONASE ) 50 MCG/ACT nasal spray Place 2 sprays into both nostrils daily.   HYDROcodone -acetaminophen  (NORCO) 10-325 MG tablet 1 taken up to 4 times daily as needed for pain   HYDROcodone -acetaminophen  (NORCO) 10-325 MG tablet One qid prn pain   HYDROcodone -acetaminophen  (NORCO) 10-325 MG tablet One taken 4 times a day as needed for pain caution drowsiness   insulin  detemir (LEVEMIR ) 100 UNIT/ML injection Inject 72 units in the skin in the morning and 72 units into skin qhs- may titrate to 85 units BID   ketoconazole  (NIZORAL ) 2 % cream Apply 1 Application topically 2 (two) times daily.   linaclotide  (LINZESS ) 290 MCG CAPS capsule Take 1 capsule (290 mcg total) by mouth daily before breakfast.   lisinopril  (ZESTRIL ) 2.5 MG tablet Take 1 tablet (2.5 mg total) by mouth daily.    metFORMIN  (GLUCOPHAGE ) 1000 MG tablet TAKE ONE TABLET BY MOUTH TWO TIMES A DAY.   Multiple Vitamin (MULTIVITAMIN WITH MINERALS) TABS Take 1 tablet by mouth daily.   mupirocin   ointment (BACTROBAN ) 2 % Apply BID to rash prn   naloxegol  oxalate (MOVANTIK ) 25 MG TABS tablet Take 1 tablet (25 mg total) by mouth daily.   NOVOLOG  100 UNIT/ML injection Inject 10 units into skin with breakfast and 10 units into skin at supper   ondansetron  (ZOFRAN ) 8 MG tablet TAKE ONE TABLET BY MOUTH EVERY 8 HOURS AS NEEDED FOR NAUSEA/VOMITING.   ONETOUCH VERIO test strip USE AS DIRECTED TO TEST BLOOD GLUCOSE 4 TIMES DAILY.   pantoprazole  (PROTONIX ) 40 MG tablet TAKE (1) TABLET BY MOUTH TWICE A DAY BEFORE A MEAL.   predniSONE  (DELTASONE ) 10 MG tablet Take 1 tablet (10 mg total) by mouth 3 (three) times daily.   pregabalin  (LYRICA ) 25 MG capsule Take 1 capsule (25 mg total) by mouth 2 (two) times daily.   promethazine  (PHENERGAN ) 25 MG tablet TAKE 1 TABLET BY MOUTH EVERY 8 HOURS AS NEEDED FOR NAUSEA.   rosuvastatin  (CRESTOR ) 20 MG tablet Take 1 tablet (20 mg total) by mouth daily.   triamcinolone  cream (KENALOG ) 0.1 % Apply 1 Application topically 2 (two) times daily.   venlafaxine  (EFFEXOR ) 75 MG tablet TAKE (1) TABLET BY MOUTH 3 TIMES DAILY    Objective: BP 120/80 (BP Location: Left Arm)   Physical Exam:  General: Alert and oriented. and No acute distress. Gait: Normal gait.  Right shoulder without deformity.  No redness.  No swelling.  No bruising.  Tenderness palpation of the anterior shoulder.  Positive Jobe's.  Weakness of the subscapularis, as well as with supraspinatus.  Pain with subscapularis tendon testing.  Fingers are warm well-perfused.  Sensation intact in the axillary nerve distribution.  IMAGING: I personally reviewed images previously obtained in clinic  Right shoulder MRI  IMPRESSION: 1. Full-thickness tear within the anterior supraspinatus tendon footprint measuring up to 0.9 cm in AP  dimension and with up to 1.5 cm tendon retraction. 2. High-grade partial and full-thickness tearing of the superior 2/3 of the subscapularis tendon insertion over an approximate 1.8 cm craniocaudal length. 3. Mild-to-moderate supraspinatus and mild superior subscapularis muscle atrophy. 4. The long head of the biceps tendon is not well visualized proximal to the bicipital groove or within the bicipital groove. Findings are suspicious for a proximal tendon rupture with distal tendon retraction. 5. Mild-to-moderate degenerative changes of the acromioclavicular joint.  New Medications:  No orders of the defined types were placed in this encounter.     Tonita Frater, MD  03/20/2024 11:41 AM

## 2024-03-25 ENCOUNTER — Ambulatory Visit: Payer: PPO | Admitting: Family Medicine

## 2024-03-26 ENCOUNTER — Telehealth: Payer: Self-pay | Admitting: Student

## 2024-03-26 ENCOUNTER — Telehealth: Payer: Self-pay | Admitting: Family Medicine

## 2024-03-26 NOTE — Telephone Encounter (Signed)
 Error

## 2024-03-26 NOTE — Telephone Encounter (Signed)
 Patient having surgery on her right shoulder coming up we will do preoperative assessment when she follows up in mid May

## 2024-04-02 DIAGNOSIS — E785 Hyperlipidemia, unspecified: Secondary | ICD-10-CM | POA: Diagnosis not present

## 2024-04-02 DIAGNOSIS — I10 Essential (primary) hypertension: Secondary | ICD-10-CM | POA: Diagnosis not present

## 2024-04-02 DIAGNOSIS — E1169 Type 2 diabetes mellitus with other specified complication: Secondary | ICD-10-CM | POA: Diagnosis not present

## 2024-04-02 DIAGNOSIS — E1159 Type 2 diabetes mellitus with other circulatory complications: Secondary | ICD-10-CM | POA: Diagnosis not present

## 2024-04-03 LAB — HEPATIC FUNCTION PANEL
ALT: 12 IU/L (ref 0–32)
AST: 15 IU/L (ref 0–40)
Albumin: 4.4 g/dL (ref 3.9–4.9)
Alkaline Phosphatase: 56 IU/L (ref 44–121)
Bilirubin Total: 0.3 mg/dL (ref 0.0–1.2)
Bilirubin, Direct: 0.13 mg/dL (ref 0.00–0.40)
Total Protein: 7 g/dL (ref 6.0–8.5)

## 2024-04-03 LAB — MICROALBUMIN / CREATININE URINE RATIO
Creatinine, Urine: 141.1 mg/dL
Microalb/Creat Ratio: 42 mg/g{creat} — ABNORMAL HIGH (ref 0–29)
Microalbumin, Urine: 59.3 ug/mL

## 2024-04-03 LAB — BASIC METABOLIC PANEL WITH GFR
BUN/Creatinine Ratio: 13 (ref 12–28)
BUN: 12 mg/dL (ref 8–27)
CO2: 22 mmol/L (ref 20–29)
Calcium: 9.6 mg/dL (ref 8.7–10.3)
Chloride: 103 mmol/L (ref 96–106)
Creatinine, Ser: 0.93 mg/dL (ref 0.57–1.00)
Glucose: 134 mg/dL — ABNORMAL HIGH (ref 70–99)
Potassium: 4.3 mmol/L (ref 3.5–5.2)
Sodium: 143 mmol/L (ref 134–144)
eGFR: 68 mL/min/{1.73_m2} (ref >59)

## 2024-04-03 LAB — LIPID PANEL
Chol/HDL Ratio: 2.4 ratio (ref 0.0–4.4)
Cholesterol, Total: 153 mg/dL (ref 100–199)
HDL: 64 mg/dL (ref >39)
LDL Chol Calc (NIH): 74 mg/dL (ref 0–99)
Triglycerides: 80 mg/dL (ref 0–149)
VLDL Cholesterol Cal: 15 mg/dL (ref 5–40)

## 2024-04-03 LAB — HEMOGLOBIN A1C
Est. average glucose Bld gHb Est-mCnc: 143 mg/dL
Hgb A1c MFr Bld: 6.6 % — ABNORMAL HIGH (ref 4.8–5.6)

## 2024-04-06 ENCOUNTER — Ambulatory Visit: Payer: Self-pay | Admitting: Family Medicine

## 2024-04-08 ENCOUNTER — Ambulatory Visit (INDEPENDENT_AMBULATORY_CARE_PROVIDER_SITE_OTHER): Admitting: Family Medicine

## 2024-04-08 ENCOUNTER — Encounter: Payer: Self-pay | Admitting: Family Medicine

## 2024-04-08 VITALS — BP 128/72 | HR 96 | Temp 97.3°F | Ht 62.0 in | Wt 174.0 lb

## 2024-04-08 DIAGNOSIS — M25511 Pain in right shoulder: Secondary | ICD-10-CM

## 2024-04-08 DIAGNOSIS — E1159 Type 2 diabetes mellitus with other circulatory complications: Secondary | ICD-10-CM | POA: Diagnosis not present

## 2024-04-08 DIAGNOSIS — E785 Hyperlipidemia, unspecified: Secondary | ICD-10-CM

## 2024-04-08 DIAGNOSIS — Z79891 Long term (current) use of opiate analgesic: Secondary | ICD-10-CM | POA: Diagnosis not present

## 2024-04-08 DIAGNOSIS — E1169 Type 2 diabetes mellitus with other specified complication: Secondary | ICD-10-CM | POA: Diagnosis not present

## 2024-04-08 DIAGNOSIS — I10 Essential (primary) hypertension: Secondary | ICD-10-CM | POA: Diagnosis not present

## 2024-04-08 MED ORDER — HYDROCODONE-ACETAMINOPHEN 10-325 MG PO TABS: ORAL_TABLET | ORAL | 0 refills | Status: DC

## 2024-04-08 MED ORDER — LINACLOTIDE 290 MCG PO CAPS: 290.0000 ug | ORAL_CAPSULE | Freq: Every day | ORAL | 3 refills | Status: DC

## 2024-04-08 NOTE — Progress Notes (Signed)
 Subjective:    Patient ID: Jamie Harding, female    DOB: [birth year], 68 y.o.   MRN: [id number]  HPI  Right shoulder replacement surgery to be done by orthopedic - Dr Phyllis Breeze  Discussed the use of AI scribe software for clinical note transcription with the patient, who gave verbal consent to proceed.  History of Present Illness   Jamie Harding is a 68 year old female who presents for pre-surgical assessment for reverse shoulder arthroplasty.  She experiences severe shoulder pain that limits her range of motion and daily activities. She can only lift her arm to a certain height before it starts hurting and is unable to lift heavy objects or perform tasks like cooking without using her other arm for support. The pain is described as 'terrible' and affects her ability to sleep, as she often wakes up due to the pain and cannot lie on her affected side. These symptoms have persisted for over two years, with a noted decline in her ability to perform tasks such as curling her hair. The pain has progressively worsened, significantly impacting her quality of life.  She has a history of constipation, experiencing bowel movements once or twice a week, often firm and hard, requiring straining. She was previously on Linzess , which helped. She also has a history of gastroparesis, which was identified after an inadequate bowel prep for a colonoscopy last year.  Her current medications include hydrocodone  for pain management, which she tolerates well. She has previously taken oxycodone  and tolerated it without issues. She is also on Lyrica , which she finds more tolerable than gabapentin , as it does not make her feel 'drunk'. She has diabetes, managed with metformin , and her recent A1c was 6.6. She is not on insulin . She also takes Effexor  and clopidogrel  (Plavix ).  No chest pain, able to walk to the hospital without stopping, can climb one flight of stairs without issues.      She relates she is able to  walk long distances without shortness of breath or chest tightness she is able to go up a flight of steps without trouble she does have risk factors for heart disease but no obvious outward symptoms of heart disease she exceeds 4 METS no need for further cardiac workup.  She is able to do housework without difficulty  This patient was seen today for chronic pain  The medication list was reviewed and updated.   Location of Pain for which the patient has been treated with regarding narcotics: Back pain, shoulder pain, neuropathy discomfort  Onset of this pain: Present for years   -Compliance with medication: Good compliance  - Number patient states they take daily: Currently takes 4 tablets of hydrocodone  per day denies drowsiness  -Reason for ongoing use of opioids unable to get relief with NSAIDs Lyrica  Tylenol   What other measures have been tried outside of opioids see above  In the ongoing specialists regarding this condition none currently  -when was the last dose patient took?  Earlier today  The patient was advised the importance of maintaining medication and not using illegal substances with these.  Here for refills and follow up  The patient was educated that we can provide 3 monthly scripts for their medication, it is their responsibility to follow the instructions.  Side effects or complications from medications: Denies side effects  Patient is aware that pain medications are meant to minimize the severity of the pain to allow their pain levels to improve to allow for better  function. They are aware of that pain medications cannot totally remove their pain.  Due for UDT ( at least once per year) (pain management contract is also completed at the time of the UDT): December 2024  Scale of 1 to 10 ( 1 is least 10 is most) Your pain level without the medicine: 8 Your pain level with medication 5  Scale 1 to 10 ( 1-helps very little, 10 helps very well) How well does your  pain medication reduce your pain so you can function better through out the day?  7  Quality of the pain: Throbbing aching  Persistence of the pain: Worse with activity present all time  Modifying factors: See above  Results for orders placed or performed in visit on 11/11/23  Hemoglobin A1c   Collection Time: 04/02/24 10:27 AM  Result Value Ref Range   Hgb A1c MFr Bld 6.6 (H) 4.8 - 5.6 %   Est. average glucose Bld gHb Est-mCnc 143 mg/dL  Basic metabolic panel   Collection Time: 04/02/24 10:27 AM  Result Value Ref Range   Glucose 134 (H) 70 - 99 mg/dL   BUN 12 8 - 27 mg/dL   Creatinine, Ser 8.41 0.57 - 1.00 mg/dL   eGFR 68 >32 GM/WNU/2.72   BUN/Creatinine Ratio 13 12 - 28   Sodium 143 134 - 144 mmol/L   Potassium 4.3 3.5 - 5.2 mmol/L   Chloride 103 96 - 106 mmol/L   CO2 22 20 - 29 mmol/L   Calcium  9.6 8.7 - 10.3 mg/dL  Lipid panel   Collection Time: 04/02/24 10:27 AM  Result Value Ref Range   Cholesterol, Total 153 100 - 199 mg/dL   Triglycerides 80 0 - 149 mg/dL   HDL 64 >53 mg/dL   VLDL Cholesterol Cal 15 5 - 40 mg/dL   LDL Chol Calc (NIH) 74 0 - 99 mg/dL   Chol/HDL Ratio 2.4 0.0 - 4.4 ratio  Hepatic function panel   Collection Time: 04/02/24 10:27 AM  Result Value Ref Range   Total Protein 7.0 6.0 - 8.5 g/dL   Albumin 4.4 3.9 - 4.9 g/dL   Bilirubin Total 0.3 0.0 - 1.2 mg/dL   Bilirubin, Direct 6.64 0.00 - 0.40 mg/dL   Alkaline Phosphatase 56 44 - 121 IU/L   AST 15 0 - 40 IU/L   ALT 12 0 - 32 IU/L  Microalbumin / creatinine urine ratio   Collection Time: 04/02/24 10:27 AM  Result Value Ref Range   Creatinine, Urine 141.1 Not Estab. mg/dL   Microalbumin, Urine 40.3 Not Estab. ug/mL   Microalb/Creat Ratio 42 (H) 0 - 29 mg/g creat    Labs reviewed protein in urine minimal A1c under good control cholesterol looks good    Review of Systems     Objective:   Physical Exam General-in no acute distress Eyes-no discharge Lungs-respiratory rate normal,  CTA CV-no murmurs,RRR Extremities skin warm dry no edema Neuro grossly normal Behavior normal, alert Extremities no edema diabetic foot exam normal       Assessment & Plan:   Assessment and Plan    Shoulder pain and limited mobility Chronic shoulder pain with significant mobility limitation. Severe pain requires reverse shoulder arthroplasty due to rotator cuff issues and tendon displacement. Discussed surgical risks and benefits, including improved mobility and pain relief versus small risk of complications. Informed consent obtained. - Proceed with reverse shoulder arthroplasty. - Encourage early mobility post-surgery to reduce risk of blood clots. - Coordinate with surgical team  regarding anticoagulant therapy management.  Use of anticoagulant therapy Currently on clopidogrel . Discussed need to stop 6-7 days prior to surgery to reduce bleeding risk. Acknowledged small stroke risk during this period, deemed acceptable given surgical benefits. - Discontinue clopidogrel  6-7 days before surgery.  Use of opioid analgesic Current hydrocodone  use for pain. Discussed potential need for stronger analgesics post-surgery, possibly oxycodone . Emphasized not combining opioids and monitoring for sedation. - Consider transitioning to oxycodone  post-surgery if needed for pain management. - Monitor for side effects of oxycodone , especially excessive sedation. - Coordinate with healthcare provider regarding pain management post-surgery.  Constipation Chronic constipation managed with Linzess . Discussed importance of managing constipation post-surgery. - Prescribe Linzess  if approved. - Increase dietary fiber intake or use stool softeners post-surgery to prevent constipation.  Type 2 diabetes mellitus without complications Type 2 diabetes well-controlled with A1c of 6.6. Emphasized maintaining blood sugar control post-surgery to aid healing. - Continue monitoring blood sugar levels  post-surgery. - Maintain healthy diet post-surgery to prevent hyperglycemia. - Report any significant changes in blood sugar levels post-surgery.     1. Right shoulder pain, unspecified chronicity (Primary) I believe that she poses a low surgical risk.  Does not need additional cardiac testing.  See discussion above.  Patient was encouraged to maintain good mobility after surgery to minimize the risk of blood clots  2. Type 2 diabetes mellitus with vascular disease (HCC) A1c under good control continue current measures  3. Essential hypertension Blood pressure good control continue current measures  4. Hyperlipidemia associated with type 2 diabetes mellitus (HCC) Cholesterol good control continue current measures  5. Long-term current use of opiate analgesic The patient was seen in followup for chronic pain. A review over at their current pain status was discussed. Drug registry was checked. Prescriptions were given.  Regular follow-up recommended. Discussion was held regarding the importance of compliance with medication as well as pain medication contract.  Patient was informed that medication may cause drowsiness and should not be combined  with other medications/alcohol or street drugs. If the patient feels medication is causing altered alertness then do not drive or operate dangerous equipment.  Should be noted that the patient appears to be meeting appropriate use of opioids and response.  Evidenced by improved function and decent pain control without significant side effects and no evidence of overt aberrancy issues.  Upon discussion with the patient today they understand that opioid therapy is optional and they feel that the pain has been refractory to reasonable conservative measures and is significant and affecting quality of life enough to warrant ongoing therapy and wishes to continue opioids.  Refills were provided.  [place]  medical Board guidelines regarding the pain  medicine has been reviewed.  CDC guidelines most updated 2022 has been reviewed by the prescriber.  PDMP is checked on a regular basis yearly urine drug screen and pain management contract  Treatment plan for this patient includes #1-gentle stretching exercises as shown daily basis 2.  Mild strength exercises 3 times per week #3 continue pain medications #4 notify us  if any digression  Patient has to follow-up visit late June we will see her at that time  Patient is on pain medicine.  It is possible she may need something stronger after surgery perhaps oxycodone .  It would be fine for orthopedics to prescribe this if they need our help to ask  It is also recommended to stop Plavix  6 to 7 days before surgery and may resume the following day after surgery  if no bleeding issues patient is aware there is a slight theoretical risk of having a stroke off of Plavix  but otherwise may stop Plavix  as discussed  We will send a copy of this office note to surgeons regarding this issue

## 2024-04-10 ENCOUNTER — Other Ambulatory Visit: Payer: Self-pay

## 2024-04-10 DIAGNOSIS — S46011D Strain of muscle(s) and tendon(s) of the rotator cuff of right shoulder, subsequent encounter: Secondary | ICD-10-CM

## 2024-04-10 DIAGNOSIS — M19019 Primary osteoarthritis, unspecified shoulder: Secondary | ICD-10-CM

## 2024-04-14 ENCOUNTER — Telehealth: Payer: Self-pay | Admitting: Orthopedic Surgery

## 2024-04-14 NOTE — Telephone Encounter (Signed)
 Dr. Delfino Fellers pt - pt lvm wanting to know when he can do surgery on her shoulder.  She stated that she's been hurting really bad.  She stated she has all her blood work done.  She stated all she needs for us  to schedule now is her EKG and CT scan.  [phone]

## 2024-04-15 ENCOUNTER — Telehealth: Payer: Self-pay | Admitting: Cardiology

## 2024-04-15 NOTE — Telephone Encounter (Signed)
 Called and left VM with # to Central Scheduling and office call back for any other questions.

## 2024-04-15 NOTE — Telephone Encounter (Signed)
   Patient Name: Jamie Harding  DOB: [birth year] MRN: [id number]  Primary Cardiologist: Janelle Mediate, MD  Chart reviewed as part of pre-operative protocol coverage. Patient is pending right reverse shoulder arthroplasty. Patient has not been seen by [facility] since 05/05/2020. However, her record was reviewed by Dr. Stann Earnest. Per Dr. Stann Earnest: "Patient has normal EF no obstructive CAD and no significant arrhythmias Biggest risk for surgery is prior CVA and poorly controlled DM. Clear to have surgery with general anesthesia from cardiac perspective."  If she develops new symptoms prior to surgery, she should contact our office to arrange for a follow-up visit, and she verbalized understanding.  Patient takes Plavix  with hx CVA, cardiology does not manage this. Please contact prescribing provider regarding perioperative management of this medication.   I will route this recommendation to the requesting party via Epic fax function and remove from pre-op pool.  Please call with questions.  Leala Prince, PA-C 04/15/2024, 10:02 AM

## 2024-04-15 NOTE — Telephone Encounter (Signed)
-----   Message from Janelle Mediate sent at 03/26/2024  9:49 AM EDT ----- Patient has normal EF no obstructive CAD and no significant arrhythmias Biggest risk for surgery is prior CVA and poorly controlled DM. Clear to have surgery with general anesthesia from cardiac perspective ----- Message ----- From: Marti Slates, LPN Sent: 11/24/1094   9:42 AM EDT To: Loyde Rule, MD; Bennet Brasil, MD

## 2024-04-19 ENCOUNTER — Ambulatory Visit (HOSPITAL_COMMUNITY)
Admission: RE | Admit: 2024-04-19 | Discharge: 2024-04-19 | Disposition: A | Discharge status: Home / Self Care | Source: Ambulatory Visit | Attending: Orthopedic Surgery | Admitting: Orthopedic Surgery

## 2024-04-19 DIAGNOSIS — S46011D Strain of muscle(s) and tendon(s) of the rotator cuff of right shoulder, subsequent encounter: Secondary | ICD-10-CM | POA: Diagnosis not present

## 2024-04-19 DIAGNOSIS — M7581 Other shoulder lesions, right shoulder: Secondary | ICD-10-CM | POA: Diagnosis not present

## 2024-04-19 DIAGNOSIS — Z01818 Encounter for other preprocedural examination: Secondary | ICD-10-CM | POA: Diagnosis not present

## 2024-04-19 DIAGNOSIS — M19011 Primary osteoarthritis, right shoulder: Secondary | ICD-10-CM | POA: Diagnosis not present

## 2024-04-19 DIAGNOSIS — M19019 Primary osteoarthritis, unspecified shoulder: Secondary | ICD-10-CM | POA: Insufficient documentation

## 2024-04-23 ENCOUNTER — Telehealth: Payer: Self-pay | Admitting: Radiology

## 2024-04-23 ENCOUNTER — Ambulatory Visit: Payer: Self-pay | Admitting: Orthopedic Surgery

## 2024-04-23 DIAGNOSIS — S46011D Strain of muscle(s) and tendon(s) of the rotator cuff of right shoulder, subsequent encounter: Secondary | ICD-10-CM

## 2024-04-23 DIAGNOSIS — Z01818 Encounter for other preprocedural examination: Secondary | ICD-10-CM

## 2024-04-23 NOTE — Telephone Encounter (Signed)
 Patient called back said she would take the 19th for her surgery.

## 2024-04-27 ENCOUNTER — Encounter: Payer: Self-pay | Admitting: Family Medicine

## 2024-04-29 ENCOUNTER — Other Ambulatory Visit: Payer: Self-pay

## 2024-04-29 DIAGNOSIS — M19019 Primary osteoarthritis, unspecified shoulder: Secondary | ICD-10-CM

## 2024-04-29 DIAGNOSIS — S46011D Strain of muscle(s) and tendon(s) of the rotator cuff of right shoulder, subsequent encounter: Secondary | ICD-10-CM

## 2024-04-29 NOTE — Patient Instructions (Signed)
 ZOEI AMISON  04/29/2024     @PREFPERIOPPHARMACY @   Your procedure is scheduled on  05/07/2024.   Report to [facility] at  0600 A.M.   Call this number if you have problems the morning of surgery:  [phone]  If you experience any cold or flu symptoms such as cough, fever, chills, shortness of breath, etc. between now and your scheduled surgery, please notify us  at the above number.   Remember:       Your last dose of plavix  should be on 04/30/2024.       Use your inhaler before you come and bring your rescue inhaler with you.        Take 1/2 of your usual night time insulin  the night before your procedure.       DO NOT take any medications for diabetes the morning of your procedure.   Do not eat after midnight.    You may drink clear liquids until 0330 am on 05/07/2024.    Clear liquids allowed are:                    Water , Juice (No red color; non-citric and without pulp; diabetics please choose diet or no sugar options), Carbonated beverages (diabetics please choose diet or no sugar options), Clear Tea (No creamer, milk, or cream, including half & half and powdered creamer), Black Coffee Only (No creamer, milk or cream, including half & half and powdered creamer), and Clear Sports drink (No red color; diabetics please choose diet or no sugar options)           At 0330 am on 05/07/2024 drink 1- 12 oz Gzero or 12 oz of water . You can have nothing else to drink after this.    Take these medicines the morning of surgery with A SIP OF WATER          buspirone , hydrocodone (if needed), pantoprazole , pregabalin , venlafaxine , zofran  (if needed).    Do not wear jewelry, make-up or nail polish, including gel polish,  artificial nails, or any other type of covering on natural nails (fingers and  toes).  Do not wear lotions, powders, or perfumes, or deodorant.  Do not shave 48 hours prior to surgery.  Men may shave face and neck.  Do not bring valuables to the  hospital.  [facility] is not responsible for any belongings or valuables.  Contacts, dentures or bridgework may not be worn into surgery.  Leave your suitcase in the car.  After surgery it may be brought to your room.  For patients admitted to the hospital, discharge time will be determined by your treatment team.  Patients discharged the day of surgery will not be allowed to drive home and must have someone with them for 24 hours.    Special instructions:   DO NOT smoke tobacco or vape for 24 hours before your procedure.  Please read over the following fact sheets that you were given. Coughing and Deep Breathing, Total Joint Packet, Surgical Site Infection Prevention, Anesthesia Post-op Instructions, and Care and Recovery After Surgery      Reverse Total Shoulder Replacement Surgery: What to Know After After a reverse total shoulder replacement surgery, it's common to have pain and stiffness in your shoulder and arm. Follow these instructions at home: Medicines Take your medicines only as told. If you were given antibiotics, take them as told. Do not stop taking them even if you start  to feel better. You may need to take steps to help treat or prevent trouble pooping (constipation), such as: Taking medicines to help you poop. Eating foods high in fiber, like beans, whole grains, and fresh fruits and vegetables. Drinking more fluids as told. Ask your health care provider if it's safe to drive or use machines while taking your medicine. If you have a sling or immobilizer:  Wear the sling or immobilizer as told. Take it off only if your provider says you can. Check the skin around it every day. Tell your provider if you see problems. Loosen the sling if your fingers tingle, are numb, or turn cold and blue. Keep the sling or immobilizer clean and dry. Bathing Do not take baths, swim, or use a hot tub until you're told it's OK. Ask if you can shower. If your sling or immobilizer  isn't waterproof: Do not let it get wet. Cover it when you take a bath or a shower. Use a cover that doesn't let any water  in. Keep your bandage dry until your provider says you can take it off. Caring for your cut from surgery  Take care of your cut from surgery as told. Make sure you: Wash your hands with soap and water  for at least 20 seconds before and after you change your bandage. If you can't use soap and water , use hand sanitizer. Change your bandage. Leave stitches or skin glue alone. Leave tape strips alone unless you're told to take them off. You may trim the edges of the tape strips if they curl up. If you have a tube called a drain to remove extra fluid, care for it as told. Check the area around your cut every day for signs of infection. Check for: More redness, swelling, or pain. Fluid or blood. Warmth. Pus or a bad smell. Managing pain, stiffness, and swelling  Use ice or an ice pack as told. If you have a sling or immobilizer that you can take off, remove it only as told. Place a towel between your skin and the ice. Leave the ice on for 20 minutes, 2-3 times a day. If your skin turns red, take off the ice right away to prevent skin damage. The risk of damage is higher if you can't feel pain, heat, or cold. Move your fingers and hand often to reduce stiffness and swelling. Driving If you were given a sedative, do not drive or use machines until you're told it's safe. A sedative can make you sleepy. Ask when it's safe to drive if you have a sling or immobilizer on your arm. Activity Rest as told. Get up to take short walks many times during the day. This helps you breathe better and keeps your blood flowing. Ask for help if you feel weak or unsteady. Do not use your arm to push yourself up in bed or from a chair. Ask if it's OK for you to lift. Do not lift anything heavier than a cup of coffee for the first 6 weeks with the arm you had surgery on, or as told by your  provider. Exercise as told. Try not to overuse your shoulder. Early overuse of the shoulder may result in later problems. Do not play contact sports. Ask what things are safe for you to do at home. Ask when you can go back to work or school. General instructions Do not smoke, vape, or use nicotine or tobacco. Do not have dental work or cleanings for at least 3 months.  Ask your provider if you need to take antibiotics before you have dental work or have your teeth cleaned. Tell your dentist about your new joint. Wear compression stockings to reduce swelling and prevent blood clots in your legs. Keep all follow-up visits. Your provider will make sure you're healing. Contact a health care provider if: You have a fever. Your arm tingles or feels numb. Your pain gets worse, even after you take pain medicine. You have any signs of infection. Get help right away if: You have redness, swelling, pain, or warmth in your leg or arm. You have chest pain or shortness of breath. Your shoulder joint moves out of place. The edges of your cut break open after the stitches have been taken out. These symptoms may be an emergency. Call 911 right away. Do not wait to see if the symptoms will go away. Do not drive yourself to the hospital. This information is not intended to replace advice given to you by your health care provider. Make sure you discuss any questions you have with your health care provider. Document Revised: 10/21/2023 Document Reviewed: 10/21/2023 Elsevier Patient Education  2025 [employer].General Anesthesia, Adult, Care After The following information offers guidance on how to care for yourself after your procedure. Your health care provider may also give you more specific instructions. If you have problems or questions, contact your health care provider. What can I expect after the procedure? After the procedure, it is common for people to: Have pain or discomfort at the IV  site. Have nausea or vomiting. Have a sore throat or hoarseness. Have trouble concentrating. Feel cold or chills. Feel weak, sleepy, or tired (fatigue). Have soreness and body aches. These can affect parts of the body that were not involved in surgery. Follow these instructions at home: For the time period you were told by your health care provider:  Rest. Do not participate in activities where you could fall or become injured. Do not drive or use machinery. Do not drink alcohol. Do not take sleeping pills or medicines that cause drowsiness. Do not make important decisions or sign legal documents. Do not take care of children on your own. General instructions Drink enough fluid to keep your urine pale yellow. If you have sleep apnea, surgery and certain medicines can increase your risk for breathing problems. Follow instructions from your health care provider about wearing your sleep device: Anytime you are sleeping, including during daytime naps. While taking prescription pain medicines, sleeping medicines, or medicines that make you drowsy. Return to your normal activities as told by your health care provider. Ask your health care provider what activities are safe for you. Take over-the-counter and prescription medicines only as told by your health care provider. Do not use any products that contain nicotine or tobacco. These products include cigarettes, chewing tobacco, and vaping devices, such as e-cigarettes. These can delay incision healing after surgery. If you need help quitting, ask your health care provider. Contact a health care provider if: You have nausea or vomiting that does not get better with medicine. You vomit every time you eat or drink. You have pain that does not get better with medicine. You cannot urinate or have bloody urine. You develop a skin rash. You have a fever. Get help right away if: You have trouble breathing. You have chest pain. You vomit  blood. These symptoms may be an emergency. Get help right away. Call 911. Do not wait to see if the symptoms will go away.  Do not drive yourself to the hospital. Summary After the procedure, it is common to have a sore throat, hoarseness, nausea, vomiting, or to feel weak, sleepy, or fatigue. For the time period you were told by your health care provider, do not drive or use machinery. Get help right away if you have difficulty breathing, have chest pain, or vomit blood. These symptoms may be an emergency. This information is not intended to replace advice given to you by your health care provider. Make sure you discuss any questions you have with your health care provider. Document Revised: 02/02/2022 Document Reviewed: 02/02/2022 Elsevier Patient Education  2024 Else[employer] Inc.How to Use Chlorhexidine  at Home in the Shower Chlorhexidine  gluconate (CHG) is a germ-killing (antiseptic) wash that's used to clean the skin. It can get rid of the germs that normally live on the skin and can keep them away for about 24 hours. If you're having surgery, you may be told to shower with CHG at home the night before surgery. This can help lower your risk for infection. To use CHG wash in the shower, follow the steps below. Supplies needed: CHG body wash. Clean washcloth. Clean towel. How to use CHG in the shower Follow these steps unless you're told to use CHG in a different way: Start the shower. Use your normal soap and shampoo to wash your face and hair. Turn off the shower or move out of the shower stream. Pour CHG onto a clean washcloth. Do not use any type of brush or rough sponge. Start at your neck, washing your body down to your toes. Make sure you: Wash the part of your body where the surgery will be done for at least 1 minute. Do not scrub. Do not use CHG on your head or face unless your health care provider tells you to. If it gets into your ears or eyes, rinse them well with water . Do not  wash your genitals with CHG. Wash your back and under your arms. Make sure to wash skin folds. Let the CHG sit on your skin for 1-2 minutes or as long as told. Rinse your entire body in the shower, including all body creases and folds. Turn off the shower. Dry off with a clean towel. Do not put anything on your skin afterward, such as powder, lotion, or perfume. Put on clean clothes or pajamas. If it's the night before surgery, sleep in clean sheets. General tips Use CHG only as told, and follow the instructions on the label. Use the full amount of CHG as told. This is often one bottle. Do not smoke and stay away from flames after using CHG. Your skin may feel sticky after using CHG. This is normal. The sticky feeling will go away as the CHG dries. Do not use CHG: If you have a chlorhexidine  allergy or have reacted to chlorhexidine  in the past. On open wounds or areas of skin that have broken skin, cuts, or scrapes. On babies younger than 67 months of age. Contact a health care provider if: You have questions about using CHG. Your skin gets irritated or itchy. You have a rash after using CHG. You swallow any CHG. Call your local poison control center [phone] in the [place] Your eyes itch badly, or they become very red or swollen. Your hearing changes. You have trouble seeing. If you can't reach your provider, go to an urgent care or emergency room. Do not drive yourself. Get help right away if: You have swelling or tingling in  your mouth or throat. You make high-pitched whistling sounds when you breathe, most often when you breathe out (wheeze). You have trouble breathing. These symptoms may be an emergency. Call 911 right away. Do not wait to see if the symptoms will go away. Do not drive yourself to the hospital. This information is not intended to replace advice given to you by your health care provider. Make sure you discuss any questions you have with your health care  provider. Document Revised: 05/21/2023 Document Reviewed: 05/17/2022 Elsevier Patient Education  2024 Else[employer] Inc.How to Use an Incentive Spirometer An incentive spirometer is a tool that measures how well you are filling your lungs with each breath. Learning to take long, deep breaths using this tool can help you keep your lungs clear and active. This may help to reverse or lessen your chance of developing breathing (pulmonary) problems, especially infection. You may be asked to use a spirometer: After a surgery. If you have a lung problem or a history of smoking. After a long period of time when you have been unable to move or be active. If the spirometer includes an indicator to show the highest number that you have reached, your health care provider or respiratory therapist will help you set a goal. Keep a log of your progress as told by your health care provider. What are the risks? Breathing too quickly may cause dizziness or cause you to pass out. Take your time so you do not get dizzy or light-headed. If you are in pain, you may need to take pain medicine before doing incentive spirometry. It is harder to take a deep breath if you are having pain. How to use your incentive spirometer  Sit up on the edge of your bed or on a chair. Hold the incentive spirometer so that it is in an upright position. Before you use the spirometer, breathe out normally. Place the mouthpiece in your mouth. Make sure your lips are closed tightly around it. Breathe in slowly and as deeply as you can through your mouth, causing the piston or the ball to rise toward the top of the chamber. Hold your breath for 3-5 seconds, or for as long as possible. If the spirometer includes a coach indicator, use this to guide you in breathing. Slow down your breathing if the indicator goes above the marked areas. Remove the mouthpiece from your mouth and breathe out normally. The piston or ball will return to the bottom of  the chamber. Rest for a few seconds, then repeat the steps 10 or more times. Take your time and take a few normal breaths between deep breaths so that you do not get dizzy or light-headed. Do this every 1-2 hours when you are awake. If the spirometer includes a goal marker to show the highest number you have reached (best effort), use this as a goal to work toward during each repetition. After each set of 10 deep breaths, cough a few times. This will help to make sure that your lungs are clear. If you have an incision on your chest or abdomen from surgery, place a pillow or a rolled-up towel firmly against the incision when you cough. This can help to reduce pain while taking deep breaths and coughing. General tips When you are able to get out of bed: Walk around often. Continue to take deep breaths and cough in order to clear your lungs. Keep using the incentive spirometer until your health care provider says it is okay to  stop using it. If you have been in the hospital, you may be told to keep using the spirometer at home. Contact a health care provider if: You are having difficulty using the spirometer. You have trouble using the spirometer as often as instructed. Your pain medicine is not giving enough relief for you to use the spirometer as told. You have a fever. Get help right away if: You develop shortness of breath. You develop a cough with bloody mucus from the lungs. You have fluid or blood coming from an incision site after you cough. Summary An incentive spirometer is a tool that can help you learn to take long, deep breaths to keep your lungs clear and active. You may be asked to use a spirometer after a surgery, if you have a lung problem or a history of smoking, or if you have been inactive for a long period of time. Use your incentive spirometer as instructed every 1-2 hours while you are awake. If you have an incision on your chest or abdomen, place a pillow or a rolled-up  towel firmly against your incision when you cough. This will help to reduce pain. Get help right away if you have shortness of breath, you cough up bloody mucus, or blood comes from your incision when you cough. This information is not intended to replace advice given to you by your health care provider. Make sure you discuss any questions you have with your health care provider. Document Revised: 09/13/2023 Document Reviewed: 09/13/2023 Elsevier Patient Education  2024 [employer].

## 2024-04-30 ENCOUNTER — Encounter (HOSPITAL_COMMUNITY)
Admission: RE | Admit: 2024-04-30 | Discharge: 2024-04-30 | Disposition: A | Discharge status: Home / Self Care | Source: Ambulatory Visit | Attending: Orthopedic Surgery | Admitting: Orthopedic Surgery

## 2024-04-30 VITALS — BP 154/77 | HR 85 | Resp 18 | Ht 62.0 in | Wt 173.9 lb

## 2024-04-30 DIAGNOSIS — Z01818 Encounter for other preprocedural examination: Secondary | ICD-10-CM | POA: Insufficient documentation

## 2024-04-30 DIAGNOSIS — I10 Essential (primary) hypertension: Secondary | ICD-10-CM | POA: Insufficient documentation

## 2024-04-30 LAB — CBC
HCT: 40.9 % (ref 36.0–46.0)
Hemoglobin: 13.5 g/dL (ref 12.0–15.0)
MCH: 30 pg (ref 26.0–34.0)
MCHC: 33 g/dL (ref 30.0–36.0)
MCV: 90.9 fL (ref 80.0–100.0)
Platelets: 307 10*3/uL (ref 150–400)
RBC: 4.5 MIL/uL (ref 3.87–5.11)
RDW: 12.8 % (ref 11.5–15.5)
WBC: 7.8 10*3/uL (ref 4.0–10.5)
nRBC: 0 % (ref 0.0–0.2)

## 2024-04-30 LAB — BASIC METABOLIC PANEL WITH GFR
Anion gap: 11 (ref 5–15)
BUN: 17 mg/dL (ref 8–23)
CO2: 23 mmol/L (ref 22–32)
Calcium: 9.5 mg/dL (ref 8.9–10.3)
Chloride: 105 mmol/L (ref 98–111)
Creatinine, Ser: 0.82 mg/dL (ref 0.44–1.00)
GFR, Estimated: >60 mL/min (ref >60)
Glucose, Bld: 156 mg/dL — ABNORMAL HIGH (ref 70–99)
Potassium: 4.4 mmol/L (ref 3.5–5.1)
Sodium: 139 mmol/L (ref 135–145)

## 2024-04-30 LAB — SURGICAL PCR SCREEN
MRSA, PCR: NEGATIVE
Staphylococcus aureus: NEGATIVE

## 2024-05-07 ENCOUNTER — Encounter (HOSPITAL_COMMUNITY): Payer: Self-pay | Admitting: Orthopedic Surgery

## 2024-05-07 ENCOUNTER — Ambulatory Visit (HOSPITAL_COMMUNITY): Payer: Self-pay | Admitting: Anesthesiology

## 2024-05-07 ENCOUNTER — Observation Stay (HOSPITAL_COMMUNITY)
Admission: RE | Admit: 2024-05-07 | Discharge: 2024-05-08 | Disposition: A | Discharge status: Home / Self Care | Source: Ambulatory Visit | Attending: Orthopedic Surgery | Admitting: Orthopedic Surgery

## 2024-05-07 ENCOUNTER — Other Ambulatory Visit: Payer: Self-pay

## 2024-05-07 ENCOUNTER — Ambulatory Visit (HOSPITAL_COMMUNITY)

## 2024-05-07 ENCOUNTER — Encounter (HOSPITAL_COMMUNITY)
Admission: RE | Disposition: A | Discharge status: Home / Self Care | Payer: Self-pay | Source: Ambulatory Visit | Attending: Orthopedic Surgery

## 2024-05-07 DIAGNOSIS — I10 Essential (primary) hypertension: Secondary | ICD-10-CM | POA: Insufficient documentation

## 2024-05-07 DIAGNOSIS — J45909 Unspecified asthma, uncomplicated: Secondary | ICD-10-CM | POA: Diagnosis not present

## 2024-05-07 DIAGNOSIS — Z471 Aftercare following joint replacement surgery: Secondary | ICD-10-CM | POA: Diagnosis not present

## 2024-05-07 DIAGNOSIS — E66813 Obesity, class 3: Secondary | ICD-10-CM | POA: Diagnosis not present

## 2024-05-07 DIAGNOSIS — Z87891 Personal history of nicotine dependence: Secondary | ICD-10-CM | POA: Diagnosis not present

## 2024-05-07 DIAGNOSIS — Z96611 Presence of right artificial shoulder joint: Secondary | ICD-10-CM | POA: Diagnosis not present

## 2024-05-07 DIAGNOSIS — E119 Type 2 diabetes mellitus without complications: Secondary | ICD-10-CM | POA: Insufficient documentation

## 2024-05-07 DIAGNOSIS — M75101 Unspecified rotator cuff tear or rupture of right shoulder, not specified as traumatic: Secondary | ICD-10-CM | POA: Diagnosis not present

## 2024-05-07 DIAGNOSIS — Z79899 Other long term (current) drug therapy: Secondary | ICD-10-CM | POA: Diagnosis not present

## 2024-05-07 DIAGNOSIS — Z8673 Personal history of transient ischemic attack (TIA), and cerebral infarction without residual deficits: Secondary | ICD-10-CM | POA: Diagnosis not present

## 2024-05-07 DIAGNOSIS — M75121 Complete rotator cuff tear or rupture of right shoulder, not specified as traumatic: Secondary | ICD-10-CM | POA: Diagnosis not present

## 2024-05-07 DIAGNOSIS — Z7984 Long term (current) use of oral hypoglycemic drugs: Secondary | ICD-10-CM | POA: Diagnosis not present

## 2024-05-07 HISTORY — PX: REVERSE SHOULDER ARTHROPLASTY: SHX5054

## 2024-05-07 LAB — GLUCOSE, CAPILLARY
Glucose-Capillary: 151 mg/dL — ABNORMAL HIGH (ref 70–99)
Glucose-Capillary: 213 mg/dL — ABNORMAL HIGH (ref 70–99)
Glucose-Capillary: 279 mg/dL — ABNORMAL HIGH (ref 70–99)
Glucose-Capillary: 165 mg/dL — ABNORMAL HIGH (ref 70–99)

## 2024-05-07 SURGERY — ARTHROPLASTY, SHOULDER, TOTAL, REVERSE
Anesthesia: General | Site: Shoulder | Laterality: Right

## 2024-05-07 MED ORDER — DIPHENHYDRAMINE HCL 50 MG/ML IJ SOLN
INTRAMUSCULAR | Status: DC | PRN
  Administered 2024-05-07: 12.5 mg via INTRAVENOUS

## 2024-05-07 MED ORDER — LACTATED RINGERS IV SOLN: INTRAVENOUS | Status: DC

## 2024-05-07 MED ORDER — PANTOPRAZOLE SODIUM 40 MG PO TBEC
40.0000 mg | DELAYED_RELEASE_TABLET | Freq: Two times a day (BID) | ORAL | Status: DC
  Administered 2024-05-07 – 2024-05-08 (×2): 40 mg via ORAL
  Filled 2024-05-07 (×2): qty 1

## 2024-05-07 MED ORDER — DIPHENHYDRAMINE HCL 12.5 MG/5ML PO ELIX: 12.5000 mg | ORAL_SOLUTION | ORAL | Status: DC | PRN

## 2024-05-07 MED ORDER — LINACLOTIDE 145 MCG PO CAPS
290.0000 ug | ORAL_CAPSULE | Freq: Every day | ORAL | Status: DC
  Administered 2024-05-08: 290 ug via ORAL
  Filled 2024-05-07: qty 2

## 2024-05-07 MED ORDER — STERILE WATER FOR IRRIGATION IR SOLN
Status: DC | PRN
  Administered 2024-05-07: 1000 mL

## 2024-05-07 MED ORDER — ONDANSETRON HCL 4 MG/2ML IJ SOLN: 4.0000 mg | Freq: Once | INTRAMUSCULAR | Status: DC | PRN

## 2024-05-07 MED ORDER — CEFAZOLIN SODIUM-DEXTROSE 2-4 GM/100ML-% IV SOLN
INTRAVENOUS | Status: AC
  Filled 2024-05-07: qty 100

## 2024-05-07 MED ORDER — SCOPOLAMINE 1 MG/3DAYS TD PT72
MEDICATED_PATCH | TRANSDERMAL | Status: DC | PRN
  Administered 2024-05-07: 1 via TRANSDERMAL

## 2024-05-07 MED ORDER — VANCOMYCIN HCL 1000 MG IV SOLR
INTRAVENOUS | Status: DC | PRN
  Administered 2024-05-07: 1000 mg

## 2024-05-07 MED ORDER — OXYCODONE HCL 5 MG PO TABS: 5.0000 mg | ORAL_TABLET | Freq: Once | ORAL | Status: DC | PRN

## 2024-05-07 MED ORDER — DIPHENHYDRAMINE HCL 50 MG/ML IJ SOLN
INTRAMUSCULAR | Status: AC
  Filled 2024-05-07: qty 1

## 2024-05-07 MED ORDER — BUPIVACAINE-EPINEPHRINE (PF) 0.5% -1:200000 IJ SOLN
INTRAMUSCULAR | Status: AC
  Filled 2024-05-07: qty 30

## 2024-05-07 MED ORDER — ORAL CARE MOUTH RINSE: 15.0000 mL | Freq: Once | OROMUCOSAL | Status: AC

## 2024-05-07 MED ORDER — ROCURONIUM BROMIDE 10 MG/ML (PF) SYRINGE
PREFILLED_SYRINGE | INTRAVENOUS | Status: AC
Start: 2024-05-07 — End: 2024-05-07
  Filled 2024-05-07: qty 10

## 2024-05-07 MED ORDER — ONDANSETRON HCL 4 MG PO TABS: 8.0000 mg | ORAL_TABLET | Freq: Three times a day (TID) | ORAL | Status: DC | PRN

## 2024-05-07 MED ORDER — ORAL CARE MOUTH RINSE: 15.0000 mL | OROMUCOSAL | Status: DC | PRN

## 2024-05-07 MED ORDER — INSULIN ASPART 100 UNIT/ML IJ SOLN
10.0000 [IU] | Freq: Two times a day (BID) | INTRAMUSCULAR | Status: DC
  Administered 2024-05-07: 10 [IU] via SUBCUTANEOUS

## 2024-05-07 MED ORDER — PHENYLEPHRINE 80 MCG/ML (10ML) SYRINGE FOR IV PUSH (FOR BLOOD PRESSURE SUPPORT)
PREFILLED_SYRINGE | INTRAVENOUS | Status: DC | PRN
  Administered 2024-05-07 (×2): 80 ug via INTRAVENOUS
  Administered 2024-05-07 (×3): 160 ug via INTRAVENOUS
  Administered 2024-05-07 (×2): 80 ug via INTRAVENOUS

## 2024-05-07 MED ORDER — BUSPIRONE HCL 5 MG PO TABS
10.0000 mg | ORAL_TABLET | Freq: Two times a day (BID) | ORAL | Status: DC
  Administered 2024-05-07 – 2024-05-08 (×2): 10 mg via ORAL
  Filled 2024-05-07 (×2): qty 2

## 2024-05-07 MED ORDER — ACETAMINOPHEN 500 MG PO TABS: 500.0000 mg | ORAL_TABLET | Freq: Four times a day (QID) | ORAL | Status: DC | PRN

## 2024-05-07 MED ORDER — PROMETHAZINE HCL 25 MG/ML IJ SOLN
INTRAMUSCULAR | Status: AC
  Filled 2024-05-07: qty 1

## 2024-05-07 MED ORDER — ONDANSETRON HCL 4 MG/2ML IJ SOLN
INTRAMUSCULAR | Status: AC
  Filled 2024-05-07: qty 2

## 2024-05-07 MED ORDER — MIDAZOLAM HCL 2 MG/2ML IJ SOLN
INTRAMUSCULAR | Status: AC
  Filled 2024-05-07: qty 2

## 2024-05-07 MED ORDER — PROMETHAZINE HCL 25 MG/ML IJ SOLN
INTRAMUSCULAR | Status: DC | PRN
Start: 2024-05-07 — End: 2024-05-07
  Administered 2024-05-07: 12.5 mg via INTRAMUSCULAR

## 2024-05-07 MED ORDER — PROPOFOL 10 MG/ML IV BOLUS
INTRAVENOUS | Status: AC
  Filled 2024-05-07: qty 20

## 2024-05-07 MED ORDER — NOVOLOG 100 UNIT/ML ~~LOC~~ SOLN: 10.0000 [IU] | Freq: Two times a day (BID) | SUBCUTANEOUS | Status: DC

## 2024-05-07 MED ORDER — FENTANYL CITRATE PF 50 MCG/ML IJ SOSY
25.0000 ug | PREFILLED_SYRINGE | INTRAMUSCULAR | Status: DC | PRN
  Administered 2024-05-07: 50 ug via INTRAVENOUS
  Filled 2024-05-07: qty 1

## 2024-05-07 MED ORDER — VANCOMYCIN HCL 1000 MG IV SOLR
INTRAVENOUS | Status: AC
  Filled 2024-05-07: qty 20

## 2024-05-07 MED ORDER — SUGAMMADEX SODIUM 200 MG/2ML IV SOLN
INTRAVENOUS | Status: DC | PRN
  Administered 2024-05-07: 200 mg via INTRAVENOUS

## 2024-05-07 MED ORDER — DEXAMETHASONE SODIUM PHOSPHATE 10 MG/ML IJ SOLN
INTRAMUSCULAR | Status: DC | PRN
  Administered 2024-05-07: 10 mg via INTRAVENOUS

## 2024-05-07 MED ORDER — OXYCODONE HCL 5 MG/5ML PO SOLN: 5.0000 mg | Freq: Once | ORAL | Status: DC | PRN

## 2024-05-07 MED ORDER — CHLORHEXIDINE GLUCONATE 0.12 % MT SOLN
15.0000 mL | Freq: Once | OROMUCOSAL | Status: AC
  Administered 2024-05-07: 15 mL via OROMUCOSAL

## 2024-05-07 MED ORDER — TRANEXAMIC ACID-NACL 1000-0.7 MG/100ML-% IV SOLN
INTRAVENOUS | Status: DC | PRN
  Administered 2024-05-07: 1000 mg via INTRAVENOUS

## 2024-05-07 MED ORDER — CEFAZOLIN SODIUM-DEXTROSE 2-4 GM/100ML-% IV SOLN
2.0000 g | INTRAVENOUS | Status: AC
  Administered 2024-05-07: 2 g via INTRAVENOUS

## 2024-05-07 MED ORDER — HYDROCODONE-ACETAMINOPHEN 10-325 MG PO TABS
1.0000 | ORAL_TABLET | ORAL | Status: DC | PRN
  Administered 2024-05-07 – 2024-05-08 (×5): 1 via ORAL
  Filled 2024-05-07 (×5): qty 1

## 2024-05-07 MED ORDER — ROPIVACAINE HCL 5 MG/ML IJ SOLN
INTRAMUSCULAR | Status: AC
Start: 2024-05-07 — End: 2024-05-07
  Filled 2024-05-07: qty 30

## 2024-05-07 MED ORDER — ROSUVASTATIN CALCIUM 20 MG PO TABS
20.0000 mg | ORAL_TABLET | Freq: Every day | ORAL | Status: DC
  Administered 2024-05-08: 20 mg via ORAL
  Filled 2024-05-07: qty 1

## 2024-05-07 MED ORDER — PROPOFOL 10 MG/ML IV BOLUS
INTRAVENOUS | Status: AC
Start: 2024-05-07 — End: 2024-05-07
  Filled 2024-05-07: qty 20

## 2024-05-07 MED ORDER — LIDOCAINE 2% (20 MG/ML) 5 ML SYRINGE
INTRAMUSCULAR | Status: AC
Start: 2024-05-07 — End: 2024-05-07
  Filled 2024-05-07: qty 5

## 2024-05-07 MED ORDER — ROCURONIUM BROMIDE 10 MG/ML (PF) SYRINGE
PREFILLED_SYRINGE | INTRAVENOUS | Status: DC | PRN
Start: 2024-05-07 — End: 2024-05-07
  Administered 2024-05-07: 80 mg via INTRAVENOUS
  Administered 2024-05-07: 20 mg via INTRAVENOUS

## 2024-05-07 MED ORDER — VENLAFAXINE HCL 37.5 MG PO TABS
75.0000 mg | ORAL_TABLET | Freq: Three times a day (TID) | ORAL | Status: DC
  Administered 2024-05-07 – 2024-05-08 (×2): 75 mg via ORAL
  Filled 2024-05-07 (×2): qty 2

## 2024-05-07 MED ORDER — FENTANYL CITRATE (PF) 100 MCG/2ML IJ SOLN
INTRAMUSCULAR | Status: DC | PRN
  Administered 2024-05-07 (×2): 50 ug via INTRAVENOUS

## 2024-05-07 MED ORDER — PROPOFOL 500 MG/50ML IV EMUL
INTRAVENOUS | Status: DC | PRN
  Administered 2024-05-07: 200 mg via INTRAVENOUS

## 2024-05-07 MED ORDER — LIDOCAINE 2% (20 MG/ML) 5 ML SYRINGE
INTRAMUSCULAR | Status: DC | PRN
  Administered 2024-05-07: 80 mg via INTRAVENOUS

## 2024-05-07 MED ORDER — ONDANSETRON HCL 4 MG/2ML IJ SOLN
INTRAMUSCULAR | Status: DC | PRN
  Administered 2024-05-07: 4 mg via INTRAVENOUS

## 2024-05-07 MED ORDER — ALBUTEROL SULFATE (2.5 MG/3ML) 0.083% IN NEBU: 3.0000 mL | INHALATION_SOLUTION | RESPIRATORY_TRACT | Status: DC | PRN

## 2024-05-07 MED ORDER — TRANEXAMIC ACID-NACL 1000-0.7 MG/100ML-% IV SOLN
INTRAVENOUS | Status: AC
  Filled 2024-05-07: qty 100

## 2024-05-07 MED ORDER — FLUTICASONE PROPIONATE 50 MCG/ACT NA SUSP: 2.0000 | Freq: Every day | NASAL | Status: DC

## 2024-05-07 MED ORDER — SCOPOLAMINE 1 MG/3DAYS TD PT72
MEDICATED_PATCH | TRANSDERMAL | Status: AC
  Filled 2024-05-07: qty 1

## 2024-05-07 MED ORDER — SODIUM CHLORIDE 0.9 % IR SOLN
Status: DC | PRN
Start: 2024-05-07 — End: 2024-05-07
  Administered 2024-05-07: 3000 mL
  Administered 2024-05-07: 1000 mL

## 2024-05-07 MED ORDER — CHLORHEXIDINE GLUCONATE 0.12 % MT SOLN: 15.0000 mL | Freq: Once | OROMUCOSAL | Status: DC

## 2024-05-07 MED ORDER — METFORMIN HCL 500 MG PO TABS
1000.0000 mg | ORAL_TABLET | Freq: Two times a day (BID) | ORAL | Status: DC
  Administered 2024-05-07 – 2024-05-08 (×2): 1000 mg via ORAL
  Filled 2024-05-07 (×2): qty 2

## 2024-05-07 MED ORDER — MIDAZOLAM HCL 2 MG/2ML IJ SOLN
INTRAMUSCULAR | Status: DC | PRN
  Administered 2024-05-07: 2 mg via INTRAVENOUS

## 2024-05-07 MED ORDER — CLOPIDOGREL BISULFATE 75 MG PO TABS
75.0000 mg | ORAL_TABLET | Freq: Every day | ORAL | Status: DC
  Administered 2024-05-08: 75 mg via ORAL
  Filled 2024-05-07: qty 1

## 2024-05-07 MED ORDER — DEXAMETHASONE SODIUM PHOSPHATE 10 MG/ML IJ SOLN
INTRAMUSCULAR | Status: AC
  Filled 2024-05-07: qty 1

## 2024-05-07 MED ORDER — FENTANYL CITRATE (PF) 100 MCG/2ML IJ SOLN
INTRAMUSCULAR | Status: AC
Start: 2024-05-07 — End: 2024-05-07
  Filled 2024-05-07: qty 2

## 2024-05-07 MED ORDER — INSULIN GLARGINE-YFGN 100 UNIT/ML ~~LOC~~ SOLN
72.0000 [IU] | Freq: Two times a day (BID) | SUBCUTANEOUS | Status: DC
  Administered 2024-05-07 – 2024-05-08 (×3): 72 [IU] via SUBCUTANEOUS
  Filled 2024-05-07 (×5): qty 0.72

## 2024-05-07 MED ORDER — DEXMEDETOMIDINE HCL IN NACL 80 MCG/20ML IV SOLN
INTRAVENOUS | Status: AC
  Filled 2024-05-07: qty 20

## 2024-05-07 MED ORDER — PREGABALIN 25 MG PO CAPS
25.0000 mg | ORAL_CAPSULE | Freq: Two times a day (BID) | ORAL | Status: DC
  Administered 2024-05-07 – 2024-05-08 (×2): 25 mg via ORAL
  Filled 2024-05-07 (×2): qty 1

## 2024-05-07 SURGICAL SUPPLY — 70 items
BASEPLATE AUG 24 20D (Plate) IMPLANT
BIT DRILL FLUTED 3.0 STRL (BIT) IMPLANT
BLADE SAW SGTL 83.5X18.5 (BLADE) ×1 IMPLANT
BNDG GAUZE DERMACEA FLUFF 4 (GAUZE/BANDAGES/DRESSINGS) IMPLANT
CALIBRATOR GLENOID VIP 5-D (SYSTAGENIX WOUND MANAGEMENT) IMPLANT
CHLORAPREP W/TINT 26 (MISCELLANEOUS) ×2 IMPLANT
CLOTH BEACON ORANGE TIMEOUT ST (SAFETY) ×1 IMPLANT
COOLER ICEMAN CLASSIC (MISCELLANEOUS) ×1 IMPLANT
COUNTER NDL 20CT MAGNET RED (NEEDLE) ×1 IMPLANT
COVER LIGHT HANDLE STERIS (MISCELLANEOUS) ×2 IMPLANT
CUP SUT UNIV REVERSE 33 +2 LT (Cup) IMPLANT
DRAPE INCISE IOBAN 44X35 STRL (DRAPES) ×1 IMPLANT
DRAPE SHOULDER BEACH CHAIR (DRAPES) ×1 IMPLANT
DRAPE U-SHAPE 47X51 STRL (DRAPES) ×1 IMPLANT
DRESSING AQUACEL AG ADV 3.5X12 (MISCELLANEOUS) ×1 IMPLANT
DRSG TEGADERM 4X4.75 (GAUZE/BANDAGES/DRESSINGS) IMPLANT
ELECT BLADE 6 FLAT ULTRCLN (ELECTRODE) IMPLANT
ELECTRODE REM PT RTRN 9FT ADLT (ELECTROSURGICAL) ×1 IMPLANT
GLENOSPHERE 33+4 LAT/24 UNI RV (Joint) IMPLANT
GLOVE BIO SURGEON STRL SZ7 (GLOVE) IMPLANT
GLOVE BIO SURGEON STRL SZ8 (GLOVE) ×3 IMPLANT
GLOVE BIOGEL PI IND STRL 7.0 (GLOVE) ×2 IMPLANT
GLOVE BIOGEL PI IND STRL 8 (GLOVE) ×1 IMPLANT
GLOVE BIOGEL PI IND STRL 8.5 (GLOVE) IMPLANT
GLOVE ECLIPSE 7.5 STRL STRAW (GLOVE) IMPLANT
GLOVE SKINSENSE STRL SZ8.0 LF (GLOVE) IMPLANT
GOWN STRL REUS W/TWL LRG LVL3 (GOWN DISPOSABLE) ×2 IMPLANT
GOWN STRL REUS W/TWL XL LVL3 (GOWN DISPOSABLE) ×1 IMPLANT
HOOD PEEL AWAY T7 (MISCELLANEOUS) ×3 IMPLANT
KIT POSITION SHOULDER SCHLEI (MISCELLANEOUS) ×1 IMPLANT
KIT SET UNIVERSAL (KITS) IMPLANT
KIT STABILIZATION SHOULDER (MISCELLANEOUS) ×1 IMPLANT
KIT TURNOVER KIT A (KITS) ×1 IMPLANT
LINER GLENOID HUM REV 33 +3 (Liner) IMPLANT
MANIFOLD NEPTUNE II (INSTRUMENTS) ×1 IMPLANT
MARKER SKIN DUAL TIP RULER LAB (MISCELLANEOUS) ×1 IMPLANT
NDL HYPO 21X1.5 SAFETY (NEEDLE) IMPLANT
NDL TAPERED W/ NITINOL LOOP (MISCELLANEOUS) IMPLANT
NEEDLE HYPO 21X1.5 SAFETY (NEEDLE) ×1 IMPLANT
NEEDLE TAPERED W/ NITINOL LOOP (MISCELLANEOUS) ×1 IMPLANT
NS IRRIG 1000ML POUR BTL (IV SOLUTION) ×1 IMPLANT
PACK SRG BSC III STRL LF ECLPS (CUSTOM PROCEDURE TRAY) ×1 IMPLANT
PACK TOTAL JOINT (CUSTOM PROCEDURE TRAY) ×1 IMPLANT
PAD COLD SHLDR SM WRAP-ON (PAD) ×1 IMPLANT
PENCIL SMOKE EVACUATOR (MISCELLANEOUS) IMPLANT
PIN NITINOL TARGETER 2.8 (PIN) IMPLANT
POSITIONER HEAD 8X9X4 ADT (SOFTGOODS) ×1 IMPLANT
POST MODULAR MGS BASEPLATE 25 (Post) IMPLANT
REAMER GLENOID UNIV SM DISP (ORTHOPEDIC DISPOSABLE SUPPLIES) IMPLANT
SCREW PERIPHERAL 5.5X20 LOCK (Screw) IMPLANT
SCREW PERIPHERAL 5.5X28 LOCK (Screw) IMPLANT
SCREW PERIPHERAL NL 4.5X36 (Screw) IMPLANT
SET BASIN LINEN APH (SET/KITS/TRAYS/PACK) ×1 IMPLANT
SET HNDPC FAN SPRY TIP SCT (DISPOSABLE) IMPLANT
SLING ULTRA II L (ORTHOPEDIC SUPPLIES) IMPLANT
SOL .9 NS 3000ML IRR UROMATIC (IV SOLUTION) ×1 IMPLANT
STEM HUM REV UNIV SZ 7 (Stem) IMPLANT
STRIP CLOSURE SKIN 1/2X4 (GAUZE/BANDAGES/DRESSINGS) ×2 IMPLANT
SUT MNCRL AB 4-0 PS2 18 (SUTURE) ×1 IMPLANT
SUT MON AB 2-0 CT1 36 (SUTURE) ×1 IMPLANT
SUT VIC AB 1 CT1 27XBRD ANTBC (SUTURE) ×1 IMPLANT
SUTURE TAPE 1.3 40 TPR END (SUTURE) IMPLANT
SUTURETAPE 1.3 40 W/NDL BLK/WH (SUTURE) ×2 IMPLANT
SYR 30ML LL (SYRINGE) IMPLANT
SYR BULB IRRIG 60ML STRL (SYRINGE) ×1 IMPLANT
TOWEL OR 17X26 4PK STRL BLUE (TOWEL DISPOSABLE) ×1 IMPLANT
TRAY FOLEY W/BAG SLVR 16FR ST (SET/KITS/TRAYS/PACK) ×1 IMPLANT
TUBE CONNECTING 12X1/4 (SUCTIONS) ×1 IMPLANT
WATER STERILE IRR 1000ML POUR (IV SOLUTION) ×1 IMPLANT
YANKAUER SUCT 12FT TUBE ARGYLE (SUCTIONS) ×1 IMPLANT

## 2024-05-07 NOTE — Anesthesia Preprocedure Evaluation (Signed)
 Anesthesia Evaluation  Patient identified by MRN, date of birth, ID band Patient awake    Reviewed: Allergy & Precautions, H&P , NPO status , Patient's Chart, lab work & pertinent test results, reviewed documented beta blocker date and time   Airway Mallampati: II  TM Distance: >3 FB Neck ROM: full    Dental no notable dental hx. (+) Partial Lower, Partial Upper   Pulmonary neg pulmonary ROS, asthma , former smoker   Pulmonary exam normal breath sounds clear to auscultation       Cardiovascular Exercise Tolerance: Good hypertension, negative cardio ROS  Rhythm:regular Rate:Normal     Neuro/Psych  PSYCHIATRIC DISORDERS Anxiety Depression     Neuromuscular disease CVA negative neurological ROS  negative psych ROS   GI/Hepatic negative GI ROS, Neg liver ROS, hiatal hernia,GERD  ,,  Endo/Other  negative endocrine ROSdiabetes  Class 3 obesity  Renal/GU Renal diseasenegative Renal ROS  negative genitourinary   Musculoskeletal   Abdominal   Peds  Hematology negative hematology ROS (+)   Anesthesia Other Findings   Reproductive/Obstetrics negative OB ROS                             Anesthesia Physical Anesthesia Plan  ASA: 3  Anesthesia Plan: General   Post-op Pain Management:    Induction:   PONV Risk Score and Plan: Propofol  infusion  Airway Management Planned:   Additional Equipment:   Intra-op Plan:   Post-operative Plan:   Informed Consent: I have reviewed the patients History and Physical, chart, labs and discussed the procedure including the risks, benefits and alternatives for the proposed anesthesia with the patient or authorized representative who has indicated his/her understanding and acceptance.     Dental Advisory Given  Plan Discussed with: CRNA  Anesthesia Plan Comments:        Anesthesia Quick Evaluation

## 2024-05-07 NOTE — Transfer of Care (Signed)
 Immediate Anesthesia Transfer of Care Note  Patient: Jamie Harding  Procedure(s) Performed: ARTHROPLASTY, SHOULDER, TOTAL, REVERSE (Right: Shoulder)  Patient Location: PACU  Anesthesia Type:General and Regional  Level of Consciousness: awake and drowsy  Airway & Oxygen  Therapy: Patient Spontanous Breathing and Patient connected to face mask oxygen   Post-op Assessment: Report given to RN and Post -op Vital signs reviewed and stable  Post vital signs: Reviewed and stable  Last Vitals:  Vitals Value Taken Time  BP 134/93 05/07/24 10:22  Temp    Pulse 97 05/07/24 10:27  Resp 9 05/07/24 10:27  SpO2 100 % 05/07/24 10:27  Vitals shown include unfiled device data.  Last Pain:  Vitals:   05/07/24 0631  TempSrc: Oral  PainSc: 0-No pain         Complications: No notable events documented.

## 2024-05-07 NOTE — H&P (Signed)
 Below is the most recent clinic note for [employer]; any pertinent information regarding their recent medical history will be updated on the day of surgery.    New Patient Visit  Assessment: Jamie Harding is a 68 y.o. female with the following: 1. Traumatic complete tear of right rotator cuff, subsequent encounter  Plan: Jamie Harding continues to have a a lot of pain, with limited function of the right shoulder after a fall several months ago.  Injections have not been effective.  MRI demonstrates multiple tears, with retraction of the supraspinatus, subscapularis, as well as the biceps tendon.  Based on the constellation of symptoms, as well as the MRI findings, I do think she would benefit from reverse shoulder arthroplasty.  This was discussed with the patient, and she is in agreement.  Neck step will include medical clearance.  Will also need to obtain a CT scan prior to scheduling surgery.  Will plan to schedule surgery, with the assistance of Dr. Phyllis Breeze.  Specifically for medical clearance, we will need to assess her A1c.  In addition, she takes Plavix , so we will need to have a firm plan for stopping the Plavix  prior to surgery.  Patient has also been taking hydrocodone  for 15 years.  In this situation, some patients struggle with pain control following surgery.  In addition, we will need to have a good plan for continued use of narcotics.  I advised her that I would be willing to provide narcotics for up to 6 weeks following surgery.  After that, she will need to return to her physician for regular prescriptions.     History of Present Illness: Jamie Harding is a 68 y.o. female who presents for evaluation of shoulder pain.  She has been evaluated by my partner, Dr. Phyllis Breeze.  Approximately 6-8 months ago, she fell a couple of times.  She had pain in the shoulder.  She tried injections, with limited improvement in her symptoms.  She continues to have a lot of pain and lack  of motion of the right shoulder.  She has obtained an MRI, which demonstrates multiple tears in the rotator cuff.  No prior injuries to her right shoulder.  She is a diabetic.  She is on Plavix  for history of small strokes.  In addition, she has been taking hydrocodone  for approximately 15 years.   Review of Systems: No fevers or chills No numbness or tingling No chest pain No shortness of breath No bowel or bladder dysfunction No GI distress No headaches   Medical History:  Past Medical History:  Diagnosis Date   Anxiety    Asthma    Depression    Essential hypertension    Fatty liver    Gastritis    Gastroparesis    GERD (gastroesophageal reflux disease)    Hiatal hernia    History of cardiac catheterization    Minimal coronary atherosclerosis February 2016   History of kidney stones    History of stroke 2008   HLD (hyperlipidemia)    Neuropathy    Obesity    Pancreatitis    Rheumatoid arthritis(714.0)    Stroke (HCC) 2008   Stroke (HCC) 01/2020   Tibialis tendinitis    Type 2 diabetes mellitus (HCC)     Past Surgical History:  Procedure Laterality Date   ANKLE SURGERY     remove extra bone-left   BIOPSY  04/25/2021   Procedure: BIOPSY;  Surgeon: Vinetta Greening, DO;  [facility];  Service: Endoscopy;;   CARPAL TUNNEL RELEASE     right side   CARPAL TUNNEL RELEASE Left 02/06/2013   Procedure: CARPAL TUNNEL RELEASE ;  Surgeon: Darrin Emerald, MD;  Location: [facility];  Service: Orthopedics;  Laterality: Left;  procedure end 1135   CARPAL TUNNEL RELEASE Right 09/14/2015   Procedure: RIGHT CARPAL TUNNEL RELEASE;  Surgeon: Darrin Emerald, MD;  Location: [facility];  Service: Orthopedics;  Laterality: Right;   CARPAL TUNNEL RELEASE Left 09/28/2015   Procedure: LEFT CARPAL TUNNEL RELEASE;  Surgeon: Darrin Emerald, MD;  Location: [facility];  Service: Orthopedics;  Laterality: Left;  procedure 1   CESAREAN SECTION     CHOLECYSTECTOMY     COLONOSCOPY  WITH PROPOFOL   09/16/2012   ZOX:WRUE sessile polyps ranging between 3-66mm in size were found in the sigmoid colon and rectum (hyperplastic)/Mild diverticulosis was noted throughout the entire examined colon/Small internal hemorrhoids. Repeat in 10 years.   COLONOSCOPY WITH PROPOFOL  N/A 09/24/2022   Procedure: COLONOSCOPY WITH PROPOFOL ;  Surgeon: Vinetta Greening, DO;  [facility];  Service: Endoscopy;  Laterality: N/A;  12:15pm, asa 3   DILATATION & CURETTAGE/HYSTEROSCOPY WITH MYOSURE N/A 08/05/2019   Procedure: DILATATION & CURETTAGE/HYSTEROSCOPY WITH MYOSURE;  Surgeon: Jan Mcgill, MD;  Location: [facility];  Service: Gynecology;  Laterality: N/A;  myosure rep will be here.  Confirmed on 07/30/19 CS   DILATION AND CURETTAGE OF UTERUS     multiple   ESOPHAGOGASTRODUODENOSCOPY  07/29/2009   Mild gastritis, benign path   ESOPHAGOGASTRODUODENOSCOPY (EGD) WITH PROPOFOL   09/16/2012   SLF:Non-erosive gastritis (inflammation) was found; multiple bx/The duodenal mucosa showed no abnormalities in the ampulla and bulb and second portion of the duodenum/NAUSEA/VOMITING MOST LIKELY DUE TO GERD/GASTRITIS   ESOPHAGOGASTRODUODENOSCOPY (EGD) WITH PROPOFOL  N/A 04/25/2021   Surgeon: Vinetta Greening, DO; Gastritis biopsied, normal examined duodenum biopsied.  Pathology revealed focal chronic gastritis, negative for H. Pylori, benign small bowel mucosa.   FINGER SURGERY Left    left little finger-otif of finger   Gastric Emptying  07/27/2009   The amount of activity in the stomach at 120 minutes was 13% which is in the normal range   LASER ABLATION OF THE CERVIX     LEFT AND RIGHT HEART CATHETERIZATION WITH CORONARY ANGIOGRAM N/A 01/03/2015   Procedure: LEFT AND RIGHT HEART CATHETERIZATION WITH CORONARY ANGIOGRAM;  Surgeon: Arlander Bellman, MD;  Location: [facility];  Service: Cardiovascular;  Laterality: N/A;   LITHOTRIPSY     POLYPECTOMY  09/16/2012   Procedure: POLYPECTOMY;   Surgeon: Alyce Jubilee, MD;  Location: [facility];  Service: Endoscopy;  Laterality: N/A;   POLYPECTOMY  09/24/2022   Procedure: POLYPECTOMY;  Surgeon: Vinetta Greening, DO;  [facility];  Service: Endoscopy;;   SAVORY DILATION  09/16/2012   Procedure: SAVORY DILATION;  Surgeon: Alyce Jubilee, MD;  Location: [facility];  Service: Endoscopy;  Laterality: N/A;  16 fr dilation   TRIGGER FINGER RELEASE Left 02/06/2013   Procedure: RELEASE TRIGGER FINGER LEFT LONG FINGER/A-1 PULLEY;  Surgeon: Darrin Emerald, MD;  Location: [facility];  Service: Orthopedics;  Laterality: Left;  procedure began 1136   TRIGGER FINGER RELEASE Right 09/14/2015   Procedure: RIGHT LONG FINGER TRIGGER RELEASE;  Surgeon: Darrin Emerald, MD;  Location: [facility];  Service: Orthopedics;  Laterality: Right;   TRIGGER FINGER RELEASE Left 09/28/2015   Procedure: LEFT RING TRIGGER FINGER RELEASE;  Surgeon: Veatrice Georgis  Phyllis Breeze, MD;  Location: [facility];  Service: Orthopedics;  Laterality: Left;  left ring finger   TRIGGER FINGER RELEASE Right 08/07/2023   Procedure: RELEASE TRIGGER FINGER/A-1 PULLEY Right thumb;  Surgeon: Darrin Emerald, MD;  Location: [facility];  Service: Orthopedics;  Laterality: Right;   TUBAL LIGATION      Family History  Problem Relation Age of Onset   Breast cancer Mother    Diabetes Father    Coronary artery disease Other    Arthritis Other    Asthma Other    Diabetes Sister    Colon cancer Neg Hx    Social History   Tobacco Use   Smoking status: Former    Current packs/day: 0.00    Average packs/day: 0.5 packs/day for 3.0 years (1.5 ttl pk-yrs)    Types: Cigarettes    Start date: 09/10/1984    Quit date: 09/11/1987    Years since quitting: 36.6   Smokeless tobacco: Never  Substance Use Topics   Alcohol use: No   Drug use: No    Allergies  Allergen Reactions   Byetta 10 Mcg Pen [Exenatide] Diarrhea and Nausea And Vomiting      touch of pancreatis   Naproxen Nausea And Vomiting    Pollen Extract    Augmentin  [Amoxicillin -Pot Clavulanate]     diarrhea   Azithromycin Nausea And Vomiting and Rash   Erythromycin Hives        Invokana  [Canagliflozin ]     Urinary issues,yeast infection   Morphine Hives and Rash   Sulfonamide Derivatives Nausea And Vomiting and Rash    Current Meds  Medication Sig   albuterol  (VENTOLIN  HFA) 108 (90 Base) MCG/ACT inhaler INHALE 2 PUFFS INTO THE LUNGS EVERY 4 (FOUR) HOURS AS NEEDED FOR WHEEZING.   busPIRone  (BUSPAR ) 10 MG tablet Take 1 tablet BID PRN   linaclotide  (LINZESS ) 290 MCG CAPS capsule Take 1 capsule (290 mcg total) by mouth daily before breakfast.   metFORMIN  (GLUCOPHAGE ) 1000 MG tablet TAKE ONE TABLET BY MOUTH TWO TIMES A DAY.   ondansetron  (ZOFRAN ) 8 MG tablet TAKE ONE TABLET BY MOUTH EVERY 8 HOURS AS NEEDED FOR NAUSEA/VOMITING.   promethazine  (PHENERGAN ) 25 MG tablet TAKE 1 TABLET BY MOUTH EVERY 8 HOURS AS NEEDED FOR NAUSEA.   rosuvastatin  (CRESTOR ) 20 MG tablet Take 1 tablet (20 mg total) by mouth daily.    Objective: BP (!) 140/81   Pulse 83   Temp 98.5 F (36.9 C) (Oral)   Resp 17   Ht 5' 2 (1.575 m)   Wt 78.9 kg   SpO2 97%   BMI 31.81 kg/m   Physical Exam:  General: Alert and oriented. and No acute distress. Gait: Normal gait.  Right shoulder without deformity.  No redness.  No swelling.  No bruising.  Tenderness palpation of the anterior shoulder.  Positive Jobe's.  Weakness of the subscapularis, as well as with supraspinatus.  Pain with subscapularis tendon testing.  Fingers are warm well-perfused.  Sensation intact in the axillary nerve distribution.  IMAGING: I personally reviewed images previously obtained in clinic  Right shoulder MRI  IMPRESSION: 1. Full-thickness tear within the anterior supraspinatus tendon footprint measuring up to 0.9 cm in AP dimension and with up to 1.5 cm tendon retraction. 2. High-grade partial and full-thickness tearing of the superior 2/3 of the subscapularis tendon  insertion over an approximate 1.8 cm craniocaudal length. 3. Mild-to-moderate supraspinatus and mild superior subscapularis muscle atrophy. 4. The long head of the biceps tendon is  not well visualized proximal to the bicipital groove or within the bicipital groove. Findings are suspicious for a proximal tendon rupture with distal tendon retraction. 5. Mild-to-moderate degenerative changes of the acromioclavicular joint.    Tonita Frater, MD  05/07/2024 7:18 AM

## 2024-05-07 NOTE — Anesthesia Procedure Notes (Signed)
 Procedure Name: Intubation Date/Time: 05/07/2024 7:46 AM  Performed by: Coretha Dew, MDPre-anesthesia Checklist: Patient identified, Emergency Drugs available, Suction available and Patient being monitored Patient Re-evaluated:Patient Re-evaluated prior to induction Oxygen  Delivery Method: Circle system utilized Preoxygenation: Pre-oxygenation with 100% oxygen  Induction Type: Combination inhalational/ intravenous induction Ventilation: Oral airway inserted - appropriate to patient size and Mask ventilation without difficulty Laryngoscope Size: Mac and 3 Grade View: Grade I Tube type: Oral Tube size: 7.0 mm Number of attempts: 1 Airway Equipment and Method: Stylet Placement Confirmation: ETT inserted through vocal cords under direct vision, positive ETCO2, CO2 detector and breath sounds checked- equal and bilateral Secured at: 21 cm Tube secured with: Tape Dental Injury: Teeth and Oropharynx as per pre-operative assessment  Comments: Missing upper dentition and lower dentition. In preoperative condition.

## 2024-05-07 NOTE — Anesthesia Procedure Notes (Addendum)
 Anesthesia Regional Block: Interscalene brachial plexus block   Pre-Anesthetic Checklist: , timeout performed,  Correct Patient, Correct Site, Correct Laterality,  Correct Procedure, Correct Position, site marked,  Risks and benefits discussed,  Surgical consent,  Pre-op evaluation,  At surgeon's request and post-op pain management  Laterality: Right  Prep: chloraprep       Needles:  Injection technique: Single-shot  Needle Type: Stimiplex     Needle Length: 9cm  Needle Gauge: 20     Additional Needles:   Procedures:, nerve stimulator,,, ultrasound used (permanent image in chart),,    Narrative:   Performed by: Personally  Anesthesiologist: Coretha Dew, MD CRNA: Verline Glow, CRNA  Additional Notes: Performed by Missy Amos, SRNA with Cassie Click at bedside throughout the entire procedure.

## 2024-05-07 NOTE — Interval H&P Note (Signed)
 History and Physical Interval Note:  05/07/2024 7:20 AM  Jamie Harding  has presented today for surgery, with the diagnosis of Rotator cuff tear.  The various methods of treatment have been discussed with the patient and family. After consideration of risks, benefits and other options for treatment, the patient has consented to  Procedure(s): ARTHROPLASTY, SHOULDER, TOTAL, REVERSE (Right) as a surgical intervention.  The patient's history has been reviewed, patient examined, no change in status, stable for surgery.  I have reviewed the patient's chart and labs.  Questions were answered to the patient's satisfaction.     Tonita Frater

## 2024-05-07 NOTE — Op Note (Signed)
 Orthopaedic Surgery Operative Note (CSN: [id number])  Jamie Harding  08/06/1956 Date of Surgery: 05/07/2024   Diagnoses:  Rotator cuff tear, irreparable  Procedure: Right reverse shoulder arthroplasty   Operative Finding Successful completion of the planned procedure.    Post-Op Diagnosis: Same Surgeons:Primary: Jamie Frater, MD Assisting: Jamie Emerald, MD - Assistance was required throughout the case for positioning and retraction in order to maximize visibility and complete the case Assistants:  Jamie Harding Location: [facility] ROOM 4 Anesthesia: General with regional anesthesia Antibiotics: Ancef  2 g with local vancomycin  powder 1 g at the surgical site Tourniquet time: N/A Estimated Blood Loss: 250 cc Complications: None Specimens: None   Implants: Implant Name Type Inv. Item Serial No. Manufacturer Lot No. LRB No. Used Action  POST MODULAR MGS BASEPLATE 25 - [id number] Post POST MODULAR MGS BASEPLATE 25  [employer] [id number] Right 1 Implanted  BASEPLATE AUG 24 20D - [id number] Plate BASEPLATE AUG 24 20D  [employer] [id number] Right 1 Implanted  SCREW PERIPHERAL NL 4.5X36 - [id number] Screw SCREW PERIPHERAL NL 4.5X36  [employer] [id number] Right 1 Implanted  SCREW PERIPHERAL 5.5X28 LOCK - [id number] Screw SCREW PERIPHERAL 5.5X28 LOCK  [employer] [id number] Right 1 Implanted  SCREW PERIPHERAL 5.5X20 LOCK - [id number] Screw SCREW PERIPHERAL 5.5X20 LOCK  [employer] [id number] Right 1 Implanted  GLENOSPHERE 33+4 LAT/24 UNI RV - [id number] Joint GLENOSPHERE 33+4 LAT/24 UNI RV  [employer] [id number] Right 1 Implanted  LINER GLENOID HUM REV 33 +3 - [id number] Liner LINER GLENOID HUM REV 33 +3  [employer] [id number] Right 1 Implanted  CUP SUT UNIV REVERSE 33 +2 LT - [id number] Cup CUP SUT UNIV REVERSE 33 +2 LT  [employer] [id number] Right 1 Implanted  STEM HUM REV UNIV SZ 7 - [id number] Stem STEM HUM REV UNIV SZ 7  [employer] [id number] Right 1 Implanted    Indications for  Surgery:   Jamie Harding is a 68 y.o. female with a large rotator cuff tear in the right shoulder, which I did not feel was repairable.  As such, we discussed proceeding with reverse shoulder arthroplasty.  She attempted multiple nonoperative treatments, including medications, therapy and injections.  She continued to have pain and limited function.  As result, she was interested in proceeding with surgery.  Benefits and risks of operative and nonoperative management were discussed prior to surgery with the patient and informed consent form was completed.  Specific risks including, but not limited to, infection, need for additional surgery, bleeding, persistent pain, non-union, implant loosening, malunion, persistent pain, stiffness, dislocation and more severe complications associated with anesthesia were discussed with the patient.  The patient has elected to proceed.  The patient is at an increased risk for infection and poor healing due to associated medical comorbidities including diabetes, which is currently under good control   Procedure:   The patient was identified properly. Informed consent was obtained and the surgical site was marked. The patient was taken up to operating room where general anesthesia was induced.  The patient was positioned in beach chair position.  The right shoulder was prepped and draped in the usual sterile fashion.  Timeout was performed before the beginning of the case.  The patient received appropriate antibiotics prior to making incision.  In addition, the patient received 1 g TXA prior to starting.  This was confirmed during the preincision timeout.  We made an incision for the standard deltopectoral approach was performed with a #10 blade. We  dissected down through the subcutaneous tissues and the cephalic vein was taken laterally with the deltoid. The clavipectoral fascia was incised in line with the incision. Deep retractors were placed. The long head of the  biceps tendon was not present within the bicipital groove.  No tenodesis was attempted.  A small remnant of the biceps tendon was attached to the superior labrum.  The groove was identified, and soft tissue was released in line with the tendon to the level of the glenoid.  The subscapularis was torn, at the superior insertion.  Remaining tendon was taken down in a full thickness layer with capsule along the humeral neck extending inferiorly around the humeral head. We continued releasing the capsule directly off of the osteophytes inferiorly all the way around the corner. This allowed us  to dislocate the humeral head. Multiple tag stitches were placed in the subscapularis tendon to maintain control during the release of the tendon, and for the remainder of the case.   The rotator cuff was carefully examined and noted to be irreperably torn.  The decision was confirmed that a reverse total shoulder was indicated for this patient.  There were osteophytes along the inferior humeral neck.  Small osteophytes were removed using a rongeur.  At this point, the anatomic neck was well visualized.     A humeral cutting guide with a version rod was secured to the anterior aspect of the humeral head. The version was set at 20 of retroversion. A humeral osteotomy was performed with an oscillating saw. The head fragment was passed off to the back table, and used to estimate the humeral head size for our implant. A cut protector was placed.  The humerus was retracted posteriorly and we turned our attention to glenoid exposure.   The subscapularis was again identified and we took care to palpate the axillary nerve anteriorly and verify its position with gentle palpation as well as the tug test.  We then released the SGHL with bovie cautery prior to placing a curved mayo at the junction of the anterior glenoid well above the axillary nerve and bluntly dissecting the subscapularis from the capsule.  We then carefully  protected the axillary nerve as we gently released the inferior capsule to fully mobilize the subscapularis.  An anterior deltoid retractor was then placed as well as a small Hohmann retractor superiorly.   The cartilage on the humeral head, as well as the glenoid was intact.  The remaining labrum was removed circumferentially taking great care not to disrupt the posterior capsule.  We used a curette to remove some of the existing cartilage, in order to improve overall healing to bone.  Based on our preoperative templating, the patient specific glenoid drill guide was placed and used to drill a guide pin in the center, inferior position. The glenoid face was then reamed eccentrically over the guide wire, with additional bone removed from the 12:30 position. The center hole was drilled over the guidepin in a near anatomic angle of version.  The 20 degree full augment baseplate was then press-fit into of the hole.  The post was 25 mm with 2mm lateralization obtaining secure fixation. We next placed an inferior non-locking screw for additional fixation, followed by 2 locking screws.  Next a 33 mm glenosphere was selected and impacted onto the baseplate. The center screw was tightened.    We turned attention back to the humeral side. The cut protector was removed.  A starter awl was used to open the humeral  canal. We next used T-handle straight reamers to ream up to an appropriate fit. We then broached starting with a size 5 broach and broaching up to a 7 which obtained an appropriate fit.  She had good bone quality overall, so we proceeded with a short stem.  The broach handle was removed. We trialed with multiple size tray and polyethylene options and selected a 3 mm poly which provided good stability and range of motion without excess soft tissue tension.  We used an anterior offset.  The shoulder was trialed.  There was good ROM in all planes and the shoulder was stable with no inferior translation.  The  real humeral implants were opened after again confirming sizes.  The trial was removed. #5 Fiberwire x4 sutures passed through the humeral neck for subscap repair. The humeral component was press-fit obtaining a secure fit. A anterior offset tray was selected and impacted onto the stem.  A 33+3 polyethylene liner was impacted onto the stem.  The joint was reduced and thoroughly irrigated with pulsatile lavage. Subscap was repaired back with #2 Fiberwire sutures through holes in the stem. Hemostasis was obtained.  Vanc powder was placed within the joint.  We irrigated copiously.  The deltopectoral interval was reapproximated with #1 Vicryl. The subcutaneous tissues were closed with 2-0 monocryl and the skin was closed with running monocryl.    The wounds were cleaned and dried and an Aquacel dressing was placed. The drapes taken down. The arm was placed into sling with abduction pillow. Patient was awakened, extubated, and transferred to the recovery room in stable condition. There were no intraoperative complications. The sponge, needle, and attention counts were  correct at the end of the case.   Post-operative plan:  The patient will be admitted for over night observation.   We have placed a referral for PT to begin 1-2 weeks postop, prior to the first postoperative clinic visit.  DVT prophylaxis Aspirin  81 mg twice daily for 6 weeks.    Pain control with PRN pain medication preferring oral medicines.   Follow up plan will be scheduled in approximately 10-14 days for incision check and XR.

## 2024-05-08 ENCOUNTER — Encounter (HOSPITAL_COMMUNITY): Payer: Self-pay | Admitting: Orthopedic Surgery

## 2024-05-08 DIAGNOSIS — M75121 Complete rotator cuff tear or rupture of right shoulder, not specified as traumatic: Secondary | ICD-10-CM | POA: Diagnosis not present

## 2024-05-08 LAB — GLUCOSE, CAPILLARY
Glucose-Capillary: 66 mg/dL — ABNORMAL LOW (ref 70–99)
Glucose-Capillary: 196 mg/dL — ABNORMAL HIGH (ref 70–99)

## 2024-05-08 MED ORDER — HYDROCODONE-ACETAMINOPHEN 10-325 MG PO TABS: 1.0000 | ORAL_TABLET | ORAL | 0 refills | Status: DC | PRN

## 2024-05-08 NOTE — Discharge Instructions (Signed)
 Shayde Gervacio A. Ernesta Heading, MD MS [facility] [address] [place],  [place]  [address] Phone: [phone] Fax: [phone]    POST-OPERATIVE INSTRUCTIONS - TOTAL SHOULDER REPLACEMENT    WOUND CARE You may leave the operative dressing in place until your follow-up appointment. KEEP THE INCISIONS CLEAN AND DRY. There may be a small amount of fluid/bleeding leaking at the surgical site. This is normal after surgery.  If it fills with liquid or blood please call us  immediately to change it for you. Use the provided ice machine or Ice packs as often as possible for the first 3-4 days, then as needed for pain relief.  Keep a layer of cloth or a shirt between your skin and the cooling unit to prevent frost bite as it can get very cold.  SHOWERING: - You may shower on Post-Op Day #3.  - The dressing is water  resistant but do not scrub it as it may start to peel up.   - You may remove the sling for showering, but keep a water  resistant pillow under the arm to keep both the  elbow and shoulder away from the body (mimicking the abduction sling).  - Gently pat the area dry.  - Do not soak the shoulder in water . Do not go swimming in the pool or ocean until your sutures are removed. - KEEP THE INCISIONS CLEAN AND DRY.  EXERCISES Wear the sling at all times except when doing your exercises. You may remove the sling for showering, but keep the arm across the chest or in a secondary sling.    Accidental/Purposeful External Rotation and shoulder flexion (reaching behind you) is to be avoided at all costs for the first month. It is ok to come out of your sling if your are sitting and have assistance for eating.  Do not lift anything heavier than 1 pound until we discuss it further in clinic. Please perform the exercises:   Elbow / Hand / Wrist  Range of Motion Exercises Grip strengthening   REGIONAL ANESTHESIA (NERVE BLOCKS) The anesthesia team may have performed a nerve block  for you if safe in the setting of your care.  This is a great tool used to minimize pain.  Typically the block may start wearing off overnight but the long acting medicine may last for 3-4 days.  The nerve block wearing off can be a challenging period but please utilize your as needed pain medications to try and manage this period.    POST-OP MEDICATIONS- Multimodal approach to pain control In general your pain will be controlled with a combination of substances.  Prescriptions unless otherwise discussed are electronically sent to your pharmacy.  This is a carefully made plan we use to minimize narcotic use.      Acetaminophen  - Non-narcotic pain medicine taken as needed.  The strong pain medication also contains acetaminophen .  Please pay attention to how much acetaminophen  you are taking on a regular basis. Hydrocodone  - This is a strong narcotic, to be used only on an "as needed" basis for pain. Plavix  -okay to resume when you return home Zofran  -  take as needed for nausea  Meloxicam /Celebrex - these are anti-inflammatory and pain relievers.  Do not take additional ibuprofen, naproxen or other NSAID while taking this medicine.   FOLLOW-UP If you develop a Fever (>101.5), Redness or Drainage from the surgical incision site, please call our office to arrange for an evaluation. Please call the office to schedule a  follow-up appointment for a wound check, 7-10 days post-operatively.  HELPFUL INFORMATION  If you had a block, it will wear off between 8-24 hrs postop typically.  This is period when your pain may go from nearly zero to the pain you would have had postop without the block.  This is an abrupt transition but nothing dangerous is happening.  You may take an extra dose of narcotic when this happens.  You should wean off your narcotic medicines as soon as you are able.  Most patients will be off or using minimal narcotics before their first postop appointment.   Elevating your leg will  help with swelling and pain control.  You are encouraged to elevate your leg as much as possible in the first couple of weeks following surgery.  Imagine a drop of water  on your toe, and your goal is to get that water  back to your heart.  We suggest you use the pain medication the first night prior to going to bed, in order to ease any pain when the anesthesia wears off. You should avoid taking pain medications on an empty stomach as it will make you nauseous.  Do not drink alcoholic beverages or take illicit drugs when taking pain medications.  In most states it is against the law to drive while you are in a splint or sling.  And certainly against the law to drive while taking narcotics.  You may return to work/school in the next couple of days when you feel up to it.   Pain medication may make you constipated.  Below are a few solutions to try in this order: Decrease the amount of pain medication if you aren't having pain. Drink lots of decaffeinated fluids. Drink prune juice and/or each dried prunes  If the first 3 don't work start with additional solutions Take Colace - an over-the-counter stool softener Take Senokot - an over-the-counter laxative Take Miralax - a stronger over-the-counter laxative   Narcotic Management  Per [facility] clinic policy, our goal is ensure optimal postoperative pain control with a multimodal pain management strategy.  For all [facility] patients, our goal is to wean post-operative narcotic medications by 6 weeks post-operatively.   If this is not possible due to utilization of pain medication prior to surgery, your [facility] doctor will support your acute post-operative pain control for the first 6 weeks postoperatively, with a plan to transition you back to your primary pain team following that. Max Spain will work to ensure a Therapist, occupational.

## 2024-05-08 NOTE — Discharge Summary (Signed)
 Patient ID: CAROLLE ISHII MRN: [id number] DOB/AGE: [birth year] 68 y.o.  Admit date: 05/07/2024 Discharge date: 05/08/2024  Admission Diagnoses: Right rotator cuff tear; irreparable  Discharge Diagnoses:  Principal Problem:   Right rotator cuff tear   Past Medical History:  Diagnosis Date   Anxiety    Asthma    Depression    Essential hypertension    Fatty liver    Gastritis    Gastroparesis    GERD (gastroesophageal reflux disease)    Hiatal hernia    History of cardiac catheterization    Minimal coronary atherosclerosis February 2016   History of kidney stones    History of stroke 2008   HLD (hyperlipidemia)    Neuropathy    Obesity    Pancreatitis    Rheumatoid arthritis(714.0)    Stroke (HCC) 2008   Stroke [facility]) 01/2020   Tibialis tendinitis    Type 2 diabetes mellitus (HCC)      Procedures Performed: Right Reverse Shoulder Arthroplasty  Discharged Condition: good  Hospital Course: Patient brought in as an outpatient for surgery.  Tolerated procedure well.  Was kept for monitoring overnight for pain control and medical monitoring postop and was found to be stable for DC home the morning after surgery.  Patient was evaluated by OT/OT prior to discharge.  Patient was instructed on specific activity restrictions and all questions were answered.  Patient was discharged on POD#1 in stable condition.  They will contact the clinic if they have any concerns upon discharge.    Consults: None  Significant Diagnostic Studies: No additional pertinent studies  Treatments: Surgery  Discharge Exam:     05/08/2024    4:27 AM 05/07/2024    8:01 PM 05/07/2024    4:20 PM  Vitals with BMI  Systolic 139 150 409  Diastolic 66 62 80  Pulse 80 86 85     Alert and oriented, no acute distress  Sling and ice machine fitting appropriately. Dressing is clean dry and intact Active motion intact throughout the hand Sensation intact in the axillary nerve  distribution. Fingers are warm and well perfused   CBC    Latest Ref Rng & Units 04/30/2024    3:11 PM 07/30/2023    2:16 PM 04/25/2022   12:34 PM  CBC  WBC 4.0 - 10.5 K/uL 7.8  9.9  8.6   Hemoglobin 12.0 - 15.0 g/dL 81.1  91.4  78.2   Hematocrit 36.0 - 46.0 % 40.9  45.5  40.9   Platelets 150 - 400 K/uL 307  271  347         Disposition: Discharge disposition: 01-Home or Self Care        Allergies as of 05/08/2024       Reactions   Augmentin  [amoxicillin -pot Clavulanate] Diarrhea   Byetta 10 Mcg Pen [exenatide] Diarrhea, Nausea And Vomiting     touch of pancreatis   Naproxen Nausea And Vomiting   Pollen Extract Other (See Comments)   Unknown    Azithromycin Nausea And Vomiting, Rash   Erythromycin Hives       Invokana  [canagliflozin ] Other (See Comments)   Urinary issues,yeast infection   Morphine Hives, Rash   Sulfonamide Derivatives Nausea And Vomiting, Rash        Medication List     TAKE these medications    acetaminophen  500 MG tablet Commonly known as: TYLENOL  Take 1,000 mg by mouth every 6 (six) hours as needed for mild pain (pain score 1-3).  albuterol  108 (90 Base) MCG/ACT inhaler Commonly known as: Ventolin  HFA INHALE 2 PUFFS INTO THE LUNGS EVERY 4 (FOUR) HOURS AS NEEDED FOR WHEEZING.   busPIRone  10 MG tablet Commonly known as: BUSPAR  Take 1 tablet BID PRN What changed:  how much to take how to take this when to take this reasons to take this   clopidogrel  75 MG tablet Commonly known as: PLAVIX  Take 1 tablet (75 mg total) by mouth daily with breakfast.   dicyclomine  10 MG capsule Commonly known as: BENTYL  Take 1 capsule (10 mg total) by mouth 3 (three) times daily as needed (for abdominal cramping or diarrhea).   HYDROcodone -acetaminophen  10-325 MG tablet Commonly known as: NORCO Take 1 tablet by mouth every 4 (four) hours as needed for up to 7 days. What changed:  how much to take how to take this when to take this reasons  to take this additional instructions Another medication with the same name was removed. Continue taking this medication, and follow the directions you see here.   metFORMIN  1000 MG tablet Commonly known as: GLUCOPHAGE  TAKE ONE TABLET BY MOUTH TWO TIMES A DAY.   multivitamin with minerals Tabs tablet Take 1 tablet by mouth daily.   ondansetron  8 MG tablet Commonly known as: ZOFRAN  TAKE ONE TABLET BY MOUTH EVERY 8 HOURS AS NEEDED FOR NAUSEA/VOMITING. What changed: See the new instructions.   OneTouch Verio Flex System w/Device Kit USE AS DIRECTED TEST BLOOD SUGAR 4 TIMES DAILY.   OneTouch Verio test strip Generic drug: glucose blood USE AS DIRECTED TO TEST BLOOD GLUCOSE 4 TIMES DAILY.   pantoprazole  40 MG tablet Commonly known as: PROTONIX  TAKE (1) TABLET BY MOUTH TWICE A DAY BEFORE A MEAL. What changed: See the new instructions.   pregabalin  25 MG capsule Commonly known as: Lyrica  Take 1 capsule (25 mg total) by mouth 2 (two) times daily.   promethazine  25 MG tablet Commonly known as: PHENERGAN  TAKE 1 TABLET BY MOUTH EVERY 8 HOURS AS NEEDED FOR NAUSEA.   rosuvastatin  20 MG tablet Commonly known as: CRESTOR  Take 1 tablet (20 mg total) by mouth daily.   venlafaxine  75 MG tablet Commonly known as: EFFEXOR  TAKE (1) TABLET BY MOUTH 3 TIMES DAILY What changed: See the new instructions.        Follow-up Information     Tonita Frater, MD Follow up.   Specialties: Orthopedic Surgery, Sports Medicine Why: 10-14 days for suture removal/incision check Contact information: [address] [place] [address] [phone]

## 2024-05-08 NOTE — Evaluation (Signed)
 Physical Therapy Evaluation Patient Details Name: Jamie Harding MRN: [id number] DOB: 05-09-1956 Today's Date: 05/08/2024  History of Present Illness  Jamie Harding continues to have a a lot of pain, with limited function of the right shoulder after a fall several months ago.  Injections have not been effective.  MRI demonstrates multiple tears, with retraction of the supraspinatus, subscapularis, as well as the biceps tendon.  Based on the constellation of symptoms, as well as the MRI findings, I do think she would benefit from reverse shoulder arthroplasty.  This was discussed with the patient, and she is in agreement.  Neck step will include medical clearance.  Will also need to obtain a CT scan prior to scheduling surgery.  Will plan to schedule surgery, with the assistance of Dr. Phyllis Breeze.   Clinical Impression  Patient demonstrates good return for bed mobility, transfers, ambulates with slightly labored movement, no RUE arm swing due to wearing immobilizer, no loss of balance and limited mostly due to fatigue. PLAN:  Patient to be discharged home today and discharged from acute physical therapy to care of nursing for ambulation as tolerated for length of stay with recommendations stated below         If plan is discharge home, recommend the following: Help with stairs or ramp for entrance;Assistance with cooking/housework;A little help with walking and/or transfers;A little help with bathing/dressing/bathroom   Can travel by private vehicle        Equipment Recommendations None recommended by PT  Recommendations for Other Services       Functional Status Assessment Patient has had a recent decline in their functional status and demonstrates the ability to make significant improvements in function in a reasonable and predictable amount of time.     Precautions / Restrictions Precautions Precautions: Fall Recall of Precautions/Restrictions: Intact Restrictions Weight Bearing  Restrictions Per Provider Order: No RUE Weight Bearing Per Provider Order: Non weight bearing      Mobility  Bed Mobility Overal bed mobility: Independent                  Transfers Overall transfer level: Modified independent                      Ambulation/Gait Ambulation/Gait assistance: Modified independent (Device/Increase time) Gait Distance (Feet): 100 Feet Assistive device: None Gait Pattern/deviations: WFL(Within Functional Limits) Gait velocity: decreased     General Gait Details: grossly WFL demonstrating slightly labored movement with good return for ambulating in room, hallway without loss of balance or need for an AD  Stairs            Wheelchair Mobility     Tilt Bed    Modified Rankin (Stroke Patients Only)       Balance Overall balance assessment: Needs assistance Sitting-balance support: Feet supported, No upper extremity supported Sitting balance-Leahy Scale: Good Sitting balance - Comments: good seated at EOB   Standing balance support: During functional activity, No upper extremity supported Standing balance-Leahy Scale: Fair Standing balance comment: fair/good without AD                             Pertinent Vitals/Pain Pain Assessment Pain Assessment: 0-10 Pain Score: 8  Pain Location: R UE Pain Descriptors / Indicators: Sharp, Dull Pain Intervention(s): Limited activity within patient's tolerance, Monitored during session, Repositioned    Home Living Family/patient expects to be discharged to:: Private residence Living Arrangements: Spouse/significant  other Available Help at Discharge: Family;Available 24 hours/day Type of Home: House Home Access: Stairs to enter Entrance Stairs-Rails: None Entrance Stairs-Number of Steps: 2   Home Layout: One level Home Equipment: Shower seat;Grab bars - tub/shower      Prior Function Prior Level of Function : Independent/Modified Independent              Mobility Comments: Community ambulator without AD. ADLs Comments: Independent     Extremity/Trunk Assessment   Upper Extremity Assessment Upper Extremity Assessment: Defer to OT evaluation RUE Deficits / Details: NWB; sling LUE Deficits / Details: WFL    Lower Extremity Assessment Lower Extremity Assessment: Overall WFL for tasks assessed    Cervical / Trunk Assessment Cervical / Trunk Assessment: Normal  Communication   Communication Communication: No apparent difficulties    Cognition Arousal: Alert Behavior During Therapy: WFL for tasks assessed/performed   PT - Cognitive impairments: No apparent impairments                         Following commands: Intact       Cueing Cueing Techniques: Verbal cues     General Comments      Exercises     Assessment/Plan    PT Assessment All further PT needs can be met in the next venue of care  PT Problem List Decreased strength;Decreased range of motion;Decreased activity tolerance;Decreased mobility       PT Treatment Interventions      PT Goals (Current goals can be found in the Care Plan section)  Acute Rehab PT Goals Patient Stated Goal: return home with family to assist PT Goal Formulation: With patient Time For Goal Achievement: 05/08/24 Potential to Achieve Goals: Good    Frequency       Co-evaluation PT/OT/SLP Co-Evaluation/Treatment: Yes Reason for Co-Treatment: To address functional/ADL transfers PT goals addressed during session: Mobility/safety with mobility;Balance OT goals addressed during session: ADL's and self-care       AM-PAC PT 6 Clicks Mobility  Outcome Measure Help needed turning from your back to your side while in a flat bed without using bedrails?: None Help needed moving from lying on your back to sitting on the side of a flat bed without using bedrails?: None Help needed moving to and from a bed to a chair (including a wheelchair)?: None Help needed standing up  from a chair using your arms (e.g., wheelchair or bedside chair)?: None Help needed to walk in hospital room?: A Little Help needed climbing 3-5 steps with a railing? : A Little 6 Click Score: 22    End of Session   Activity Tolerance: Patient tolerated treatment well;Patient limited by fatigue Patient left: in bed;with call bell/phone within reach Nurse Communication: Mobility status PT Visit Diagnosis: Muscle weakness (generalized) (M62.81);Unsteadiness on feet (R26.81);Other abnormalities of gait and mobility (R26.89)    Time: 4782-9562 PT Time Calculation (min) (ACUTE ONLY): 8 min   Charges:   PT Evaluation $PT Eval Low Complexity: 1 Low PT Treatments $Gait Training: 8-22 mins PT General Charges $$ ACUTE PT VISIT: 1 Visit         10:20 AM, 05/08/24 Walton Guppy, MPT Physical Therapist with [facility] [phone] office [phone] mobile phone

## 2024-05-08 NOTE — Progress Notes (Signed)
 LCSW made referral to [facility] at pt's request.    05/08/24 0839  TOC Brief Assessment  Insurance and Status Reviewed  Patient has primary care physician Yes  Home environment has been reviewed Lives with husband.  Prior level of function: Independent.  Prior/Current Home Services No current home services  Social Drivers of Health Review SDOH reviewed no interventions necessary  Readmission risk has been reviewed Yes  Transition of care needs transition of care needs identified, TOC will continue to follow (Referral to outpatient rehab.)

## 2024-05-08 NOTE — Care Management Obs Status (Signed)
 MEDICARE OBSERVATION STATUS NOTIFICATION   Patient Details  Name: Jamie Harding MRN: [id number] Date of Birth: [birth year]   Medicare Observation Status Notification Given:  Yes    Neila Bally 05/08/2024, 9:59 AM

## 2024-05-08 NOTE — Evaluation (Signed)
 Occupational Therapy Evaluation Patient Details Name: Jamie Harding MRN: [id number] DOB: Dec 19, 1955 Today's Date: 05/08/2024   History of Present Illness   Jamie Harding continues to have a a lot of pain, with limited function of the right shoulder after a fall several months ago.  Injections have not been effective.  MRI demonstrates multiple tears, with retraction of the supraspinatus, subscapularis, as well as the biceps tendon.  Based on the constellation of symptoms, as well as the MRI findings, I do think she would benefit from reverse shoulder arthroplasty.  This was discussed with the patient, and she is in agreement.  Neck step will include medical clearance.  Will also need to obtain a CT scan prior to scheduling surgery.  Will plan to schedule surgery, with the assistance of Dr. Phyllis Breeze. (per MD)     Clinical Impressions  Pt agreeable to OT and PT co-evaluation. Pt's sling adjusted to better fit during session. Pt reports having husband at home for assist as needed. Mod I to min A for most ADL's at this time per clinical judgement. Able to ambulate independently in the room and hall. Pt is not recommended for further acute OT services and will be discharged to care of nursing staff for remaining length of stay.     If plan is discharge home, recommend the following:   A little help with bathing/dressing/bathroom;Assist for transportation;Assistance with cooking/housework     Functional Status Assessment   Patient has had a recent decline in their functional status and demonstrates the ability to make significant improvements in function in a reasonable and predictable amount of time.     Equipment Recommendations   None recommended by OT     Recommendations for Other Services         Precautions/Restrictions   Precautions Precautions: Fall Recall of Precautions/Restrictions: Intact Restrictions Weight Bearing Restrictions Per Provider Order: No RUE Weight  Bearing Per Provider Order: Non weight bearing     Mobility Bed Mobility Overal bed mobility: Independent                  Transfers Overall transfer level: Modified independent                 General transfer comment: Wearing sling while ambulating in hall.      Balance Overall balance assessment: Mild deficits observed, not formally tested                                         ADL either performed or assessed with clinical judgement   ADL Overall ADL's : Needs assistance/impaired     Grooming: Set up;Modified independent;Standing   Upper Body Bathing: Set up;Minimal assistance;Sitting   Lower Body Bathing: Minimal assistance;Sitting/lateral leans;Modified independent   Upper Body Dressing : Modified independent;Minimal assistance   Lower Body Dressing: Modified independent;Minimal assistance   Toilet Transfer: Modified Independent;Ambulation Toilet Transfer Details (indicate cue type and reason): Simulated via ambulation in the hall.                 Vision Baseline Vision/History: 1 Wears glasses Ability to See in Adequate Light: 1 Impaired Patient Visual Report: No change from baseline Vision Assessment?: No apparent visual deficits     Perception Perception: Not tested       Praxis Praxis: Not tested       Pertinent Vitals/Pain Pain Assessment Pain Assessment: 0-10  Pain Score: 8  Pain Location: R UE Pain Descriptors / Indicators: Jones Ness     Extremity/Trunk Assessment Upper Extremity Assessment Upper Extremity Assessment: RUE deficits/detail;LUE deficits/detail RUE Deficits / Details: NWB; sling LUE Deficits / Details: WFL   Lower Extremity Assessment Lower Extremity Assessment: Defer to PT evaluation   Cervical / Trunk Assessment Cervical / Trunk Assessment: Normal   Communication Communication Communication: No apparent difficulties   Cognition Arousal: Alert Behavior During Therapy: WFL for  tasks assessed/performed Cognition: No apparent impairments                               Following commands: Intact       Cueing  General Comments   Cueing Techniques: Verbal cues                 Home Living Family/patient expects to be discharged to:: Private residence Living Arrangements: Spouse/significant other Available Help at Discharge: Family;Available 24 hours/day Type of Home: House Home Access: Stairs to enter [employer] of Steps: 2 Entrance Stairs-Rails: None Home Layout: One level;Other (Comment) (2 steps from the den)     [employer] Shower/Tub: Producer, television/film/video: Standard Bathroom Accessibility: Yes How Accessible: Accessible via walker Home Equipment: Shower seat;Grab bars - tub/shower          Prior Functioning/Environment Prior Level of Function : Independent/Modified Independent             Mobility Comments: Tourist information centre manager without AD. ADLs Comments: Independent                            Co-evaluation PT/OT/SLP Co-Evaluation/Treatment: Yes Reason for Co-Treatment: To address functional/ADL transfers   OT goals addressed during session: ADL's and self-care      AM-PAC OT 6 Clicks Daily Activity     Outcome Measure Help from another person eating meals?: None Help from another person taking care of personal grooming?: None Help from another person toileting, which includes using toliet, bedpan, or urinal?: None Help from another person bathing (including washing, rinsing, drying)?: None Help from another person to put on and taking off regular upper body clothing?: A Little Help from another person to put on and taking off regular lower body clothing?: A Little 6 Click Score: 22   End of Session    Activity Tolerance: Patient tolerated treatment well Patient left: in bed;with call bell/phone within reach  OT Visit Diagnosis: Muscle weakness (generalized) (M62.81)                 Time: 4098-1191 OT Time Calculation (min): 10 min Charges:  OT General Charges $OT Visit: 1 Visit OT Evaluation $OT Eval Low Complexity: 1 Low  Yostin Malacara OT, MOT  Thurnell Floss 05/08/2024, 9:57 AM

## 2024-05-09 NOTE — Anesthesia Postprocedure Evaluation (Signed)
 Anesthesia Post Note  Patient: Jamie Harding  Procedure(s) Performed: ARTHROPLASTY, SHOULDER, TOTAL, REVERSE (Right: Shoulder)  Patient location during evaluation: Phase II Anesthesia Type: General Level of consciousness: awake Pain management: pain level controlled Vital Signs Assessment: post-procedure vital signs reviewed and stable Respiratory status: spontaneous breathing and respiratory function stable Cardiovascular status: blood pressure returned to baseline and stable Postop Assessment: no headache and no apparent nausea or vomiting Anesthetic complications: no Comments: Late entry   No notable events documented.   Last Vitals:  Vitals:   05/07/24 2001 05/08/24 0427  BP: (!) 150/62 139/66  Pulse: 86 80  Resp: 20 20  Temp: 37.6 C 37 C  SpO2: 97% 96%    Last Pain:  Vitals:   05/08/24 0859  TempSrc:   PainSc: 9                  Yvonna JINNY Bosworth

## 2024-05-15 ENCOUNTER — Telehealth: Payer: Self-pay | Admitting: Family Medicine

## 2024-05-15 ENCOUNTER — Ambulatory Visit: Admitting: Family Medicine

## 2024-05-15 ENCOUNTER — Encounter: Payer: Self-pay | Admitting: Family Medicine

## 2024-05-15 VITALS — BP 132/85 | HR 100 | Temp 97.8°F | Ht 62.0 in

## 2024-05-15 DIAGNOSIS — G894 Chronic pain syndrome: Secondary | ICD-10-CM

## 2024-05-15 DIAGNOSIS — K219 Gastro-esophageal reflux disease without esophagitis: Secondary | ICD-10-CM

## 2024-05-15 DIAGNOSIS — M25511 Pain in right shoulder: Secondary | ICD-10-CM | POA: Diagnosis not present

## 2024-05-15 DIAGNOSIS — K3184 Gastroparesis: Secondary | ICD-10-CM

## 2024-05-15 DIAGNOSIS — I10 Essential (primary) hypertension: Secondary | ICD-10-CM

## 2024-05-15 DIAGNOSIS — E1169 Type 2 diabetes mellitus with other specified complication: Secondary | ICD-10-CM | POA: Diagnosis not present

## 2024-05-15 DIAGNOSIS — Z79891 Long term (current) use of opiate analgesic: Secondary | ICD-10-CM | POA: Diagnosis not present

## 2024-05-15 DIAGNOSIS — E785 Hyperlipidemia, unspecified: Secondary | ICD-10-CM

## 2024-05-15 MED ORDER — PROMETHAZINE HCL 25 MG PO TABS: 25.0000 mg | ORAL_TABLET | Freq: Three times a day (TID) | ORAL | 3 refills | Status: AC | PRN

## 2024-05-15 MED ORDER — HYDROCODONE-ACETAMINOPHEN 10-325 MG PO TABS: ORAL_TABLET | ORAL | 0 refills | Status: DC

## 2024-05-15 MED ORDER — VENLAFAXINE HCL 75 MG PO TABS: 75.0000 mg | ORAL_TABLET | Freq: Two times a day (BID) | ORAL | 1 refills | Status: DC

## 2024-05-15 MED ORDER — PREGABALIN 25 MG PO CAPS: 25.0000 mg | ORAL_CAPSULE | Freq: Two times a day (BID) | ORAL | 5 refills | Status: DC

## 2024-05-15 MED ORDER — ROSUVASTATIN CALCIUM 20 MG PO TABS: 20.0000 mg | ORAL_TABLET | Freq: Every day | ORAL | 1 refills | Status: DC

## 2024-05-15 MED ORDER — PANTOPRAZOLE SODIUM 40 MG PO TBEC: DELAYED_RELEASE_TABLET | ORAL | 0 refills | Status: DC

## 2024-05-15 MED ORDER — BUSPIRONE HCL 10 MG PO TABS: ORAL_TABLET | ORAL | 5 refills | Status: DC

## 2024-05-15 MED ORDER — CLOPIDOGREL BISULFATE 75 MG PO TABS: 75.0000 mg | ORAL_TABLET | Freq: Every day | ORAL | 1 refills | Status: DC

## 2024-05-15 NOTE — Progress Notes (Signed)
 Subjective:    Patient ID: Jamie Harding, female    DOB: 1956-02-19, 68 y.o.   MRN: [id number]  HPI This patient was seen today for chronic pain  The medication list was reviewed and updated.   Location of Pain for which the patient has been treated with regarding narcotics: shoulder   Onset of this pain: Been present for years   -Compliance with medication: Good compliance  - Number patient states they take daily: 4-5 has taken more recently due to shoulder surgery/pain   -Reason for ongoing use of opioids neuropathy  in legs, pack trouble and shoulder pain   What other measures have been tried outside of opioids anti inflammatory, injections and Tylenol   In the ongoing specialists regarding this condition has had recent surgery with her right shoulder with orthopedics because of the pain she has been taking pain medicine 4-5 times per day  -when was the last dose patient took? 9am 05/15/24  The patient was advised the importance of maintaining medication and not using illegal substances with these.  Here for refills and follow up  The patient was educated that we can provide 3 monthly scripts for their medication, it is their responsibility to follow the instructions.  Side effects or complications from medications: Denies side effects  Patient is aware that pain medications are meant to minimize the severity of the pain to allow their pain levels to improve to allow for better function. They are aware of that pain medications cannot totally remove their pain.  Due for UDT ( at least once per year) (pain management contract is also completed at the time of the UDT): 11/07/23  Scale of 1 to 10 ( 1 is least 10 is most) Your pain level without the medicine: 10 Your pain level with medication 4-5  Scale 1 to 10 ( 1-helps very little, 10 helps very well) How well does your pain medication reduce your pain so you can function better through out the day? 3  Quality of the  pain: Aching throbbing  Persistence of the pain: Persistent all time  Modifying factors: Worse with activity    Discussed the use of AI scribe software for clinical note transcription with the patient, who gave verbal consent to proceed.  History of Present Illness   Jamie Harding is a 68 year old female who presents for follow-up after right shoulder surgery.  Postoperative right shoulder pain and rehabilitation - Recovering from right shoulder surgery - Undergoing physical therapy, expected duration six to twelve months - Uses hydrocodone , four tablets daily, for pain control - Nausea and vomiting associated with pain medication use; symptoms improved with use of a nausea patch behind the ear  Medication-related adverse effects - Nausea and vomiting with pain medication use - Symptom improvement after using a nausea patch behind the ear  Right ear symptoms - Popping, sensation of being stopped up, and feeling of something running in the right ear - Symptoms ongoing; monitoring for resolution over the next three weeks  Chronic medical conditions and medication use - Takes pregabalin , metformin  twice daily, cholesterol medication, hydrocodone , dicyclomine  as needed for intestinal cramps, Zofran  for nausea, pantoprazole  twice daily for acid reflux, Buspar  for anxiety, Plavix , Phenergan , and venlafaxine  (two in the morning, one in the evening)          Review of Systems     Objective:   Physical Exam General-in no acute distress Eyes-no discharge Lungs-respiratory rate normal, CTA CV-no murmurs,RRR Extremities skin warm  dry no edema Neuro grossly normal Behavior normal, alert Foot exam normal       Assessment & Plan:  Assessment and Plan    Postoperative care for right shoulder surgery Recovering from surgery with expected rehabilitation of six to twelve months. Pain managed with four tablets daily. Transition to home exercises after six months of therapy  discussed. - Continue physical therapy for at least six months. - Transition to home-based exercises post-therapy. - Prescribe hydrocodone  and send prescription to pharmacy for same-day pickup.  Gastroparesis Recent exacerbation likely due to surgery. Symptoms improved with scopolamine  patch. - Continue use of scopolamine  patches as needed. - Prescribe ondansetron  for nausea.  Eustachian tube dysfunction Right ear discomfort with popping and fluid sensation. No infection or fluid observed. - Monitor symptoms for three weeks. - Refer to ENT if symptoms persist.  Chronic pain management On pregabalin . Prescription requires updating. - Update prescription for pregabalin .  Diabetes Mellitus On metformin  for management. - Continue metformin  twice daily.  Hyperlipidemia On regular cholesterol medication. - Continue cholesterol medication.  Anxiety On buspirone . Prescription requires refilling. - Refill prescription for buspirone .  Depression On venlafaxine , taking two in the morning and one in the evening. - Continue venlafaxine  as prescribed.  General Health Maintenance Due for mammogram by fall. Eye exam scheduled next month. - Schedule mammogram by fall. - Attend scheduled eye examination next month and request report to be sent to Dr. Quita.  Follow-up Follow-up in three months. Additional pain medication prescription available if needed. - Schedule follow-up appointment in three months. - Provide additional prescription for pain medication if needed before next appointment.     1. Gastroparesis She has chronic nausea with this makes it hard to eat continue medication has nausea medicine - pantoprazole  (PROTONIX ) 40 MG tablet; TAKE (1) TABLET BY MOUTH TWICE A DAY BEFORE A MEAL.  Dispense: 180 tablet; Refill: 0  2. Gastroesophageal reflux disease, unspecified whether esophagitis present Reflux under good control with this medicine continue it - pantoprazole  (PROTONIX )  40 MG tablet; TAKE (1) TABLET BY MOUTH TWICE A DAY BEFORE A MEAL.  Dispense: 180 tablet; Refill: 0  3. Right shoulder pain, unspecified chronicity (Primary) Will start physical therapy soon using pain medicine for 5 times a day  4. Hyperlipidemia associated with type 2 diabetes mellitus (HCC) Goal is to keep LDL under control continue medication lab work later this year  5. Essential hypertension Blood pressure good control lab work later this year most recent lab work in May reviewed  6. Chronic pain syndrome Mainly from her back but is also contributing because of her neuropathy in her feet as well as recent right shoulder issues  7. Long-term current use of opiate analgesic Pain medication prescribed PDMP checked Benefit outweighs risk Rationale discussed with patient Patient desires to continue medicine The patient was seen in followup for chronic pain. A review over at their current pain status was discussed. Drug registry was checked. Prescriptions were given.  Regular follow-up recommended. Discussion was held regarding the importance of compliance with medication as well as pain medication contract.  Patient was informed that medication may cause drowsiness and should not be combined  with other medications/alcohol or street drugs. If the patient feels medication is causing altered alertness then do not drive or operate dangerous equipment.  Should be noted that the patient appears to be meeting appropriate use of opioids and response.  Evidenced by improved function and decent pain control without significant side effects and no evidence of  overt aberrancy issues.  Upon discussion with the patient today they understand that opioid therapy is optional and they feel that the pain has been refractory to reasonable conservative measures and is significant and affecting quality of life enough to warrant ongoing therapy and wishes to continue opioids.  Refills were provided.  [facility] medical Board guidelines regarding the pain medicine has been reviewed.  CDC guidelines most updated 2022 has been reviewed by the prescriber.  PDMP is checked on a regular basis yearly urine drug screen and pain management contract  Treatment plan for this patient includes #1-gentle stretching exercises as shown daily basis 2.  Mild strength exercises 3 times per week #3 continue pain medications #4 notify us  if any digression I have encouraged her to try to stick with 4/day if possible

## 2024-05-15 NOTE — Telephone Encounter (Signed)
 Patient will need to follow-up regarding pain in the ballpark of September 20 through September 25 if that is not available then early October or early September thank you  I realize there are tight slots but trying to keep pain management patients with myself rather than putting this on the other providers

## 2024-05-18 ENCOUNTER — Other Ambulatory Visit: Payer: Self-pay

## 2024-05-18 DIAGNOSIS — Z79899 Other long term (current) drug therapy: Secondary | ICD-10-CM

## 2024-05-18 DIAGNOSIS — E1169 Type 2 diabetes mellitus with other specified complication: Secondary | ICD-10-CM

## 2024-05-18 DIAGNOSIS — I10 Essential (primary) hypertension: Secondary | ICD-10-CM

## 2024-05-18 DIAGNOSIS — E1159 Type 2 diabetes mellitus with other circulatory complications: Secondary | ICD-10-CM

## 2024-05-18 NOTE — Telephone Encounter (Signed)
 Patient has appt scheduled 08/12/24 at 3:10pm with Dr Glendia

## 2024-05-19 ENCOUNTER — Encounter: Admitting: Orthopedic Surgery

## 2024-05-19 ENCOUNTER — Emergency Department (HOSPITAL_COMMUNITY)

## 2024-05-19 ENCOUNTER — Other Ambulatory Visit: Payer: Self-pay

## 2024-05-19 ENCOUNTER — Ambulatory Visit (INDEPENDENT_AMBULATORY_CARE_PROVIDER_SITE_OTHER): Admitting: Orthopedic Surgery

## 2024-05-19 ENCOUNTER — Emergency Department (HOSPITAL_COMMUNITY)
Admission: EM | Admit: 2024-05-19 | Discharge: 2024-05-19 | Disposition: A | Discharge status: Home / Self Care | Attending: Emergency Medicine | Admitting: Emergency Medicine

## 2024-05-19 ENCOUNTER — Encounter (HOSPITAL_COMMUNITY): Payer: Self-pay

## 2024-05-19 ENCOUNTER — Encounter: Payer: Self-pay | Admitting: Orthopedic Surgery

## 2024-05-19 ENCOUNTER — Telehealth: Payer: Self-pay | Admitting: Orthopedic Surgery

## 2024-05-19 ENCOUNTER — Other Ambulatory Visit (INDEPENDENT_AMBULATORY_CARE_PROVIDER_SITE_OTHER): Payer: Self-pay

## 2024-05-19 DIAGNOSIS — R55 Syncope and collapse: Secondary | ICD-10-CM | POA: Diagnosis present

## 2024-05-19 DIAGNOSIS — Y92009 Unspecified place in unspecified non-institutional (private) residence as the place of occurrence of the external cause: Secondary | ICD-10-CM | POA: Diagnosis not present

## 2024-05-19 DIAGNOSIS — R82998 Other abnormal findings in urine: Secondary | ICD-10-CM | POA: Insufficient documentation

## 2024-05-19 DIAGNOSIS — I10 Essential (primary) hypertension: Secondary | ICD-10-CM | POA: Diagnosis not present

## 2024-05-19 DIAGNOSIS — Z96611 Presence of right artificial shoulder joint: Secondary | ICD-10-CM | POA: Diagnosis not present

## 2024-05-19 DIAGNOSIS — W1839XA Other fall on same level, initial encounter: Secondary | ICD-10-CM | POA: Diagnosis not present

## 2024-05-19 DIAGNOSIS — Z7901 Long term (current) use of anticoagulants: Secondary | ICD-10-CM | POA: Insufficient documentation

## 2024-05-19 DIAGNOSIS — W19XXXA Unspecified fall, initial encounter: Secondary | ICD-10-CM | POA: Diagnosis not present

## 2024-05-19 DIAGNOSIS — M19019 Primary osteoarthritis, unspecified shoulder: Secondary | ICD-10-CM

## 2024-05-19 DIAGNOSIS — S0990XA Unspecified injury of head, initial encounter: Secondary | ICD-10-CM | POA: Diagnosis not present

## 2024-05-19 DIAGNOSIS — I6782 Cerebral ischemia: Secondary | ICD-10-CM | POA: Diagnosis not present

## 2024-05-19 DIAGNOSIS — N390 Urinary tract infection, site not specified: Secondary | ICD-10-CM | POA: Diagnosis not present

## 2024-05-19 LAB — URINALYSIS, ROUTINE W REFLEX MICROSCOPIC
Bilirubin Urine: NEGATIVE
Glucose, UA: NEGATIVE mg/dL
Hgb urine dipstick: NEGATIVE
Ketones, ur: NEGATIVE mg/dL
Nitrite: POSITIVE — AB
Protein, ur: NEGATIVE mg/dL
Specific Gravity, Urine: 1.013 (ref 1.005–1.030)
pH: 5 (ref 5.0–8.0)

## 2024-05-19 LAB — COMPREHENSIVE METABOLIC PANEL WITH GFR
ALT: 12 U/L (ref 0–44)
AST: 14 U/L — ABNORMAL LOW (ref 15–41)
Albumin: 3.2 g/dL — ABNORMAL LOW (ref 3.5–5.0)
Alkaline Phosphatase: 62 U/L (ref 38–126)
Anion gap: 9 (ref 5–15)
BUN: 9 mg/dL (ref 8–23)
CO2: 27 mmol/L (ref 22–32)
Calcium: 8.6 mg/dL — ABNORMAL LOW (ref 8.9–10.3)
Chloride: 105 mmol/L (ref 98–111)
Creatinine, Ser: 0.81 mg/dL (ref 0.44–1.00)
GFR, Estimated: >60 mL/min (ref >60)
Glucose, Bld: 154 mg/dL — ABNORMAL HIGH (ref 70–99)
Potassium: 3.7 mmol/L (ref 3.5–5.1)
Sodium: 141 mmol/L (ref 135–145)
Total Bilirubin: 0.4 mg/dL (ref 0.0–1.2)
Total Protein: 6.7 g/dL (ref 6.5–8.1)

## 2024-05-19 LAB — DIFFERENTIAL
Abs Immature Granulocytes: 0.03 10*3/uL (ref 0.00–0.07)
Basophils Absolute: 0 10*3/uL (ref 0.0–0.1)
Basophils Relative: 1 %
Eosinophils Absolute: 0.3 10*3/uL (ref 0.0–0.5)
Eosinophils Relative: 3 %
Immature Granulocytes: 0 %
Lymphocytes Relative: 26 %
Lymphs Abs: 2.1 10*3/uL (ref 0.7–4.0)
Monocytes Absolute: 0.5 10*3/uL (ref 0.1–1.0)
Monocytes Relative: 6 %
Neutro Abs: 5.2 10*3/uL (ref 1.7–7.7)
Neutrophils Relative %: 64 %

## 2024-05-19 LAB — PROTIME-INR
INR: 1 (ref 0.8–1.2)
Prothrombin Time: 13.9 s (ref 11.4–15.2)

## 2024-05-19 LAB — CBC
HCT: 34.9 % — ABNORMAL LOW (ref 36.0–46.0)
Hemoglobin: 11.6 g/dL — ABNORMAL LOW (ref 12.0–15.0)
MCH: 30.4 pg (ref 26.0–34.0)
MCHC: 33.2 g/dL (ref 30.0–36.0)
MCV: 91.6 fL (ref 80.0–100.0)
Platelets: 398 10*3/uL (ref 150–400)
RBC: 3.81 MIL/uL — ABNORMAL LOW (ref 3.87–5.11)
RDW: 12.4 % (ref 11.5–15.5)
WBC: 8.2 10*3/uL (ref 4.0–10.5)
nRBC: 0 % (ref 0.0–0.2)

## 2024-05-19 LAB — APTT: aPTT: 27 s (ref 24–36)

## 2024-05-19 LAB — CBG MONITORING, ED: Glucose-Capillary: 149 mg/dL — ABNORMAL HIGH (ref 70–99)

## 2024-05-19 LAB — ETHANOL: Alcohol, Ethyl (B): <15 mg/dL (ref <15)

## 2024-05-19 MED ORDER — CEPHALEXIN 500 MG PO CAPS: 500.0000 mg | ORAL_CAPSULE | Freq: Three times a day (TID) | ORAL | 0 refills | Status: DC

## 2024-05-19 MED ORDER — CEPHALEXIN 500 MG PO CAPS
500.0000 mg | ORAL_CAPSULE | Freq: Once | ORAL | Status: AC
  Administered 2024-05-19: 500 mg via ORAL
  Filled 2024-05-19: qty 1

## 2024-05-19 NOTE — ED Notes (Signed)
 ED Provider at bedside.

## 2024-05-19 NOTE — Telephone Encounter (Signed)
 DR. ONESIMO   If you do go to the hospital and see her he would like for you to call him and let him know her sister is going to be with her in the ER.  Jamie Harding  [phone]    Sister name is Shona [phone]

## 2024-05-19 NOTE — Telephone Encounter (Signed)
 DR. ONESIMO   Patient son called and states she will not be able to make her appt today she fell this morning and her arm is hurting and she doesn't remember having surgery last week or that she had an appt this morning she is going to [facility] ER.

## 2024-05-19 NOTE — ED Triage Notes (Signed)
 Family called EMS for stroke like symptoms after unwitnessed fall.  Patient recently had right shoulder surgery but has equal grips and no neuro deficits noted.  Patient unsure if she hit he rhead  Patient does take plavix .  No other complaints from patient.

## 2024-05-19 NOTE — ED Notes (Signed)
 Pt ambulated to bathroom to void, specimen collected and sent to lab. Pt tolerated ambulation independently, denies dizziness or lightheadedness. Pt given sandwich and ginger ale per pt and MD request.

## 2024-05-19 NOTE — Progress Notes (Signed)
 Orthopaedic Postop Note  Assessment: Jamie Harding is a 68 y.o. female s/p Right Reverse Shoulder Arthroplasty  DOS: 05/01/2024  Plan: Sutures were trimmed, steri strips were placed Ok to remove the abduction pillow; can stop using the sling around 4 weeks postop Physical therapy prescription and protocol provided Anticipated progression discussed, XR reviewed in clinic Follow up 4 weeks   No orders of the defined types were placed in this encounter.    Follow-up: Return in about 4 weeks (around 06/16/2024).  XR at next visit: Right shoulder  Subjective:  Chief Complaint  Patient presents with   Post-op Follow-up    History of Present Illness: Jamie Harding is a 68 y.o. female who presents following the above stated procedure.  Surgery was approximately 2 weeks ago.  Did well.  She woke up this morning and was disoriented.  She was evaluated in the emergency department.  Labs were within acceptable limits.  She has been given some Keflex  for possible UTI.  No other treatments initiated.  Shoulder is doing well.  She is not complaining of pain.  Review of Systems: No fevers or chills No numbness or tingling No Chest Pain No shortness of breath   Objective: There were no vitals taken for this visit.  Physical Exam:  Alert and oriented, no acute distress  Surgical incision is healing well, no surrounding erythema or drainage Sensation intact in the axillary nerve distribution Active motion intact in the hand 2+ radial pulse Sensation intact throughout the hand Tolerates gentle range of motion of the shoulder - passive forward flexion to 90, passive abduction to 90   IMAGING: I personally ordered and reviewed the following images:  XR of the Right shoulder obtained in clinic today and demonstrate Reverse Shoulder Arthroplasty  with implants in good position.  No evidence of acute injury or subsidence of implants.  Alignment remains unchanged compared to  immediate postop XR.  Impression: Right shoulder arthroplasty in good position   Jamie DELENA Horde, MD 05/19/2024 1:42 PM

## 2024-05-19 NOTE — ED Provider Notes (Signed)
 [place] EMERGENCY DEPARTMENT AT [facility] Provider Note   CSN: [id number] Arrival date & time: 05/19/24  1001     Patient presents with: Jamie Harding is a 68 y.o. female.   Pt with c/o fall st home. Pt indicates not sure what caused her to fall, and not sure if loc. Indicates family assisted her to cough/chair.  Denies other recent fall or syncope. ?hit head. No severe headaches. No neck or back pain. S/p right shoulder surgery 6/19, was supposed to see orthopedist today for same, no new or worsening pain. New new extremity swelllingNo problems w incision. No fevers. Denies current or recent chest pain or discomfort. No sob or unusual doe or fatigue. No abd pain or nvd. No recent blood loss, rectal bleeding or melena. No dysuria or gu c/o. No focal extremity pain or swelling. Is on plavix . Pt questions whether possible stroke - pt indicates two prior strokes with no specific neurologic symptoms. Denies any change in speech or vision. No new numbness/weakness. No dizziness.   The history is provided by the patient, medical records and the EMS personnel.  Fall Pertinent negatives include no chest pain, no abdominal pain, no headaches and no shortness of breath.       Prior to Admission medications   Medication Sig Start Date End Date Taking? Authorizing Provider  acetaminophen  (TYLENOL ) 500 MG tablet Take 1,000 mg by mouth every 6 (six) hours as needed for mild pain (pain score 1-3).    [provider]  albuterol  (VENTOLIN  HFA) 108 (90 Base) MCG/ACT inhaler INHALE 2 PUFFS INTO THE LUNGS EVERY 4 (FOUR) HOURS AS NEEDED FOR WHEEZING. 11/08/22   Alphonsa Glendia DELENA, MD  Blood Glucose Monitoring Suppl (ONETOUCH VERIO FLEX SYSTEM) w/Device KIT USE AS DIRECTED TEST BLOOD SUGAR 4 TIMES DAILY. 01/31/23   Alphonsa Glendia DELENA, MD  busPIRone  (BUSPAR ) 10 MG tablet Take 1 tablet BID PRN 05/15/24   Alphonsa Glendia DELENA, MD  clopidogrel  (PLAVIX ) 75 MG tablet Take 1 tablet (75 mg total)  by mouth daily with breakfast. 05/15/24   Alphonsa Glendia DELENA, MD  dicyclomine  (BENTYL ) 10 MG capsule Take 1 capsule (10 mg total) by mouth 3 (three) times daily as needed (for abdominal cramping or diarrhea). 09/03/22   Rudy Josette RAMAN, PA-C  HYDROcodone -acetaminophen  [facility]) 10-325 MG tablet 1 qid prn pain 05/15/24   Alphonsa Glendia DELENA, MD  HYDROcodone -acetaminophen  (NORCO) 10-325 MG tablet 1 4 times a day as needed for pain 05/15/24   Alphonsa Glendia DELENA, MD  HYDROcodone -acetaminophen  (NORCO) 10-325 MG tablet 1 taken 4 times a day as needed for pain 05/15/24   Alphonsa Glendia DELENA, MD  metFORMIN  (GLUCOPHAGE ) 1000 MG tablet TAKE ONE TABLET BY MOUTH TWO TIMES A DAY. 03/13/24   Alphonsa Glendia DELENA, MD  Multiple Vitamin (MULTIVITAMIN WITH MINERALS) TABS Take 1 tablet by mouth daily.    [provider]  ondansetron  (ZOFRAN ) 8 MG tablet TAKE ONE TABLET BY MOUTH EVERY 8 HOURS AS NEEDED FOR NAUSEA/VOMITING. Patient taking differently: Take 8 mg by mouth every 8 (eight) hours as needed for nausea or vomiting. Take one tablet by mouth every 8 hours as needed for nausea/vomiting. 02/28/24   Rudy Josette RAMAN, PA-C  ONETOUCH VERIO test strip USE AS DIRECTED TO TEST BLOOD GLUCOSE 4 TIMES DAILY. 12/19/23   Alphonsa Glendia DELENA, MD  pantoprazole  (PROTONIX ) 40 MG tablet TAKE (1) TABLET BY MOUTH TWICE A DAY BEFORE A MEAL. 05/15/24   Alphonsa Glendia DELENA, MD  pregabalin  (LYRICA ) 25 MG capsule Take 1 capsule (25 mg total) by mouth 2 (two) times daily. 05/15/24   Alphonsa Glendia LABOR, MD  promethazine  (PHENERGAN ) 25 MG tablet Take 1 tablet (25 mg total) by mouth every 8 (eight) hours as needed. for nausea 05/15/24   Alphonsa Glendia LABOR, MD  rosuvastatin  (CRESTOR ) 20 MG tablet Take 1 tablet (20 mg total) by mouth daily. 05/15/24   Alphonsa Glendia LABOR, MD  venlafaxine  (EFFEXOR ) 75 MG tablet Take 1-2 tablets (75-150 mg total) by mouth 2 (two) times daily with a meal. 150 mg am, 75 mg pm 05/15/24   Alphonsa Glendia LABOR, MD    Allergies: Augmentin  [amoxicillin -pot  clavulanate], Byetta 10 mcg pen [exenatide], Naproxen, Pollen extract, Azithromycin, Erythromycin, Invokana  [canagliflozin ], Morphine, and Sulfonamide derivatives    Review of Systems  Constitutional:  Negative for chills, diaphoresis and fever.  HENT:  Negative for sore throat and trouble swallowing.   Eyes:  Negative for redness and visual disturbance.  Respiratory:  Negative for cough and shortness of breath.   Cardiovascular:  Negative for chest pain, palpitations and leg swelling.  Gastrointestinal:  Negative for abdominal pain, blood in stool, diarrhea and vomiting.  Genitourinary:  Negative for dysuria, flank pain and vaginal bleeding.  Musculoskeletal:  Negative for back pain and neck pain.  Skin:  Negative for wound.  Neurological:  Negative for speech difficulty, weakness, numbness and headaches.  Psychiatric/Behavioral:  Negative for confusion.     Updated Vital Signs BP (!) 150/78   Pulse 79   Temp 98 F (36.7 C) (Oral)   Resp 16   Ht 1.575 m (5' 2)   Wt 78.5 kg   SpO2 95%   BMI 31.64 kg/m   Physical Exam Vitals and nursing note reviewed.  Constitutional:      Appearance: Normal appearance. She is well-developed.  HENT:     Head: Atraumatic.     Nose: Nose normal.     Mouth/Throat:     Mouth: Mucous membranes are moist.   Eyes:     General: No scleral icterus.    Extraocular Movements: Extraocular movements intact.     Conjunctiva/sclera: Conjunctivae normal.     Pupils: Pupils are equal, round, and reactive to light.   Neck:     Vascular: No carotid bruit.     Trachea: No tracheal deviation.     Comments: Trachea midline, thyroid  not grossly enlarged or tender. No bruits.  Cardiovascular:     Rate and Rhythm: Normal rate and regular rhythm.     Pulses: Normal pulses.     Heart sounds: Normal heart sounds. No murmur heard.    No friction rub. No gallop.  Pulmonary:     Effort: Pulmonary effort is normal. No respiratory distress.     Breath sounds:  Normal breath sounds.  Chest:     Chest wall: No tenderness.  Abdominal:     General: Bowel sounds are normal. There is no distension.     Palpations: Abdomen is soft. There is no mass.     Tenderness: There is no abdominal tenderness. There is no guarding.  Genitourinary:    Comments: No cva tenderness.   Musculoskeletal:        General: No swelling or tenderness.     Cervical back: Normal range of motion and neck supple. No rigidity. No muscular tenderness.     Comments: CTLS spine, non tender, aligned, no step off. Right shoulder dressing intact, no erythema, no purulent drainage, or other  sign of infection to area, good passive rom without pain.  Good rom bil extremities without pain or focal bony tenderness, distal pulses palp bil.    Skin:    General: Skin is warm and dry.     Findings: No rash.   Neurological:     Mental Status: She is alert.     Comments: Alert, speech normal. GCS 15. Motor/sens  grossly intact bil. Stre 5/5. Sens intact.   Psychiatric:        Mood and Affect: Mood normal.     (all labs ordered are listed, but only abnormal results are displayed) Results for orders placed or performed during the hospital encounter of 05/19/24  CBG monitoring, ED   Collection Time: 05/19/24 10:11 AM  Result Value Ref Range   Glucose-Capillary 149 (H) 70 - 99 mg/dL  Protime-INR   Collection Time: 05/19/24 10:21 AM  Result Value Ref Range   Prothrombin Time 13.9 11.4 - 15.2 seconds   INR 1.0 0.8 - 1.2  APTT   Collection Time: 05/19/24 10:21 AM  Result Value Ref Range   aPTT 27 24 - 36 seconds  CBC   Collection Time: 05/19/24 10:21 AM  Result Value Ref Range   WBC 8.2 4.0 - 10.5 K/uL   RBC 3.81 (L) 3.87 - 5.11 MIL/uL   Hemoglobin 11.6 (L) 12.0 - 15.0 g/dL   HCT 65.0 (L) 63.9 - 53.9 %   MCV 91.6 80.0 - 100.0 fL   MCH 30.4 26.0 - 34.0 pg   MCHC 33.2 30.0 - 36.0 g/dL   RDW 87.5 88.4 - 84.4 %   Platelets 398 150 - 400 K/uL   nRBC 0.0 0.0 - 0.2 %  Differential    Collection Time: 05/19/24 10:21 AM  Result Value Ref Range   Neutrophils Relative % 64 %   Neutro Abs 5.2 1.7 - 7.7 K/uL   Lymphocytes Relative 26 %   Lymphs Abs 2.1 0.7 - 4.0 K/uL   Monocytes Relative 6 %   Monocytes Absolute 0.5 0.1 - 1.0 K/uL   Eosinophils Relative 3 %   Eosinophils Absolute 0.3 0.0 - 0.5 K/uL   Basophils Relative 1 %   Basophils Absolute 0.0 0.0 - 0.1 K/uL   Immature Granulocytes 0 %   Abs Immature Granulocytes 0.03 0.00 - 0.07 K/uL  Comprehensive metabolic panel   Collection Time: 05/19/24 10:21 AM  Result Value Ref Range   Sodium 141 135 - 145 mmol/L   Potassium 3.7 3.5 - 5.1 mmol/L   Chloride 105 98 - 111 mmol/L   CO2 27 22 - 32 mmol/L   Glucose, Bld 154 (H) 70 - 99 mg/dL   BUN 9 8 - 23 mg/dL   Creatinine, Ser 9.18 0.44 - 1.00 mg/dL   Calcium  8.6 (L) 8.9 - 10.3 mg/dL   Total Protein 6.7 6.5 - 8.1 g/dL   Albumin 3.2 (L) 3.5 - 5.0 g/dL   AST 14 (L) 15 - 41 U/L   ALT 12 0 - 44 U/L   Alkaline Phosphatase 62 38 - 126 U/L   Total Bilirubin 0.4 0.0 - 1.2 mg/dL   GFR, Estimated >39 >39 mL/min   Anion gap 9 5 - 15  Ethanol   Collection Time: 05/19/24 10:21 AM  Result Value Ref Range   Alcohol, Ethyl (B) <15 <15 mg/dL  Urinalysis, Routine w reflex microscopic -Urine, Clean Catch   Collection Time: 05/19/24 11:30 AM  Result Value Ref Range   Color, Urine YELLOW YELLOW  APPearance HAZY (A) CLEAR   Specific Gravity, Urine 1.013 1.005 - 1.030   pH 5.0 5.0 - 8.0   Glucose, UA NEGATIVE NEGATIVE mg/dL   Hgb urine dipstick NEGATIVE NEGATIVE   Bilirubin Urine NEGATIVE NEGATIVE   Ketones, ur NEGATIVE NEGATIVE mg/dL   Protein, ur NEGATIVE NEGATIVE mg/dL   Nitrite POSITIVE (A) NEGATIVE   Leukocytes,Ua SMALL (A) NEGATIVE   RBC / HPF 0-5 0 - 5 RBC/hpf   WBC, UA 6-10 0 - 5 WBC/hpf   Bacteria, UA RARE (A) NONE SEEN   Squamous Epithelial / HPF 0-5 0 - 5 /HPF   Mucus PRESENT    Hyaline Casts, UA PRESENT    CT Head Wo Contrast Result Date: 05/19/2024 CLINICAL  DATA:  Head trauma, fall, possible syncope. EXAM: CT HEAD WITHOUT CONTRAST TECHNIQUE: Contiguous axial images were obtained from the base of the skull through the vertex without intravenous contrast. RADIATION DOSE REDUCTION: This exam was performed according to the departmental dose-optimization program which includes automated exposure control, adjustment of the mA and/or kV according to patient size and/or use of iterative reconstruction technique. COMPARISON:  MRI head 03/17/2020. FINDINGS: Brain: No acute intracranial hemorrhage. No CT evidence of acute infarct. Nonspecific hypoattenuation in the periventricular and subcortical white matter favored to reflect chronic microvascular ischemic changes. Remote infarcts in the right corona radiata. Additional remote lacunar infarct in the left paramedian pons. No edema, mass effect, or midline shift. The basilar cisterns are patent. Ventricles: The ventricles are normal. Vascular: Atherosclerotic calcifications of the carotid siphons. No hyperdense vessel. Skull: No acute or aggressive finding. Orbits: Orbits are symmetric. Sinuses: The visualized paranasal sinuses are clear. Other: Mastoid air cells are clear. IMPRESSION: No CT evidence of acute intracranial abnormality. Remote infarcts in the right corona radiata and left para median pons. Chronic microvascular ischemic changes. Electronically Signed   By: Donnice Mania M.D.   On: 05/19/2024 11:54   CT SHOULDER RIGHT WO CONTRAST Result Date: 05/08/2024 EXAM DESCRIPTION: CT SHOULDER RIGHT WO CONTRAST CLINICAL HISTORY: 67 years, Female, Preop COMPARISON: None. TECHNIQUE: CT shoulder without contrast. Multiplanar information. FINDINGS: The visualized right lung is unremarkable. No fracture or dislocation. No erosions. Mild calcific tendinopathy to the infraspinatus insertion. Mild to moderate acromioclavicular and milder glenohumeral osteoarthritis. Limited evaluation of the soft tissue structures are unremarkable.  IMPRESSION: Mild-to-moderate acromioclavicular and milder glenohumeral osteoarthritis. Mild calcific tendinopathy of the infraspinatus insertion. This exam was performed according to our departmental dose-optimization program, which includes automated exposure control, adjustment of the mA and/or kV according to patient size and/or use of iterative reconstruction technique. Electronically signed by: Reyes Frees MD 05/08/2024 11:41 PM EDT RP Workstation: [id number]   DG Shoulder Right Port Result Date: 05/07/2024 CLINICAL DATA:  Postop right shoulder EXAM: RIGHT SHOULDER - 1 VIEW COMPARISON:  Right shoulder radiograph dated 02/10/2024 FINDINGS: Postsurgical changes of right shoulder reversed total arthroplasty. Hardware appears intact and well seated. No acute fracture or dislocation. Overlying subcutaneous soft tissue emphysema. IMPRESSION: Postsurgical changes of right shoulder reversed total arthroplasty. Electronically Signed   By: Limin  Xu M.D.   On: 05/07/2024 11:09     EKG: EKG Interpretation Date/Time:  Tuesday May 19 2024 12:42:27 EDT Ventricular Rate:  80 PR Interval:    QRS Duration:  94 QT Interval:  416 QTC Calculation: 480 R Axis:   11  Text Interpretation: Sinus rhythm No significant change since last tracing Confirmed by Bernard Drivers ([id number]) on 05/19/2024 12:44:58 PM  Radiology: CT Head Wo Contrast Result Date:  05/19/2024 CLINICAL DATA:  Head trauma, fall, possible syncope. EXAM: CT HEAD WITHOUT CONTRAST TECHNIQUE: Contiguous axial images were obtained from the base of the skull through the vertex without intravenous contrast. RADIATION DOSE REDUCTION: This exam was performed according to the departmental dose-optimization program which includes automated exposure control, adjustment of the mA and/or kV according to patient size and/or use of iterative reconstruction technique. COMPARISON:  MRI head 03/17/2020. FINDINGS: Brain: No acute intracranial hemorrhage. No CT evidence  of acute infarct. Nonspecific hypoattenuation in the periventricular and subcortical white matter favored to reflect chronic microvascular ischemic changes. Remote infarcts in the right corona radiata. Additional remote lacunar infarct in the left paramedian pons. No edema, mass effect, or midline shift. The basilar cisterns are patent. Ventricles: The ventricles are normal. Vascular: Atherosclerotic calcifications of the carotid siphons. No hyperdense vessel. Skull: No acute or aggressive finding. Orbits: Orbits are symmetric. Sinuses: The visualized paranasal sinuses are clear. Other: Mastoid air cells are clear. IMPRESSION: No CT evidence of acute intracranial abnormality. Remote infarcts in the right corona radiata and left para median pons. Chronic microvascular ischemic changes. Electronically Signed   By: Donnice Mania M.D.   On: 05/19/2024 11:54     Procedures   Medications Ordered in the ED  cephALEXin  (KEFLEX ) capsule 500 mg (500 mg Oral Given 05/19/24 1243)                                    Medical Decision Making Problems Addressed: Accidental fall, initial encounter: acute illness or injury with systemic symptoms that poses a threat to life or bodily functions Syncope, unspecified syncope type: acute illness or injury with systemic symptoms that poses a threat to life or bodily functions Urine white blood cells increased: acute illness or injury  Amount and/or Complexity of Data Reviewed Independent Historian: EMS    Details: hx External Data Reviewed: notes. Labs: ordered. Decision-making details documented in ED Course. Radiology: ordered and independent interpretation performed. Decision-making details documented in ED Course. ECG/medicine tests: ordered and independent interpretation performed. Decision-making details documented in ED Course.  Risk Prescription drug management. Decision regarding hospitalization.   Iv ns. Continuous pulse ox and cardiac monitoring.  Labs ordered/sent. Imaging ordered.   Differential diagnosis includes syncope, anemia, dehydration, etc. Dispo decision including potential need for admission considered - will get labs and imaging and reassess.   Reviewed nursing notes and prior charts for additional history. External reports reviewed. Additional history from: EMS.   Cardiac monitor: sinus rhythm, rate 80.  Labs reviewed/interpreted by me - wbc normal. Hct 35. Chem unremarkable. UA pending.   Additional labs reviewed/interpreted by me - ua w nitrite +, 6-10 wbc. Keflex  po.   CT reviewed/interpreted by me - no hem.   Po fluids/food. Ambulate in hall.  Patient ambulates in hall w steady gait. No faintness. No dizziness or ataxia. No focal neuro deficit noted on exma. No faintness. Pt indicates feels c/w baseline.  Pt currently appears stable for ED d/c.   Rec close pcp and  cardiology f/u - cardiology referral made re possible syncope.  Return precautions provided.       Final diagnoses:  Accidental fall, initial encounter  Syncope, unspecified syncope type  Urine white blood cells increased    ED Discharge Orders     None          Bernard Drivers, MD 05/19/24 1247

## 2024-05-19 NOTE — Discharge Instructions (Addendum)
 It was our pleasure to provide your ER care today - we hope that you feel better.  Drink plenty of fluids/stay well hydrated.   Your lab work shows a possible uti -  take keflex  (antibiotic) as prescribed.  Follow up closely with your orthopedist as planned - call to reschedule appointment.   For possible fainting episode, follow up closely with cardiologist - we made referral, and they should be contacting you in the next 1-2 days to arrange appointment.   Return to ER right away if worse, new symptoms, fevers, chest pain, trouble breathing, new/severe pain, weak/fainting, new numbness/weakness, change in speech or vision, or other concern.

## 2024-05-20 LAB — I-STAT CHEM 8, ED
BUN: 8 mg/dL (ref 8–23)
Calcium, Ion: 1.15 mmol/L (ref 1.15–1.40)
Chloride: 102 mmol/L (ref 98–111)
Creatinine, Ser: 0.8 mg/dL (ref 0.44–1.00)
Glucose, Bld: 154 mg/dL — ABNORMAL HIGH (ref 70–99)
HCT: 36 % (ref 36.0–46.0)
Hemoglobin: 12.2 g/dL (ref 12.0–15.0)
Potassium: 3.8 mmol/L (ref 3.5–5.1)
Sodium: 141 mmol/L (ref 135–145)
TCO2: 26 mmol/L (ref 22–32)

## 2024-05-24 ENCOUNTER — Encounter: Payer: Self-pay | Admitting: Family Medicine

## 2024-05-25 ENCOUNTER — Encounter: Payer: Self-pay | Admitting: Orthopedic Surgery

## 2024-05-25 NOTE — Telephone Encounter (Signed)
 Nurses I appreciate the update from [place] I reviewed over her EKG CT scan and lab work Overall I do not find any alarming results Certainly the passing out can be scary I would like for her to have an appointment with myself or Elveria within the next couple weeks When we see her we will be ordering telemetry as well as following up on her blood pressures You may forward a copy of this message to [facility] to let her know that I have reviewed over all of this, please set her up for an appointment as stated above thank you  Most likely Imo passed out related to a drop in blood pressure sometimes created by recent surgery, medications etc.  Thanks-Dr. Glendia

## 2024-05-27 ENCOUNTER — Other Ambulatory Visit: Payer: Self-pay | Admitting: Family Medicine

## 2024-05-29 ENCOUNTER — Ambulatory Visit (HOSPITAL_COMMUNITY): Attending: Orthopedic Surgery | Admitting: Occupational Therapy

## 2024-05-29 ENCOUNTER — Encounter (HOSPITAL_COMMUNITY): Payer: Self-pay | Admitting: Occupational Therapy

## 2024-05-29 DIAGNOSIS — M25611 Stiffness of right shoulder, not elsewhere classified: Secondary | ICD-10-CM | POA: Diagnosis not present

## 2024-05-29 DIAGNOSIS — R29898 Other symptoms and signs involving the musculoskeletal system: Secondary | ICD-10-CM | POA: Diagnosis not present

## 2024-05-29 DIAGNOSIS — M25511 Pain in right shoulder: Secondary | ICD-10-CM | POA: Insufficient documentation

## 2024-05-29 DIAGNOSIS — M75101 Unspecified rotator cuff tear or rupture of right shoulder, not specified as traumatic: Secondary | ICD-10-CM | POA: Insufficient documentation

## 2024-05-29 NOTE — Patient Instructions (Signed)

## 2024-05-29 NOTE — Therapy (Signed)
 OUTPATIENT OCCUPATIONAL THERAPY ORTHO EVALUATION  Patient Name: Jamie Harding MRN: [id number] DOB:[birth year], 68 y.o., female Today's Date: 05/29/2024   END OF SESSION:  OT End of Session - 05/29/24 1519     Visit Number 1    Number of Visits 12    Date for OT Re-Evaluation 07/10/24    Authorization Type [employer]    OT Start Time 1430    OT Stop Time 1501    OT Time Calculation (min) 31 min    Activity Tolerance Patient tolerated treatment well    Behavior During Therapy [facility] for tasks assessed/performed          Past Medical History:  Diagnosis Date   Anxiety    Asthma    Depression    Essential hypertension    Fatty liver    Gastritis    Gastroparesis    GERD (gastroesophageal reflux disease)    Hiatal hernia    History of cardiac catheterization    Minimal coronary atherosclerosis February 2016   History of kidney stones    History of stroke 2008   HLD (hyperlipidemia)    Neuropathy    Obesity    Pancreatitis    Rheumatoid arthritis(714.0)    Stroke (HCC) 2008   Stroke (HCC) 01/2020   Tibialis tendinitis    Type 2 diabetes mellitus (HCC)    Past Surgical History:  Procedure Laterality Date   ANKLE SURGERY     remove extra bone-left   BIOPSY  04/25/2021   Procedure: BIOPSY;  Surgeon: Cindie Carlin POUR, DO;  [facility];  Service: Endoscopy;;   CARPAL TUNNEL RELEASE     right side   CARPAL TUNNEL RELEASE Left 02/06/2013   Procedure: CARPAL TUNNEL RELEASE ;  Surgeon: Taft FORBES Minerva, MD;  [facility]: [facility];  Service: Orthopedics;  Laterality: Left;  procedure end 1135   CARPAL TUNNEL RELEASE Right 09/14/2015   Procedure: RIGHT CARPAL TUNNEL RELEASE;  Surgeon: Taft FORBES Minerva, MD;  [facility];  Service: Orthopedics;  Laterality: Right;   CARPAL TUNNEL RELEASE Left 09/28/2015   Procedure: LEFT CARPAL TUNNEL RELEASE;  Surgeon: Taft FORBES Minerva, MD;  [facility];  Service: Orthopedics;  Laterality: Left;  procedure  1   CESAREAN SECTION     CHOLECYSTECTOMY     COLONOSCOPY WITH PROPOFOL   09/16/2012   DOQ:Qpcz sessile polyps ranging between 3-23mm in size were found in the sigmoid colon and rectum (hyperplastic)/Mild diverticulosis was noted throughout the entire examined colon/Small internal hemorrhoids. Repeat in 10 years.   COLONOSCOPY WITH PROPOFOL  N/A 09/24/2022   Procedure: COLONOSCOPY WITH PROPOFOL ;  Surgeon: Cindie Carlin POUR, DO;  [facility];  Service: Endoscopy;  Laterality: N/A;  12:15pm, asa 3   DILATATION & CURETTAGE/HYSTEROSCOPY WITH MYOSURE N/A 08/05/2019   Procedure: DILATATION & CURETTAGE/HYSTEROSCOPY WITH MYOSURE;  Surgeon: Nicholaus Burnard HERO, MD;  Location: [facility];  Service: Gynecology;  Laterality: N/A;  myosure rep will be here.  Confirmed on 07/30/19 CS   DILATION AND CURETTAGE OF UTERUS     multiple   ESOPHAGOGASTRODUODENOSCOPY  07/29/2009   Mild gastritis, benign path   ESOPHAGOGASTRODUODENOSCOPY (EGD) WITH PROPOFOL   09/16/2012   SLF:Non-erosive gastritis (inflammation) was found; multiple bx/The duodenal mucosa showed no abnormalities in the ampulla and bulb and second portion of the duodenum/NAUSEA/VOMITING MOST LIKELY DUE TO GERD/GASTRITIS   ESOPHAGOGASTRODUODENOSCOPY (EGD) WITH PROPOFOL  N/A 04/25/2021   Surgeon: Cindie Carlin POUR, DO; Gastritis biopsied, normal examined duodenum biopsied.  Pathology revealed focal chronic gastritis, negative for H. Pylori, benign small bowel mucosa.   FINGER SURGERY Left    left little finger-otif of finger   Gastric Emptying  07/27/2009   The amount of activity in the stomach at 120 minutes was 13% which is in the normal range   LASER ABLATION OF THE CERVIX     LEFT AND RIGHT HEART CATHETERIZATION WITH CORONARY ANGIOGRAM N/A 01/03/2015   Procedure: LEFT AND RIGHT HEART CATHETERIZATION WITH CORONARY ANGIOGRAM;  Surgeon: Ozell JONETTA Fell, MD;  Location: [facility];  Service: Cardiovascular;  Laterality: N/A;    LITHOTRIPSY     POLYPECTOMY  09/16/2012   Procedure: POLYPECTOMY;  Surgeon: Margo LITTIE Haddock, MD;  [facility]: [facility];  Service: Endoscopy;  Laterality: N/A;   POLYPECTOMY  09/24/2022   Procedure: POLYPECTOMY;  Surgeon: Cindie Carlin POUR, DO;  [facility];  Service: Endoscopy;;   REVERSE SHOULDER ARTHROPLASTY Right 05/07/2024   Procedure: ARTHROPLASTY, SHOULDER, TOTAL, REVERSE;  Surgeon: Onesimo Oneil LABOR, MD;  [facility];  Service: Orthopedics;  Laterality: Right;   SAVORY DILATION  09/16/2012   Procedure: SAVORY DILATION;  Surgeon: Margo LITTIE Haddock, MD;  [facility];  Service: Endoscopy;  Laterality: N/A;  16 fr dilation   TRIGGER FINGER RELEASE Left 02/06/2013   Procedure: RELEASE TRIGGER FINGER LEFT LONG FINGER/A-1 PULLEY;  Surgeon: Taft FORBES Minerva, MD;  [facility];  Service: Orthopedics;  Laterality: Left;  procedure began 1136   TRIGGER FINGER RELEASE Right 09/14/2015   Procedure: RIGHT LONG FINGER TRIGGER RELEASE;  Surgeon: Taft FORBES Minerva, MD;  [facility];  Service: Orthopedics;  Laterality: Right;   TRIGGER FINGER RELEASE Left 09/28/2015   Procedure: LEFT RING TRIGGER FINGER RELEASE;  Surgeon: Taft FORBES Minerva, MD;  [facility];  Service: Orthopedics;  Laterality: Left;  left ring finger   TRIGGER FINGER RELEASE Right 08/07/2023   Procedure: RELEASE TRIGGER FINGER/A-1 PULLEY Right thumb;  Surgeon: Minerva Taft FORBES, MD;  [facility]: [facility];  Service: Orthopedics;  Laterality: Right;   TUBAL LIGATION     Patient Active Problem List   Diagnosis Date Noted   Right rotator cuff tear 05/07/2024   Multiple wounds of skin 09/25/2023   Cellulitis of upper back excluding scapular region 09/25/2023   Trigger finger of right thumb 08/07/2023   Constipation 11/28/2022   Alternating constipation and diarrhea 09/03/2022   Dizziness 04/25/2022   History of colonic polyps 03/14/2022   Long-term current use of opiate analgesic 07/07/2021   Gastroesophageal  reflux disease 03/27/2021   Left foot drop 03/17/2020   Former smoker    Chronic cystitis with hematuria 01/22/2020   Nephrolithiasis 01/22/2020   Hyperlipidemia associated with type 2 diabetes mellitus (HCC) 12/08/2019   Morbid obesity due to excess calories (HCC) 09/05/2015   Chronic pain syndrome 03/30/2015   History of CVA (cerebrovascular accident) without residual deficits 12/23/2014   Dyspnea on exertion 11/29/2014   Lumbar pain with radiation down left leg 02/16/2013   Gastroparesis 07/19/2009   Type 2 diabetes mellitus with vascular disease (HCC) 07/15/2009   ANXIETY 07/15/2009   Depression 07/15/2009   Essential hypertension 07/15/2009   HIATAL HERNIA 07/15/2009   Fatty liver 07/15/2009    PCP: Alphonsa Hamilton, MD REFERRING PROVIDER: Onesimo Oneil, MD  ONSET DATE: 05/07/24  REFERRING DIAG: R Reverse Shoulder Arthroplasty  THERAPY DIAG:  Acute pain of right shoulder - Plan: Ot plan of care cert/re-cert  Shoulder stiffness, right - Plan: Ot plan of  care cert/re-cert  Other symptoms and signs involving the musculoskeletal system - Plan: Ot plan of care cert/re-cert  Rationale for Evaluation and Treatment: Rehabilitation  SUBJECTIVE:   SUBJECTIVE STATEMENT: If I do something it gets sore quick Pt accompanied by: self  PERTINENT HISTORY: Pt s/p R Reverse Shoulder Arthroplasty on 05/07/24  PRECAUTIONS: Shoulder - See protocol  WEIGHT BEARING RESTRICTIONS: Yes >1#  PAIN:  Are you having pain? No  FALLS: Has patient fallen in last 6 months? Yes. Number of falls 5  PLOF: Independent  PATIENT GOALS: To improve mobility  NEXT MD VISIT: 06/16/24  OBJECTIVE:   HAND DOMINANCE: Right  ADLs: Overall ADLs: Pt having most problems with lifting and carrying items for cooking and cleaning.   FUNCTIONAL OUTCOME MEASURES: Upper Extremity Functional Scale (UEFS): 44/80 - 55%  UPPER EXTREMITY ROM:       Assessed in supine, er/IR adducted  Passive ROM Right eval   Shoulder flexion 151  Shoulder abduction 153  Shoulder internal rotation 90  Shoulder external rotation 41  (Blank rows = not tested)    UPPER EXTREMITY MMT:     Assessed in seated, er/IR adducted  MMT Right eval  Shoulder flexion   Shoulder abduction   Shoulder internal rotation   Shoulder external rotation   (Blank rows = not tested)   SENSATION: WFL  EDEMA: No swelling noted  OBSERVATIONS: Moderate fascial restrictions along biceps and trapezius.    TODAY'S TREATMENT:                                                                                                                              DATE:  05/29/24 -Table Slides: flexion, abduction, er, x10 -Pendulums: 2x30 -Elbow and Wrist ROM Review    PATIENT EDUCATION: Education details: Table Slides and Pendulums Person educated: Patient Education method: Explanation, Demonstration, and Handouts Education comprehension: verbalized understanding and returned demonstration  HOME EXERCISE PROGRAM: 7/11: Table Slides and Pendulums  GOALS: Goals reviewed with patient? Yes   SHORT TERM GOALS: Target date: 06/18/24  Pt will be provided with and educated on HEP to improve mobility in RUE required for use during ADL completion.   Goal status: INITIAL  3.  Pt will increase RUE strength to 4-/5 to improve ability to reach for items at waist to chest height during bathing and grooming tasks.   Goal status: INITIAL   LONG TERM GOALS: Target date: 07/10/24  Pt will decrease pain in RUE to 3/10 or less to improve ability to sleep for 2+ consecutive hours without waking due to pain.   Goal status: INITIAL  2.  Pt will decrease RUE fascial restrictions to min amounts or less to improve mobility required for functional reaching tasks.   Goal status: INITIAL  3.  Pt will increase RUE A/ROM by 10 degrees to improve ability to use RUE when reaching overhead or behind back during dressing and bathing tasks.   Goal  status: INITIAL  4.  Pt will increase RUE strength to 5/5 or greater to improve ability to use RUE when lifting or carrying items during meal preparation/housework/yardwork tasks.   Goal status: INITIAL  5.  Pt will return to highest level of function using RUE as dominant during functional task completion.   Goal status: INITIAL   ASSESSMENT:  CLINICAL IMPRESSION: Patient is a 68 y.o. female who was seen today for occupational therapy evaluation for R Reverse Shoulder Arthroplasty. Pt presents with increased pain and fascial restrictions, decreased ROM, strength, and functional use of the RUE.   PERFORMANCE DEFICITS: in functional skills including in functional skills including ADLs, IADLs, coordination, tone, ROM, strength, pain, fascial restrictions, muscle spasms, and UE functional use.  IMPAIRMENTS: are limiting patient from ADLs, IADLs, rest and sleep, work, leisure, and social participation.   COMORBIDITIES: has no other co-morbidities that affects occupational performance. Patient will benefit from skilled OT to address above impairments and improve overall function.  MODIFICATION OR ASSISTANCE TO COMPLETE EVALUATION: No modification of tasks or assist necessary to complete an evaluation.  OT OCCUPATIONAL PROFILE AND HISTORY: Problem focused assessment: Including review of records relating to presenting problem.  CLINICAL DECISION MAKING: LOW - limited treatment options, no task modification necessary  REHAB POTENTIAL: Excellent  EVALUATION COMPLEXITY: Low      PLAN:  OT FREQUENCY: 2x/week  OT DURATION: 6 weeks  PLANNED INTERVENTIONS: 97168 OT Re-evaluation, 97535 self care/ADL training, 02889 therapeutic exercise, 97530 therapeutic activity, 97112 neuromuscular re-education, 97140 manual therapy, 97010 moist heat, 97032 electrical stimulation (manual), passive range of motion, functional mobility training, energy conservation, coping strategies training,  patient/family education, and DME and/or AE instructions  RECOMMENDED OTHER SERVICES: N/A  CONSULTED AND AGREED WITH PLAN OF CARE: Patient  PLAN FOR NEXT SESSION: Manual Therapy, P/ROM, Mercer Orts, Thumb Tacs  Valentin Nightingale, OTR/L [facility] [phone] Mirela Parsley Jillyn Nightingale, OT 05/29/2024, 3:20 PM

## 2024-06-02 ENCOUNTER — Ambulatory Visit (HOSPITAL_COMMUNITY): Admitting: Occupational Therapy

## 2024-06-02 ENCOUNTER — Encounter (HOSPITAL_COMMUNITY): Payer: Self-pay | Admitting: Occupational Therapy

## 2024-06-02 DIAGNOSIS — R29898 Other symptoms and signs involving the musculoskeletal system: Secondary | ICD-10-CM

## 2024-06-02 DIAGNOSIS — M25511 Pain in right shoulder: Secondary | ICD-10-CM

## 2024-06-02 DIAGNOSIS — M25611 Stiffness of right shoulder, not elsewhere classified: Secondary | ICD-10-CM

## 2024-06-02 NOTE — Patient Instructions (Signed)

## 2024-06-02 NOTE — Therapy (Unsigned)
 OUTPATIENT OCCUPATIONAL THERAPY ORTHO TREATMENT NOTE  Patient Name: Jamie Harding MRN: [id number] DOB:12/06/1955, 68 y.o., female Today's Date: 06/03/2024   END OF SESSION:  OT End of Session - 06/02/24 1537     Visit Number 2    Number of Visits 12    Date for OT Re-Evaluation 07/10/24    Authorization Type [employer]    OT Start Time 1452    OT Stop Time 1537    OT Time Calculation (min) 45 min    Activity Tolerance Patient tolerated treatment well    Behavior During Therapy [facility] for tasks assessed/performed           Past Medical History:  Diagnosis Date   Anxiety    Asthma    Depression    Essential hypertension    Fatty liver    Gastritis    Gastroparesis    GERD (gastroesophageal reflux disease)    Hiatal hernia    History of cardiac catheterization    Minimal coronary atherosclerosis February 2016   History of kidney stones    History of stroke 2008   HLD (hyperlipidemia)    Neuropathy    Obesity    Pancreatitis    Rheumatoid arthritis(714.0)    Stroke (HCC) 2008   Stroke (HCC) 01/2020   Tibialis tendinitis    Type 2 diabetes mellitus (HCC)    Past Surgical History:  Procedure Laterality Date   ANKLE SURGERY     remove extra bone-left   BIOPSY  04/25/2021   Procedure: BIOPSY;  Surgeon: Cindie Carlin POUR, DO;  [facility];  Service: Endoscopy;;   CARPAL TUNNEL RELEASE     right side   CARPAL TUNNEL RELEASE Left 02/06/2013   Procedure: CARPAL TUNNEL RELEASE ;  Surgeon: Taft FORBES Minerva, MD;  [facility];  Service: Orthopedics;  Laterality: Left;  procedure end 1135   CARPAL TUNNEL RELEASE Right 09/14/2015   Procedure: RIGHT CARPAL TUNNEL RELEASE;  Surgeon: Taft FORBES Minerva, MD;  [facility];  Service: Orthopedics;  Laterality: Right;   CARPAL TUNNEL RELEASE Left 09/28/2015   Procedure: LEFT CARPAL TUNNEL RELEASE;  Surgeon: Taft FORBES Minerva, MD;  [facility];  Service: Orthopedics;  Laterality: Left;   procedure 1   CESAREAN SECTION     CHOLECYSTECTOMY     COLONOSCOPY WITH PROPOFOL   09/16/2012   DOQ:Qpcz sessile polyps ranging between 3-29mm in size were found in the sigmoid colon and rectum (hyperplastic)/Mild diverticulosis was noted throughout the entire examined colon/Small internal hemorrhoids. Repeat in 10 years.   COLONOSCOPY WITH PROPOFOL  N/A 09/24/2022   Procedure: COLONOSCOPY WITH PROPOFOL ;  Surgeon: Cindie Carlin POUR, DO;  [facility];  Service: Endoscopy;  Laterality: N/A;  12:15pm, asa 3   DILATATION & CURETTAGE/HYSTEROSCOPY WITH MYOSURE N/A 08/05/2019   Procedure: DILATATION & CURETTAGE/HYSTEROSCOPY WITH MYOSURE;  Surgeon: Nicholaus Burnard HERO, MD;  Location: [facility];  Service: Gynecology;  Laterality: N/A;  myosure rep will be here.  Confirmed on 07/30/19 CS   DILATION AND CURETTAGE OF UTERUS     multiple   ESOPHAGOGASTRODUODENOSCOPY  07/29/2009   Mild gastritis, benign path   ESOPHAGOGASTRODUODENOSCOPY (EGD) WITH PROPOFOL   09/16/2012   SLF:Non-erosive gastritis (inflammation) was found; multiple bx/The duodenal mucosa showed no abnormalities in the ampulla and bulb and second portion of the duodenum/NAUSEA/VOMITING MOST LIKELY DUE TO GERD/GASTRITIS   ESOPHAGOGASTRODUODENOSCOPY (EGD) WITH PROPOFOL  N/A 04/25/2021   Surgeon: Cindie Carlin POUR, DO; Gastritis biopsied, normal examined duodenum  biopsied.  Pathology revealed focal chronic gastritis, negative for H. Pylori, benign small bowel mucosa.   FINGER SURGERY Left    left little finger-otif of finger   Gastric Emptying  07/27/2009   The amount of activity in the stomach at 120 minutes was 13% which is in the normal range   LASER ABLATION OF THE CERVIX     LEFT AND RIGHT HEART CATHETERIZATION WITH CORONARY ANGIOGRAM N/A 01/03/2015   Procedure: LEFT AND RIGHT HEART CATHETERIZATION WITH CORONARY ANGIOGRAM;  Surgeon: Ozell JONETTA Fell, MD;  Location: [facility] CATH [facility];  Service: Cardiovascular;  Laterality: N/A;    LITHOTRIPSY     POLYPECTOMY  09/16/2012   Procedure: POLYPECTOMY;  Surgeon: Margo LITTIE Haddock, MD;  [facility];  Service: Endoscopy;  Laterality: N/A;   POLYPECTOMY  09/24/2022   Procedure: POLYPECTOMY;  Surgeon: Cindie Carlin POUR, DO;  [facility];  Service: Endoscopy;;   REVERSE SHOULDER ARTHROPLASTY Right 05/07/2024   Procedure: ARTHROPLASTY, SHOULDER, TOTAL, REVERSE;  Surgeon: Onesimo Oneil LABOR, MD;  [facility];  Service: Orthopedics;  Laterality: Right;   SAVORY DILATION  09/16/2012   Procedure: SAVORY DILATION;  Surgeon: Margo LITTIE Haddock, MD;  [facility];  Service: Endoscopy;  Laterality: N/A;  16 fr dilation   TRIGGER FINGER RELEASE Left 02/06/2013   Procedure: RELEASE TRIGGER FINGER LEFT LONG FINGER/A-1 PULLEY;  Surgeon: Taft FORBES Minerva, MD;  [facility];  Service: Orthopedics;  Laterality: Left;  procedure began 1136   TRIGGER FINGER RELEASE Right 09/14/2015   Procedure: RIGHT LONG FINGER TRIGGER RELEASE;  Surgeon: Taft FORBES Minerva, MD;  [facility];  Service: Orthopedics;  Laterality: Right;   TRIGGER FINGER RELEASE Left 09/28/2015   Procedure: LEFT RING TRIGGER FINGER RELEASE;  Surgeon: Taft FORBES Minerva, MD;  [facility];  Service: Orthopedics;  Laterality: Left;  left ring finger   TRIGGER FINGER RELEASE Right 08/07/2023   Procedure: RELEASE TRIGGER FINGER/A-1 PULLEY Right thumb;  Surgeon: Minerva Taft FORBES, MD;  [facility]: [facility];  Service: Orthopedics;  Laterality: Right;   TUBAL LIGATION     Patient Active Problem List   Diagnosis Date Noted   Right rotator cuff tear 05/07/2024   Multiple wounds of skin 09/25/2023   Cellulitis of upper back excluding scapular region 09/25/2023   Trigger finger of right thumb 08/07/2023   Constipation 11/28/2022   Alternating constipation and diarrhea 09/03/2022   Dizziness 04/25/2022   History of colonic polyps 03/14/2022   Long-term current use of opiate analgesic 07/07/2021   Gastroesophageal  reflux disease 03/27/2021   Left foot drop 03/17/2020   Former smoker    Chronic cystitis with hematuria 01/22/2020   Nephrolithiasis 01/22/2020   Hyperlipidemia associated with type 2 diabetes mellitus (HCC) 12/08/2019   Morbid obesity due to excess calories (HCC) 09/05/2015   Chronic pain syndrome 03/30/2015   History of CVA (cerebrovascular accident) without residual deficits 12/23/2014   Dyspnea on exertion 11/29/2014   Lumbar pain with radiation down left leg 02/16/2013   Gastroparesis 07/19/2009   Type 2 diabetes mellitus with vascular disease (HCC) 07/15/2009   ANXIETY 07/15/2009   Depression 07/15/2009   Essential hypertension 07/15/2009   HIATAL HERNIA 07/15/2009   Fatty liver 07/15/2009    PCP: Alphonsa Hamilton, MD REFERRING PROVIDER: Onesimo Oneil, MD  ONSET DATE: 05/07/24  REFERRING DIAG: R Reverse Shoulder Arthroplasty  THERAPY DIAG:  Acute pain of right shoulder  Shoulder stiffness, right  Other symptoms and signs involving the musculoskeletal system  Rationale for Evaluation and Treatment: Rehabilitation  SUBJECTIVE:   SUBJECTIVE STATEMENT: I think it is doing really well. Pt accompanied by: self  PERTINENT HISTORY: Pt s/p R Reverse Shoulder Arthroplasty on 05/07/24  PRECAUTIONS: Shoulder - See protocol  WEIGHT BEARING RESTRICTIONS: Yes >1#  PAIN:  Are you having pain? No  FALLS: Has patient fallen in last 6 months? Yes. Number of falls 5  PLOF: Independent  PATIENT GOALS: To improve mobility  NEXT MD VISIT: 06/16/24  OBJECTIVE:   HAND DOMINANCE: Right  ADLs: Overall ADLs: Pt having most problems with lifting and carrying items for cooking and cleaning.   FUNCTIONAL OUTCOME MEASURES: Upper Extremity Functional Scale (UEFS): 44/80 - 55%  UPPER EXTREMITY ROM:       Assessed in supine, er/IR adducted  Passive ROM Right eval  Shoulder flexion 151  Shoulder abduction 153  Shoulder internal rotation 90  Shoulder external rotation 41   (Blank rows = not tested)    UPPER EXTREMITY MMT:     Assessed in seated, er/IR adducted  MMT Right eval  Shoulder flexion   Shoulder abduction   Shoulder internal rotation   Shoulder external rotation   (Blank rows = not tested)   SENSATION: WFL  EDEMA: No swelling noted  OBSERVATIONS: Moderate fascial restrictions along biceps and trapezius.    TODAY'S TREATMENT:                                                                                                                              DATE:   06/02/24 -Manual Therapy: myofascial release and trigger point applied to the biceps, triceps, subscapular region, and trapezius, in order to reduce pain and fascial restrictions, as well as improve ROM.  -P/ROM: supine, flexion, abduction, er/IR, x10 -AA/ROM: supine, flexion, abduction, protraction, horizontal abduction, er/IR, x10 -Low level wall wash: 1' -Thumb tacs: 1'  05/29/24 -Table Slides: flexion, abduction, er, x10 -Pendulums: 2x30 -Elbow and Wrist ROM Review    PATIENT EDUCATION: Education details: AA/ROM Person educated: Patient Education method: Programmer, multimedia, Demonstration, and Handouts Education comprehension: verbalized understanding and returned demonstration  HOME EXERCISE PROGRAM: 7/11: Table Slides and Pendulums 7/16: AA/ROM  GOALS: Goals reviewed with patient? Yes   SHORT TERM GOALS: Target date: 06/18/24  Pt will be provided with and educated on HEP to improve mobility in RUE required for use during ADL completion.   Goal status: INITIAL  3.  Pt will increase RUE strength to 4-/5 to improve ability to reach for items at waist to chest height during bathing and grooming tasks.   Goal status: INITIAL   LONG TERM GOALS: Target date: 07/10/24  Pt will decrease pain in RUE to 3/10 or less to improve ability to sleep for 2+ consecutive hours without waking due to pain.   Goal status: INITIAL  2.  Pt will decrease RUE fascial restrictions to  min amounts or less to improve mobility required for functional reaching tasks.   Goal status:  INITIAL  3.  Pt will increase RUE A/ROM by 10 degrees to improve ability to use RUE when reaching overhead or behind back during dressing and bathing tasks.   Goal status: INITIAL  4.  Pt will increase RUE strength to 5/5 or greater to improve ability to use RUE when lifting or carrying items during meal preparation/housework/yardwork tasks.   Goal status: INITIAL  5.  Pt will return to highest level of function using RUE as dominant during functional task completion.   Goal status: INITIAL   ASSESSMENT:  CLINICAL IMPRESSION: Pt presenting to her first OT treatment session, with minimal pain and good overall mobility. She is able to tolerate P/ROM to full range, as well as AA/ROM to 80% of full range. With all low level endurance activities, pt reporting no pain or discomfort, completing all with minimal need for rest breaks. Verbal and tactile cuing for positioning and technique.   PERFORMANCE DEFICITS: in functional skills including in functional skills including ADLs, IADLs, coordination, tone, ROM, strength, pain, fascial restrictions, muscle spasms, and UE functional use.   PLAN:  OT FREQUENCY: 2x/week  OT DURATION: 6 weeks  PLANNED INTERVENTIONS: 97168 OT Re-evaluation, 97535 self care/ADL training, 02889 therapeutic exercise, 97530 therapeutic activity, 97112 neuromuscular re-education, 97140 manual therapy, 97010 moist heat, 97032 electrical stimulation (manual), passive range of motion, functional mobility training, energy conservation, coping strategies training, patient/family education, and DME and/or AE instructions  RECOMMENDED OTHER SERVICES: N/A  CONSULTED AND AGREED WITH PLAN OF CARE: Patient  PLAN FOR NEXT SESSION: Manual Therapy, P/ROM, Mercer Orts, Thumb Tacs  Valentin Nightingale, OTR/L [facility] [phone] Valentin Jillyn Nightingale, OT 06/03/2024, 8:40  AM

## 2024-06-05 ENCOUNTER — Ambulatory Visit (HOSPITAL_COMMUNITY): Admitting: Occupational Therapy

## 2024-06-05 ENCOUNTER — Encounter (HOSPITAL_COMMUNITY): Payer: Self-pay | Admitting: Occupational Therapy

## 2024-06-05 DIAGNOSIS — M25511 Pain in right shoulder: Secondary | ICD-10-CM

## 2024-06-05 DIAGNOSIS — M25611 Stiffness of right shoulder, not elsewhere classified: Secondary | ICD-10-CM

## 2024-06-05 DIAGNOSIS — R29898 Other symptoms and signs involving the musculoskeletal system: Secondary | ICD-10-CM

## 2024-06-05 NOTE — Therapy (Signed)
 OUTPATIENT OCCUPATIONAL THERAPY ORTHO TREATMENT NOTE  Patient Name: Jamie Harding MRN: [id number] DOB:10/05/1956, 68 y.o., female Today's Date: 06/05/2024   END OF SESSION:   06/05/24 1528  OT Visits / Re-Eval  Visit Number 3  Number of Visits 12  Date for OT Re-Evaluation 07/10/24  Authorization  Authorization Type [employer]  OT Time Calculation  OT Start Time 1447  OT Stop Time 1528  OT Time Calculation (min) 41 min  End of Session  Activity Tolerance Patient tolerated treatment well  Behavior During Therapy [facility] for tasks assessed/performed      Past Medical History:  Diagnosis Date   Anxiety    Asthma    Depression    Essential hypertension    Fatty liver    Gastritis    Gastroparesis    GERD (gastroesophageal reflux disease)    Hiatal hernia    History of cardiac catheterization    Minimal coronary atherosclerosis February 2016   History of kidney stones    History of stroke 2008   HLD (hyperlipidemia)    Neuropathy    Obesity    Pancreatitis    Rheumatoid arthritis(714.0)    Stroke (HCC) 2008   Stroke (HCC) 01/2020   Tibialis tendinitis    Type 2 diabetes mellitus (HCC)    Past Surgical History:  Procedure Laterality Date   ANKLE SURGERY     remove extra bone-left   BIOPSY  04/25/2021   Procedure: BIOPSY;  Surgeon: Cindie Carlin POUR, DO;  [facility];  Service: Endoscopy;;   CARPAL TUNNEL RELEASE     right side   CARPAL TUNNEL RELEASE Left 02/06/2013   Procedure: CARPAL TUNNEL RELEASE ;  Surgeon: Taft FORBES Minerva, MD;  Location: [facility];  Service: Orthopedics;  Laterality: Left;  procedure end 1135   CARPAL TUNNEL RELEASE Right 09/14/2015   Procedure: RIGHT CARPAL TUNNEL RELEASE;  Surgeon: Taft FORBES Minerva, MD;  Location: [facility];  Service: Orthopedics;  Laterality: Right;   CARPAL TUNNEL RELEASE Left 09/28/2015   Procedure: LEFT CARPAL TUNNEL RELEASE;  Surgeon: Taft FORBES Minerva, MD;  Location: [facility];  Service:  Orthopedics;  Laterality: Left;  procedure 1   CESAREAN SECTION     CHOLECYSTECTOMY     COLONOSCOPY WITH PROPOFOL   09/16/2012   DOQ:Qpcz sessile polyps ranging between 3-97mm in size were found in the sigmoid colon and rectum (hyperplastic)/Mild diverticulosis was noted throughout the entire examined colon/Small internal hemorrhoids. Repeat in 10 years.   COLONOSCOPY WITH PROPOFOL  N/A 09/24/2022   Procedure: COLONOSCOPY WITH PROPOFOL ;  Surgeon: Cindie Carlin POUR, DO;  [facility];  Service: Endoscopy;  Laterality: N/A;  12:15pm, asa 3   DILATATION & CURETTAGE/HYSTEROSCOPY WITH MYOSURE N/A 08/05/2019   Procedure: DILATATION & CURETTAGE/HYSTEROSCOPY WITH MYOSURE;  Surgeon: Nicholaus Burnard HERO, MD;  Location: [facility];  Service: Gynecology;  Laterality: N/A;  myosure rep will be here.  Confirmed on 07/30/19 CS   DILATION AND CURETTAGE OF UTERUS     multiple   ESOPHAGOGASTRODUODENOSCOPY  07/29/2009   Mild gastritis, benign path   ESOPHAGOGASTRODUODENOSCOPY (EGD) WITH PROPOFOL   09/16/2012   SLF:Non-erosive gastritis (inflammation) was found; multiple bx/The duodenal mucosa showed no abnormalities in the ampulla and bulb and second portion of the duodenum/NAUSEA/VOMITING MOST LIKELY DUE TO GERD/GASTRITIS   ESOPHAGOGASTRODUODENOSCOPY (EGD) WITH PROPOFOL  N/A 04/25/2021   Surgeon: Cindie Carlin POUR, DO; Gastritis biopsied, normal examined duodenum biopsied.  Pathology revealed focal chronic gastritis, negative for H. Pylori, benign small  bowel mucosa.   FINGER SURGERY Left    left little finger-otif of finger   Gastric Emptying  07/27/2009   The amount of activity in the stomach at 120 minutes was 13% which is in the normal range   LASER ABLATION OF THE CERVIX     LEFT AND RIGHT HEART CATHETERIZATION WITH CORONARY ANGIOGRAM N/A 01/03/2015   Procedure: LEFT AND RIGHT HEART CATHETERIZATION WITH CORONARY ANGIOGRAM;  Surgeon: Ozell JONETTA Fell, MD;  Location: [facility] C[facility];  Service:  Cardiovascular;  Laterality: N/A;   LITHOTRIPSY     POLYPECTOMY  09/16/2012   Procedure: POLYPECTOMY;  Surgeon: Margo LITTIE Haddock, MD;  Location: [facility];  Service: Endoscopy;  Laterality: N/A;   POLYPECTOMY  09/24/2022   Procedure: POLYPECTOMY;  Surgeon: Cindie Carlin POUR, DO;  [facility];  Service: Endoscopy;;   REVERSE SHOULDER ARTHROPLASTY Right 05/07/2024   Procedure: ARTHROPLASTY, SHOULDER, TOTAL, REVERSE;  Surgeon: Onesimo Oneil LABOR, MD;  Location: [facility];  Service: Orthopedics;  Laterality: Right;   SAVORY DILATION  09/16/2012   Procedure: SAVORY DILATION;  Surgeon: Margo LITTIE Haddock, MD;  Location: [facility];  Service: Endoscopy;  Laterality: N/A;  16 fr dilation   TRIGGER FINGER RELEASE Left 02/06/2013   Procedure: RELEASE TRIGGER FINGER LEFT LONG FINGER/A-1 PULLEY;  Surgeon: Taft FORBES Minerva, MD;  Location: [facility];  Service: Orthopedics;  Laterality: Left;  procedure began 1136   TRIGGER FINGER RELEASE Right 09/14/2015   Procedure: RIGHT LONG FINGER TRIGGER RELEASE;  Surgeon: Taft FORBES Minerva, MD;  Location: [facility];  Service: Orthopedics;  Laterality: Right;   TRIGGER FINGER RELEASE Left 09/28/2015   Procedure: LEFT RING TRIGGER FINGER RELEASE;  Surgeon: Taft FORBES Minerva, MD;  Location: [facility];  Service: Orthopedics;  Laterality: Left;  left ring finger   TRIGGER FINGER RELEASE Right 08/07/2023   Procedure: RELEASE TRIGGER FINGER/A-1 PULLEY Right thumb;  Surgeon: Minerva Taft FORBES, MD;  Location: [facility];  Service: Orthopedics;  Laterality: Right;   TUBAL LIGATION     Patient Active Problem List   Diagnosis Date Noted   Right rotator cuff tear 05/07/2024   Multiple wounds of skin 09/25/2023   Cellulitis of upper back excluding scapular region 09/25/2023   Trigger finger of right thumb 08/07/2023   Constipation 11/28/2022   Alternating constipation and diarrhea 09/03/2022   Dizziness 04/25/2022   History of colonic polyps 03/14/2022   Long-term current use of opiate  analgesic 07/07/2021   Gastroesophageal reflux disease 03/27/2021   Left foot drop 03/17/2020   Former smoker    Chronic cystitis with hematuria 01/22/2020   Nephrolithiasis 01/22/2020   Hyperlipidemia associated with type 2 diabetes mellitus (HCC) 12/08/2019   Morbid obesity due to excess calories (HCC) 09/05/2015   Chronic pain syndrome 03/30/2015   History of CVA (cerebrovascular accident) without residual deficits 12/23/2014   Dyspnea on exertion 11/29/2014   Lumbar pain with radiation down left leg 02/16/2013   Gastroparesis 07/19/2009   Type 2 diabetes mellitus with vascular disease (HCC) 07/15/2009   ANXIETY 07/15/2009   Depression 07/15/2009   Essential hypertension 07/15/2009   HIATAL HERNIA 07/15/2009   Fatty liver 07/15/2009    PCP: Alphonsa Hamilton, MD REFERRING PROVIDER: Onesimo Oneil, MD  ONSET DATE: 05/07/24  REFERRING DIAG: R Reverse Shoulder Arthroplasty  THERAPY DIAG:  No diagnosis found.  Rationale for Evaluation and Treatment: Rehabilitation  SUBJECTIVE:   SUBJECTIVE STATEMENT: I think it is doing really well. Pt accompanied by: self  PERTINENT HISTORY: Pt s/p  R Reverse Shoulder Arthroplasty on 05/07/24  PRECAUTIONS: Shoulder - See protocol  WEIGHT BEARING RESTRICTIONS: Yes >1#  PAIN:  Are you having pain? No  FALLS: Has patient fallen in last 6 months? Yes. Number of falls 5  PLOF: Independent  PATIENT GOALS: To improve mobility  NEXT MD VISIT: 06/16/24  OBJECTIVE:   HAND DOMINANCE: Right  ADLs: Overall ADLs: Pt having most problems with lifting and carrying items for cooking and cleaning.   FUNCTIONAL OUTCOME MEASURES: Upper Extremity Functional Scale (UEFS): 44/80 - 55%  UPPER EXTREMITY ROM:       Assessed in supine, er/IR adducted  Passive ROM Right eval  Shoulder flexion 151  Shoulder abduction 153  Shoulder internal rotation 90  Shoulder external rotation 41  (Blank rows = not tested)    UPPER EXTREMITY MMT:      Assessed in seated, er/IR adducted  MMT Right eval  Shoulder flexion   Shoulder abduction   Shoulder internal rotation   Shoulder external rotation   (Blank rows = not tested)   SENSATION: WFL  EDEMA: No swelling noted  OBSERVATIONS: Moderate fascial restrictions along biceps and trapezius.    TODAY'S TREATMENT:                                                                                                                              DATE:   06/05/24 -Manual Therapy: myofascial release and trigger point applied to the biceps, triceps, subscapular region, and trapezius, in order to reduce pain and fascial restrictions, as well as improve ROM.  -P/ROM: supine, flexion, abduction, er/IR, x10 -AA/ROM: supine, flexion, abduction, protraction, horizontal abduction, er/IR, x10 -Scapular ROM: elevation/depression, rows, x10 -Pulleys: flexion, abduction, x60 each -Wall Climbs: flexion, abduction, x10  06/02/24 -Manual Therapy: myofascial release and trigger point applied to the biceps, triceps, subscapular region, and trapezius, in order to reduce pain and fascial restrictions, as well as improve ROM.  -P/ROM: supine, flexion, abduction, er/IR, x10 -AA/ROM: supine, flexion, abduction, protraction, horizontal abduction, er/IR, x10 -Low level wall wash: 1' -Thumb tacs: 1'  05/29/24 -Table Slides: flexion, abduction, er, x10 -Pendulums: 2x30 -Elbow and Wrist ROM Review    PATIENT EDUCATION: Education details: AA/ROM Person educated: Patient Education method: Programmer, multimedia, Demonstration, and Handouts Education comprehension: verbalized understanding and returned demonstration  HOME EXERCISE PROGRAM: 7/11: Table Slides and Pendulums 7/16: AA/ROM  GOALS: Goals reviewed with patient? Yes   SHORT TERM GOALS: Target date: 06/18/24  Pt will be provided with and educated on HEP to improve mobility in RUE required for use during ADL completion.   Goal status: IN  PROGRESS  3.  Pt will increase RUE strength to 4-/5 to improve ability to reach for items at waist to chest height during bathing and grooming tasks.   Goal status: IN PROGRESS   LONG TERM GOALS: Target date: 07/10/24  Pt will decrease pain in RUE to 3/10 or less to improve ability to sleep for 2+ consecutive hours  without waking due to pain.   Goal status: IN PROGRESS  2.  Pt will decrease RUE fascial restrictions to min amounts or less to improve mobility required for functional reaching tasks.   Goal status: IN PROGRESS  3.  Pt will increase RUE A/ROM by 10 degrees to improve ability to use RUE when reaching overhead or behind back during dressing and bathing tasks.   Goal status: IN PROGRESS  4.  Pt will increase RUE strength to 5/5 or greater to improve ability to use RUE when lifting or carrying items during meal preparation/housework/yardwork tasks.   Goal status: IN PROGRESS  5.  Pt will return to highest level of function using RUE as dominant during functional task completion.   Goal status: IN PROGRESS   ASSESSMENT:  CLINICAL IMPRESSION: Pt continues to have minimal pain and reports completing all HEP daily with no issue. At this time, she is achieving 75% of full ROM with both passive and active assisted ROM. She has more discomfort in end ranges, however is able to work through it. OT added wall climbs and pulleys to continue addressing ROM in order for her to reach over head and behind back for improved dressing and bathing skills independently.   PERFORMANCE DEFICITS: in functional skills including in functional skills including ADLs, IADLs, coordination, tone, ROM, strength, pain, fascial restrictions, muscle spasms, and UE functional use.   PLAN:  OT FREQUENCY: 2x/week  OT DURATION: 6 weeks  PLANNED INTERVENTIONS: 97168 OT Re-evaluation, 97535 self care/ADL training, 02889 therapeutic exercise, 97530 therapeutic activity, 97112 neuromuscular re-education,  97140 manual therapy, 97010 moist heat, 97032 electrical stimulation (manual), passive range of motion, functional mobility training, energy conservation, coping strategies training, patient/family education, and DME and/or AE instructions  RECOMMENDED OTHER SERVICES: N/A  CONSULTED AND AGREED WITH PLAN OF CARE: Patient  PLAN FOR NEXT SESSION: Manual Therapy, P/ROM, Mercer Orts, Thumb Tacs  Valentin Nightingale, OTR/L [facility] [phone] Valentin Jillyn Nightingale, OT 06/05/2024, 3:03 PM

## 2024-06-08 ENCOUNTER — Ambulatory Visit (INDEPENDENT_AMBULATORY_CARE_PROVIDER_SITE_OTHER): Admitting: Nurse Practitioner

## 2024-06-08 ENCOUNTER — Encounter: Payer: Self-pay | Admitting: Nurse Practitioner

## 2024-06-08 ENCOUNTER — Ambulatory Visit: Admitting: Nurse Practitioner

## 2024-06-08 VITALS — BP 108/64 | HR 87 | Temp 98.1°F | Ht 62.0 in | Wt 177.0 lb

## 2024-06-08 DIAGNOSIS — F419 Anxiety disorder, unspecified: Secondary | ICD-10-CM | POA: Diagnosis not present

## 2024-06-08 DIAGNOSIS — D649 Anemia, unspecified: Secondary | ICD-10-CM | POA: Diagnosis not present

## 2024-06-08 DIAGNOSIS — N390 Urinary tract infection, site not specified: Secondary | ICD-10-CM | POA: Diagnosis not present

## 2024-06-08 DIAGNOSIS — R55 Syncope and collapse: Secondary | ICD-10-CM | POA: Diagnosis not present

## 2024-06-08 DIAGNOSIS — F32A Depression, unspecified: Secondary | ICD-10-CM

## 2024-06-08 LAB — POCT URINE DIPSTICK
Bilirubin, UA: NEGATIVE
Blood, UA: NEGATIVE
Glucose, UA: NEGATIVE mg/dL
Ketones, POC UA: NEGATIVE mg/dL
Leukocytes, UA: NEGATIVE
Nitrite, UA: NEGATIVE
POC PROTEIN,UA: NEGATIVE
Spec Grav, UA: 1.01 (ref 1.010–1.025)
Urobilinogen, UA: 0.2 U/dL
pH, UA: 6 (ref 5.0–8.0)

## 2024-06-08 NOTE — Progress Notes (Unsigned)
 urine

## 2024-06-09 ENCOUNTER — Encounter: Payer: Self-pay | Admitting: Nurse Practitioner

## 2024-06-09 ENCOUNTER — Ambulatory Visit (HOSPITAL_COMMUNITY): Admitting: Occupational Therapy

## 2024-06-09 ENCOUNTER — Encounter (HOSPITAL_COMMUNITY): Payer: Self-pay | Admitting: Occupational Therapy

## 2024-06-09 ENCOUNTER — Ambulatory Visit: Payer: Self-pay | Admitting: Nurse Practitioner

## 2024-06-09 ENCOUNTER — Other Ambulatory Visit: Payer: Self-pay | Admitting: Nurse Practitioner

## 2024-06-09 DIAGNOSIS — M25511 Pain in right shoulder: Secondary | ICD-10-CM | POA: Diagnosis not present

## 2024-06-09 DIAGNOSIS — D649 Anemia, unspecified: Secondary | ICD-10-CM

## 2024-06-09 DIAGNOSIS — M25611 Stiffness of right shoulder, not elsewhere classified: Secondary | ICD-10-CM

## 2024-06-09 DIAGNOSIS — R29898 Other symptoms and signs involving the musculoskeletal system: Secondary | ICD-10-CM

## 2024-06-09 LAB — IRON,TIBC AND FERRITIN PANEL
Ferritin: 116 ng/mL (ref 15–150)
Iron Saturation: 28 % (ref 15–55)
Iron: 78 ug/dL (ref 27–139)
Total Iron Binding Capacity: 276 ug/dL (ref 250–450)
UIBC: 198 ug/dL (ref 118–369)

## 2024-06-09 LAB — VITAMIN B12: Vitamin B-12: 466 pg/mL (ref 232–1245)

## 2024-06-09 NOTE — Therapy (Signed)
 OUTPATIENT OCCUPATIONAL THERAPY ORTHO TREATMENT NOTE  Patient Name: Jamie Harding MRN: [id number] DOB:June 10, 1956, 68 y.o., female Today's Date: 06/09/2024   END OF SESSION:   OT End of Session - 06/09/24 1517     Visit Number 4    Number of Visits 12    Date for OT Re-Evaluation 07/10/24    Authorization Type [employer]    OT Start Time 1436    OT Stop Time 1515    OT Time Calculation (min) 39 min    Activity Tolerance Patient tolerated treatment well    Behavior During Therapy [facility] for tasks assessed/performed            Past Medical History:  Diagnosis Date   Anxiety    Asthma    Depression    Essential hypertension    Fatty liver    Gastritis    Gastroparesis    GERD (gastroesophageal reflux disease)    Hiatal hernia    History of cardiac catheterization    Minimal coronary atherosclerosis February 2016   History of kidney stones    History of stroke 2008   HLD (hyperlipidemia)    Neuropathy    Obesity    Pancreatitis    Rheumatoid arthritis(714.0)    Stroke (HCC) 2008   Stroke (HCC) 01/2020   Tibialis tendinitis    Type 2 diabetes mellitus (HCC)    Past Surgical History:  Procedure Laterality Date   ANKLE SURGERY     remove extra bone-left   BIOPSY  04/25/2021   Procedure: BIOPSY;  Surgeon: Cindie Carlin POUR, DO;  [facility]: [facility];  Service: Endoscopy;;   CARPAL TUNNEL RELEASE     right side   CARPAL TUNNEL RELEASE Left 02/06/2013   Procedure: CARPAL TUNNEL RELEASE ;  Surgeon: Taft FORBES Minerva, MD;  Location: [facility];  Service: Orthopedics;  Laterality: Left;  procedure end 1135   CARPAL TUNNEL RELEASE Right 09/14/2015   Procedure: RIGHT CARPAL TUNNEL RELEASE;  Surgeon: Taft FORBES Minerva, MD;  Location: [facility];  Service: Orthopedics;  Laterality: Right;   CARPAL TUNNEL RELEASE Left 09/28/2015   Procedure: LEFT CARPAL TUNNEL RELEASE;  Surgeon: Taft FORBES Minerva, MD;  Location: [facility];  Service: Orthopedics;  Laterality: Left;   procedure 1   CESAREAN SECTION     CHOLECYSTECTOMY     COLONOSCOPY WITH PROPOFOL   09/16/2012   DOQ:Qpcz sessile polyps ranging between 3-16mm in size were found in the sigmoid colon and rectum (hyperplastic)/Mild diverticulosis was noted throughout the entire examined colon/Small internal hemorrhoids. Repeat in 10 years.   COLONOSCOPY WITH PROPOFOL  N/A 09/24/2022   Procedure: COLONOSCOPY WITH PROPOFOL ;  Surgeon: Cindie Carlin POUR, DO;  [facility]: [facility];  Service: Endoscopy;  Laterality: N/A;  12:15pm, asa 3   DILATATION & CURETTAGE/HYSTEROSCOPY WITH MYOSURE N/A 08/05/2019   Procedure: DILATATION & CURETTAGE/HYSTEROSCOPY WITH MYOSURE;  Surgeon: Nicholaus Burnard HERO, MD;  Location: [facility];  Service: Gynecology;  Laterality: N/A;  myosure rep will be here.  Confirmed on 07/30/19 CS   DILATION AND CURETTAGE OF UTERUS     multiple   ESOPHAGOGASTRODUODENOSCOPY  07/29/2009   Mild gastritis, benign path   ESOPHAGOGASTRODUODENOSCOPY (EGD) WITH PROPOFOL   09/16/2012   SLF:Non-erosive gastritis (inflammation) was found; multiple bx/The duodenal mucosa showed no abnormalities in the ampulla and bulb and second portion of the duodenum/NAUSEA/VOMITING MOST LIKELY DUE TO GERD/GASTRITIS   ESOPHAGOGASTRODUODENOSCOPY (EGD) WITH PROPOFOL  N/A 04/25/2021   Surgeon: Cindie Carlin POUR, DO; Gastritis biopsied, normal  examined duodenum biopsied.  Pathology revealed focal chronic gastritis, negative for H. Pylori, benign small bowel mucosa.   FINGER SURGERY Left    left little finger-otif of finger   Gastric Emptying  07/27/2009   The amount of activity in the stomach at 120 minutes was 13% which is in the normal range   LASER ABLATION OF THE CERVIX     LEFT AND RIGHT HEART CATHETERIZATION WITH CORONARY ANGIOGRAM N/A 01/03/2015   Procedure: LEFT AND RIGHT HEART CATHETERIZATION WITH CORONARY ANGIOGRAM;  Surgeon: Ozell JONETTA Fell, MD;  Location: [facility];  Service: Cardiovascular;  Laterality: N/A;    LITHOTRIPSY     POLYPECTOMY  09/16/2012   Procedure: POLYPECTOMY;  Surgeon: Margo LITTIE Haddock, MD;  Location: [facility];  Service: Endoscopy;  Laterality: N/A;   POLYPECTOMY  09/24/2022   Procedure: POLYPECTOMY;  Surgeon: Cindie Carlin POUR, DO;  [facility];  Service: Endoscopy;;   REVERSE SHOULDER ARTHROPLASTY Right 05/07/2024   Procedure: ARTHROPLASTY, SHOULDER, TOTAL, REVERSE;  Surgeon: Onesimo Oneil LABOR, MD;  Location: [facility];  Service: Orthopedics;  Laterality: Right;   SAVORY DILATION  09/16/2012   Procedure: SAVORY DILATION;  Surgeon: Margo LITTIE Haddock, MD;  Location: [facility];  Service: Endoscopy;  Laterality: N/A;  16 fr dilation   TRIGGER FINGER RELEASE Left 02/06/2013   Procedure: RELEASE TRIGGER FINGER LEFT LONG FINGER/A-1 PULLEY;  Surgeon: Taft FORBES Minerva, MD;  Location: [facility];  Service: Orthopedics;  Laterality: Left;  procedure began 1136   TRIGGER FINGER RELEASE Right 09/14/2015   Procedure: RIGHT LONG FINGER TRIGGER RELEASE;  Surgeon: Taft FORBES Minerva, MD;  Location: [facility];  Service: Orthopedics;  Laterality: Right;   TRIGGER FINGER RELEASE Left 09/28/2015   Procedure: LEFT RING TRIGGER FINGER RELEASE;  Surgeon: Taft FORBES Minerva, MD;  Location: [facility];  Service: Orthopedics;  Laterality: Left;  left ring finger   TRIGGER FINGER RELEASE Right 08/07/2023   Procedure: RELEASE TRIGGER FINGER/A-1 PULLEY Right thumb;  Surgeon: Minerva Taft FORBES, MD;  Location: [facility];  Service: Orthopedics;  Laterality: Right;   TUBAL LIGATION     Patient Active Problem List   Diagnosis Date Noted   Vasovagal syncope 06/08/2024   Anemia 06/08/2024   Urinary tract infection without hematuria 06/08/2024   Right rotator cuff tear 05/07/2024   Multiple wounds of skin 09/25/2023   Cellulitis of upper back excluding scapular region 09/25/2023   Trigger finger of right thumb 08/07/2023   Constipation 11/28/2022   Alternating constipation and diarrhea 09/03/2022   Dizziness 04/25/2022    History of colonic polyps 03/14/2022   Long-term current use of opiate analgesic 07/07/2021   Gastroesophageal reflux disease 03/27/2021   Left foot drop 03/17/2020   Former smoker    Chronic cystitis with hematuria 01/22/2020   Nephrolithiasis 01/22/2020   Hyperlipidemia associated with type 2 diabetes mellitus (HCC) 12/08/2019   Morbid obesity due to excess calories (HCC) 09/05/2015   Chronic pain syndrome 03/30/2015   History of CVA (cerebrovascular accident) without residual deficits 12/23/2014   Dyspnea on exertion 11/29/2014   Lumbar pain with radiation down left leg 02/16/2013   Gastroparesis 07/19/2009   Type 2 diabetes mellitus with vascular disease (HCC) 07/15/2009   ANXIETY 07/15/2009   Anxiety and depression 07/15/2009   Essential hypertension 07/15/2009   HIATAL HERNIA 07/15/2009   Fatty liver 07/15/2009    PCP: Alphonsa Hamilton, MD REFERRING PROVIDER: Onesimo Oneil, MD  ONSET DATE: 05/07/24  REFERRING DIAG: R Reverse Shoulder Arthroplasty  THERAPY  DIAG:  Acute pain of right shoulder  Shoulder stiffness, right  Other symptoms and signs involving the musculoskeletal system  Rationale for Evaluation and Treatment: Rehabilitation  SUBJECTIVE:   SUBJECTIVE STATEMENT: It feels like it catches sometimes when it moves a certain way.   PERTINENT HISTORY: Pt s/p R Reverse Shoulder Arthroplasty on 05/07/24  PRECAUTIONS: Shoulder - See protocol  WEIGHT BEARING RESTRICTIONS: Yes >1#  PAIN:  Are you having pain? No  FALLS: Has patient fallen in last 6 months? Yes. Number of falls 5  PLOF: Independent  PATIENT GOALS: To improve mobility  NEXT MD VISIT: 06/16/24  OBJECTIVE:   HAND DOMINANCE: Right  ADLs: Overall ADLs: Pt having most problems with lifting and carrying items for cooking and cleaning.   FUNCTIONAL OUTCOME MEASURES: Upper Extremity Functional Scale (UEFS): 44/80 - 55%  UPPER EXTREMITY ROM:       Assessed in supine, er/IR  adducted  Passive ROM Right eval  Shoulder flexion 151  Shoulder abduction 153  Shoulder internal rotation 90  Shoulder external rotation 41  (Blank rows = not tested)    UPPER EXTREMITY MMT:     Assessed in seated, er/IR adducted  MMT Right eval  Shoulder flexion   Shoulder abduction   Shoulder internal rotation   Shoulder external rotation   (Blank rows = not tested)    OBSERVATIONS: Moderate fascial restrictions along biceps and trapezius.    TODAY'S TREATMENT:                                                                                                                              DATE:  06/09/24 -Manual Therapy: myofascial release and trigger point applied to the biceps, triceps, subscapular region, and trapezius, in order to reduce pain and fascial restrictions, as well as improve ROM.  -P/ROM: supine-flexion, abduction, er/IR, 10 reps -AA/ROM: supine-protraction, flexion, abduction, protraction, horizontal abduction, er/IR, 10 reps -AA/ROM: sitting- protraction, flexion, er, abduction, horizontal abduction, 10 reps -Wall wash: 1'  -Thumb tacks: 1', shoulder height  06/05/24 -Manual Therapy: myofascial release and trigger point applied to the biceps, triceps, subscapular region, and trapezius, in order to reduce pain and fascial restrictions, as well as improve ROM.  -P/ROM: supine, flexion, abduction, er/IR, x10 -AA/ROM: supine, flexion, abduction, protraction, horizontal abduction, er/IR, x10 -Scapular ROM: elevation/depression, rows, x10 -Pulleys: flexion, abduction, x60 each -Wall Climbs: flexion, abduction, x10  06/02/24 -Manual Therapy: myofascial release and trigger point applied to the biceps, triceps, subscapular region, and trapezius, in order to reduce pain and fascial restrictions, as well as improve ROM.  -P/ROM: supine, flexion, abduction, er/IR, x10 -AA/ROM: supine, flexion, abduction, protraction, horizontal abduction, er/IR, x10 -Low level  wall wash: 1' -Thumb tacs: 1'    PATIENT EDUCATION: Education details: reviewed HEP Person educated: Patient Education method: Explanation, Demonstration, and Handouts Education comprehension: verbalized understanding and returned demonstration  HOME EXERCISE PROGRAM: 7/11: Table Slides and Pendulums 7/16: AA/ROM  GOALS: Goals reviewed with patient? Yes  SHORT TERM GOALS: Target date: 06/18/24  Pt will be provided with and educated on HEP to improve mobility in RUE required for use during ADL completion.   Goal status: IN PROGRESS  3.  Pt will increase RUE strength to 4-/5 to improve ability to reach for items at waist to chest height during bathing and grooming tasks.   Goal status: IN PROGRESS   LONG TERM GOALS: Target date: 07/10/24  Pt will decrease pain in RUE to 3/10 or less to improve ability to sleep for 2+ consecutive hours without waking due to pain.   Goal status: IN PROGRESS  2.  Pt will decrease RUE fascial restrictions to min amounts or less to improve mobility required for functional reaching tasks.   Goal status: IN PROGRESS  3.  Pt will increase RUE A/ROM by 10 degrees to improve ability to use RUE when reaching overhead or behind back during dressing and bathing tasks.   Goal status: IN PROGRESS  4.  Pt will increase RUE strength to 5/5 or greater to improve ability to use RUE when lifting or carrying items during meal preparation/housework/yardwork tasks.   Goal status: IN PROGRESS  5.  Pt will return to highest level of function using RUE as dominant during functional task completion.   Goal status: IN PROGRESS   ASSESSMENT:  CLINICAL IMPRESSION: Pt reports intermittent cramping sensation with random motions, not consistent across motions. Continued with manual techniques and passive stretching, AA/ROM. Pt with great joint mobility, demonstrating ROM to 50%+ today.  Pt completing wall wash and shoulder height thumb tacks without difficulty,  reports in prior session had difficulty reaching overhead. Verbal cuing for form and technique.   PERFORMANCE DEFICITS: in functional skills including in functional skills including ADLs, IADLs, coordination, tone, ROM, strength, pain, fascial restrictions, muscle spasms, and UE functional use.   PLAN:  OT FREQUENCY: 2x/week  OT DURATION: 6 weeks  PLANNED INTERVENTIONS: 97168 OT Re-evaluation, 97535 self care/ADL training, 02889 therapeutic exercise, 97530 therapeutic activity, 97112 neuromuscular re-education, 97140 manual therapy, 97010 moist heat, 97032 electrical stimulation (manual), passive range of motion, functional mobility training, energy conservation, coping strategies training, patient/family education, and DME and/or AE instructions  CONSULTED AND AGREED WITH PLAN OF CARE: Patient  PLAN FOR NEXT SESSION: follow up on HEP, manual techniques, AA/ROM, functional reaching   Sonny Cory, OTR/L  [phone] 06/09/2024, 3:18 PM

## 2024-06-09 NOTE — Progress Notes (Signed)
 Subjective:    Patient ID: Jamie Harding, female    DOB: 03-Jun-1956, 68 y.o.   MRN: [id number]  HPI Presents for follow-up after ED visit on 05/19/2024 for accidental fall and syncopal episode.  Patient states she was scheduled to have her sutures removed from her right shoulder.  Took a shower.  Started walking down the hall and blacked out after this.  Does not remember anything until she was sitting on the couch.  Patient denies any symptoms before the episode.  States her sugar and blood pressure were checked by EMS.  Was transported to the hospital for evaluation.  Notes indicate that she was positive for nitrites and small amount of leukocytes, 6-10 WBCs per hpf with rare bacteria at that time.  Completed an antibiotic.  Does not have any urinary symptoms at this point.  No acute abnormality noted on CT head with contrast.  Patient had a colonoscopy last year but was not completed due to incomplete bowel emptying.  Plans to repeat this later this year.  Notes she had surgery on 6/19 for right shoulder repair.  Follows up with orthopedics for this. Has been struggling with anxiety and depression.  Currently on Effexor  twice daily.  Has been on this a long time.  Her main side effect is a flat affect.  Took duloxetine at 1 point which worked well with less side effects.  Patient has been under tremendous stress related to some family issues.  Reports that she was taken off all blood pressure medications at a previous visit.   Review of Systems  Constitutional:  Positive for fatigue.  Respiratory:  Negative for cough, chest tightness, shortness of breath and wheezing.   Cardiovascular:  Negative for chest pain and leg swelling.  Genitourinary:  Negative for dysuria, enuresis, flank pain, frequency and urgency.  Neurological:  Positive for syncope. Negative for seizures, facial asymmetry, speech difficulty, weakness, light-headedness, numbness and headaches.       Patient reports that is her only  episode of syncope, has not had any before or since that episode.  Psychiatric/Behavioral:  Positive for dysphoric mood and sleep disturbance. Negative for suicidal ideas. The patient is nervous/anxious.       06/08/2024   11:13 AM  Depression screen PHQ 2/9  Decreased Interest 3  Down, Depressed, Hopeless 3  PHQ - 2 Score 6  Altered sleeping 3  Tired, decreased energy 3  Change in appetite 2  Feeling bad or failure about yourself  2  Trouble concentrating 2  Moving slowly or fidgety/restless 2  Suicidal thoughts 0  PHQ-9 Score 20  Difficult doing work/chores Very difficult      06/08/2024   11:13 AM 05/15/2024    1:50 PM 04/08/2024    1:47 PM 01/24/2024    2:57 PM  GAD 7 : Generalized Anxiety Score  Nervous, Anxious, on Edge 3 2 3 2   Control/stop worrying 3 2 3 2   Worry too much - different things 3 2 3 2   Trouble relaxing 3 2 2 3   Restless 2 2 1 1   Easily annoyed or irritable 2 2 3 2   Afraid - awful might happen 2 2 3 2   Total GAD 7 Score 18 14 18 14   Anxiety Difficulty Very difficult Very difficult Very difficult Very difficult   Social History   Tobacco Use   Smoking status: Former    Current packs/day: 0.00    Average packs/day: 0.5 packs/day for 3.0 years (1.5 ttl pk-yrs)  Types: Cigarettes    Start date: 09/10/1984    Quit date: 09/11/1987    Years since quitting: 36.7   Smokeless tobacco: Never  Vaping Use   Vaping status: Never Used  Substance Use Topics   Alcohol use: No   Drug use: No        Objective:   Physical Exam NAD.  Alert, oriented.  Facial pallor noted.  Lungs clear.  Heart regular rate rhythm.  Gait slow but steady.  Gets onto exam table without assistance.  Abdomen soft nondistended nontender.  No CVA tenderness.  Mildly anxious affect.  Speech clear.  Making good eye contact.  Normal judgment and behavior. Today's Vitals   06/08/24 1106  BP: 108/64  Pulse: 87  Temp: 98.1 F (36.7 C)  SpO2: 97%  Weight: 177 lb (80.3 kg)  Height: 5'  2 (1.575 m)   Body mass index is 32.37 kg/m. 05/19/2024: CBC hemoglobin dropped from 13.5 the previous month to 11.6. Results for orders placed or performed in visit on 06/08/24  POCT URINE DIPSTICK   Collection Time: 06/08/24 12:01 PM  Result Value Ref Range   Color, UA yellow yellow   Clarity, UA clear clear   Glucose, UA negative negative mg/dL   Bilirubin, UA negative negative   Ketones, POC UA negative negative mg/dL   Spec Grav, UA 8.989 8.989 - 1.025   Blood, UA negative negative   pH, UA 6.0 5.0 - 8.0   POC PROTEIN,UA negative negative, trace   Urobilinogen, UA 0.2 0.2 or 1.0 E.U./dL   Nitrite, UA Negative Negative   Leukocytes, UA Negative Negative    Body mass index is 32.37 kg/m.      Assessment & Plan:   Problem List Items Addressed This Visit       Genitourinary   Urinary tract infection without hematuria   Relevant Orders   POCT URINE DIPSTICK (Completed)     Other   Anemia   Relevant Orders   Iron, TIBC and Ferritin Panel (Completed)   Vitamin B12 (Completed)   Anxiety and depression   Vasovagal syncope - Primary     UA is clear with no further UTI symptoms. Feel this episode was related to several factors including: BP is running low at times, patient had just completed a shower with probable vasodilation, she had mild anemia on her lab work and evidence of a UTI.  CT scan showed no acute changes.  Discussed measures to prevent syncopal episodes. Strongly encourage patient to contact GI specialist to get her colonoscopy done this year. Will check to see how we can switch her Effexor  to duloxetine with hopefully minimal adverse effects. Iron panel and vitamin B12 ordered due to anemia. Further follow-up based on results.

## 2024-06-10 ENCOUNTER — Encounter (HOSPITAL_COMMUNITY): Payer: Self-pay | Admitting: Occupational Therapy

## 2024-06-10 ENCOUNTER — Encounter (HOSPITAL_COMMUNITY): Payer: Self-pay

## 2024-06-11 ENCOUNTER — Encounter (HOSPITAL_COMMUNITY): Admitting: Occupational Therapy

## 2024-06-11 ENCOUNTER — Encounter (HOSPITAL_COMMUNITY): Payer: Self-pay

## 2024-06-15 ENCOUNTER — Encounter (HOSPITAL_COMMUNITY): Payer: Self-pay | Admitting: Occupational Therapy

## 2024-06-15 ENCOUNTER — Ambulatory Visit (HOSPITAL_COMMUNITY): Admitting: Occupational Therapy

## 2024-06-15 DIAGNOSIS — M25511 Pain in right shoulder: Secondary | ICD-10-CM

## 2024-06-15 DIAGNOSIS — M25611 Stiffness of right shoulder, not elsewhere classified: Secondary | ICD-10-CM

## 2024-06-15 DIAGNOSIS — R29898 Other symptoms and signs involving the musculoskeletal system: Secondary | ICD-10-CM

## 2024-06-15 NOTE — Therapy (Signed)
 OUTPATIENT OCCUPATIONAL THERAPY ORTHO TREATMENT NOTE  Patient Name: Jamie Harding MRN: [id number] DOB:[birth year], 68 y.o., female Today's Date: 06/15/2024   END OF SESSION:   OT End of Session - 06/15/24 1618     Visit Number 5    Number of Visits 12    Date for OT Re-Evaluation 07/10/24    Authorization Type [employer]    OT Start Time 1534    OT Stop Time 1618    OT Time Calculation (min) 44 min    Activity Tolerance Patient tolerated treatment well    Behavior During Therapy [facility] for tasks assessed/performed           Past Medical History:  Diagnosis Date   Anxiety    Asthma    Depression    Essential hypertension    Fatty liver    Gastritis    Gastroparesis    GERD (gastroesophageal reflux disease)    Hiatal hernia    History of cardiac catheterization    Minimal coronary atherosclerosis February 2016   History of kidney stones    History of stroke 2008   HLD (hyperlipidemia)    Neuropathy    Obesity    Pancreatitis    Rheumatoid arthritis(714.0)    Stroke (HCC) 2008   Stroke (HCC) 01/2020   Tibialis tendinitis    Type 2 diabetes mellitus (HCC)    Past Surgical History:  Procedure Laterality Date   ANKLE SURGERY     remove extra bone-left   BIOPSY  04/25/2021   Procedure: BIOPSY;  Surgeon: Cindie Carlin POUR, DO;  [facility]: [facility];  Service: Endoscopy;;   CARPAL TUNNEL RELEASE     right side   CARPAL TUNNEL RELEASE Left 02/06/2013   Procedure: CARPAL TUNNEL RELEASE ;  Surgeon: Taft FORBES Minerva, MD;  Location: [facility];  Service: Orthopedics;  Laterality: Left;  procedure end 1135   CARPAL TUNNEL RELEASE Right 09/14/2015   Procedure: RIGHT CARPAL TUNNEL RELEASE;  Surgeon: Taft FORBES Minerva, MD;  Location: [facility];  Service: Orthopedics;  Laterality: Right;   CARPAL TUNNEL RELEASE Left 09/28/2015   Procedure: LEFT CARPAL TUNNEL RELEASE;  Surgeon: Taft FORBES Minerva, MD;  Location: [facility];  Service: Orthopedics;  Laterality: Left;   procedure 1   CESAREAN SECTION     CHOLECYSTECTOMY     COLONOSCOPY WITH PROPOFOL   09/16/2012   DOQ:Qpcz sessile polyps ranging between 3-9mm in size were found in the sigmoid colon and rectum (hyperplastic)/Mild diverticulosis was noted throughout the entire examined colon/Small internal hemorrhoids. Repeat in 10 years.   COLONOSCOPY WITH PROPOFOL  N/A 09/24/2022   Procedure: COLONOSCOPY WITH PROPOFOL ;  Surgeon: Cindie Carlin POUR, DO;  [facility]: [facility];  Service: Endoscopy;  Laterality: N/A;  12:15pm, asa 3   DILATATION & CURETTAGE/HYSTEROSCOPY WITH MYOSURE N/A 08/05/2019   Procedure: DILATATION & CURETTAGE/HYSTEROSCOPY WITH MYOSURE;  Surgeon: Nicholaus Burnard HERO, MD;  Location: [facility];  Service: Gynecology;  Laterality: N/A;  myosure rep will be here.  Confirmed on 07/30/19 CS   DILATION AND CURETTAGE OF UTERUS     multiple   ESOPHAGOGASTRODUODENOSCOPY  07/29/2009   Mild gastritis, benign path   ESOPHAGOGASTRODUODENOSCOPY (EGD) WITH PROPOFOL   09/16/2012   SLF:Non-erosive gastritis (inflammation) was found; multiple bx/The duodenal mucosa showed no abnormalities in the ampulla and bulb and second portion of the duodenum/NAUSEA/VOMITING MOST LIKELY DUE TO GERD/GASTRITIS   ESOPHAGOGASTRODUODENOSCOPY (EGD) WITH PROPOFOL  N/A 04/25/2021   Surgeon: Cindie Carlin POUR, DO; Gastritis biopsied, normal examined  duodenum biopsied.  Pathology revealed focal chronic gastritis, negative for H. Pylori, benign small bowel mucosa.   FINGER SURGERY Left    left little finger-otif of finger   Gastric Emptying  07/27/2009   The amount of activity in the stomach at 120 minutes was 13% which is in the normal range   LASER ABLATION OF THE CERVIX     LEFT AND RIGHT HEART CATHETERIZATION WITH CORONARY ANGIOGRAM N/A 01/03/2015   Procedure: LEFT AND RIGHT HEART CATHETERIZATION WITH CORONARY ANGIOGRAM;  Surgeon: Ozell JONETTA Fell, MD;  Location: [facility];  Service: Cardiovascular;  Laterality: N/A;    LITHOTRIPSY     POLYPECTOMY  09/16/2012   Procedure: POLYPECTOMY;  Surgeon: Margo LITTIE Haddock, MD;  Location: [facility];  Service: Endoscopy;  Laterality: N/A;   POLYPECTOMY  09/24/2022   Procedure: POLYPECTOMY;  Surgeon: Cindie Carlin POUR, DO;  [facility];  Service: Endoscopy;;   REVERSE SHOULDER ARTHROPLASTY Right 05/07/2024   Procedure: ARTHROPLASTY, SHOULDER, TOTAL, REVERSE;  Surgeon: Onesimo Oneil LABOR, MD;  Location: [facility];  Service: Orthopedics;  Laterality: Right;   SAVORY DILATION  09/16/2012   Procedure: SAVORY DILATION;  Surgeon: Margo LITTIE Haddock, MD;  Location: [facility];  Service: Endoscopy;  Laterality: N/A;  16 fr dilation   TRIGGER FINGER RELEASE Left 02/06/2013   Procedure: RELEASE TRIGGER FINGER LEFT LONG FINGER/A-1 PULLEY;  Surgeon: Taft FORBES Minerva, MD;  Location: [facility];  Service: Orthopedics;  Laterality: Left;  procedure began 1136   TRIGGER FINGER RELEASE Right 09/14/2015   Procedure: RIGHT LONG FINGER TRIGGER RELEASE;  Surgeon: Taft FORBES Minerva, MD;  Location: [facility];  Service: Orthopedics;  Laterality: Right;   TRIGGER FINGER RELEASE Left 09/28/2015   Procedure: LEFT RING TRIGGER FINGER RELEASE;  Surgeon: Taft FORBES Minerva, MD;  Location: [facility];  Service: Orthopedics;  Laterality: Left;  left ring finger   TRIGGER FINGER RELEASE Right 08/07/2023   Procedure: RELEASE TRIGGER FINGER/A-1 PULLEY Right thumb;  Surgeon: Minerva Taft FORBES, MD;  Location: [facility];  Service: Orthopedics;  Laterality: Right;   TUBAL LIGATION     Patient Active Problem List   Diagnosis Date Noted   Vasovagal syncope 06/08/2024   Anemia 06/08/2024   Urinary tract infection without hematuria 06/08/2024   Right rotator cuff tear 05/07/2024   Multiple wounds of skin 09/25/2023   Cellulitis of upper back excluding scapular region 09/25/2023   Trigger finger of right thumb 08/07/2023   Constipation 11/28/2022   Alternating constipation and diarrhea 09/03/2022   Dizziness 04/25/2022    History of colonic polyps 03/14/2022   Long-term current use of opiate analgesic 07/07/2021   Gastroesophageal reflux disease 03/27/2021   Left foot drop 03/17/2020   Former smoker    Chronic cystitis with hematuria 01/22/2020   Nephrolithiasis 01/22/2020   Hyperlipidemia associated with type 2 diabetes mellitus (HCC) 12/08/2019   Morbid obesity due to excess calories (HCC) 09/05/2015   Chronic pain syndrome 03/30/2015   History of CVA (cerebrovascular accident) without residual deficits 12/23/2014   Dyspnea on exertion 11/29/2014   Lumbar pain with radiation down left leg 02/16/2013   Gastroparesis 07/19/2009   Type 2 diabetes mellitus with vascular disease (HCC) 07/15/2009   ANXIETY 07/15/2009   Anxiety and depression 07/15/2009   Essential hypertension 07/15/2009   HIATAL HERNIA 07/15/2009   Fatty liver 07/15/2009    PCP: Alphonsa Hamilton, MD REFERRING PROVIDER: Onesimo Oneil, MD  ONSET DATE: 05/07/24  REFERRING DIAG: R Reverse Shoulder Arthroplasty  THERAPY DIAG:  Acute pain of right shoulder  Shoulder stiffness, right  Other symptoms and signs involving the musculoskeletal system  Rationale for Evaluation and Treatment: Rehabilitation  SUBJECTIVE:   SUBJECTIVE STATEMENT: I fell Saturday and my arm was hurting real bad yesterday.   PERTINENT HISTORY: Pt s/p R Reverse Shoulder Arthroplasty on 05/07/24  PRECAUTIONS: Shoulder - See protocol  WEIGHT BEARING RESTRICTIONS: Yes >1#  PAIN:  Are you having pain? No  FALLS: Has patient fallen in last 6 months? Yes. Number of falls 5  PLOF: Independent  PATIENT GOALS: To improve mobility  NEXT MD VISIT: 06/16/24  OBJECTIVE:   HAND DOMINANCE: Right  ADLs: Overall ADLs: Pt having most problems with lifting and carrying items for cooking and cleaning.   FUNCTIONAL OUTCOME MEASURES: Upper Extremity Functional Scale (UEFS): 44/80 - 55%  UPPER EXTREMITY ROM:       Assessed in supine, er/IR adducted  Passive ROM  Right eval  Shoulder flexion 151  Shoulder abduction 153  Shoulder internal rotation 90  Shoulder external rotation 41  (Blank rows = not tested)    UPPER EXTREMITY MMT:     Assessed in seated, er/IR adducted  MMT Right eval  Shoulder flexion   Shoulder abduction   Shoulder internal rotation   Shoulder external rotation   (Blank rows = not tested)    OBSERVATIONS: Moderate fascial restrictions along biceps and trapezius.    TODAY'S TREATMENT:                                                                                                                              DATE:   06/15/24 -Manual Therapy: myofascial release and trigger point applied to the biceps, triceps, subscapular region, and trapezius, in order to reduce pain and fascial restrictions, as well as improve ROM.  -P/ROM: supine-flexion, abduction, er/IR, 10 reps -AA/ROM: supine-protraction, flexion, abduction, protraction, horizontal abduction, er/IR, 10 reps -Wall Slides: flexion, abduction, x10 -Pulleys: flexion, abduction, x60  06/09/24 -Manual Therapy: myofascial release and trigger point applied to the biceps, triceps, subscapular region, and trapezius, in order to reduce pain and fascial restrictions, as well as improve ROM.  -P/ROM: supine-flexion, abduction, er/IR, 10 reps -AA/ROM: supine-protraction, flexion, abduction, protraction, horizontal abduction, er/IR, 10 reps -AA/ROM: sitting- protraction, flexion, er, abduction, horizontal abduction, 10 reps -Wall wash: 1'  -Thumb tacks: 1', shoulder height  06/05/24 -Manual Therapy: myofascial release and trigger point applied to the biceps, triceps, subscapular region, and trapezius, in order to reduce pain and fascial restrictions, as well as improve ROM.  -P/ROM: supine, flexion, abduction, er/IR, x10 -AA/ROM: supine, flexion, abduction, protraction, horizontal abduction, er/IR, x10 -Scapular ROM: elevation/depression, rows, x10 -Pulleys: flexion,  abduction, x60 each -Wall Climbs: flexion, abduction, x10   PATIENT EDUCATION: Education details: reviewed HEP Person educated: Patient Education method: Programmer, multimedia, Demonstration, and Handouts Education comprehension: verbalized understanding and returned demonstration  HOME EXERCISE PROGRAM: 7/11: Table Slides and Pendulums 7/16: AA/ROM  GOALS: Goals reviewed with patient? Yes  SHORT TERM GOALS: Target date: 06/18/24  Pt will be provided with and educated on HEP to improve mobility in RUE required for use during ADL completion.   Goal status: IN PROGRESS  3.  Pt will increase RUE strength to 4-/5 to improve ability to reach for items at waist to chest height during bathing and grooming tasks.   Goal status: IN PROGRESS   LONG TERM GOALS: Target date: 07/10/24  Pt will decrease pain in RUE to 3/10 or less to improve ability to sleep for 2+ consecutive hours without waking due to pain.   Goal status: IN PROGRESS  2.  Pt will decrease RUE fascial restrictions to min amounts or less to improve mobility required for functional reaching tasks.   Goal status: IN PROGRESS  3.  Pt will increase RUE A/ROM by 10 degrees to improve ability to use RUE when reaching overhead or behind back during dressing and bathing tasks.   Goal status: IN PROGRESS  4.  Pt will increase RUE strength to 5/5 or greater to improve ability to use RUE when lifting or carrying items during meal preparation/housework/yardwork tasks.   Goal status: IN PROGRESS  5.  Pt will return to highest level of function using RUE as dominant during functional task completion.   Goal status: IN PROGRESS   ASSESSMENT:  CLINICAL IMPRESSION: Pt had good mobility during entire session. She was achieving full ROM with both passive and active assisted ROM. OT added wall slides and pulleys this session, both of which she finished with no difficulty. Verbal and visual cuing provided throughout  session.  PERFORMANCE DEFICITS: in functional skills including in functional skills including ADLs, IADLs, coordination, tone, ROM, strength, pain, fascial restrictions, muscle spasms, and UE functional use.   PLAN:  OT FREQUENCY: 2x/week  OT DURATION: 6 weeks  PLANNED INTERVENTIONS: 97168 OT Re-evaluation, 97535 self care/ADL training, 02889 therapeutic exercise, 97530 therapeutic activity, 97112 neuromuscular re-education, 97140 manual therapy, 97010 moist heat, 97032 electrical stimulation (manual), passive range of motion, functional mobility training, energy conservation, coping strategies training, patient/family education, and DME and/or AE instructions  CONSULTED AND AGREED WITH PLAN OF CARE: Patient  PLAN FOR NEXT SESSION: follow up on HEP, manual techniques, AA/ROM, functional reaching   Valentin Nightingale, OTR/L [phone] 06/15/2024, 4:38 PM

## 2024-06-16 ENCOUNTER — Other Ambulatory Visit (INDEPENDENT_AMBULATORY_CARE_PROVIDER_SITE_OTHER): Payer: Self-pay

## 2024-06-16 ENCOUNTER — Ambulatory Visit (INDEPENDENT_AMBULATORY_CARE_PROVIDER_SITE_OTHER): Admitting: Orthopedic Surgery

## 2024-06-16 ENCOUNTER — Encounter: Payer: Self-pay | Admitting: Orthopedic Surgery

## 2024-06-16 DIAGNOSIS — S46011D Strain of muscle(s) and tendon(s) of the rotator cuff of right shoulder, subsequent encounter: Secondary | ICD-10-CM

## 2024-06-16 DIAGNOSIS — M19019 Primary osteoarthritis, unspecified shoulder: Secondary | ICD-10-CM

## 2024-06-16 DIAGNOSIS — Z96611 Presence of right artificial shoulder joint: Secondary | ICD-10-CM | POA: Diagnosis not present

## 2024-06-16 NOTE — Progress Notes (Signed)
 Orthopaedic Postop Note  Assessment: Jamie Harding is a 68 y.o. female s/p Right Reverse Shoulder Arthroplasty  DOS: 05/01/2024  Plan: Jamie Harding is doing very well following surgery.  She has no pain.  She is progressing appropriately with therapy.  Radiographs remain unchanged.  Her motion is excellent at this point.  Continue working with therapy.  Follow-up in 6 weeks.   Follow-up: Return in about 6 weeks (around 07/28/2024).  XR at next visit: Right shoulder  Subjective:  Chief Complaint  Patient presents with   Post-op Follow-up    Improved / Right shoulder / she fell on Saturday but still feels ok     History of Present Illness: Jamie Harding is a 68 y.o. female who presents following the above stated procedure.  Surgery was approximately 6 weeks ago.  She is doing very well.  She does report falling on the weekend, but her shoulder feels fine now.  She is working with physical therapy.  Therapy is not hurting her arm.  No issues with the incision.   Review of Systems: No fevers or chills No numbness or tingling No Chest Pain No shortness of breath   Objective: There were no vitals taken for this visit.  Physical Exam:  Alert and oriented, no acute distress  Surgical incision is healing well, no surrounding erythema or drainage Sensation intact in the axillary nerve distribution Active motion intact in the hand 2+ radial pulse Sensation intact throughout the hand Tolerates gentle range of motion of the shoulder -active assisted forward flexion to 170, active assisted abduction to 110 60 degrees of external rotation at her side.  IMAGING: I personally ordered and reviewed the following images:  X-ray of the right shoulder was obtained in clinic today.  Images are compared to prior x-rays.  Reverse shoulder arthroplasty remains in stable alignment.  No change in position.  There is no subsidence.  No fracture.  No lucency around the  prosthesis.  Impression: Stable right reverse shoulder arthroplasty  [name], MD 06/16/2024 1:42 PM

## 2024-06-17 ENCOUNTER — Ambulatory Visit (HOSPITAL_COMMUNITY): Admitting: Occupational Therapy

## 2024-06-17 ENCOUNTER — Encounter (HOSPITAL_COMMUNITY): Payer: Self-pay | Admitting: Occupational Therapy

## 2024-06-17 DIAGNOSIS — M25511 Pain in right shoulder: Secondary | ICD-10-CM

## 2024-06-17 DIAGNOSIS — R29898 Other symptoms and signs involving the musculoskeletal system: Secondary | ICD-10-CM

## 2024-06-17 DIAGNOSIS — M25611 Stiffness of right shoulder, not elsewhere classified: Secondary | ICD-10-CM

## 2024-06-17 NOTE — Therapy (Unsigned)
 OUTPATIENT OCCUPATIONAL THERAPY ORTHO TREATMENT NOTE  Patient Name: Jamie Harding MRN: [id number] DOB:07-29-1956, 68 y.o., female Today's Date: 06/18/2024   END OF SESSION:   OT End of Session - 06/17/24 1629     Visit Number 6    Number of Visits 12    Date for OT Re-Evaluation 07/10/24    Authorization Type [employer]    OT Start Time 1548    OT Stop Time 1629    OT Time Calculation (min) 41 min    Activity Tolerance Patient tolerated treatment well    Behavior During Therapy [facility] for tasks assessed/performed          Past Medical History:  Diagnosis Date   Anxiety    Asthma    Depression    Essential hypertension    Fatty liver    Gastritis    Gastroparesis    GERD (gastroesophageal reflux disease)    Hiatal hernia    History of cardiac catheterization    Minimal coronary atherosclerosis February 2016   History of kidney stones    History of stroke 2008   HLD (hyperlipidemia)    Neuropathy    Obesity    Pancreatitis    Rheumatoid arthritis(714.0)    Stroke (HCC) 2008   Stroke (HCC) 01/2020   Tibialis tendinitis    Type 2 diabetes mellitus (HCC)    Past Surgical History:  Procedure Laterality Date   ANKLE SURGERY     remove extra bone-left   BIOPSY  04/25/2021   Procedure: BIOPSY;  Surgeon: Cindie Carlin POUR, DO;  [facility]: [facility];  Service: Endoscopy;;   CARPAL TUNNEL RELEASE     right side   CARPAL TUNNEL RELEASE Left 02/06/2013   Procedure: CARPAL TUNNEL RELEASE ;  Surgeon: Taft FORBES Minerva, MD;  Location: [facility];  Service: Orthopedics;  Laterality: Left;  procedure end 1135   CARPAL TUNNEL RELEASE Right 09/14/2015   Procedure: RIGHT CARPAL TUNNEL RELEASE;  Surgeon: Taft FORBES Minerva, MD;  Location: [facility];  Service: Orthopedics;  Laterality: Right;   CARPAL TUNNEL RELEASE Left 09/28/2015   Procedure: LEFT CARPAL TUNNEL RELEASE;  Surgeon: Taft FORBES Minerva, MD;  Location: [facility];  Service: Orthopedics;  Laterality: Left;   procedure 1   CESAREAN SECTION     CHOLECYSTECTOMY     COLONOSCOPY WITH PROPOFOL   09/16/2012   DOQ:Qpcz sessile polyps ranging between 3-23mm in size were found in the sigmoid colon and rectum (hyperplastic)/Mild diverticulosis was noted throughout the entire examined colon/Small internal hemorrhoids. Repeat in 10 years.   COLONOSCOPY WITH PROPOFOL  N/A 09/24/2022   Procedure: COLONOSCOPY WITH PROPOFOL ;  Surgeon: Cindie Carlin POUR, DO;  [facility];  Service: Endoscopy;  Laterality: N/A;  12:15pm, asa 3   DILATATION & CURETTAGE/HYSTEROSCOPY WITH MYOSURE N/A 08/05/2019   Procedure: DILATATION & CURETTAGE/HYSTEROSCOPY WITH MYOSURE;  Surgeon: Nicholaus Burnard HERO, MD;  Location: [facility];  Service: Gynecology;  Laterality: N/A;  myosure rep will be here.  Confirmed on 07/30/19 CS   DILATION AND CURETTAGE OF UTERUS     multiple   ESOPHAGOGASTRODUODENOSCOPY  07/29/2009   Mild gastritis, benign path   ESOPHAGOGASTRODUODENOSCOPY (EGD) WITH PROPOFOL   09/16/2012   SLF:Non-erosive gastritis (inflammation) was found; multiple bx/The duodenal mucosa showed no abnormalities in the ampulla and bulb and second portion of the duodenum/NAUSEA/VOMITING MOST LIKELY DUE TO GERD/GASTRITIS   ESOPHAGOGASTRODUODENOSCOPY (EGD) WITH PROPOFOL  N/A 04/25/2021   Surgeon: Cindie Carlin POUR, DO; Gastritis biopsied, normal examined duodenum  biopsied.  Pathology revealed focal chronic gastritis, negative for H. Pylori, benign small bowel mucosa.   FINGER SURGERY Left    left little finger-otif of finger   Gastric Emptying  07/27/2009   The amount of activity in the stomach at 120 minutes was 13% which is in the normal range   LASER ABLATION OF THE CERVIX     LEFT AND RIGHT HEART CATHETERIZATION WITH CORONARY ANGIOGRAM N/A 01/03/2015   Procedure: LEFT AND RIGHT HEART CATHETERIZATION WITH CORONARY ANGIOGRAM;  Surgeon: Ozell JONETTA Fell, MD;  Location: [facility];  Service: Cardiovascular;  Laterality: N/A;    LITHOTRIPSY     POLYPECTOMY  09/16/2012   Procedure: POLYPECTOMY;  Surgeon: Margo LITTIE Haddock, MD;  Location: [facility];  Service: Endoscopy;  Laterality: N/A;   POLYPECTOMY  09/24/2022   Procedure: POLYPECTOMY;  Surgeon: Cindie Carlin POUR, DO;  [facility];  Service: Endoscopy;;   REVERSE SHOULDER ARTHROPLASTY Right 05/07/2024   Procedure: ARTHROPLASTY, SHOULDER, TOTAL, REVERSE;  Surgeon: Onesimo Oneil LABOR, MD;  Location: [facility];  Service: Orthopedics;  Laterality: Right;   SAVORY DILATION  09/16/2012   Procedure: SAVORY DILATION;  Surgeon: Margo LITTIE Haddock, MD;  Location: [facility];  Service: Endoscopy;  Laterality: N/A;  16 fr dilation   TRIGGER FINGER RELEASE Left 02/06/2013   Procedure: RELEASE TRIGGER FINGER LEFT LONG FINGER/A-1 PULLEY;  Surgeon: Taft FORBES Minerva, MD;  Location: [facility];  Service: Orthopedics;  Laterality: Left;  procedure began 1136   TRIGGER FINGER RELEASE Right 09/14/2015   Procedure: RIGHT LONG FINGER TRIGGER RELEASE;  Surgeon: Taft FORBES Minerva, MD;  Location: [facility];  Service: Orthopedics;  Laterality: Right;   TRIGGER FINGER RELEASE Left 09/28/2015   Procedure: LEFT RING TRIGGER FINGER RELEASE;  Surgeon: Taft FORBES Minerva, MD;  Location: [facility];  Service: Orthopedics;  Laterality: Left;  left ring finger   TRIGGER FINGER RELEASE Right 08/07/2023   Procedure: RELEASE TRIGGER FINGER/A-1 PULLEY Right thumb;  Surgeon: Minerva Taft FORBES, MD;  Location: [facility];  Service: Orthopedics;  Laterality: Right;   TUBAL LIGATION     Patient Active Problem List   Diagnosis Date Noted   Vasovagal syncope 06/08/2024   Anemia 06/08/2024   Urinary tract infection without hematuria 06/08/2024   Right rotator cuff tear 05/07/2024   Multiple wounds of skin 09/25/2023   Cellulitis of upper back excluding scapular region 09/25/2023   Trigger finger of right thumb 08/07/2023   Constipation 11/28/2022   Alternating constipation and diarrhea 09/03/2022   Dizziness 04/25/2022    History of colonic polyps 03/14/2022   Long-term current use of opiate analgesic 07/07/2021   Gastroesophageal reflux disease 03/27/2021   Left foot drop 03/17/2020   Former smoker    Chronic cystitis with hematuria 01/22/2020   Nephrolithiasis 01/22/2020   Hyperlipidemia associated with type 2 diabetes mellitus (HCC) 12/08/2019   Morbid obesity due to excess calories (HCC) 09/05/2015   Chronic pain syndrome 03/30/2015   History of CVA (cerebrovascular accident) without residual deficits 12/23/2014   Dyspnea on exertion 11/29/2014   Lumbar pain with radiation down left leg 02/16/2013   Gastroparesis 07/19/2009   Type 2 diabetes mellitus with vascular disease (HCC) 07/15/2009   ANXIETY 07/15/2009   Anxiety and depression 07/15/2009   Essential hypertension 07/15/2009   HIATAL HERNIA 07/15/2009   Fatty liver 07/15/2009    PCP: Alphonsa Hamilton, MD REFERRING PROVIDER: Onesimo Oneil, MD  ONSET DATE: 05/07/24  REFERRING DIAG: R Reverse Shoulder Arthroplasty  THERAPY DIAG:  Acute pain of right shoulder  Shoulder stiffness, right  Other symptoms and signs involving the musculoskeletal system  Rationale for Evaluation and Treatment: Rehabilitation  SUBJECTIVE:   SUBJECTIVE STATEMENT: The doctor said I was doing really well and could start moving my arm more.   PERTINENT HISTORY: Pt s/p R Reverse Shoulder Arthroplasty on 05/07/24  PRECAUTIONS: Shoulder - See protocol  WEIGHT BEARING RESTRICTIONS: Yes >1#  PAIN:  Are you having pain? No  FALLS: Has patient fallen in last 6 months? Yes. Number of falls 5  PLOF: Independent  PATIENT GOALS: To improve mobility  NEXT MD VISIT: 06/16/24  OBJECTIVE:   HAND DOMINANCE: Right  ADLs: Overall ADLs: Pt having most problems with lifting and carrying items for cooking and cleaning.   FUNCTIONAL OUTCOME MEASURES: Upper Extremity Functional Scale (UEFS): 44/80 - 55%  UPPER EXTREMITY ROM:       Assessed in supine, er/IR  adducted  Passive ROM Right eval  Shoulder flexion 151  Shoulder abduction 153  Shoulder internal rotation 90  Shoulder external rotation 41  (Blank rows = not tested)    UPPER EXTREMITY MMT:     Assessed in seated, er/IR adducted  MMT Right eval  Shoulder flexion   Shoulder abduction   Shoulder internal rotation   Shoulder external rotation   (Blank rows = not tested)    OBSERVATIONS: Moderate fascial restrictions along biceps and trapezius.    TODAY'S TREATMENT:                                                                                                                              DATE:   06/17/24 -Manual Therapy: myofascial release and trigger point applied to the biceps, triceps, subscapular region, and trapezius, in order to reduce pain and fascial restrictions, as well as improve ROM.  -P/ROM: supine-flexion, abduction, er/IR, x8 -AA/ROM: supine-protraction, flexion, abduction, protraction, horizontal abduction, er/IR, x10 -A/ROM: supine- protraction, flexion, er/IR, abduction, horizontal abduction, x10 -UBE: level 1, 2' forwards and backwards  06/15/24 -Manual Therapy: myofascial release and trigger point applied to the biceps, triceps, subscapular region, and trapezius, in order to reduce pain and fascial restrictions, as well as improve ROM.  -P/ROM: supine-flexion, abduction, er/IR, 10 reps -AA/ROM: supine-protraction, flexion, abduction, protraction, horizontal abduction, er/IR, 10 reps -Wall Slides: flexion, abduction, x10 -Pulleys: flexion, abduction, x60  06/09/24 -Manual Therapy: myofascial release and trigger point applied to the biceps, triceps, subscapular region, and trapezius, in order to reduce pain and fascial restrictions, as well as improve ROM.  -P/ROM: supine-flexion, abduction, er/IR, 10 reps -AA/ROM: supine-protraction, flexion, abduction, protraction, horizontal abduction, er/IR, 10 reps -AA/ROM: sitting- protraction, flexion, er,  abduction, horizontal abduction, 10 reps -Wall wash: 1'  -Thumb tacks: 1', shoulder height   PATIENT EDUCATION: Education details: A/ROM Person educated: Patient Education method: Programmer, multimedia, Demonstration, and Handouts Education comprehension: verbalized understanding and returned demonstration  HOME EXERCISE PROGRAM: 7/11: Table Slides and Pendulums 7/16: AA/ROM 7/31: A/ROM  GOALS: Goals  reviewed with patient? Yes   SHORT TERM GOALS: Target date: 06/18/24  Pt will be provided with and educated on HEP to improve mobility in RUE required for use during ADL completion.   Goal status: IN PROGRESS  3.  Pt will increase RUE strength to 4-/5 to improve ability to reach for items at waist to chest height during bathing and grooming tasks.   Goal status: IN PROGRESS   LONG TERM GOALS: Target date: 07/10/24  Pt will decrease pain in RUE to 3/10 or less to improve ability to sleep for 2+ consecutive hours without waking due to pain.   Goal status: IN PROGRESS  2.  Pt will decrease RUE fascial restrictions to min amounts or less to improve mobility required for functional reaching tasks.   Goal status: IN PROGRESS  3.  Pt will increase RUE A/ROM by 10 degrees to improve ability to use RUE when reaching overhead or behind back during dressing and bathing tasks.   Goal status: IN PROGRESS  4.  Pt will increase RUE strength to 5/5 or greater to improve ability to use RUE when lifting or carrying items during meal preparation/housework/yardwork tasks.   Goal status: IN PROGRESS  5.  Pt will return to highest level of function using RUE as dominant during functional task completion.   Goal status: IN PROGRESS   ASSESSMENT:  CLINICAL IMPRESSION: This session pt is demonstrating improving ROM and strength. She was able to start A/ROM, where she completed 85% of full ROM. Additionally she was able to complete UBE task, which she reported helped her feel more loose and pain free.  OT providing verbal and tactile cuing for positioning and technique.   PERFORMANCE DEFICITS: in functional skills including in functional skills including ADLs, IADLs, coordination, tone, ROM, strength, pain, fascial restrictions, muscle spasms, and UE functional use.   PLAN:  OT FREQUENCY: 2x/week  OT DURATION: 6 weeks  PLANNED INTERVENTIONS: 97168 OT Re-evaluation, 97535 self care/ADL training, 02889 therapeutic exercise, 97530 therapeutic activity, 97112 neuromuscular re-education, 97140 manual therapy, 97010 moist heat, 97032 electrical stimulation (manual), passive range of motion, functional mobility training, energy conservation, coping strategies training, patient/family education, and DME and/or AE instructions  CONSULTED AND AGREED WITH PLAN OF CARE: Patient  PLAN FOR NEXT SESSION: follow up on HEP, manual techniques, AA/ROM, functional reaching   Valentin Nightingale, OTR/L [phone] 06/18/2024, 10:26 AM

## 2024-06-23 ENCOUNTER — Encounter (HOSPITAL_COMMUNITY): Payer: Self-pay

## 2024-06-23 ENCOUNTER — Encounter (HOSPITAL_COMMUNITY): Admitting: Occupational Therapy

## 2024-06-23 ENCOUNTER — Telehealth (HOSPITAL_COMMUNITY): Payer: Self-pay | Admitting: Occupational Therapy

## 2024-06-23 NOTE — Telephone Encounter (Signed)
 Called pt regarding no-show for 145 appt on 8/5. Straight to vm, left message informing pt of next appt and asking to return the call if she wants to reschedule today's appt.    Sonny Cory, OTR/L  [phone] 06/23/24

## 2024-06-25 ENCOUNTER — Ambulatory Visit (HOSPITAL_COMMUNITY): Attending: Orthopedic Surgery | Admitting: Occupational Therapy

## 2024-06-25 ENCOUNTER — Encounter (HOSPITAL_COMMUNITY): Payer: Self-pay | Admitting: Occupational Therapy

## 2024-06-25 DIAGNOSIS — R29898 Other symptoms and signs involving the musculoskeletal system: Secondary | ICD-10-CM | POA: Diagnosis not present

## 2024-06-25 DIAGNOSIS — M25511 Pain in right shoulder: Secondary | ICD-10-CM | POA: Diagnosis not present

## 2024-06-25 DIAGNOSIS — M25611 Stiffness of right shoulder, not elsewhere classified: Secondary | ICD-10-CM | POA: Diagnosis not present

## 2024-06-25 NOTE — Therapy (Unsigned)
 OUTPATIENT OCCUPATIONAL THERAPY ORTHO TREATMENT NOTE  Patient Name: Jamie Harding MRN: [id number] DOB:12-14-1955, 68 y.o., female Today's Date: 06/26/2024   END OF SESSION:   OT End of Session - 06/25/24 1533     Visit Number 7    Number of Visits 12    Date for OT Re-Evaluation 07/10/24    Authorization Type [employer]    OT Start Time 1454    OT Stop Time 1533    OT Time Calculation (min) 39 min    Activity Tolerance Patient tolerated treatment well    Behavior During Therapy [facility] for tasks assessed/performed           Past Medical History:  Diagnosis Date   Anxiety    Asthma    Depression    Essential hypertension    Fatty liver    Gastritis    Gastroparesis    GERD (gastroesophageal reflux disease)    Hiatal hernia    History of cardiac catheterization    Minimal coronary atherosclerosis February 2016   History of kidney stones    History of stroke 2008   HLD (hyperlipidemia)    Neuropathy    Obesity    Pancreatitis    Rheumatoid arthritis(714.0)    Stroke (HCC) 2008   Stroke (HCC) 01/2020   Tibialis tendinitis    Type 2 diabetes mellitus (HCC)    Past Surgical History:  Procedure Laterality Date   ANKLE SURGERY     remove extra bone-left   BIOPSY  04/25/2021   Procedure: BIOPSY;  Surgeon: Cindie Carlin POUR, DO;  [facility];  Service: Endoscopy;;   CARPAL TUNNEL RELEASE     right side   CARPAL TUNNEL RELEASE Left 02/06/2013   Procedure: CARPAL TUNNEL RELEASE ;  Surgeon: Taft FORBES Minerva, MD;  Location: [facility];  Service: Orthopedics;  Laterality: Left;  procedure end 1135   CARPAL TUNNEL RELEASE Right 09/14/2015   Procedure: RIGHT CARPAL TUNNEL RELEASE;  Surgeon: Taft FORBES Minerva, MD;  Location: [facility];  Service: Orthopedics;  Laterality: Right;   CARPAL TUNNEL RELEASE Left 09/28/2015   Procedure: LEFT CARPAL TUNNEL RELEASE;  Surgeon: Taft FORBES Minerva, MD;  Location: [facility];  Service: Orthopedics;  Laterality: Left;   procedure 1   CESAREAN SECTION     CHOLECYSTECTOMY     COLONOSCOPY WITH PROPOFOL   09/16/2012   DOQ:Qpcz sessile polyps ranging between 3-64mm in size were found in the sigmoid colon and rectum (hyperplastic)/Mild diverticulosis was noted throughout the entire examined colon/Small internal hemorrhoids. Repeat in 10 years.   COLONOSCOPY WITH PROPOFOL  N/A 09/24/2022   Procedure: COLONOSCOPY WITH PROPOFOL ;  Surgeon: Cindie Carlin POUR, DO;  [facility];  Service: Endoscopy;  Laterality: N/A;  12:15pm, asa 3   DILATATION & CURETTAGE/HYSTEROSCOPY WITH MYOSURE N/A 08/05/2019   Procedure: DILATATION & CURETTAGE/HYSTEROSCOPY WITH MYOSURE;  Surgeon: Nicholaus Burnard HERO, MD;  Location: [facility];  Service: Gynecology;  Laterality: N/A;  myosure rep will be here.  Confirmed on 07/30/19 CS   DILATION AND CURETTAGE OF UTERUS     multiple   ESOPHAGOGASTRODUODENOSCOPY  07/29/2009   Mild gastritis, benign path   ESOPHAGOGASTRODUODENOSCOPY (EGD) WITH PROPOFOL   09/16/2012   SLF:Non-erosive gastritis (inflammation) was found; multiple bx/The duodenal mucosa showed no abnormalities in the ampulla and bulb and second portion of the duodenum/NAUSEA/VOMITING MOST LIKELY DUE TO GERD/GASTRITIS   ESOPHAGOGASTRODUODENOSCOPY (EGD) WITH PROPOFOL  N/A 04/25/2021   Surgeon: Cindie Carlin POUR, DO; Gastritis biopsied, normal examined  duodenum biopsied.  Pathology revealed focal chronic gastritis, negative for H. Pylori, benign small bowel mucosa.   FINGER SURGERY Left    left little finger-otif of finger   Gastric Emptying  07/27/2009   The amount of activity in the stomach at 120 minutes was 13% which is in the normal range   LASER ABLATION OF THE CERVIX     LEFT AND RIGHT HEART CATHETERIZATION WITH CORONARY ANGIOGRAM N/A 01/03/2015   Procedure: LEFT AND RIGHT HEART CATHETERIZATION WITH CORONARY ANGIOGRAM;  Surgeon: Ozell JONETTA Fell, MD;  Location: [facility];  Service: Cardiovascular;  Laterality: N/A;    LITHOTRIPSY     POLYPECTOMY  09/16/2012   Procedure: POLYPECTOMY;  Surgeon: Margo LITTIE Haddock, MD;  Location: [facility];  Service: Endoscopy;  Laterality: N/A;   POLYPECTOMY  09/24/2022   Procedure: POLYPECTOMY;  Surgeon: Cindie Carlin POUR, DO;  [facility];  Service: Endoscopy;;   REVERSE SHOULDER ARTHROPLASTY Right 05/07/2024   Procedure: ARTHROPLASTY, SHOULDER, TOTAL, REVERSE;  Surgeon: Onesimo Oneil LABOR, MD;  Location: [facility];  Service: Orthopedics;  Laterality: Right;   SAVORY DILATION  09/16/2012   Procedure: SAVORY DILATION;  Surgeon: Margo LITTIE Haddock, MD;  Location: [facility];  Service: Endoscopy;  Laterality: N/A;  16 fr dilation   TRIGGER FINGER RELEASE Left 02/06/2013   Procedure: RELEASE TRIGGER FINGER LEFT LONG FINGER/A-1 PULLEY;  Surgeon: Taft FORBES Minerva, MD;  Location: [facility];  Service: Orthopedics;  Laterality: Left;  procedure began 1136   TRIGGER FINGER RELEASE Right 09/14/2015   Procedure: RIGHT LONG FINGER TRIGGER RELEASE;  Surgeon: Taft FORBES Minerva, MD;  Location: [facility];  Service: Orthopedics;  Laterality: Right;   TRIGGER FINGER RELEASE Left 09/28/2015   Procedure: LEFT RING TRIGGER FINGER RELEASE;  Surgeon: Taft FORBES Minerva, MD;  Location: [facility];  Service: Orthopedics;  Laterality: Left;  left ring finger   TRIGGER FINGER RELEASE Right 08/07/2023   Procedure: RELEASE TRIGGER FINGER/A-1 PULLEY Right thumb;  Surgeon: Minerva Taft FORBES, MD;  Location: [facility];  Service: Orthopedics;  Laterality: Right;   TUBAL LIGATION     Patient Active Problem List   Diagnosis Date Noted   Vasovagal syncope 06/08/2024   Anemia 06/08/2024   Urinary tract infection without hematuria 06/08/2024   Right rotator cuff tear 05/07/2024   Multiple wounds of skin 09/25/2023   Cellulitis of upper back excluding scapular region 09/25/2023   Trigger finger of right thumb 08/07/2023   Constipation 11/28/2022   Alternating constipation and diarrhea 09/03/2022   Dizziness 04/25/2022    History of colonic polyps 03/14/2022   Long-term current use of opiate analgesic 07/07/2021   Gastroesophageal reflux disease 03/27/2021   Left foot drop 03/17/2020   Former smoker    Chronic cystitis with hematuria 01/22/2020   Nephrolithiasis 01/22/2020   Hyperlipidemia associated with type 2 diabetes mellitus (HCC) 12/08/2019   Morbid obesity due to excess calories (HCC) 09/05/2015   Chronic pain syndrome 03/30/2015   History of CVA (cerebrovascular accident) without residual deficits 12/23/2014   Dyspnea on exertion 11/29/2014   Lumbar pain with radiation down left leg 02/16/2013   Gastroparesis 07/19/2009   Type 2 diabetes mellitus with vascular disease (HCC) 07/15/2009   ANXIETY 07/15/2009   Anxiety and depression 07/15/2009   Essential hypertension 07/15/2009   HIATAL HERNIA 07/15/2009   Fatty liver 07/15/2009    PCP: Alphonsa Hamilton, MD REFERRING PROVIDER: Onesimo Oneil, MD  ONSET DATE: 05/07/24  REFERRING DIAG: R Reverse Shoulder Arthroplasty  THERAPY DIAG:  Acute pain of right shoulder  Shoulder stiffness, right  Other symptoms and signs involving the musculoskeletal system  Rationale for Evaluation and Treatment: Rehabilitation  SUBJECTIVE:   SUBJECTIVE STATEMENT: I'm real sore today.SABRA   PERTINENT HISTORY: Pt s/p R Reverse Shoulder Arthroplasty on 05/07/24  PRECAUTIONS: Shoulder - See protocol  WEIGHT BEARING RESTRICTIONS: Yes >1#  PAIN:  Are you having pain? No  FALLS: Has patient fallen in last 6 months? Yes. Number of falls 5  PLOF: Independent  PATIENT GOALS: To improve mobility  NEXT MD VISIT: 06/16/24  OBJECTIVE:   HAND DOMINANCE: Right  ADLs: Overall ADLs: Pt having most problems with lifting and carrying items for cooking and cleaning.   FUNCTIONAL OUTCOME MEASURES: Upper Extremity Functional Scale (UEFS): 44/80 - 55%  UPPER EXTREMITY ROM:       Assessed in supine, er/IR adducted  Passive ROM Right eval  Shoulder flexion 151   Shoulder abduction 153  Shoulder internal rotation 90  Shoulder external rotation 41  (Blank rows = not tested)    UPPER EXTREMITY MMT:     Assessed in seated, er/IR adducted  MMT Right eval  Shoulder flexion   Shoulder abduction   Shoulder internal rotation   Shoulder external rotation   (Blank rows = not tested)    OBSERVATIONS: Moderate fascial restrictions along biceps and trapezius.    TODAY'S TREATMENT:                                                                                                                              DATE:   06/25/24 -Manual Therapy: myofascial release and trigger point applied to the biceps, triceps, subscapular region, and trapezius, in order to reduce pain and fascial restrictions, as well as improve ROM.  -A/ROM: supine- protraction, flexion, er/IR, abduction, horizontal abduction, x15 -X to V arms x 15 -Goal Post arms x15 -Scapular Strengthening: red band, rows, retraction, extension, x15  06/17/24 -Manual Therapy: myofascial release and trigger point applied to the biceps, triceps, subscapular region, and trapezius, in order to reduce pain and fascial restrictions, as well as improve ROM.  -P/ROM: supine-flexion, abduction, er/IR, x8 -AA/ROM: supine-protraction, flexion, abduction, protraction, horizontal abduction, er/IR, x10 -A/ROM: supine- protraction, flexion, er/IR, abduction, horizontal abduction, x10 -UBE: level 1, 2' forwards and backwards  06/15/24 -Manual Therapy: myofascial release and trigger point applied to the biceps, triceps, subscapular region, and trapezius, in order to reduce pain and fascial restrictions, as well as improve ROM.  -P/ROM: supine-flexion, abduction, er/IR, 10 reps -AA/ROM: supine-protraction, flexion, abduction, protraction, horizontal abduction, er/IR, 10 reps -Wall Slides: flexion, abduction, x10 -Pulleys: flexion, abduction, x60   PATIENT EDUCATION: Education details: Scientific laboratory technician Person educated: Patient Education method: Explanation, Demonstration, and Handouts Education comprehension: verbalized understanding and returned demonstration  HOME EXERCISE PROGRAM: 7/11: Table Slides and Pendulums 7/16: AA/ROM 7/31: A/ROM 8/8: Scapular Strengthening  GOALS: Goals reviewed with patient? Yes   SHORT TERM GOALS: Target date: 06/18/24  Pt will  be provided with and educated on HEP to improve mobility in RUE required for use during ADL completion.   Goal status: IN PROGRESS  3.  Pt will increase RUE strength to 4-/5 to improve ability to reach for items at waist to chest height during bathing and grooming tasks.   Goal status: IN PROGRESS   LONG TERM GOALS: Target date: 07/10/24  Pt will decrease pain in RUE to 3/10 or less to improve ability to sleep for 2+ consecutive hours without waking due to pain.   Goal status: IN PROGRESS  2.  Pt will decrease RUE fascial restrictions to min amounts or less to improve mobility required for functional reaching tasks.   Goal status: IN PROGRESS  3.  Pt will increase RUE A/ROM by 10 degrees to improve ability to use RUE when reaching overhead or behind back during dressing and bathing tasks.   Goal status: IN PROGRESS  4.  Pt will increase RUE strength to 5/5 or greater to improve ability to use RUE when lifting or carrying items during meal preparation/housework/yardwork tasks.   Goal status: IN PROGRESS  5.  Pt will return to highest level of function using RUE as dominant during functional task completion.   Goal status: IN PROGRESS   ASSESSMENT:  CLINICAL IMPRESSION: This session pt continuing to demonstrate overall great improvement with ROM and strength. She is achieving functional ROM actively at this time with minimal discomfort. Additionally, OT was able to initiate scapular strengthening for overall improved strength and mobility. Verbal and tactile cuing provided for positioning and  technique throughout session.   PERFORMANCE DEFICITS: in functional skills including in functional skills including ADLs, IADLs, coordination, tone, ROM, strength, pain, fascial restrictions, muscle spasms, and UE functional use.   PLAN:  OT FREQUENCY: 2x/week  OT DURATION: 6 weeks  PLANNED INTERVENTIONS: 97168 OT Re-evaluation, 97535 self care/ADL training, 02889 therapeutic exercise, 97530 therapeutic activity, 97112 neuromuscular re-education, 97140 manual therapy, 97010 moist heat, 97032 electrical stimulation (manual), passive range of motion, functional mobility training, energy conservation, coping strategies training, patient/family education, and DME and/or AE instructions  CONSULTED AND AGREED WITH PLAN OF CARE: Patient  PLAN FOR NEXT SESSION: follow up on HEP, manual techniques, AA/ROM, functional reaching   Valentin Nightingale, OTR/L [phone] 06/26/2024, 2:24 PM

## 2024-06-25 NOTE — Patient Instructions (Signed)

## 2024-06-30 ENCOUNTER — Ambulatory Visit (HOSPITAL_COMMUNITY): Admitting: Occupational Therapy

## 2024-06-30 ENCOUNTER — Encounter (HOSPITAL_COMMUNITY): Payer: Self-pay | Admitting: Occupational Therapy

## 2024-06-30 DIAGNOSIS — R29898 Other symptoms and signs involving the musculoskeletal system: Secondary | ICD-10-CM

## 2024-06-30 DIAGNOSIS — M25611 Stiffness of right shoulder, not elsewhere classified: Secondary | ICD-10-CM

## 2024-06-30 DIAGNOSIS — M25511 Pain in right shoulder: Secondary | ICD-10-CM | POA: Diagnosis not present

## 2024-06-30 NOTE — Therapy (Signed)
 OUTPATIENT OCCUPATIONAL THERAPY ORTHO TREATMENT NOTE  Patient Name: Jamie Harding MRN: [id number] DOB:1956/10/14, 68 y.o., female Today's Date: 07/01/2024   END OF SESSION:   OT End of Session - 06/30/24 1434     Visit Number 8    Number of Visits 12    Date for OT Re-Evaluation 07/10/24    Authorization Type [employer]    OT Start Time 1353    OT Stop Time 1434    OT Time Calculation (min) 41 min    Activity Tolerance Patient tolerated treatment well    Behavior During Therapy [facility] for tasks assessed/performed          Past Medical History:  Diagnosis Date   Anxiety    Asthma    Depression    Essential hypertension    Fatty liver    Gastritis    Gastroparesis    GERD (gastroesophageal reflux disease)    Hiatal hernia    History of cardiac catheterization    Minimal coronary atherosclerosis February 2016   History of kidney stones    History of stroke 2008   HLD (hyperlipidemia)    Neuropathy    Obesity    Pancreatitis    Rheumatoid arthritis(714.0)    Stroke (HCC) 2008   Stroke (HCC) 01/2020   Tibialis tendinitis    Type 2 diabetes mellitus (HCC)    Past Surgical History:  Procedure Laterality Date   ANKLE SURGERY     remove extra bone-left   BIOPSY  04/25/2021   Procedure: BIOPSY;  Surgeon: Cindie Carlin POUR, DO;  Location: [facility];  Service: Endoscopy;;   CARPAL TUNNEL RELEASE     right side   CARPAL TUNNEL RELEASE Left 02/06/2013   Procedure: CARPAL TUNNEL RELEASE ;  Surgeon: Taft FORBES Minerva, MD;  Location: [facility];  Service: Orthopedics;  Laterality: Left;  procedure end 1135   CARPAL TUNNEL RELEASE Right 09/14/2015   Procedure: RIGHT CARPAL TUNNEL RELEASE;  Surgeon: Taft FORBES Minerva, MD;  Location: [facility];  Service: Orthopedics;  Laterality: Right;   CARPAL TUNNEL RELEASE Left 09/28/2015   Procedure: LEFT CARPAL TUNNEL RELEASE;  Surgeon: Taft FORBES Minerva, MD;  Location: [facility];  Service: Orthopedics;  Laterality: Left;   procedure 1   CESAREAN SECTION     CHOLECYSTECTOMY     COLONOSCOPY WITH PROPOFOL   09/16/2012   DOQ:Qpcz sessile polyps ranging between 3-77mm in size were found in the sigmoid colon and rectum (hyperplastic)/Mild diverticulosis was noted throughout the entire examined colon/Small internal hemorrhoids. Repeat in 10 years.   COLONOSCOPY WITH PROPOFOL  N/A 09/24/2022   Procedure: COLONOSCOPY WITH PROPOFOL ;  Surgeon: Cindie Carlin POUR, DO;  [facility]: [facility];  Service: Endoscopy;  Laterality: N/A;  12:15pm, asa 3   DILATATION & CURETTAGE/HYSTEROSCOPY WITH MYOSURE N/A 08/05/2019   Procedure: DILATATION & CURETTAGE/HYSTEROSCOPY WITH MYOSURE;  Surgeon: Nicholaus Burnard HERO, MD;  Location: [facility];  Service: Gynecology;  Laterality: N/A;  myosure rep will be here.  Confirmed on 07/30/19 CS   DILATION AND CURETTAGE OF UTERUS     multiple   ESOPHAGOGASTRODUODENOSCOPY  07/29/2009   Mild gastritis, benign path   ESOPHAGOGASTRODUODENOSCOPY (EGD) WITH PROPOFOL   09/16/2012   SLF:Non-erosive gastritis (inflammation) was found; multiple bx/The duodenal mucosa showed no abnormalities in the ampulla and bulb and second portion of the duodenum/NAUSEA/VOMITING MOST LIKELY DUE TO GERD/GASTRITIS   ESOPHAGOGASTRODUODENOSCOPY (EGD) WITH PROPOFOL  N/A 04/25/2021   Surgeon: Cindie Carlin POUR, DO; Gastritis biopsied, normal examined duodenum  biopsied.  Pathology revealed focal chronic gastritis, negative for H. Pylori, benign small bowel mucosa.   FINGER SURGERY Left    left little finger-otif of finger   Gastric Emptying  07/27/2009   The amount of activity in the stomach at 120 minutes was 13% which is in the normal range   LASER ABLATION OF THE CERVIX     LEFT AND RIGHT HEART CATHETERIZATION WITH CORONARY ANGIOGRAM N/A 01/03/2015   Procedure: LEFT AND RIGHT HEART CATHETERIZATION WITH CORONARY ANGIOGRAM;  Surgeon: Ozell JONETTA Fell, MD;  Location[facility];  Service: Cardiovascular;  Laterality: N/A;    LITHOTRIPSY     POLYPECTOMY  09/16/2012   Procedure: POLYPECTOMY;  Surgeon: Margo LITTIE Haddock, MD;  Location: [facility];  Service: Endoscopy;  Laterality: N/A;   POLYPECTOMY  09/24/2022   Procedure: POLYPECTOMY;  Surgeon: Cindie Carlin POUR, DO;  [facility];  Service: Endoscopy;;   REVERSE SHOULDER ARTHROPLASTY Right 05/07/2024   Procedure: ARTHROPLASTY, SHOULDER, TOTAL, REVERSE;  Surgeon: Onesimo Oneil LABOR, MD;  Location: [facility];  Service: Orthopedics;  Laterality: Right;   SAVORY DILATION  09/16/2012   Procedure: SAVORY DILATION;  Surgeon: Margo LITTIE Haddock, MD;  Location: [facility];  Service: Endoscopy;  Laterality: N/A;  16 fr dilation   TRIGGER FINGER RELEASE Left 02/06/2013   Procedure: RELEASE TRIGGER FINGER LEFT LONG FINGER/A-1 PULLEY;  Surgeon: Taft FORBES Minerva, MD;  Location: [facility];  Service: Orthopedics;  Laterality: Left;  procedure began 1136   TRIGGER FINGER RELEASE Right 09/14/2015   Procedure: RIGHT LONG FINGER TRIGGER RELEASE;  Surgeon: Taft FORBES Minerva, MD;  Location: [facility];  Service: Orthopedics;  Laterality: Right;   TRIGGER FINGER RELEASE Left 09/28/2015   Procedure: LEFT RING TRIGGER FINGER RELEASE;  Surgeon: Taft FORBES Minerva, MD;  Location: [facility];  Service: Orthopedics;  Laterality: Left;  left ring finger   TRIGGER FINGER RELEASE Right 08/07/2023   Procedure: RELEASE TRIGGER FINGER/A-1 PULLEY Right thumb;  Surgeon: Minerva Taft FORBES, MD;  Location: [facility];  Service: Orthopedics;  Laterality: Right;   TUBAL LIGATION     Patient Active Problem List   Diagnosis Date Noted   Vasovagal syncope 06/08/2024   Anemia 06/08/2024   Urinary tract infection without hematuria 06/08/2024   Right rotator cuff tear 05/07/2024   Multiple wounds of skin 09/25/2023   Cellulitis of upper back excluding scapular region 09/25/2023   Trigger finger of right thumb 08/07/2023   Constipation 11/28/2022   Alternating constipation and diarrhea 09/03/2022   Dizziness 04/25/2022    History of colonic polyps 03/14/2022   Long-term current use of opiate analgesic 07/07/2021   Gastroesophageal reflux disease 03/27/2021   Left foot drop 03/17/2020   Former smoker    Chronic cystitis with hematuria 01/22/2020   Nephrolithiasis 01/22/2020   Hyperlipidemia associated with type 2 diabetes mellitus (HCC) 12/08/2019   Morbid obesity due to excess calories (HCC) 09/05/2015   Chronic pain syndrome 03/30/2015   History of CVA (cerebrovascular accident) without residual deficits 12/23/2014   Dyspnea on exertion 11/29/2014   Lumbar pain with radiation down left leg 02/16/2013   Gastroparesis 07/19/2009   Type 2 diabetes mellitus with vascular disease (HCC) 07/15/2009   ANXIETY 07/15/2009   Anxiety and depression 07/15/2009   Essential hypertension 07/15/2009   HIATAL HERNIA 07/15/2009   Fatty liver 07/15/2009    PCP: Alphonsa Hamilton, MD REFERRING PROVIDER: Onesimo Oneil, MD  ONSET DATE: 05/07/24  REFERRING DIAG: R Reverse Shoulder Arthroplasty  THERAPY DIAG:  Acute pain of right shoulder  Shoulder stiffness, right  Other symptoms and signs involving the musculoskeletal system  Rationale for Evaluation and Treatment: Rehabilitation  SUBJECTIVE:   SUBJECTIVE STATEMENT: I feel good today   PERTINENT HISTORY: Pt s/p R Reverse Shoulder Arthroplasty on 05/07/24  PRECAUTIONS: Shoulder - See protocol  WEIGHT BEARING RESTRICTIONS: Yes >1#  PAIN:  Are you having pain? No  FALLS: Has patient fallen in last 6 months? Yes. Number of falls 5  PLOF: Independent  PATIENT GOALS: To improve mobility  NEXT MD VISIT: 07/28/24  OBJECTIVE:   HAND DOMINANCE: Right  ADLs: Overall ADLs: Pt having most problems with lifting and carrying items for cooking and cleaning.   FUNCTIONAL OUTCOME MEASURES: Upper Extremity Functional Scale (UEFS): 44/80 - 55%  UPPER EXTREMITY ROM:       Assessed in supine, er/IR adducted  Passive ROM Right eval  Shoulder flexion 151   Shoulder abduction 153  Shoulder internal rotation 90  Shoulder external rotation 41  (Blank rows = not tested)    UPPER EXTREMITY MMT:     Assessed in seated, er/IR adducted  MMT Right eval  Shoulder flexion   Shoulder abduction   Shoulder internal rotation   Shoulder external rotation   (Blank rows = not tested)    OBSERVATIONS: Moderate fascial restrictions along biceps and trapezius.    TODAY'S TREATMENT:                                                                                                                              DATE:   06/30/24 -Manual Therapy: myofascial release and trigger point applied to the biceps, triceps, subscapular region, and trapezius, in order to reduce pain and fascial restrictions, as well as improve ROM.  -A/ROM: supine- protraction, flexion, er/IR, abduction, horizontal abduction, x15 -X to V arms x 15 -Goal Post arms x15 -Scapular Strengthening: red band, rows, retraction, extension, x15 -UBE: level 1, 2.5' forwards and backwards  06/25/24 -Manual Therapy: myofascial release and trigger point applied to the biceps, triceps, subscapular region, and trapezius, in order to reduce pain and fascial restrictions, as well as improve ROM.  -A/ROM: supine- protraction, flexion, er/IR, abduction, horizontal abduction, x15 -X to V arms x 15 -Goal Post arms x15 -Scapular Strengthening: red band, rows, retraction, extension, x15  06/17/24 -Manual Therapy: myofascial release and trigger point applied to the biceps, triceps, subscapular region, and trapezius, in order to reduce pain and fascial restrictions, as well as improve ROM.  -P/ROM: supine-flexion, abduction, er/IR, x8 -AA/ROM: supine-protraction, flexion, abduction, protraction, horizontal abduction, er/IR, x10 -A/ROM: supine- protraction, flexion, er/IR, abduction, horizontal abduction, x10 -UBE: level 1, 2' forwards and backwards   PATIENT EDUCATION: Education details: Scientific laboratory technician Person educated: Patient Education method: Explanation, Demonstration, and Handouts Education comprehension: verbalized understanding and returned demonstration  HOME EXERCISE PROGRAM: 7/11: Table Slides and Pendulums 7/16: AA/ROM 7/31: A/ROM 8/8: Scapular Strengthening  GOALS: Goals reviewed with patient? Yes  SHORT TERM GOALS: Target date: 06/18/24  Pt will be provided with and educated on HEP to improve mobility in RUE required for use during ADL completion.   Goal status: IN PROGRESS  3.  Pt will increase RUE strength to 4-/5 to improve ability to reach for items at waist to chest height during bathing and grooming tasks.   Goal status: IN PROGRESS   LONG TERM GOALS: Target date: 07/10/24  Pt will decrease pain in RUE to 3/10 or less to improve ability to sleep for 2+ consecutive hours without waking due to pain.   Goal status: IN PROGRESS  2.  Pt will decrease RUE fascial restrictions to min amounts or less to improve mobility required for functional reaching tasks.   Goal status: IN PROGRESS  3.  Pt will increase RUE A/ROM by 10 degrees to improve ability to use RUE when reaching overhead or behind back during dressing and bathing tasks.   Goal status: IN PROGRESS  4.  Pt will increase RUE strength to 5/5 or greater to improve ability to use RUE when lifting or carrying items during meal preparation/housework/yardwork tasks.   Goal status: IN PROGRESS  5.  Pt will return to highest level of function using RUE as dominant during functional task completion.   Goal status: IN PROGRESS   ASSESSMENT:  CLINICAL IMPRESSION: Pt continuing to make good improvements following her HEP. Her ROM is approximately 85% of full range actively. She continues to do well with low level theraband exercises and is experiencing minimal pain. Pt continuing to follow her protocol, with light lifting and full mobility. Verbal and tactile cuing provided for positioning and  technique.    PERFORMANCE DEFICITS: in functional skills including in functional skills including ADLs, IADLs, coordination, tone, ROM, strength, pain, fascial restrictions, muscle spasms, and UE functional use.   PLAN:  OT FREQUENCY: 2x/week  OT DURATION: 6 weeks  PLANNED INTERVENTIONS: 97168 OT Re-evaluation, 97535 self care/ADL training, 02889 therapeutic exercise, 97530 therapeutic activity, 97112 neuromuscular re-education, 97140 manual therapy, 97010 moist heat, 97032 electrical stimulation (manual), passive range of motion, functional mobility training, energy conservation, coping strategies training, patient/family education, and DME and/or AE instructions  CONSULTED AND AGREED WITH PLAN OF CARE: Patient  PLAN FOR NEXT SESSION: follow up on HEP, manual techniques, AA/ROM, functional reaching   Valentin Nightingale, OTR/L [phone] 07/01/2024, 2:19 PM

## 2024-07-02 ENCOUNTER — Encounter (HOSPITAL_COMMUNITY): Payer: Self-pay

## 2024-07-02 ENCOUNTER — Encounter (HOSPITAL_COMMUNITY): Admitting: Occupational Therapy

## 2024-07-07 ENCOUNTER — Encounter (HOSPITAL_COMMUNITY): Payer: Self-pay

## 2024-07-07 ENCOUNTER — Encounter (HOSPITAL_COMMUNITY): Admitting: Occupational Therapy

## 2024-07-09 ENCOUNTER — Ambulatory Visit (HOSPITAL_COMMUNITY): Admitting: Occupational Therapy

## 2024-07-09 ENCOUNTER — Telehealth (HOSPITAL_COMMUNITY): Payer: Self-pay | Admitting: Occupational Therapy

## 2024-07-09 ENCOUNTER — Encounter (HOSPITAL_COMMUNITY): Payer: Self-pay | Admitting: Occupational Therapy

## 2024-07-09 DIAGNOSIS — M25511 Pain in right shoulder: Secondary | ICD-10-CM | POA: Diagnosis not present

## 2024-07-09 DIAGNOSIS — M25611 Stiffness of right shoulder, not elsewhere classified: Secondary | ICD-10-CM

## 2024-07-09 DIAGNOSIS — R29898 Other symptoms and signs involving the musculoskeletal system: Secondary | ICD-10-CM

## 2024-07-09 NOTE — Telephone Encounter (Signed)
 This OT called pt and left a HIPAA appropriate voicemail regarding her no show on 07/07/24.   Valentin Nightingale, OTR/L [facility] [phone]

## 2024-07-09 NOTE — Therapy (Signed)
 OUTPATIENT OCCUPATIONAL THERAPY ORTHO TREATMENT NOTE REASSESSMENT/RECERTIFICATION  Patient Name: Jamie Harding MRN: [id number] DOB:Nov 03, 1956, 68 y.o., female Today's Date: 07/09/2024  Progress Note Reporting Period 05/29/24 to 07/09/24  See note below for Objective Data and Assessment of Progress/Goals.    END OF SESSION:   OT End of Session - 07/09/24 1351     Visit Number 9    Number of Visits 17    Date for OT Re-Evaluation 08/14/24    Authorization Type [employer]    OT Start Time 1308    OT Stop Time 1350    OT Time Calculation (min) 42 min    Activity Tolerance Patient tolerated treatment well    Behavior During Therapy [facility] for tasks assessed/performed           Past Medical History:  Diagnosis Date   Anxiety    Asthma    Depression    Essential hypertension    Fatty liver    Gastritis    Gastroparesis    GERD (gastroesophageal reflux disease)    Hiatal hernia    History of cardiac catheterization    Minimal coronary atherosclerosis February 2016   History of kidney stones    History of stroke 2008   HLD (hyperlipidemia)    Neuropathy    Obesity    Pancreatitis    Rheumatoid arthritis(714.0)    Stroke (HCC) 2008   Stroke (HCC) 01/2020   Tibialis tendinitis    Type 2 diabetes mellitus (HCC)    Past Surgical History:  Procedure Laterality Date   ANKLE SURGERY     remove extra bone-left   BIOPSY  04/25/2021   Procedure: BIOPSY;  Surgeon: Cindie Carlin POUR, DO;  [facility];  Service: Endoscopy;;   CARPAL TUNNEL RELEASE     right side   CARPAL TUNNEL RELEASE Left 02/06/2013   Procedure: CARPAL TUNNEL RELEASE ;  Surgeon: Taft FORBES Minerva, MD;  Location: [facility];  Service: Orthopedics;  Laterality: Left;  procedure end 1135   CARPAL TUNNEL RELEASE Right 09/14/2015   Procedure: RIGHT CARPAL TUNNEL RELEASE;  Surgeon: Taft FORBES Minerva, MD;  Location: [facility];  Service: Orthopedics;  Laterality: Right;   CARPAL TUNNEL RELEASE  Left 09/28/2015   Procedure: LEFT CARPAL TUNNEL RELEASE;  Surgeon: Taft FORBES Minerva, MD;  Location: [facility];  Service: Orthopedics;  Laterality: Left;  procedure 1   CESAREAN SECTION     CHOLECYSTECTOMY     COLONOSCOPY WITH PROPOFOL   09/16/2012   DOQ:Qpcz sessile polyps ranging between 3-6mm in size were found in the sigmoid colon and rectum (hyperplastic)/Mild diverticulosis was noted throughout the entire examined colon/Small internal hemorrhoids. Repeat in 10 years.   COLONOSCOPY WITH PROPOFOL  N/A 09/24/2022   Procedure: COLONOSCOPY WITH PROPOFOL ;  Surgeon: Cindie Carlin POUR, DO;  [facility];  Service: Endoscopy;  Laterality: N/A;  12:15pm, asa 3   DILATATION & CURETTAGE/HYSTEROSCOPY WITH MYOSURE N/A 08/05/2019   Procedure: DILATATION & CURETTAGE/HYSTEROSCOPY WITH MYOSURE;  Surgeon: Nicholaus Burnard HERO, MD;  Location: [facility];  Service: Gynecology;  Laterality: N/A;  myosure rep will be here.  Confirmed on 07/30/19 CS   DILATION AND CURETTAGE OF UTERUS     multiple   ESOPHAGOGASTRODUODENOSCOPY  07/29/2009   Mild gastritis, benign path   ESOPHAGOGASTRODUODENOSCOPY (EGD) WITH PROPOFOL   09/16/2012   SLF:Non-erosive gastritis (inflammation) was found; multiple bx/The duodenal mucosa showed no abnormalities in the ampulla and bulb and second portion of the duodenum/NAUSEA/VOMITING MOST LIKELY DUE  TO GERD/GASTRITIS   ESOPHAGOGASTRODUODENOSCOPY (EGD) WITH PROPOFOL  N/A 04/25/2021   Surgeon: Cindie Carlin POUR, DO; Gastritis biopsied, normal examined duodenum biopsied.  Pathology revealed focal chronic gastritis, negative for H. Pylori, benign small bowel mucosa.   FINGER SURGERY Left    left little finger-otif of finger   Gastric Emptying  07/27/2009   The amount of activity in the stomach at 120 minutes was 13% which is in the normal range   LASER ABLATION OF THE CERVIX     LEFT AND RIGHT HEART CATHETERIZATION WITH CORONARY ANGIOGRAM N/A 01/03/2015   Procedure: LEFT AND  RIGHT HEART CATHETERIZATION WITH CORONARY ANGIOGRAM;  Surgeon: Ozell JONETTA Fell, MD;  Location: [facility];  Service: Cardiovascular;  Laterality: N/A;   LITHOTRIPSY     POLYPECTOMY  09/16/2012   Procedure: POLYPECTOMY;  Surgeon: Margo LITTIE Haddock, MD;  Location: [facility];  Service: Endoscopy;  Laterality: N/A;   POLYPECTOMY  09/24/2022   Procedure: POLYPECTOMY;  Surgeon: Cindie Carlin POUR, DO;  [facility];  Service: Endoscopy;;   REVERSE SHOULDER ARTHROPLASTY Right 05/07/2024   Procedure: ARTHROPLASTY, SHOULDER, TOTAL, REVERSE;  Surgeon: Onesimo Oneil LABOR, MD;  Location: [facility];  Service: Orthopedics;  Laterality: Right;   SAVORY DILATION  09/16/2012   Procedure: SAVORY DILATION;  Surgeon: Margo LITTIE Haddock, MD;  Location: [facility];  Service: Endoscopy;  Laterality: N/A;  16 fr dilation   TRIGGER FINGER RELEASE Left 02/06/2013   Procedure: RELEASE TRIGGER FINGER LEFT LONG FINGER/A-1 PULLEY;  Surgeon: Taft FORBES Minerva, MD;  Location: [facility];  Service: Orthopedics;  Laterality: Left;  procedure began 1136   TRIGGER FINGER RELEASE Right 09/14/2015   Procedure: RIGHT LONG FINGER TRIGGER RELEASE;  Surgeon: Taft FORBES Minerva, MD;  Location: [facility];  Service: Orthopedics;  Laterality: Right;   TRIGGER FINGER RELEASE Left 09/28/2015   Procedure: LEFT RING TRIGGER FINGER RELEASE;  Surgeon: Taft FORBES Minerva, MD;  Location: [facility];  Service: Orthopedics;  Laterality: Left;  left ring finger   TRIGGER FINGER RELEASE Right 08/07/2023   Procedure: RELEASE TRIGGER FINGER/A-1 PULLEY Right thumb;  Surgeon: Minerva Taft FORBES, MD;  Location: [facility];  Service: Orthopedics;  Laterality: Right;   TUBAL LIGATION     Patient Active Problem List   Diagnosis Date Noted   Vasovagal syncope 06/08/2024   Anemia 06/08/2024   Urinary tract infection without hematuria 06/08/2024   Right rotator cuff tear 05/07/2024   Multiple wounds of skin 09/25/2023   Cellulitis of upper back excluding scapular region 09/25/2023    Trigger finger of right thumb 08/07/2023   Constipation 11/28/2022   Alternating constipation and diarrhea 09/03/2022   Dizziness 04/25/2022   History of colonic polyps 03/14/2022   Long-term current use of opiate analgesic 07/07/2021   Gastroesophageal reflux disease 03/27/2021   Left foot drop 03/17/2020   Former smoker    Chronic cystitis with hematuria 01/22/2020   Nephrolithiasis 01/22/2020   Hyperlipidemia associated with type 2 diabetes mellitus (HCC) 12/08/2019   Morbid obesity due to excess calories (HCC) 09/05/2015   Chronic pain syndrome 03/30/2015   History of CVA (cerebrovascular accident) without residual deficits 12/23/2014   Dyspnea on exertion 11/29/2014   Lumbar pain with radiation down left leg 02/16/2013   Gastroparesis 07/19/2009   Type 2 diabetes mellitus with vascular disease (HCC) 07/15/2009   ANXIETY 07/15/2009   Anxiety and depression 07/15/2009   Essential hypertension 07/15/2009   HIATAL HERNIA 07/15/2009   Fatty liver 07/15/2009    PCP: Alphonsa,  Glendia, MD REFERRING PROVIDER: Onesimo Anes, MD  ONSET DATE: 05/07/24  REFERRING DIAG: R Reverse Shoulder Arthroplasty  THERAPY DIAG:  Acute pain of right shoulder  Shoulder stiffness, right  Other symptoms and signs involving the musculoskeletal system  Rationale for Evaluation and Treatment: Rehabilitation  SUBJECTIVE:   SUBJECTIVE STATEMENT: I'm stiff, I can tell I haven't done as many exercises   PERTINENT HISTORY: Pt s/p R Reverse Shoulder Arthroplasty on 05/07/24  PRECAUTIONS: Shoulder - See protocol  WEIGHT BEARING RESTRICTIONS: Yes >1#  PAIN:  Are you having pain? No  FALLS: Has patient fallen in last 6 months? Yes. Number of falls 5  PLOF: Independent  PATIENT GOALS: To improve mobility  NEXT MD VISIT: 07/28/24  OBJECTIVE:   HAND DOMINANCE: Right  ADLs: Overall ADLs: Pt having most problems with lifting and carrying items for cooking and cleaning.   FUNCTIONAL OUTCOME  MEASURES: Upper Extremity Functional Scale (UEFS): 44/80 - 55% UEFS: 21/80 - 26.3%  UPPER EXTREMITY ROM:       Assessed in supine, er/IR adducted  Passive ROM Right eval  Shoulder flexion 151  Shoulder abduction 153  Shoulder internal rotation 90  Shoulder external rotation 41  (Blank rows = not tested)    Assessed in seated, er/IR adducted  Active ROM Right eval Right 07/09/24  Shoulder flexion  142  Shoulder abduction  160  Shoulder internal rotation  90  Shoulder external rotation  58  (Blank rows = not tested)  UPPER EXTREMITY MMT:     Assessed in seated, er/IR adducted  MMT Right eval Right 07/09/24  Shoulder flexion  4/5  Shoulder abduction  4+/5  Shoulder internal rotation  4+/5  Shoulder external rotation  4-/5  (Blank rows = not tested)    OBSERVATIONS: Moderate fascial restrictions along biceps and trapezius.    TODAY'S TREATMENT:                                                                                                                              DATE:   07/09/24 -Manual Therapy: myofascial release and trigger point applied to the biceps, triceps, subscapular region, and trapezius, in order to reduce pain and fascial restrictions, as well as improve ROM.  -A/ROM: supine- protraction, flexion, er/IR, abduction, horizontal abduction, x15 -X to V arms x 15 -Goal Post arms x15 -Reassessment  06/30/24 -Manual Therapy: myofascial release and trigger point applied to the biceps, triceps, subscapular region, and trapezius, in order to reduce pain and fascial restrictions, as well as improve ROM.  -A/ROM: supine- protraction, flexion, er/IR, abduction, horizontal abduction, x15 -X to V arms x 15 -Goal Post arms x15 -Scapular Strengthening: red band, rows, retraction, extension, x15 -UBE: level 1, 2.5' forwards and backwards  06/25/24 -Manual Therapy: myofascial release and trigger point applied to the biceps, triceps, subscapular region, and  trapezius, in order to reduce pain and fascial restrictions, as well as improve ROM.  -A/ROM: supine- protraction, flexion, er/IR, abduction,  horizontal abduction, x15 -X to V arms x 15 -Goal Post arms x15 -Scapular Strengthening: red band, rows, retraction, extension, x15   PATIENT EDUCATION: Education details: Review HEP Person educated: Patient Education method: Explanation, Demonstration, and Handouts Education comprehension: verbalized understanding and returned demonstration  HOME EXERCISE PROGRAM: 7/11: Table Slides and Pendulums 7/16: AA/ROM 7/31: A/ROM 8/8: Scapular Strengthening  GOALS: Goals reviewed with patient? Yes   SHORT TERM GOALS: Target date: 06/18/24  Pt will be provided with and educated on HEP to improve mobility in RUE required for use during ADL completion.   Goal status: IN PROGRESS  3.  Pt will increase RUE strength to 4-/5 to improve ability to reach for items at waist to chest height during bathing and grooming tasks.   Goal status: MET   LONG TERM GOALS: Target date: 07/10/24  Pt will decrease pain in RUE to 3/10 or less to improve ability to sleep for 2+ consecutive hours without waking due to pain.   Goal status: IN PROGRESS  2.  Pt will decrease RUE fascial restrictions to min amounts or less to improve mobility required for functional reaching tasks.   Goal status: IN PROGRESS  3.  Pt will increase RUE A/ROM by 10 degrees to improve ability to use RUE when reaching overhead or behind back during dressing and bathing tasks.   Goal status: MET  4.  Pt will increase RUE strength to 5/5 or greater to improve ability to use RUE when lifting or carrying items during meal preparation/housework/yardwork tasks.   Goal status: IN PROGRESS  5.  Pt will return to highest level of function using RUE as dominant during functional task completion.   Goal status: IN PROGRESS   ASSESSMENT:  CLINICAL IMPRESSION: This session pt completing her  reassessment for recertification. She is demonstrating improving active ROM and strength. At this time she continues to have the most weakness along her internal and external rotation. Pt also having mild stiffness noted due to decreased completion of HEP due to personal problems. OT providing education on HEP and using her RUE, as well as verbal and tactile cuing for positioning and technique.   PERFORMANCE DEFICITS: in functional skills including in functional skills including ADLs, IADLs, coordination, tone, ROM, strength, pain, fascial restrictions, muscle spasms, and UE functional use.   PLAN:  OT FREQUENCY: 2x/week  OT DURATION: 6 weeks  PLANNED INTERVENTIONS: 97168 OT Re-evaluation, 97535 self care/ADL training, 02889 therapeutic exercise, 97530 therapeutic activity, 97112 neuromuscular re-education, 97140 manual therapy, 97010 moist heat, 97032 electrical stimulation (manual), passive range of motion, functional mobility training, energy conservation, coping strategies training, patient/family education, and DME and/or AE instructions  CONSULTED AND AGREED WITH PLAN OF CARE: Patient  PLAN FOR NEXT SESSION: follow up on HEP, manual techniques, AA/ROM, functional reaching   Valentin Nightingale, OTR/L [phone] 07/09/2024, 1:53 PM

## 2024-07-13 ENCOUNTER — Encounter (HOSPITAL_COMMUNITY): Payer: Self-pay | Admitting: Occupational Therapy

## 2024-07-13 ENCOUNTER — Ambulatory Visit (HOSPITAL_COMMUNITY): Admitting: Occupational Therapy

## 2024-07-13 DIAGNOSIS — R29898 Other symptoms and signs involving the musculoskeletal system: Secondary | ICD-10-CM

## 2024-07-13 DIAGNOSIS — M25511 Pain in right shoulder: Secondary | ICD-10-CM | POA: Diagnosis not present

## 2024-07-13 DIAGNOSIS — M25611 Stiffness of right shoulder, not elsewhere classified: Secondary | ICD-10-CM

## 2024-07-13 NOTE — Patient Instructions (Signed)

## 2024-07-13 NOTE — Therapy (Unsigned)
 OUTPATIENT OCCUPATIONAL THERAPY ORTHO TREATMENT NOTE REASSESSMENT/RECERTIFICATION  Patient Name: Jamie Harding MRN: [id number] DOB:04/16/1956, 68 y.o., female Today's Date: 07/13/2024  Progress Note Reporting Period 05/29/24 to 07/09/24  See note below for Objective Data and Assessment of Progress/Goals.    END OF SESSION:      Past Medical History:  Diagnosis Date   Anxiety    Asthma    Depression    Essential hypertension    Fatty liver    Gastritis    Gastroparesis    GERD (gastroesophageal reflux disease)    Hiatal hernia    History of cardiac catheterization    Minimal coronary atherosclerosis February 2016   History of kidney stones    History of stroke 2008   HLD (hyperlipidemia)    Neuropathy    Obesity    Pancreatitis    Rheumatoid arthritis(714.0)    Stroke (HCC) 2008   Stroke (HCC) 01/2020   Tibialis tendinitis    Type 2 diabetes mellitus (HCC)    Past Surgical History:  Procedure Laterality Date   ANKLE SURGERY     remove extra bone-left   BIOPSY  04/25/2021   Procedure: BIOPSY;  Surgeon: Cindie Carlin POUR, DO;  Location: [facility];  Service: Endoscopy;;   CARPAL TUNNEL RELEASE     right side   CARPAL TUNNEL RELEASE Left 02/06/2013   Procedure: CARPAL TUNNEL RELEASE ;  Surgeon: Taft FORBES Minerva, MD;  Location: [facility];  Service: Orthopedics;  Laterality: Left;  procedure end 1135   CARPAL TUNNEL RELEASE Right 09/14/2015   Procedure: RIGHT CARPAL TUNNEL RELEASE;  Surgeon: Taft FORBES Minerva, MD;  Location: [facility];  Service: Orthopedics;  Laterality: Right;   CARPAL TUNNEL RELEASE Left 09/28/2015   Procedure: LEFT CARPAL TUNNEL RELEASE;  Surgeon: Taft FORBES Minerva, MD;  Location: [facility];  Service: Orthopedics;  Laterality: Left;  procedure 1   CESAREAN SECTION     CHOLECYSTECTOMY     COLONOSCOPY WITH PROPOFOL   09/16/2012   DOQ:Qpcz sessile polyps ranging between 3-20mm in size were found in the sigmoid colon and rectum (hyperplastic)/Mild  diverticulosis was noted throughout the entire examined colon/Small internal hemorrhoids. Repeat in 10 years.   COLONOSCOPY WITH PROPOFOL  N/A 09/24/2022   Procedure: COLONOSCOPY WITH PROPOFOL ;  Surgeon: Cindie Carlin POUR, DO;  Location: [facility];  Service: Endoscopy;  Laterality: N/A;  12:15pm, asa 3   DILATATION & CURETTAGE/HYSTEROSCOPY WITH MYOSURE N/A 08/05/2019   Procedure: DILATATION & CURETTAGE/HYSTEROSCOPY WITH MYOSURE;  Surgeon: Nicholaus Burnard HERO, MD;  Location: [facility];  Service: Gynecology;  Laterality: N/A;  myosure rep will be here.  Confirmed on 07/30/19 CS   DILATION AND CURETTAGE OF UTERUS     multiple   ESOPHAGOGASTRODUODENOSCOPY  07/29/2009   Mild gastritis, benign path   ESOPHAGOGASTRODUODENOSCOPY (EGD) WITH PROPOFOL   09/16/2012   SLF:Non-erosive gastritis (inflammation) was found; multiple bx/The duodenal mucosa showed no abnormalities in the ampulla and bulb and second portion of the duodenum/NAUSEA/VOMITING MOST LIKELY DUE TO GERD/GASTRITIS   ESOPHAGOGASTRODUODENOSCOPY (EGD) WITH PROPOFOL  N/A 04/25/2021   Surgeon: Cindie Carlin POUR, DO; Gastritis biopsied, normal examined duodenum biopsied.  Pathology revealed focal chronic gastritis, negative for H. Pylori, benign small bowel mucosa.   FINGER SURGERY Left    left little finger-otif of finger   Gastric Emptying  07/27/2009   The amount of activity in the stomach at 120 minutes was 13% which is in the normal range   LASER ABLATION OF THE CERVIX  LEFT AND RIGHT HEART CATHETERIZATION WITH CORONARY ANGIOGRAM N/A 01/03/2015   Procedure: LEFT AND RIGHT HEART CATHETERIZATION WITH CORONARY ANGIOGRAM;  Surgeon: Ozell JONETTA Fell, MD;  Location: [facility];  Service: Cardiovascular;  Laterality: N/A;   LITHOTRIPSY     POLYPECTOMY  09/16/2012   Procedure: POLYPECTOMY;  Surgeon: Margo LITTIE Haddock, MD;  Location: [facility];  Service: Endoscopy;  Laterality: N/A;   POLYPECTOMY  09/24/2022   Procedure: POLYPECTOMY;   Surgeon: Cindie Carlin POUR, DO;  Location: [facility];  Service: Endoscopy;;   REVERSE SHOULDER ARTHROPLASTY Right 05/07/2024   Procedure: ARTHROPLASTY, SHOULDER, TOTAL, REVERSE;  Surgeon: Onesimo Oneil LABOR, MD;  Location: [facility];  Service: Orthopedics;  Laterality: Right;   SAVORY DILATION  09/16/2012   Procedure: SAVORY DILATION;  Surgeon: Margo LITTIE Haddock, MD;  Location: [facility];  Service: Endoscopy;  Laterality: N/A;  16 fr dilation   TRIGGER FINGER RELEASE Left 02/06/2013   Procedure: RELEASE TRIGGER FINGER LEFT LONG FINGER/A-1 PULLEY;  Surgeon: Taft FORBES Minerva, MD;  Location: [facility];  Service: Orthopedics;  Laterality: Left;  procedure began 1136   TRIGGER FINGER RELEASE Right 09/14/2015   Procedure: RIGHT LONG FINGER TRIGGER RELEASE;  Surgeon: Taft FORBES Minerva, MD;  Location: [facility];  Service: Orthopedics;  Laterality: Right;   TRIGGER FINGER RELEASE Left 09/28/2015   Procedure: LEFT RING TRIGGER FINGER RELEASE;  Surgeon: Taft FORBES Minerva, MD;  Location: [facility];  Service: Orthopedics;  Laterality: Left;  left ring finger   TRIGGER FINGER RELEASE Right 08/07/2023   Procedure: RELEASE TRIGGER FINGER/A-1 PULLEY Right thumb;  Surgeon: Minerva Taft FORBES, MD;  Location: [facility];  Service: Orthopedics;  Laterality: Right;   TUBAL LIGATION     Patient Active Problem List   Diagnosis Date Noted   Vasovagal syncope 06/08/2024   Anemia 06/08/2024   Urinary tract infection without hematuria 06/08/2024   Right rotator cuff tear 05/07/2024   Multiple wounds of skin 09/25/2023   Cellulitis of upper back excluding scapular region 09/25/2023   Trigger finger of right thumb 08/07/2023   Constipation 11/28/2022   Alternating constipation and diarrhea 09/03/2022   Dizziness 04/25/2022   History of colonic polyps 03/14/2022   Long-term current use of opiate analgesic 07/07/2021   Gastroesophageal reflux disease 03/27/2021   Left foot drop 03/17/2020   Former smoker    Chronic cystitis with  hematuria 01/22/2020   Nephrolithiasis 01/22/2020   Hyperlipidemia associated with type 2 diabetes mellitus (HCC) 12/08/2019   Morbid obesity due to excess calories (HCC) 09/05/2015   Chronic pain syndrome 03/30/2015   History of CVA (cerebrovascular accident) without residual deficits 12/23/2014   Dyspnea on exertion 11/29/2014   Lumbar pain with radiation down left leg 02/16/2013   Gastroparesis 07/19/2009   Type 2 diabetes mellitus with vascular disease (HCC) 07/15/2009   ANXIETY 07/15/2009   Anxiety and depression 07/15/2009   Essential hypertension 07/15/2009   HIATAL HERNIA 07/15/2009   Fatty liver 07/15/2009    PCP: Alphonsa Hamilton, MD REFERRING PROVIDER: Onesimo Oneil, MD  ONSET DATE: 05/07/24  REFERRING DIAG: R Reverse Shoulder Arthroplasty  THERAPY DIAG:  No diagnosis found.  Rationale for Evaluation and Treatment: Rehabilitation  SUBJECTIVE:   SUBJECTIVE STATEMENT: I'm stiff, I can tell I haven't done as many exercises   PERTINENT HISTORY: Pt s/p R Reverse Shoulder Arthroplasty on 05/07/24  PRECAUTIONS: Shoulder - See protocol  WEIGHT BEARING RESTRICTIONS: Yes >1#  PAIN:  Are you having pain? No  FALLS: Has patient fallen in  last 6 months? Yes. Number of falls 5  PLOF: Independent  PATIENT GOALS: To improve mobility  NEXT MD VISIT: 07/28/24  OBJECTIVE:   HAND DOMINANCE: Right  ADLs: Overall ADLs: Pt having most problems with lifting and carrying items for cooking and cleaning.   FUNCTIONAL OUTCOME MEASURES: Upper Extremity Functional Scale (UEFS): 44/80 - 55% UEFS: 21/80 - 26.3%  UPPER EXTREMITY ROM:       Assessed in supine, er/IR adducted  Passive ROM Right eval  Shoulder flexion 151  Shoulder abduction 153  Shoulder internal rotation 90  Shoulder external rotation 41  (Blank rows = not tested)    Assessed in seated, er/IR adducted  Active ROM Right eval Right 07/09/24  Shoulder flexion  142  Shoulder abduction  160  Shoulder  internal rotation  90  Shoulder external rotation  58  (Blank rows = not tested)  UPPER EXTREMITY MMT:     Assessed in seated, er/IR adducted  MMT Right eval Right 07/09/24  Shoulder flexion  4/5  Shoulder abduction  4+/5  Shoulder internal rotation  4+/5  Shoulder external rotation  4-/5  (Blank rows = not tested)    OBSERVATIONS: Moderate fascial restrictions along biceps and trapezius.    TODAY'S TREATMENT:                                                                                                                              DATE:   07/13/24 -Manual Therapy: myofascial release and trigger point applied to the biceps, triceps, subscapular region, and trapezius, in order to reduce pain and fascial restrictions, as well as improve ROM.  -A/ROM: supine- protraction, flexion, er/IR, abduction, horizontal abduction, x15 -X to V arms x 15 -Goal Post arms x15 -Proximal Shoulder Exercises: paddles, criss cross, circles both directions, x10 -PNF Strengthening:  red band, chest pulls, overhead pulls, er pulls, PNF up, PNF down, x10  07/09/24 -Manual Therapy: myofascial release and trigger point applied to the biceps, triceps, subscapular region, and trapezius, in order to reduce pain and fascial restrictions, as well as improve ROM.  -A/ROM: supine- protraction, flexion, er/IR, abduction, horizontal abduction, x15 -X to V arms x 15 -Goal Post arms x15 -Reassessment  06/30/24 -Manual Therapy: myofascial release and trigger point applied to the biceps, triceps, subscapular region, and trapezius, in order to reduce pain and fascial restrictions, as well as improve ROM.  -A/ROM: supine- protraction, flexion, er/IR, abduction, horizontal abduction, x15 -X to V arms x 15 -Goal Post arms x15 -Scapular Strengthening: red band, rows, retraction, extension, x15 -UBE: level 1, 2.5' forwards and backwards  06/25/24 -Manual Therapy: myofascial release and trigger point applied to the  biceps, triceps, subscapular region, and trapezius, in order to reduce pain and fascial restrictions, as well as improve ROM.  -A/ROM: supine- protraction, flexion, er/IR, abduction, horizontal abduction, x15 -X to V arms x 15 -Goal Post arms x15 -Scapular Strengthening: red band, rows, retraction, extension, x15   PATIENT EDUCATION:  Education details: Review HEP Person educated: Patient Education method: Explanation, Demonstration, and Handouts Education comprehension: verbalized understanding and returned demonstration  HOME EXERCISE PROGRAM: 7/11: Table Slides and Pendulums 7/16: AA/ROM 7/31: A/ROM 8/8: Scapular Strengthening  GOALS: Goals reviewed with patient? Yes   SHORT TERM GOALS: Target date: 06/18/24  Pt will be provided with and educated on HEP to improve mobility in RUE required for use during ADL completion.   Goal status: IN PROGRESS  3.  Pt will increase RUE strength to 4-/5 to improve ability to reach for items at waist to chest height during bathing and grooming tasks.   Goal status: MET   LONG TERM GOALS: Target date: 07/10/24  Pt will decrease pain in RUE to 3/10 or less to improve ability to sleep for 2+ consecutive hours without waking due to pain.   Goal status: IN PROGRESS  2.  Pt will decrease RUE fascial restrictions to min amounts or less to improve mobility required for functional reaching tasks.   Goal status: IN PROGRESS  3.  Pt will increase RUE A/ROM by 10 degrees to improve ability to use RUE when reaching overhead or behind back during dressing and bathing tasks.   Goal status: MET  4.  Pt will increase RUE strength to 5/5 or greater to improve ability to use RUE when lifting or carrying items during meal preparation/housework/yardwork tasks.   Goal status: IN PROGRESS  5.  Pt will return to highest level of function using RUE as dominant during functional task completion.   Goal status: IN PROGRESS   ASSESSMENT:  CLINICAL  IMPRESSION: This session pt completing her reassessment for recertification. She is demonstrating improving active ROM and strength. At this time she continues to have the most weakness along her internal and external rotation. Pt also having mild stiffness noted due to decreased completion of HEP due to personal problems. OT providing education on HEP and using her RUE, as well as verbal and tactile cuing for positioning and technique.   PERFORMANCE DEFICITS: in functional skills including in functional skills including ADLs, IADLs, coordination, tone, ROM, strength, pain, fascial restrictions, muscle spasms, and UE functional use.   PLAN:  OT FREQUENCY: 2x/week  OT DURATION: 6 weeks  PLANNED INTERVENTIONS: 97168 OT Re-evaluation, 97535 self care/ADL training, 02889 therapeutic exercise, 97530 therapeutic activity, 97112 neuromuscular re-education, 97140 manual therapy, 97010 moist heat, 97032 electrical stimulation (manual), passive range of motion, functional mobility training, energy conservation, coping strategies training, patient/family education, and DME and/or AE instructions  CONSULTED AND AGREED WITH PLAN OF CARE: Patient  PLAN FOR NEXT SESSION: follow up on HEP, manual techniques, AA/ROM, functional reaching   Valentin Nightingale, OTR/L [phone] 07/13/2024, 3:35 PM

## 2024-07-16 ENCOUNTER — Encounter (HOSPITAL_COMMUNITY): Payer: Self-pay | Admitting: Occupational Therapy

## 2024-07-16 ENCOUNTER — Ambulatory Visit (HOSPITAL_COMMUNITY): Admitting: Occupational Therapy

## 2024-07-16 DIAGNOSIS — M25511 Pain in right shoulder: Secondary | ICD-10-CM

## 2024-07-16 DIAGNOSIS — M25611 Stiffness of right shoulder, not elsewhere classified: Secondary | ICD-10-CM

## 2024-07-16 DIAGNOSIS — R29898 Other symptoms and signs involving the musculoskeletal system: Secondary | ICD-10-CM

## 2024-07-16 NOTE — Patient Instructions (Signed)

## 2024-07-16 NOTE — Therapy (Unsigned)
 OUTPATIENT OCCUPATIONAL THERAPY ORTHO TREATMENT NOTE   Patient Name: Jamie Harding MRN: [id number] DOB:04-24-1956, 68 y.o., female Today's Date: 07/17/2024  END OF SESSION:   OT End of Session - 07/16/24 1700     Visit Number 11    Number of Visits 17    Date for OT Re-Evaluation 08/14/24    Authorization Type [employer]    OT Start Time 1619    OT Stop Time 1700    OT Time Calculation (min) 41 min    Activity Tolerance Patient tolerated treatment well    Behavior During Therapy [facility] for tasks assessed/performed           Past Medical History:  Diagnosis Date   Anxiety    Asthma    Depression    Essential hypertension    Fatty liver    Gastritis    Gastroparesis    GERD (gastroesophageal reflux disease)    Hiatal hernia    History of cardiac catheterization    Minimal coronary atherosclerosis February 2016   History of kidney stones    History of stroke 2008   HLD (hyperlipidemia)    Neuropathy    Obesity    Pancreatitis    Rheumatoid arthritis(714.0)    Stroke (HCC) 2008   Stroke (HCC) 01/2020   Tibialis tendinitis    Type 2 diabetes mellitus (HCC)    Past Surgical History:  Procedure Laterality Date   ANKLE SURGERY     remove extra bone-left   BIOPSY  04/25/2021   Procedure: BIOPSY;  Surgeon: Cindie Carlin POUR, DO;  [facility]: [facility];  Service: Endoscopy;;   CARPAL TUNNEL RELEASE     right side   CARPAL TUNNEL RELEASE Left 02/06/2013   Procedure: CARPAL TUNNEL RELEASE ;  Surgeon: Taft FORBES Minerva, MD;  Location: [facility];  Service: Orthopedics;  Laterality: Left;  procedure end 1135   CARPAL TUNNEL RELEASE Right 09/14/2015   Procedure: RIGHT CARPAL TUNNEL RELEASE;  Surgeon: Taft FORBES Minerva, MD;  Location: [facility];  Service: Orthopedics;  Laterality: Right;   CARPAL TUNNEL RELEASE Left 09/28/2015   Procedure: LEFT CARPAL TUNNEL RELEASE;  Surgeon: Taft FORBES Minerva, MD;  Location: [facility];  Service: Orthopedics;  Laterality: Left;   procedure 1   CESAREAN SECTION     CHOLECYSTECTOMY     COLONOSCOPY WITH PROPOFOL   09/16/2012   DOQ:Qpcz sessile polyps ranging between 3-60mm in size were found in the sigmoid colon and rectum (hyperplastic)/Mild diverticulosis was noted throughout the entire examined colon/Small internal hemorrhoids. Repeat in 10 years.   COLONOSCOPY WITH PROPOFOL  N/A 09/24/2022   Procedure: COLONOSCOPY WITH PROPOFOL ;  Surgeon: Cindie Carlin POUR, DO;  [facility];  Service: Endoscopy;  Laterality: N/A;  12:15pm, asa 3   DILATATION & CURETTAGE/HYSTEROSCOPY WITH MYOSURE N/A 08/05/2019   Procedure: DILATATION & CURETTAGE/HYSTEROSCOPY WITH MYOSURE;  Surgeon: Nicholaus Burnard HERO, MD;  Location: [facility];  Service: Gynecology;  Laterality: N/A;  myosure rep will be here.  Confirmed on 07/30/19 CS   DILATION AND CURETTAGE OF UTERUS     multiple   ESOPHAGOGASTRODUODENOSCOPY  07/29/2009   Mild gastritis, benign path   ESOPHAGOGASTRODUODENOSCOPY (EGD) WITH PROPOFOL   09/16/2012   SLF:Non-erosive gastritis (inflammation) was found; multiple bx/The duodenal mucosa showed no abnormalities in the ampulla and bulb and second portion of the duodenum/NAUSEA/VOMITING MOST LIKELY DUE TO GERD/GASTRITIS   ESOPHAGOGASTRODUODENOSCOPY (EGD) WITH PROPOFOL  N/A 04/25/2021   Surgeon: Cindie Carlin POUR, DO; Gastritis biopsied, normal examined  duodenum biopsied.  Pathology revealed focal chronic gastritis, negative for H. Pylori, benign small bowel mucosa.   FINGER SURGERY Left    left little finger-otif of finger   Gastric Emptying  07/27/2009   The amount of activity in the stomach at 120 minutes was 13% which is in the normal range   LASER ABLATION OF THE CERVIX     LEFT AND RIGHT HEART CATHETERIZATION WITH CORONARY ANGIOGRAM N/A 01/03/2015   Procedure: LEFT AND RIGHT HEART CATHETERIZATION WITH CORONARY ANGIOGRAM;  Surgeon: Ozell JONETTA Fell, MD;  Location: [facility];  Service: Cardiovascular;  Laterality: N/A;    LITHOTRIPSY     POLYPECTOMY  09/16/2012   Procedure: POLYPECTOMY;  Surgeon: Margo LITTIE Haddock, MD;  Location: [facility];  Service: Endoscopy;  Laterality: N/A;   POLYPECTOMY  09/24/2022   Procedure: POLYPECTOMY;  Surgeon: Cindie Carlin POUR, DO;  [facility];  Service: Endoscopy;;   REVERSE SHOULDER ARTHROPLASTY Right 05/07/2024   Procedure: ARTHROPLASTY, SHOULDER, TOTAL, REVERSE;  Surgeon: Onesimo Oneil LABOR, MD;  Location: [facility];  Service: Orthopedics;  Laterality: Right;   SAVORY DILATION  09/16/2012   Procedure: SAVORY DILATION;  Surgeon: Margo LITTIE Haddock, MD;  Location: [facility];  Service: Endoscopy;  Laterality: N/A;  16 fr dilation   TRIGGER FINGER RELEASE Left 02/06/2013   Procedure: RELEASE TRIGGER FINGER LEFT LONG FINGER/A-1 PULLEY;  Surgeon: Taft FORBES Minerva, MD;  Location: [facility];  Service: Orthopedics;  Laterality: Left;  procedure began 1136   TRIGGER FINGER RELEASE Right 09/14/2015   Procedure: RIGHT LONG FINGER TRIGGER RELEASE;  Surgeon: Taft FORBES Minerva, MD;  Location: [facility];  Service: Orthopedics;  Laterality: Right;   TRIGGER FINGER RELEASE Left 09/28/2015   Procedure: LEFT RING TRIGGER FINGER RELEASE;  Surgeon: Taft FORBES Minerva, MD;  Location: [facility];  Service: Orthopedics;  Laterality: Left;  left ring finger   TRIGGER FINGER RELEASE Right 08/07/2023   Procedure: RELEASE TRIGGER FINGER/A-1 PULLEY Right thumb;  Surgeon: Minerva Taft FORBES, MD;  Location: [facility];  Service: Orthopedics;  Laterality: Right;   TUBAL LIGATION     Patient Active Problem List   Diagnosis Date Noted   Vasovagal syncope 06/08/2024   Anemia 06/08/2024   Urinary tract infection without hematuria 06/08/2024   Right rotator cuff tear 05/07/2024   Multiple wounds of skin 09/25/2023   Cellulitis of upper back excluding scapular region 09/25/2023   Trigger finger of right thumb 08/07/2023   Constipation 11/28/2022   Alternating constipation and diarrhea 09/03/2022   Dizziness 04/25/2022    History of colonic polyps 03/14/2022   Long-term current use of opiate analgesic 07/07/2021   Gastroesophageal reflux disease 03/27/2021   Left foot drop 03/17/2020   Former smoker    Chronic cystitis with hematuria 01/22/2020   Nephrolithiasis 01/22/2020   Hyperlipidemia associated with type 2 diabetes mellitus (HCC) 12/08/2019   Morbid obesity due to excess calories (HCC) 09/05/2015   Chronic pain syndrome 03/30/2015   History of CVA (cerebrovascular accident) without residual deficits 12/23/2014   Dyspnea on exertion 11/29/2014   Lumbar pain with radiation down left leg 02/16/2013   Gastroparesis 07/19/2009   Type 2 diabetes mellitus with vascular disease (HCC) 07/15/2009   ANXIETY 07/15/2009   Anxiety and depression 07/15/2009   Essential hypertension 07/15/2009   HIATAL HERNIA 07/15/2009   Fatty liver 07/15/2009    PCP: Alphonsa Hamilton, MD REFERRING PROVIDER: Onesimo Oneil, MD  ONSET DATE: 05/07/24  REFERRING DIAG: R Reverse Shoulder Arthroplasty  THERAPY DIAG:  Acute pain of right shoulder  Shoulder stiffness, right  Other symptoms and signs involving the musculoskeletal system  Rationale for Evaluation and Treatment: Rehabilitation  SUBJECTIVE:   SUBJECTIVE STATEMENT: I think I'm doing well with it.   PERTINENT HISTORY: Pt s/p R Reverse Shoulder Arthroplasty on 05/07/24  PRECAUTIONS: Shoulder - See protocol  WEIGHT BEARING RESTRICTIONS: Yes >1#  PAIN:  Are you having pain? No  FALLS: Has patient fallen in last 6 months? Yes. Number of falls 5  PLOF: Independent  PATIENT GOALS: To improve mobility  NEXT MD VISIT: 07/28/24  OBJECTIVE:   HAND DOMINANCE: Right  ADLs: Overall ADLs: Pt having most problems with lifting and carrying items for cooking and cleaning.   FUNCTIONAL OUTCOME MEASURES: Upper Extremity Functional Scale (UEFS): 44/80 - 55% UEFS: 21/80 - 26.3%  UPPER EXTREMITY ROM:       Assessed in supine, er/IR adducted  Passive ROM  Right eval  Shoulder flexion 151  Shoulder abduction 153  Shoulder internal rotation 90  Shoulder external rotation 41  (Blank rows = not tested)    Assessed in seated, er/IR adducted  Active ROM Right eval Right 07/09/24  Shoulder flexion  142  Shoulder abduction  160  Shoulder internal rotation  90  Shoulder external rotation  58  (Blank rows = not tested)  UPPER EXTREMITY MMT:     Assessed in seated, er/IR adducted  MMT Right eval Right 07/09/24  Shoulder flexion  4/5  Shoulder abduction  4+/5  Shoulder internal rotation  4+/5  Shoulder external rotation  4-/5  (Blank rows = not tested)    OBSERVATIONS: Moderate fascial restrictions along biceps and trapezius.    TODAY'S TREATMENT:                                                                                                                              DATE:   07/16/24 -Manual Therapy: myofascial release and trigger point applied to the biceps, triceps, subscapular region, and trapezius, in order to reduce pain and fascial restrictions, as well as improve ROM.  -A/ROM: supine- protraction, flexion, er/IR, abduction, horizontal abduction, x15 -X to V arms x 15 -Goal Post arms x15 -Stretching: flexion, abduction, x10 -Theraball Exercises: yellow ball, flexion, protraction, overhead press, V ups, circles both directions, x12  07/13/24 -Manual Therapy: myofascial release and trigger point applied to the biceps, triceps, subscapular region, and trapezius, in order to reduce pain and fascial restrictions, as well as improve ROM.  -A/ROM: supine- protraction, flexion, er/IR, abduction, horizontal abduction, x15 -X to V arms x 15 -Goal Post arms x15 -Proximal Shoulder Exercises: paddles, criss cross, circles both directions, x10 -PNF Strengthening:  red band, chest pulls, overhead pulls, er pulls, PNF up, PNF down, x10  07/09/24 -Manual Therapy: myofascial release and trigger point applied to the biceps, triceps,  subscapular region, and trapezius, in order to reduce pain and fascial restrictions, as well as improve ROM.  -A/ROM: supine- protraction, flexion,  er/IR, abduction, horizontal abduction, x15 -X to V arms x 15 -Goal Post arms x15 -Reassessment   PATIENT EDUCATION: Education details: Theraball Exercises Person educated: Patient Education method: Explanation, Demonstration, and Handouts Education comprehension: verbalized understanding and returned demonstration  HOME EXERCISE PROGRAM: 7/11: Table Slides and Pendulums 7/16: AA/ROM 7/31: A/ROM 8/8: Scapular Strengthening 8/26: PNF Strengthening  GOALS: Goals reviewed with patient? Yes   SHORT TERM GOALS: Target date: 06/18/24  Pt will be provided with and educated on HEP to improve mobility in RUE required for use during ADL completion.   Goal status: IN PROGRESS  3.  Pt will increase RUE strength to 4-/5 to improve ability to reach for items at waist to chest height during bathing and grooming tasks.   Goal status: MET   LONG TERM GOALS: Target date: 07/10/24  Pt will decrease pain in RUE to 3/10 or less to improve ability to sleep for 2+ consecutive hours without waking due to pain.   Goal status: IN PROGRESS  2.  Pt will decrease RUE fascial restrictions to min amounts or less to improve mobility required for functional reaching tasks.   Goal status: IN PROGRESS  3.  Pt will increase RUE A/ROM by 10 degrees to improve ability to use RUE when reaching overhead or behind back during dressing and bathing tasks.   Goal status: MET  4.  Pt will increase RUE strength to 5/5 or greater to improve ability to use RUE when lifting or carrying items during meal preparation/housework/yardwork tasks.   Goal status: IN PROGRESS  5.  Pt will return to highest level of function using RUE as dominant during functional task completion.   Goal status: IN PROGRESS   ASSESSMENT:  CLINICAL IMPRESSION: This session pt continuing  to work on further improving the end ranges of her ROM to improve her ability to reach overhead and behind her back. External rotation continues to be mildly limited, however improving. OT added theraball exercises this session for functional strengthening, mimicking lifting objects overhead and out with moderate weight (~3lbs). Verbal and tactile cuing provided for positioning and technique throughout each session.   PERFORMANCE DEFICITS: in functional skills including in functional skills including ADLs, IADLs, coordination, tone, ROM, strength, pain, fascial restrictions, muscle spasms, and UE functional use.   PLAN:  OT FREQUENCY: 2x/week  OT DURATION: 6 weeks  PLANNED INTERVENTIONS: 97168 OT Re-evaluation, 97535 self care/ADL training, 02889 therapeutic exercise, 97530 therapeutic activity, 97112 neuromuscular re-education, 97140 manual therapy, 97010 moist heat, 97032 electrical stimulation (manual), passive range of motion, functional mobility training, energy conservation, coping strategies training, patient/family education, and DME and/or AE instructions  CONSULTED AND AGREED WITH PLAN OF CARE: Patient  PLAN FOR NEXT SESSION: follow up on HEP, manual techniques, AA/ROM, functional reaching   Valentin Nightingale, OTR/L [phone] 07/17/2024, 8:45 AM

## 2024-07-21 ENCOUNTER — Encounter (HOSPITAL_COMMUNITY): Payer: Self-pay

## 2024-07-21 ENCOUNTER — Ambulatory Visit (HOSPITAL_COMMUNITY): Attending: Orthopedic Surgery | Admitting: Occupational Therapy

## 2024-07-21 ENCOUNTER — Telehealth (HOSPITAL_COMMUNITY): Payer: Self-pay | Admitting: Occupational Therapy

## 2024-07-21 DIAGNOSIS — M25511 Pain in right shoulder: Secondary | ICD-10-CM | POA: Insufficient documentation

## 2024-07-21 DIAGNOSIS — R29898 Other symptoms and signs involving the musculoskeletal system: Secondary | ICD-10-CM | POA: Insufficient documentation

## 2024-07-21 DIAGNOSIS — M25611 Stiffness of right shoulder, not elsewhere classified: Secondary | ICD-10-CM | POA: Insufficient documentation

## 2024-07-21 NOTE — Telephone Encounter (Signed)
 Attempted to call pt regarding no-show for 07/21/24. No answer, left vm requesting return call if pt unable to attend next appt on 07/23/24 at 145.    Sonny Cory, OTR/L  [phone] 07/21/24

## 2024-07-23 ENCOUNTER — Encounter (HOSPITAL_COMMUNITY): Admitting: Occupational Therapy

## 2024-07-27 ENCOUNTER — Encounter (HOSPITAL_COMMUNITY): Payer: Self-pay | Admitting: Occupational Therapy

## 2024-07-27 ENCOUNTER — Ambulatory Visit (HOSPITAL_COMMUNITY): Admitting: Occupational Therapy

## 2024-07-27 DIAGNOSIS — M25511 Pain in right shoulder: Secondary | ICD-10-CM

## 2024-07-27 DIAGNOSIS — M25611 Stiffness of right shoulder, not elsewhere classified: Secondary | ICD-10-CM

## 2024-07-27 DIAGNOSIS — R29898 Other symptoms and signs involving the musculoskeletal system: Secondary | ICD-10-CM

## 2024-07-27 NOTE — Therapy (Signed)
 OUTPATIENT OCCUPATIONAL THERAPY ORTHO TREATMENT NOTE   Patient Name: Jamie Harding MRN: [id number] DOB:[birth year], 68 y.o., female Today's Date: 07/27/2024  END OF SESSION:   OT End of Session - 07/27/24 1439     Visit Number 12    Number of Visits 17    Date for OT Re-Evaluation 08/14/24    Authorization Type [employer]    OT Start Time 1343    OT Stop Time 1428    OT Time Calculation (min) 45 min    Activity Tolerance Patient tolerated treatment well    Behavior During Therapy [facility] for tasks assessed/performed          Past Medical History:  Diagnosis Date   Anxiety    Asthma    Depression    Essential hypertension    Fatty liver    Gastritis    Gastroparesis    GERD (gastroesophageal reflux disease)    Hiatal hernia    History of cardiac catheterization    Minimal coronary atherosclerosis February 2016   History of kidney stones    History of stroke 2008   HLD (hyperlipidemia)    Neuropathy    Obesity    Pancreatitis    Rheumatoid arthritis(714.0)    Stroke (HCC) 2008   Stroke (HCC) 01/2020   Tibialis tendinitis    Type 2 diabetes mellitus (HCC)    Past Surgical History:  Procedure Laterality Date   ANKLE SURGERY     remove extra bone-left   BIOPSY  04/25/2021   Procedure: BIOPSY;  Surgeon: Cindie Carlin POUR, DO;  [facility];  Service: Endoscopy;;   CARPAL TUNNEL RELEASE     right side   CARPAL TUNNEL RELEASE Left 02/06/2013   Procedure: CARPAL TUNNEL RELEASE ;  Surgeon: Taft FORBES Minerva, MD;  Location: [facility];  Service: Orthopedics;  Laterality: Left;  procedure end 1135   CARPAL TUNNEL RELEASE Right 09/14/2015   Procedure: RIGHT CARPAL TUNNEL RELEASE;  Surgeon: Taft FORBES Minerva, MD;  Location: [facility];  Service: Orthopedics;  Laterality: Right;   CARPAL TUNNEL RELEASE Left 09/28/2015   Procedure: LEFT CARPAL TUNNEL RELEASE;  Surgeon: Taft FORBES Minerva, MD;  Location: [facility];  Service: Orthopedics;  Laterality: Left;   procedure 1   CESAREAN SECTION     CHOLECYSTECTOMY     COLONOSCOPY WITH PROPOFOL   09/16/2012   DOQ:Qpcz sessile polyps ranging between 3-93mm in size were found in the sigmoid colon and rectum (hyperplastic)/Mild diverticulosis was noted throughout the entire examined colon/Small internal hemorrhoids. Repeat in 10 years.   COLONOSCOPY WITH PROPOFOL  N/A 09/24/2022   Procedure: COLONOSCOPY WITH PROPOFOL ;  Surgeon: Cindie Carlin POUR, DO;  [facility];  Service: Endoscopy;  Laterality: N/A;  12:15pm, asa 3   DILATATION & CURETTAGE/HYSTEROSCOPY WITH MYOSURE N/A 08/05/2019   Procedure: DILATATION & CURETTAGE/HYSTEROSCOPY WITH MYOSURE;  Surgeon: Nicholaus Burnard HERO, MD;  Location: [facility];  Service: Gynecology;  Laterality: N/A;  myosure rep will be here.  Confirmed on 07/30/19 CS   DILATION AND CURETTAGE OF UTERUS     multiple   ESOPHAGOGASTRODUODENOSCOPY  07/29/2009   Mild gastritis, benign path   ESOPHAGOGASTRODUODENOSCOPY (EGD) WITH PROPOFOL   09/16/2012   SLF:Non-erosive gastritis (inflammation) was found; multiple bx/The duodenal mucosa showed no abnormalities in the ampulla and bulb and second portion of the duodenum/NAUSEA/VOMITING MOST LIKELY DUE TO GERD/GASTRITIS   ESOPHAGOGASTRODUODENOSCOPY (EGD) WITH PROPOFOL  N/A 04/25/2021   Surgeon: Cindie Carlin POUR, DO; Gastritis biopsied, normal examined duodenum  biopsied.  Pathology revealed focal chronic gastritis, negative for H. Pylori, benign small bowel mucosa.   FINGER SURGERY Left    left little finger-otif of finger   Gastric Emptying  07/27/2009   The amount of activity in the stomach at 120 minutes was 13% which is in the normal range   LASER ABLATION OF THE CERVIX     LEFT AND RIGHT HEART CATHETERIZATION WITH CORONARY ANGIOGRAM N/A 01/03/2015   Procedure: LEFT AND RIGHT HEART CATHETERIZATION WITH CORONARY ANGIOGRAM;  Surgeon: Ozell JONETTA Fell, MD;  Location: [facility] C[facility];  Service: Cardiovascular;  Laterality: N/A;    LITHOTRIPSY     POLYPECTOMY  09/16/2012   Procedure: POLYPECTOMY;  Surgeon: Margo LITTIE Haddock, MD;  Location: [facility];  Service: Endoscopy;  Laterality: N/A;   POLYPECTOMY  09/24/2022   Procedure: POLYPECTOMY;  Surgeon: Cindie Carlin POUR, DO;  [facility];  Service: Endoscopy;;   REVERSE SHOULDER ARTHROPLASTY Right 05/07/2024   Procedure: ARTHROPLASTY, SHOULDER, TOTAL, REVERSE;  Surgeon: Onesimo Oneil LABOR, MD;  Location: [facility];  Service: Orthopedics;  Laterality: Right;   SAVORY DILATION  09/16/2012   Procedure: SAVORY DILATION;  Surgeon: Margo LITTIE Haddock, MD;  Location: [facility];  Service: Endoscopy;  Laterality: N/A;  16 fr dilation   TRIGGER FINGER RELEASE Left 02/06/2013   Procedure: RELEASE TRIGGER FINGER LEFT LONG FINGER/A-1 PULLEY;  Surgeon: Taft FORBES Minerva, MD;  Location: [facility];  Service: Orthopedics;  Laterality: Left;  procedure began 1136   TRIGGER FINGER RELEASE Right 09/14/2015   Procedure: RIGHT LONG FINGER TRIGGER RELEASE;  Surgeon: Taft FORBES Minerva, MD;  Location: [facility];  Service: Orthopedics;  Laterality: Right;   TRIGGER FINGER RELEASE Left 09/28/2015   Procedure: LEFT RING TRIGGER FINGER RELEASE;  Surgeon: Taft FORBES Minerva, MD;  Location: [facility];  Service: Orthopedics;  Laterality: Left;  left ring finger   TRIGGER FINGER RELEASE Right 08/07/2023   Procedure: RELEASE TRIGGER FINGER/A-1 PULLEY Right thumb;  Surgeon: Minerva Taft FORBES, MD;  Location: [facility];  Service: Orthopedics;  Laterality: Right;   TUBAL LIGATION     Patient Active Problem List   Diagnosis Date Noted   Vasovagal syncope 06/08/2024   Anemia 06/08/2024   Urinary tract infection without hematuria 06/08/2024   Right rotator cuff tear 05/07/2024   Multiple wounds of skin 09/25/2023   Cellulitis of upper back excluding scapular region 09/25/2023   Trigger finger of right thumb 08/07/2023   Constipation 11/28/2022   Alternating constipation and diarrhea 09/03/2022   Dizziness 04/25/2022    History of colonic polyps 03/14/2022   Long-term current use of opiate analgesic 07/07/2021   Gastroesophageal reflux disease 03/27/2021   Left foot drop 03/17/2020   Former smoker    Chronic cystitis with hematuria 01/22/2020   Nephrolithiasis 01/22/2020   Hyperlipidemia associated with type 2 diabetes mellitus (HCC) 12/08/2019   Morbid obesity due to excess calories (HCC) 09/05/2015   Chronic pain syndrome 03/30/2015   History of CVA (cerebrovascular accident) without residual deficits 12/23/2014   Dyspnea on exertion 11/29/2014   Lumbar pain with radiation down left leg 02/16/2013   Gastroparesis 07/19/2009   Type 2 diabetes mellitus with vascular disease (HCC) 07/15/2009   ANXIETY 07/15/2009   Anxiety and depression 07/15/2009   Essential hypertension 07/15/2009   HIATAL HERNIA 07/15/2009   Fatty liver 07/15/2009    PCP: Alphonsa Hamilton, MD REFERRING PROVIDER: Onesimo Oneil, MD  ONSET DATE: 05/07/24  REFERRING DIAG: R Reverse Shoulder Arthroplasty  THERAPY DIAG:  Acute pain of right shoulder  Shoulder stiffness, right  Other symptoms and signs involving the musculoskeletal system  Rationale for Evaluation and Treatment: Rehabilitation  SUBJECTIVE:   SUBJECTIVE STATEMENT: It hurt real bad last night   PERTINENT HISTORY: Pt s/p R Reverse Shoulder Arthroplasty on 05/07/24  PRECAUTIONS: Shoulder - See protocol  WEIGHT BEARING RESTRICTIONS: Yes >1#  PAIN:  Are you having pain? No  FALLS: Has patient fallen in last 6 months? Yes. Number of falls 5  PLOF: Independent  PATIENT GOALS: To improve mobility  NEXT MD VISIT: 07/28/24  OBJECTIVE:   HAND DOMINANCE: Right  ADLs: Overall ADLs: Pt having most problems with lifting and carrying items for cooking and cleaning.   FUNCTIONAL OUTCOME MEASURES: Upper Extremity Functional Scale (UEFS): 44/80 - 55% UEFS: 21/80 - 26.3%  UPPER EXTREMITY ROM:       Assessed in supine, er/IR adducted  Passive ROM  Right eval  Shoulder flexion 151  Shoulder abduction 153  Shoulder internal rotation 90  Shoulder external rotation 41  (Blank rows = not tested)    Assessed in seated, er/IR adducted  Active ROM Right eval Right 07/09/24  Shoulder flexion  142  Shoulder abduction  160  Shoulder internal rotation  90  Shoulder external rotation  58  (Blank rows = not tested)  UPPER EXTREMITY MMT:     Assessed in seated, er/IR adducted  MMT Right eval Right 07/09/24  Shoulder flexion  4/5  Shoulder abduction  4+/5  Shoulder internal rotation  4+/5  Shoulder external rotation  4-/5  (Blank rows = not tested)    OBSERVATIONS: Moderate fascial restrictions along biceps and trapezius.    TODAY'S TREATMENT:                                                                                                                              DATE:   07/27/24 -Manual Therapy: myofascial release and trigger point applied to the biceps, triceps, subscapular region, and trapezius, in order to reduce pain and fascial restrictions, as well as improve ROM.  -A/ROM: seated- protraction, flexion, er/IR, abduction, horizontal abduction, x15 -X to V arms x 15 -Goal Post arms x15 -Shoulder Strengthening: yellow band, flexion, abduction, red band, er, IR, horizontal abduction, x15 -Scapular Strengthening: red band, extension, retraction, rows, x15 -UBE: Level 2, 2' forwards and backwards  07/16/24 -Manual Therapy: myofascial release and trigger point applied to the biceps, triceps, subscapular region, and trapezius, in order to reduce pain and fascial restrictions, as well as improve ROM.  -A/ROM: supine- protraction, flexion, er/IR, abduction, horizontal abduction, x15 -X to V arms x 15 -Goal Post arms x15 -Stretching: flexion, abduction, x10 -Theraball Exercises: yellow ball, flexion, protraction, overhead press, V ups, circles both directions, x12  07/13/24 -Manual Therapy: myofascial release and trigger  point applied to the biceps, triceps, subscapular region, and trapezius, in order to reduce pain and fascial restrictions, as well as improve ROM.  -A/ROM: supine- protraction,  flexion, er/IR, abduction, horizontal abduction, x15 -X to V arms x 15 -Goal Post arms x15 -Proximal Shoulder Exercises: paddles, criss cross, circles both directions, x10 -PNF Strengthening:  red band, chest pulls, overhead pulls, er pulls, PNF up, PNF down, x10  07/09/24 -Manual Therapy: myofascial release and trigger point applied to the biceps, triceps, subscapular region, and trapezius, in order to reduce pain and fascial restrictions, as well as improve ROM.  -A/ROM: supine- protraction, flexion, er/IR, abduction, horizontal abduction, x15 -X to V arms x 15 -Goal Post arms x15 -Reassessment   PATIENT EDUCATION: Education details: Public relations account executive Person educated: Patient Education method: Explanation, Demonstration, and Handouts Education comprehension: verbalized understanding and returned demonstration  HOME EXERCISE PROGRAM: 7/11: Table Slides and Pendulums 7/16: AA/ROM 7/31: A/ROM 8/8: Scapular Strengthening 8/26: PNF Strengthening 9/8: Shoulder Strengthening  GOALS: Goals reviewed with patient? Yes   SHORT TERM GOALS: Target date: 06/18/24  Pt will be provided with and educated on HEP to improve mobility in RUE required for use during ADL completion.   Goal status: IN PROGRESS  3.  Pt will increase RUE strength to 4-/5 to improve ability to reach for items at waist to chest height during bathing and grooming tasks.   Goal status: MET   LONG TERM GOALS: Target date: 07/10/24  Pt will decrease pain in RUE to 3/10 or less to improve ability to sleep for 2+ consecutive hours without waking due to pain.   Goal status: IN PROGRESS  2.  Pt will decrease RUE fascial restrictions to min amounts or less to improve mobility required for functional reaching tasks.   Goal status: IN  PROGRESS  3.  Pt will increase RUE A/ROM by 10 degrees to improve ability to use RUE when reaching overhead or behind back during dressing and bathing tasks.   Goal status: MET  4.  Pt will increase RUE strength to 5/5 or greater to improve ability to use RUE when lifting or carrying items during meal preparation/housework/yardwork tasks.   Goal status: IN PROGRESS  5.  Pt will return to highest level of function using RUE as dominant during functional task completion.   Goal status: IN PROGRESS   ASSESSMENT:  CLINICAL IMPRESSION: Pt is continuing to make good improvements. Her ROM is [facility] and she is tolerating strengthening exercises well. OT addressed her moderate fascial restrictions in the RUE with manual therapy, which assisted in pain relief and activity tolerance. Pt was able to complete shoulder resistance exercises this session with fair technique and slightly increased time due to fatigue. Verbal and tactile cuing provided for positioning and technique.   PERFORMANCE DEFICITS: in functional skills including in functional skills including ADLs, IADLs, coordination, tone, ROM, strength, pain, fascial restrictions, muscle spasms, and UE functional use.   PLAN:  OT FREQUENCY: 2x/week  OT DURATION: 6 weeks  PLANNED INTERVENTIONS: 97168 OT Re-evaluation, 97535 self care/ADL training, 02889 therapeutic exercise, 97530 therapeutic activity, 97112 neuromuscular re-education, 97140 manual therapy, 97010 moist heat, 97032 electrical stimulation (manual), passive range of motion, functional mobility training, energy conservation, coping strategies training, patient/family education, and DME and/or AE instructions  CONSULTED AND AGREED WITH PLAN OF CARE: Patient  PLAN FOR NEXT SESSION: follow up on HEP, manual techniques, A/ROM, functional reaching, strengthening and stability work   Valentin Nightingale, OTR/L [phone] 07/27/2024, 2:41 PM

## 2024-07-27 NOTE — Patient Instructions (Signed)

## 2024-07-28 ENCOUNTER — Encounter: Payer: Self-pay | Admitting: Orthopedic Surgery

## 2024-07-28 ENCOUNTER — Other Ambulatory Visit (INDEPENDENT_AMBULATORY_CARE_PROVIDER_SITE_OTHER): Payer: Self-pay

## 2024-07-28 ENCOUNTER — Ambulatory Visit (INDEPENDENT_AMBULATORY_CARE_PROVIDER_SITE_OTHER): Admitting: Orthopedic Surgery

## 2024-07-28 DIAGNOSIS — M19019 Primary osteoarthritis, unspecified shoulder: Secondary | ICD-10-CM

## 2024-07-28 DIAGNOSIS — S46011D Strain of muscle(s) and tendon(s) of the rotator cuff of right shoulder, subsequent encounter: Secondary | ICD-10-CM | POA: Diagnosis not present

## 2024-07-28 DIAGNOSIS — Z96611 Presence of right artificial shoulder joint: Secondary | ICD-10-CM

## 2024-07-28 NOTE — Progress Notes (Signed)
 Orthopaedic Postop Note  Assessment: Jamie Harding is a 68 y.o. female s/p Right Reverse Shoulder Arthroplasty  DOS: 05/01/2024  Plan: Jamie Harding has done well in her recovery.  She has minimal pain.  Her improvements in motion are very good.  She is lacking some internal rotation.  Her strength is improving.  She is pleased with her improvements.  I think she has done very well thus far.  I would like see her back in 3 months.  Follow-up: Return in about 3 months (around 10/27/2024).  XR at next visit: Right shoulder  Subjective:  Chief Complaint  Patient presents with   Routine Post Op    R RSA DOS 05/01/24    History of Present Illness: Jamie Harding is a 68 y.o. female who presents following the above stated procedure.  Surgery was approximately 3 months ago.  She has done well.  She continues to work with therapy.  She takes Tylenol  occasionally.  She is doing exercises on her own.  She is happy with her improvements.  The only thing she is struggling to do is get her hand behind her back.  Review of Systems: No fevers or chills No numbness or tingling No Chest Pain No shortness of breath   Objective: There were no vitals taken for this visit.  Physical Exam:  Alert and oriented, no acute distress  Surgical incision is healing well, no surrounding erythema or drainage Sensation intact in the axillary nerve distribution Active motion intact in the hand 2+ radial pulse Sensation intact throughout the hand 160 degrees of forward flexion.  100 degrees of abduction.  Internal rotation to back pocket.  4/5 subscapularis strength.  IMAGING: I personally ordered and reviewed the following images:  X-rays of the right shoulder were obtained in clinic today.  These are compared to available x-rays.  Reverse shoulder arthroplasty remains in unchanged alignment.  No periprosthetic lucency.  No subsidence.  No new fractures.  No bony lesions.  Impression: Stable right  reverse shoulder arthroplasty without subsidence.  Jamie DELENA Horde, MD 07/28/2024 1:46 PM

## 2024-07-29 ENCOUNTER — Telehealth: Payer: Self-pay

## 2024-07-29 NOTE — Progress Notes (Signed)
   07/29/2024  Patient ID: Jamie Harding, female   DOB: Jan 23, 1956, 68 y.o.   MRN: [id number]  This patient is appearing on a report for being at risk of failing the adherence measure for identified medications this calendar year.   Medication Adherence Summary (STAR/HEDIS Monitoring): Adherence Category: cholesterol (statin)    Drug Name: ROSUVASTATIN  CALCIUM  20 MG TAB Sold Date:07/27/2024 Days' Supply: 90     Notes: ? Adherence data pulled from pharmacy claims portal Dr. Annemarie. ? Reviewed barriers to adherence: N/A. ? Plan: Will follow up prior next refill  Dorcas Solian, PharmD Clinical Pharmacist Cell: [phone]

## 2024-07-30 ENCOUNTER — Encounter (HOSPITAL_COMMUNITY): Payer: Self-pay

## 2024-07-30 ENCOUNTER — Encounter (HOSPITAL_COMMUNITY): Admitting: Occupational Therapy

## 2024-08-02 NOTE — Progress Notes (Deleted)
 GI Office Note    Referring Provider: Alphonsa Glendia LABOR, MD Primary Care Physician:  Alphonsa Glendia LABOR, MD  Primary Gastroenterologist: Carlin POUR. Cindie, DO   Chief Complaint   No chief complaint on file.   History of Present Illness   Jamie Harding is a 68 y.o. female presenting today for follow up. Last seen in 12/2022. She has h/o GERD, chronic N/V, gastroparesis (abnormal GES 2021), constipation, h/o colon polyps.    She had incomplete colonoscopy 09/2022, bowel prep was poor. She will need extended bowel prep with next colonoscopy, to be complete between now and 03/2023. She completed trilyte prep before as per instructions.  Prior Data    Colonoscopy 09/2022: - Diverticulosis in the sigmoid colon. - One 5 mm polyp in the cecum, removed with a cold snare. Resected and retrieved. - Stool in the sigmoid colon, in the descending colon and in the transverse colon. -tubular adenoma -colonoscopy in 3-6 months.  EGD 04/2021:  -gastritis s/p bx no h.pylori -normal examined duodenum, s/p bx       Medications   Current Outpatient Medications  Medication Sig Dispense Refill   acetaminophen  (TYLENOL ) 500 MG tablet Take 1,000 mg by mouth every 6 (six) hours as needed for mild pain (pain score 1-3).     albuterol  (VENTOLIN  HFA) 108 (90 Base) MCG/ACT inhaler INHALE 2 PUFFS INTO THE LUNGS EVERY 4 (FOUR) HOURS AS NEEDED FOR WHEEZING. 54 g 5   Blood Glucose Monitoring Suppl (ONETOUCH VERIO FLEX SYSTEM) w/Device KIT USE AS DIRECTED TEST BLOOD SUGAR 4 TIMES DAILY. 1 kit 0   busPIRone  (BUSPAR ) 10 MG tablet Take 1 tablet BID PRN 60 tablet 5   clopidogrel  (PLAVIX ) 75 MG tablet Take 1 tablet (75 mg total) by mouth daily with breakfast. 90 tablet 1   dicyclomine  (BENTYL ) 10 MG capsule Take 1 capsule (10 mg total) by mouth 3 (three) times daily as needed (for abdominal cramping or diarrhea). 90 capsule 0   fluconazole  (DIFLUCAN ) 150 MG tablet TAKE ONE TABLET BY MOUTH ONCE FOR 1 DOSE 1  tablet 3   HYDROcodone -acetaminophen  (NORCO) 10-325 MG tablet 1 qid prn pain 120 tablet 0   HYDROcodone -acetaminophen  (NORCO) 10-325 MG tablet 1 4 times a day as needed for pain 120 tablet 0   HYDROcodone -acetaminophen  (NORCO) 10-325 MG tablet 1 taken 4 times a day as needed for pain 120 tablet 0   metFORMIN  (GLUCOPHAGE ) 1000 MG tablet TAKE ONE TABLET BY MOUTH TWO TIMES A DAY. 180 tablet 3   Multiple Vitamin (MULTIVITAMIN WITH MINERALS) TABS Take 1 tablet by mouth daily.     ondansetron  (ZOFRAN ) 8 MG tablet TAKE ONE TABLET BY MOUTH EVERY 8 HOURS AS NEEDED FOR NAUSEA/VOMITING. (Patient taking differently: Take 8 mg by mouth every 8 (eight) hours as needed for nausea or vomiting. Take one tablet by mouth every 8 hours as needed for nausea/vomiting.) 60 tablet 1   ONETOUCH VERIO test strip USE AS DIRECTED TO TEST BLOOD GLUCOSE 4 TIMES DAILY. 200 strip 0   pantoprazole  (PROTONIX ) 40 MG tablet TAKE (1) TABLET BY MOUTH TWICE A DAY BEFORE A MEAL. 180 tablet 0   pregabalin  (LYRICA ) 25 MG capsule Take 1 capsule (25 mg total) by mouth 2 (two) times daily. 60 capsule 5   promethazine  (PHENERGAN ) 25 MG tablet Take 1 tablet (25 mg total) by mouth every 8 (eight) hours as needed. for nausea 30 tablet 3   rosuvastatin  (CRESTOR ) 20 MG tablet Take 1 tablet (20 mg  total) by mouth daily. 90 tablet 1   venlafaxine  (EFFEXOR ) 75 MG tablet Take 1-2 tablets (75-150 mg total) by mouth 2 (two) times daily with a meal. 150 mg am, 75 mg pm 270 tablet 1   No current facility-administered medications for this visit.    Allergies   Allergies as of 08/03/2024 - Review Complete 07/28/2024  Allergen Reaction Noted   Augmentin  [amoxicillin -pot clavulanate] Diarrhea 03/16/2014   Byetta 10 mcg pen [exenatide] Diarrhea and Nausea And Vomiting 02/02/2013   Naproxen Nausea And Vomiting 07/15/2009   Pollen extract Other (See Comments) 05/05/2020   Azithromycin Nausea And Vomiting and Rash 07/15/2009   Erythromycin Hives 02/02/2013    Invokana  [canagliflozin ] Other (See Comments) 09/13/2014   Morphine Hives and Rash    Sulfonamide derivatives Nausea And Vomiting and Rash      Past Medical History   Past Medical History:  Diagnosis Date   Anxiety    Asthma    Depression    Essential hypertension    Fatty liver    Gastritis    Gastroparesis    GERD (gastroesophageal reflux disease)    Hiatal hernia    History of cardiac catheterization    Minimal coronary atherosclerosis February 2016   History of kidney stones    History of stroke 2008   HLD (hyperlipidemia)    Neuropathy    Obesity    Pancreatitis    Rheumatoid arthritis(714.0)    Stroke (HCC) 2008   Stroke (HCC) 01/2020   Tibialis tendinitis    Type 2 diabetes mellitus (HCC)     Past Surgical History   Past Surgical History:  Procedure Laterality Date   ANKLE SURGERY     remove extra bone-left   BIOPSY  04/25/2021   Procedure: BIOPSY;  Surgeon: Cindie Carlin POUR, DO;  [facility]: [facility];  Service: Endoscopy;;   CARPAL TUNNEL RELEASE     right side   CARPAL TUNNEL RELEASE Left 02/06/2013   Procedure: CARPAL TUNNEL RELEASE ;  Surgeon: Taft FORBES Minerva, MD;  Location: [facility];  Service: Orthopedics;  Laterality: Left;  procedure end 1135   CARPAL TUNNEL RELEASE Right 09/14/2015   Procedure: RIGHT CARPAL TUNNEL RELEASE;  Surgeon: Taft FORBES Minerva, MD;  Location: [facility];  Service: Orthopedics;  Laterality: Right;   CARPAL TUNNEL RELEASE Left 09/28/2015   Procedure: LEFT CARPAL TUNNEL RELEASE;  Surgeon: Taft FORBES Minerva, MD;  Location: [facility];  Service: Orthopedics;  Laterality: Left;  procedure 1   CESAREAN SECTION     CHOLECYSTECTOMY     COLONOSCOPY WITH PROPOFOL   09/16/2012   DOQ:Qpcz sessile polyps ranging between 3-65mm in size were found in the sigmoid colon and rectum (hyperplastic)/Mild diverticulosis was noted throughout the entire examined colon/Small internal hemorrhoids. Repeat in 10 years.   COLONOSCOPY WITH PROPOFOL  N/A  09/24/2022   Procedure: COLONOSCOPY WITH PROPOFOL ;  Surgeon: Cindie Carlin POUR, DO;  [facility]: [facility];  Service: Endoscopy;  Laterality: N/A;  12:15pm, asa 3   DILATATION & CURETTAGE/HYSTEROSCOPY WITH MYOSURE N/A 08/05/2019   Procedure: DILATATION & CURETTAGE/HYSTEROSCOPY WITH MYOSURE;  Surgeon: Nicholaus Burnard HERO, MD;  Location: [facility];  Service: Gynecology;  Laterality: N/A;  myosure rep will be here.  Confirmed on 07/30/19 CS   DILATION AND CURETTAGE OF UTERUS     multiple   ESOPHAGOGASTRODUODENOSCOPY  07/29/2009   Mild gastritis, benign path   ESOPHAGOGASTRODUODENOSCOPY (EGD) WITH PROPOFOL   09/16/2012   SLF:Non-erosive gastritis (inflammation) was found; multiple bx/The duodenal mucosa  showed no abnormalities in the ampulla and bulb and second portion of the duodenum/NAUSEA/VOMITING MOST LIKELY DUE TO GERD/GASTRITIS   ESOPHAGOGASTRODUODENOSCOPY (EGD) WITH PROPOFOL  N/A 04/25/2021   Surgeon: Cindie Carlin POUR, DO; Gastritis biopsied, normal examined duodenum biopsied.  Pathology revealed focal chronic gastritis, negative for H. Pylori, benign small bowel mucosa.   FINGER SURGERY Left    left little finger-otif of finger   Gastric Emptying  07/27/2009   The amount of activity in the stomach at 120 minutes was 13% which is in the normal range   LASER ABLATION OF THE CERVIX     LEFT AND RIGHT HEART CATHETERIZATION WITH CORONARY ANGIOGRAM N/A 01/03/2015   Procedure: LEFT AND RIGHT HEART CATHETERIZATION WITH CORONARY ANGIOGRAM;  Surgeon: Ozell JONETTA Fell, MD;  Location[facility];  Service: Cardiovascular;  Laterality: N/A;   LITHOTRIPSY     POLYPECTOMY  09/16/2012   Procedure: POLYPECTOMY;  Surgeon: Margo LITTIE Haddock, MD;  Location: [facility];  Service: Endoscopy;  Laterality: N/A;   POLYPECTOMY  09/24/2022   Procedure: POLYPECTOMY;  Surgeon: Cindie Carlin POUR, DO;  [facility];  Service: Endoscopy;;   REVERSE SHOULDER ARTHROPLASTY Right 05/07/2024   Procedure:  ARTHROPLASTY, SHOULDER, TOTAL, REVERSE;  Surgeon: Onesimo Oneil LABOR, MD;  Location: [facility];  Service: Orthopedics;  Laterality: Right;   SAVORY DILATION  09/16/2012   Procedure: SAVORY DILATION;  Surgeon: Margo LITTIE Haddock, MD;  Location: [facility];  Service: Endoscopy;  Laterality: N/A;  16 fr dilation   TRIGGER FINGER RELEASE Left 02/06/2013   Procedure: RELEASE TRIGGER FINGER LEFT LONG FINGER/A-1 PULLEY;  Surgeon: Taft FORBES Minerva, MD;  Location: [facility];  Service: Orthopedics;  Laterality: Left;  procedure began 1136   TRIGGER FINGER RELEASE Right 09/14/2015   Procedure: RIGHT LONG FINGER TRIGGER RELEASE;  Surgeon: Taft FORBES Minerva, MD;  Location: [facility];  Service: Orthopedics;  Laterality: Right;   TRIGGER FINGER RELEASE Left 09/28/2015   Procedure: LEFT RING TRIGGER FINGER RELEASE;  Surgeon: Taft FORBES Minerva, MD;  Location: [facility];  Service: Orthopedics;  Laterality: Left;  left ring finger   TRIGGER FINGER RELEASE Right 08/07/2023   Procedure: RELEASE TRIGGER FINGER/A-1 PULLEY Right thumb;  Surgeon: Minerva Taft FORBES, MD;  Location: [facility];  Service: Orthopedics;  Laterality: Right;   TUBAL LIGATION      Past Family History   Family History  Problem Relation Age of Onset   Breast cancer Mother    Diabetes Father    Coronary artery disease Other    Arthritis Other    Asthma Other    Diabetes Sister    Colon cancer Neg Hx     Past Social History   Social History   Socioeconomic History   Marital status: Married    Spouse name: Not on file   Number of children: Not on file   Years of education: Not on file   Highest education level: 12th grade  Occupational History   Occupation: nurse tech    Comment: [employer], 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)    Employer: [employer]  Tobacco Use   Smoking status: Former    Current packs/day: 0.00    Average packs/day: 0.5 packs/day for 3.0 years (1.5 ttl pk-yrs)    Types: Cigarettes    Start date: 09/10/1984    Quit date:  09/11/1987    Years since quitting: 36.9   Smokeless tobacco: Never  Vaping Use   Vaping status: Never Used  Substance and Sexual Activity  Alcohol use: No   Drug use: No   Sexual activity: Not Currently    Birth control/protection: Surgical  Other Topics Concern   Not on file  Social History Narrative   Not on file   Social Drivers of Health   Financial Resource Strain: Medium Risk (05/12/2024)   Overall Financial Resource Strain (CARDIA)    Difficulty of Paying Living Expenses: Somewhat hard  Food Insecurity: Food Insecurity Present (05/12/2024)   Hunger Vital Sign    Worried About Running Out of Food in the Last Year: Never true    Ran Out of Food in the Last Year: Sometimes true  Transportation Needs: No Transportation Needs (05/12/2024)   PRAPARE - Administrator, Civil Service (Medical): No    Lack of Transportation (Non-Medical): No  Physical Activity: Inactive (05/12/2024)   Exercise Vital Sign    Days of Exercise per Week: 0 days    Minutes of Exercise per Session: Not on file  Stress: Stress Concern Present (05/12/2024)   [employer] of Occupational Health - Occupational Stress Questionnaire    Feeling of Stress: Very much  Social Connections: Moderately Isolated (05/12/2024)   Social Connection and Isolation Panel    Frequency of Communication with Friends and Family: Once a week    Frequency of Social Gatherings with Friends and Family: Never    Attends Religious Services: More than 4 times per year    Active Member of [employer] or Organizations: No    Attends Engineer, structural: Not on file    Marital Status: Married  Catering manager Violence: Not At Risk (05/07/2024)   Humiliation, Afraid, Rape, and Kick questionnaire    Fear of Current or Ex-Partner: No    Emotionally Abused: No    Physically Abused: No    Sexually Abused: No    Review of Systems   General: Negative for anorexia, weight loss, fever, chills, fatigue,  weakness. ENT: Negative for hoarseness, difficulty swallowing , nasal congestion. CV: Negative for chest pain, angina, palpitations, dyspnea on exertion, peripheral edema.  Respiratory: Negative for dyspnea at rest, dyspnea on exertion, cough, sputum, wheezing.  GI: See history of present illness. GU:  Negative for dysuria, hematuria, urinary incontinence, urinary frequency, nocturnal urination.  Endo: Negative for unusual weight change.     Physical Exam   There were no vitals taken for this visit.   General: Well-nourished, well-developed in no acute distress.  Eyes: No icterus. Mouth: Oropharyngeal mucosa moist and pink   Lungs: Clear to auscultation bilaterally.  Heart: Regular rate and rhythm, no murmurs rubs or gallops.  Abdomen: Bowel sounds are normal, nontender, nondistended, no hepatosplenomegaly or masses,  no abdominal bruits or hernia , no rebound or guarding.  Rectal: not performed Extremities: No lower extremity edema. No clubbing or deformities. Neuro: Alert and oriented x 4   Skin: Warm and dry, no jaundice.   Psych: Alert and cooperative, normal mood and affect.  Labs   Lab Results  Component Value Date   NA 141 05/19/2024   CL 102 05/19/2024   K 3.8 05/19/2024   CO2 27 05/19/2024   BUN 8 05/19/2024   CREATININE 0.80 05/19/2024   GFRNONAA >60 05/19/2024   CALCIUM  8.6 (L) 05/19/2024   ALBUMIN 3.2 (L) 05/19/2024   GLUCOSE 154 (H) 05/19/2024   Lab Results  Component Value Date   WBC 8.2 05/19/2024   HGB 12.2 05/19/2024   HCT 36.0 05/19/2024   MCV 91.6 05/19/2024   PLT 398  05/19/2024   Lab Results  Component Value Date   ALT 12 05/19/2024   AST 14 (L) 05/19/2024   ALKPHOS 62 05/19/2024   BILITOT 0.4 05/19/2024   Lab Results  Component Value Date   VITAMINB12 466 06/08/2024   Lab Results  Component Value Date   IRON 78 06/08/2024   TIBC 276 06/08/2024   FERRITIN 116 06/08/2024   No results found for: FOLATE  Imaging Studies   No  results found.  Assessment/Plan:           Sonny RAMAN. Ezzard, MHS, PA-C [facility]

## 2024-08-03 ENCOUNTER — Other Ambulatory Visit: Payer: Self-pay | Admitting: Family Medicine

## 2024-08-03 ENCOUNTER — Ambulatory Visit: Admitting: Gastroenterology

## 2024-08-03 DIAGNOSIS — K3184 Gastroparesis: Secondary | ICD-10-CM

## 2024-08-03 DIAGNOSIS — K219 Gastro-esophageal reflux disease without esophagitis: Secondary | ICD-10-CM

## 2024-08-04 ENCOUNTER — Ambulatory Visit (HOSPITAL_COMMUNITY): Admitting: Occupational Therapy

## 2024-08-04 ENCOUNTER — Encounter (HOSPITAL_COMMUNITY): Payer: Self-pay | Admitting: Occupational Therapy

## 2024-08-04 DIAGNOSIS — M25511 Pain in right shoulder: Secondary | ICD-10-CM

## 2024-08-04 DIAGNOSIS — R29898 Other symptoms and signs involving the musculoskeletal system: Secondary | ICD-10-CM

## 2024-08-04 DIAGNOSIS — M25611 Stiffness of right shoulder, not elsewhere classified: Secondary | ICD-10-CM

## 2024-08-04 NOTE — Patient Instructions (Signed)

## 2024-08-04 NOTE — Therapy (Unsigned)
 OUTPATIENT OCCUPATIONAL THERAPY ORTHO TREATMENT NOTE DISCHARGE NOTE   Patient Name: Jamie Harding MRN: [id number] DOB:1956-11-13, 69 y.o., female Today's Date: 08/04/2024  OCCUPATIONAL THERAPY DISCHARGE SUMMARY  Visits from Start of Care: 13  Current functional level related to goals / functional outcomes: Pt has met all OT goals.    Remaining deficits: Pt has limited reach behind her back.    Education / Equipment: Pt has been provided a comprehensive HEP.   Plan: Patient agrees to discharge as all OT goals have been met.        END OF SESSION:   OT End of Session - 08/04/24 1626     Visit Number 13    Number of Visits 17    Date for OT Re-Evaluation 08/14/24    Authorization Type [employer]    OT Start Time 1539    OT Stop Time 1608    OT Time Calculation (min) 29 min    Activity Tolerance Patient tolerated treatment well    Behavior During Therapy [facility] for tasks assessed/performed           Past Medical History:  Diagnosis Date   Anxiety    Asthma    Depression    Essential hypertension    Fatty liver    Gastritis    Gastroparesis    GERD (gastroesophageal reflux disease)    Hiatal hernia    History of cardiac catheterization    Minimal coronary atherosclerosis February 2016   History of kidney stones    History of stroke 2008   HLD (hyperlipidemia)    Neuropathy    Obesity    Pancreatitis    Rheumatoid arthritis(714.0)    Stroke (HCC) 2008   Stroke (HCC) 01/2020   Tibialis tendinitis    Type 2 diabetes mellitus (HCC)    Past Surgical History:  Procedure Laterality Date   ANKLE SURGERY     remove extra bone-left   BIOPSY  04/25/2021   Procedure: BIOPSY;  Surgeon: Cindie Carlin POUR, DO;  [facility];  Service: Endoscopy;;   CARPAL TUNNEL RELEASE     right side   CARPAL TUNNEL RELEASE Left 02/06/2013   Procedure: CARPAL TUNNEL RELEASE ;  Surgeon: Taft FORBES Minerva, MD;  Location: [facility];  Service: Orthopedics;   Laterality: Left;  procedure end 1135   CARPAL TUNNEL RELEASE Right 09/14/2015   Procedure: RIGHT CARPAL TUNNEL RELEASE;  Surgeon: Taft FORBES Minerva, MD;  Location: [facility];  Service: Orthopedics;  Laterality: Right;   CARPAL TUNNEL RELEASE Left 09/28/2015   Procedure: LEFT CARPAL TUNNEL RELEASE;  Surgeon: Taft FORBES Minerva, MD;  Location: [facility];  Service: Orthopedics;  Laterality: Left;  procedure 1   CESAREAN SECTION     CHOLECYSTECTOMY     COLONOSCOPY WITH PROPOFOL   09/16/2012   DOQ:Qpcz sessile polyps ranging between 3-31mm in size were found in the sigmoid colon and rectum (hyperplastic)/Mild diverticulosis was noted throughout the entire examined colon/Small internal hemorrhoids. Repeat in 10 years.   COLONOSCOPY WITH PROPOFOL  N/A 09/24/2022   Procedure: COLONOSCOPY WITH PROPOFOL ;  Surgeon: Cindie Carlin POUR, DO;  [facility];  Service: Endoscopy;  Laterality: N/A;  12:15pm, asa 3   DILATATION & CURETTAGE/HYSTEROSCOPY WITH MYOSURE N/A 08/05/2019   Procedure: DILATATION & CURETTAGE/HYSTEROSCOPY WITH MYOSURE;  Surgeon: Nicholaus Burnard HERO, MD;  Location: [facility];  Service: Gynecology;  Laterality: N/A;  myosure rep will be here.  Confirmed on 07/30/19 CS   DILATION AND CURETTAGE  OF UTERUS     multiple   ESOPHAGOGASTRODUODENOSCOPY  07/29/2009   Mild gastritis, benign path   ESOPHAGOGASTRODUODENOSCOPY (EGD) WITH PROPOFOL   09/16/2012   SLF:Non-erosive gastritis (inflammation) was found; multiple bx/The duodenal mucosa showed no abnormalities in the ampulla and bulb and second portion of the duodenum/NAUSEA/VOMITING MOST LIKELY DUE TO GERD/GASTRITIS   ESOPHAGOGASTRODUODENOSCOPY (EGD) WITH PROPOFOL  N/A 04/25/2021   Surgeon: Cindie Carlin POUR, DO; Gastritis biopsied, normal examined duodenum biopsied.  Pathology revealed focal chronic gastritis, negative for H. Pylori, benign small bowel mucosa.   FINGER SURGERY Left    left little finger-otif of finger   Gastric  Emptying  07/27/2009   The amount of activity in the stomach at 120 minutes was 13% which is in the normal range   LASER ABLATION OF THE CERVIX     LEFT AND RIGHT HEART CATHETERIZATION WITH CORONARY ANGIOGRAM N/A 01/03/2015   Procedure: LEFT AND RIGHT HEART CATHETERIZATION WITH CORONARY ANGIOGRAM;  Surgeon: Ozell JONETTA Fell, MD;  Location: [facility];  Service: Cardiovascular;  Laterality: N/A;   LITHOTRIPSY     POLYPECTOMY  09/16/2012   Procedure: POLYPECTOMY;  Surgeon: Margo LITTIE Haddock, MD;  Location: [facility];  Service: Endoscopy;  Laterality: N/A;   POLYPECTOMY  09/24/2022   Procedure: POLYPECTOMY;  Surgeon: Cindie Carlin POUR, DO;  [facility];  Service: Endoscopy;;   REVERSE SHOULDER ARTHROPLASTY Right 05/07/2024   Procedure: ARTHROPLASTY, SHOULDER, TOTAL, REVERSE;  Surgeon: Onesimo Oneil LABOR, MD;  Location: [facility];  Service: Orthopedics;  Laterality: Right;   SAVORY DILATION  09/16/2012   Procedure: SAVORY DILATION;  Surgeon: Margo LITTIE Haddock, MD;  Location: [facility];  Service: Endoscopy;  Laterality: N/A;  16 fr dilation   TRIGGER FINGER RELEASE Left 02/06/2013   Procedure: RELEASE TRIGGER FINGER LEFT LONG FINGER/A-1 PULLEY;  Surgeon: Taft FORBES Minerva, MD;  Location: [facility];  Service: Orthopedics;  Laterality: Left;  procedure began 1136   TRIGGER FINGER RELEASE Right 09/14/2015   Procedure: RIGHT LONG FINGER TRIGGER RELEASE;  Surgeon: Taft FORBES Minerva, MD;  Location: [facility];  Service: Orthopedics;  Laterality: Right;   TRIGGER FINGER RELEASE Left 09/28/2015   Procedure: LEFT RING TRIGGER FINGER RELEASE;  Surgeon: Taft FORBES Minerva, MD;  Location: [facility];  Service: Orthopedics;  Laterality: Left;  left ring finger   TRIGGER FINGER RELEASE Right 08/07/2023   Procedure: RELEASE TRIGGER FINGER/A-1 PULLEY Right thumb;  Surgeon: Minerva Taft FORBES, MD;  Location: [facility];  Service: Orthopedics;  Laterality: Right;   TUBAL LIGATION     Patient Active Problem List   Diagnosis Date Noted    Vasovagal syncope 06/08/2024   Anemia 06/08/2024   Urinary tract infection without hematuria 06/08/2024   Right rotator cuff tear 05/07/2024   Multiple wounds of skin 09/25/2023   Cellulitis of upper back excluding scapular region 09/25/2023   Trigger finger of right thumb 08/07/2023   Constipation 11/28/2022   Alternating constipation and diarrhea 09/03/2022   Dizziness 04/25/2022   History of colonic polyps 03/14/2022   Long-term current use of opiate analgesic 07/07/2021   Gastroesophageal reflux disease 03/27/2021   Left foot drop 03/17/2020   Former smoker    Chronic cystitis with hematuria 01/22/2020   Nephrolithiasis 01/22/2020   Hyperlipidemia associated with type 2 diabetes mellitus (HCC) 12/08/2019   Morbid obesity due to excess calories (HCC) 09/05/2015   Chronic pain syndrome 03/30/2015   History of CVA (cerebrovascular accident) without residual deficits 12/23/2014   Dyspnea on exertion 11/29/2014  Lumbar pain with radiation down left leg 02/16/2013   Gastroparesis 07/19/2009   Type 2 diabetes mellitus with vascular disease (HCC) 07/15/2009   ANXIETY 07/15/2009   Anxiety and depression 07/15/2009   Essential hypertension 07/15/2009   HIATAL HERNIA 07/15/2009   Fatty liver 07/15/2009    PCP: Alphonsa Hamilton, MD REFERRING PROVIDER: Onesimo Anes, MD  ONSET DATE: 05/07/24  REFERRING DIAG: R Reverse Shoulder Arthroplasty  THERAPY DIAG:  Acute pain of right shoulder  Shoulder stiffness, right  Other symptoms and signs involving the musculoskeletal system  Rationale for Evaluation and Treatment: Rehabilitation  SUBJECTIVE:   SUBJECTIVE STATEMENT: I've really been feeling good.   PERTINENT HISTORY: Pt s/p R Reverse Shoulder Arthroplasty on 05/07/24  PRECAUTIONS: Shoulder - See protocol  WEIGHT BEARING RESTRICTIONS: Yes >1#  PAIN:  Are you having pain? No  FALLS: Has patient fallen in last 6 months? Yes. Number of falls 5  PLOF:  Independent  PATIENT GOALS: To improve mobility  NEXT MD VISIT: 07/28/24  OBJECTIVE:   HAND DOMINANCE: Right  ADLs: Overall ADLs: Pt having most problems with lifting and carrying items for cooking and cleaning.   FUNCTIONAL OUTCOME MEASURES: Upper Extremity Functional Scale (UEFS): 44/80 - 55% UEFS: 21/80 - 26.3%  UPPER EXTREMITY ROM:       Assessed in supine, er/IR adducted  Passive ROM Right eval  Shoulder flexion 151  Shoulder abduction 153  Shoulder internal rotation 90  Shoulder external rotation 41  (Blank rows = not tested)    Assessed in seated, er/IR adducted  Active ROM Right eval Right 07/09/24 Right 9/16  Shoulder flexion  142 155  Shoulder abduction  160 157  Shoulder internal rotation  90 90  Shoulder external rotation  58 66  (Blank rows = not tested)  UPPER EXTREMITY MMT:     Assessed in seated, er/IR adducted  MMT Right eval Right 07/09/24 Right 08/04/24  Shoulder flexion  4/5 5/5  Shoulder abduction  4+/5 5/5  Shoulder internal rotation  4+/5 4+/5  Shoulder external rotation  4-/5 5/5  (Blank rows = not tested)    OBSERVATIONS: Moderate fascial restrictions along biceps and trapezius.    TODAY'S TREATMENT:                                                                                                                              DATE:   08/04/24 -A/ROM: seated- protraction, flexion, er/IR, abduction, horizontal abduction, x15 -X to V arms x 15 -Goal Post arms x15 -Stretching: flexion, corner stretch, er doorway stretch, towel behind back, 4x10  07/27/24 -Manual Therapy: myofascial release and trigger point applied to the biceps, triceps, subscapular region, and trapezius, in order to reduce pain and fascial restrictions, as well as improve ROM.  -A/ROM: seated- protraction, flexion, er/IR, abduction, horizontal abduction, x15 -X to V arms x 15 -Goal Post arms x15 -Shoulder Strengthening: yellow band, flexion, abduction, red band,  er, IR, horizontal abduction, x15 -Scapular  Strengthening: red band, extension, retraction, rows, x15 -UBE: Level 2, 2' forwards and backwards  07/16/24 -Manual Therapy: myofascial release and trigger point applied to the biceps, triceps, subscapular region, and trapezius, in order to reduce pain and fascial restrictions, as well as improve ROM.  -A/ROM: supine- protraction, flexion, er/IR, abduction, horizontal abduction, x15 -X to V arms x 15 -Goal Post arms x15 -Stretching: flexion, abduction, x10 -Theraball Exercises: yellow ball, flexion, protraction, overhead press, V ups, circles both directions, x12  07/13/24 -Manual Therapy: myofascial release and trigger point applied to the biceps, triceps, subscapular region, and trapezius, in order to reduce pain and fascial restrictions, as well as improve ROM.  -A/ROM: supine- protraction, flexion, er/IR, abduction, horizontal abduction, x15 -X to V arms x 15 -Goal Post arms x15 -Proximal Shoulder Exercises: paddles, criss cross, circles both directions, x10 -PNF Strengthening:  red band, chest pulls, overhead pulls, er pulls, PNF up, PNF down, x10  07/09/24 -Manual Therapy: myofascial release and trigger point applied to the biceps, triceps, subscapular region, and trapezius, in order to reduce pain and fascial restrictions, as well as improve ROM.  -A/ROM: supine- protraction, flexion, er/IR, abduction, horizontal abduction, x15 -X to V arms x 15 -Goal Post arms x15 -Reassessment   PATIENT EDUCATION: Education details: Stretching Person educated: Patient Education method: Explanation, Demonstration, and Handouts Education comprehension: verbalized understanding and returned demonstration  HOME EXERCISE PROGRAM: 7/11: Table Slides and Pendulums 7/16: AA/ROM 7/31: A/ROM 8/8: Scapular Strengthening 8/26: PNF Strengthening 9/8: Shoulder Strengthening 9/16: Stretching  GOALS: Goals reviewed with patient? Yes   SHORT TERM GOALS:  Target date: 06/18/24  Pt will be provided with and educated on HEP to improve mobility in RUE required for use during ADL completion.   Goal status: MET  3.  Pt will increase RUE strength to 4-/5 to improve ability to reach for items at waist to chest height during bathing and grooming tasks.   Goal status: MET   LONG TERM GOALS: Target date: 07/10/24  Pt will decrease pain in RUE to 3/10 or less to improve ability to sleep for 2+ consecutive hours without waking due to pain.   Goal status: MET  2.  Pt will decrease RUE fascial restrictions to min amounts or less to improve mobility required for functional reaching tasks.   Goal status: MET  3.  Pt will increase RUE A/ROM by 10 degrees to improve ability to use RUE when reaching overhead or behind back during dressing and bathing tasks.   Goal status: MET  4.  Pt will increase RUE strength to 5/5 or greater to improve ability to use RUE when lifting or carrying items during meal preparation/housework/yardwork tasks.   Goal status: MET  5.  Pt will return to highest level of function using RUE as dominant during functional task completion.   Goal status: MET   ASSESSMENT:  CLINICAL IMPRESSION: Pt completed reassessment this session, where she demonstrated full ROM, overall 5/5 strength, and good functional use of her arm. She has met all OT goals and has no further skilled OT needs. Pt will be discharged from therapy at this time.   PERFORMANCE DEFICITS: in functional skills including in functional skills including ADLs, IADLs, coordination, tone, ROM, strength, pain, fascial restrictions, muscle spasms, and UE functional use.   PLAN:  OT FREQUENCY: 2x/week  OT DURATION: 6 weeks  PLANNED INTERVENTIONS: 97168 OT Re-evaluation, 97535 self care/ADL training, 02889 therapeutic exercise, 97530 therapeutic activity, 97112 neuromuscular re-education, 97140 manual therapy, 97010 moist heat, [id number]  electrical stimulation (manual),  passive range of motion, functional mobility training, energy conservation, coping strategies training, patient/family education, and DME and/or AE instructions  CONSULTED AND AGREED WITH PLAN OF CARE: Patient  PLAN FOR NEXT SESSION: follow up on HEP, manual techniques, A/ROM, functional reaching, strengthening and stability work   Valentin Nightingale, OTR/L [phone] 08/04/2024, 4:28 PM

## 2024-08-07 ENCOUNTER — Encounter (HOSPITAL_COMMUNITY): Admitting: Occupational Therapy

## 2024-08-12 ENCOUNTER — Ambulatory Visit: Payer: Self-pay | Admitting: Family Medicine

## 2024-08-12 ENCOUNTER — Ambulatory Visit: Admitting: Family Medicine

## 2024-08-12 VITALS — BP 124/76 | HR 97 | Temp 97.7°F | Ht 62.0 in | Wt 185.0 lb

## 2024-08-12 DIAGNOSIS — I10 Essential (primary) hypertension: Secondary | ICD-10-CM

## 2024-08-12 DIAGNOSIS — K219 Gastro-esophageal reflux disease without esophagitis: Secondary | ICD-10-CM

## 2024-08-12 DIAGNOSIS — E785 Hyperlipidemia, unspecified: Secondary | ICD-10-CM

## 2024-08-12 DIAGNOSIS — E1169 Type 2 diabetes mellitus with other specified complication: Secondary | ICD-10-CM | POA: Diagnosis not present

## 2024-08-12 DIAGNOSIS — Z23 Encounter for immunization: Secondary | ICD-10-CM

## 2024-08-12 DIAGNOSIS — Z79899 Other long term (current) drug therapy: Secondary | ICD-10-CM

## 2024-08-12 DIAGNOSIS — K3184 Gastroparesis: Secondary | ICD-10-CM

## 2024-08-12 DIAGNOSIS — R42 Dizziness and giddiness: Secondary | ICD-10-CM

## 2024-08-12 DIAGNOSIS — R300 Dysuria: Secondary | ICD-10-CM

## 2024-08-12 DIAGNOSIS — Z13820 Encounter for screening for osteoporosis: Secondary | ICD-10-CM

## 2024-08-12 DIAGNOSIS — R251 Tremor, unspecified: Secondary | ICD-10-CM | POA: Diagnosis not present

## 2024-08-12 DIAGNOSIS — N3000 Acute cystitis without hematuria: Secondary | ICD-10-CM | POA: Diagnosis not present

## 2024-08-12 DIAGNOSIS — E1159 Type 2 diabetes mellitus with other circulatory complications: Secondary | ICD-10-CM | POA: Diagnosis not present

## 2024-08-12 DIAGNOSIS — D649 Anemia, unspecified: Secondary | ICD-10-CM | POA: Diagnosis not present

## 2024-08-12 LAB — POCT URINALYSIS DIP (CLINITEK)
Bilirubin, UA: NEGATIVE
Blood, UA: NEGATIVE
Glucose, UA: NEGATIVE mg/dL
Ketones, POC UA: NEGATIVE mg/dL
Nitrite, UA: NEGATIVE
POC PROTEIN,UA: 30 — AB
Spec Grav, UA: >=1.03 — AB (ref 1.010–1.025)
Urobilinogen, UA: 0.2 U/dL
pH, UA: 5 (ref 5.0–8.0)

## 2024-08-12 MED ORDER — PREGABALIN 25 MG PO CAPS: 25.0000 mg | ORAL_CAPSULE | Freq: Two times a day (BID) | ORAL | 5 refills | Status: AC

## 2024-08-12 MED ORDER — CEPHALEXIN 500 MG PO CAPS: 500.0000 mg | ORAL_CAPSULE | Freq: Three times a day (TID) | ORAL | 0 refills | Status: DC

## 2024-08-12 MED ORDER — HYDROCODONE-ACETAMINOPHEN 10-325 MG PO TABS: ORAL_TABLET | ORAL | 0 refills | Status: DC

## 2024-08-12 MED ORDER — PANTOPRAZOLE SODIUM 40 MG PO TBEC: DELAYED_RELEASE_TABLET | ORAL | 0 refills | Status: AC

## 2024-08-12 MED ORDER — BUSPIRONE HCL 10 MG PO TABS: ORAL_TABLET | ORAL | 5 refills | Status: AC

## 2024-08-12 MED ORDER — VENLAFAXINE HCL 75 MG PO TABS: 75.0000 mg | ORAL_TABLET | Freq: Two times a day (BID) | ORAL | 1 refills | Status: AC

## 2024-08-12 MED ORDER — ROSUVASTATIN CALCIUM 20 MG PO TABS: 20.0000 mg | ORAL_TABLET | Freq: Every day | ORAL | 1 refills | Status: AC

## 2024-08-12 MED ORDER — CLOPIDOGREL BISULFATE 75 MG PO TABS: 75.0000 mg | ORAL_TABLET | Freq: Every day | ORAL | 1 refills | Status: AC

## 2024-08-12 NOTE — Progress Notes (Addendum)
 Subjective:    Patient ID: Jamie Harding, female    DOB: August 17, 1956, 68 y.o.   MRN: [id number]  HPI  3 month follow up pain management This patient was seen today for chronic pain  The medication list was reviewed and updated.   Location of Pain for which the patient has been treated with regarding narcotics: Significant lumbar pain as well as diabetic neuropathy  Onset of this pain: Present for years   -Compliance with medication: Good compliance  - Number patient states they take daily: 4/day  -Reason for ongoing use of opioids cannot get adequate relief with Tylenol  or NSAIDs NSAIDs not a good choice for this patient  What other measures have been tried outside of opioids through the years specialist,  In the ongoing specialists regarding this condition orthopedist  -when was the last dose patient took?  Past 24 hours  The patient was advised the importance of maintaining medication and not using illegal substances with these.  Here for refills and follow up  The patient was educated that we can provide 3 monthly scripts for their medication, it is their responsibility to follow the instructions.  Side effects or complications from medications: Denies side effects  Patient is aware that pain medications are meant to minimize the severity of the pain to allow their pain levels to improve to allow for better function. They are aware of that pain medications cannot totally remove their pain.  Due for UDT ( at least once per year) (pain management contract is also completed at the time of the UDT): December 2024  Scale of 1 to 10 ( 1 is least 10 is most) Your pain level without the medicine: 8 Your pain level with medication 3  Scale 1 to 10 ( 1-helps very little, 10 helps very well) How well does your pain medication reduce your pain so you can function better through out the day?  7  Quality of the pain: Throbbing aching  Persistence of the pain: Present all time  worse at sometimes  Modifying factors: Worse with activity She does use dicyclomine  on a intermittent basis for irritable bowel She does use her Zofran  when her gastroparesis acts up Takes her acid blocker regular basis states reflux under decent control She does relate she gets dizzy when she stands up but typically passes if she moves her legs some She does relate urinary frequency and slight dysuria under the microscope she has UTI treated with Keflex  She does take her pain medicine no more than 4/day She does take her metformin  tolerates it fairly well tries to watch her diet Does take her Lyrica  because of her diabetic neuropathy and this helps her   She does take her cholesterol medicine and tolerates it well watches her diet Does have a history of depression and anxiety under a fair amount of stress from family members patient will continue her Effexor  Patient has noticed over the past 6 to 8 weeks increased tremor in her right hand she says she has had this for years but now it is getting to the point where it makes it difficult to cut up her food feed herself and drink liquids out of a cup Concerns for uti  Review of Systems     Objective:   Physical Exam General-in no acute distress Eyes-no discharge Lungs-respiratory rate normal, CTA CV-no murmurs,RRR Extremities skin warm dry no edema Neuro grossly normal Behavior normal, alert  She does have some tremor in the right hand  minimal amount in the left hand no abnormal gait no cogwheeling      Assessment & Plan:  1. Dysuria (Primary) Urine under microscope shows WBC will go ahead and treat with antibiotics urine culture sent - POCT URINALYSIS DIP (CLINITEK) - Urine Culture  2. Gastroparesis She does take her acid reflux medicine under decent control she has been able to tolerate small amounts of food frequently keeping her weight steady - pantoprazole  (PROTONIX ) 40 MG tablet; TAKE (1) TABLET BY MOUTH TWICE A DAY  BEFORE A MEAL.  Dispense: 180 tablet; Refill: 0 - Basic Metabolic Panel  3. Gastroesophageal reflux disease, unspecified whether esophagitis present Medication [place] heartburn continue this - pantoprazole  (PROTONIX ) 40 MG tablet; TAKE (1) TABLET BY MOUTH TWICE A DAY BEFORE A MEAL.  Dispense: 180 tablet; Refill: 0 - Basic Metabolic Panel  4. Hyperlipidemia associated with type 2 diabetes mellitus (HCC) Continue statin keep LDL below 70 check lab work - Lipid Panel - Basic Metabolic Panel  5. Essential hypertension Blood pressure elevated initially but on recheck was better check lab work - Basic Metabolic Panel  6. Type 2 diabetes mellitus with vascular disease (HCC) Keep A1c under decent control continue current medication - Hemoglobin A1c - Basic Metabolic Panel  7. High risk medication use Lab ordered - Hepatic Function Panel  8. Dizziness She does have some orthostasis I have encouraged her to start using increased salt with her foods and see if this corrects it if not notify us  for now hold off on any midodrine  9. Tremor She does have tremor in her hands worse on the right side than left side no cogwheeling will go ahead with referral for neurology consult I doubt Parkinson's based on what I am seeing - Ambulatory referral to Neurology  10. Anemia, unspecified type CBC ordered await results - CBC with Differential  11. Screening for osteoporosis Bone density ordered - DG Bone Density  12. Immunization due Flu shot today - Flu vaccine HIGH DOSE PF(Fluzone Trivalent)  She does have chronic back pain and knee pain we will go ahead with her pain medication The patient was seen in followup for chronic pain. A review over at their current pain status was discussed. Drug registry was checked. Prescriptions were given.  Regular follow-up recommended. Discussion was held regarding the importance of compliance with medication as well as pain medication  contract.  Patient was informed that medication may cause drowsiness and should not be combined  with other medications/alcohol or street drugs. If the patient feels medication is causing altered alertness then do not drive or operate dangerous equipment.  Should be noted that the patient appears to be meeting appropriate use of opioids and response.  Evidenced by improved function and decent pain control without significant side effects and no evidence of overt aberrancy issues.  Upon discussion with the patient today they understand that opioid therapy is optional and they feel that the pain has been refractory to reasonable conservative measures and is significant and affecting quality of life enough to warrant ongoing therapy and wishes to continue opioids.  Refills were provided.  [place]  medical Board guidelines regarding the pain medicine has been reviewed.  CDC guidelines most updated 2022 has been reviewed by the prescriber.  PDMP is checked on a regular basis yearly urine drug screen and pain management contract  Treatment plan for this patient includes #1-gentle stretching exercises as shown daily basis 2.  Mild strength exercises 3 times per week #3 continue pain  medications #4 notify us  if any digression

## 2024-08-15 LAB — URINE CULTURE

## 2024-08-17 ENCOUNTER — Encounter: Payer: Self-pay | Admitting: Neurology

## 2024-08-19 ENCOUNTER — Ambulatory Visit (HOSPITAL_COMMUNITY)
Admission: RE | Admit: 2024-08-19 | Discharge: 2024-08-19 | Disposition: A | Discharge status: Home / Self Care | Source: Ambulatory Visit | Attending: Family Medicine | Admitting: Family Medicine

## 2024-08-19 DIAGNOSIS — M8589 Other specified disorders of bone density and structure, multiple sites: Secondary | ICD-10-CM | POA: Diagnosis not present

## 2024-08-19 DIAGNOSIS — Z13820 Encounter for screening for osteoporosis: Secondary | ICD-10-CM | POA: Insufficient documentation

## 2024-08-19 DIAGNOSIS — M85852 Other specified disorders of bone density and structure, left thigh: Secondary | ICD-10-CM | POA: Diagnosis not present

## 2024-08-19 DIAGNOSIS — Z780 Asymptomatic menopausal state: Secondary | ICD-10-CM | POA: Diagnosis not present

## 2024-08-20 ENCOUNTER — Ambulatory Visit: Admitting: Neurology

## 2024-08-20 ENCOUNTER — Encounter: Payer: Self-pay | Admitting: Neurology

## 2024-08-20 VITALS — BP 148/80 | HR 104 | Ht 62.0 in | Wt 185.6 lb

## 2024-08-20 DIAGNOSIS — G253 Myoclonus: Secondary | ICD-10-CM | POA: Diagnosis not present

## 2024-08-20 DIAGNOSIS — R251 Tremor, unspecified: Secondary | ICD-10-CM

## 2024-08-20 MED ORDER — PRIMIDONE 50 MG PO TABS: 50.0000 mg | ORAL_TABLET | Freq: Every day | ORAL | 1 refills | Status: AC

## 2024-08-20 NOTE — Progress Notes (Signed)
 Assessment/Plan:   1.  Tremor  -not much on examination today but she describes both some degree of tremor and some degree of myoclonus.  She and I discussed that Lyrica  can cause myoclonus (and she was on gabapentin  prior to that which can also cause myoclonus).  That does not necessarily mean that the medication needs to be stopped, but it can certainly be a source.  - I really did not see any significant degree of tremor on my examination today.  I did reassure her that I did not see evidence of Parkinson's disease.  She does state that she is having a good day and states that tremor can come and go.  She does desire to take something to help her when tremor is worse.  I did not want to do a beta-blocker because she is already having orthostatic symptoms when she stands.  We ultimately decided low-dose primidone, 50 mg nightly.  We discussed risks and benefits.  She was agreeable.  2.  Gait instability  - May be multifactorial.  Gait looks fairly good today, but she states that this also can come and go.  Suspected diabetic peripheral neuropathy could be a factor, as are opioids (currently taking 4 times daily).   Subjective:   Jamie Harding was seen today in the movement disorders clinic for neurologic consultation at the request of Luking, Glendia LABOR, MD.  The consultation is for the evaluation of tremor.  Pt states that tremor started a few years ago but have gotten worse with time  Tremor: Yes.     At rest or with activation?  Activation and worse in the afternoon  When is it noted the most?  Cooking/cleaning  Fam hx of tremor?  No.  Located where?  R>L (she is R handed  Affected by caffeine:  No. (2 diet pepsi's per day)  Affected by alcohol:  doesn't drink any alcohol  Affected by stress:  Yes.    Affected by fatigue:  Yes.    Spills soup if on spoon:  may/may not  Spills glass of liquid if full:  not usually but usually drinks from straw  Tremor inducing meds:  Yes.  Albuterol   (just uses seasonally)  Other Specific Symptoms:  Voice: no change Postural symptoms:  balance comes and goes   Falls?  Yes.  , unknown reason - 2 of them were walking to the mailbox; few were walking up the step from the den to the kitchen; 1 was walking to the car at granddaughters xmas play at school Bradykinesia symptoms: difficulty getting out of a chair/car; doesn't drag/shuffle feet but does sometimes feel wobbly Loss of smell:  No. Loss of taste:  No. Urinary Incontinence:  wears pad for urgency Difficulty Swallowing:  No. Trouble with ADL's:  No.  Trouble buttoning clothing: No. Depression:  sometimes and sometimes feels anxious Memory changes:  No. N/V:  No. Lightheaded:  Yes.  BP will drop if stands too quickly Diplopia:  No.  Neuroimaging of the brain has previously been performed.  She had a CT brain in July, 2025 with evidence of old infarcts in the right corona radiata and left pons.  ALLERGIES:   Allergies  Allergen Reactions   Augmentin  [Amoxicillin -Pot Clavulanate] Diarrhea   Byetta 10 Mcg Pen [Exenatide] Diarrhea and Nausea And Vomiting      touch of pancreatis   Naproxen Nausea And Vomiting   Pollen Extract Other (See Comments)    Unknown    Azithromycin Nausea  And Vomiting and Rash   Erythromycin Hives        Invokana  [Canagliflozin ] Other (See Comments)    Urinary issues,yeast infection   Morphine Hives and Rash   Sulfonamide Derivatives Nausea And Vomiting and Rash    CURRENT MEDICATIONS:  Current Outpatient Medications  Medication Instructions   acetaminophen  (TYLENOL ) 1,000 mg, Every 6 hours PRN   albuterol  (VENTOLIN  HFA) 108 (90 Base) MCG/ACT inhaler INHALE 2 PUFFS INTO THE LUNGS EVERY 4 (FOUR) HOURS AS NEEDED FOR WHEEZING.   Blood Glucose Monitoring Suppl (ONETOUCH VERIO FLEX SYSTEM) w/Device KIT USE AS DIRECTED TEST BLOOD SUGAR 4 TIMES DAILY.   busPIRone  (BUSPAR ) 10 MG tablet Take 1 tablet BID PRN   cephALEXin  (KEFLEX ) 500 mg, Oral, 3 times  daily   clopidogrel  (PLAVIX ) 75 mg, Oral, Daily with breakfast   dicyclomine  (BENTYL ) 10 mg, Oral, 3 times daily PRN   fluconazole  (DIFLUCAN ) 150 MG tablet TAKE ONE TABLET BY MOUTH ONCE FOR 1 DOSE   HYDROcodone -acetaminophen  (NORCO) 10-325 MG tablet 1 qid prn pain   HYDROcodone -acetaminophen  (NORCO) 10-325 MG tablet 1 4 times a day as needed for pain   HYDROcodone -acetaminophen  (NORCO) 10-325 MG tablet 1 taken 4 times a day as needed for pain   metFORMIN  (GLUCOPHAGE ) 1,000 mg, Oral, 2 times daily   Multiple Vitamin (MULTIVITAMIN WITH MINERALS) TABS 1 tablet, Daily   ondansetron  (ZOFRAN ) 8 MG tablet TAKE ONE TABLET BY MOUTH EVERY 8 HOURS AS NEEDED FOR NAUSEA/VOMITING.   ONETOUCH VERIO test strip USE AS DIRECTED TO TEST BLOOD GLUCOSE 4 TIMES DAILY.   pantoprazole  (PROTONIX ) 40 MG tablet TAKE (1) TABLET BY MOUTH TWICE A DAY BEFORE A MEAL.   pregabalin  (LYRICA ) 25 mg, Oral, 2 times daily   promethazine  (PHENERGAN ) 25 mg, Oral, Every 8 hours PRN, for nausea   rosuvastatin  (CRESTOR ) 20 mg, Oral, Daily   venlafaxine  (EFFEXOR ) 75-150 mg, Oral, 2 times daily with meals, 150 mg am, 75 mg pm    Objective:   PHYSICAL EXAMINATION:    VITALS:   Vitals:   08/20/24 1333  BP: (!) 148/80  Pulse: (!) 104  SpO2: 97%  Weight: 185 lb 9.6 oz (84.2 kg)  Height: 5' 2 (1.575 m)    GEN:  The patient appears stated age and is in NAD. HEENT:  Normocephalic, atraumatic.  The mucous membranes are moist. The superficial temporal arteries are without ropiness or tenderness. CV:  tachy.  regular Lungs:  CTAB Neck/HEME:  There are no carotid bruits bilaterally.  Neurological examination:  Orientation: The patient is alert and oriented x3.  Cranial nerves: There is mild decreased nasolabial fold on the right, but otherwise good facial symmetry.  Extraocular muscles are intact. The visual fields are full to confrontational testing. The speech is fluent and clear. Soft palate rises symmetrically and there is no  tongue deviation. Hearing is intact to conversational tone. Sensation: Sensation is intact to light touch throughout (facial, trunk, extremities). Vibration is intact at the bilateral big ankle. There is no extinction with double simultaneous stimulation.  Motor: Strength is 5/5 in the bilateral upper and lower extremities.   Shoulder shrug is equal and symmetric.  There is no pronator drift. Deep tendon reflexes: Deep tendon reflexes are 2/4 at the bilateral biceps, triceps, brachioradialis, patella and achilles. Plantar responses are downgoing bilaterally.  Movement examination: Tone: There is normal tone in the bilateral upper extremities.  The tone in the lower extremities is normal.  Abnormal movements: There is no rest tremor.  There  is no significant postural or intention tremor.  Archimedes spirals are drawn fairly well bilaterally.  She is able to pour water  from 1 glass to another without spilling any significant degree.  Coordination:  There is no decremation with RAM's, with any form of RAMS, including alternating supination and pronation of the forearm, hand opening and closing, finger taps, heel taps and toe taps.  Gait and Station: The patient has no difficulty arising out of a deep-seated chair without the use of the hands. The patient's stride length is good but she is wide-based.  I have reviewed and interpreted the following labs independently   Chemistry      Component Value Date/Time   NA 141 05/19/2024 1028   NA 143 04/02/2024 1027   K 3.8 05/19/2024 1028   CL 102 05/19/2024 1028   CO2 27 05/19/2024 1021   BUN 8 05/19/2024 1028   BUN 12 04/02/2024 1027   CREATININE 0.80 05/19/2024 1028   CREATININE 1.20 (H) 08/22/2022 0821      Component Value Date/Time   CALCIUM  8.6 (L) 05/19/2024 1021   ALKPHOS 62 05/19/2024 1021   AST 14 (L) 05/19/2024 1021   ALT 12 05/19/2024 1021   BILITOT 0.4 05/19/2024 1021   BILITOT 0.3 04/02/2024 1027      Lab Results  Component  Value Date   TSH 0.59 05/29/2016   Lab Results  Component Value Date   WBC 8.2 05/19/2024   HGB 12.2 05/19/2024   HCT 36.0 05/19/2024   MCV 91.6 05/19/2024   PLT 398 05/19/2024      Total time spent on today's visit was 61 minutes, including both face-to-face time and nonface-to-face time.  Time included that spent on review of records (prior notes available to me/labs/imaging if pertinent), discussing treatment and goals, answering patient's questions and coordinating care.  Cc:  Alphonsa Glendia LABOR, MD

## 2024-08-20 NOTE — Patient Instructions (Signed)
Start primidone 50 mg - 1/2 tablet at bedtime for 1 week and then increase to 1 tablet at bedtime thereafter.  The physicians and staff at [facility] are committed to providing excellent care. You may receive a survey requesting feedback about your experience at our office. We strive to receive "very good" responses to the survey questions. If you feel that your experience would prevent you from giving the office a "very good " response, please contact our office to try to remedy the situation. We may be reached at [phone]. Thank you for taking the time out of your busy day to complete the survey.  

## 2024-08-25 ENCOUNTER — Encounter: Payer: Self-pay | Admitting: Family Medicine

## 2024-08-26 ENCOUNTER — Other Ambulatory Visit: Payer: Self-pay | Admitting: Family Medicine

## 2024-08-26 MED ORDER — CEPHALEXIN 500 MG PO CAPS: 500.0000 mg | ORAL_CAPSULE | Freq: Three times a day (TID) | ORAL | 0 refills | Status: DC

## 2024-09-11 ENCOUNTER — Encounter: Payer: Self-pay | Admitting: Family Medicine

## 2024-09-12 ENCOUNTER — Other Ambulatory Visit: Payer: Self-pay | Admitting: Family Medicine

## 2024-09-12 MED ORDER — HYDROCODONE-ACETAMINOPHEN 10-325 MG PO TABS: ORAL_TABLET | ORAL | 0 refills | Status: DC

## 2024-09-14 ENCOUNTER — Ambulatory Visit (INDEPENDENT_AMBULATORY_CARE_PROVIDER_SITE_OTHER): Admitting: Physician Assistant

## 2024-09-14 VITALS — BP 114/71 | HR 82 | Temp 97.9°F | Ht 62.0 in | Wt 190.5 lb

## 2024-09-14 DIAGNOSIS — E1159 Type 2 diabetes mellitus with other circulatory complications: Secondary | ICD-10-CM | POA: Diagnosis not present

## 2024-09-14 DIAGNOSIS — E785 Hyperlipidemia, unspecified: Secondary | ICD-10-CM | POA: Diagnosis not present

## 2024-09-14 DIAGNOSIS — I10 Essential (primary) hypertension: Secondary | ICD-10-CM | POA: Diagnosis not present

## 2024-09-14 DIAGNOSIS — J0100 Acute maxillary sinusitis, unspecified: Secondary | ICD-10-CM | POA: Diagnosis not present

## 2024-09-14 DIAGNOSIS — D649 Anemia, unspecified: Secondary | ICD-10-CM | POA: Diagnosis not present

## 2024-09-14 DIAGNOSIS — K3184 Gastroparesis: Secondary | ICD-10-CM | POA: Diagnosis not present

## 2024-09-14 DIAGNOSIS — K219 Gastro-esophageal reflux disease without esophagitis: Secondary | ICD-10-CM | POA: Diagnosis not present

## 2024-09-14 DIAGNOSIS — Z79899 Other long term (current) drug therapy: Secondary | ICD-10-CM | POA: Diagnosis not present

## 2024-09-14 DIAGNOSIS — R051 Acute cough: Secondary | ICD-10-CM | POA: Diagnosis not present

## 2024-09-14 DIAGNOSIS — E1169 Type 2 diabetes mellitus with other specified complication: Secondary | ICD-10-CM | POA: Diagnosis not present

## 2024-09-14 MED ORDER — PROMETHAZINE-DM 6.25-15 MG/5ML PO SYRP: 5.0000 mL | ORAL_SOLUTION | Freq: Four times a day (QID) | ORAL | 0 refills | Status: DC | PRN

## 2024-09-14 MED ORDER — DOXYCYCLINE HYCLATE 100 MG PO TABS: 100.0000 mg | ORAL_TABLET | Freq: Two times a day (BID) | ORAL | 0 refills | Status: AC

## 2024-09-14 NOTE — Progress Notes (Signed)
 Acute Office Visit  Subjective:     Patient ID: Jamie Harding, female    DOB: [birth year], 68 y.o.   MRN: [id number]   Discussed the use of AI scribe software for clinical note transcription with the patient, who gave verbal consent to proceed.  History of Present Illness Jamie Harding is a 68 year old female who presents with worsening sinus symptoms and sore throat.  She began feeling unwell last Wednesday with general malaise and developed diarrhea, which has since resolved. By Friday morning, she experienced a sore throat and significant facial pain, particularly when touched. Her symptoms have progressively worsened over the past week.  Nasal symptoms alternate between rhinorrhea and nasal dryness, accompanied by body aches, sore throat, and sinus drainage. She uses Benadryl , DayQuil, and Flonase  without improvement.  She continues to experience pain under her eyes and occasional sharp headaches. No other family members are sick at home.   Review of Systems  Constitutional:  Positive for malaise/fatigue. Negative for fever.  HENT:  Positive for congestion, sinus pain and sore throat. Negative for ear pain.   Respiratory:  Positive for cough and sputum production. Negative for shortness of breath.   Cardiovascular:  Negative for chest pain and palpitations.  Neurological:  Positive for headaches. Negative for dizziness and sensory change.        Objective:     BP 114/71 (BP Location: Left Arm, Patient Position: Sitting)   Pulse 82   Temp 97.9 F (36.6 C)   Ht 5' 2 (1.575 m)   Wt 190 lb 8 oz (86.4 kg)   SpO2 96%   BMI 34.84 kg/m   Physical Exam Vitals reviewed.  Constitutional:      General: She is not in acute distress.    Appearance: Normal appearance. She is not ill-appearing.  HENT:     Nose: Congestion present.     Right Sinus: Maxillary sinus tenderness present. No frontal sinus tenderness.     Left Sinus: Maxillary sinus tenderness present. No frontal  sinus tenderness.     Mouth/Throat:     Mouth: Mucous membranes are moist.     Pharynx: Oropharynx is clear. Posterior oropharyngeal erythema present.  Eyes:     Extraocular Movements: Extraocular movements intact.     Conjunctiva/sclera: Conjunctivae normal.  Cardiovascular:     Rate and Rhythm: Normal rate and regular rhythm.     Heart sounds: No murmur heard. Pulmonary:     Effort: Pulmonary effort is normal.     Breath sounds: Normal breath sounds. No wheezing, rhonchi or rales.  Musculoskeletal:     Cervical back: No tenderness.  Lymphadenopathy:     Cervical: No cervical adenopathy.  Skin:    General: Skin is warm and dry.  Neurological:     General: No focal deficit present.     Mental Status: She is alert and oriented to person, place, and time.  Psychiatric:        Mood and Affect: Mood normal.        Behavior: Behavior normal.     No results found for any visits on 09/14/24.      Assessment & Plan:  Acute non-recurrent maxillary sinusitis Assessment & Plan: Presentation was consistent with sinusitis.  No evidence of other bacterial infections including pneumonia, pharyngitis, otitis media, or orbital cellulitis. Discussed that this fits the picture of viral vs bacterial sinusitis and that due to type and duration of symptoms and exam findings, we will treat as bacterial  sinusitis.  Antibiotics prescribed. Advised to continue ibuprofen and Tylenol  at home. The patient was instructed to return if the worsens in any way, especially if not tolerating fluids, increased sinus pain or swelling, worsening headache, persistent fever, difficulty swallowing or breathing, or as needed. The patient agreed with the plan.   Orders: -     Doxycycline  Hyclate; Take 1 tablet (100 mg total) by mouth 2 (two) times daily for 7 days.  Dispense: 14 tablet; Refill: 0 -     Promethazine -DM; Take 5 mLs by mouth 4 (four) times daily as needed for cough.  Dispense: 118 mL; Refill: 0  Acute  cough -     Promethazine -DM; Take 5 mLs by mouth 4 (four) times daily as needed for cough.  Dispense: 118 mL; Refill: 0    Return if symptoms worsen or fail to improve.  Charmaine Marilynne Dupuis, PA-C

## 2024-09-14 NOTE — Assessment & Plan Note (Signed)
 Presentation was consistent with sinusitis.  No evidence of other bacterial infections including pneumonia, pharyngitis, otitis media, or orbital cellulitis. Discussed that this fits the picture of viral vs bacterial sinusitis and that due to type and duration of symptoms and exam findings, we will treat as bacterial sinusitis.  Antibiotics prescribed. Advised to continue ibuprofen  and Tylenol  at home. The patient was instructed to return if the worsens in any way, especially if not tolerating fluids, increased sinus pain or swelling, worsening headache, persistent fever, difficulty swallowing or breathing, or as needed. The patient agreed with the plan.

## 2024-09-15 LAB — CBC WITH DIFFERENTIAL/PLATELET
Basophils Absolute: 0 x10E3/uL (ref 0.0–0.2)
Basos: 0 %
EOS (ABSOLUTE): 0.4 x10E3/uL (ref 0.0–0.4)
Eos: 4 %
Hematocrit: 42.7 % (ref 34.0–46.6)
Hemoglobin: 13.7 g/dL (ref 11.1–15.9)
Immature Grans (Abs): 0 x10E3/uL (ref 0.0–0.1)
Immature Granulocytes: 0 %
Lymphocytes Absolute: 3 x10E3/uL (ref 0.7–3.1)
Lymphs: 32 %
MCH: 29.3 pg (ref 26.6–33.0)
MCHC: 32.1 g/dL (ref 31.5–35.7)
MCV: 91 fL (ref 79–97)
Monocytes Absolute: 0.5 x10E3/uL (ref 0.1–0.9)
Monocytes: 5 %
Neutrophils Absolute: 5.4 x10E3/uL (ref 1.4–7.0)
Neutrophils: 59 %
Platelets: 293 x10E3/uL (ref 150–450)
RBC: 4.68 x10E6/uL (ref 3.77–5.28)
RDW: 13.6 % (ref 11.7–15.4)
WBC: 9.2 x10E3/uL (ref 3.4–10.8)

## 2024-09-15 LAB — HEPATIC FUNCTION PANEL
ALT: 12 IU/L (ref 0–32)
AST: 15 IU/L (ref 0–40)
Albumin: 4.4 g/dL (ref 3.9–4.9)
Alkaline Phosphatase: 59 IU/L (ref 49–135)
Bilirubin Total: 0.4 mg/dL (ref 0.0–1.2)
Bilirubin, Direct: 0.13 mg/dL (ref 0.00–0.40)
Total Protein: 6.8 g/dL (ref 6.0–8.5)

## 2024-09-15 LAB — LIPID PANEL
Chol/HDL Ratio: 2.4 ratio (ref 0.0–4.4)
Cholesterol, Total: 135 mg/dL (ref 100–199)
HDL: 56 mg/dL (ref >39)
LDL Chol Calc (NIH): 55 mg/dL (ref 0–99)
Triglycerides: 138 mg/dL (ref 0–149)
VLDL Cholesterol Cal: 24 mg/dL (ref 5–40)

## 2024-09-15 LAB — BASIC METABOLIC PANEL WITH GFR
BUN/Creatinine Ratio: 13 (ref 12–28)
BUN: 14 mg/dL (ref 8–27)
CO2: 24 mmol/L (ref 20–29)
Calcium: 9.4 mg/dL (ref 8.7–10.3)
Chloride: 99 mmol/L (ref 96–106)
Creatinine, Ser: 1.12 mg/dL — ABNORMAL HIGH (ref 0.57–1.00)
Glucose: 150 mg/dL — ABNORMAL HIGH (ref 70–99)
Potassium: 4.9 mmol/L (ref 3.5–5.2)
Sodium: 138 mmol/L (ref 134–144)
eGFR: 54 mL/min/1.73 — ABNORMAL LOW (ref >59)

## 2024-09-15 LAB — HEMOGLOBIN A1C
Est. average glucose Bld gHb Est-mCnc: 166 mg/dL
Hgb A1c MFr Bld: 7.4 % — ABNORMAL HIGH (ref 4.8–5.6)

## 2024-09-21 ENCOUNTER — Encounter: Payer: Self-pay | Admitting: Radiology

## 2024-09-24 ENCOUNTER — Ambulatory Visit: Admitting: Neurology

## 2024-10-01 DIAGNOSIS — E119 Type 2 diabetes mellitus without complications: Secondary | ICD-10-CM | POA: Diagnosis not present

## 2024-10-01 DIAGNOSIS — H02834 Dermatochalasis of left upper eyelid: Secondary | ICD-10-CM | POA: Diagnosis not present

## 2024-10-01 DIAGNOSIS — H02831 Dermatochalasis of right upper eyelid: Secondary | ICD-10-CM | POA: Diagnosis not present

## 2024-10-01 DIAGNOSIS — H25813 Combined forms of age-related cataract, bilateral: Secondary | ICD-10-CM | POA: Diagnosis not present

## 2024-10-20 NOTE — H&P (Signed)
 Surgical History & Physical  Patient Name: Jamie Harding  DOB: 01-Dec-1955  Surgery: Cataract extraction with intraocular lens implant phacoemulsification; Left Eye Surgeon: Lynwood Hermann MD Surgery Date: 10/26/2024 Pre-Op Date: 10/01/2024  HPI: A 72 Yr. old female patient referred from Dr. Alm Moat for a cataract evaluation in both eyes. Pt c/o blurred vision for both distance and near vision. She feels that her vision is declining and noticed no improvement with glasses. Pt also c/o poor night vision with excessive glare from headlights, and bright lights. Pt c/o decreased ability to see store signs, and street signs. This is negatively affecting the patient's quality of life and the patient is unable to function adequately in life with the current level of vision. Pt reports occasional floaters. Pt denies eye pain/irritation, FOL. HPI Completed by Dr. Lynwood Hermann  Medical History:  Arthritis Cancer Diabetes Stroke  Review of Systems Cardiovascular High Cholesterol Endocrine Type 2 Diabetic Eyes Blurred Vision Gastrointestinal GERD All recorded systems are negative except as noted above.  Social Former smoker  Alcohol Never Ocular Systemic Prednisolone-moxiflox-bromfen, Primidone , Metformin , Promethazine -DM, Clopidogrel , Hydrocodone -acetaminophen , Venlafaxine , Promethazine , Pantoprazole , Rosuvastatin , Buspirone , Pregabalin   Sx/Procedures None  Drug Allergies  Sulfa, Morphine  History & Physical: Heent: cataracts NECK: supple without bruits LUNGS: lungs clear to auscultation CV: regular rate and rhythm Abdomen: soft and non-tender  Impression & Plan: Assessment: 1. COMBINED FORMS AGE RELATED CATARACT; Both Eyes (H25.813) 2. Diabetes Type 2 No retinopathy (E11.9) 3. DERMATOCHALASIS, no surgery; Right Upper Lid, Left Upper Lid (H02.831, H02.834) 4. BLEPHARITIS; Right Upper Lid, Right Lower Lid, Left Upper Lid, Left Lower Lid (H01.001,  H01.002,H01.004,H01.005) 5. ASTIGMATISM, REGULAR; Both Eyes (H52.223)  Plan: 1. Cataract accounts for the patient's decreased vision. This visual impairment is not correctable with a tolerable change in glasses or contact lenses. Cataract surgery with an implantation of a new lens should significantly improve the visual and functional status of the patient. Recommend phacoemulsification with intraocular lens. Discussed all risks, benefits, alternatives, and potential complications. Discussed the procedures and recovery. The patient desires to have surgery. A-scan/Biometry ordered and will be performed for intraocular lens calculations. The surgery will be performed in order to improve vision for driving, reading, and for eye examinations. Recommend Dextenza  for post-operative pain and inflammation. Educational materials provided: Cataract. History of corneal refractive Surgery: None History of Previous Ocular Surgery (PPV, other): None History of ocular trauma: None Use of Eye Pressure Lowering Drops: None No current contact lens use. Pupil Status: Dilates well - shugarcaine or Lidocaine +Omidira by protocol Diabetes status: No Retinopathy Recommend Toric Lens OU Right Eye worse. OD first, then OS. Consider Panoptix Pro Toric.  2. Stressed importance of blood sugar and blood pressure control, and also yearly eye examinations. Discussed the need for ongoing proactive ocular exams and treatment, hopefully before visual symptoms develop. Diabetic correspondence sent to PCP/endocrinologist today and/or within the past year.  3. Asymptomatic, recommend observation for now. Findings, prognosis and treatment options reviewed.  4. Blepharitis is present - recommend regular lid cleaning.  5. Explained astigmatism to patient. Recommend Toric Lens OU

## 2024-10-22 ENCOUNTER — Encounter (HOSPITAL_COMMUNITY): Payer: Self-pay

## 2024-10-22 ENCOUNTER — Encounter (HOSPITAL_COMMUNITY)
Admission: RE | Admit: 2024-10-22 | Discharge: 2024-10-22 | Disposition: A | Source: Ambulatory Visit | Attending: Ophthalmology

## 2024-10-22 ENCOUNTER — Other Ambulatory Visit: Payer: Self-pay

## 2024-10-26 ENCOUNTER — Ambulatory Visit (HOSPITAL_COMMUNITY): Admitting: Anesthesiology

## 2024-10-26 ENCOUNTER — Encounter (HOSPITAL_COMMUNITY): Payer: Self-pay | Admitting: Ophthalmology

## 2024-10-26 ENCOUNTER — Encounter (HOSPITAL_COMMUNITY): Admission: RE | Disposition: A | Payer: Self-pay | Source: Home / Self Care | Attending: Ophthalmology

## 2024-10-26 ENCOUNTER — Ambulatory Visit (HOSPITAL_COMMUNITY)
Admission: RE | Admit: 2024-10-26 | Discharge: 2024-10-26 | Disposition: A | Attending: Ophthalmology | Admitting: Ophthalmology

## 2024-10-26 ENCOUNTER — Other Ambulatory Visit: Payer: Self-pay

## 2024-10-26 DIAGNOSIS — I10 Essential (primary) hypertension: Secondary | ICD-10-CM | POA: Diagnosis not present

## 2024-10-26 DIAGNOSIS — H25811 Combined forms of age-related cataract, right eye: Secondary | ICD-10-CM | POA: Diagnosis not present

## 2024-10-26 DIAGNOSIS — Z87891 Personal history of nicotine dependence: Secondary | ICD-10-CM | POA: Diagnosis not present

## 2024-10-26 HISTORY — PX: CATARACT EXTRACTION W/PHACO: SHX586

## 2024-10-26 HISTORY — PX: INSERTION, STENT, DRUG-ELUTING, LACRIMAL CANALICULUS: SHX7453

## 2024-10-26 LAB — GLUCOSE, CAPILLARY: Glucose-Capillary: 145 mg/dL — ABNORMAL HIGH (ref 70–99)

## 2024-10-26 SURGERY — PHACOEMULSIFICATION, CATARACT, WITH IOL INSERTION
Anesthesia: Monitor Anesthesia Care | Site: Eye | Laterality: Right

## 2024-10-26 MED ORDER — SODIUM HYALURONATE 10 MG/ML IO SOLUTION
PREFILLED_SYRINGE | INTRAOCULAR | Status: DC | PRN
  Administered 2024-10-26: .85 mL via INTRAOCULAR

## 2024-10-26 MED ORDER — LACTATED RINGERS IV SOLN: INTRAVENOUS | Status: DC

## 2024-10-26 MED ORDER — DEXAMETHASONE 0.4 MG OP INST
VAGINAL_INSERT | OPHTHALMIC | Status: DC | PRN
  Administered 2024-10-26: .4 mg via OPHTHALMIC

## 2024-10-26 MED ORDER — SODIUM HYALURONATE 23MG/ML IO SOSY
PREFILLED_SYRINGE | INTRAOCULAR | Status: DC | PRN
  Administered 2024-10-26: .6 mL via INTRAOCULAR

## 2024-10-26 MED ORDER — PHENYLEPHRINE-KETOROLAC 1-0.3 % IO SOLN
INTRAOCULAR | Status: DC | PRN
  Administered 2024-10-26: 500 mL via OPHTHALMIC

## 2024-10-26 MED ORDER — MIDAZOLAM HCL 5 MG/5ML IJ SOLN
INTRAMUSCULAR | Status: DC | PRN
  Administered 2024-10-26: 2 mg via INTRAVENOUS

## 2024-10-26 MED ORDER — LIDOCAINE HCL (PF) 1 % IJ SOLN
INTRAMUSCULAR | Status: DC | PRN
  Administered 2024-10-26: 1 mL

## 2024-10-26 MED ORDER — BSS IO SOLN
INTRAOCULAR | Status: DC | PRN
  Administered 2024-10-26: 15 mL via INTRAOCULAR

## 2024-10-26 MED ORDER — DEXAMETHASONE 0.4 MG OP INST
VAGINAL_INSERT | OPHTHALMIC | Status: AC
  Filled 2024-10-26: qty 1

## 2024-10-26 MED ORDER — TETRACAINE HCL 0.5 % OP SOLN
1.0000 [drp] | OPHTHALMIC | Status: AC | PRN
  Administered 2024-10-26 (×3): 1 [drp] via OPHTHALMIC

## 2024-10-26 MED ORDER — MOXIFLOXACIN HCL 5 MG/ML IO SOLN
INTRAOCULAR | Status: DC | PRN
  Administered 2024-10-26: .2 mL via INTRACAMERAL

## 2024-10-26 MED ORDER — POVIDONE-IODINE 5 % OP SOLN
OPHTHALMIC | Status: DC | PRN
  Administered 2024-10-26: 1 via OPHTHALMIC

## 2024-10-26 MED ORDER — STERILE WATER FOR IRRIGATION IR SOLN
Status: DC | PRN
  Administered 2024-10-26: 1

## 2024-10-26 MED ORDER — TROPICAMIDE 1 % OP SOLN
1.0000 [drp] | OPHTHALMIC | Status: AC | PRN
  Administered 2024-10-26 (×3): 1 [drp] via OPHTHALMIC

## 2024-10-26 MED ORDER — LIDOCAINE HCL 3.5 % OP GEL
1.0000 | Freq: Once | OPHTHALMIC | Status: AC
  Administered 2024-10-26: 1 via OPHTHALMIC

## 2024-10-26 MED ORDER — PHENYLEPHRINE HCL 2.5 % OP SOLN
1.0000 [drp] | OPHTHALMIC | Status: AC | PRN
  Administered 2024-10-26 (×3): 1 [drp] via OPHTHALMIC

## 2024-10-26 MED ORDER — MIDAZOLAM HCL 2 MG/2ML IJ SOLN
INTRAMUSCULAR | Status: AC
  Filled 2024-10-26: qty 2

## 2024-10-26 SURGICAL SUPPLY — 10 items
CLOTH BEACON ORANGE TIMEOUT ST (SAFETY) ×1 IMPLANT
EYE SHIELD UNIVERSAL CLEAR (GAUZE/BANDAGES/DRESSINGS) IMPLANT
FEE CATARACT SUITE SIGHTPATH (MISCELLANEOUS) ×1 IMPLANT
GLOVE BIOGEL PI IND STRL 7.0 (GLOVE) ×2 IMPLANT
LENS IOL TECNIS EYHANCE 24.5 (Intraocular Lens) IMPLANT
NDL HYPO 18GX1.5 BLUNT FILL (NEEDLE) ×1 IMPLANT
PAD ARMBOARD POSITIONER FOAM (MISCELLANEOUS) ×1 IMPLANT
SYR TB 1ML LL NO SAFETY (SYRINGE) ×1 IMPLANT
TAPE SURG TRANSPORE 1 IN (GAUZE/BANDAGES/DRESSINGS) IMPLANT
WATER STERILE IRR 250ML POUR (IV SOLUTION) ×1 IMPLANT

## 2024-10-26 NOTE — Op Note (Signed)
 Date of procedure: 10/26/24  Pre-operative diagnosis:  Visually significant combined form age-related cataract, Right Eye (H25.811)  Post-operative diagnosis:   1. Visually significant combined form age-related cataract, Right Eye (H25.811) 2. Pain and inflammation following cataract surgery Right Eye (H57.11)  Procedure:  Removal of cataract via phacoemulsification and insertion of intra-ocular lens [employer] DIB00 +24.5D into the capsular bag of the Right Eye 2. Placement of Dextenza  insert, Right Eye  Attending surgeon: Lynwood LABOR. Jerlisa Diliberto, MD, MA  Anesthesia: MAC, Topical Akten   Complications: None  Estimated Blood Loss: <19mL (minimal)  Specimens: None  Implants: As above  Indications:  Visually significant age-related cataract, Right Eye  Procedure:  The patient was seen and identified in the pre-operative area. The operative eye was identified and dilated.  The operative eye was marked.  Topical anesthesia was administered to the operative eye.     The patient was then to the operative suite and placed in the supine position.  A timeout was performed confirming the patient, procedure to be performed, and all other relevant information.   The patient's face was prepped and draped in the usual fashion for intra-ocular surgery.  A lid speculum was placed into the operative eye and the surgical microscope moved into place and focused.  A superotemporal paracentesis was created using a 20 gauge paracentesis blade. Omidria  was injected into the anterior chamber. Shugarcaine was injected into the anterior chamber.  Viscoelastic was injected into the anterior chamber.  A temporal clear-corneal main wound incision was created using a 2.32mm microkeratome.  A continuous curvilinear capsulorrhexis was initiated using an irrigating cystitome and completed using capsulorrhexis forceps.  Hydrodissection and hydrodeliniation were performed.  Viscoelastic was injected into the anterior  chamber.  A phacoemulsification handpiece and a chopper as a second instrument were used to remove the nucleus and epinucleus. The irrigation/aspiration handpiece was used to remove any remaining cortical material.   The capsular bag was reinflated with viscoelastic, checked, and found to be intact.  The intraocular lens was inserted into the capsular bag.  The irrigation/aspiration handpiece was used to remove any remaining viscoelastic.  The clear corneal wound and paracentesis wounds were then hydrated and checked with Weck-Cels to be watertight. 0.1mL of moxifloxacin  was injected into the anterior chamber.  The lid-speculum was removed. The lower punctum was dilated. A Dextenza  implant was placed in the lower canaliculus without complication.  The drape was removed.  The patient's face was cleaned with a wet and dry 4x4. A clear shield was taped over the eye. The patient was taken to the post-operative care unit in good condition, having tolerated the procedure well.  Post-Op Instructions: The patient will follow up at [facility] for a same day post-operative evaluation and will receive all other orders and instructions.

## 2024-10-26 NOTE — Discharge Instructions (Signed)
 Please discharge patient when stable, will follow up today with Dr. June Leap at the [facility] office immediately following discharge.  Leave shield in place until visit.  All paperwork with discharge instructions will be given at the office.  [facility] Address:  [address]  [place], [place] [address]

## 2024-10-26 NOTE — Anesthesia Preprocedure Evaluation (Signed)
 Anesthesia Evaluation  Patient identified by MRN, date of birth, ID band Patient awake    Reviewed: Allergy & Precautions, H&P , NPO status , Patient's Chart, lab work & pertinent test results, reviewed documented beta blocker date and time   Airway Mallampati: II  TM Distance: >3 FB Neck ROM: full    Dental no notable dental hx.    Pulmonary asthma , former smoker   Pulmonary exam normal breath sounds clear to auscultation       Cardiovascular Exercise Tolerance: Good hypertension,  Rhythm:regular Rate:Normal     Neuro/Psych  PSYCHIATRIC DISORDERS Anxiety Depression     Neuromuscular disease CVA    GI/Hepatic Neg liver ROS, hiatal hernia,GERD  ,,  Endo/Other  diabetes    Renal/GU Renal disease  negative genitourinary   Musculoskeletal   Abdominal   Peds  Hematology  (+) Blood dyscrasia, anemia   Anesthesia Other Findings   Reproductive/Obstetrics negative OB ROS                              Anesthesia Physical Anesthesia Plan  ASA: 3  Anesthesia Plan: MAC   Post-op Pain Management:    Induction:   PONV Risk Score and Plan:   Airway Management Planned:   Additional Equipment:   Intra-op Plan:   Post-operative Plan:   Informed Consent: I have reviewed the patients History and Physical, chart, labs and discussed the procedure including the risks, benefits and alternatives for the proposed anesthesia with the patient or authorized representative who has indicated his/her understanding and acceptance.     Dental Advisory Given  Plan Discussed with: CRNA  Anesthesia Plan Comments:         Anesthesia Quick Evaluation

## 2024-10-26 NOTE — Transfer of Care (Signed)
 Immediate Anesthesia Transfer of Care Note  Patient: Jamie Harding  Procedure(s) Performed: PHACOEMULSIFICATION, CATARACT, WITH IOL INSERTION (Right: Eye) INSERTION, STENT, DRUG-ELUTING, LACRIMAL CANALICULUS (Right: Eye)  Patient Location: Short Stay  Anesthesia Type:MAC  Level of Consciousness: awake  Airway & Oxygen  Therapy: Patient Spontanous Breathing  Post-op Assessment: Report given to RN  Post vital signs: Reviewed and stable  Last Vitals:  Vitals Value Taken Time  BP    Temp    Pulse    Resp    SpO2      Last Pain:  Vitals:   10/26/24 0910  TempSrc: Oral  PainSc: 0-No pain         Complications: There were no known notable events for this encounter.

## 2024-10-26 NOTE — Anesthesia Postprocedure Evaluation (Signed)
 Anesthesia Post Note  Patient: Giliana A Limburg  Procedure(s) Performed: PHACOEMULSIFICATION, CATARACT, WITH IOL INSERTION (Right: Eye) INSERTION, STENT, DRUG-ELUTING, LACRIMAL CANALICULUS (Right: Eye)  Patient location during evaluation: Phase II Anesthesia Type: MAC Level of consciousness: awake Pain management: pain level controlled Vital Signs Assessment: post-procedure vital signs reviewed and stable Respiratory status: spontaneous breathing and respiratory function stable Cardiovascular status: blood pressure returned to baseline and stable Postop Assessment: no headache and no apparent nausea or vomiting Anesthetic complications: no Comments: Late entry   There were no known notable events for this encounter.   Last Vitals:  Vitals:   10/26/24 0910 10/26/24 1006  BP: (!) 141/84 139/69  Pulse: 79 85  Resp: 14 15  Temp: 36.8 C 37.2 C  SpO2: 99% 97%    Last Pain:  Vitals:   10/26/24 1006  TempSrc: Oral  PainSc: 0-No pain                 Yvonna JINNY Bosworth

## 2024-10-26 NOTE — Interval H&P Note (Signed)
 History and Physical Interval Note:  10/26/2024 9:44 AM  Jamie Harding  has presented today for surgery, with the diagnosis of combined forms age related cataract, right eye.  The various methods of treatment have been discussed with the patient and family. After consideration of risks, benefits and other options for treatment, the patient has consented to  Procedure(s): PHACOEMULSIFICATION, CATARACT, WITH IOL INSERTION (Right) INSERTION, STENT, DRUG-ELUTING, LACRIMAL CANALICULUS (Right) as a surgical intervention.  The patient's history has been reviewed, patient examined, no change in status, stable for surgery.  I have reviewed the patient's chart and labs.  Questions were answered to the patient's satisfaction.     HARRIE AGENT

## 2024-10-27 ENCOUNTER — Ambulatory Visit: Admitting: Orthopedic Surgery

## 2024-10-27 ENCOUNTER — Encounter (HOSPITAL_COMMUNITY): Payer: Self-pay | Admitting: Ophthalmology

## 2024-11-04 ENCOUNTER — Other Ambulatory Visit: Payer: Self-pay

## 2024-11-04 ENCOUNTER — Encounter (HOSPITAL_COMMUNITY): Payer: Self-pay

## 2024-11-04 ENCOUNTER — Ambulatory Visit: Admitting: Orthopedic Surgery

## 2024-11-04 ENCOUNTER — Encounter (HOSPITAL_COMMUNITY): Admission: RE | Admit: 2024-11-04 | Discharge: 2024-11-04 | Attending: Ophthalmology

## 2024-11-04 ENCOUNTER — Encounter: Payer: Self-pay | Admitting: Orthopedic Surgery

## 2024-11-04 DIAGNOSIS — Z96611 Presence of right artificial shoulder joint: Secondary | ICD-10-CM

## 2024-11-04 DIAGNOSIS — M19019 Primary osteoarthritis, unspecified shoulder: Secondary | ICD-10-CM

## 2024-11-04 DIAGNOSIS — M19011 Primary osteoarthritis, right shoulder: Secondary | ICD-10-CM

## 2024-11-04 DIAGNOSIS — M65321 Trigger finger, right index finger: Secondary | ICD-10-CM

## 2024-11-04 MED ORDER — METHYLPREDNISOLONE ACETATE 40 MG/ML IJ SUSP
40.0000 mg | Freq: Once | INTRAMUSCULAR | Status: AC
  Administered 2024-11-04: 14:00:00 40 mg via INTRA_ARTICULAR

## 2024-11-04 NOTE — Patient Instructions (Signed)

## 2024-11-04 NOTE — H&P (Signed)
 Surgical History & Physical  Patient Name: Jamie Harding  DOB: 04/19/1956  Surgery: Cataract extraction with intraocular lens implant phacoemulsification; Left Eye Surgeon: Lynwood Hermann MD Surgery Date: 11/09/2024 Pre-Op Date: 11/02/2024  HPI: A 60 Yr. old female patient 1. The patient is returning for a cataract follow-up of the right eye. Since the last visit, the affected area is doing well. The patient's vision is improved. The condition's severity is constant. Patient is following medication instructions. Patient also returns for cataract in the left eye. The patient has blurry vision with is constant and worsening. She has trouble with glare at night when driving. There is also a large difference between the two eyes. Pt. is ready to proceed with cataract surgery on OS, due to blurred vision, which is negatively affecting the patient's quality of life and the patient is unable to function adequately in life with the current level of vision. HPI was performed by Lynwood Hermann .  Medical History:  Arthritis Cancer Diabetes Stroke  Review of Systems Cardiovascular High Cholesterol Endocrine Type 2 Diabetic Eyes Blurred Vision Gastrointestinal GERD All recorded systems are negative except as noted above.  Social Former smoker  Alcohol Never Medication  Ocular Systemic Primidone , Metformin , Promethazine -DM, Clopidogrel , Hydrocodone -acetaminophen , Venlafaxine , Promethazine , Pantoprazole , Rosuvastatin , Buspirone , Pregabalin   Sx/Procedures Phaco c IOL OD  Drug Allergies  Sulfa, Morphine  History & Physical: Heent: cataract NECK: supple without bruits LUNGS: lungs clear to auscultation CV: regular rate and rhythm Abdomen: soft and non-tender  Impression & Plan: Assessment: 1. CATARACT EXTRACTION STATUS; Right Eye (Z98.41) 2. COMBINED FORMS AGE RELATED CATARACT; Left Eye (H25.812)  Plan: 1. 1 week after cataract surgery. Doing well with improved vision and normal eye  pressure. Call with any problems or concerns. Continue Pred-Mox-Brom Combo drop 2x/day for 1 more week.  2. Cataract accounts for the patient's decreased vision. This visual impairment is not correctable with a tolerable change in glasses or contact lenses. Cataract surgery with an implantation of a new lens should significantly improve the visual and functional status of the patient. Recommend phacoemulsification with intraocular lens. Discussed all risks, benefits, alternatives, and potential complications. Discussed the procedures and recovery. The patient desires to have surgery. A-scan/Biometry ordered and will be performed for intraocular lens calculations. The surgery will be performed in order to improve vision for driving, reading, and for eye examinations. Recommend Dextenza  for post-operative pain and inflammation. Educational materials provided: Cataract. History of corneal refractive Surgery: None History of Previous Ocular Surgery (PPV, other): None History of ocular trauma: None Use of Eye Pressure Lowering Drops: None No current contact lens use. Pupil Status: Dilates well - shugarcaine or Lidocaine +Omidira by protocol left eye.

## 2024-11-04 NOTE — Progress Notes (Signed)
 Orthopaedic Postop Note  Assessment: Jamie Harding is a 68 y.o. female s/p Right Reverse Shoulder Arthroplasty  DOS: 05/01/2024  Plan: Jamie Harding shoulder is doing well.  She does not have pain.  She she feels a little bit of tightness with motion, but has very good motion overall.  No issues with the incision.  She thinks that she will do better if she does her exercises on a more consistent basis.  I think this is reasonable.  I would like see her back in approximately 6 months.  She is also dealing with a trigger finger of the index finger.  She has had multiple procedures for trigger finger on the same hand.  Symptoms have been ongoing for couple of weeks.  She is interested in an injection.  Procedure note injection - Right Index finger  A1 Pulley  Verbal consent was obtained to inject the Right Index finger  A1 pulley Timeout was completed to confirm the site of injection.  The skin was prepped with alcohol and ethyl chloride was sprayed at the injection site.  A 21-gauge needle was used to inject 40 mg of Depo-Medrol  and 1% lidocaine  (1 cc) into the Right Index finger  using a direct anterior approach.  There were no complications. Patient tolerated the procedure well. A sterile bandage was applied   Follow-up: Return in about 6 months (around 05/05/2025).  XR at next visit: Right shoulder  Subjective:  Chief Complaint  Patient presents with   Post-op Follow-up    Right shoulder / improved but has some pain at night if she sleeps on the right shoulder Reverse shoulder replacement 05/01/24    History of Present Illness: Jamie Harding is a 68 y.o. female who presents following the above stated procedure.  Surgery was approximately 6 months ago.  She is doing well.  She is not having any pain.  She notes some stiffness, with certain motions.  She has not been diligent with her exercises.  She states that she should resume the exercises, as these have been helping her  shoulder.  In addition, she notes a triggering sensation in the right index finger.  She has had multiple trigger finger releases on the right hand.  She is interested in an injection for the right index finger.   Review of Systems: No fevers or chills No numbness or tingling No Chest Pain No shortness of breath   Objective: There were no vitals taken for this visit.  Physical Exam:  Alert and oriented, no acute distress  Right shoulder with a well-healed surgical incision.  Sensation intact in the axillary nerve distribution.  160 degrees of forward flexion.  Internal rotation to the back pocket.  External rotation is similar to the contralateral side.  Fingers are warm and well-perfused.  IMAGING: I personally ordered and reviewed the following images:  X-rays of the right shoulder were obtained in clinic today.  No acute injuries are noted.  Well-positioned reverse shoulder arthroplasty.  No subsidence.  No lucency around the prostheses.  No dislocation.  Impression: Stable right reverse shoulder arthroplasty  [name], MD 11/04/2024 2:02 PM

## 2024-11-04 NOTE — Progress Notes (Signed)
 Attempted pre op call. Left VM to callus back.

## 2024-11-05 ENCOUNTER — Telehealth: Payer: Self-pay

## 2024-11-05 DIAGNOSIS — E119 Type 2 diabetes mellitus without complications: Secondary | ICD-10-CM

## 2024-11-05 NOTE — Progress Notes (Signed)
 Pharmacy Quality Measure Review  This patient is appearing on a report for being at risk of failing the adherence measure for diabetes medications this calendar year.   Medication: Metformin  1000mg  Last fill date: 10/17 for 90 day supply  States that she went without this medication surrounding her surgeries. Otherwise is doing well and refilling. All questions were answered.  Halden Phegley, PharmD [facility] Pharmacist

## 2024-11-09 ENCOUNTER — Encounter: Admission: RE | Payer: Self-pay

## 2024-11-09 ENCOUNTER — Ambulatory Visit (HOSPITAL_COMMUNITY)
Admission: RE | Admit: 2024-11-09 | Discharge: 2024-11-09 | Disposition: A | Discharge status: Home / Self Care | Attending: Ophthalmology | Admitting: Ophthalmology

## 2024-11-09 ENCOUNTER — Ambulatory Visit (HOSPITAL_COMMUNITY): Admitting: Anesthesiology

## 2024-11-09 DIAGNOSIS — K219 Gastro-esophageal reflux disease without esophagitis: Secondary | ICD-10-CM | POA: Insufficient documentation

## 2024-11-09 DIAGNOSIS — Z87891 Personal history of nicotine dependence: Secondary | ICD-10-CM | POA: Diagnosis not present

## 2024-11-09 DIAGNOSIS — H5712 Ocular pain, left eye: Secondary | ICD-10-CM | POA: Diagnosis present

## 2024-11-09 DIAGNOSIS — E1136 Type 2 diabetes mellitus with diabetic cataract: Secondary | ICD-10-CM | POA: Diagnosis not present

## 2024-11-09 DIAGNOSIS — I10 Essential (primary) hypertension: Secondary | ICD-10-CM

## 2024-11-09 DIAGNOSIS — J45909 Unspecified asthma, uncomplicated: Secondary | ICD-10-CM | POA: Insufficient documentation

## 2024-11-09 DIAGNOSIS — H25812 Combined forms of age-related cataract, left eye: Secondary | ICD-10-CM | POA: Insufficient documentation

## 2024-11-09 HISTORY — PX: CATARACT EXTRACTION W/PHACO: SHX586

## 2024-11-09 HISTORY — PX: INSERTION, STENT, DRUG-ELUTING, LACRIMAL CANALICULUS: SHX7453

## 2024-11-09 LAB — GLUCOSE, CAPILLARY: Glucose-Capillary: 170 mg/dL — ABNORMAL HIGH (ref 70–99)

## 2024-11-09 SURGERY — PHACOEMULSIFICATION, CATARACT, WITH IOL INSERTION
Anesthesia: Monitor Anesthesia Care | Site: Eye | Laterality: Left

## 2024-11-09 MED ORDER — MIDAZOLAM HCL 5 MG/5ML IJ SOLN
INTRAMUSCULAR | Status: DC | PRN
  Administered 2024-11-09: 2 mg via INTRAVENOUS

## 2024-11-09 MED ORDER — TETRACAINE HCL 0.5 % OP SOLN
1.0000 [drp] | OPHTHALMIC | Status: AC | PRN
  Administered 2024-11-09 (×3): 1 [drp] via OPHTHALMIC

## 2024-11-09 MED ORDER — PHENYLEPHRINE-KETOROLAC 1-0.3 % IO SOLN
INTRAOCULAR | Status: DC | PRN
  Administered 2024-11-09: 500 mL via OPHTHALMIC

## 2024-11-09 MED ORDER — TROPICAMIDE 1 % OP SOLN
1.0000 [drp] | OPHTHALMIC | Status: AC | PRN
  Administered 2024-11-09 (×3): 1 [drp] via OPHTHALMIC

## 2024-11-09 MED ORDER — LACTATED RINGERS IV SOLN: INTRAVENOUS | Status: DC

## 2024-11-09 MED ORDER — MOXIFLOXACIN HCL 5 MG/ML IO SOLN
INTRAOCULAR | Status: DC | PRN
  Administered 2024-11-09: .3 mL via INTRACAMERAL

## 2024-11-09 MED ORDER — STERILE WATER FOR IRRIGATION IR SOLN
Status: DC | PRN
  Administered 2024-11-09: 1

## 2024-11-09 MED ORDER — MIDAZOLAM HCL 2 MG/2ML IJ SOLN
INTRAMUSCULAR | Status: AC
  Filled 2024-11-09: qty 2

## 2024-11-09 MED ORDER — LIDOCAINE HCL (PF) 1 % IJ SOLN
INTRAMUSCULAR | Status: DC | PRN
  Administered 2024-11-09: 1 mL

## 2024-11-09 MED ORDER — DEXAMETHASONE 0.4 MG OP INST
VAGINAL_INSERT | OPHTHALMIC | Status: DC | PRN
  Administered 2024-11-09: .4 mg via OPHTHALMIC

## 2024-11-09 MED ORDER — LIDOCAINE HCL 3.5 % OP GEL: 1.0000 | Freq: Once | OPHTHALMIC | Status: DC

## 2024-11-09 MED ORDER — SODIUM HYALURONATE 23MG/ML IO SOSY
PREFILLED_SYRINGE | INTRAOCULAR | Status: DC | PRN
  Administered 2024-11-09: .6 mL via INTRAOCULAR

## 2024-11-09 MED ORDER — POVIDONE-IODINE 5 % OP SOLN
OPHTHALMIC | Status: DC | PRN
  Administered 2024-11-09: 1 via OPHTHALMIC

## 2024-11-09 MED ORDER — PHENYLEPHRINE HCL 2.5 % OP SOLN
1.0000 [drp] | OPHTHALMIC | Status: AC | PRN
  Administered 2024-11-09 (×3): 1 [drp] via OPHTHALMIC

## 2024-11-09 MED ORDER — SODIUM HYALURONATE 10 MG/ML IO SOLUTION
PREFILLED_SYRINGE | INTRAOCULAR | Status: DC | PRN
  Administered 2024-11-09: .85 mL via INTRAOCULAR

## 2024-11-09 MED ORDER — BSS IO SOLN
INTRAOCULAR | Status: DC | PRN
  Administered 2024-11-09: 15 mL via INTRAOCULAR

## 2024-11-09 MED ORDER — DEXAMETHASONE 0.4 MG OP INST
VAGINAL_INSERT | OPHTHALMIC | Status: AC
  Filled 2024-11-09: qty 1

## 2024-11-09 SURGICAL SUPPLY — 11 items
CLOTH BEACON ORANGE TIMEOUT ST (SAFETY) ×2 IMPLANT
EYE SHIELD UNIVERSAL CLEAR (GAUZE/BANDAGES/DRESSINGS) IMPLANT
FEE CATARACT SUITE SIGHTPATH (MISCELLANEOUS) ×2 IMPLANT
GLOVE BIOGEL PI IND STRL 7.0 (GLOVE) ×4 IMPLANT
LENS IOL TECNIS EYHANCE 25.0 (Intraocular Lens) IMPLANT
NDL HYPO 18GX1.5 BLUNT FILL (NEEDLE) ×2 IMPLANT
NEEDLE HYPO 18GX1.5 BLUNT FILL (NEEDLE) ×2 IMPLANT
PAD ARMBOARD POSITIONER FOAM (MISCELLANEOUS) ×2 IMPLANT
SYR TB 1ML LL NO SAFETY (SYRINGE) ×2 IMPLANT
TAPE SURG TRANSPORE 1 IN (GAUZE/BANDAGES/DRESSINGS) IMPLANT
WATER STERILE IRR 250ML POUR (IV SOLUTION) ×2 IMPLANT

## 2024-11-09 NOTE — Discharge Instructions (Addendum)
 Please discharge patient when stable, will follow up today with Dr. June Leap at the [facility] office immediately following discharge.  Leave shield in place until visit.  All paperwork with discharge instructions will be given at the office.  [facility] Address:  [address]  [place], [place] [address]

## 2024-11-09 NOTE — Transfer of Care (Signed)
 Immediate Anesthesia Transfer of Care Note  Patient: Jamie Harding  Procedure(s) Performed: PHACOEMULSIFICATION, CATARACT, WITH IOL INSERTION (Left: Eye) INSERTION, STENT, DRUG-ELUTING, LACRIMAL CANALICULUS (Left)  Patient Location: PACU  Anesthesia Type:MAC  Level of Consciousness: awake, alert , oriented, and patient cooperative  Airway & Oxygen  Therapy: Patient Spontanous Breathing  Post-op Assessment: Report given to RN, Post -op Vital signs reviewed and stable, and Patient moving all extremities X 4  Post vital signs: Reviewed and stable  Last Vitals:  Vitals Value Taken Time  BP 182/88 11/09/24 10:53  Temp 37.1 C 11/09/24 10:53  Pulse 82 11/09/24 10:53  Resp 20 11/09/24 10:53  SpO2 98 % 11/09/24 10:53    Last Pain:  Vitals:   11/09/24 1053  TempSrc: Oral  PainSc: 0-No pain      Patients Stated Pain Goal: 7 (11/09/24 0951)  Complications: No notable events documented.

## 2024-11-09 NOTE — Interval H&P Note (Signed)
 History and Physical Interval Note:  11/09/2024 10:22 AM  Jamie Harding  has presented today for surgery, with the diagnosis of combined forms age related cataract, left eye.  The various methods of treatment have been discussed with the patient and family. After consideration of risks, benefits and other options for treatment, the patient has consented to  Procedures: PHACOEMULSIFICATION, CATARACT, WITH IOL INSERTION (Left) INSERTION, STENT, DRUG-ELUTING, LACRIMAL CANALICULUS (Left) as a surgical intervention.  The patient's history has been reviewed, patient examined, no change in status, stable for surgery.  I have reviewed the patient's chart and labs.  Questions were answered to the patient's satisfaction.     HARRIE AGENT

## 2024-11-09 NOTE — Anesthesia Preprocedure Evaluation (Signed)
 Anesthesia Evaluation  Patient identified by MRN, date of birth, ID band Patient awake    Reviewed: Allergy & Precautions, H&P , NPO status , Patient's Chart, lab work & pertinent test results, reviewed documented beta blocker date and time   Airway Mallampati: II  TM Distance: >3 FB Neck ROM: full    Dental no notable dental hx.    Pulmonary asthma , former smoker   Pulmonary exam normal breath sounds clear to auscultation       Cardiovascular Exercise Tolerance: Good hypertension,  Rhythm:regular Rate:Normal     Neuro/Psych  PSYCHIATRIC DISORDERS Anxiety Depression     Neuromuscular disease CVA    GI/Hepatic Neg liver ROS, hiatal hernia,GERD  ,,  Endo/Other  diabetes    Renal/GU Renal disease  negative genitourinary   Musculoskeletal   Abdominal   Peds  Hematology  (+) Blood dyscrasia, anemia   Anesthesia Other Findings   Reproductive/Obstetrics negative OB ROS                              Anesthesia Physical Anesthesia Plan  ASA: 3  Anesthesia Plan: MAC   Post-op Pain Management:    Induction:   PONV Risk Score and Plan:   Airway Management Planned:   Additional Equipment:   Intra-op Plan:   Post-operative Plan:   Informed Consent: I have reviewed the patients History and Physical, chart, labs and discussed the procedure including the risks, benefits and alternatives for the proposed anesthesia with the patient or authorized representative who has indicated his/her understanding and acceptance.     Dental Advisory Given  Plan Discussed with: CRNA  Anesthesia Plan Comments:         Anesthesia Quick Evaluation

## 2024-11-09 NOTE — Op Note (Signed)
 Date of procedure: 11/09/2024  Pre-operative diagnosis: Visually significant age-related combined cataract, Left Eye (H25.812)  Post-operative diagnosis:  Visually significant age-related combined cataract, Left Eye (H25.812) 2.   Pain and inflammation following cataract surgery, Left Eye (H57.12)  Procedure:  Removal of cataract via phacoemulsification and insertion of intra-ocular lens [employer] DIB00 +25.0D into the capsular bag of the Left Eye 2. Placement of Dextenza  Implant, Left Lower Lid  Attending surgeon: Lynwood LABOR. Amari Burnsworth, MD, MA  Anesthesia: MAC, Topical Akten   Complications: None  Estimated Blood Loss: <78mL (minimal)  Specimens: None  Implants: As above  Indications:  Visually significant age-related cataract, Left Eye  Procedure:  The patient was seen and identified in the pre-operative area. The operative eye was identified and dilated.  The operative eye was marked.  Topical anesthesia was administered to the operative eye.     The patient was then to the operative suite and placed in the supine position.  A timeout was performed confirming the patient, procedure to be performed, and all other relevant information.   The patient's face was prepped and draped in the usual fashion for intra-ocular surgery.  A lid speculum was placed into the operative eye and the surgical microscope moved into place and focused.  An inferotemporal paracentesis was created using a 20 gauge paracentesis blade. Omidria  was injected into the anterior chamber. Shugarcaine was injected into the anterior chamber.  Viscoelastic was injected into the anterior chamber.  A temporal clear-corneal main wound incision was created using a 2.22mm microkeratome.  A continuous curvilinear capsulorrhexis was initiated using an irrigating cystitome and completed using capsulorrhexis forceps.  Hydrodissection and hydrodeliniation were performed.  Viscoelastic was injected into the anterior chamber.  A  phacoemulsification handpiece and a chopper as a second instrument were used to remove the nucleus and epinucleus. The irrigation/aspiration handpiece was used to remove any remaining cortical material.   The capsular bag was reinflated with viscoelastic, checked, and found to be intact.  The intraocular lens was inserted into the capsular bag.  The irrigation/aspiration handpiece was used to remove any remaining viscoelastic.  The clear corneal wound and paracentesis wounds were then hydrated and checked with Weck-Cels to be watertight.  0.1mL of moxifloxacin  was injected into the anterior chamber.  The lid-speculum was removed. The lower punctum was dilated. A Dextenza  implant was placed in the lower canaliculus without complication.   The drape was removed.  The patient's face was cleaned with a wet and dry 4x4.   A clear shield was taped over the eye. The patient was taken to the post-operative care unit in good condition, having tolerated the procedure well.  Post-Op Instructions: The patient will follow up at [facility] for a same day post-operative evaluation and will receive all other orders and instructions.

## 2024-11-10 ENCOUNTER — Encounter (HOSPITAL_COMMUNITY): Payer: Self-pay | Admitting: Ophthalmology

## 2024-11-13 NOTE — Anesthesia Postprocedure Evaluation (Signed)
"   Anesthesia Post Note  Patient: Jamie Harding  Procedure(s) Performed: PHACOEMULSIFICATION, CATARACT, WITH IOL INSERTION (Left: Eye) INSERTION, STENT, DRUG-ELUTING, LACRIMAL CANALICULUS (Left)  Patient location during evaluation: Phase II Anesthesia Type: MAC Level of consciousness: awake Pain management: pain level controlled Vital Signs Assessment: post-procedure vital signs reviewed and stable Respiratory status: spontaneous breathing and respiratory function stable Cardiovascular status: blood pressure returned to baseline and stable Postop Assessment: no headache and no apparent nausea or vomiting Anesthetic complications: no Comments: Late entry   No notable events documented.   Last Vitals:  Vitals:   11/09/24 0957 11/09/24 1053  BP: (!) 168/80 (!) 182/88  Pulse: 76 82  Resp: 20 20  Temp:  37.1 C  SpO2: 97% 98%    Last Pain:  Vitals:   11/09/24 1053  TempSrc: Oral  PainSc: 0-No pain                 Yvonna PARAS Merrissa Giacobbe      "

## 2024-11-16 ENCOUNTER — Encounter: Payer: Self-pay | Admitting: Family Medicine

## 2024-11-16 ENCOUNTER — Ambulatory Visit: Admitting: Family Medicine

## 2024-11-16 DIAGNOSIS — L659 Nonscarring hair loss, unspecified: Secondary | ICD-10-CM | POA: Diagnosis not present

## 2024-11-16 DIAGNOSIS — K3184 Gastroparesis: Secondary | ICD-10-CM | POA: Diagnosis not present

## 2024-11-16 DIAGNOSIS — R300 Dysuria: Secondary | ICD-10-CM

## 2024-11-16 DIAGNOSIS — Z8673 Personal history of transient ischemic attack (TIA), and cerebral infarction without residual deficits: Secondary | ICD-10-CM | POA: Diagnosis not present

## 2024-11-16 DIAGNOSIS — Z7984 Long term (current) use of oral hypoglycemic drugs: Secondary | ICD-10-CM

## 2024-11-16 DIAGNOSIS — K760 Fatty (change of) liver, not elsewhere classified: Secondary | ICD-10-CM

## 2024-11-16 DIAGNOSIS — E785 Hyperlipidemia, unspecified: Secondary | ICD-10-CM | POA: Diagnosis not present

## 2024-11-16 DIAGNOSIS — E1159 Type 2 diabetes mellitus with other circulatory complications: Secondary | ICD-10-CM

## 2024-11-16 DIAGNOSIS — E1169 Type 2 diabetes mellitus with other specified complication: Secondary | ICD-10-CM

## 2024-11-16 DIAGNOSIS — Z79899 Other long term (current) drug therapy: Secondary | ICD-10-CM

## 2024-11-16 DIAGNOSIS — Z79891 Long term (current) use of opiate analgesic: Secondary | ICD-10-CM

## 2024-11-16 MED ORDER — HYDROCODONE-ACETAMINOPHEN 10-325 MG PO TABS: ORAL_TABLET | ORAL | 0 refills | Status: DC

## 2024-11-16 NOTE — Progress Notes (Signed)
" ° °  Subjective:    Patient ID: Jamie Harding, female    DOB: 27-Jan-1956, 68 y.o.   MRN: [id number]  HPI Pain management Sore throat and vomiting / w/ gastroparesis  This patient was seen today for chronic pain  The medication list was reviewed and updated.   Location of Pain for which the patient has been treated with regarding narcotics: Lumbar  Onset of this pain: Present for years   -Compliance with medication: Good compliance  - Number patient states they take daily: Approximately 4/day  -Reason for ongoing use of opioids cannot get adequate relief with Tylenol  or NSAIDs  What other measures have been tried outside of opioids Tylenol  NSAIDs  In the ongoing specialists regarding this condition none currently  -when was the last dose patient took?  Past 24 hours  The patient was advised the importance of maintaining medication and not using illegal substances with these.  Here for refills and follow up  The patient was educated that we can provide 3 monthly scripts for their medication, it is their responsibility to follow the instructions.  Side effects or complications from medications: Denies side effects  Patient is aware that pain medications are meant to minimize the severity of the pain to allow their pain levels to improve to allow for better function. They are aware of that pain medications cannot totally remove their pain.  Due for UDT ( at least once per year) (pain management contract is also completed at the time of the UDT): Today  Scale of 1 to 10 ( 1 is least 10 is most) Your pain level without the medicine: 8 Your pain level with medication 4  Scale 1 to 10 ( 1-helps very little, 10 helps very well) How well does your pain medication reduce your pain so you can function better through out the day?  7-8  Quality of the pain: Throbbing aching  Persistence of the pain: Present all time  Modifying factors: Worse with activity  She relates  gastroparesis giving her some difficulties but not severe She has occasional regurgitation She states her diet has not been as healthy as it should be over the past several weeks She does try to stay physically active       Review of Systems     Objective:   Physical Exam General-in no acute distress Eyes-no discharge Lungs-respiratory rate normal, CTA CV-no murmurs,RRR Extremities skin warm dry no edema Neuro grossly normal Behavior normal, alert        Assessment & Plan:  1. Gastroparesis Small meals frequently emphasize protein  2. Type 2 diabetes mellitus with vascular disease (HCC) Cut back on carbohydrates Stay active Continue medications - Microalbumin/Creatinine Ratio, Urine - Hemoglobin A1c - Basic metabolic panel with GFR  3. Fatty liver Check lab work  4. Hyperlipidemia associated with type 2 diabetes mellitus (HCC) Check labs goal LDL below 70 if possible below 55 - Hemoglobin A1c - Lipid Panel  5. History of CVA (cerebrovascular accident) without residual deficits Keep risk factors under good control  6. Long-term current use of opiate analgesic Today - ToxASSURE Select 13 (MW), Urine  7. Hair loss Patient with significant intermittent hair loss throughout the whole scalp which she wonders if it may be stress related check thyroid  function - TSH + free T4  8. High risk medication use Lab ordered - Hepatic function panel  9. Dysuria Patient states off-and-on dysuria check urine culture - Urine Culture   "

## 2024-11-17 LAB — MED LIST OPTION NOT SELECTED

## 2024-11-19 ENCOUNTER — Other Ambulatory Visit: Payer: Self-pay | Admitting: Family Medicine

## 2024-11-19 ENCOUNTER — Ambulatory Visit: Payer: Self-pay | Admitting: Family Medicine

## 2024-11-19 LAB — TOXASSURE SELECT 13 (MW), URINE

## 2024-11-19 LAB — SPECIMEN STATUS REPORT

## 2024-11-19 MED ORDER — CIPROFLOXACIN HCL 500 MG PO TABS: ORAL_TABLET | ORAL | 0 refills | Status: DC

## 2024-11-20 LAB — URINE CULTURE

## 2024-11-20 LAB — SPECIMEN STATUS REPORT

## 2024-12-10 ENCOUNTER — Other Ambulatory Visit: Payer: Self-pay | Admitting: Family Medicine

## 2024-12-10 ENCOUNTER — Telehealth: Payer: Self-pay | Admitting: Family Medicine

## 2024-12-10 MED ORDER — HYDROCODONE-ACETAMINOPHEN 10-325 MG PO TABS: ORAL_TABLET | ORAL | 0 refills | Status: AC

## 2024-12-10 NOTE — Telephone Encounter (Signed)
 I received a request for refills on her hydrocodone  She did do a pain visit in December Apparently the prescription did not go through hard to understand but nonetheless we sent in 3 new prescription PDMP was checked She has a follow-up visit in April

## 2024-12-15 ENCOUNTER — Ambulatory Visit: Admitting: Family Medicine

## 2024-12-21 ENCOUNTER — Encounter: Payer: Self-pay | Admitting: Family Medicine

## 2024-12-22 ENCOUNTER — Other Ambulatory Visit: Payer: Self-pay | Admitting: Nurse Practitioner

## 2024-12-22 MED ORDER — CIPROFLOXACIN HCL 500 MG PO TABS: ORAL_TABLET | ORAL | 0 refills | Status: AC

## 2025-03-10 ENCOUNTER — Ambulatory Visit: Admitting: Family Medicine

## 2025-05-05 ENCOUNTER — Ambulatory Visit: Admitting: Orthopedic Surgery

## 2025-06-09 ENCOUNTER — Ambulatory Visit: Admitting: Family Medicine
